# Patient Record
Sex: Male | Born: 1960 | Race: Black or African American | Hispanic: No | Marital: Married | Smoking: Never smoker
Health system: Southern US, Community
[De-identification: ages and names within clinical notes are randomized; demographics above are authoritative.]

## PROBLEM LIST (undated history)

## (undated) DIAGNOSIS — G20 Parkinson's disease: Secondary | ICD-10-CM

## (undated) DIAGNOSIS — R112 Nausea with vomiting, unspecified: Secondary | ICD-10-CM

## (undated) DIAGNOSIS — Z9889 Other specified postprocedural states: Secondary | ICD-10-CM

## (undated) DIAGNOSIS — Z87442 Personal history of urinary calculi: Secondary | ICD-10-CM

## (undated) DIAGNOSIS — F419 Anxiety disorder, unspecified: Secondary | ICD-10-CM

## (undated) HISTORY — PX: COLONOSCOPY: SHX174

## (undated) HISTORY — PX: UPPER GASTROINTESTINAL ENDOSCOPY: SHX188

## (undated) HISTORY — PX: OTHER SURGICAL HISTORY: SHX169

## (undated) HISTORY — DX: Parkinson's disease: G20

---

## 2013-09-10 DIAGNOSIS — G20A1 Parkinson's disease without dyskinesia, without mention of fluctuations: Secondary | ICD-10-CM

## 2013-09-10 DIAGNOSIS — G20 Parkinson's disease: Secondary | ICD-10-CM

## 2013-09-10 HISTORY — DX: Parkinson's disease: G20

## 2013-09-10 HISTORY — DX: Parkinson's disease without dyskinesia, without mention of fluctuations: G20.A1

## 2013-09-11 ENCOUNTER — Ambulatory Visit: Attending: Neurology | Admitting: Physical Therapy

## 2013-09-11 DIAGNOSIS — IMO0001 Reserved for inherently not codable concepts without codable children: Secondary | ICD-10-CM | POA: Insufficient documentation

## 2013-09-11 DIAGNOSIS — R269 Unspecified abnormalities of gait and mobility: Secondary | ICD-10-CM | POA: Insufficient documentation

## 2013-09-16 ENCOUNTER — Ambulatory Visit: Admitting: Physical Therapy

## 2013-09-18 ENCOUNTER — Ambulatory Visit: Attending: Neurology | Admitting: Physical Therapy

## 2013-09-18 DIAGNOSIS — R269 Unspecified abnormalities of gait and mobility: Secondary | ICD-10-CM | POA: Insufficient documentation

## 2013-09-18 DIAGNOSIS — IMO0001 Reserved for inherently not codable concepts without codable children: Secondary | ICD-10-CM | POA: Insufficient documentation

## 2013-09-19 ENCOUNTER — Ambulatory Visit: Admitting: Physical Therapy

## 2013-09-23 ENCOUNTER — Ambulatory Visit: Admitting: Occupational Therapy

## 2013-09-23 ENCOUNTER — Ambulatory Visit: Admitting: Physical Therapy

## 2013-09-24 ENCOUNTER — Ambulatory Visit: Admitting: Physical Therapy

## 2013-09-24 ENCOUNTER — Ambulatory Visit: Admitting: Occupational Therapy

## 2013-09-25 ENCOUNTER — Ambulatory Visit: Admitting: Physical Therapy

## 2013-09-26 ENCOUNTER — Ambulatory Visit: Admitting: Occupational Therapy

## 2013-09-27 ENCOUNTER — Ambulatory Visit: Admitting: Physical Therapy

## 2013-09-30 ENCOUNTER — Ambulatory Visit: Admitting: Physical Therapy

## 2013-10-01 ENCOUNTER — Ambulatory Visit: Admitting: Rehabilitative and Restorative Service Providers"

## 2013-10-02 ENCOUNTER — Ambulatory Visit: Admitting: Physical Therapy

## 2013-10-02 ENCOUNTER — Other Ambulatory Visit: Payer: Self-pay

## 2013-10-02 ENCOUNTER — Ambulatory Visit: Admitting: Occupational Therapy

## 2013-10-03 ENCOUNTER — Ambulatory Visit: Admitting: Physical Therapy

## 2013-10-04 ENCOUNTER — Ambulatory Visit: Admitting: Occupational Therapy

## 2013-10-07 ENCOUNTER — Ambulatory Visit: Admitting: Occupational Therapy

## 2013-10-07 ENCOUNTER — Ambulatory Visit: Admitting: Physical Therapy

## 2013-10-09 ENCOUNTER — Ambulatory Visit: Admitting: Physical Therapy

## 2013-10-09 ENCOUNTER — Ambulatory Visit: Admitting: Occupational Therapy

## 2013-10-10 ENCOUNTER — Ambulatory Visit: Admitting: Physical Therapy

## 2013-10-10 ENCOUNTER — Ambulatory Visit: Admitting: Occupational Therapy

## 2013-10-11 ENCOUNTER — Ambulatory Visit: Admitting: Physical Therapy

## 2013-10-14 ENCOUNTER — Ambulatory Visit: Admitting: Occupational Therapy

## 2013-10-14 ENCOUNTER — Ambulatory Visit: Admitting: Physical Therapy

## 2013-10-16 ENCOUNTER — Ambulatory Visit: Admitting: Occupational Therapy

## 2013-10-16 ENCOUNTER — Ambulatory Visit: Admitting: Physical Therapy

## 2013-10-18 ENCOUNTER — Encounter: Payer: Self-pay | Admitting: Neurology

## 2013-10-18 ENCOUNTER — Ambulatory Visit (INDEPENDENT_AMBULATORY_CARE_PROVIDER_SITE_OTHER): Admitting: Neurology

## 2013-10-18 ENCOUNTER — Ambulatory Visit: Admitting: Physical Therapy

## 2013-10-18 ENCOUNTER — Ambulatory Visit: Admitting: Occupational Therapy

## 2013-10-18 VITALS — BP 122/78 | HR 80 | Temp 98.3°F | Resp 12 | Ht 74.0 in | Wt 174.4 lb

## 2013-10-18 DIAGNOSIS — G20A1 Parkinson's disease without dyskinesia, without mention of fluctuations: Secondary | ICD-10-CM

## 2013-10-18 DIAGNOSIS — G20 Parkinson's disease: Secondary | ICD-10-CM | POA: Insufficient documentation

## 2013-10-18 DIAGNOSIS — R259 Unspecified abnormal involuntary movements: Secondary | ICD-10-CM

## 2013-10-18 DIAGNOSIS — R251 Tremor, unspecified: Secondary | ICD-10-CM

## 2013-10-18 MED ORDER — CARBIDOPA-LEVODOPA 25-100 MG PO TABS: 1.0000 | ORAL_TABLET | Freq: Three times a day (TID) | ORAL | Status: DC

## 2013-10-18 NOTE — Progress Notes (Signed)
Ian [name] was seen today in the movement disorders clinic for neurologic consultation at the request of his PCP, Dr. Drue Second.  He has previously seen  [facility] MD and saw [name] in [place].   This patient is accompanied in the office by his spouse who supplements the history.The consultation is for the evaluation of PD.  I did review Dr. [name] notes.  Dr. Hurley Cisco notes are not available to me.    Hist first sx was tremor was R arm tremor.  It was in 2011 according to the patient today, but Dr. [name] notes indicate that perhaps this was in 2009.  He was dx with enhanced physiologic tremor.  He was started on propranolol with some help, but made him sluggish.  He went to see [name] in 2013 for an opinion in [place].  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  He was placed on baclofen and requip.  He thinks that the requip has helped; he is able to turn over in the bed better and move better.  The pt first saw Dr. [name] on 09/10/13.  Dr. [name] agreed with the Requip and start the patient on [name], but the patient is currently only on its once per day.  Specific Symptoms:  Tremor: yes, R arm x 3 years, L foot x 1 year Voice: hoarse with nervousness Sleep: sleeps well  Vivid Dreams:  yes  Acting out dreams:  no Wet Pillows: no Postural symptoms:  no  Falls?  no Bradykinesia symptoms: shuffling gait and slow movements Loss of smell:  no Loss of taste:  no Urinary Incontinence:  no Difficulty Swallowing:  no Handwriting, micrographia: no Trouble with ADL's:  yes  Trouble buttoning clothing: yes Depression:  yes (some in past, but better now) Memory changes:  no Hallucinations:  no  visual distortions: no N/V:  no Lightheaded:  no  Syncope: no Diplopia:  no Dyskinesia:  no  Neuroimaging has previously been performed.  It is not  available for my review today.  I did ask the patient to get me.  PREVIOUS MEDICATIONS: Requip and neupro (tried few days only); propranolol helped tremor some  ALLERGIES:  No Known Allergies  CURRENT MEDICATIONS:     Medication List       This list is accurate as of: 10/18/13  4:54 PM.  Always use your most recent med list.               baclofen 10 MG tablet  Commonly known as:  LIORESAL  10 mg.     carbidopa-levodopa 25-100 MG per tablet  Commonly known as:  SINEMET  Take 1 tablet by mouth 3 (three) times daily.     Co Q-10 100 MG Caps  100 mg.     D-5000 5000 UNITS Tabs  Generic drug:  Cholecalciferol  1 tablet.     MULTI-VITAMINS Tabs  1 tablet.     rOPINIRole 0.25 MG tablet  Commonly known as:  REQUIP  6 mg daily.     valACYclovir 1000 MG tablet  Commonly known as:  VALTREX  1,000 mg.         PAST MEDICAL HISTORY:   Past Medical History  Diagnosis Date  . PD (Parkinson's disease) 09/10/2013    PAST SURGICAL HISTORY:   Past Surgical History  Procedure Laterality Date  . None      SOCIAL HISTORY:  History   Social History  . Marital Status: Married    Spouse Name: N/A    Number of Children: N/A  . Years of Education: N/A   Occupational History  . Not on file.   Social History Main Topics  . Smoking status: Never Smoker   . Smokeless tobacco: Never Used  . Alcohol Use: Yes     Comment: Occ  . Drug Use: Not on file  . Sexual Activity: Not on file   Other Topics Concern  . Not on file   Social History Narrative  . No narrative on file    FAMILY HISTORY:   Family Status  Relation Status Death Age  . Mother Deceased 53    Breast Cancer  . Father Alive     Diabetes  . Sister Alive     healthy  . Daughter Alive     2, twins  . Son Alive     ROS:  -15-20 lb weight loss over last few years.  A complete 10 system review of systems was obtained and was unremarkable apart from what is mentioned above.  PHYSICAL  EXAMINATION:    VITALS:   Filed Vitals:   10/18/13 1419  BP: 122/78  Pulse: 80  Temp: 98.3 F (36.8 C)  Resp: 12  Height: 6\' 2"  (1.88 m)  Weight: 174 lb 6.4 oz (79.107 kg)    GEN:  The patient appears stated age and is in NAD. HEENT:  Normocephalic, atraumatic.  The mucous membranes are moist. The superficial temporal arteries are without ropiness or tenderness. CV:  RRR Lungs:  CTAB Neck/HEME:  There are no carotid bruits bilaterally.  Neurological examination:  Orientation: The patient is alert and oriented x3. Fund of knowledge is appropriate.  Recent and remote memory are intact.  Attention and concentration are normal.    Able to name objects and repeat phrases. Cranial nerves: There is good facial symmetry.  There is facial hypomimia.  Pupils are equal round and reactive to light bilaterally. Fundoscopic exam reveals clear margins bilaterally. Extraocular muscles are intact. The visual fields are full to confrontational testing. The speech is fluent and clear. Soft palate rises symmetrically and there is no tongue deviation. Hearing is intact to conversational tone. Sensation: Sensation is intact to light and pinprick throughout (facial, trunk, extremities). Vibration is intact at the bilateral big toe. There is no extinction with double simultaneous stimulation. There is no sensory dermatomal level identified. Motor: Strength is 5/5 in the bilateral upper and lower extremities.   Shoulder shrug is equal and symmetric.  There is no pronator drift. Deep tendon reflexes: Deep tendon reflexes are 2-/4 at the bilateral biceps, triceps, brachioradialis, patella and achilles. Plantar responses are downgoing bilaterally.  Movement examination: Tone: There is moderate to severe rigidity in the upper extremities, left greater than right.  Tone was moderately increased in the bilateral lower extremities.  Abnormal movements: There is a near constant resting tremor of the right upper  extremity.  There is a fairly consistent tremor of the left lower extremity an intermittent resting tremor of the left upper extremity. Coordination:  There is  decremation with RAM's, seen primarily in the upper extremities with hand opening and closing and finger taps.  The lower extremities were good. Gait and Station: The patient has minimal difficulty arising out of a deep-seated chair without the use of the hands. The patient's stride length is fairly good (patient is currently in rehabilitation and states that he has worked on  this).  There is decreased arm swing bilaterally.  The patient has a negative pull test.      ASSESSMENT/PLAN:  1.  Parkinsonism.  I suspect that this does represent idiopathic Parkinson's disease.  The patient has tremor, bradykinesia, rigidity and mild postural instability.  -We discussed the diagnosis as well as pathophysiology of the disease.  We discussed treatment options as well as prognostic indicators.  Patient education was provided.  -Greater than 50% of the 80 minute visit was spent in counseling answering questions and talking about what to expect now as well as in the future.  We talked about medication options as well as potential future surgical options.  We talked about safety in the home.  -I discussed with him that I agree with Dr. [name] approach and I think that he needs an increase the dose of levodopa.  I will switch him to the immediate release formulation and have him take it 30 minutes before each meal.  Risks, benefits, side effects and alternative therapies were discussed.  The opportunity to ask questions was given and they were answered to the best of my ability.  The patient expressed understanding and willingness to follow the outlined treatment protocols.  -I did tell the pt that I think that he will likely need a higher levodopa dose that this but it is a good starting dose.  -The patient will remain on [name], 6 mg daily.  -If we're  able to get some of this rigidity under better control then perhaps we can use a pure anticholinergic medication like Artane for tremor.  -I stressed the importance of exercise.  -I. asked the patient to get me a copy of his MRI of the brain from [place].

## 2013-10-21 ENCOUNTER — Ambulatory Visit: Attending: Neurology | Admitting: Physical Therapy

## 2013-10-21 ENCOUNTER — Ambulatory Visit: Admitting: Occupational Therapy

## 2013-10-21 DIAGNOSIS — R269 Unspecified abnormalities of gait and mobility: Secondary | ICD-10-CM | POA: Insufficient documentation

## 2013-10-21 DIAGNOSIS — IMO0001 Reserved for inherently not codable concepts without codable children: Secondary | ICD-10-CM | POA: Insufficient documentation

## 2013-10-23 ENCOUNTER — Ambulatory Visit: Admitting: Physical Therapy

## 2013-10-23 ENCOUNTER — Ambulatory Visit: Admitting: Occupational Therapy

## 2013-10-24 ENCOUNTER — Ambulatory Visit: Admitting: Occupational Therapy

## 2013-10-24 ENCOUNTER — Ambulatory Visit: Admitting: Physical Therapy

## 2013-10-28 ENCOUNTER — Ambulatory Visit: Admitting: Occupational Therapy

## 2013-10-28 ENCOUNTER — Ambulatory Visit: Admitting: Physical Therapy

## 2013-10-30 ENCOUNTER — Ambulatory Visit: Admitting: Physical Therapy

## 2013-10-30 ENCOUNTER — Ambulatory Visit: Admitting: Occupational Therapy

## 2013-10-30 ENCOUNTER — Encounter: Admitting: Occupational Therapy

## 2013-10-31 ENCOUNTER — Ambulatory Visit: Admitting: Physical Therapy

## 2013-10-31 ENCOUNTER — Ambulatory Visit: Admitting: Occupational Therapy

## 2013-11-05 ENCOUNTER — Ambulatory Visit: Admitting: Occupational Therapy

## 2013-11-05 ENCOUNTER — Ambulatory Visit: Admitting: Physical Therapy

## 2013-11-07 ENCOUNTER — Ambulatory Visit: Admitting: Physical Therapy

## 2013-11-11 ENCOUNTER — Ambulatory Visit: Admitting: Physical Therapy

## 2013-11-12 ENCOUNTER — Ambulatory Visit: Admitting: Occupational Therapy

## 2013-11-18 ENCOUNTER — Ambulatory Visit: Attending: Neurology | Admitting: Occupational Therapy

## 2013-11-18 DIAGNOSIS — R269 Unspecified abnormalities of gait and mobility: Secondary | ICD-10-CM | POA: Insufficient documentation

## 2013-11-18 DIAGNOSIS — IMO0001 Reserved for inherently not codable concepts without codable children: Secondary | ICD-10-CM | POA: Insufficient documentation

## 2013-11-21 ENCOUNTER — Ambulatory Visit: Admitting: Occupational Therapy

## 2013-12-04 ENCOUNTER — Ambulatory Visit (INDEPENDENT_AMBULATORY_CARE_PROVIDER_SITE_OTHER): Admitting: Neurology

## 2013-12-04 ENCOUNTER — Encounter: Payer: Self-pay | Admitting: Neurology

## 2013-12-04 VITALS — BP 118/70 | HR 80 | Temp 98.2°F | Resp 16 | Ht 74.0 in | Wt 174.8 lb

## 2013-12-04 DIAGNOSIS — G20 Parkinson's disease: Secondary | ICD-10-CM

## 2013-12-04 MED ORDER — CARBIDOPA-LEVODOPA 25-100 MG PO TABS: 2.0000 | ORAL_TABLET | Freq: Three times a day (TID) | ORAL | Status: DC

## 2013-12-04 NOTE — Progress Notes (Signed)
Ian [name] was seen today in the movement disorders clinic for neurologic consultation at the request of his PCP, Dr. Drue Second.  He has previously seen  [facility], MD and saw [name] in [place].   This patient is accompanied in the office by his spouse who supplements the history.The consultation is for the evaluation of PD.  I did review Dr. [name] notes.  Dr. Hurley Cisco notes are not available to me.    Hist first sx was tremor was R arm tremor.  It was in 2011 according to the patient today, but Dr. [name] notes indicate that perhaps this was in 2009.  He was dx with enhanced physiologic tremor.  He was started on propranolol with some help, but made him sluggish.  He went to see [name] in 2013 for an opinion in [place].  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  He was placed on baclofen and requip.  He thinks that the requip has helped; he is able to turn over in the bed better and move better.  The pt first saw Dr. [name] on 09/10/13.  Dr. [name] agreed with the Requip and start the patient on [name], but the patient is currently only on its once per day.  12/04/13 update:  The pt presents to the clinic today for his PD.  He is exercising faithfully.  He attends the PD exercise class through the rehab center. The pt was changed to the IR formulation of carbidopa/levodopa 25/100 last visit and is on it tid.  He is not sure it helps.  Afternoons are better than mornings.  carbidopa/levodopa 25/100 is taken at 8am/3:30 pm and bedtime. He is also on requip xl 6 mg daily.  Continues to have significant tremor.   Neuroimaging has previously been performed.  It is not available for my review today.  I did ask the patient to get me.  PREVIOUS MEDICATIONS: Requip and neupro (tried few days only); propranolol helped tremor some  ALLERGIES:  No Known  Allergies  CURRENT MEDICATIONS:     Medication List       This list is accurate as of: 12/04/13  4:10 PM.  Always use your most recent med list.               carbidopa-levodopa 25-100 MG per tablet  Commonly known as:  SINEMET  Take 1 tablet by mouth 3 (three) times daily.     CITICOLINE SODIUM PO  Take by mouth.     Co Q-10 100 MG Caps  100 mg.     D-5000 5000 UNITS Tabs  Generic drug:  Cholecalciferol  1 tablet.     MULTI-VITAMINS Tabs  1 tablet.     niacin 500 MG tablet  Take 5,000 mg by mouth at bedtime.     valACYclovir 1000 MG tablet  Commonly known as:  VALTREX  1,000 mg.     vitamin E 400 UNIT capsule  Take 400 Units by mouth 3 (three) times daily.         PAST MEDICAL HISTORY:   Past Medical History  Diagnosis Date  . PD (Parkinson's disease) 09/10/2013    PAST SURGICAL HISTORY:   Past Surgical History  Procedure Laterality Date  . None      SOCIAL HISTORY:   History   Social History  . Marital Status: Married    Spouse Name: N/A  Number of Children: N/A  . Years of Education: N/A   Occupational History  . Not on file.   Social History Main Topics  . Smoking status: Never Smoker   . Smokeless tobacco: Never Used  . Alcohol Use: Yes     Comment: Occ  . Drug Use: Not on file  . Sexual Activity: Not on file   Other Topics Concern  . Not on file   Social History Narrative  . No narrative on file    FAMILY HISTORY:   Family Status  Relation Status Death Age  . Mother Deceased 18    Breast Cancer  . Father Alive     Diabetes  . Sister Alive     healthy  . Daughter Alive     2, twins  . Son Alive     ROS:  -15-20 lb weight loss over last few years. Stable from last visit.  A complete 10 system review of systems was obtained and was unremarkable apart from what is mentioned above.  PHYSICAL EXAMINATION:    VITALS:   Filed Vitals:   12/04/13 1523  BP: 118/70  Pulse: 80  Temp: 98.2 F (36.8 C)  Resp: 16   Height: 6\' 2"  (1.88 m)  Weight: 174 lb 12.8 oz (79.289 kg)   Wt Readings from Last 3 Encounters:  12/04/13 174 lb 12.8 oz (79.289 kg)  10/18/13 174 lb 6.4 oz (79.107 kg)    GEN:  The patient appears stated age and is in NAD. HEENT:  Normocephalic, atraumatic.  The mucous membranes are moist. The superficial temporal arteries are without ropiness or tenderness. CV:  RRR Lungs:  CTAB Neck/HEME:  There are no carotid bruits bilaterally.  Neurological examination:  Orientation: The patient is alert and oriented x3. Fund of knowledge is appropriate.  Recent and remote memory are intact.  Attention and concentration are normal.    Able to name objects and repeat phrases. Cranial nerves: There is good facial symmetry.  There is facial hypomimia.  Pupils are equal round and reactive to light bilaterally. The speech is fluent and clear. Soft palate rises symmetrically and there is no tongue deviation. Hearing is intact to conversational tone. Sensation: Sensation is intact to light touch throughout. Motor: Strength is 5/5 in the bilateral upper and lower extremities.   Shoulder shrug is equal and symmetric.  There is no pronator drift.   Movement examination: Tone: There is moderate to severe rigidity in the upper extremities, left greater than right.  Tone was moderately increased in the bilateral lower extremities.  Abnormal movements: There is an intermittent resting tremor of the right upper extremity.  There is a fairly consistent tremor of the left lower extremity Coordination:  There is  decremation with RAM's, seen primarily with toe taps/heel taps today bilaterally.  The lower extremities were good. Gait and Station: The patient has no difficulty arising out of a deep-seated chair without the use of the hands. The patient's stride length is fairly good (patient is currently in rehabilitation and states that he has worked on this).  There is decreased arm swing bilaterally.  The patient  has a negative pull test.      ASSESSMENT/PLAN:  1. idiopathic Parkinson's disease.  The patient has tremor, bradykinesia, rigidity and mild postural instability.  -We discussed the diagnosis as well as pathophysiology of the disease.  We discussed treatment options as well as prognostic indicators.  Patient education was provided.  -Levodopa will be increased to 2 tablets  tid and dosages will be moved closer together.  I would like to see him next visit not long after a dose is taken.  -The patient will remain on [name], 6 mg daily.  -If we're able to get some of this rigidity under better control then perhaps we can use a pure anticholinergic medication like Artane for tremor.  -I stressed the importance of exercise.   He is doing this faithfully.  -DBS was again discussed.  -Handicap placard was filled out.

## 2013-12-04 NOTE — Patient Instructions (Signed)
1.  Increase your carbidopa/levodopa 25/100 as follows:  2 at 8am, 1 at noon, 1 at 5 pm (prior to meals) for a week, then 2 at 8am, 2 at noon, 1 at 5 pm for a week and then 2 tablets three times per day thereafter

## 2014-01-27 ENCOUNTER — Encounter: Payer: Self-pay | Admitting: Neurology

## 2014-01-27 ENCOUNTER — Ambulatory Visit (INDEPENDENT_AMBULATORY_CARE_PROVIDER_SITE_OTHER): Admitting: Neurology

## 2014-01-27 VITALS — BP 110/78 | HR 72 | Temp 97.9°F | Ht 74.02 in | Wt 173.2 lb

## 2014-01-27 DIAGNOSIS — G4752 REM sleep behavior disorder: Secondary | ICD-10-CM

## 2014-01-27 DIAGNOSIS — G20 Parkinson's disease: Secondary | ICD-10-CM

## 2014-01-27 MED ORDER — ROPINIROLE HCL ER 4 MG PO TB24: 4.0000 mg | ORAL_TABLET | Freq: Two times a day (BID) | ORAL | Status: DC

## 2014-01-27 NOTE — Progress Notes (Signed)
Ian [name] was seen today in the movement disorders clinic for neurologic consultation at the request of his PCP, Dr. Drue Second.  He has previously seen  [facility], MD and saw [name] in [place].   This patient is accompanied in the office by his spouse who supplements the history.The consultation is for the evaluation of PD.  I did review Dr. [name] notes.  Dr. Hurley Cisco notes are not available to me.    Hist first sx was tremor was R arm tremor.  It was in 2011 according to the patient today, but Dr. [name] notes indicate that perhaps this was in 2009.  He was dx with enhanced physiologic tremor.  He was started on propranolol with some help, but made him sluggish.  He went to see [name] in 2013 for an opinion in [place].  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  He was placed on baclofen and requip.  He thinks that the requip has helped; he is able to turn over in the bed better and move better.  The pt first saw Dr. [name] on 09/10/13.  Dr. [name] agreed with the Requip and start the patient on [name], but the patient is currently only on its once per day.  12/04/13 update:  The pt presents to the clinic today for his PD.  He is exercising faithfully.  He attends the PD exercise class through the rehab center. The pt was changed to the IR formulation of carbidopa/levodopa 25/100 last visit and is on it tid.  He is not sure it helps.  Afternoons are better than mornings.  carbidopa/levodopa 25/100 is taken at 8am/3:30 pm and bedtime. He is also on requip xl 6 mg daily.  Continues to have significant tremor.  01/27/14 update:  The patient presents today with his wife, who supplements the history.  The pt has a hx of PD and last visit, we decided to increase his carbidopa/levodopa to 2 po tid.  the patient states that he just felt lightheaded on this medication  dosage and up going back to [name] 3 times per day and then takes [name], half tablet twice per day. He remains on requip xl 6 mg daily.  Overall, he does feel fairly good.  He still has tremor.  He went to see Dr. [name] since our last visit and again talked about DBS therapy.  Dr. [name] mentioned to him that he looks much better and he had previously and had [name] score had declined.  Some yelling out in the sleep but this has not been a huge issue.  No sleepwalking/talking.  No falls.  He is exercising faithfully.   Neuroimaging has previously been performed.  It is not available for my review today.  I did ask the patient to get me.  PREVIOUS MEDICATIONS: Requip and neupro (tried few days only); propranolol helped tremor some  ALLERGIES:  No Known Allergies  CURRENT MEDICATIONS:     Medication List       This list is accurate as of: 01/27/14 12:57 PM.  Always use your most recent med list.               carbidopa-levodopa 25-100 MG per tablet  Commonly known as:  SINEMET  Take 2 tablets by mouth 3 (three) times daily.     CITICOLINE SODIUM PO  Take by mouth.     Co  Q-10 100 MG Caps  100 mg.     D-5000 5000 UNITS Tabs  Generic drug:  Cholecalciferol  1 tablet.     MULTI-VITAMINS Tabs  1 tablet.     niacin 500 MG tablet  Take 5,000 mg by mouth at bedtime.     rOPINIRole 2 MG 24 hr tablet  Commonly known as:  [name]  Take 6 mg by mouth at bedtime.     valACYclovir 1000 MG tablet  Commonly known as:  VALTREX  1,000 mg.     vitamin E 400 UNIT capsule  Take 400 Units by mouth 3 (three) times daily.         PAST MEDICAL HISTORY:   Past Medical History  Diagnosis Date  . PD (Parkinson's disease) 09/10/2013    PAST SURGICAL HISTORY:   Past Surgical History  Procedure Laterality Date  . None      SOCIAL HISTORY:   History   Social History  . Marital Status: Married    Spouse Name: N/A    Number of  Children: N/A  . Years of Education: N/A   Occupational History  . Not on file.   Social History Main Topics  . Smoking status: Never Smoker   . Smokeless tobacco: Never Used  . Alcohol Use: Yes     Comment: Occ  . Drug Use: Not on file  . Sexual Activity: Not on file   Other Topics Concern  . Not on file   Social History Narrative  . No narrative on file    FAMILY HISTORY:   Family Status  Relation Status Death Age  . Mother Deceased 1857    Breast Cancer  . Father Alive     Diabetes  . Sister Alive     healthy  . Daughter Alive     2, twins  . Son Alive     ROS:  -15-20 lb weight loss over last few years. Stable from last visit.  A complete 10 system review of systems was obtained and was unremarkable apart from what is mentioned above.  PHYSICAL EXAMINATION:    VITALS:   There were no vitals filed for this visit. Wt Readings from Last 3 Encounters:  12/04/13 174 lb 12.8 oz (79.289 kg)  10/18/13 174 lb 6.4 oz (79.107 kg)    GEN:  The patient appears stated age and is in NAD. HEENT:  Normocephalic, atraumatic.  The mucous membranes are moist. The superficial temporal arteries are without ropiness or tenderness. CV:  RRR Lungs:  CTAB Neck/HEME:  There are no carotid bruits bilaterally.  Neurological examination:  Orientation: The patient is alert and oriented x3. Fund of knowledge is appropriate.  Recent and remote memory are intact.  Attention and concentration are normal.    Able to name objects and repeat phrases. Cranial nerves: There is good facial symmetry.  There is facial hypomimia.  Pupils are equal round and reactive to light bilaterally. The speech is fluent and clear. Soft palate rises symmetrically and there is no tongue deviation. Hearing is intact to conversational tone. Sensation: Sensation is intact to light touch throughout. Motor: Strength is 5/5 in the bilateral upper and lower extremities.   Shoulder shrug is equal and symmetric.  There is  no pronator drift.   Movement examination: Tone: There is mild to moderate rigidity bilaterally.  This is actually a great improvement.  Tone in the lower extremities was only mildly increased, again an improvement.   Abnormal movements: There is  an intermittent resting tremor bilaterally. Coordination:  There is  decremation with RAM's, seen primarily with toe taps/heel taps today bilaterally.  The lower extremities were good. Gait and Station: The patient has no difficulty arising out of a deep-seated chair without the use of the hands. The patient's stride length is fairly good There is decreased arm swing bilaterally.  The patient has a negative pull test.      ASSESSMENT/PLAN:  1. idiopathic Parkinson's disease.  The patient has tremor, bradykinesia, rigidity and mild postural instability.  -We discussed the diagnosis as well as pathophysiology of the disease.  We discussed treatment options as well as prognostic indicators.  Patient education was provided.  -I am going to increase his [name] from 6 mg daily to 4 mg twice a day.  He feels that it really has worn off and his wife feels that the tremors much of the night.  -He is on strange combination of levodopa, but he to stay on that for now.  He will remain on [name], one tablet 3 times a day and [name], half tablet twice a day.  We discussed [name] and Duopa and the role that they play in PD management.  I think he may be a good candidate for [name] in the near future.  -DBS was again discussed in detai greater than 50% of the 40 minute visit in counseling regarding DBS, risks and benefits and providing patient education.  -Followup in the next few months, sooner should new neurologic issues arise.

## 2014-04-07 ENCOUNTER — Encounter: Payer: Self-pay | Admitting: Neurology

## 2014-04-09 ENCOUNTER — Encounter: Payer: Self-pay | Admitting: Neurology

## 2014-04-09 ENCOUNTER — Telehealth: Payer: Self-pay | Admitting: Neurology

## 2014-04-09 NOTE — Telephone Encounter (Signed)
Pt returning your call please call [phone]

## 2014-04-09 NOTE — Telephone Encounter (Signed)
Called patient about disability forms. Awaiting call back.

## 2014-04-09 NOTE — Telephone Encounter (Signed)
Spoke to patient. Received previous forms and forms completed. Patient aware will pick up completed forms tomorrow. [facility].

## 2014-04-10 DIAGNOSIS — Z0279 Encounter for issue of other medical certificate: Secondary | ICD-10-CM

## 2014-05-27 ENCOUNTER — Ambulatory Visit: Attending: Neurology | Admitting: Physical Therapy

## 2014-05-27 ENCOUNTER — Ambulatory Visit: Admitting: Physical Therapy

## 2014-05-27 ENCOUNTER — Ambulatory Visit: Admitting: Occupational Therapy

## 2014-05-27 DIAGNOSIS — IMO0001 Reserved for inherently not codable concepts without codable children: Secondary | ICD-10-CM | POA: Insufficient documentation

## 2014-05-27 DIAGNOSIS — R269 Unspecified abnormalities of gait and mobility: Secondary | ICD-10-CM | POA: Insufficient documentation

## 2014-05-28 ENCOUNTER — Ambulatory Visit (INDEPENDENT_AMBULATORY_CARE_PROVIDER_SITE_OTHER): Admitting: Neurology

## 2014-05-28 ENCOUNTER — Encounter: Payer: Self-pay | Admitting: Neurology

## 2014-05-28 VITALS — BP 108/60 | HR 72 | Resp 16 | Ht 74.0 in | Wt 166.0 lb

## 2014-05-28 DIAGNOSIS — R259 Unspecified abnormal involuntary movements: Secondary | ICD-10-CM

## 2014-05-28 DIAGNOSIS — R251 Tremor, unspecified: Secondary | ICD-10-CM

## 2014-05-28 DIAGNOSIS — G20 Parkinson's disease: Secondary | ICD-10-CM

## 2014-05-28 DIAGNOSIS — G20A1 Parkinson's disease without dyskinesia, without mention of fluctuations: Secondary | ICD-10-CM

## 2014-05-28 MED ORDER — CARBIDOPA-LEVODOPA ER 50-200 MG PO TBCR: 1.0000 | EXTENDED_RELEASE_TABLET | Freq: Every day | ORAL | Status: DC

## 2014-05-28 NOTE — Patient Instructions (Addendum)
1. Start Carbidopa Levodopa 50/200 at bedtime. This prescription has been sent to your pharmacy.  2. [facility] will call you with an appt for the massage therapy. Nutrition is not covered by your insurance here. They can be reached at [phone]. 3. You have been referred to [facility]. They will call you directly to schedule an appointment.  Please call [phone] if you do not hear from them.  4. We will call you about speaking with a patient DBS representative.  5.Our nutrition department at [facility] will call you with an appointment.

## 2014-05-28 NOTE — Progress Notes (Signed)
Ian [name] was seen today in the movement disorders clinic for neurologic consultation at the request of his PCP, Dr. Drue Second.  He has previously seen  [name], [place] and saw [name] in [place].   This patient is accompanied in the office by his spouse who supplements the history.The consultation is for the evaluation of PD.  I did review Dr. [name] notes.  Dr. Hurley Cisco notes are not available to me.    Hist first sx was tremor was R arm tremor.  It was in 2011 according to the patient today, but Dr. [name] notes indicate that perhaps this was in 2009.  He was dx with enhanced physiologic tremor.  He was started on propranolol with some help, but made him sluggish.  He went to see [name] in 2013 for an opinion in [place].  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  He was placed on baclofen and requip.  He thinks that the requip has helped; he is able to turn over in the bed better and move better.  The pt first saw Dr. [name] on 09/10/13.  Dr. [name] agreed with the Requip and start the patient on [name], but the patient is currently only on its once per day.  12/04/13 update:  The pt presents to the clinic today for his PD.  He is exercising faithfully.  He attends the PD exercise class through the rehab center. The pt was changed to the IR formulation of carbidopa/levodopa 25/100 last visit and is on it tid.  He is not sure it helps.  Afternoons are better than mornings.  carbidopa/levodopa 25/100 is taken at 8am/3:30 pm and bedtime. He is also on requip xl 6 mg daily.  Continues to have significant tremor.  01/27/14 update:  The patient presents today with his wife, who supplements the history.  The pt has a hx of PD and last visit, we decided to increase his carbidopa/levodopa to 2 po tid.  the patient states that he just felt lightheaded on this medication  dosage and up going back to [name] 3 times per day and then takes [name], half tablet twice per day. He remains on requip xl 6 mg daily.  Overall, he does feel fairly good.  He still has tremor.  He went to see Dr. [name] since our last visit and again talked about DBS therapy.  Dr. [name] mentioned to him that he looks much better and he had previously and had [name] score had declined.  Some yelling out in the sleep but this has not been a huge issue.  No sleepwalking/talking.  No falls.  He is exercising faithfully.  05/28/14 update:  The patient is presenting today for followup.  Last visit, I increased his [name] from 6 mg daily to 4 mg twice a day.  He is doing well with this.  Tremor is about the same.  He is on a strange combination of carbidopa levodopa, but when I tried to switch him off the IR version, the patient just felt lightheaded and ended up going back to a combination of IR and CR.  He is currently on 25/100 CR 3 times per day and then takes [name], half tablet twice per day and sometimes three times per day.  He does best in the evening and worst in the AM.   No swallowing troubles.  Has dropped  weight but thinks that it is exercise and was trying to follow gluten free diet. He is walking, doing a spinning class for PD, and has started golfing again, which he hasn't done in years.   Still considering DBS therapy.     Neuroimaging has previously been performed.  It is not available for my review today.  I did ask the patient to get me.  PREVIOUS MEDICATIONS: Requip and neupro (tried few days only); propranolol helped tremor some  ALLERGIES:  No Known Allergies  CURRENT MEDICATIONS:     Medication List       This list is accurate as of: 05/28/14 10:16 AM.  Always use your most recent med list.               Co Q-10 100 MG Caps  Take 100 mg by mouth daily.     D-5000 5000 UNITS Tabs  Generic drug:   Cholecalciferol  Take 1 tablet by mouth daily.     GLUTATHIONE PO  Take by mouth.     MAGNESIUM PO  Take by mouth daily.     MULTI-VITAMINS Tabs  Take 1 tablet by mouth daily.     niacin 500 MG tablet  Take 5,000 mg by mouth at bedtime.     rOPINIRole 4 MG 24 hr tablet  Commonly known as:  [name]  Take 1 tablet (4 mg total) by mouth 2 (two) times daily.     SINEMET CR 25-100 MG tablet controlled release  Generic drug:  Carbidopa-Levodopa ER  Take 1 tablet by mouth 3 (three) times daily.     carbidopa-levodopa 25-100 MG per tablet  Commonly known as:  SINEMET IR  Take 0.5 tablets by mouth daily. At noon     VITAMIN C PO  Take by mouth daily.     vitamin E 400 UNIT capsule  Take 400 Units by mouth 3 (three) times daily.         PAST MEDICAL HISTORY:   Past Medical History  Diagnosis Date  . PD (Parkinson's disease) 09/10/2013    PAST SURGICAL HISTORY:   Past Surgical History  Procedure Laterality Date  . None      SOCIAL HISTORY:   History   Social History  . Marital Status: Married    Spouse Name: N/A    Number of Children: N/A  . Years of Education: N/A   Occupational History  . Not on file.   Social History Main Topics  . Smoking status: Never Smoker   . Smokeless tobacco: Never Used  . Alcohol Use: Yes     Comment: Occ  . Drug Use: Not on file  . Sexual Activity: Not on file   Other Topics Concern  . Not on file   Social History Narrative  . No narrative on file    FAMILY HISTORY:   Family Status  Relation Status Death Age  . Mother Deceased 67    Breast Cancer  . Father Alive     Diabetes  . Sister Alive     healthy  . Daughter Alive     2, twins  . Son Alive     ROS: A complete 10 system review of systems was obtained and was unremarkable apart from what is mentioned above.  PHYSICAL EXAMINATION:    VITALS:   Filed Vitals:   05/28/14 1006  BP: 108/60  Pulse: 72  Resp: 16  Height: 6\' 2"  (1.88 m)  Weight: 166 lb  (75.297  kg)   Wt Readings from Last 3 Encounters:  05/28/14 166 lb (75.297 kg)  01/27/14 173 lb 3 oz (78.557 kg)  12/04/13 174 lb 12.8 oz (79.289 kg)    GEN:  The patient appears stated age and is in NAD. HEENT:  Normocephalic, atraumatic.  The mucous membranes are moist. The superficial temporal arteries are without ropiness or tenderness. CV:  RRR Lungs:  CTAB Neck/HEME:  There are no carotid bruits bilaterally.  Neurological examination:  Orientation: The patient is alert and oriented x3. Fund of knowledge is appropriate.  Recent and remote memory are intact.  Attention and concentration are normal.    Able to name objects and repeat phrases. Cranial nerves: There is good facial symmetry.  There is facial hypomimia.  Pupils are equal round and reactive to light bilaterally. The speech is fluent and clear. Soft palate rises symmetrically and there is no tongue deviation. Hearing is intact to conversational tone. Sensation: Sensation is intact to light touch throughout. Motor: Strength is 5/5 in the bilateral upper and lower extremities.   Shoulder shrug is equal and symmetric.  There is no pronator drift.   Movement examination: Tone: There is moderate rigidity bilaterally.    Tone in the lower extremities was mildly increased, again an improvement.   Abnormal movements: There is an intermittent resting tremor bilaterally in the UE/LE Coordination:  There is  decremation with RAM's, seen primarily with toe taps/heel taps today bilaterally.  The lower extremities were good. Gait and Station: The patient has no difficulty arising out of a deep-seated chair without the use of the hands. The patient's stride length is fairly good There is decreased arm swing bilaterally.  The patient has a negative pull test.      ASSESSMENT/PLAN:  1. idiopathic Parkinson's disease.  The patient has tremor, bradykinesia, rigidity and mild postural instability.  -We discussed the diagnosis as well as  pathophysiology of the disease.  We discussed treatment options as well as prognostic indicators.  Patient education was provided.  -Remain on [name]  4 mg twice a day.    -He is on strange combination of levodopa, but he did not do well when he was on all IR formulation, which is really preferable.  He will remain on [name], one tablet 3 times a day and [name], half tablet twice a day.  We discussed [name] today, but I am not sure that [employer] pays for this and I am not sure it is time to change it yet.  I will check on this.  -I. will add 50/200 carbidopa/levodopa CR at nighttime, in hopes that the morning is better.  -DBS was again discussed in detail. greater than 50% of the 40 minute visit in counseling regarding DBS, risks and benefits and providing patient education.  He would like to talk to the patient who has had this and I will try to set this up.  -He will be referred for PT/OT.  He also asks for a referral to a nutritionist, which we will provide.  -Followup in the next few months, sooner should new neurologic issues arise.

## 2014-06-04 ENCOUNTER — Telehealth: Payer: Self-pay | Admitting: Neurology

## 2014-06-04 NOTE — Telephone Encounter (Signed)
Left message on machine for patient to call back. To make him aware of the contact information for our DBS patient advocate Elizabeth Sauer- Tommie Bowman ph # [phone].

## 2014-06-04 NOTE — Telephone Encounter (Signed)
Patient made aware of the contact information and will let me know if he needs anything.

## 2014-06-20 ENCOUNTER — Encounter: Attending: Neurology | Admitting: Dietician

## 2014-06-20 ENCOUNTER — Encounter: Payer: Self-pay | Admitting: Dietician

## 2014-06-20 VITALS — Ht 74.0 in | Wt 167.7 lb

## 2014-06-20 DIAGNOSIS — G20 Parkinson's disease: Secondary | ICD-10-CM | POA: Diagnosis not present

## 2014-06-20 DIAGNOSIS — R634 Abnormal weight loss: Secondary | ICD-10-CM | POA: Insufficient documentation

## 2014-06-20 DIAGNOSIS — Z713 Dietary counseling and surveillance: Secondary | ICD-10-CM | POA: Diagnosis not present

## 2014-06-20 DIAGNOSIS — G20A1 Parkinson's disease without dyskinesia, without mention of fluctuations: Secondary | ICD-10-CM | POA: Insufficient documentation

## 2014-06-20 NOTE — Progress Notes (Signed)
  Medical Nutrition Therapy:  Appt start time: 0945 end time:  1045.  Assessment:  Primary concerns today: Ian [name] was diagnosed with Parkinson's Disease last fall but suspects that he had it for longer. Lives with your wife and mother-in-law. He has 3 children who are in college and home for the summer. He is on disability.  States that he has lost a lot of weight (25 lbs) since the onset of the disease (3 years) and has trouble gaining weight and timing of medication with meals. Usual body weight was 190 lbs. Trying to avoid gluten, though not 100% of the time.  States he is a lot more active (spin class, swimming, walking, lifting weights) than before. Appetite is "average" and less than it was before. Trying to eat healthy and likely eating less calories.   Was drinking Ensure a few times per day though not recently. Takes medication at 8:30 AM with 20 oz water, tries to wait at least 30 minutes to eat.  Would like to weight 180-185 lbs.   Preferred Learning Style:   No preference indicated   Learning Readiness:   Ready  MEDICATIONS: L-Dopa, Sinemet   DIETARY INTAKE:  Usual eating pattern includes 2 meals and 2-3 snacks per day.  Avoided foods include gluten    24-hr recall:  B ( AM): eggs, turkey/pork sausage, bacon, grits GF toast with 20 oz water Snk ( AM): none L ( PM): crackers with pimento cheese, chicken salad, tuna salad, naked sub or burger Snk ( PM): fruit or peanuts or nuts, Ensure D ( PM): meat (chicken, salmon, or beef), 2 vegetables or a starch Snk ( PM):popcorn or ice cream, milk shake Beverages: water, Gatorade, lemonade, 1 glass per day lactose free 2%   Usual physical activity: 60-90 hour exercise every day  Estimated energy needs: 2800-3000 calories 315-338 g carbohydrates 210-225 g protein 78-83 g fat  Progress Towards Goal(s):  In progress.   Nutritional Diagnosis:  [place]-3.2 Unintentional weight loss As related to Parkinson's Disease.  As  evidenced by 25 lbs weight loss and patient report of inability to gain weight.    Intervention:  Nutrition counseling provided.  Plan to eat 6 meals/snacks per day (450-500 calories per meal). Goal is 2800-3000 calories per day.  7:30 - Take medication 8:00 - Protein shake with whole milk (fruits, veggies, and protein powder) 10:00 -10:30- cereal, oatmeal, grits, toast, eggs, [place] bacon with whole milk and add butter or Ensure (or [employer] Instant Breakfast) or have full fat yogurt Workout 12:30-1:00 - Sandwich on gluten free bread or tortillas add cheese, butter, mayo, eggs 3:00-3:30 - Protein bar, nuts, nut butter, Ensure 6:30-8:00 - 6-8 oz meat, vegetables, starch (add butter, cheese, olive oil) 9:00-10:00 - Milkshake, ice cream  Teaching Method Utilized:  Visual Auditory Hands on  Barriers to learning/adherence to lifestyle change: decrease appetite, higher calorie needs  Demonstrated degree of understanding via:  Teach Back   Monitoring/Evaluation:  Dietary intake, exercise, and body weight in 4 week(s).

## 2014-06-20 NOTE — Patient Instructions (Signed)
Plan to eat 6 meals/snacks per day (450-500 calories per meal). Goal is 2800-3000 calories per day.  7:30 - Take medication 8:00 - Protein shake with whole milk (fruits, veggies, and protein powder) 10:00 -10:30- cereal, oatmeal, grits, toast, eggs, [place] bacon with whole milk and add butter or Ensure (or [employer] Instant Breakfast) or have full fat yogurt Workout 12:30-1:00 - Sandwich on gluten free bread or tortillas add cheese, butter, mayo, eggs 3:00-3:30 - Protein bar, nuts, nut butter, Ensure 6:30-8:00 - 6-8 oz meat, vegetables, starch (add butter, cheese, olive oil) 9:00-10:00 - Milkshake, ice cream

## 2014-07-24 ENCOUNTER — Ambulatory Visit: Admitting: Dietician

## 2014-07-28 ENCOUNTER — Ambulatory Visit: Admitting: Physical Therapy

## 2014-07-28 ENCOUNTER — Other Ambulatory Visit: Payer: Self-pay | Admitting: Neurology

## 2014-07-28 ENCOUNTER — Ambulatory Visit: Admitting: Occupational Therapy

## 2014-07-28 NOTE — Telephone Encounter (Signed)
Requip 4 mg refill requested. Per last office note- patient to remain on medication. Refill approved and sent to patient's pharmacy.

## 2014-08-01 ENCOUNTER — Ambulatory Visit: Attending: Neurology | Admitting: Occupational Therapy

## 2014-08-01 DIAGNOSIS — IMO0001 Reserved for inherently not codable concepts without codable children: Secondary | ICD-10-CM | POA: Diagnosis present

## 2014-08-01 DIAGNOSIS — R269 Unspecified abnormalities of gait and mobility: Secondary | ICD-10-CM | POA: Diagnosis not present

## 2014-08-04 ENCOUNTER — Ambulatory Visit: Admitting: Physical Therapy

## 2014-08-11 ENCOUNTER — Ambulatory Visit: Admitting: Physical Therapy

## 2014-08-11 DIAGNOSIS — IMO0001 Reserved for inherently not codable concepts without codable children: Secondary | ICD-10-CM | POA: Diagnosis not present

## 2014-08-12 ENCOUNTER — Ambulatory Visit: Admitting: Occupational Therapy

## 2014-08-12 ENCOUNTER — Ambulatory Visit: Admitting: Physical Therapy

## 2014-08-12 DIAGNOSIS — IMO0001 Reserved for inherently not codable concepts without codable children: Secondary | ICD-10-CM | POA: Diagnosis not present

## 2014-08-20 ENCOUNTER — Ambulatory Visit: Admitting: Occupational Therapy

## 2014-08-20 ENCOUNTER — Ambulatory Visit: Attending: Neurology | Admitting: Physical Therapy

## 2014-08-20 DIAGNOSIS — R269 Unspecified abnormalities of gait and mobility: Secondary | ICD-10-CM | POA: Diagnosis not present

## 2014-08-20 DIAGNOSIS — IMO0001 Reserved for inherently not codable concepts without codable children: Secondary | ICD-10-CM | POA: Diagnosis present

## 2014-08-22 ENCOUNTER — Ambulatory Visit: Admitting: Physical Therapy

## 2014-08-22 DIAGNOSIS — IMO0001 Reserved for inherently not codable concepts without codable children: Secondary | ICD-10-CM | POA: Diagnosis not present

## 2014-08-26 ENCOUNTER — Ambulatory Visit: Admitting: Physical Therapy

## 2014-08-26 ENCOUNTER — Ambulatory Visit: Admitting: Occupational Therapy

## 2014-08-26 DIAGNOSIS — IMO0001 Reserved for inherently not codable concepts without codable children: Secondary | ICD-10-CM | POA: Diagnosis not present

## 2014-08-27 ENCOUNTER — Ambulatory Visit (INDEPENDENT_AMBULATORY_CARE_PROVIDER_SITE_OTHER): Admitting: Neurology

## 2014-08-27 ENCOUNTER — Encounter: Payer: Self-pay | Admitting: Neurology

## 2014-08-27 VITALS — BP 120/60 | HR 80 | Resp 14 | Ht 74.0 in | Wt 165.0 lb

## 2014-08-27 DIAGNOSIS — G20A1 Parkinson's disease without dyskinesia, without mention of fluctuations: Secondary | ICD-10-CM

## 2014-08-27 DIAGNOSIS — G20 Parkinson's disease: Secondary | ICD-10-CM

## 2014-08-27 DIAGNOSIS — G4752 REM sleep behavior disorder: Secondary | ICD-10-CM | POA: Insufficient documentation

## 2014-08-27 MED ORDER — CARBIDOPA-LEVODOPA ER 36.25-145 MG PO CPCR: 2.0000 | ORAL_CAPSULE | Freq: Three times a day (TID) | ORAL | Status: DC

## 2014-08-27 MED ORDER — CLONAZEPAM 0.5 MG PO TABS: 0.5000 mg | ORAL_TABLET | Freq: Every day | ORAL | Status: DC

## 2014-08-27 MED ORDER — LORAZEPAM 0.5 MG PO TABS: 0.5000 mg | ORAL_TABLET | ORAL | Status: DC | PRN

## 2014-08-27 NOTE — Progress Notes (Signed)
Ian [name] was seen today in the movement disorders clinic for neurologic consultation at the request of his PCP, Dr. Drue Second.  He has previously seen  [facility], [place] and saw [name] in [place].   This patient is accompanied in the office by his spouse who supplements the history.The consultation is for the evaluation of PD.  I did review Dr. [name] notes.  Dr. Hurley Cisco notes are not available to me.    Hist first sx was tremor was R arm tremor.  It was in 2011 according to the patient today, but Dr. [name] notes indicate that perhaps this was in 2009.  He was dx with enhanced physiologic tremor.  He was started on propranolol with some help, but made him sluggish.  He went to see [name] in 2013 for an opinion in [place].  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  He was placed on baclofen and requip.  He thinks that the requip has helped; he is able to turn over in the bed better and move better.  The pt first saw Dr. [name] on 09/10/13.  Dr. [name] agreed with the Requip and start the patient on [name], but the patient is currently only on its once per day.  12/04/13 update:  The pt presents to the clinic today for his PD.  He is exercising faithfully.  He attends the PD exercise class through the rehab center. The pt was changed to the IR formulation of carbidopa/levodopa 25/100 last visit and is on it tid.  He is not sure it helps.  Afternoons are better than mornings.  carbidopa/levodopa 25/100 is taken at 8am/3:30 pm and bedtime. He is also on requip xl 6 mg daily.  Continues to have significant tremor.  01/27/14 update:  The patient presents today with his wife, who supplements the history.  The pt has a hx of PD and last visit, we decided to increase his carbidopa/levodopa to 2 po tid.  the patient states that he just felt lightheaded on this medication  dosage and up going back to [name] 3 times per day and then takes [name], half tablet twice per day. He remains on requip xl 6 mg daily.  Overall, he does feel fairly good.  He still has tremor.  He went to see Dr. [name] since our last visit and again talked about DBS therapy.  Dr. [name] mentioned to him that he looks much better and he had previously and had [name] score had declined.  Some yelling out in the sleep but this has not been a huge issue.  No sleepwalking/talking.  No falls.  He is exercising faithfully.  05/28/14 update:  The patient is presenting today for followup.  Last visit, I increased his [name] from 6 mg daily to 4 mg twice a day.  He is doing well with this.  Tremor is about the same.  He is on a strange combination of carbidopa levodopa, but when I tried to switch him off the IR version, the patient just felt lightheaded and ended up going back to a combination of IR and CR.  He is currently on 25/100 CR 3 times per day and then takes [name], half tablet twice per day and sometimes three times per day.  He does best in the evening and worst in the AM.   No swallowing troubles.  Has dropped  weight but thinks that it is exercise and was trying to follow gluten free diet. He is walking, doing a spinning class for PD, and has started golfing again, which he hasn't done in years.   Still considering DBS therapy.    08/27/14 update:  Patient presents today for followup.  He is currently on [name],  twice a day.  He is on a strange combination of carbidopa levodopa, but hasn't been able to tolerate the IR version well.[name]  He is currently on 25/100 CR 2-3 times per day and then 50/200 at night.  He rarely takes the IR now; only if playing golf or if he needs a boost during the day.  He is finding that he is having more tremor.  He is also having dreams at night that are making his sleep feel unrestful.  He is strongly  considering deep brain stimulation.  He asks me about details regarding this today.  He has asks me about potentially getting a refill on his Ativan.  He apparently got this filled from an outside physician about a year and a half ago and only had 10 pills in total and is just now running out.  He takes one pill every 3 or 4 months if he has to go to a function with his wife.  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  He asks me if he can have a refill today.   Neuroimaging has previously been performed.  It is not available for my review today.  I did ask the patient to get me.  PREVIOUS MEDICATIONS: Requip and neupro (tried few days only); propranolol helped tremor some  ALLERGIES:  No Known Allergies  CURRENT MEDICATIONS:     Medication List       This list is accurate as of: 08/27/14 10:17 AM.  Always use your most recent med list.               Co Q-10 100 MG Caps  Take 100 mg by mouth daily.     D-5000 5000 UNITS Tabs  Generic drug:  Cholecalciferol  Take 1 tablet by mouth daily.     GLUTATHIONE PO  Take by mouth.     LECITHIN PO  Take by mouth.     MAGNESIUM PO  Take by mouth daily.     MSM PO  Take by mouth.     MULTI-VITAMINS Tabs  Take 1 tablet by mouth daily.     niacin 500 MG tablet  Take 5,000 mg by mouth at bedtime.     rOPINIRole 4 MG 24 hr tablet  Commonly known as:  [name]  TAKE ONE TABLET BY MOUTH TWICE DAILY     SINEMET CR 25-100 MG tablet controlled release  Generic drug:  Carbidopa-Levodopa ER  Take 1 tablet by mouth 3 (three) times daily.     carbidopa-levodopa 25-100 MG per tablet  Commonly known as:  SINEMET IR  Take 0.5 tablets by mouth daily. At noon     carbidopa-levodopa 50-200 MG per tablet  Commonly known as:  SINEMET CR  Take 1 tablet by mouth at bedtime.     VITAMIN C PO  Take by mouth daily.     vitamin E 400 UNIT capsule  Take 400 Units by mouth 3 (three) times  daily.         PAST MEDICAL HISTORY:   Past Medical History  Diagnosis Date  .  PD (Parkinson's disease) 09/10/2013    PAST SURGICAL HISTORY:   Past Surgical History  Procedure Laterality Date  . None      SOCIAL HISTORY:   History   Social History  . Marital Status: Married    Spouse Name: N/A    Number of Children: N/A  . Years of Education: N/A   Occupational History  . Not on file.   Social History Main Topics  . Smoking status: Never Smoker   . Smokeless tobacco: Never Used  . Alcohol Use: Yes     Comment: Occ  . Drug Use: Not on file  . Sexual Activity: Not on file   Other Topics Concern  . Not on file   Social History Narrative  . No narrative on file    FAMILY HISTORY:   Family Status  Relation Status Death Age  . Mother Deceased 51    Breast Cancer  . Father Alive     Diabetes  . Sister Alive     healthy  . Daughter Alive     2, twins  . Son Alive     ROS: A complete 10 system review of systems was obtained and was unremarkable apart from what is mentioned above.  PHYSICAL EXAMINATION:    VITALS:   Filed Vitals:   08/27/14 1008  BP: 120/60  Pulse: 80  Resp: 14  Height:  (1.88 m)  Weight: 165 lb (74.844 kg)   Wt Readings from Last 3 Encounters:  08/27/14 165 lb (74.844 kg)  06/20/14 167 lb 11.2 oz (76.068 kg)  05/28/14 166 lb (75.297 kg)    GEN:  The patient appears stated age and is in NAD. HEENT:  Normocephalic, atraumatic.  The mucous membranes are moist. The superficial temporal arteries are without ropiness or tenderness. CV:  RRR Lungs:  CTAB Neck/HEME:  There are no carotid bruits bilaterally.  Neurological examination:  Orientation: The patient is alert and oriented x3. Fund of knowledge is appropriate.  Recent and remote memory are intact.  Attention and concentration are normal.    Able to name objects and repeat phrases. Cranial nerves: There is good facial symmetry.  There is facial hypomimia.  Pupils are  equal round and reactive to light bilaterally. The speech is fluent and clear. Soft palate rises symmetrically and there is no tongue deviation. Hearing is intact to conversational tone. Sensation: Sensation is intact to light touch throughout. Motor: Strength is 5/5 in the bilateral upper and lower extremities.   Shoulder shrug is equal and symmetric.  There is no pronator drift.   Movement examination: Tone: There is moderate rigidity bilaterally.  The right is more severe than the left.   Tone in the lower extremities was mildly increased, again an improvement.   Abnormal movements: There is an intermittent resting tremor bilaterally in the UE/LE Coordination:  There is  decremation with RAM's, seen primarily with toe taps/heel taps today bilaterally.  The lower extremities were good. Gait and Station: The patient has no difficulty arising out of a deep-seated chair without the use of the hands. The patient's stride length is fairly good There is decreased arm swing bilaterally.  The patient has a negative pull test.      ASSESSMENT/PLAN:  1. idiopathic Parkinson's disease.  The patient has tremor, bradykinesia, rigidity and mild postural instability.  -We discussed the diagnosis as well as pathophysiology of the disease.  We discussed treatment options as well as prognostic indicators.  Patient education was provided.  -  Remain on [name]  4 mg twice a day.    -We decided to change him to [name] today.  I gave him samples of [name] 145 mg and he will take 2 tablets 3 times per day.  He is to call me and let me know how he did with this.  -DBS was again discussed in detail. greater than 50% of the 40 minute visit in counseling regarding DBS, risks and benefits and providing patient education.  We discussed various programs in the state and there similarities and differences. He would like to talk to the patient who has had this and I will try to set this up.  He also would like to meet with Dr.  Venetia Maxon.  I will plan to set up the consult for that. 2.  REM behavior disorder.  -We will initiate clonazepam, 0.5 mg, half to one tablet at night. 3.  anxiety, with levodopa resistant tremor.  -As above, he asked me for a prescription refill on his Ativan.  He has only used 10 pills in the last year and a half.  I told him that I really have no objection to this as he rarely uses it, so long as he does not mix it with the clonazepam.  Risks, benefits, side effects and alternative therapies were discussed.  The opportunity to ask questions was given and they were answered to the best of my ability.  The patient expressed understanding and willingness to follow the outlined treatment protocols. 4.  Return in about 3 months (around 11/26/2014).

## 2014-08-27 NOTE — Patient Instructions (Addendum)
1. Start [name] - 2 tablets 3 times daily.  2. Dr Fredrich Birks office will call you with an appt.  3. Clonazepam and Ativan prescriptions sent to your pharmacy.

## 2014-08-28 ENCOUNTER — Ambulatory Visit: Admitting: Occupational Therapy

## 2014-08-28 DIAGNOSIS — IMO0001 Reserved for inherently not codable concepts without codable children: Secondary | ICD-10-CM | POA: Diagnosis not present

## 2014-08-29 ENCOUNTER — Ambulatory Visit: Admitting: Physical Therapy

## 2014-09-01 ENCOUNTER — Ambulatory Visit: Admitting: Neurology

## 2014-09-02 ENCOUNTER — Ambulatory Visit: Admitting: Occupational Therapy

## 2014-09-02 ENCOUNTER — Ambulatory Visit: Admitting: Physical Therapy

## 2014-09-02 DIAGNOSIS — IMO0001 Reserved for inherently not codable concepts without codable children: Secondary | ICD-10-CM | POA: Diagnosis not present

## 2014-09-04 ENCOUNTER — Telehealth: Payer: Self-pay | Admitting: Neurology

## 2014-09-04 ENCOUNTER — Ambulatory Visit: Admitting: Dietician

## 2014-09-04 ENCOUNTER — Encounter: Payer: Self-pay | Admitting: Neurology

## 2014-09-04 MED ORDER — CARBIDOPA-LEVODOPA ER 36.25-145 MG PO CPCR: 2.0000 | ORAL_CAPSULE | Freq: Three times a day (TID) | ORAL | Status: DC

## 2014-09-04 NOTE — Telephone Encounter (Signed)
Left message on machine for patient to call back. To see how he is doing on [name]. Awaiting call back.

## 2014-09-04 NOTE — Telephone Encounter (Signed)
Spoke with Ian [name] and he is doing great on [name]. Prescription sent to pharmacy. I will let him know if prior approval is required. More samples at front desk for Ian [name] pick up in case prior authorization is required. Ian [name] states Dr Fredrich Birks office called him to set up appt to discuss DBS but he has not called back to schedule. He will call with any problems.

## 2014-09-05 ENCOUNTER — Ambulatory Visit: Admitting: Physical Therapy

## 2014-09-05 ENCOUNTER — Ambulatory Visit: Admitting: Occupational Therapy

## 2014-09-05 DIAGNOSIS — IMO0001 Reserved for inherently not codable concepts without codable children: Secondary | ICD-10-CM | POA: Diagnosis not present

## 2014-09-08 ENCOUNTER — Ambulatory Visit

## 2014-09-08 DIAGNOSIS — IMO0001 Reserved for inherently not codable concepts without codable children: Secondary | ICD-10-CM | POA: Diagnosis not present

## 2014-09-09 ENCOUNTER — Telehealth: Payer: Self-pay | Admitting: Neurology

## 2014-09-09 NOTE — Telephone Encounter (Signed)
Does it bother him?  If so, I would want to re-evaluate quickly in office before starting anything else.

## 2014-09-09 NOTE — Telephone Encounter (Signed)
Tried to call to make patient aware to schedule a follow up appt for evaluation. No answer. Will try again later.

## 2014-09-09 NOTE — Telephone Encounter (Signed)
Pt called requesting to speak to a nurse regarding his meds. Please call pt # [phone]

## 2014-09-09 NOTE — Telephone Encounter (Signed)
Spoke with patient. She does state that yesterday and today and he has had some involuntary movements in his stomach and shoulders. He called them like a contracting feeling - didn't know if this is dyskinesia and his dose may be too high? He is taking doses in the morning, around noon and around dinner. He is moving and feeling better otherwise. Please advise.

## 2014-09-10 ENCOUNTER — Encounter: Payer: Self-pay | Admitting: Neurology

## 2014-09-10 ENCOUNTER — Ambulatory Visit (INDEPENDENT_AMBULATORY_CARE_PROVIDER_SITE_OTHER): Admitting: Neurology

## 2014-09-10 VITALS — BP 110/64 | HR 89 | Ht 74.0 in | Wt 167.0 lb

## 2014-09-10 DIAGNOSIS — R279 Unspecified lack of coordination: Secondary | ICD-10-CM

## 2014-09-10 DIAGNOSIS — G20 Parkinson's disease: Secondary | ICD-10-CM

## 2014-09-10 DIAGNOSIS — G20A1 Parkinson's disease without dyskinesia, without mention of fluctuations: Secondary | ICD-10-CM

## 2014-09-10 DIAGNOSIS — G249 Dystonia, unspecified: Secondary | ICD-10-CM

## 2014-09-10 NOTE — Progress Notes (Signed)
Ian [name] was seen today in the movement disorders clinic for neurologic consultation at the request of his PCP, Ian [name].  [facility] has previously seen  [facility] and saw Dr. Edwin [name] in [place].   This patient is accompanied in the office by his spouse who supplements the history.The consultation is for the evaluation of PD.  I did review Dr. [name] notes.  Dr. Hurley Cisco notes are not available to me.    Hist first sx was tremor was R arm tremor.  It was in 2011 according to the patient today, but Dr. Faye [name] notes indicate that perhaps this was in 2009.  He was dx with enhanced physiologic tremor.  He was started on propranolol with some help, but made him sluggish.  He went to see Dr. Edwin [name] in 2013 for an opinion in [place].  Dr. 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  He was placed on baclofen and requip.  He thinks that the requip has helped; he is able to turn over in the bed better and move better.  The pt first saw Dr. Rubin [name] on 09/10/13.  Dr. Rubin [name] agreed with the Requip and start the patient on [name], but the patient is currently only on its once per day.  12/04/13 update:  The pt presents to the clinic today for his PD.  He is exercising faithfully.  He attends the PD exercise class through the rehab center. The pt was changed to the IR formulation of carbidopa/levodopa 25/100 last visit and is on it tid.  He is not sure it helps.  Afternoons are better than mornings.  carbidopa/levodopa 25/100 is taken at 8am/3:30 pm and bedtime. He is also on requip xl 6 mg daily.  Continues to have significant tremor.  01/27/14 update:  The patient presents today with his wife, who supplements the history.  The pt has a hx of PD and last visit, we decided to increase his carbidopa/levodopa to 2 po tid.  the patient states that he just felt lightheaded on this medication  dosage and up going back to [name] 3 times per day and then takes [name], half tablet twice per day. He remains on requip xl 6 mg daily.  Overall, he does feel fairly good.  He still has tremor.  He went to see Dr. Rubin [name] since our last visit and again talked about DBS therapy.  Dr. Rubin [name] mentioned to him that he looks much better and he had previously and had [name] score had declined.  Some yelling out in the sleep but this has not been a huge issue.  No sleepwalking/talking.  No falls.  He is exercising faithfully.  05/28/14 update:  The patient is presenting today for followup.  Last visit, I increased his [name] from 6 mg daily to 4 mg twice a day.  He is doing well with this.  Tremor is about the same.  He is on a strange combination of carbidopa levodopa, but when I tried to switch him off the IR version, the patient just felt lightheaded and ended up going back to a combination of IR and CR.  He is currently on 25/100 CR 3 times per day and then takes [name], half tablet twice per day and sometimes three times per day.  He does best in the evening and worst in the AM.   No swallowing troubles.  Has dropped  weight but thinks that it is exercise and was trying to follow gluten free diet. He is walking, doing a spinning class for PD, and has started golfing again, which he hasn't done in years.   Still considering DBS therapy.    08/27/14 update:  Patient presents today for followup.  He is currently on [name],  twice a day.  He is on a strange combination of carbidopa levodopa, but hasn't been able to tolerate the IR version well.[name]  He is currently on 25/100 CR 2-3 times per day and then 50/200 at night.  He rarely takes the IR now; only if playing golf or if he needs a boost during the day.  He is finding that he is having more tremor.  He is also having dreams at night that are making his sleep feel unrestful.  He is strongly  considering deep brain stimulation.  He asks me about details regarding this today.  He has asks me about potentially getting a refill on his Ativan.  He apparently got this filled from an outside physician about a year and a half ago and only had 10 pills in total and is just now running out.  He takes one pill every 3 or 4 months if he has to go to a function with his wife.  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  He asks me if he can have a refill today.  09/10/14 update:  The patient is seen today unexpectedly.  Last visit, I started [name] 145 mg, 2 tablets 3 times per day.  He is also on [name] 4 mg twice a day.  He called me initially to tell me that the [name] was working great and he felt much better on the medication.  In fact, he states that his PT and OT reported that he looked much better and his eval scores were markedly improved and they asked him what he had done differently.  He states last week was just a great week.  On Saturday, he was just a little lightheaded and his wife checked his orthostatics and he states that they were negative.  This week, however, he began to report some abnormal movements.  The movements were primarily of the left shoulder and in the abdomen. I wanted to see him on this medication before added or changed anything, as I have never seen him with dyskinesia.  Unfortunately, this morning he only took one of his tablets, but he did take 2 tablets at lunch time about 1 hour before coming.  He has not noticed the movements today.  He has noticed much less tremor overall since starting [name].  Neuroimaging has previously been performed.  It is not available for my review today.  I did ask the patient to get me.  PREVIOUS MEDICATIONS: Requip and neupro (tried few days only); propranolol helped tremor some  ALLERGIES:  No Known Allergies  CURRENT MEDICATIONS:     Medication List       This list is  accurate as of: 09/10/14  1:54 PM.  Always use your most recent med list.               Carbidopa-Levodopa ER 36.25-145 MG Cpcr  Commonly known as:  [name]  Take 2 tablets by mouth 3 (three) times daily.     clonazePAM 0.5 MG tablet  Commonly known as:  KLONOPIN  Take 1 tablet (0.5 mg total) by mouth  at bedtime.     Co Q-10 100 MG Caps  Take 100 mg by mouth daily.     D-5000 5000 UNITS Tabs  Generic drug:  Cholecalciferol  Take 1 tablet by mouth daily.     GLUTATHIONE PO  Take by mouth.     LECITHIN PO  Take by mouth.     LORazepam 0.5 MG tablet  Commonly known as:  ATIVAN  Take 1 tablet (0.5 mg total) by mouth as needed for anxiety.     MAGNESIUM PO  Take by mouth daily.     MSM PO  Take by mouth.     MULTI-VITAMINS Tabs  Take 1 tablet by mouth daily.     niacin 500 MG tablet  Take 5,000 mg by mouth at bedtime.     rOPINIRole 4 MG 24 hr tablet  Commonly known as:  [name]  TAKE ONE TABLET BY MOUTH TWICE DAILY     VITAMIN C PO  Take by mouth daily.     vitamin E 400 UNIT capsule  Take 400 Units by mouth 3 (three) times daily.         PAST MEDICAL HISTORY:   Past Medical History  Diagnosis Date  . PD (Parkinson's disease) 09/10/2013    PAST SURGICAL HISTORY:   Past Surgical History  Procedure Laterality Date  . None      SOCIAL HISTORY:   History   Social History  . Marital Status: Married    Spouse Name: N/A    Number of Children: N/A  . Years of Education: N/A   Occupational History  . Not on file.   Social History Main Topics  . Smoking status: Never Smoker   . Smokeless tobacco: Never Used  . Alcohol Use: Yes     Comment: Occ  . Drug Use: Not on file  . Sexual Activity: Not on file   Other Topics Concern  . Not on file   Social History Narrative  . No narrative on file    FAMILY HISTORY:   Family Status  Relation Status Death Age  . Mother Deceased 12    Breast Cancer  . Father Alive     Diabetes  . Sister  Alive     healthy  . Daughter Alive     2, twins  . Son Alive     ROS: A complete 10 system review of systems was obtained and was unremarkable apart from what is mentioned above.  PHYSICAL EXAMINATION:    VITALS:   Filed Vitals:   09/10/14 1306  BP: 110/64  Pulse: 89  Height:  (1.88 m)  Weight: 167 lb (75.751 kg)  SpO2: 98%   Wt Readings from Last 3 Encounters:  09/10/14 167 lb (75.751 kg)  08/27/14 165 lb (74.844 kg)  06/20/14 167 lb 11.2 oz (76.068 kg)    GEN:  The patient appears stated age and is in NAD. HEENT:  Normocephalic, atraumatic.  The mucous membranes are moist. The superficial temporal arteries are without ropiness or tenderness. CV:  RRR Lungs:  CTAB Neck/HEME:  There are no carotid bruits bilaterally.  Neurological examination:  Orientation: The patient is alert and oriented x3. Fund of knowledge is appropriate.  Recent and remote memory are intact.  Attention and concentration are normal.    Able to name objects and repeat phrases. Cranial nerves: There is good facial symmetry.  There is facial hypomimia.  Pupils are equal round and reactive to light bilaterally. The  speech is fluent and clear. Soft palate rises symmetrically and there is no tongue deviation. Hearing is intact to conversational tone. Sensation: Sensation is intact to light touch throughout. Motor: Strength is 5/5 in the bilateral upper and lower extremities.   Shoulder shrug is equal and symmetric.  There is no pronator drift.   Movement examination: Tone: There is moderate to severe rigidity in the right upper extremity, but the left upper extremity only has mild to moderate rigidity, which is much improved. Abnormal movements: There is an intermittent resting tremor bilaterally in the lower extremities, but the upper extremity resting tremor is markedly improved.  I saw no dyskinesia. Coordination:  There is  decremation with RAM's, seen primarily with toe taps/heel taps today  bilaterally.   Gait and Station: The patient has no difficulty arising out of a deep-seated chair without the use of the hands. The patient's stride length is fairly good There is decreased arm swing bilaterally.  The patient has a negative pull test.      ASSESSMENT/PLAN:  1. idiopathic Parkinson's disease.  The patient has tremor, bradykinesia, rigidity and mild postural instability.  -We discussed the diagnosis as well as pathophysiology of the disease.  We discussed treatment options as well as prognostic indicators.  Patient education was provided.  -Remain on [name]  4 mg twice a day.    -The patient looks better on [name] 145 mg, 2 tablets 3 times per day, but I would argue that he is still underdosed.  He still has significant rigidity on the right.  While he may have been experiencing some minor dyskinesia, I certainly did not see any today.  I talked to him about options, including amantadine, but ultimately we decided to wait for a week and see how he does.  -DBS was again discussed in detail. greater than 50% of the 40 minute visit in counseling regarding DBS, risks and benefits and providing patient education.  Dr. Fredrich Birks office did call him for an appointment, and he stated that he needed to call them back.  I encouraged him to do this. 2.  REM behavior disorder.  -He has clonazepam as needed. 3.  follow up at his previously scheduled appointment.

## 2014-09-10 NOTE — Telephone Encounter (Signed)
Appt made with patient for this afternoon for evaluation.

## 2014-09-12 ENCOUNTER — Ambulatory Visit: Admitting: Occupational Therapy

## 2014-09-12 DIAGNOSIS — IMO0001 Reserved for inherently not codable concepts without codable children: Secondary | ICD-10-CM | POA: Diagnosis not present

## 2014-09-15 ENCOUNTER — Ambulatory Visit: Admitting: Occupational Therapy

## 2014-09-15 ENCOUNTER — Ambulatory Visit: Admitting: Physical Therapy

## 2014-09-15 DIAGNOSIS — IMO0001 Reserved for inherently not codable concepts without codable children: Secondary | ICD-10-CM | POA: Diagnosis not present

## 2014-09-19 ENCOUNTER — Ambulatory Visit: Admitting: Physical Therapy

## 2014-09-19 ENCOUNTER — Ambulatory Visit: Attending: Neurology | Admitting: Occupational Therapy

## 2014-09-19 DIAGNOSIS — M629 Disorder of muscle, unspecified: Secondary | ICD-10-CM | POA: Diagnosis not present

## 2014-09-19 DIAGNOSIS — R279 Unspecified lack of coordination: Secondary | ICD-10-CM | POA: Diagnosis not present

## 2014-09-19 DIAGNOSIS — R269 Unspecified abnormalities of gait and mobility: Secondary | ICD-10-CM | POA: Diagnosis not present

## 2014-09-19 DIAGNOSIS — G20 Parkinson's disease: Secondary | ICD-10-CM | POA: Diagnosis present

## 2014-09-22 ENCOUNTER — Ambulatory Visit: Admitting: Occupational Therapy

## 2014-09-22 ENCOUNTER — Ambulatory Visit: Admitting: Physical Therapy

## 2014-09-22 DIAGNOSIS — G20 Parkinson's disease: Secondary | ICD-10-CM | POA: Diagnosis not present

## 2014-09-26 ENCOUNTER — Ambulatory Visit: Admitting: Physical Therapy

## 2014-09-26 ENCOUNTER — Ambulatory Visit: Admitting: Occupational Therapy

## 2014-09-26 DIAGNOSIS — G20 Parkinson's disease: Secondary | ICD-10-CM | POA: Diagnosis not present

## 2014-10-02 ENCOUNTER — Telehealth: Payer: Self-pay | Admitting: Neurology

## 2014-10-02 NOTE — Telephone Encounter (Signed)
Received request for records via mail from [employer]. Request forwarded via interoffice mail to HIM at [facility] for processing / Sherri S.

## 2014-10-02 NOTE — Telephone Encounter (Signed)
Request received for medical records and forwarded to Medical records department.

## 2014-10-17 ENCOUNTER — Telehealth: Payer: Self-pay | Admitting: Neurology

## 2014-10-17 NOTE — Telephone Encounter (Signed)
Received [facility] from [facility]. Given to Sherri to send to Medical Records.

## 2014-10-20 ENCOUNTER — Telehealth: Payer: Self-pay | Admitting: Neurology

## 2014-10-20 NOTE — Telephone Encounter (Signed)
recv'd fax from the [facility] requesting info on this patient. The last page of the faxed request got separated from the first 3 pages. The first pages were sent earlier this AM via courier to HIM [facility]  for processing. The 4th and final page, which is the signed consent is bring sent this afternoon via courier. / Sherri S. -

## 2014-10-24 ENCOUNTER — Telehealth: Payer: Self-pay | Admitting: Neurology

## 2014-10-24 NOTE — Telephone Encounter (Signed)
[phone] pt would liek to know about his disability forms please call him

## 2014-10-24 NOTE — Telephone Encounter (Signed)
Spoke with patient he states that [employer] has sent of two request for Medical records and has not received anything yet. Have you received this request. Have you faxed this yet?

## 2014-10-24 NOTE — Telephone Encounter (Signed)
Unable to reach patient however i did leave a voice message stating someone who return his call on Monday in reference to these forms

## 2014-10-24 NOTE — Telephone Encounter (Signed)
Pt called/returning your call at 1:08PM. C/B [phone]

## 2014-10-27 NOTE — Telephone Encounter (Signed)
Note from 10/30 in [facility] that we received request and this was given to Sherri to forward to medical records.

## 2014-10-28 ENCOUNTER — Telehealth: Payer: Self-pay | Admitting: Neurology

## 2014-10-28 NOTE — Telephone Encounter (Signed)
I would recommend he call our HIM department. They process those records and confirm both the receipt of the request and also provide the date the records were sent back out. HIM can be reached by calling # [phone]. / Roanna RaiderSherri

## 2014-10-28 NOTE — Telephone Encounter (Signed)
Patient made aware and given contact number to reach medical records.

## 2014-10-28 NOTE — Telephone Encounter (Signed)
Pt called regarding the records his disability had requested the records in October and early November. Please call pt # [phone]

## 2014-10-28 NOTE — Telephone Encounter (Signed)
Sherri- received request on 10/02/2014 and forwarded to medical records (note in chart). What is the next step for this patient?

## 2014-11-11 DIAGNOSIS — Z0279 Encounter for issue of other medical certificate: Secondary | ICD-10-CM

## 2014-11-24 ENCOUNTER — Telehealth: Payer: Self-pay | Admitting: Neurology

## 2014-11-24 NOTE — Telephone Encounter (Signed)
Pt resch from 11-26-14 to 11-25-14

## 2014-11-25 ENCOUNTER — Encounter: Payer: Self-pay | Admitting: Neurology

## 2014-11-25 ENCOUNTER — Ambulatory Visit (INDEPENDENT_AMBULATORY_CARE_PROVIDER_SITE_OTHER): Admitting: Neurology

## 2014-11-25 VITALS — BP 130/60 | HR 88 | Ht 74.0 in | Wt 168.0 lb

## 2014-11-25 DIAGNOSIS — G20 Parkinson's disease: Secondary | ICD-10-CM

## 2014-11-25 DIAGNOSIS — M7711 Lateral epicondylitis, right elbow: Secondary | ICD-10-CM

## 2014-11-25 DIAGNOSIS — Z5181 Encounter for therapeutic drug level monitoring: Secondary | ICD-10-CM

## 2014-11-25 LAB — COMPREHENSIVE METABOLIC PANEL
ALBUMIN: 4.1 g/dL (ref 3.5–5.2)
ALK PHOS: 68 U/L (ref 39–117)
ALT: 11 U/L (ref 0–53)
AST: 18 U/L (ref 0–37)
BILIRUBIN TOTAL: 0.4 mg/dL (ref 0.2–1.2)
BUN: 17 mg/dL (ref 6–23)
CO2: 29 mEq/L (ref 19–32)
Calcium: 9.2 mg/dL (ref 8.4–10.5)
Chloride: 104 mEq/L (ref 96–112)
Creat: 0.91 mg/dL (ref 0.50–1.35)
Glucose, Bld: 96 mg/dL (ref 70–99)
Potassium: 4.2 mEq/L (ref 3.5–5.3)
SODIUM: 139 meq/L (ref 135–145)
Total Protein: 6.1 g/dL (ref 6.0–8.3)

## 2014-11-25 LAB — CBC WITH DIFFERENTIAL/PLATELET
BASOS ABS: 0 10*3/uL (ref 0.0–0.1)
BASOS PCT: 0 % (ref 0–1)
Eosinophils Absolute: 0.2 10*3/uL (ref 0.0–0.7)
Eosinophils Relative: 3 % (ref 0–5)
HCT: 43.4 % (ref 39.0–52.0)
Hemoglobin: 14.9 g/dL (ref 13.0–17.0)
Lymphocytes Relative: 31 % (ref 12–46)
Lymphs Abs: 1.6 10*3/uL (ref 0.7–4.0)
MCH: 30.7 pg (ref 26.0–34.0)
MCHC: 34.3 g/dL (ref 30.0–36.0)
MCV: 89.3 fL (ref 78.0–100.0)
MPV: 9.2 fL — AB (ref 9.4–12.4)
Monocytes Absolute: 0.3 10*3/uL (ref 0.1–1.0)
Monocytes Relative: 5 % (ref 3–12)
NEUTROS PCT: 61 % (ref 43–77)
Neutro Abs: 3.2 10*3/uL (ref 1.7–7.7)
PLATELETS: 224 10*3/uL (ref 150–400)
RBC: 4.86 MIL/uL (ref 4.22–5.81)
RDW: 13.5 % (ref 11.5–15.5)
WBC: 5.2 10*3/uL (ref 4.0–10.5)

## 2014-11-25 MED ORDER — CARBIDOPA-LEVODOPA ER 48.75-195 MG PO CPCR: 2.0000 | ORAL_CAPSULE | Freq: Three times a day (TID) | ORAL | Status: DC

## 2014-11-25 NOTE — Progress Notes (Signed)
Ian [name] was seen today in the movement disorders clinic for neurologic consultation at the request of his PCP, Dr. Drue Second.  [facility] and saw Dr. Edwin [name] in [place].   This patient is accompanied in the office by his spouse who supplements the history.The consultation is for the evaluation of PD.  I did review Dr. Carloyn [name] notes.  Dr. Hurley [name] notes are not available to me.    Hist first sx was tremor was R arm tremor.  It was in 2011 according to the patient today, but Dr. [name] notes indicate that perhaps this was in 2009.  He was dx with enhanced physiologic tremor.  He was started on propranolol with some help, but made him sluggish.  He went to see Dr. Edwin [name] in 2013 for an opinion in [place].  Dr. 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  He was placed on baclofen and requip.  He thinks that the requip has helped; he is able to turn over in the bed better and move better.  The pt first saw Dr. Rubin [name] on 09/10/13.  Dr. Rubin [name] agreed with the Requip and start the patient on [name], but the patient is currently only on its once per day.  12/04/13 update:  The pt presents to the clinic today for his PD.  He is exercising faithfully.  He attends the PD exercise class through the rehab center. The pt was changed to the IR formulation of carbidopa/levodopa 25/100 last visit and is on it tid.  He is not sure it helps.  Afternoons are better than mornings.  carbidopa/levodopa 25/100 is taken at 8am/3:30 pm and bedtime. He is also on requip xl 6 mg daily.  Continues to have significant tremor.  01/27/14 update:  The patient presents today with his wife, who supplements the history.  The pt has a hx of PD and last visit, we decided to increase his carbidopa/levodopa to 2 po tid.  the patient states that he just felt lightheaded on this medication  dosage and up going back to [name] 3 times per day and then takes [name], half tablet twice per day. He remains on requip xl 6 mg daily.  Overall, he does feel fairly good.  He still has tremor.  He went to see Dr. Rubin [name] since our last visit and again talked about DBS therapy.  Dr. Rubin [name] mentioned to him that he looks much better and he had previously and had [name] score had declined.  Some yelling out in the sleep but this has not been a huge issue.  No sleepwalking/talking.  No falls.  He is exercising faithfully.  05/28/14 update:  The patient is presenting today for followup.  Last visit, I increased his [name] from 6 mg daily to 4 mg twice a day.  He is doing well with this.  Tremor is about the same.  He is on a strange combination of carbidopa levodopa, but when I tried to switch him off the IR version, the patient just felt lightheaded and ended up going back to a combination of IR and CR.  He is currently on 25/100 CR 3 times per day and then takes [name], half tablet twice per day and sometimes three times per day.  He does best in the evening and worst in the AM.   No swallowing troubles.  Has dropped  weight but thinks that it is exercise and was trying to follow gluten free diet. He is walking, doing a spinning class for PD, and has started golfing again, which he hasn't done in years.   Still considering DBS therapy.    08/27/14 update:  Patient presents today for followup.  He is currently on [name], 4mg  twice a day.  He is on a strange combination of carbidopa levodopa, but hasn't been able to tolerate the IR version well.[name].  He is currently on 25/100 CR 2-3 times per day and then 50/200 at night.  He rarely takes the IR now; only if playing golf or if he needs a boost during the day.  He is finding that he is having more tremor.  He is also having dreams at night that are making his sleep feel unrestful.  He is strongly  considering deep brain stimulation.  He asks me about details regarding this today.  He has asks me about potentially getting a refill on his Ativan.  He apparently got this filled from an outside physician about a year and a half ago and only had 10 pills in total and is just now running out.  He takes one pill every 3 or 4 months if he has to go to a function with his wife.  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  He asks me if he can have a refill today.  09/10/14 update:  The patient is seen today unexpectedly.  Last visit, I started [name] 145 mg, 2 tablets 3 times per day.  He is also on [name] 4 mg twice a day.  He called me initially to tell me that the [name] was working great and he felt much better on the medication.  In fact, he states that his PT and OT reported that he looked much better and his eval scores were markedly improved and they asked him what he had done differently.  He states last week was just a great week.  On Saturday, he was just a little lightheaded and his wife checked his orthostatics and he states that they were negative.  This week, however, he began to report some abnormal movements.  The movements were primarily of the left shoulder and in the abdomen. I wanted to see him on this medication before added or changed anything, as I have never seen him with dyskinesia.  Unfortunately, this morning he only took one of his tablets, but he did take 2 tablets at lunch time about 1 hour before coming.  He has not noticed the movements today.  He has noticed much less tremor overall since starting [name].  11/25/14 update:  The patient returns today for follow-up.  He is on [name] 4 mg twice a day and [name] 145 mg, 2 tablets 3 times per day.  His biggest issue is tremor, but states that its under good control today (but he just exercised).  He has developed tendinitis in the elbow.  He has clonazepam for REM behavior  disorder but admits he doesn't use it.  The patient remains very active in our Parkinson's exercise classes.  No falls.  Some days he does have to "fight" the posture and the "shuffling."  He didn't ever get to see Dr. Venetia MaxonStern as he states that his life got hectic moving his mother in law.    Neuroimaging has previously been performed.  It is not available for my  review today.  I did ask the patient to get me.  PREVIOUS MEDICATIONS: Requip and neupro (tried few days only); propranolol helped tremor some  ALLERGIES:  No Known Allergies  CURRENT MEDICATIONS:     Medication List       This list is accurate as of: 11/25/14  9:36 AM.  Always use your most recent med list.               Carbidopa-Levodopa ER 36.25-145 MG Cpcr  Commonly known as:  [name]  Take 2 tablets by mouth 3 (three) times daily.     clonazePAM 0.5 MG tablet  Commonly known as:  KLONOPIN  Take 1 tablet (0.5 mg total) by mouth at bedtime.     Co Q-10 100 MG Caps  Take 100 mg by mouth daily.     D-5000 5000 UNITS Tabs  Generic drug:  Cholecalciferol  Take 1 tablet by mouth daily.     GLUTATHIONE PO  Take by mouth.     LECITHIN PO  Take by mouth.     LORazepam 0.5 MG tablet  Commonly known as:  ATIVAN  Take 1 tablet (0.5 mg total) by mouth as needed for anxiety.     MAGNESIUM PO  Take by mouth daily.     MSM PO  Take by mouth.     MULTI-VITAMINS Tabs  Take 1 tablet by mouth daily.     niacin 500 MG tablet  Take 5,000 mg by mouth at bedtime.     rOPINIRole 4 MG 24 hr tablet  Commonly known as:  [name]  TAKE ONE TABLET BY MOUTH TWICE DAILY     VITAMIN C PO  Take by mouth daily.     vitamin E 400 UNIT capsule  Take 400 Units by mouth 3 (three) times daily.         PAST MEDICAL HISTORY:   Past Medical History  Diagnosis Date  . PD (Parkinson's disease) 09/10/2013    PAST SURGICAL HISTORY:   Past Surgical History  Procedure Laterality Date  . None      SOCIAL HISTORY:    History   Social History  . Marital Status: Married    Spouse Name: N/A    Number of Children: N/A  . Years of Education: N/A   Occupational History  . Not on file.   Social History Main Topics  . Smoking status: Never Smoker   . Smokeless tobacco: Never Used  . Alcohol Use: Yes     Comment: Occ  . Drug Use: Not on file  . Sexual Activity: Not on file   Other Topics Concern  . Not on file   Social History Narrative    FAMILY HISTORY:   Family Status  Relation Status Death Age  . Mother Deceased 24    Breast Cancer  . Father Alive     Diabetes  . Sister Alive     healthy  . Daughter Alive     2, twins  . Son Alive     ROS: A complete 10 system review of systems was obtained and was unremarkable apart from what is mentioned above.  PHYSICAL EXAMINATION:    VITALS:   Filed Vitals:   11/25/14 0935  BP: 130/60  Pulse: 88  Height: 6\' 2"  (1.88 m)  Weight: 168 lb (76.204 kg)   Wt Readings from Last 3 Encounters:  11/25/14 168 lb (76.204 kg)  09/10/14 167 lb (75.751 kg)  08/27/14 165 lb (74.844  kg)    GEN:  The patient appears stated age and is in NAD. HEENT:  Normocephalic, atraumatic.  The mucous membranes are moist. The superficial temporal arteries are without ropiness or tenderness. CV:  RRR Lungs:  CTAB Neck/HEME:  There are no carotid bruits bilaterally.  Neurological examination:  Orientation: The patient is alert and oriented x3. Fund of knowledge is appropriate.  Recent and remote memory are intact.  Attention and concentration are normal.    Able to name objects and repeat phrases. Cranial nerves: There is good facial symmetry.  There is facial hypomimia.  Pupils are equal round and reactive to light bilaterally. The speech is fluent and clear. Soft palate rises symmetrically and there is no tongue deviation. Hearing is intact to conversational tone. Sensation: Sensation is intact to light touch throughout. Motor: Strength is 5/5 in the  bilateral upper and lower extremities.   Shoulder shrug is equal and symmetric.  There is no pronator drift.   Movement examination: Tone: There is moderate to severe rigidity in the right upper extremity, but the left upper extremity only has mild to moderate rigidity, which is much improved. Abnormal movements: There is an intermittent resting tremor in the RLE.   Coordination:  There is  decremation with RAM's, seen primarily with toe taps/heel taps today bilaterally.   Gait and Station: The patient has no difficulty arising out of a deep-seated chair without the use of the hands. The patient's stride length is fairly good There is decreased arm swing bilaterally.  The patient has a negative pull test.      ASSESSMENT/PLAN:  1. idiopathic Parkinson's disease.  The patient has tremor, bradykinesia, rigidity and mild postural instability.  -We discussed the diagnosis as well as pathophysiology of the disease.  We discussed treatment options as well as prognostic indicators.  Patient education was provided.  -Remain on [name]  4 mg twice a day.    -Increase rytary to 195 mg - 2 tablets tid.  Gave him option of going to the rytary 145 mg - 3 po tid but he would like more gradual increase first.   -DBS was again discussed in detail.   -Invited him to 02/27/15 PD event in [place]  -labs today 2.  REM behavior disorder.  -He has clonazepam as needed. 3.  R tennis elbow  -refer to Dr. Katrinka BlazingSmith 4.  follow up in 3-4 months

## 2014-11-25 NOTE — Patient Instructions (Signed)
1. Your provider has requested that you have labwork completed today. [facility] on the first floor of this building before leaving the office today. 2. Appt with Dr Terrilee FilesZach Smith on 12/01/14 at 1:30 pm-  if this is not a good date/time please call [phone] to reschedule. He is located at [facility] - 1st floor.

## 2014-11-26 ENCOUNTER — Ambulatory Visit: Admitting: Neurology

## 2014-12-01 ENCOUNTER — Ambulatory Visit (INDEPENDENT_AMBULATORY_CARE_PROVIDER_SITE_OTHER): Admitting: Family Medicine

## 2014-12-01 ENCOUNTER — Encounter: Payer: Self-pay | Admitting: Family Medicine

## 2014-12-01 ENCOUNTER — Other Ambulatory Visit (INDEPENDENT_AMBULATORY_CARE_PROVIDER_SITE_OTHER)

## 2014-12-01 VITALS — BP 110/62 | HR 80 | Ht 74.0 in | Wt 169.0 lb

## 2014-12-01 DIAGNOSIS — M7710 Lateral epicondylitis, unspecified elbow: Secondary | ICD-10-CM | POA: Insufficient documentation

## 2014-12-01 DIAGNOSIS — M25521 Pain in right elbow: Secondary | ICD-10-CM

## 2014-12-01 DIAGNOSIS — M7711 Lateral epicondylitis, right elbow: Secondary | ICD-10-CM

## 2014-12-01 NOTE — Patient Instructions (Addendum)
Good to see you.  Happy holidays!  Ice 20 minutes 2 times daily. Usually after activity and before bed.  Exercises 3 times a week.   Wear brace day and night for 1 week then nightly for 2 weeks.  Try topical 2 times daily for next 2 weeks.  Vitamin D 2000 IU daily.  See me again in 3-4 weeks.

## 2014-12-01 NOTE — Assessment & Plan Note (Signed)
Lateral Epicondylitis: Elbow anatomy was reviewed, and tendinopathy was explained.  Pt. given a formal rehab program. Series of concentric and eccentric exercises should be done starting with no weight, work up to 1 lb, hammer, etc.  Use counterforce strap if working or using hands.  Formal PT would be beneficial. Emphasized stretching an cross-friction massage Emphasized proper palms up lifting biomechanics to unload ECRB Patient was given a wrist brace at this time. Patient given handout as discussed above. We discussed icing regimen. Patient and will come back and see me again in 3-4 weeks for further evaluation.

## 2014-12-01 NOTE — Progress Notes (Signed)
  [name] Zenola Dezarn D.O. [facility] [address] [place], [place] [address] Phone: [phone] Subjective:     CC: Right elbow pain.  BJY:NWGNFAOZHYHPI:Subjective Ian PurserKenneth [name] is a 53 y.o. male coming in with complaint of right elbow pain. Patient does have a past medical history for parkinsonism and has been treated by neurology. Patient states that over the course of the last several months he started having elbow pain. Right-sided. Worse with certain movements. Patient does have a tremor at baseline and states that it has been a little bit worse recently. States that usually he sits on his hand to try to prevent this and this causes more pain over the lateral aspect of his elbow. Denies any radiation to his hand or any numbness or tingling. Patient puts the severity of 5 out of 10. Does respond to when he decrease his activity. Patient tries to remain active because it helps his Parkinson's. No nighttime awakenings.     Past medical history, social, surgical and family history all reviewed in electronic medical record.   Review of Systems: No headache, visual changes, nausea, vomiting, diarrhea, constipation, dizziness, abdominal pain, skin rash, fevers, chills, night sweats, weight loss, swollen lymph nodes, body aches, joint swelling, muscle aches, chest pain, shortness of breath, mood changes.   Objective Blood pressure 110/62, pulse 80, height 6\' 2"  (1.88 m), weight 169 lb (76.658 kg), SpO2 98 %.  General: No apparent distress alert and oriented x3 mood and affect normal, dressed appropriately. Patient does have a resting tremor at baseline HEENT: Pupils equal, extraocular movements intact  Respiratory: Patient's speak in full sentences and does not appear short of breath  Cardiovascular: No lower extremity edema, non tender, no erythema  Skin: Warm dry intact with no signs of infection or rash on extremities or on axial skeleton.  Abdomen: Soft nontender  Neuro: Cranial nerves II through  XII are intact, neurovascularly intact in all extremities but patient does have some cogwheeling Lymph: No lymphadenopathy of posterior or anterior cervical chain or axillae bilaterally.  Gait normal with good balance and coordination.  MSK:  Non tender with full range of motion and good stability and symmetric strength and tone of shoulders, , wrist, hip, knee and ankles bilaterally.  Elbow: Right Unremarkable to inspection. Range of motion full pronation, supination, flexion, extension. Strength is full to all of the above directions Stable to varus, valgus stress. Negative moving valgus stress test. Tender to palpation over the lateral epicondylar region. Ulnar nerve does not sublux. Negative cubital tunnel Tinel's.  Musculoskeletal ultrasound was performed and interpreted by Terrilee FilesZach Hommer Cunliffe D.O.   Elbow:  Lateral epicondyle and common extensor tendon origin visualized.  Mild hypoechoic changes noted. No true tear appreciated.  Radial head unremarkable and located in annular ligament Medial epicondyle and common flexor tendon origin visualized.  No edema, effusions, or avulsions seen. Ulnar nerve in cubital tunnel unremarkable. Olecranon and triceps insertion visualized and unremarkable without edema, effusion, or avulsion.  No signs olecranon bursitis. Power doppler signal normal.  IMPRESSION:  Lateral epicondylitis     Impression and Recommendations:     This case required medical decision making of moderate complexity.

## 2014-12-24 ENCOUNTER — Ambulatory Visit: Admitting: Family Medicine

## 2015-01-01 ENCOUNTER — Telehealth: Payer: Self-pay | Admitting: Neurology

## 2015-01-01 NOTE — Telephone Encounter (Signed)
[facility] request for medical records received and forwarded to medical records.

## 2015-01-19 ENCOUNTER — Telehealth: Payer: Self-pay | Admitting: Neurology

## 2015-01-19 NOTE — Telephone Encounter (Signed)
Medical records request from [name] received and forwarded to medical records.

## 2015-01-28 ENCOUNTER — Other Ambulatory Visit: Payer: Self-pay | Admitting: Neurology

## 2015-01-28 NOTE — Telephone Encounter (Signed)
Requip refill requested. Per last office note- patient to remain on medication. Refill approved and sent to patient's pharmacy.   

## 2015-02-06 ENCOUNTER — Telehealth: Payer: Self-pay | Admitting: Neurology

## 2015-02-06 NOTE — Telephone Encounter (Signed)
Form completed and records faxed to [facility] at [phone] with confirmation received.

## 2015-02-20 ENCOUNTER — Ambulatory Visit (INDEPENDENT_AMBULATORY_CARE_PROVIDER_SITE_OTHER): Admitting: Neurology

## 2015-02-20 ENCOUNTER — Encounter: Payer: Self-pay | Admitting: Neurology

## 2015-02-20 VITALS — BP 118/60 | HR 76 | Ht 74.0 in | Wt 169.0 lb

## 2015-02-20 DIAGNOSIS — G20 Parkinson's disease: Secondary | ICD-10-CM

## 2015-02-20 DIAGNOSIS — G4752 REM sleep behavior disorder: Secondary | ICD-10-CM

## 2015-02-20 NOTE — Progress Notes (Signed)
Ian [name] was seen today in the movement disorders clinic for neurologic consultation at the request of his PCP, Dr. Drue [name].  He has previously seen  Ian [name],Ian L, MD and saw Dr. Edwin [name] in [place].   This patient is accompanied in the office by his spouse who supplements the history.The consultation is for the evaluation of PD.  I did review [name]'s notes.  [name]'s notes are not available to me.    Hist first sx was tremor was R arm tremor.  It was in 2011 according to the patient today, but [name]'s notes indicate that perhaps this was in 2009.  He was dx with enhanced physiologic tremor.  He was started on propranolol with some help, but made him sluggish.  He went to see Dr. Edwin [name] in 2013 for an opinion in [name].  Dr. 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  He was placed on baclofen and requip.  He thinks that the requip has helped; he is able to turn over in the bed better and move better.  The pt first saw [name] on 09/10/13.  [name] agreed with the Requip and start the patient on [name], but the patient is currently only on its once per day.  12/04/13 update:  The pt presents to the clinic today for his PD.  He is exercising faithfully.  He attends the PD exercise class through the rehab center. The pt was changed to the IR formulation of carbidopa/levodopa 25/100 last visit and is on it tid.  He is not sure it helps.  Afternoons are better than mornings.  carbidopa/levodopa 25/100 is taken at 8am/3:30 pm and bedtime. He is also on requip xl 6 mg daily.  Continues to have significant tremor.  01/27/14 update:  The patient presents today with his wife, who supplements the history.  The pt has a hx of PD and last visit, we decided to increase his carbidopa/levodopa to 2 po tid.  the patient states that he just felt lightheaded on this medication dosage  and up going back to [name] 3 times per day and then takes [name], half tablet twice per day. He remains on requip xl 6 mg daily.  Overall, he does feel fairly good.  He still has tremor.  He went to see [name] since our last visit and again talked about DBS therapy.  [name] mentioned to him that he looks much better and he had previously and had [name] score had declined.  Some yelling out in the sleep but this has not been a huge issue.  No sleepwalking/talking.  No falls.  He is exercising faithfully.  05/28/14 update:  The patient is presenting today for followup.  Last visit, I increased his [name] from 6 mg daily to 4 mg twice a day.  He is doing well with this.  Tremor is about the same.  He is on a strange combination of carbidopa levodopa, but when I tried to switch him off the IR version, the patient just felt lightheaded and ended up going back to a combination of IR and CR.  He is currently on 25/100 CR 3 times per day and then takes [name], half tablet twice per day and sometimes three times per day.  He does best in the evening and worst in the AM.   No swallowing troubles.  Has  dropped weight but thinks that it is exercise and was trying to follow gluten free diet. He is walking, doing a spinning class for PD, and has started golfing again, which he hasn't done in years.   Still considering DBS therapy.    08/27/14 update:  Patient presents today for followup.  He is currently on [name], 4mg  twice a day.  He is on a strange combination of carbidopa levodopa, but hasn't been able to tolerate the IR version well.[name].  He is currently on 25/100 CR 2-3 times per day and then 50/200 at night.  He rarely takes the IR now; only if playing golf or if he needs a boost during the day.  He is finding that he is having more tremor.  He is also having dreams at night that are making his sleep feel unrestful.  He is strongly  considering deep brain stimulation.  He asks me about details regarding this today.  He has asks me about potentially getting a refill on his Ativan.  He apparently got this filled from an outside physician about a year and a half ago and only had 10 pills in total and is just now running out.  He takes one pill every 3 or 4 months if he has to go to a function with his wife.  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  He asks me if he can have a refill today.  09/10/14 update:  The patient is seen today unexpectedly.  Last visit, I started [name] 145 mg, 2 tablets 3 times per day.  He is also on [name] 4 mg twice a day.  He called me initially to tell me that the [name] was working great and he felt much better on the medication.  In fact, he states that his PT and OT reported that he looked much better and his eval scores were markedly improved and they asked him what he had done differently.  He states last week was just a great week.  On Saturday, he was just a little lightheaded and his wife checked his orthostatics and he states that they were negative.  This week, however, he began to report some abnormal movements.  The movements were primarily of the left shoulder and in the abdomen. I wanted to see him on this medication before added or changed anything, as I have never seen him with dyskinesia.  Unfortunately, this morning he only took one of his tablets, but he did take 2 tablets at lunch time about 1 hour before coming.  He has not noticed the movements today.  He has noticed much less tremor overall since starting [name].  11/25/14 update:  The patient returns today for follow-up.  He is on [name] 4 mg twice a day and [name] 145 mg, 2 tablets 3 times per day.  His biggest issue is tremor, but states that its under good control today (but he just exercised).  He has developed tendinitis in the elbow.  He has clonazepam for REM behavior  disorder but admits he doesn't use it.  The patient remains very active in our Parkinson's exercise classes.  No falls.  Some days he does have to "fight" the posture and the "shuffling."  He didn't ever get to see Dr. Venetia MaxonStern as he states that his life got hectic moving his mother in law.    02/20/15 update:  The patient returns today, accompanied by his wife  who supplements the history.  Last visit, I increased his [name] to 195 mg, and he is taking 2 tablets 3 times a day.  He is also on [name] 4 mg twice a day.  He reports that he has not been doing as well because stress levels have been increased.  His mother-in-law, who is living with them, died unexpectedly.  Because of that, he has had more trouble sleeping as well.  He is still faithfully attending exercise classes and is involved in the Parkinson's community.  He has noticed more rigidity and tremor.  He is taking melatonin, 5 Mill grams at night to help him sleep but does not want to use clonazepam, even though he has at home.  Neuroimaging has previously been performed.  It is not available for my review today.  I did ask the patient to get me.  PREVIOUS MEDICATIONS: Requip and neupro (tried few days only); propranolol helped tremor some  ALLERGIES:  No Known Allergies  CURRENT MEDICATIONS:     Medication List       This list is accurate as of: 02/20/15  3:19 PM.  Always use your most recent med list.               Carbidopa-Levodopa ER 48.75-195 MG Cpcr  Commonly known as:  [name]  Take 2 tablets by mouth 3 (three) times daily.     clonazePAM 0.5 MG tablet  Commonly known as:  KLONOPIN  Take 1 tablet (0.5 mg total) by mouth at bedtime.     Co Q-10 100 MG Caps  Take 100 mg by mouth daily.     D-5000 5000 UNITS Tabs  Generic drug:  Cholecalciferol  Take 1 tablet by mouth daily.     GLUTATHIONE PO  Take by mouth.     LECITHIN PO  Take by mouth.     LORazepam 0.5 MG tablet  Commonly known as:  ATIVAN  Take 1 tablet  (0.5 mg total) by mouth as needed for anxiety.     MAGNESIUM PO  Take by mouth daily.     MULTI-VITAMINS Tabs  Take 1 tablet by mouth daily.     niacin 500 MG tablet  Take 5,000 mg by mouth at bedtime.     rOPINIRole 4 MG 24 hr tablet  Commonly known as:  [name]  TAKE ONE TABLET BY MOUTH TWICE DAILY     VITAMIN C PO  Take by mouth daily.     vitamin E 400 UNIT capsule  Take 400 Units by mouth 3 (three) times daily.         PAST MEDICAL HISTORY:   Past Medical History  Diagnosis Date  . PD (Parkinson's disease) 09/10/2013    PAST SURGICAL HISTORY:   Past Surgical History  Procedure Laterality Date  . None      SOCIAL HISTORY:   History   Social History  . Marital Status: Married    Spouse Name: N/A  . Number of Children: N/A  . Years of Education: N/A   Occupational History  . Not on file.   Social History Main Topics  . Smoking status: Never Smoker   . Smokeless tobacco: Never Used  . Alcohol Use: Yes     Comment: Occ  . Drug Use: Not on file  . Sexual Activity: Not on file   Other Topics Concern  . Not on file   Social History Narrative    FAMILY HISTORY:   Family Status  Relation Status Death Age  . Mother Deceased [id number]    Breast Cancer  . Father Alive     Diabetes  . Sister Alive     healthy  . Daughter Alive     2, twins  . Son Alive     ROS: A complete 10 system review of systems was obtained and was unremarkable apart from what is mentioned above.  PHYSICAL EXAMINATION:    VITALS:   Filed Vitals:   02/20/15 1509  BP: 118/60  Pulse: 76  Height: 6\' 2"  (1.88 m)  Weight: 169 lb (76.658 kg)   Wt Readings from Last 3 Encounters:  02/20/15 169 lb (76.658 kg)  12/01/14 169 lb (76.658 kg)  11/25/14 168 lb (76.204 kg)    GEN:  The patient appears stated age and is in NAD. HEENT:  Normocephalic, atraumatic.  The mucous membranes are moist. The superficial temporal arteries are without ropiness or tenderness. CV:  RRR Lungs:   CTAB Neck/HEME:  There are no carotid bruits bilaterally.  Neurological examination:  Orientation: The patient is alert and oriented x3. Fund of knowledge is appropriate.  Recent and remote memory are intact.  Attention and concentration are normal.    Able to name objects and repeat phrases. Cranial nerves: There is good facial symmetry.  There is facial hypomimia.  Pupils are equal round and reactive to light bilaterally. The speech is fluent and clear. Soft palate rises symmetrically and there is no tongue deviation. Hearing is intact to conversational tone. Sensation: Sensation is intact to light touch throughout. Motor: Strength is 5/5 in the bilateral upper and lower extremities.   Shoulder shrug is equal and symmetric.  There is no pronator drift.   Movement examination: Tone: While there is usually more severe rigidity on the right, today there is more severe rigidity in the left upper extremity and it is moderate to severe.  There is mild to moderate rigidity on the right. Abnormal movements: There is an intermittent resting tremor in the RLE and left lower extremity Coordination:  There is  decremation with RAM's, seen primarily with toe taps/heel taps today bilaterally.   Gait and Station: The patient has no difficulty arising out of a deep-seated chair without the use of the hands. The patient's stride length is fairly good There is decreased arm swing bilaterally.  The patient has a negative pull test.      ASSESSMENT/PLAN:  1. idiopathic Parkinson's disease.  The patient has tremor, bradykinesia, rigidity and mild postural instability.  -We discussed the diagnosis as well as pathophysiology of the disease.  We discussed treatment options as well as prognostic indicators.  Patient education was provided.  -Remain on [name]  4 mg twice a day.    -I think he would benefit from an increase in the [name], but he wants to hold on that and be more aware of his protein intake, as he  is often taking a protein supplement with his [name].  He will let me know if he wants to increase the [name] from 195 mg, 2 tablets 3 times per day.  -DBS was again discussed in detail.   -Discussed the boxing program for Parkinson's patients in [place].  -Completed paperwork for the patient to [facility] regarding his diagnosis. 2.  REM behavior disorder.  -He has clonazepam as needed but he does not wish to use that and wishes to use melatonin, 5 mg alone for sleep/insomnia, although this will not help  REM behavior disorder. 3.  R tennis elbow  -Patient feels that this is somewhat better.  He has seen Dr. Katrinka Blazing in the past. 4.  follow up in 3-4 months

## 2015-02-23 ENCOUNTER — Telehealth: Payer: Self-pay | Admitting: Neurology

## 2015-02-23 NOTE — Telephone Encounter (Signed)
Patient made aware letter written and at the front for pick up.

## 2015-02-24 ENCOUNTER — Ambulatory Visit: Admitting: Neurology

## 2015-02-26 ENCOUNTER — Telehealth: Payer: Self-pay | Admitting: *Deleted

## 2015-02-26 NOTE — Telephone Encounter (Signed)
Patient needed letter to say "to whom it may concern". Letter changed, signed and at the front for patient pick up.

## 2015-02-26 NOTE — Telephone Encounter (Signed)
Patient called in reference to a letter you did for him C/b [phone]

## 2015-04-27 ENCOUNTER — Other Ambulatory Visit: Payer: Self-pay | Admitting: Neurology

## 2015-04-28 NOTE — Telephone Encounter (Signed)
[name] refill requested. Per last office note- patient to remain on medication. Refill approved and sent to patient's pharmacy.   

## 2015-05-25 ENCOUNTER — Encounter: Payer: Self-pay | Admitting: Neurology

## 2015-05-25 ENCOUNTER — Ambulatory Visit (INDEPENDENT_AMBULATORY_CARE_PROVIDER_SITE_OTHER): Admitting: Neurology

## 2015-05-25 VITALS — BP 138/84 | HR 92 | Ht 74.0 in | Wt 165.0 lb

## 2015-05-25 DIAGNOSIS — G20 Parkinson's disease: Secondary | ICD-10-CM

## 2015-05-25 DIAGNOSIS — G4752 REM sleep behavior disorder: Secondary | ICD-10-CM | POA: Diagnosis not present

## 2015-05-25 NOTE — Progress Notes (Signed)
Ian PurserKenneth [name] was seen today in the movement disorders clinic for neurologic consultation at the request of his PCP, Dr. Drue [name].  He has previously seen  Ian [name] and saw Dr. Edwin [name] in [place].   This patient is accompanied in the office by his spouse who supplements the history.The consultation is for the evaluation of PD.  I did review Dr. Carloyn [name]'s notes.  Dr. Hurley [name]'s notes are not available to me.    Hist first sx was tremor was R arm tremor.  It was in 2011 according to the patient today, but Dr. Faye [name]'s notes indicate that perhaps this was in 2009.  He was dx with enhanced physiologic tremor.  He was started on propranolol with some help, but made him sluggish.  He went to see Dr. Edwin [name] in 2013 for an opinion in [name].  Dr. 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  He was placed on baclofen and requip.  He thinks that the requip has helped; he is able to turn over in the bed better and move better.  The pt first saw Dr. Rubin [name] on 09/10/13.  Dr. Rubin [name] agreed with the Requip and start the patient on [name], but the patient is currently only on its once per day.  12/04/13 update:  The pt presents to the clinic today for his PD.  He is exercising faithfully.  He attends the PD exercise class through the rehab center. The pt was changed to the IR formulation of carbidopa/levodopa 25/100 last visit and is on it tid.  He is not sure it helps.  Afternoons are better than mornings.  carbidopa/levodopa 25/100 is taken at 8am/3:30 pm and bedtime. He is also on requip xl 6 mg daily.  Continues to have significant tremor.  01/27/14 update:  The patient presents today with his wife, who supplements the history.  The pt has a hx of PD and last visit, we decided to increase his carbidopa/levodopa to 2 po tid.  the patient states that he just felt lightheaded on this medication dosage  and up going back to [name] 3 times per day and then takes [name], half tablet twice per day. He remains on requip xl 6 mg daily.  Overall, he does feel fairly good.  He still has tremor.  He went to see Dr. Rubin [name] since our last visit and again talked about DBS therapy.  Dr. Rubin [name] mentioned to him that he looks much better and he had previously and had [name] score had declined.  Some yelling out in the sleep but this has not been a huge issue.  No sleepwalking/talking.  No falls.  He is exercising faithfully.  05/28/14 update:  The patient is presenting today for followup.  Last visit, I increased his [name] from 6 mg daily to 4 mg twice a day.  He is doing well with this.  Tremor is about the same.  He is on a strange combination of carbidopa levodopa, but when I tried to switch him off the IR version, the patient just felt lightheaded and ended up going back to a combination of IR and CR.  He is currently on 25/100 CR 3 times per day and then takes [name], half tablet twice per day and sometimes three times per day.  He does best in the evening and worst in the AM.   No swallowing troubles.  Has  dropped weight but thinks that it is exercise and was trying to follow gluten free diet. He is walking, doing a spinning class for PD, and has started golfing again, which he hasn't done in years.   Still considering DBS therapy.    08/27/14 update:  Patient presents today for followup.  He is currently on [name], 4mg  twice a day.  He is on a strange combination of carbidopa levodopa, but hasn't been able to tolerate the IR version well.[name].  He is currently on 25/100 CR 2-3 times per day and then 50/200 at night.  He rarely takes the IR now; only if playing golf or if he needs a boost during the day.  He is finding that he is having more tremor.  He is also having dreams at night that are making his sleep feel unrestful.  He is strongly  considering deep brain stimulation.  He asks me about details regarding this today.  He has asks me about potentially getting a refill on his Ativan.  He apparently got this filled from an outside physician about a year and a half ago and only had 10 pills in total and is just now running out.  He takes one pill every 3 or 4 months if he has to go to a function with his wife.  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  He asks me if he can have a refill today.  09/10/14 update:  The patient is seen today unexpectedly.  Last visit, I started [name] 145 mg, 2 tablets 3 times per day.  He is also on [name] 4 mg twice a day.  He called me initially to tell me that the [name] was working great and he felt much better on the medication.  In fact, he states that his PT and OT reported that he looked much better and his eval scores were markedly improved and they asked him what he had done differently.  He states last week was just a great week.  On Saturday, he was just a little lightheaded and his wife checked his orthostatics and he states that they were negative.  This week, however, he began to report some abnormal movements.  The movements were primarily of the left shoulder and in the abdomen. I wanted to see him on this medication before added or changed anything, as I have never seen him with dyskinesia.  Unfortunately, this morning he only took one of his tablets, but he did take 2 tablets at lunch time about 1 hour before coming.  He has not noticed the movements today.  He has noticed much less tremor overall since starting [name].  11/25/14 update:  The patient returns today for follow-up.  He is on [name] 4 mg twice a day and [name] 145 mg, 2 tablets 3 times per day.  His biggest issue is tremor, but states that its under good control today (but he just exercised).  He has developed tendinitis in the elbow.  He has clonazepam for REM behavior  disorder but admits he doesn't use it.  The patient remains very active in our Parkinson's exercise classes.  No falls.  Some days he does have to "fight" the posture and the "shuffling."  He didn't ever get to see Dr. Venetia MaxonStern as he states that his life got hectic moving his mother in law.    02/20/15 update:  The patient returns today, accompanied by his wife  who supplements the history.  Last visit, I increased his [name] to 195 mg, and he is taking 2 tablets 3 times a day.  He is also on [name] 4 mg twice a day.  He reports that he has not been doing as well because stress levels have been increased.  His mother-in-law, who is living with them, died unexpectedly.  Because of that, he has had more trouble sleeping as well.  He is still faithfully attending exercise classes and is involved in the Parkinson's community.  He has noticed more rigidity and tremor.  He is taking melatonin, 5 Mill grams at night to help him sleep but does not want to use clonazepam, even though he has at home.  05/24/25 update:  The patient returns today for follow-up.  His wife accompanies him and supplements the history.  He is on [name] 4 mg twice a day and [name] 195 mg, 2 tablets 3 times per day.  He has not wanted to increase the dose, although he likely could use more levodopa. States that he is more tremulous right now and attributes that to the fact that he has had a GI illness recently, which made sx's worse.  He has had emesis (one episode).  No diarrhea.  No fevers.    He has not had any falls.  He denies any hallucinations.  No confusion.  No lightheadedness or near syncope.  He continues to exercise faithfully.  Neuroimaging has previously been performed.  It is not available for my review today.  I did ask the patient to get me.  PREVIOUS MEDICATIONS: Requip and neupro (tried few days only); propranolol helped tremor some  ALLERGIES:  No Known Allergies  CURRENT MEDICATIONS:     Medication List       This  list is accurate as of: 05/25/15  9:44 AM.  Always use your most recent med list.               clonazePAM 0.5 MG tablet  Commonly known as:  KLONOPIN  Take 1 tablet (0.5 mg total) by mouth at bedtime.     Co Q-10 100 MG Caps  Take 100 mg by mouth daily.     D-5000 5000 UNITS Tabs  Generic drug:  Cholecalciferol  Take 1 tablet by mouth daily.     GLUTATHIONE PO  Take by mouth.     LECITHIN PO  Take by mouth.     LORazepam 0.5 MG tablet  Commonly known as:  ATIVAN  Take 1 tablet (0.5 mg total) by mouth as needed for anxiety.     MAGNESIUM PO  Take by mouth daily.     MULTI-VITAMINS Tabs  Take 1 tablet by mouth daily.     niacin 500 MG tablet  Take 5,000 mg by mouth at bedtime.     rOPINIRole 4 MG 24 hr tablet  Commonly known as:  [name]  TAKE ONE TABLET BY MOUTH TWICE DAILY     [name] 48.75-195 MG Cpcr  Generic drug:  Carbidopa-Levodopa ER  TAKE TWO CAPSULES BY MOUTH THREE TIMES DAILY     VITAMIN C PO  Take by mouth daily.     vitamin E 400 UNIT capsule  Take 400 Units by mouth 3 (three) times daily.         PAST MEDICAL HISTORY:   Past Medical History  Diagnosis Date  . PD (Parkinson's disease) 09/10/2013    PAST SURGICAL HISTORY:   Past Surgical History  Procedure Laterality Date  . None      SOCIAL HISTORY:   History   Social History  . Marital Status: Married    Spouse Name: N/A  . Number of Children: N/A  . Years of Education: N/A   Occupational History  . Not on file.   Social History Main Topics  . Smoking status: Never Smoker   . Smokeless tobacco: Never Used  . Alcohol Use: Yes     Comment: Occ  . Drug Use: Not on file  . Sexual Activity: Not on file   Other Topics Concern  . Not on file   Social History Narrative    FAMILY HISTORY:   Family Status  Relation Status Death Age  . Mother Deceased 53    Breast Cancer  . Father Alive     Diabetes  . Sister Alive     healthy  . Daughter Alive     2, twins  . Son  Alive     ROS: A complete 10 system review of systems was obtained and was unremarkable apart from what is mentioned above.  PHYSICAL EXAMINATION:    VITALS:   There were no vitals filed for this visit. Wt Readings from Last 3 Encounters:  02/20/15 169 lb (76.658 kg)  12/01/14 169 lb (76.658 kg)  11/25/14 168 lb (76.204 kg)    GEN:  The patient appears stated age and is in NAD. HEENT:  Normocephalic, atraumatic.  The mucous membranes are moist. The superficial temporal arteries are without ropiness or tenderness. CV:  RRR Lungs:  CTAB Neck/HEME:  There are no carotid bruits bilaterally.  Neurological examination:  Orientation: The patient is alert and oriented x3. Fund of knowledge is appropriate.  Recent and remote memory are intact.  Attention and concentration are normal.    Able to name objects and repeat phrases. Cranial nerves: There is good facial symmetry.  There is facial hypomimia.  Pupils are equal round and reactive to light bilaterally. The speech is fluent and clear. Soft palate rises symmetrically and there is no tongue deviation. Hearing is intact to conversational tone. Sensation: Sensation is intact to light touch throughout. Motor: Strength is 5/5 in the bilateral upper and lower extremities.   Shoulder shrug is equal and symmetric.  There is no pronator drift.   Movement examination: Tone: There is mod-severe rigidity in the bilateral upper extremity and mod rigidity in the RLE as well.  Tone was good in the L lower extremity Abnormal movements: There is a near constant resting tremor in the RLE and bilateral lower extremities Coordination:  There is  decremation with RAM's, seen primarily with toe taps/heel taps today bilaterally.   Gait and Station: The patient has no difficulty arising out of a deep-seated chair without the use of the hands. The patient's stride length is fairly good There is decreased arm swing bilaterally.   He is able to run down the hall  well.   The patient has a negative pull test.      ASSESSMENT/PLAN:  1. idiopathic Parkinson's disease.  The patient has tremor, bradykinesia, rigidity and mild postural instability.  -We discussed the diagnosis as well as pathophysiology of the disease.  We discussed treatment options as well as prognostic indicators.  Patient education was provided.  -Remain on [name]  4 mg twice a day.    -I think he would benefit from an increase in the [name], but he thinks that he is just rigid from his gastroenteritis  he currently has.  I told him that I would at least try to take rytary 195 mg 3 po tid (or he has some 145 mg left at home and he can add that tid to the rytary 195 mg - 2 po tid he is taking).  He states he may just do this until he gets over his GI illness and then go back as he felt that he was doing good until then. He will let me know if he needs a new RX for the increase.  -DBS was again discussed in detail.   -Discussed the boxing program for Parkinson's patients in [place] and gave him a handout on that today.  -Much greater than 50% of this visit was spent in counseling with the patient and the family.  Total face to face time:  30 min 2.  REM behavior disorder.  -He has clonazepam as needed but he does not wish to use that and wishes to use melatonin, 5 mg alone for sleep/insomnia, although this will not help REM behavior disorder. 3.  follow up in 3-4 months

## 2015-06-10 ENCOUNTER — Encounter (HOSPITAL_COMMUNITY): Payer: Self-pay | Admitting: Family Medicine

## 2015-06-10 ENCOUNTER — Emergency Department (HOSPITAL_COMMUNITY)
Admission: EM | Admit: 2015-06-10 | Discharge: 2015-06-10 | Disposition: A | Discharge status: Home / Self Care | Attending: Emergency Medicine | Admitting: Emergency Medicine

## 2015-06-10 ENCOUNTER — Telehealth: Payer: Self-pay | Admitting: Neurology

## 2015-06-10 DIAGNOSIS — H0520 Unspecified exophthalmos: Secondary | ICD-10-CM | POA: Diagnosis not present

## 2015-06-10 DIAGNOSIS — R55 Syncope and collapse: Secondary | ICD-10-CM | POA: Insufficient documentation

## 2015-06-10 DIAGNOSIS — G250 Essential tremor: Secondary | ICD-10-CM | POA: Diagnosis not present

## 2015-06-10 DIAGNOSIS — R251 Tremor, unspecified: Secondary | ICD-10-CM | POA: Diagnosis present

## 2015-06-10 DIAGNOSIS — G20 Parkinson's disease: Secondary | ICD-10-CM | POA: Insufficient documentation

## 2015-06-10 DIAGNOSIS — Z79899 Other long term (current) drug therapy: Secondary | ICD-10-CM | POA: Insufficient documentation

## 2015-06-10 LAB — COMPREHENSIVE METABOLIC PANEL
ALBUMIN: 3.9 g/dL (ref 3.5–5.0)
ALT: 40 U/L (ref 17–63)
AST: 27 U/L (ref 15–41)
Alkaline Phosphatase: 69 U/L (ref 38–126)
Anion gap: 7 (ref 5–15)
BUN: 14 mg/dL (ref 6–20)
CALCIUM: 9.1 mg/dL (ref 8.9–10.3)
CO2: 22 mmol/L (ref 22–32)
Chloride: 108 mmol/L (ref 101–111)
Creatinine, Ser: 0.91 mg/dL (ref 0.61–1.24)
GFR calc Af Amer: >60 mL/min (ref >60)
Glucose, Bld: 105 mg/dL — ABNORMAL HIGH (ref 65–99)
Potassium: 4.7 mmol/L (ref 3.5–5.1)
Sodium: 137 mmol/L (ref 135–145)
Total Bilirubin: 1 mg/dL (ref 0.3–1.2)
Total Protein: 6.3 g/dL — ABNORMAL LOW (ref 6.5–8.1)

## 2015-06-10 LAB — URINALYSIS, ROUTINE W REFLEX MICROSCOPIC
BILIRUBIN URINE: NEGATIVE
GLUCOSE, UA: NEGATIVE mg/dL
Hgb urine dipstick: NEGATIVE
Ketones, ur: 15 mg/dL — AB
Leukocytes, UA: NEGATIVE
NITRITE: NEGATIVE
PH: 5 (ref 5.0–8.0)
Protein, ur: NEGATIVE mg/dL
Specific Gravity, Urine: 1.018 (ref 1.005–1.030)
Urobilinogen, UA: 0.2 mg/dL (ref 0.0–1.0)

## 2015-06-10 LAB — CBC WITH DIFFERENTIAL/PLATELET
Basophils Absolute: 0 10*3/uL (ref 0.0–0.1)
Basophils Relative: 0 % (ref 0–1)
EOS PCT: 0 % (ref 0–5)
Eosinophils Absolute: 0 10*3/uL (ref 0.0–0.7)
HCT: 44.4 % (ref 39.0–52.0)
HEMOGLOBIN: 15.3 g/dL (ref 13.0–17.0)
LYMPHS ABS: 1.3 10*3/uL (ref 0.7–4.0)
LYMPHS PCT: 15 % (ref 12–46)
MCH: 30.7 pg (ref 26.0–34.0)
MCHC: 34.5 g/dL (ref 30.0–36.0)
MCV: 89.2 fL (ref 78.0–100.0)
MONO ABS: 0.3 10*3/uL (ref 0.1–1.0)
Monocytes Relative: 4 % (ref 3–12)
NEUTROS PCT: 81 % — AB (ref 43–77)
Neutro Abs: 6.9 10*3/uL (ref 1.7–7.7)
Platelets: 249 10*3/uL (ref 150–400)
RBC: 4.98 MIL/uL (ref 4.22–5.81)
RDW: 14 % (ref 11.5–15.5)
WBC: 8.6 10*3/uL (ref 4.0–10.5)

## 2015-06-10 LAB — I-STAT TROPONIN, ED: Troponin i, poc: 0 ng/mL (ref 0.00–0.08)

## 2015-06-10 MED ORDER — SODIUM CHLORIDE 0.9 % IV BOLUS (SEPSIS)
1000.0000 mL | Freq: Once | INTRAVENOUS | Status: AC
  Administered 2015-06-10: 1000 mL via INTRAVENOUS

## 2015-06-10 NOTE — ED Notes (Signed)
Pt here for increase in tremors, weight loss and he feels like he is going to pass out.

## 2015-06-10 NOTE — Telephone Encounter (Signed)
Pt wife Archie Patten called and wants to speak with you again about dosage [phone]

## 2015-06-10 NOTE — Discharge Instructions (Signed)
Please resume taking your [name] and Requip as previously scheduled and follow up closely with your neurologist and your primary care provider.  Return to ER if your condition worsen or if you have other concerns.   Parkinson Disease Parkinson disease is a disorder of the central nervous system, which includes the brain and spinal cord. A person with this disease slowly loses the ability to completely control body movements. Within the brain, there is a group of nerve cells (basal ganglia) that help control movement. The basal ganglia are damaged and do not work properly in a person with Parkinson disease. In addition, the basal ganglia produce and use a brain chemical called dopamine. The dopamine chemical sends messages to other parts of the body to control and coordinate body movements. Dopamine levels are low in a person with Parkinson disease. If the dopamine levels are low, then the body does not receive the correct messages it needs to move normally.  CAUSES  The exact reason why the basal ganglia get damaged is not known. Some medical researchers have thought that infection, genes, environment, and certain medicines may contribute to the cause.  SYMPTOMS   An early symptom of Parkinson disease is often an uncontrolled shaking (tremor) of the hands. The tremor will often disappear when the affected hand is consciously used.  As the disease progresses, walking, talking, getting out of a chair, and new movements become more difficult.  Muscles get stiff and movements become slower.  Balance and coordination become harder.  Depression, trouble swallowing, urinary problems, constipation, and sleep problems can occur.  Later in the disease, memory and thought processes may deteriorate. DIAGNOSIS  There are no specific tests to diagnose Parkinson disease. You may be referred to a neurologist for evaluation. Your caregiver will ask about your medical history, symptoms, and perform a physical  exam. Blood tests and imaging tests of your brain may be performed to rule out other diseases. The imaging tests may include an MRI or a CT scan. TREATMENT  The goal of treatment is to relieve symptoms. Medicines may be prescribed once the symptoms become troublesome. Medicine will not stop the progression of the disease, but medicine can make movement and balance better and help control tremors. Speech and occupational therapy may also be prescribed. Sometimes, surgical treatment of the brain can be done in young people. HOME CARE INSTRUCTIONS  Get regular exercise and rest periods during the day to help prevent exhaustion and depression.  If getting dressed becomes difficult, replace buttons and zippers with Velcro and elastic on your clothing.  Take all medicine as directed by your caregiver.  Install grab bars or railings in your home to prevent falls.  Go to speech or occupational therapy as directed.  Keep all follow-up visits as directed by your caregiver. SEEK MEDICAL CARE IF:  Your symptoms are not controlled with your medicine.  You fall.  You have trouble swallowing or choke on your food. MAKE SURE YOU:  Understand these instructions.  Will watch your condition.  Will get help right away if you are not doing well or get worse. Document Released: 12/02/2000 Document Revised: 04/01/2013 Document Reviewed: 01/04/2012 [facility] Patient Information 2015 [place], [place]. This information is not intended to replace advice given to you by your health care provider. Make sure you discuss any questions you have with your health care provider.

## 2015-06-10 NOTE — Telephone Encounter (Signed)
Pts wife Dorisann Frames called and wanted to get him in today to see Dr [name] because she said his Tremor wont stop and he is exhausted and wants to be seen/Dawn CB# [phone]

## 2015-06-10 NOTE — ED Provider Notes (Signed)
CSN: [id number]     Arrival date & time 06/10/15  1051 History   First MD Initiated Contact with Patient 06/10/15 1119     Chief Complaint  Patient presents with  . Tremors  . Dizziness     (Consider location/radiation/quality/duration/timing/severity/associated sxs/prior Treatment) HPI   54 year old male with history of Parkinson's disease who presents for evaluation of near syncope. Patient reports he was diagnosed with Parkinson's one and half year ago. He was placed on [name] and Requip which he has been taken but discontinue on June 1 without the knowledge of his doctor because he would like to try an alternative therapy. States that he has been using immunotherapy at the recommendation of another provider for possibly 3 weeks. However within that time, he has an 8 pound weight loss, feeling generalized fatigue, increased tremors, mental fogginess, stiffness, and increase dizziness. He did attempt to follow-up with his doctor today but she was not available and decided to come to the ER for further evaluation. His primary complaint is generalized weakness, with decrease in appetite. He denies having any significant fever, chills, severe headache, chest pain, worsening breathing, abdominal cramping, focal numbness. He is having difficulty performing his daily activity living due to his aggressiveness symptoms.  Past Medical History  Diagnosis Date  . PD (Parkinson's disease) 09/10/2013   Past Surgical History  Procedure Laterality Date  . None     Family History  Problem Relation Age of Onset  . Diabetes Other   . Cancer Other    History  Substance Use Topics  . Smoking status: Never Smoker   . Smokeless tobacco: Never Used  . Alcohol Use: Yes     Comment: Occ    Review of Systems  All other systems reviewed and are negative.     Allergies  Review of patient's allergies indicates no known allergies.  Home Medications   Prior to Admission medications   Medication  Sig Start Date End Date Taking? Authorizing Provider  Ascorbic Acid (VITAMIN C PO) Take by mouth daily.    Historical Provider, MD  Cholecalciferol (D-5000) 5000 UNITS TABS Take 1 tablet by mouth daily.     Historical Provider, MD  Coenzyme Q10 (CO Q-10) 100 MG CAPS Take 100 mg by mouth daily.     Historical Provider, MD  GLUTATHIONE PO Take by mouth.    Historical Provider, MD  LECITHIN PO Take by mouth.    Historical Provider, MD  MAGNESIUM PO Take by mouth daily.    Historical Provider, MD  Multiple Vitamin (MULTI-VITAMINS) TABS Take 1 tablet by mouth daily.     Historical Provider, MD  niacin 500 MG tablet Take 5,000 mg by mouth at bedtime.    Historical Provider, MD  rOPINIRole ([name]) 4 MG 24 hr tablet TAKE ONE TABLET BY MOUTH TWICE DAILY  01/28/15   Ian S [name], DO  [name] 48.75-195 MG CPCR TAKE TWO CAPSULES BY MOUTH THREE TIMES DAILY 04/28/15   Ian Batty [name], DO  vitamin E 400 UNIT capsule Take 400 Units by mouth 3 (three) times daily.    Historical Provider, MD   BP 122/85 mmHg  Pulse 91  Temp(Src) 97.7 F (36.5 C) (Oral)  Resp 18  SpO2 99% Physical Exam  Constitutional: He is oriented to person, place, and time.   Caucasian male, with unintentional tremors, appears fatigued.  HENT:  Head: Normocephalic and atraumatic.  Mild exophthalmos to both eyes.  Eyes: Conjunctivae and EOM are normal. Pupils are equal, round,  and reactive to light.  Neck: Normal range of motion. Neck supple.  Cardiovascular: Normal rate and regular rhythm.   Pulmonary/Chest: Effort normal and breath sounds normal.  Abdominal: Soft. There is no tenderness.  Neurological: He is alert and oriented to person, place, and time. Gait (Shuffling gait) abnormal. GCS eye subscore is 4. GCS verbal subscore is 5. GCS motor subscore is 6.  Essential tremor noted through all 4 extremities. Decreased grip strength to both hands. Normal range of motion throughout all 4 extremities.  Skin: No rash noted.  Nursing  note and vitals reviewed.   ED Course  Procedures (including critical care time)  11:46 AM Patient with history of Parkinson's, not following his recommended regimen due to trying out a alternative treatment here with increased central tremors, weight loss and now near syncope. Workup initiated.  11:49 AM Initial EKG shows ST elevations of the inferior lead with possible inferior infarct. Patient however is without chest pain and has no significant cardiac risk factor except for age. Will repeat EKG.  12:35 PM EKG continues to show inferior ST elevation. Plan to consult cardiology for further recommendation.  12:38 PM Appreciate consultation from Cardiologist Dr. Clifton [name] who have reviewed the ECG and felt this is likely early repolization and doubt MI.  Recommend no additional treatment.    1:46 PM I have consult with neurologist, Dr. Cyril [name] who recommend patient to resume his medication and have some close follow-up with his neurologist. At this time, labs are reassuring, normal electrolytes, no leukocytosis, UA without signs of infection, mild ketones. Troponin is negative.  2:49 PM Prior to discharge, patient complaining of heart palpitation. He has no significant electrolytes abnormalities, and EKG shows sinus rhythm. Patient also request to have further testing to check for B6 and other neurological chemical markers such as dopamine. Although he may benefit from these testing, this can be done by his neurologist as indicated. I explained this to patient and also request Dr. Rhunette [name] to evaluate patient as well.   Labs Review Labs Reviewed  CBC WITH DIFFERENTIAL/PLATELET - Abnormal; Notable for the following:    Neutrophils Relative % 81 (*)    All other components within normal limits  COMPREHENSIVE METABOLIC PANEL - Abnormal; Notable for the following:    Glucose, Bld 105 (*)    Total Protein 6.3 (*)    All other components within normal limits  URINALYSIS, ROUTINE W REFLEX  MICROSCOPIC (NOT AT [facility]) - Abnormal; Notable for the following:    Ketones, ur 15 (*)    All other components within normal limits  I-STAT TROPOININ, ED    Imaging Review No results found.   EKG Interpretation   Date/Time:  Wednesday June 10 2015 11:42:45 EDT Ventricular Rate:  66 PR Interval:    QRS Duration: 63 QT Interval:  366 QTC Calculation: 383 R Axis:   84 Text Interpretation:  Normal sinus rhythm ST elevation, consider  anterolateral injury No reciprocal depression No old tracing to compare  Confirmed by NANAVATI, MD, Janey Genta [phone]) on 06/10/2015 11:48:37 AM      MDM   Final diagnoses:  Near syncope  Essential tremor    BP 105/62 mmHg  Pulse 64  Temp(Src) 97.7 F (36.5 C) (Oral)  Resp 24  SpO2 98%  I have reviewed nursing notes and vital signs. I reviewed available ER/hospitalization records through the EMR     Fayrene Helper, PA-C 06/10/15 1519  Derwood Kaplan, MD 06/11/15 0710

## 2015-06-10 NOTE — ED Notes (Signed)
Pt reports taking parkinson meds at 1345 and c/o now having "fluttering heart and not feeling right" - EDP aware, repeat EKG ordered and completed.

## 2015-06-10 NOTE — Telephone Encounter (Signed)
Spoke with patients wife she states they are in route to the hospital patient just that out of source. She will increase the rytary but just felt he needed to be seen today. She will keep [place] posted and possibly schedule a follow up for next week after they leave the hospital

## 2015-06-10 NOTE — Telephone Encounter (Signed)
Spoke with patient he just wanted to let Dr. Arbutus Leas know he wasn't taking his rytary and that he was doing a natural med. He will be restarting all his RX medications

## 2015-06-10 NOTE — Telephone Encounter (Signed)
Patient wife states that the patient is feeling really off today with server tremors patient has had a recent change in medication (D5 Mucuna) she would like to see if he could be seen today? Please advise

## 2015-06-10 NOTE — Telephone Encounter (Signed)
Tell him I am really full today and am in surgery tomorrow.  What is this recent change of med (I don't understand what D5 mucuna is).   Last time he was here I thought that he needed more levodopa (rytary).  I think that he is currently taking 2 po tid.  Have him try 3 po tid and see how that does.  I can see him next week if that doesn't work

## 2015-06-10 NOTE — ED Notes (Signed)
MD at bedside. 

## 2015-06-12 ENCOUNTER — Encounter: Payer: Self-pay | Admitting: Neurology

## 2015-06-12 ENCOUNTER — Ambulatory Visit (INDEPENDENT_AMBULATORY_CARE_PROVIDER_SITE_OTHER): Admitting: Neurology

## 2015-06-12 VITALS — BP 100/64 | HR 81 | Wt 167.5 lb

## 2015-06-12 DIAGNOSIS — G20 Parkinson's disease: Secondary | ICD-10-CM | POA: Diagnosis not present

## 2015-06-12 DIAGNOSIS — G4752 REM sleep behavior disorder: Secondary | ICD-10-CM

## 2015-06-12 NOTE — Progress Notes (Signed)
Ian PurserKenneth Wilk was seen today in the movement disorders clinic for neurologic consultation at the request of his PCP, [name].  He has previously seen  [name],[name], [name] in [place].   This patient is accompanied in the office by his spouse who supplements the history.The consultation is for the evaluation of PD.  I did review [name]'s notes.  [name]'s notes are not available to me.    Hist first sx was tremor was R arm tremor.  It was in 2011 according to the patient today, but [name]'s notes indicate that perhaps this was in 2009.  He was dx with enhanced physiologic tremor.  He was started on propranolol with some help, but made him sluggish.  He went to see [name] in 2013 for an opinion in [name].  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  He was placed on baclofen and requip.  He thinks that the requip has helped; he is able to turn over in the bed better and move better.  The pt first saw [name] on 09/10/13.  [name] agreed with the Requip and start the patient on [name], but the patient is currently only on its once per day.  12/04/13 update:  The pt presents to the clinic today for his PD.  He is exercising faithfully.  He attends the PD exercise class through the rehab center. The pt was changed to the IR formulation of carbidopa/levodopa 25/100 last visit and is on it tid.  He is not sure it helps.  Afternoons are better than mornings.  carbidopa/levodopa 25/100 is taken at 8am/3:30 pm and bedtime. He is also on requip xl 6 mg daily.  Continues to have significant tremor.  01/27/14 update:  The patient presents today with his wife, who supplements the history.  The pt has a hx of PD and last visit, we decided to increase his carbidopa/levodopa to 2 po tid.  the patient states that he just felt lightheaded on this medication dosage  and up going back to [name] 3 times per day and then takes [name], half tablet twice per day. He remains on requip xl 6 mg daily.  Overall, he does feel fairly good.  He still has tremor.  He went to see [name] since our last visit and again talked about DBS therapy.  [name] mentioned to him that he looks much better and he had previously and had [name] score had declined.  Some yelling out in the sleep but this has not been a huge issue.  No sleepwalking/talking.  No falls.  He is exercising faithfully.  05/28/14 update:  The patient is presenting today for followup.  Last visit, I increased his [name] from 6 mg daily to 4 mg twice a day.  He is doing well with this.  Tremor is about the same.  He is on a strange combination of carbidopa levodopa, but when I tried to switch him off the IR version, the patient just felt lightheaded and ended up going back to a combination of IR and CR.  He is currently on 25/100 CR 3 times per day and then takes [name], half tablet twice per day and sometimes three times per day.  He does best in the evening and worst in the AM.   No swallowing troubles.  Has  dropped weight but thinks that it is exercise and was trying to follow gluten free diet. He is walking, doing a spinning class for PD, and has started golfing again, which he hasn't done in years.   Still considering DBS therapy.    08/27/14 update:  Patient presents today for followup.  He is currently on [name], 4mg  twice a day.  He is on a strange combination of carbidopa levodopa, but hasn't been able to tolerate the IR version well.[name].  He is currently on 25/100 CR 2-3 times per day and then 50/200 at night.  He rarely takes the IR now; only if playing golf or if he needs a boost during the day.  He is finding that he is having more tremor.  He is also having dreams at night that are making his sleep feel unrestful.  He is strongly  considering deep brain stimulation.  He asks me about details regarding this today.  He has asks me about potentially getting a refill on his Ativan.  He apparently got this filled from an outside physician about a year and a half ago and only had 10 pills in total and is just now running out.  He takes one pill every 3 or 4 months if he has to go to a function with his wife.  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  He asks me if he can have a refill today.  09/10/14 update:  The patient is seen today unexpectedly.  Last visit, I started [name] 145 mg, 2 tablets 3 times per day.  He is also on [name] 4 mg twice a day.  He called me initially to tell me that the [name] was working great and he felt much better on the medication.  In fact, he states that his PT and OT reported that he looked much better and his eval scores were markedly improved and they asked him what he had done differently.  He states last week was just a great week.  On Saturday, he was just a little lightheaded and his wife checked his orthostatics and he states that they were negative.  This week, however, he began to report some abnormal movements.  The movements were primarily of the left shoulder and in the abdomen. I wanted to see him on this medication before added or changed anything, as I have never seen him with dyskinesia.  Unfortunately, this morning he only took one of his tablets, but he did take 2 tablets at lunch time about 1 hour before coming.  He has not noticed the movements today.  He has noticed much less tremor overall since starting [name].  11/25/14 update:  The patient returns today for follow-up.  He is on [name] 4 mg twice a day and [name] 145 mg, 2 tablets 3 times per day.  His biggest issue is tremor, but states that its under good control today (but he just exercised).  He has developed tendinitis in the elbow.  He has clonazepam for REM behavior  disorder but admits he doesn't use it.  The patient remains very active in our Parkinson's exercise classes.  No falls.  Some days he does have to "fight" the posture and the "shuffling."  He didn't ever get to see Dr. Venetia MaxonStern as he states that his life got hectic moving his mother in law.    02/20/15 update:  The patient returns today, accompanied by his wife  who supplements the history.  Last visit, I increased his [name] to 195 mg, and he is taking 2 tablets 3 times a day.  He is also on [name] 4 mg twice a day.  He reports that he has not been doing as well because stress levels have been increased.  His mother-in-law, who is living with them, died unexpectedly.  Because of that, he has had more trouble sleeping as well.  He is still faithfully attending exercise classes and is involved in the Parkinson's community.  He has noticed more rigidity and tremor.  He is taking melatonin, 5 Mill grams at night to help him sleep but does not want to use clonazepam, even though he has at home.  05/24/25 update:  The patient returns today for follow-up.  His wife accompanies him and supplements the history.  He is on [name] 4 mg twice a day and [name] 195 mg, 2 tablets 3 times per day.  He has not wanted to increase the dose, although he likely could use more levodopa. States that he is more tremulous right now and attributes that to the fact that he has had a GI illness recently, which made sx's worse.  He has had emesis (one episode).  No diarrhea.  No fevers.    He has not had any falls.  He denies any hallucinations.  No confusion.  No lightheadedness or near syncope.  He continues to exercise faithfully.  06/12/15 update:  The patient is following up today, accompanied by his wife who supplements the history.  When I saw him last on 05/22/15, he was obviously underdosed and I expressed the need for more levodopa but the pt didn't want to increase it as he felt that his viral gastroenteritis was just causing his  worsening sx's.  He didn't tell me then that he had been off of his meds for 3 days and that is the reason he didn't look well.  They called me on 06/10/2015 however to state that he was doing much worse.  They wanted me to work him in that day, but I was not able to.  I didn't realize that he had been off all his PD meds since June 1 and had started receiving care from a natureopath in [place], Dr. Beckie BusingUrban.  He was given information on Dr. Beckie BusingUrban by his dentist Dr. Mellody DanceKeith, who told him he should try amino acid therapy.  He researched and started seeing Dr. Beckie BusingUrban and was told he needed to be off all his PD meds.  He started on D5-mucuna which markets itself as a "safe, natural" levodopa therapy.  He was told that he needed 4-6 months to see a response, but admits that he was told to tell his doctor what he was doing.  He was increasing the dose of this supplement based on urine dopamine levels which they were told were low.  They apparently were also measuring urine serotonin levels.  Not surprisingly, the patient decompensated.  He states that initially he didn't "feel that bad" but the "tremor was awful."  He also states that he had terrible vivid dreams and ultimately became exhausted from not sleeping, shaking and increased rigidity. It got so bad that he ended up in the ER 2 days ago.    He is back on rytary 195, 2 po tid.  He is a little quesy since being back on it.  He has only been on it for 2 days.  He states that he  talked with the natureopath and she told him to just "start back on it."  He is off of the D5-mucuna.  He and his wife bring in several articles for me to read today.    Neuroimaging has previously been performed.  It is not available for my review today.  I did ask the patient to get me.  PREVIOUS MEDICATIONS: Requip and neupro (tried few days only); propranolol helped tremor some  ALLERGIES:  No Known Allergies  CURRENT MEDICATIONS:     Medication List       This list is accurate  as of: 06/12/15  8:09 AM.  Always use your most recent med list.               COQ10 PO  Take 100 mg by mouth daily.     D-5000 5000 UNITS Tabs  Generic drug:  Cholecalciferol  Take 1 tablet by mouth daily.     LECITHIN PO  Take by mouth daily. 3 tblsp     MAGNESIUM PO  Take 800 mg by mouth daily.     MULTI-VITAMINS Tabs  Take 1 tablet by mouth daily.     niacin 1000 MG CR tablet  Commonly known as:  NIASPAN  Take 1,000 mg by mouth daily.     rOPINIRole 4 MG 24 hr tablet  Commonly known as:  [name]  TAKE ONE TABLET BY MOUTH TWICE DAILY     [name] 48.75-195 MG Cpcr  Generic drug:  Carbidopa-Levodopa ER  TAKE TWO CAPSULES BY MOUTH THREE TIMES DAILY     VITAMIN C PO  Take 5,000 mg by mouth daily.     vitamin E 400 UNIT capsule  Take 400 Units by mouth daily.         PAST MEDICAL HISTORY:   Past Medical History  Diagnosis Date  . PD (Parkinson's disease) 09/10/2013    PAST SURGICAL HISTORY:   Past Surgical History  Procedure Laterality Date  . None      SOCIAL HISTORY:   History   Social History  . Marital Status: Married    Spouse Name: N/A  . Number of Children: N/A  . Years of Education: N/A   Occupational History  . Not on file.   Social History Main Topics  . Smoking status: Never Smoker   . Smokeless tobacco: Never Used  . Alcohol Use: Yes     Comment: Occ  . Drug Use: Not on file  . Sexual Activity: Not on file   Other Topics Concern  . Not on file   Social History Narrative    FAMILY HISTORY:   Family Status  Relation Status Death Age  . Mother Deceased 2    Breast Cancer  . Father Alive     Diabetes  . Sister Alive     healthy  . Daughter Alive     2, twins  . Son Alive     ROS: A complete 10 system review of systems was obtained and was unremarkable apart from what is mentioned above.  PHYSICAL EXAMINATION:    VITALS:   Filed Vitals:   06/12/15 0805  BP: 100/64  Pulse: 81  Weight: 167 lb 8 oz (75.978 kg)   SpO2: 97%   Wt Readings from Last 3 Encounters:  06/12/15 167 lb 8 oz (75.978 kg)  05/25/15 165 lb (74.844 kg)  02/20/15 169 lb (76.658 kg)    GEN:  The patient appears stated age and is in NAD. HEENT:  Normocephalic, atraumatic.  The mucous membranes are moist. The superficial temporal arteries are without ropiness or tenderness. CV:  RRR Lungs:  CTAB Neck/HEME:  There are no carotid bruits bilaterally.  Neurological examination:  Orientation: The patient is alert and oriented x3. Fund of knowledge is appropriate.  Recent and remote memory are intact.  Attention and concentration are normal.    Able to name objects and repeat phrases. Cranial nerves: There is good facial symmetry.  There is facial hypomimia.  Pupils are equal round and reactive to light bilaterally. The speech is fluent and clear. Soft palate rises symmetrically and there is no tongue deviation. Hearing is intact to conversational tone. Sensation: Sensation is intact to light touch throughout. Motor: Strength is 5/5 in the bilateral upper and lower extremities.   Shoulder shrug is equal and symmetric.  There is no pronator drift.   Movement examination: Tone: There is mild to moderate rigidity in the bilateral upper and lower extremities Abnormal movements: There is very rare tremor today. Coordination:  There is  decremation with RAM's, seen primarily with toe taps/heel taps today bilaterally.   Gait and Station: The patient has no difficulty arising out of a deep-seated chair without the use of the hands. The patient's stride length is fairly good There is improved arm swing bilaterally.   He is able to run down the hall well.   The patient has a negative pull test.      ASSESSMENT/PLAN:  1. idiopathic Parkinson's disease.  The patient has tremor, bradykinesia, rigidity and mild postural instability.  -We discussed the diagnosis as well as pathophysiology of the disease.  We discussed treatment options as well as  prognostic indicators.  Patient education was provided.  -Long discussion with the patient and his wife regarding his medications.  I understand that they want "natural" therapy, but he also had significant decompensation when he went off of his medication.  I explained to him that with the natureopath and dentist were telling him was incorrect and not based in good scientific evidence.  There is no way to measure dopamine or serotonin in urine.  I explained to him that in no way should Parkinson's therapy ever be based on this type of "measurement."  I read the articles that they provided to me and one was a single case study and the other one was not a scientific article, but rather a lay article about called "what if there was a cure for Parkinson's disease and no one heard about it?"  Talked to them about fact that I am happy to do integrative care, but it must make good scientific sense and it must not be harmful to his overall care.  -Remain on [name]  4 mg twice a day.    -I still think he would benefit from an increase in the [name]. I told him that I would at least try to take rytary 195 mg 3 po tid (or he has some 145 mg left at home and he can add that tid to the rytary 195 mg - 2 po tid he is taking).   He will let me know if he needs a new RX for the increase.  -DBS was discussed.   -Much greater than 50% of this visit was spent in counseling with the patient and the family.  Total face to face time:  45 min 2.  REM behavior disorder.  -He has clonazepam as needed but he does not wish to use that.  Discussed melatonin but states that doing better now that back on rytary 3.  follow up in 3-4 months

## 2015-06-26 ENCOUNTER — Telehealth: Payer: Self-pay | Admitting: Neurology

## 2015-06-26 ENCOUNTER — Encounter: Payer: Self-pay | Admitting: Neurology

## 2015-06-26 NOTE — Telephone Encounter (Signed)
Patient is aware and will check his My Chart mail

## 2015-06-26 NOTE — Telephone Encounter (Signed)
Please call pt and let him know that I had sent him email via mychart the day he left and it came back that he hadn't read them so just wanted to make sure that he knew that I had read and responded to the articles he asked me to read.

## 2015-07-07 ENCOUNTER — Telehealth: Payer: Self-pay | Admitting: Neurology

## 2015-07-07 NOTE — Telephone Encounter (Signed)
Office notes from 12/19/2014-present faxed to [facility] per their request at [phone] with confirmation received.

## 2015-07-21 ENCOUNTER — Other Ambulatory Visit: Payer: Self-pay | Admitting: Neurology

## 2015-07-21 NOTE — Telephone Encounter (Signed)
Requip refill requested. Per last office note- patient to remain on medication. Refill approved and sent to patient's pharmacy.   

## 2015-08-17 ENCOUNTER — Encounter: Payer: Self-pay | Admitting: Neurology

## 2015-09-16 ENCOUNTER — Encounter: Payer: Self-pay | Admitting: Neurology

## 2015-09-24 DIAGNOSIS — Z0279 Encounter for issue of other medical certificate: Secondary | ICD-10-CM

## 2015-09-25 ENCOUNTER — Ambulatory Visit: Admitting: Neurology

## 2015-09-29 ENCOUNTER — Ambulatory Visit (INDEPENDENT_AMBULATORY_CARE_PROVIDER_SITE_OTHER): Admitting: Neurology

## 2015-09-29 ENCOUNTER — Encounter: Payer: Self-pay | Admitting: Neurology

## 2015-09-29 ENCOUNTER — Telehealth: Payer: Self-pay | Admitting: Neurology

## 2015-09-29 VITALS — BP 120/62 | HR 72 | Ht 74.0 in | Wt 170.0 lb

## 2015-09-29 DIAGNOSIS — G20 Parkinson's disease: Secondary | ICD-10-CM | POA: Diagnosis not present

## 2015-09-29 MED ORDER — ROPINIROLE HCL ER 6 MG PO TB24: 1.0000 | ORAL_TABLET | Freq: Two times a day (BID) | ORAL | Status: DC

## 2015-09-29 NOTE — Progress Notes (Signed)
Ian PurserKenneth Wilk was seen today in the movement disorders clinic for neurologic consultation at the request of his PCP, [name].  He has previously seen  [name],[name], [name] in [place].   This patient is accompanied in the office by his spouse who supplements the history.The consultation is for the evaluation of PD.  I did review [name]'s notes.  [name]'s notes are not available to me.    Hist first sx was tremor was R arm tremor.  It was in 2011 according to the patient today, but [name]'s notes indicate that perhaps this was in 2009.  He was dx with enhanced physiologic tremor.  He was started on propranolol with some help, but made him sluggish.  He went to see [name] in 2013 for an opinion in [name].  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  He was placed on baclofen and requip.  He thinks that the requip has helped; he is able to turn over in the bed better and move better.  The pt first saw [name] on 09/10/13.  [name] agreed with the Requip and start the patient on [name], but the patient is currently only on its once per day.  12/04/13 update:  The pt presents to the clinic today for his PD.  He is exercising faithfully.  He attends the PD exercise class through the rehab center. The pt was changed to the IR formulation of carbidopa/levodopa 25/100 last visit and is on it tid.  He is not sure it helps.  Afternoons are better than mornings.  carbidopa/levodopa 25/100 is taken at 8am/3:30 pm and bedtime. He is also on requip xl 6 mg daily.  Continues to have significant tremor.  01/27/14 update:  The patient presents today with his wife, who supplements the history.  The pt has a hx of PD and last visit, we decided to increase his carbidopa/levodopa to 2 po tid.  the patient states that he just felt lightheaded on this medication dosage  and up going back to [name] 3 times per day and then takes [name], half tablet twice per day. He remains on requip xl 6 mg daily.  Overall, he does feel fairly good.  He still has tremor.  He went to see [name] since our last visit and again talked about DBS therapy.  [name] mentioned to him that he looks much better and he had previously and had [name] score had declined.  Some yelling out in the sleep but this has not been a huge issue.  No sleepwalking/talking.  No falls.  He is exercising faithfully.  05/28/14 update:  The patient is presenting today for followup.  Last visit, I increased his [name] from 6 mg daily to 4 mg twice a day.  He is doing well with this.  Tremor is about the same.  He is on a strange combination of carbidopa levodopa, but when I tried to switch him off the IR version, the patient just felt lightheaded and ended up going back to a combination of IR and CR.  He is currently on 25/100 CR 3 times per day and then takes [name], half tablet twice per day and sometimes three times per day.  He does best in the evening and worst in the AM.   No swallowing troubles.  Has  dropped weight but thinks that it is exercise and was trying to follow gluten free diet. He is walking, doing a spinning class for PD, and has started golfing again, which he hasn't done in years.   Still considering DBS therapy.    08/27/14 update:  Patient presents today for followup.  He is currently on [name], 4mg  twice a day.  He is on a strange combination of carbidopa levodopa, but hasn't been able to tolerate the IR version well.[name].  He is currently on 25/100 CR 2-3 times per day and then 50/200 at night.  He rarely takes the IR now; only if playing golf or if he needs a boost during the day.  He is finding that he is having more tremor.  He is also having dreams at night that are making his sleep feel unrestful.  He is strongly  considering deep brain stimulation.  He asks me about details regarding this today.  He has asks me about potentially getting a refill on his Ativan.  He apparently got this filled from an outside physician about a year and a half ago and only had 10 pills in total and is just now running out.  He takes one pill every 3 or 4 months if he has to go to a function with his wife.  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  He asks me if he can have a refill today.  09/10/14 update:  The patient is seen today unexpectedly.  Last visit, I started [name] 145 mg, 2 tablets 3 times per day.  He is also on [name] 4 mg twice a day.  He called me initially to tell me that the [name] was working great and he felt much better on the medication.  In fact, he states that his PT and OT reported that he looked much better and his eval scores were markedly improved and they asked him what he had done differently.  He states last week was just a great week.  On Saturday, he was just a little lightheaded and his wife checked his orthostatics and he states that they were negative.  This week, however, he began to report some abnormal movements.  The movements were primarily of the left shoulder and in the abdomen. I wanted to see him on this medication before added or changed anything, as I have never seen him with dyskinesia.  Unfortunately, this morning he only took one of his tablets, but he did take 2 tablets at lunch time about 1 hour before coming.  He has not noticed the movements today.  He has noticed much less tremor overall since starting [name].  11/25/14 update:  The patient returns today for follow-up.  He is on [name] 4 mg twice a day and [name] 145 mg, 2 tablets 3 times per day.  His biggest issue is tremor, but states that its under good control today (but he just exercised).  He has developed tendinitis in the elbow.  He has clonazepam for REM behavior  disorder but admits he doesn't use it.  The patient remains very active in our Parkinson's exercise classes.  No falls.  Some days he does have to "fight" the posture and the "shuffling."  He didn't ever get to see Dr. Venetia MaxonStern as he states that his life got hectic moving his mother in law.    02/20/15 update:  The patient returns today, accompanied by his wife  who supplements the history.  Last visit, I increased his [name] to 195 mg, and he is taking 2 tablets 3 times a day.  He is also on [name] 4 mg twice a day.  He reports that he has not been doing as well because stress levels have been increased.  His mother-in-law, who is living with them, died unexpectedly.  Because of that, he has had more trouble sleeping as well.  He is still faithfully attending exercise classes and is involved in the Parkinson's community.  He has noticed more rigidity and tremor.  He is taking melatonin, 5 Mill grams at night to help him sleep but does not want to use clonazepam, even though he has at home.  05/24/25 update:  The patient returns today for follow-up.  His wife accompanies him and supplements the history.  He is on [name] 4 mg twice a day and [name] 195 mg, 2 tablets 3 times per day.  He has not wanted to increase the dose, although he likely could use more levodopa. States that he is more tremulous right now and attributes that to the fact that he has had a GI illness recently, which made sx's worse.  He has had emesis (one episode).  No diarrhea.  No fevers.    He has not had any falls.  He denies any hallucinations.  No confusion.  No lightheadedness or near syncope.  He continues to exercise faithfully.  06/12/15 update:  The patient is following up today, accompanied by his wife who supplements the history.  When I saw him last on 05/22/15, he was obviously underdosed and I expressed the need for more levodopa but the pt didn't want to increase it as he felt that his viral gastroenteritis was just causing his  worsening sx's.  He didn't tell me then that he had been off of his meds for 3 days and that is the reason he didn't look well.  They called me on 06/10/2015 however to state that he was doing much worse.  They wanted me to work him in that day, but I was not able to.  I didn't realize that he had been off all his PD meds since June 1 and had started receiving care from a natureopath in [place], Dr. Beckie BusingUrban.  He was given information on Dr. Beckie BusingUrban by his dentist Dr. Mellody DanceKeith, who told him he should try amino acid therapy.  He researched and started seeing Dr. Beckie BusingUrban and was told he needed to be off all his PD meds.  He started on D5-mucuna which markets itself as a "safe, natural" levodopa therapy.  He was told that he needed 4-6 months to see a response, but admits that he was told to tell his doctor what he was doing.  He was increasing the dose of this supplement based on urine dopamine levels which they were told were low.  They apparently were also measuring urine serotonin levels.  Not surprisingly, the patient decompensated.  He states that initially he didn't "feel that bad" but the "tremor was awful."  He also states that he had terrible vivid dreams and ultimately became exhausted from not sleeping, shaking and increased rigidity. It got so bad that he ended up in the ER 2 days ago.    He is back on rytary 195, 2 po tid.  He is a little quesy since being back on it.  He has only been on it for 2 days.  He states that he  talked with the natureopath and she told him to just "start back on it."  He is off of the D5-mucuna.  He and his wife bring in several articles for me to read today.    09/29/15 update:  The patient is following up today, accompanied by his wife who supplements the history.  He is on [name] 4 mg twice a day.  He reports that he is now taking  [name] 195 mg, 2 tablets 3 times a day along with [name] 145 mg, one tablet 3 times a day.  He spreads it out so that he takes it at 8 AM/3 PM/11 PM.   He does not necessarily halfway he describes as "traditional wearing off" but states that at the end of the dose he just does not feel good.  Overall, the patient states that he has been doing okay but he also feels that things are more unpredictable.  He feels more tired and thinks that he has mild dyskinesia.  When he eats, he is biting his lip in the same place.   He denies any lightheadedness or near syncope.  He denies falls.  When asked about depression, he states that he thinks "depression is a strong word" but admits that he gets frustrated and overwhelmed about the disease but uses exercise as a release.  He has sent me paperwork since last visit for his disability which has been completed.  He asks me about writing another letter regarding his VA disability as well.  Neuroimaging has previously been performed.  It is not available for my review today.  I did ask the patient to get me.  PREVIOUS MEDICATIONS: Requip and neupro (tried few days only); propranolol helped tremor some  ALLERGIES:  No Known Allergies  CURRENT MEDICATIONS:     Medication List       This list is accurate as of: 09/29/15  9:53 AM.  Always use your most recent med list.               COQ10 PO  Take 100 mg by mouth daily.     D-5000 5000 UNITS Tabs  Generic drug:  Cholecalciferol  Take 1 tablet by mouth daily.     LECITHIN PO  Take by mouth daily. 3 tblsp     MAGNESIUM PO  Take 800 mg by mouth daily.     MULTI-VITAMINS Tabs  Take 1 tablet by mouth daily.     niacin 1000 MG CR tablet  Commonly known as:  NIASPAN  Take 1,000 mg by mouth daily.     rOPINIRole 4 MG 24 hr tablet  Commonly known as:  [name]  TAKE ONE TABLET BY MOUTH TWICE DAILY     [name] 48.75-195 MG Cpcr  Generic drug:  Carbidopa-Levodopa ER  TAKE TWO CAPSULES BY MOUTH THREE TIMES DAILY     [name] 36.25-145 MG Cpcr  Generic drug:  Carbidopa-Levodopa ER  Take 1 tablet by mouth 3 (three) times daily.     VITAMIN C PO   Take 5,000 mg by mouth daily.     vitamin E 400 UNIT capsule  Take 400 Units by mouth daily.         PAST MEDICAL HISTORY:   Past Medical History  Diagnosis Date  . PD (Parkinson's disease) (HCC) 09/10/2013    PAST SURGICAL HISTORY:   Past Surgical History  Procedure Laterality Date  . None      SOCIAL HISTORY:   Social History   Social  History  . Marital Status: Married    Spouse Name: N/A  . Number of Children: N/A  . Years of Education: N/A   Occupational History  . Not on file.   Social History Main Topics  . Smoking status: Never Smoker   . Smokeless tobacco: Never Used  . Alcohol Use: Yes     Comment: Occ  . Drug Use: Not on file  . Sexual Activity: Not on file   Other Topics Concern  . Not on file   Social History Narrative    FAMILY HISTORY:   Family Status  Relation Status Death Age  . Mother Deceased 97    Breast Cancer  . Father Alive     Diabetes  . Sister Alive     healthy  . Daughter Alive     2, twins  . Son Alive     ROS: A complete 10 system review of systems was obtained and was unremarkable apart from what is mentioned above.  PHYSICAL EXAMINATION:    VITALS:   Filed Vitals:   09/29/15 0949  BP: 120/62  Pulse: 72  Height: 6\' 2"  (1.88 m)  Weight: 170 lb (77.111 kg)   Wt Readings from Last 3 Encounters:  09/29/15 170 lb (77.111 kg)  06/12/15 167 lb 8 oz (75.978 kg)  05/25/15 165 lb (74.844 kg)    GEN:  The patient appears stated age and is in NAD. HEENT:  Normocephalic, atraumatic.  The mucous membranes are moist. The superficial temporal arteries are without ropiness or tenderness. CV:  RRR Lungs:  CTAB Neck/HEME:  There are no carotid bruits bilaterally.  Neurological examination:  Orientation: The patient is alert and oriented x3. Fund of knowledge is appropriate.  Recent and remote memory are intact.  Attention and concentration are normal.    Able to name objects and repeat phrases. Cranial nerves: There  is good facial symmetry.  There is facial hypomimia.  Pupils are equal round and reactive to light bilaterally. The speech is fluent and clear. Soft palate rises symmetrically and there is no tongue deviation. Hearing is intact to conversational tone. Sensation: Sensation is intact to light touch throughout. Motor: Strength is 5/5 in the bilateral upper and lower extremities.   Shoulder shrug is equal and symmetric.  There is no pronator drift.   Movement examination: Tone: There is moderate rigidity in the upper extremities. Abnormal movements: There is no tremor today Coordination:  There is  decremation with RAM's, seen primarily with toe taps/heel taps today bilaterally.   Gait and Station: The patient has no difficulty arising out of a deep-seated chair without the use of the hands. The patient's stride length is fairly good There is slightly decreased arm swing.   He is able to run down the hall well.   The patient has a negative pull test.      ASSESSMENT/PLAN:  1. idiopathic Parkinson's disease.  The patient has tremor, bradykinesia, rigidity and mild postural instability.  -Increase [name], 6 mg twice a day  -Move [name] closer together so that he is taking it at 8 AM/1 PM/6 PM and slightly increase it so that he is taking 195 mg, 3 tablets 3 times per day.  -DBS was discussed again today.  States that he has researched it more and thinks that he wants to pursue that within the next year or so, but he will wait and see.  -We will write another letter to the [employer] regarding his disability. 2.  Possible depression   -I think the patient does have some depression, which is very common with Parkinson's disease.  He and I discussed this today.  I know he does not want to take medication, which expressed today.  We talked about other avenues.  He has already exercising faithfully.  I told him that cognitive behavioral therapy and counseling could be of value as well.  He stated that he would  think about that and thought he would be open to those modalities. 3.  REM behavior disorder.  -He has clonazepam as needed but he does not wish to use that.  Discussed melatonin but states that doing better now that back on rytary 4.  follow up in 3-4 months.  Much greater than 50% of this visit was spent in counseling with the patient and the family.  Total face to face time:  25 min

## 2015-09-29 NOTE — Patient Instructions (Signed)
1. Change Requip to 6 mg XL twice daily. 2. Change [name] to 195 mg 3 tablets three times daily.

## 2015-09-29 NOTE — Telephone Encounter (Signed)
[facility] making patient aware letter written by Dr [name] and at the front for pick up.

## 2015-10-06 ENCOUNTER — Encounter: Payer: Self-pay | Admitting: Neurology

## 2015-10-06 MED ORDER — CARBIDOPA-LEVODOPA ER 48.75-195 MG PO CPCR: 3.0000 | ORAL_CAPSULE | Freq: Three times a day (TID) | ORAL | Status: DC

## 2015-10-19 ENCOUNTER — Telehealth: Payer: Self-pay | Admitting: Neurology

## 2015-10-19 NOTE — Telephone Encounter (Signed)
Notes faxed to [facility] at [phone] as requested with confirmation received.

## 2015-11-24 ENCOUNTER — Encounter: Payer: Self-pay | Admitting: Neurology

## 2015-12-01 ENCOUNTER — Encounter: Payer: Self-pay | Admitting: Neurology

## 2015-12-30 ENCOUNTER — Encounter: Payer: Self-pay | Admitting: Neurology

## 2015-12-30 ENCOUNTER — Ambulatory Visit (INDEPENDENT_AMBULATORY_CARE_PROVIDER_SITE_OTHER): Admitting: Neurology

## 2015-12-30 VITALS — BP 124/60 | HR 97 | Ht 74.0 in | Wt 174.0 lb

## 2015-12-30 DIAGNOSIS — G20 Parkinson's disease: Secondary | ICD-10-CM

## 2015-12-30 DIAGNOSIS — G4752 REM sleep behavior disorder: Secondary | ICD-10-CM

## 2015-12-30 NOTE — Progress Notes (Signed)
Ian [name] was seen today in the movement disorders clinic for neurologic consultation at the request of his PCP, Dr. Drue [name].  He has previously seen  Ian [name],Ian L, MD and saw Dr. Edwin [name] in [place].   This patient is accompanied in the office by his spouse who supplements the history.The consultation is for the evaluation of PD.  I did review Dr. Carloyn [name]'s notes.  Dr. Hurley [name]'s notes are not available to me.    Hist first sx was tremor was R arm tremor.  It was in 2011 according to the patient today, but Dr. Faye [name]'s notes indicate that perhaps this was in 2009.  He was dx with enhanced physiologic tremor.  He was started on propranolol with some help, but made him sluggish.  He went to see Dr. Edwin [name] in 2013 for an opinion in [name].  Dr. 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  He was placed on baclofen and requip.  He thinks that the requip has helped; he is able to turn over in the bed better and move better.  The pt first saw Dr. Rubin [name] on 09/10/13.  Dr. Rubin [name] agreed with the Requip and start the patient on [name], but the patient is currently only on its once per day.  12/04/13 update:  The pt presents to the clinic today for his PD.  He is exercising faithfully.  He attends the PD exercise class through the rehab center. The pt was changed to the IR formulation of carbidopa/levodopa 25/100 last visit and is on it tid.  He is not sure it helps.  Afternoons are better than mornings.  carbidopa/levodopa 25/100 is taken at 8am/3:30 pm and bedtime. He is also on requip xl 6 mg daily.  Continues to have significant tremor.  01/27/14 update:  The patient presents today with his wife, who supplements the history.  The pt has a hx of PD and last visit, we decided to increase his carbidopa/levodopa to 2 po tid.  the patient states that he just felt lightheaded on this medication dosage  and up going back to [name] 3 times per day and then takes [name], half tablet twice per day. He remains on requip xl 6 mg daily.  Overall, he does feel fairly good.  He still has tremor.  He went to see Dr. Rubin [name] since our last visit and again talked about DBS therapy.  Dr. Rubin [name] mentioned to him that he looks much better and he had previously and had [name] score had declined.  Some yelling out in the sleep but this has not been a huge issue.  No sleepwalking/talking.  No falls.  He is exercising faithfully.  05/28/14 update:  The patient is presenting today for followup.  Last visit, I increased his [name] from 6 mg daily to 4 mg twice a day.  He is doing well with this.  Tremor is about the same.  He is on a strange combination of carbidopa levodopa, but when I tried to switch him off the IR version, the patient just felt lightheaded and ended up going back to a combination of IR and CR.  He is currently on 25/100 CR 3 times per day and then takes [name], half tablet twice per day and sometimes three times per day.  He does best in the evening and worst in the AM.   No swallowing troubles.  Has  dropped weight but thinks that it is exercise and was trying to follow gluten free diet. He is walking, doing a spinning class for PD, and has started golfing again, which he hasn't done in years.   Still considering DBS therapy.    08/27/14 update:  Patient presents today for followup.  He is currently on [name], 4mg  twice a day.  He is on a strange combination of carbidopa levodopa, but hasn't been able to tolerate the IR version well.[name].  He is currently on 25/100 CR 2-3 times per day and then 50/200 at night.  He rarely takes the IR now; only if playing golf or if he needs a boost during the day.  He is finding that he is having more tremor.  He is also having dreams at night that are making his sleep feel unrestful.  He is strongly  considering deep brain stimulation.  He asks me about details regarding this today.  He has asks me about potentially getting a refill on his Ativan.  He apparently got this filled from an outside physician about a year and a half ago and only had 10 pills in total and is just now running out.  He takes one pill every 3 or 4 months if he has to go to a function with his wife.  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  He asks me if he can have a refill today.  09/10/14 update:  The patient is seen today unexpectedly.  Last visit, I started [name] 145 mg, 2 tablets 3 times per day.  He is also on [name] 4 mg twice a day.  He called me initially to tell me that the [name] was working great and he felt much better on the medication.  In fact, he states that his PT and OT reported that he looked much better and his eval scores were markedly improved and they asked him what he had done differently.  He states last week was just a great week.  On Saturday, he was just a little lightheaded and his wife checked his orthostatics and he states that they were negative.  This week, however, he began to report some abnormal movements.  The movements were primarily of the left shoulder and in the abdomen. I wanted to see him on this medication before added or changed anything, as I have never seen him with dyskinesia.  Unfortunately, this morning he only took one of his tablets, but he did take 2 tablets at lunch time about 1 hour before coming.  He has not noticed the movements today.  He has noticed much less tremor overall since starting [name].  11/25/14 update:  The patient returns today for follow-up.  He is on [name] 4 mg twice a day and [name] 145 mg, 2 tablets 3 times per day.  His biggest issue is tremor, but states that its under good control today (but he just exercised).  He has developed tendinitis in the elbow.  He has clonazepam for REM behavior  disorder but admits he doesn't use it.  The patient remains very active in our Parkinson's exercise classes.  No falls.  Some days he does have to "fight" the posture and the "shuffling."  He didn't ever get to see Dr. Venetia MaxonStern as he states that his life got hectic moving his mother in law.    02/20/15 update:  The patient returns today, accompanied by his wife  who supplements the history.  Last visit, I increased his [name] to 195 mg, and he is taking 2 tablets 3 times a day.  He is also on [name] 4 mg twice a day.  He reports that he has not been doing as well because stress levels have been increased.  His mother-in-law, who is living with them, died unexpectedly.  Because of that, he has had more trouble sleeping as well.  He is still faithfully attending exercise classes and is involved in the Parkinson's community.  He has noticed more rigidity and tremor.  He is taking melatonin, 5 Mill grams at night to help him sleep but does not want to use clonazepam, even though he has at home.  05/24/25 update:  The patient returns today for follow-up.  His wife accompanies him and supplements the history.  He is on [name] 4 mg twice a day and [name] 195 mg, 2 tablets 3 times per day.  He has not wanted to increase the dose, although he likely could use more levodopa. States that he is more tremulous right now and attributes that to the fact that he has had a GI illness recently, which made sx's worse.  He has had emesis (one episode).  No diarrhea.  No fevers.    He has not had any falls.  He denies any hallucinations.  No confusion.  No lightheadedness or near syncope.  He continues to exercise faithfully.  06/12/15 update:  The patient is following up today, accompanied by his wife who supplements the history.  When I saw him last on 05/22/15, he was obviously underdosed and I expressed the need for more levodopa but the pt didn't want to increase it as he felt that his viral gastroenteritis was just causing his  worsening sx's.  He didn't tell me then that he had been off of his meds for 3 days and that is the reason he didn't look well.  They called me on 06/10/2015 however to state that he was doing much worse.  They wanted me to work him in that day, but I was not able to.  I didn't realize that he had been off all his PD meds since June 1 and had started receiving care from a natureopath in [place], Dr. Beckie BusingUrban.  He was given information on Dr. Beckie BusingUrban by his dentist Dr. Mellody DanceKeith, who told him he should try amino acid therapy.  He researched and started seeing Dr. Beckie BusingUrban and was told he needed to be off all his PD meds.  He started on D5-mucuna which markets itself as a "safe, natural" levodopa therapy.  He was told that he needed 4-6 months to see a response, but admits that he was told to tell his doctor what he was doing.  He was increasing the dose of this supplement based on urine dopamine levels which they were told were low.  They apparently were also measuring urine serotonin levels.  Not surprisingly, the patient decompensated.  He states that initially he didn't "feel that bad" but the "tremor was awful."  He also states that he had terrible vivid dreams and ultimately became exhausted from not sleeping, shaking and increased rigidity. It got so bad that he ended up in the ER 2 days ago.    He is back on rytary 195, 2 po tid.  He is a little quesy since being back on it.  He has only been on it for 2 days.  He states that he  talked with the natureopath and she told him to just "start back on it."  He is off of the D5-mucuna.  He and his wife bring in several articles for me to read today.    09/29/15 update:  The patient is following up today, accompanied by his wife who supplements the history.  He is on [name] 4 mg twice a day.  He reports that he is now taking  [name] 195 mg, 2 tablets 3 times a day along with [name] 145 mg, one tablet 3 times a day.  He spreads it out so that he takes it at 8 AM/3 PM/11 PM.   He does not necessarily halfway he describes as "traditional wearing off" but states that at the end of the dose he just does not feel good.  Overall, the patient states that he has been doing okay but he also feels that things are more unpredictable.  He feels more tired and thinks that he has mild dyskinesia.  When he eats, he is biting his lip in the same place.   He denies any lightheadedness or near syncope.  He denies falls.  When asked about depression, he states that he thinks "depression is a strong word" but admits that he gets frustrated and overwhelmed about the disease but uses exercise as a release.  He has sent me paperwork since last visit for his disability which has been completed.  He asks me about writing another letter regarding his VA disability as well.  12/30/15 update:  The patient is following up today.  Last visit, I increased his [name] to 6 mg twice a day.  His [name] has been slightly increased, so that he is on 195 mg, 3 tablets 3 times per day and he added an additional 1 tablet at night because he was awakening at 4 AM with tremor. He has changed that again, but taking the same overall dose of rytary, but is now taking 195, 3 in the AM, 2 in the afternoon, 3 in the evening and then takes 2 at bedtime.   Overall, he thinks he feels that it is a little inconsistent; sometimes he does well and sometimes he doesn't.  He continues to faithfully exercise.  No falls.  No hallucinations.  No syncope but has had some lightheadedness; he has had a uri and isn't sure if it is related to that/meds for that.  Mood has been good.  Sleep has been better since he has been back on his medications.  Appetite has been good.  No new medical issues.  He is still going to integrative therapies and thinks that it is helpful.  However, he did some therapy called alpha stim and was told that it changed the alpha waves in the brain and he didn't think that it was helpful and thinks that it made him feel  strange.  Feels that he has minor dyskinesia, especially of the arm when he walks or bends over.  Asks about DBS therapy and thinks he is going to consider that more this year.    Neuroimaging has previously been performed.  It is not available for my review today.  I did ask the patient to get me.  PREVIOUS MEDICATIONS: Requip and neupro (tried few days only); propranolol helped tremor some  ALLERGIES:  No Known Allergies  CURRENT MEDICATIONS:     Medication List       This list is accurate as of: 12/30/15 10:30 AM.  Always use your  most recent med list.               Carbidopa-Levodopa ER 48.75-195 MG Cpcr  Commonly known as:  [name]  Take 3 capsules by mouth 3 (three) times daily. With an extra tablet at night     COQ10 PO  Take 100 mg by mouth daily.     D-5000 5000 units Tabs  Generic drug:  Cholecalciferol  Take 1 tablet by mouth daily.     LECITHIN PO  Take by mouth daily. 3 tblsp     MAGNESIUM PO  Take 800 mg by mouth daily.     MULTI-VITAMINS Tabs  Take 1 tablet by mouth daily.     niacin 1000 MG CR tablet  Commonly known as:  NIASPAN  Take 1,000 mg by mouth daily.     Ropinirole HCl 6 MG Tb24  Commonly known as:  [name]  Take 1 tablet (6 mg total) by mouth 2 (two) times daily.     VITAMIN C PO  Take 5,000 mg by mouth daily.     vitamin E 400 UNIT capsule  Take 400 Units by mouth daily.         PAST MEDICAL HISTORY:   Past Medical History  Diagnosis Date  . PD (Parkinson's disease) (HCC) 09/10/2013    PAST SURGICAL HISTORY:   Past Surgical History  Procedure Laterality Date  . None      SOCIAL HISTORY:   Social History   Social History  . Marital Status: Married    Spouse Name: N/A  . Number of Children: N/A  . Years of Education: N/A   Occupational History  . Not on file.   Social History Main Topics  . Smoking status: Never Smoker   . Smokeless tobacco: Never Used  . Alcohol Use: Yes     Comment: Occ  . Drug Use: Not  on file  . Sexual Activity: Not on file   Other Topics Concern  . Not on file   Social History Narrative    FAMILY HISTORY:   Family Status  Relation Status Death Age  . Mother Deceased 10    Breast Cancer  . Father Alive     Diabetes  . Sister Alive     healthy  . Daughter Alive     2, twins  . Son Alive     ROS: A complete 10 system review of systems was obtained and was unremarkable apart from what is mentioned above.  PHYSICAL EXAMINATION:    VITALS:   Filed Vitals:   12/30/15 1025  BP: 124/60  Pulse: 97  Height: 6\' 2"  (1.88 m)  Weight: 174 lb (78.926 kg)   Wt Readings from Last 3 Encounters:  12/30/15 174 lb (78.926 kg)  09/29/15 170 lb (77.111 kg)  06/12/15 167 lb 8 oz (75.978 kg)    GEN:  The patient appears stated age and is in NAD. HEENT:  Normocephalic, atraumatic.  The mucous membranes are moist. The superficial temporal arteries are without ropiness or tenderness. CV:  RRR Lungs:  CTAB Neck/HEME:  There are no carotid bruits bilaterally.  Neurological examination:  Orientation: The patient is alert and oriented x3. Fund of knowledge is appropriate.  Recent and remote memory are intact.  Attention and concentration are normal.    Able to name objects and repeat phrases. Cranial nerves: There is good facial symmetry.  There is facial hypomimia.  Pupils are equal round and reactive to light bilaterally. The speech  is fluent and clear. Soft palate rises symmetrically and there is no tongue deviation. Hearing is intact to conversational tone. Sensation: Sensation is intact to light touch throughout. Motor: Strength is 5/5 in the bilateral upper and lower extremities.   Shoulder shrug is equal and symmetric.  There is no pronator drift.   Movement examination: Tone: There is mild-mod rigidity in the RUE.  Tone on the left was normal today Abnormal movements: There is no tremor today Coordination:  There is decremation with RAM's, seen primarily with toe  taps/heel taps today on the right, but it is better.   Gait and Station: The patient has no difficulty arising out of a deep-seated chair without the use of the hands. The patient's stride length is fairly good There is slightly decreased arm swing.   He is able to run down the hall well.   The patient has a negative pull test.      ASSESSMENT/PLAN:  1. idiopathic Parkinson's disease.  The patient has tremor, bradykinesia, rigidity and mild postural instability.  -continue [name], 6 mg twice a day  -continueRytary so that he is taking 3/2/3/2  -DBS was discussed again today at length.  States that he has researched it more and thinks that he wants to pursue that in late spring.  Wife not yet fully convinced but he thinks that he may be ready.  Will let me know when ready to pursue  -talked about mild lightheadedness may represent early dysautonomia but no need for meds right now.  Talked about staying hydrated.  Not ready for compression stocking/abdominal binder 2.  Possible depression   -Managing with exercise and thinks that he is doing fairly well.  3.  REM behavior disorder.  -He has clonazepam as needed but he does not wish to use that.  Discussed melatonin but states that doing better now that back on rytary 4.  follow up in 3-4 months.  Much greater than 50% of this visit was spent in counseling with the patient.  Total face to face time:  45 min

## 2015-12-30 NOTE — Progress Notes (Signed)
Note routed to Dr Mardelle MatteAndy.

## 2016-01-11 ENCOUNTER — Encounter: Payer: Self-pay | Admitting: Neurology

## 2016-01-11 MED ORDER — CARBIDOPA-LEVODOPA ER 48.75-195 MG PO CPCR: 1.0000 | ORAL_CAPSULE | Freq: Every day | ORAL | Status: DC

## 2016-01-25 ENCOUNTER — Encounter: Payer: Self-pay | Admitting: Family Medicine

## 2016-01-25 ENCOUNTER — Ambulatory Visit (INDEPENDENT_AMBULATORY_CARE_PROVIDER_SITE_OTHER): Admitting: Family Medicine

## 2016-01-25 VITALS — BP 104/62 | HR 72 | Ht 74.0 in | Wt 176.0 lb

## 2016-01-25 DIAGNOSIS — M940 Chondrocostal junction syndrome [Tietze]: Secondary | ICD-10-CM

## 2016-01-25 DIAGNOSIS — M9908 Segmental and somatic dysfunction of rib cage: Secondary | ICD-10-CM

## 2016-01-25 DIAGNOSIS — M9902 Segmental and somatic dysfunction of thoracic region: Secondary | ICD-10-CM | POA: Diagnosis not present

## 2016-01-25 DIAGNOSIS — M9901 Segmental and somatic dysfunction of cervical region: Secondary | ICD-10-CM

## 2016-01-25 DIAGNOSIS — G20 Parkinson's disease: Secondary | ICD-10-CM

## 2016-01-25 DIAGNOSIS — M999 Biomechanical lesion, unspecified: Secondary | ICD-10-CM | POA: Insufficient documentation

## 2016-01-25 NOTE — Progress Notes (Signed)
Pre visit review using our clinic review tool, if applicable. No additional management support is needed unless otherwise documented below in the visit note. 

## 2016-01-25 NOTE — Assessment & Plan Note (Signed)
Decision today to treat with OMT was based on Physical Exam  After verbal consent patient was treated with HVLA, ME, FPR techniques in thoracic, rib in cervical areas  Patient tolerated the procedure well with improvement in symptoms  Patient given exercises, stretches and lifestyle modifications  See medications in patient instructions if given  Patient will follow up in 3 weeks

## 2016-01-25 NOTE — Assessment & Plan Note (Signed)
I believe the patient is having increasing muscle tone in patient is having some fatigue. I think discusses a slipped rib as well. We discussed icing regimen. We discussed which activities to do an which was potentially avoid. Patient work with Event organiser that I think will be very unofficial. Patient and will come back and see me again in 3 weeks. Trial of topical anti-inflammatories and we discussed over-the-counter medications.

## 2016-01-25 NOTE — Patient Instructions (Signed)
Good to  see you  Ice 20 minutes 2 times daily. Usually after activity and before bed. Exercises 3 times a week.  Exercises on wall.  Heel and butt touching.  Raise leg 6 inches and hold 2 seconds.  Down slow for count of 4 seconds.  1 set of 30 reps daily on both sides.  Vitamin D 2000 IU daily pennsaid pinkie amount topically 2 times daily as needed.  See me again in 3 weeks.  With the elbow try brace at night and avoid overhand lifting for until I see you again.

## 2016-01-25 NOTE — Progress Notes (Signed)
[name] [address] [place], [place] [address] Phone: [phone] Subjective:    I'm seeing this patient by the request  of:  [name],[name], MD   CC: Right sided back pain  [id number] Ian [name] is a 55 y.o. male coming in with complaint of right-sided back pain. Patient describes the pain as more of a dull, throbbing aching sensation that can give him some cramping. Past medical history significant for Parkinson's disease. Seems when he does increasing activity he has more difficulty. Patient denies any radiation down the arm. Denies any associated neck pain. States though that he can stop him from activities and he has to shift positions. Sometimes uncomfortable at night laying on the side as well. Denies any trouble with shortness of breath or any association with food. Rates the severity of pain a 5 out of 10. Seems to be concerning a because it seems to be increasing in frequency as well as potentially severity.     Past Medical History  Diagnosis Date  . PD (Parkinson's disease) (HCC) 09/10/2013   Past Surgical History  Procedure Laterality Date  . None     Social History   Social History  . Marital Status: Married    Spouse Name: N/A  . Number of Children: N/A  . Years of Education: N/A   Social History Main Topics  . Smoking status: Never Smoker   . Smokeless tobacco: Never Used  . Alcohol Use: Yes     Comment: Occ  . Drug Use: None  . Sexual Activity: Not Asked   Other Topics Concern  . None   Social History Narrative   No Known Allergies Family History  Problem Relation Age of Onset  . Diabetes Other   . Cancer Other     Past medical history, social, surgical and family history all reviewed in electronic medical record.  No pertanent information unless stated regarding to the chief complaint.   Review of Systems: No headache, visual changes, nausea, vomiting, diarrhea, constipation, dizziness, abdominal pain,  skin rash, fevers, chills, night sweats, weight loss, swollen lymph nodes, body aches, joint swelling, muscle aches, chest pain, shortness of breath, mood changes.   Objective Blood pressure 104/62, pulse 72, height  (1.88 m), weight 176 lb (79.833 kg), SpO2 99 %.  General: No apparent distress alert and oriented x3 mood and affect normal, dressed appropriately. Very mild masked facies HEENT: Pupils equal, extraocular movements intact  Respiratory: Patient's speak in full sentences and does not appear short of breath  Cardiovascular: No lower extremity edema, non tender, no erythema  Skin: Warm dry intact with no signs of infection or rash on extremities or on axial skeleton.  Abdomen: Soft nontender  Neuro: Cranial nerves II through XII are intact, neurovascularly intact in all extremities with 2+ DTRs and 2+ pulses.  Lymph: No lymphadenopathy of posterior or anterior cervical chain or axillae bilaterally.  Gait normal with good balance and coordination.  MSK:  Non tender with full range of motion and good stability and symmetric strength and tone of shoulders, elbows, wrist, hip, knee and ankles bilaterally. Mild rigidity with exercises movement.  Back Exam:  Inspection: Unremarkable  Motion: Flexion 45 deg, Extension 25 deg mild rigidity noted, Side Bending to 45 deg bilaterally,  Rotation to 45 deg bilaterally  SLR laying: Negative  XSLR laying: Negative   Palpable tenderness: Tender to palpation over the seventh rib on the right side. Mild scapular dyskinesia noted. FABER: negative.  Sensory change: Gross sensation intact to all lumbar and sacral dermatomes.  Reflexes: 2+ at both patellar tendons, 2+ at achilles tendons, Babinski's downgoing.  Strength at foot  Plantar-flexion: 5/5 Dorsi-flexion: 5/5 Eversion: 5/5 Inversion: 5/5  Leg strength   Quad: 5/5 Hamstring: 5/5 Hip flexor: 5/5 Hip abductors: 4/5  Gait unremarkable.  Osteopathic findings C6 flexed rotated and side bent  left T7 extended rotated and side bent right with inhaled seventh rib T9 extended rotated and side bent left   procedure note 97110; 15 minutes spent for Therapeutic exercises as stated in above notes.Basic scapular stabilization to include adduction and depression of scapula Scaption, focusing on proper movement and good control Internal and External rotation utilizing a theraband, with elbow tucked at side entire time Rows with theraband  This included exercises focusing on stretching, strengthening, with significant focus on eccentric aspects.   Proper technique shown and discussed handout in great detail with ATC.  All questions were discussed and answered.     Impression and Recommendations:     This case required medical decision making of moderate complexity.      Note: This dictation was prepared with Dragon dictation along with smaller phrase technology. Any transcriptional errors that result from this process are unintentional.

## 2016-02-10 ENCOUNTER — Encounter: Payer: Self-pay | Admitting: Neurology

## 2016-02-10 MED ORDER — ROPINIROLE HCL ER 6 MG PO TB24: 1.0000 | ORAL_TABLET | Freq: Two times a day (BID) | ORAL | Status: DC

## 2016-02-10 NOTE — Telephone Encounter (Signed)
Requip refill requested. Per last office note- patient to remain on medication. Refill approved and sent to patient's pharmacy.   

## 2016-02-15 ENCOUNTER — Ambulatory Visit: Admitting: Family Medicine

## 2016-02-29 ENCOUNTER — Encounter: Payer: Self-pay | Admitting: Family Medicine

## 2016-02-29 ENCOUNTER — Ambulatory Visit (INDEPENDENT_AMBULATORY_CARE_PROVIDER_SITE_OTHER): Admitting: Family Medicine

## 2016-02-29 VITALS — BP 104/62 | HR 72 | Ht 74.0 in | Wt 173.0 lb

## 2016-02-29 DIAGNOSIS — M9908 Segmental and somatic dysfunction of rib cage: Secondary | ICD-10-CM

## 2016-02-29 DIAGNOSIS — M940 Chondrocostal junction syndrome [Tietze]: Secondary | ICD-10-CM

## 2016-02-29 DIAGNOSIS — M9901 Segmental and somatic dysfunction of cervical region: Secondary | ICD-10-CM | POA: Diagnosis not present

## 2016-02-29 DIAGNOSIS — M9902 Segmental and somatic dysfunction of thoracic region: Secondary | ICD-10-CM

## 2016-02-29 DIAGNOSIS — M999 Biomechanical lesion, unspecified: Secondary | ICD-10-CM

## 2016-02-29 NOTE — Progress Notes (Signed)
Pre visit review using our clinic review tool, if applicable. No additional management support is needed unless otherwise documented below in the visit note. 

## 2016-02-29 NOTE — Patient Instructions (Signed)
Great to see you Keep it up you are doing great  Keep hands within peripheral vision  ICe is your friend See me again in 6 weeks  Try a patellar strap (chopat) strap for the knee and see if that helps.

## 2016-02-29 NOTE — Progress Notes (Signed)
[name] [address] [place], [place] [address] Phone: [phone] Subjective:    I'm seeing this patient by the request  of:  [name],[name], MD   CC: Right sided back pain f/u   [id number] Ian [name] is a 55 y.o. male coming in with complaint of right-sided back pain. Patient describes the pain as more of a dull, throbbing aching sensation that can give him some cramping. Past medical history significant for Parkinson's disease. Patient was seen and did respond very well to osteopathic manipulation. Patient has been working on scapular strengthening exercises. An states that he is feeling significantly better. Still some mild discomfort from time to time but nothing that is as debilitating as was previously. Denies any numbness. Denies any new symptoms. Has been doing the exercises fairly regularly. Happy with the results of far.     Past Medical History  Diagnosis Date  . PD (Parkinson's disease) (HCC) 09/10/2013   Past Surgical History  Procedure Laterality Date  . None     Social History   Social History  . Marital Status: Married    Spouse Name: N/A  . Number of Children: N/A  . Years of Education: N/A   Social History Main Topics  . Smoking status: Never Smoker   . Smokeless tobacco: Never Used  . Alcohol Use: Yes     Comment: Occ  . Drug Use: None  . Sexual Activity: Not Asked   Other Topics Concern  . None   Social History Narrative   No Known Allergies Family History  Problem Relation Age of Onset  . Diabetes Other   . Cancer Other     Past medical history, social, surgical and family history all reviewed in electronic medical record.  No pertanent information unless stated regarding to the chief complaint.   Review of Systems: No headache, visual changes, nausea, vomiting, diarrhea, constipation, dizziness, abdominal pain, skin rash, fevers, chills, night sweats, weight loss, swollen lymph nodes, body aches,  joint swelling, muscle aches, chest pain, shortness of breath, mood changes.   Objective Blood pressure 104/62, pulse 72, height  (1.88 m), weight 173 lb (78.472 kg), SpO2 98 %.  General: No apparent distress alert and oriented x3 mood and affect normal, dressed appropriately. Very mild masked facies HEENT: Pupils equal, extraocular movements intact  Respiratory: Patient's speak in full sentences and does not appear short of breath  Cardiovascular: No lower extremity edema, non tender, no erythema  Skin: Warm dry intact with no signs of infection or rash on extremities or on axial skeleton.  Abdomen: Soft nontender  Neuro: Cranial nerves II through XII are intact, neurovascularly intact in all extremities with 2+ DTRs and 2+ pulses.  Lymph: No lymphadenopathy of posterior or anterior cervical chain or axillae bilaterally.  Gait normal with good balance and coordination.  MSK:  Non tender with full range of motion and good stability and symmetric strength and tone of shoulders, elbows, wrist, hip, knee and ankles bilaterally. Mild rigidity with exercises movement.  Back Exam:  Inspection: Unremarkable  Motion: Flexion 40 deg, Extension 25 deg mild rigidity noted, Side Bending to 35 deg bilaterally,  Rotation to 35 deg bilaterally  SLR laying: Negative  XSLR laying: Negative   Palpable tenderness: Still mild tenderness over the seventh rib but improved on the right side. FABER: negative. Sensory change: Gross sensation intact to all lumbar and sacral dermatomes.  Reflexes: 2+ at both patellar tendons, 2+ at achilles tendons, Babinski's  downgoing.  Strength at foot  Plantar-flexion: 5/5 Dorsi-flexion: 5/5 Eversion: 5/5 Inversion: 5/5  Leg strength   Quad: 5/5 Hamstring: 5/5 Hip flexor: 5/5 Hip abductors: 4/5  Gait unremarkable.  Osteopathic findings C2 flexed rotated and side bent right C6 flexed rotated and side bent left T7 extended rotated and side bent right with inhaled  seventh rib T9 extended rotated and side bent left T10 extended rotated and side bent right L2 flexed rotated and side bent right     Impression and Recommendations:     This case required medical decision making of moderate complexity.      Note: This dictation was prepared with Dragon dictation along with smaller phrase technology. Any transcriptional errors that result from this process are unintentional.

## 2016-02-29 NOTE — Assessment & Plan Note (Signed)
Estimated bleeding secondary to patient's hypertonicity secondary to his Parkinson's this can cause patient to have a slipped rib from time to time. Has responded very well to osteopathic manipulation. We discussed icing regimen. Encourage patient to continue to work on posture as well as scapular strengthening. We discussed which activities to avoid. Patient and will be spaced out a 5-6 week intervals.

## 2016-02-29 NOTE — Assessment & Plan Note (Signed)
Decision today to treat with OMT was based on Physical Exam  After verbal consent patient was treated with HVLA, ME, FPR techniques in thoracic, rib in cervical areas  Patient tolerated the procedure well with improvement in symptoms  Patient given exercises, stretches and lifestyle modifications  See medications in patient instructions if given  Patient will follow up in 5-6 weeks

## 2016-03-03 ENCOUNTER — Encounter: Payer: Self-pay | Admitting: Neurology

## 2016-03-03 ENCOUNTER — Ambulatory Visit (INDEPENDENT_AMBULATORY_CARE_PROVIDER_SITE_OTHER): Admitting: Neurology

## 2016-03-03 ENCOUNTER — Telehealth: Payer: Self-pay | Admitting: Neurology

## 2016-03-03 VITALS — BP 146/72 | HR 78 | Ht 74.0 in | Wt 173.0 lb

## 2016-03-03 DIAGNOSIS — G20 Parkinson's disease: Secondary | ICD-10-CM | POA: Diagnosis not present

## 2016-03-03 DIAGNOSIS — G4752 REM sleep behavior disorder: Secondary | ICD-10-CM

## 2016-03-03 MED ORDER — CARBIDOPA-LEVODOPA ER 48.75-195 MG PO CPCR: 1.0000 | ORAL_CAPSULE | Freq: Every day | ORAL | Status: DC

## 2016-03-03 MED ORDER — ROPINIROLE HCL ER 8 MG PO TB24: 8.0000 mg | ORAL_TABLET | Freq: Two times a day (BID) | ORAL | Status: DC

## 2016-03-03 NOTE — Progress Notes (Signed)
Ian PurserKenneth [name] was seen today in the movement disorders clinic for neurologic consultation at the request of his PCP, Dr. Drue [name].  He has previously seen  Ian L, MD and saw Dr. Edwin [name] in [place].   This patient is accompanied in the office by his spouse who supplements the history.The consultation is for the evaluation of PD.  I did review Dr. Carloyn [name]'s notes.  Dr. Hurley [name]'s notes are not available to me.    Hist first sx was tremor was R arm tremor.  It was in 2011 according to the patient today, but Dr. Faye [name]'s notes indicate that perhaps this was in 2009.  He was dx with enhanced physiologic tremor.  He was started on propranolol with some help, but made him sluggish.  He went to see Dr. Edwin [name] in 2013 for an opinion in [name].  Dr. 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  He was placed on baclofen and requip.  He thinks that the requip has helped; he is able to turn over in the bed better and move better.  The pt first saw Dr. Rubin [name] on 09/10/13.  Dr. Rubin [name] agreed with the Requip and start the patient on [name], but the patient is currently only on its once per day.  12/04/13 update:  The pt presents to the clinic today for his PD.  He is exercising faithfully.  He attends the PD exercise class through the rehab center. The pt was changed to the IR formulation of carbidopa/levodopa 25/100 last visit and is on it tid.  He is not sure it helps.  Afternoons are better than mornings.  carbidopa/levodopa 25/100 is taken at 8am/3:30 pm and bedtime. He is also on requip xl 6 mg daily.  Continues to have significant tremor.  01/27/14 update:  The patient presents today with his wife, who supplements the history.  The pt has a hx of PD and last visit, we decided to increase his carbidopa/levodopa to 2 po tid.  the patient states that he just felt lightheaded on this medication dosage  and up going back to [name] 3 times per day and then takes [name], half tablet twice per day. He remains on requip xl 6 mg daily.  Overall, he does feel fairly good.  He still has tremor.  He went to see Dr. Rubin [name] since our last visit and again talked about DBS therapy.  Dr. Rubin [name] mentioned to him that he looks much better and he had previously and had [name] score had declined.  Some yelling out in the sleep but this has not been a huge issue.  No sleepwalking/talking.  No falls.  He is exercising faithfully.  05/28/14 update:  The patient is presenting today for followup.  Last visit, I increased his [name] from 6 mg daily to 4 mg twice a day.  He is doing well with this.  Tremor is about the same.  He is on a strange combination of carbidopa levodopa, but when I tried to switch him off the IR version, the patient just felt lightheaded and ended up going back to a combination of IR and CR.  He is currently on 25/100 CR 3 times per day and then takes [name], half tablet twice per day and sometimes three times per day.  He does best in the evening and worst in the AM.   No swallowing troubles.  Has  dropped weight but thinks that it is exercise and was trying to follow gluten free diet. He is walking, doing a spinning class for PD, and has started golfing again, which he hasn't done in years.   Still considering DBS therapy.    08/27/14 update:  Patient presents today for followup.  He is currently on [name], 4mg  twice a day.  He is on a strange combination of carbidopa levodopa, but hasn't been able to tolerate the IR version well.[name].  He is currently on 25/100 CR 2-3 times per day and then 50/200 at night.  He rarely takes the IR now; only if playing golf or if he needs a boost during the day.  He is finding that he is having more tremor.  He is also having dreams at night that are making his sleep feel unrestful.  He is strongly  considering deep brain stimulation.  He asks me about details regarding this today.  He has asks me about potentially getting a refill on his Ativan.  He apparently got this filled from an outside physician about a year and a half ago and only had 10 pills in total and is just now running out.  He takes one pill every 3 or 4 months if he has to go to a function with his wife.  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  He asks me if he can have a refill today.  09/10/14 update:  The patient is seen today unexpectedly.  Last visit, I started [name] 145 mg, 2 tablets 3 times per day.  He is also on [name] 4 mg twice a day.  He called me initially to tell me that the [name] was working great and he felt much better on the medication.  In fact, he states that his PT and OT reported that he looked much better and his eval scores were markedly improved and they asked him what he had done differently.  He states last week was just a great week.  On Saturday, he was just a little lightheaded and his wife checked his orthostatics and he states that they were negative.  This week, however, he began to report some abnormal movements.  The movements were primarily of the left shoulder and in the abdomen. I wanted to see him on this medication before added or changed anything, as I have never seen him with dyskinesia.  Unfortunately, this morning he only took one of his tablets, but he did take 2 tablets at lunch time about 1 hour before coming.  He has not noticed the movements today.  He has noticed much less tremor overall since starting [name].  11/25/14 update:  The patient returns today for follow-up.  He is on [name] 4 mg twice a day and [name] 145 mg, 2 tablets 3 times per day.  His biggest issue is tremor, but states that its under good control today (but he just exercised).  He has developed tendinitis in the elbow.  He has clonazepam for REM behavior  disorder but admits he doesn't use it.  The patient remains very active in our Parkinson's exercise classes.  No falls.  Some days he does have to "fight" the posture and the "shuffling."  He didn't ever get to see Dr. Venetia MaxonStern as he states that his life got hectic moving his mother in law.    02/20/15 update:  The patient returns today, accompanied by his wife  who supplements the history.  Last visit, I increased his [name] to 195 mg, and he is taking 2 tablets 3 times a day.  He is also on [name] 4 mg twice a day.  He reports that he has not been doing as well because stress levels have been increased.  His mother-in-law, who is living with them, died unexpectedly.  Because of that, he has had more trouble sleeping as well.  He is still faithfully attending exercise classes and is involved in the Parkinson's community.  He has noticed more rigidity and tremor.  He is taking melatonin, 5 Mill grams at night to help him sleep but does not want to use clonazepam, even though he has at home.  05/24/25 update:  The patient returns today for follow-up.  His wife accompanies him and supplements the history.  He is on [name] 4 mg twice a day and [name] 195 mg, 2 tablets 3 times per day.  He has not wanted to increase the dose, although he likely could use more levodopa. States that he is more tremulous right now and attributes that to the fact that he has had a GI illness recently, which made sx's worse.  He has had emesis (one episode).  No diarrhea.  No fevers.    He has not had any falls.  He denies any hallucinations.  No confusion.  No lightheadedness or near syncope.  He continues to exercise faithfully.  06/12/15 update:  The patient is following up today, accompanied by his wife who supplements the history.  When I saw him last on 05/22/15, he was obviously underdosed and I expressed the need for more levodopa but the pt didn't want to increase it as he felt that his viral gastroenteritis was just causing his  worsening sx's.  He didn't tell me then that he had been off of his meds for 3 days and that is the reason he didn't look well.  They called me on 06/10/2015 however to state that he was doing much worse.  They wanted me to work him in that day, but I was not able to.  I didn't realize that he had been off all his PD meds since June 1 and had started receiving care from a natureopath in [place], Dr. Beckie BusingUrban.  He was given information on Dr. Beckie BusingUrban by his dentist Dr. Mellody DanceKeith, who told him he should try amino acid therapy.  He researched and started seeing Dr. Beckie BusingUrban and was told he needed to be off all his PD meds.  He started on D5-mucuna which markets itself as a "safe, natural" levodopa therapy.  He was told that he needed 4-6 months to see a response, but admits that he was told to tell his doctor what he was doing.  He was increasing the dose of this supplement based on urine dopamine levels which they were told were low.  They apparently were also measuring urine serotonin levels.  Not surprisingly, the patient decompensated.  He states that initially he didn't "feel that bad" but the "tremor was awful."  He also states that he had terrible vivid dreams and ultimately became exhausted from not sleeping, shaking and increased rigidity. It got so bad that he ended up in the ER 2 days ago.    He is back on rytary 195, 2 po tid.  He is a little quesy since being back on it.  He has only been on it for 2 days.  He states that he  talked with the natureopath and she told him to just "start back on it."  He is off of the D5-mucuna.  He and his wife bring in several articles for me to read today.    09/29/15 update:  The patient is following up today, accompanied by his wife who supplements the history.  He is on [name] 4 mg twice a day.  He reports that he is now taking  [name] 195 mg, 2 tablets 3 times a day along with [name] 145 mg, one tablet 3 times a day.  He spreads it out so that he takes it at 8 AM/3 PM/11 PM.   He does not necessarily halfway he describes as "traditional wearing off" but states that at the end of the dose he just does not feel good.  Overall, the patient states that he has been doing okay but he also feels that things are more unpredictable.  He feels more tired and thinks that he has mild dyskinesia.  When he eats, he is biting his lip in the same place.   He denies any lightheadedness or near syncope.  He denies falls.  When asked about depression, he states that he thinks "depression is a strong word" but admits that he gets frustrated and overwhelmed about the disease but uses exercise as a release.  He has sent me paperwork since last visit for his disability which has been completed.  He asks me about writing another letter regarding his VA disability as well.  12/30/15 update:  The patient is following up today.  Last visit, I increased his [name] to 6 mg twice a day.  His [name] has been slightly increased, so that he is on 195 mg, 3 tablets 3 times per day and he added an additional 1 tablet at night because he was awakening at 4 AM with tremor. He has changed that again, but taking the same overall dose of rytary, but is now taking 195, 3 in the AM, 2 in the afternoon, 3 in the evening and then takes 2 at bedtime.   Overall, he thinks he feels that it is a little inconsistent; sometimes he does well and sometimes he doesn't.  He continues to faithfully exercise.  No falls.  No hallucinations.  No syncope but has had some lightheadedness; he has had a uri and isn't sure if it is related to that/meds for that.  Mood has been good.  Sleep has been better since he has been back on his medications.  Appetite has been good.  No new medical issues.  He is still going to integrative therapies and thinks that it is helpful.  However, he did some therapy called alpha stim and was told that it changed the alpha waves in the brain and he didn't think that it was helpful and thinks that it made him feel  strange.  Feels that he has minor dyskinesia, especially of the arm when he walks or bends over.  Asks about DBS therapy and thinks he is going to consider that more this year.    03/03/16 update:  The patient is following up today.  He is on [name], 6 mg twice a day and [name] 195 mg on the following schedule: 3/3/3/2.  This is a little higher than last visit (prior 3/2/3/2).  He denies any falls since last visit.  He denies lightheadedness or near syncope.  No hallucinations.  Tremor has been pretty good, but it will fluctuate throughout the day (  states that he did 2 workouts today).  He does occasionally feel like he has some dyskinesia.  He saw Dr. [name] on February 6 and March 13 and I reviewed those records.  OMT was performed.  The patient feels that that was helpful for his musculoskeletal complaints.  He is doing lots of exercises.  Mood is stable.  Asks about meeting with Dr. Venetia Maxon for DBS but wife not ready to pursue.    Neuroimaging has previously been performed.  It is not available for my review today.  I did ask the patient to get me.  PREVIOUS MEDICATIONS: Requip and neupro (tried few days only); propranolol helped tremor some  ALLERGIES:  No Known Allergies  CURRENT MEDICATIONS:     Medication List       This list is accurate as of: 03/03/16  1:34 PM.  Always use your most recent med list.               Carbidopa-Levodopa ER 48.75-195 MG Cpcr  Commonly known as:  [name]  Take 1 tablet by mouth daily. 3 tablets at 8 am, 2 tablet at 1 pm, 3 tablets at 6 pm, 2 tablets at bedtime     COQ10 PO  Take 100 mg by mouth daily.     D-5000 5000 units Tabs  Generic drug:  Cholecalciferol  Take 1 tablet by mouth daily.     LECITHIN PO  Take by mouth daily. 3 tblsp     MAGNESIUM PO  Take 800 mg by mouth daily.     MULTI-VITAMINS Tabs  Take 1 tablet by mouth daily.     niacin 1000 MG CR tablet  Commonly known as:  NIASPAN  Take 1,000 mg by mouth daily.      Ropinirole HCl 6 MG Tb24  Commonly known as:  [name]  Take 1 tablet (6 mg total) by mouth 2 (two) times daily.     VITAMIN C PO  Take 5,000 mg by mouth daily.     vitamin E 400 UNIT capsule  Take 400 Units by mouth daily.         PAST MEDICAL HISTORY:   Past Medical History  Diagnosis Date  . PD (Parkinson's disease) (HCC) 09/10/2013    PAST SURGICAL HISTORY:   Past Surgical History  Procedure Laterality Date  . None      SOCIAL HISTORY:   Social History   Social History  . Marital Status: Married    Spouse Name: N/A  . Number of Children: N/A  . Years of Education: N/A   Occupational History  . Not on file.   Social History Main Topics  . Smoking status: Never Smoker   . Smokeless tobacco: Never Used  . Alcohol Use: Yes     Comment: Occ  . Drug Use: Not on file  . Sexual Activity: Not on file   Other Topics Concern  . Not on file   Social History Narrative    FAMILY HISTORY:   Family Status  Relation Status Death Age  . Mother Deceased 11    Breast Cancer  . Father Alive     Diabetes  . Sister Alive     healthy  . Daughter Alive     2, twins  . Son Alive     ROS: A complete 10 system review of systems was obtained and was unremarkable apart from what is mentioned above.  PHYSICAL EXAMINATION:    VITALS:   There were  no vitals filed for this visit. Wt Readings from Last 3 Encounters:  02/29/16 173 lb (78.472 kg)  01/25/16 176 lb (79.833 kg)  12/30/15 174 lb (78.926 kg)    GEN:  The patient appears stated age and is in NAD. HEENT:  Normocephalic, atraumatic.  The mucous membranes are moist. The superficial temporal arteries are without ropiness or tenderness. CV:  RRR Lungs:  CTAB Neck/HEME:  There are no carotid bruits bilaterally.  Neurological examination:  Orientation: The patient is alert and oriented x3. Fund of knowledge is appropriate.  Recent and remote memory are intact.  Attention and concentration are normal.    Able  to name objects and repeat phrases. Cranial nerves: There is good facial symmetry.  There is facial hypomimia.  Pupils are equal round and reactive to light bilaterally. The speech is fluent and clear. Soft palate rises symmetrically and there is no tongue deviation. Hearing is intact to conversational tone. Sensation: Sensation is intact to light touch throughout. Motor: Strength is 5/5 in the bilateral upper and lower extremities.   Shoulder shrug is equal and symmetric.  There is no pronator drift.   Movement examination: Tone: There is mod rigidity in the RUE (just took med at 1:30 and none since 8am).  Tone on the left was normal today Abnormal movements: There is bilateral LE tremor and occ RUE tremor Coordination:  There is decremation with RAM's, seen primarily with toe taps/heel taps today on the right, but it is better.   Gait and Station: The patient has no difficulty arising out of a deep-seated chair without the use of the hands. The patient's stride length is fairly good There is slightly decreased arm swing.   He is able to run down the hall well.   The patient has a negative pull test.      ASSESSMENT/PLAN:  1. idiopathic Parkinson's disease.  The patient has tremor, bradykinesia, rigidity and mild postural instability.  -Increase requip xl to to 8 mg bid (from 6 mg bid)  -continueRytary so that he is taking 3/3/3/2  -DBS was discussed again today at length.  States that he has researched it more and thinks that he wants to pursue that in late spring.  Wife not yet fully convinced but he thinks that he may be ready.  Will let me know when ready to pursue  -talked about mild lightheadedness may represent early dysautonomia but no need for meds right now.  Talked about staying hydrated.  Not ready for compression stocking/abdominal binder  -wants to talk with Dr. Venetia Maxon about DBS primarily because of spouse discomfort.  I have not yet done on/off or neuropsych testing but pt wants to  meet Dr. Venetia Maxon first and no objection.  He had appt previous and cx. 2.  Possible depression   -Managing with exercise and thinks that he is doing fairly well.  3.  REM behavior disorder.  -He has clonazepam as needed but he does not wish to use that.  Continues to be a problem but doesn't want to be on klonopin 4.  follow up in 3-4 months.  Much greater than 50% of this visit was spent in counseling with the patient.  Total face to face time:  45 min

## 2016-03-03 NOTE — Telephone Encounter (Signed)
Spoke with pharmacist. She states Requip 8 mg is unavailable by the manufacturer. Please advise.

## 2016-03-03 NOTE — Telephone Encounter (Signed)
Is the 16mg  XL still available?  Could try the xl 16 mg qday instead of 8mg  bid

## 2016-03-03 NOTE — Patient Instructions (Addendum)
1.  For 1 week, take [name] 8 mg in the AM and continue 6 mg at night and then then following week take [name] 8 mg twice a day. 2. We are referring you to Dr Venetia MaxonStern at [facility]. They will call you directly to set up an appointment date and time. If you do not hear from them they can be contacted directly at [phone].

## 2016-03-03 NOTE — Telephone Encounter (Signed)
Referral faxed to [facility] at [phone] with confirmation received. They will contact the patient to schedule.  

## 2016-03-03 NOTE — Telephone Encounter (Signed)
[facility] needs to talk to someone about rx please call [phone]

## 2016-03-04 MED ORDER — ROPINIROLE HCL ER 4 MG PO TB24: 8.0000 mg | ORAL_TABLET | Freq: Two times a day (BID) | ORAL | Status: DC

## 2016-03-04 NOTE — Telephone Encounter (Signed)
Ask pt if he minds changing back to regular requip.  He already takes rytary many times a day so dosing frequency shouldn't be an issue.

## 2016-03-04 NOTE — Telephone Encounter (Signed)
No, it is not available. Highest dose is 6 mg.

## 2016-03-04 NOTE — Telephone Encounter (Signed)
Mychart message sent to patient.

## 2016-03-28 ENCOUNTER — Ambulatory Visit (INDEPENDENT_AMBULATORY_CARE_PROVIDER_SITE_OTHER): Admitting: Family Medicine

## 2016-03-28 ENCOUNTER — Encounter: Payer: Self-pay | Admitting: Family Medicine

## 2016-03-28 VITALS — BP 100/70 | HR 82 | Ht 74.0 in | Wt 171.0 lb

## 2016-03-28 DIAGNOSIS — M9901 Segmental and somatic dysfunction of cervical region: Secondary | ICD-10-CM | POA: Diagnosis not present

## 2016-03-28 DIAGNOSIS — M999 Biomechanical lesion, unspecified: Secondary | ICD-10-CM

## 2016-03-28 DIAGNOSIS — M9902 Segmental and somatic dysfunction of thoracic region: Secondary | ICD-10-CM | POA: Diagnosis not present

## 2016-03-28 DIAGNOSIS — M940 Chondrocostal junction syndrome [Tietze]: Secondary | ICD-10-CM | POA: Diagnosis not present

## 2016-03-28 DIAGNOSIS — M9908 Segmental and somatic dysfunction of rib cage: Secondary | ICD-10-CM | POA: Diagnosis not present

## 2016-03-28 DIAGNOSIS — G20 Parkinson's disease: Secondary | ICD-10-CM

## 2016-03-28 NOTE — Assessment & Plan Note (Signed)
Decision today to treat with OMT was based on Physical Exam  After verbal consent patient was treated with HVLA, ME, FPR techniques in thoracic, rib in cervical areas  Patient tolerated the procedure well with improvement in symptoms  Patient given exercises, stretches and lifestyle modifications  See medications in patient instructions if given  Patient will follow up in 5-6 weeks

## 2016-03-28 NOTE — Progress Notes (Signed)
Pre visit review using our clinic review tool, if applicable. No additional management support is needed unless otherwise documented below in the visit note. 

## 2016-03-28 NOTE — Patient Instructions (Signed)
Good to see yo u Ian [name] is your friend if it acts up Keep doing what you are doing I am impressed Have fun on the course See me again in 4-6 weeks

## 2016-03-28 NOTE — Assessment & Plan Note (Signed)
Seems to moderate this time. We will continue to monitor. Does increase the likelihood of slipped rib. Patient though is staying active has helped tremendously.

## 2016-03-28 NOTE — Assessment & Plan Note (Signed)
Extremely impressive patient at this time. I do not want him to make any significant changes. We did discussnow from 4 weeks to 5 week intervals. Patient will continue to stay active and continue to work back strength. Follow-up again in 5 weeks.

## 2016-03-28 NOTE — Progress Notes (Signed)
[name] [address] [place], [place] [address] Phone: [phone] Subjective:    I'm seeing this patient by the request  of:  [name],[name], MD   NF:AOZHYQM rib syndrome follow-up  VHQ:[id number] Ian [name] is a 54 y.o. male coming in with complaint of right-sided back pain. 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 Patient has been increasing his activity and lifting on a more regular basis. Feels like he is making significant progress. States that the pain is significantly less. Not taking any medicine on a regular basis for pain.     Past Medical History  Diagnosis Date  . PD (Parkinson's disease) (HCC) 09/10/2013   Past Surgical History  Procedure Laterality Date  . None     Social History   Social History  . Marital Status: Married    Spouse Name: N/A  . Number of Children: N/A  . Years of Education: N/A   Social History Main Topics  . Smoking status: Never Smoker   . Smokeless tobacco: Never Used  . Alcohol Use: Yes     Comment: Occ  . Drug Use: None  . Sexual Activity: Not Asked   Other Topics Concern  . None   Social History Narrative   No Known Allergies Family History  Problem Relation Age of Onset  . Diabetes Other   . Cancer Other     Past medical history, social, surgical and family history all reviewed in electronic medical record.  No pertanent information unless stated regarding to the chief complaint.   Review of Systems: No headache, visual changes, nausea, vomiting, diarrhea, constipation, dizziness, abdominal pain, skin rash, fevers, chills, night sweats, weight loss, swollen lymph nodes, body aches, joint swelling, muscle aches, chest pain, shortness of breath, mood changes.   Objective Blood pressure 100/70, pulse 82, height  (1.88 m), weight 171 lb (77.565 kg), SpO2 97 %.  General: No apparent distress alert and oriented x3 mood and affect normal, dressed appropriately.  Very mild masked facies HEENT: Pupils equal, extraocular movements intact  Respiratory: Patient's speak in full sentences and does not appear short of breath  Cardiovascular: No lower extremity edema, non tender, no erythema  Skin: Warm dry intact with no signs of infection or rash on extremities or on axial skeleton.  Abdomen: Soft nontender  Neuro: Cranial nerves II through XII are intact, neurovascularly intact in all extremities with 2+ DTRs and 2+ pulses.  Lymph: No lymphadenopathy of posterior or anterior cervical chain or axillae bilaterally.  Gait normal with good balance and coordination.  MSK:  Non tender with full range of motion and good stability and symmetric strength and tone of shoulders, elbows, wrist, hip, knee and ankles bilaterally. Mild rigidity with exercises movement.  Back Exam:  Inspection: Unremarkable  Motion: Flexion 40 deg, Extension 25 deg mild cogwheeling noted but states that the rigidity seems to be a little improvement Side Bending to 35 deg bilaterally,  Rotation to 35 deg bilaterally  SLR laying: Negative  XSLR laying: Negative   Palpable tenderness: continued improvement with less stiffness FABER: negative. Sensory change: Gross sensation intact to all lumbar and sacral dermatomes.  Reflexes: 2+ at both patellar tendons, 2+ at achilles tendons, Babinski's downgoing.  Strength at foot  Plantar-flexion: 5/5 Dorsi-flexion: 5/5 Eversion: 5/5 Inversion: 5/5  Leg strength   Quad: 5/5 Hamstring: 5/5 Hip flexor: 5/5 Hip abductors: 4/5  Gait unremarkable.  Osteopathic findings C2 flexed rotated and side  bent right C4 flexed rotated and side bent left T7 extended rotated and side bent right with inhaled seventh rib T9 extended rotated and side bent left L2 flexed rotated and side bent right Sacrum left on left    Impression and Recommendations:     This case required medical decision making of moderate complexity.      Note: This dictation was  prepared with Dragon dictation along with smaller phrase technology. Any transcriptional errors that result from this process are unintentional.

## 2016-03-30 ENCOUNTER — Ambulatory Visit: Admitting: Neurology

## 2016-04-07 ENCOUNTER — Telehealth: Payer: Self-pay | Admitting: Neurology

## 2016-04-07 NOTE — Telephone Encounter (Signed)
Records from 10/2015 faxed to [facility] per their request at [phone] with confirmation received.

## 2016-04-25 ENCOUNTER — Encounter: Payer: Self-pay | Admitting: Family Medicine

## 2016-04-25 ENCOUNTER — Ambulatory Visit (INDEPENDENT_AMBULATORY_CARE_PROVIDER_SITE_OTHER): Admitting: Family Medicine

## 2016-04-25 VITALS — BP 118/72 | HR 80 | Wt 170.0 lb

## 2016-04-25 DIAGNOSIS — M9902 Segmental and somatic dysfunction of thoracic region: Secondary | ICD-10-CM

## 2016-04-25 DIAGNOSIS — M940 Chondrocostal junction syndrome [Tietze]: Secondary | ICD-10-CM | POA: Diagnosis not present

## 2016-04-25 DIAGNOSIS — M9908 Segmental and somatic dysfunction of rib cage: Secondary | ICD-10-CM | POA: Diagnosis not present

## 2016-04-25 DIAGNOSIS — M9901 Segmental and somatic dysfunction of cervical region: Secondary | ICD-10-CM

## 2016-04-25 DIAGNOSIS — M999 Biomechanical lesion, unspecified: Secondary | ICD-10-CM

## 2016-04-25 NOTE — Progress Notes (Signed)
Pre visit review using our clinic review tool, if applicable. No additional management support is needed unless otherwise documented below in the visit note. 

## 2016-04-25 NOTE — Patient Instructions (Signed)
Good to see you  Ice is your friend I am impressed Keep doing what you are doing  See me again in 4 weeks.

## 2016-04-25 NOTE — Assessment & Plan Note (Signed)
Doing remarkably well at this time. Having less muscle stiffness. Encourage him to continue the vitamin supplementation. We will try to space patient now to 6-8 week intervals. We discussed if worsening symptoms patient will come back sooner. Has been doing very well with 4 week intervals and I would be fine as well.

## 2016-04-25 NOTE — Progress Notes (Signed)
[name] [address] [place], [place] [address] Phone: [phone] Subjective:    I'm seeing this patient by the request  of:  [name],[name], MD   BJ:YNWGNFACC:slipped rib syndrome follow-up  OZH:[id number]:Subjective Ian PurserKenneth [name] is a 55 y.o. male coming in with complaint of right-sided back pain. 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 Not having any significant rigidity at the moment. Continues to increase his activity slowly. Not having as much pain. Happy with the results.     Past Medical History  Diagnosis Date  . PD (Parkinson's disease) (HCC) 09/10/2013   Past Surgical History  Procedure Laterality Date  . None     Social History   Social History  . Marital Status: Married    Spouse Name: N/A  . Number of Children: N/A  . Years of Education: N/A   Social History Main Topics  . Smoking status: Never Smoker   . Smokeless tobacco: Never Used  . Alcohol Use: Yes     Comment: Occ  . Drug Use: None  . Sexual Activity: Not Asked   Other Topics Concern  . None   Social History Narrative   No Known Allergies Family History  Problem Relation Age of Onset  . Diabetes Other   . Cancer Other     Past medical history, social, surgical and family history all reviewed in electronic medical record.  No pertanent information unless stated regarding to the chief complaint.   Review of Systems: No headache, visual changes, nausea, vomiting, diarrhea, constipation, dizziness, abdominal pain, skin rash, fevers, chills, night sweats, weight loss, swollen lymph nodes, body aches, joint swelling, muscle aches, chest pain, shortness of breath, mood changes.   Objective Blood pressure 118/72, pulse 80, weight 170 lb (77.111 kg).  General: No apparent distress alert and oriented x3 mood and affect normal, dressed appropriately. Very mild masked facies HEENT: Pupils equal, extraocular  movements intact  Respiratory: Patient's speak in full sentences and does not appear short of breath  Cardiovascular: No lower extremity edema, non tender, no erythema  Skin: Warm dry intact with no signs of infection or rash on extremities or on axial skeleton.  Abdomen: Soft nontender  Neuro: Cranial nerves II through XII are intact, neurovascularly intact in all extremities with 2+ DTRs and 2+ pulses.  Lymph: No lymphadenopathy of posterior or anterior cervical chain or axillae bilaterally.  Gait normal with good balance and coordination.  MSK:  Non tender with full range of motion and good stability and symmetric strength and tone of shoulders, elbows, wrist, hip, knee and ankles bilaterally. Mild rigidity with exercises movement.  Back Exam:  Inspection: Unremarkable  Motion: Flexion 40 deg, Extension 25 deg  Side Bending to 35 deg bilaterally,  Rotation to 35 deg bilaterally  SLR laying: Negative  XSLR laying: Negative   Palpable tenderness: continued improvement with less stiffness. Continues improvement. FABER: negative. Sensory change: Gross sensation intact to all lumbar and sacral dermatomes.  Reflexes: 2+ at both patellar tendons, 2+ at achilles tendons, Babinski's downgoing.  Strength at foot  Plantar-flexion: 5/5 Dorsi-flexion: 5/5 Eversion: 5/5 Inversion: 5/5  Leg strength   Quad: 5/5 Hamstring: 5/5 Hip flexor: 5/5 Hip abductors: 4/5  Gait unremarkable.  Osteopathic findings C2 flexed rotated and side bent right T7 extended rotated and side bent right with inhaled seventh rib L2 flexed rotated and side bent right Sacrum left on left  Impression and Recommendations:     This case required medical decision making of moderate complexity.      Note: This dictation was prepared with Dragon dictation along with smaller phrase technology. Any transcriptional errors that result from this process are unintentional.

## 2016-04-25 NOTE — Assessment & Plan Note (Signed)
Decision today to treat with OMT was based on Physical Exam  After verbal consent patient was treated with HVLA, ME, FPR techniques in thoracic, rib in cervical areas  Patient tolerated the procedure well with improvement in symptoms  Patient given exercises, stretches and lifestyle modifications  See medications in patient instructions if given  Patient will follow up in 4-8 weeks      

## 2016-04-26 ENCOUNTER — Encounter: Payer: Self-pay | Admitting: Neurology

## 2016-05-09 ENCOUNTER — Telehealth: Payer: Self-pay | Admitting: Neurology

## 2016-05-09 NOTE — Telephone Encounter (Signed)
Mychart message sent to patient.

## 2016-05-09 NOTE — Telephone Encounter (Signed)
Got message from Dr. Venetia MaxonStern that pt ready for DBS.  However, he is not ready from my standpoint and he had told me that he just wanted to meet Dr. Venetia MaxonStern.  I haven't done on/off testing, MRI or neuropsych testing at all.  If he wants to consider DBS, he will need those things done and a much deeper discussion about DBS.  If wants to proceed, will start with on/off testing

## 2016-05-23 ENCOUNTER — Ambulatory Visit (INDEPENDENT_AMBULATORY_CARE_PROVIDER_SITE_OTHER): Admitting: Family Medicine

## 2016-05-23 ENCOUNTER — Encounter: Payer: Self-pay | Admitting: Neurology

## 2016-05-23 ENCOUNTER — Encounter: Payer: Self-pay | Admitting: Family Medicine

## 2016-05-23 VITALS — BP 104/70 | HR 88 | Ht 74.0 in | Wt 169.0 lb

## 2016-05-23 DIAGNOSIS — M9908 Segmental and somatic dysfunction of rib cage: Secondary | ICD-10-CM | POA: Diagnosis not present

## 2016-05-23 DIAGNOSIS — M9902 Segmental and somatic dysfunction of thoracic region: Secondary | ICD-10-CM

## 2016-05-23 DIAGNOSIS — M9901 Segmental and somatic dysfunction of cervical region: Secondary | ICD-10-CM

## 2016-05-23 DIAGNOSIS — M940 Chondrocostal junction syndrome [Tietze]: Secondary | ICD-10-CM | POA: Diagnosis not present

## 2016-05-23 DIAGNOSIS — M999 Biomechanical lesion, unspecified: Secondary | ICD-10-CM

## 2016-05-23 NOTE — Progress Notes (Signed)
Pre visit review using our clinic review tool, if applicable. No additional management support is needed unless otherwise documented below in the visit note. 

## 2016-05-23 NOTE — Patient Instructions (Signed)
Good to see you  Ice is your friend Stay active Don't change up much.  See me again in 4 weeks. Try to get some travel plans!!!

## 2016-05-23 NOTE — Assessment & Plan Note (Signed)
Patient does state that he is been changing medications around somewhat. I do feel that he had some mild increasing tightness recently. I think that this could be secondary to the Parkinson's. Patient will continue to work with his positions. Overall not doing relatively band. Patient will continue to be active. Encourage him to do the exercises regularly. Patient will see me again in 4 weeks. We will continue on same pattern.

## 2016-05-23 NOTE — Assessment & Plan Note (Signed)
Decision today to treat with OMT was based on Physical Exam  After verbal consent patient was treated with HVLA, ME, FPR techniques in thoracic, rib in cervical areas  Patient tolerated the procedure well with improvement in symptoms  Patient given exercises, stretches and lifestyle modifications  See medications in patient instructions if given  Patient will follow up in 4-8 weeks      

## 2016-05-23 NOTE — Progress Notes (Signed)
[name] [address] [place], [place] [address] Phone: [phone] Subjective:    I'm seeing this patient by the request  of:  [name],[name], MD   QI:ONGEXBMCC:slipped rib syndrome follow-up  WUX:[id number]:Subjective Ian PurserKenneth [name] is a 55 y.o. male coming in with complaint of right-sided back pain. 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 Not having any significant rigidity at the moment. Continues to increase his activity slowly. Not having as much pain. Happy with the results.     Past Medical History  Diagnosis Date  . PD (Parkinson's disease) (HCC) 09/10/2013   Past Surgical History  Procedure Laterality Date  . None     Social History   Social History  . Marital Status: Married    Spouse Name: N/A  . Number of Children: N/A  . Years of Education: N/A   Social History Main Topics  . Smoking status: Never Smoker   . Smokeless tobacco: Never Used  . Alcohol Use: Yes     Comment: Occ  . Drug Use: Not on file  . Sexual Activity: Not on file   Other Topics Concern  . Not on file   Social History Narrative   No Known Allergies Family History  Problem Relation Age of Onset  . Diabetes Other   . Cancer Other     Past medical history, social, surgical and family history all reviewed in electronic medical record.  No pertanent information unless stated regarding to the chief complaint.   Review of Systems: No headache, visual changes, nausea, vomiting, diarrhea, constipation, dizziness, abdominal pain, skin rash, fevers, chills, night sweats, weight loss, swollen lymph nodes, body aches, joint swelling, muscle aches, chest pain, shortness of breath, mood changes.   Objective There were no vitals taken for this visit.  General: No apparent distress alert and oriented x3 mood and affect normal, dressed appropriately. Very mild masked facies HEENT: Pupils equal, extraocular  movements intact  Respiratory: Patient's speak in full sentences and does not appear short of breath  Cardiovascular: No lower extremity edema, non tender, no erythema  Skin: Warm dry intact with no signs of infection or rash on extremities or on axial skeleton.  Abdomen: Soft nontender  Neuro: Cranial nerves II through XII are intact, neurovascularly intact in all extremities with 2+ DTRs and 2+ pulses.  Lymph: No lymphadenopathy of posterior or anterior cervical chain or axillae bilaterally.  Gait normal with good balance and coordination.  MSK:  Non tender with full range of motion and good stability and symmetric strength and tone of shoulders, elbows, wrist, hip, knee and ankles bilaterally. Mild rigidity with movement mostly on the right side. Mild resting tremor of the right lower extremity Back Exam:  Inspection: Unremarkable  Motion: Flexion 35 deg, Extension 25 deg  Side Bending to 35 deg bilaterally,  Rotation to 35 deg bilaterally I'll increase in stiffness from previous exam SLR laying: Negative  XSLR laying: Negative   Palpable tenderness: Mild tenderness at the thoracolumbar juncture. Spinal musculature  FABER: negative. Sensory change: Gross sensation intact to all lumbar and sacral dermatomes.  Reflexes: 2+ at both patellar tendons, 2+ at achilles tendons, Babinski's downgoing.  Strength at foot  Plantar-flexion: 5/5 Dorsi-flexion: 5/5 Eversion: 5/5 Inversion: 5/5  Leg strength   Quad: 5/5 Hamstring: 5/5 Hip flexor: 5/5 Hip abductors: 4/5  Gait unremarkable.  Osteopathic findings C2 flexed rotated and side bent right C4 flexed  rotated and side bent left T7 extended rotated and side bent right with inhaled seventh rib L2 flexed rotated and side bent right Sacrum left on left    Impression and Recommendations:     This case required medical decision making of moderate complexity.      Note: This dictation was prepared with Dragon dictation along with smaller  phrase technology. Any transcriptional errors that result from this process are unintentional.

## 2016-05-23 NOTE — Telephone Encounter (Signed)
Clonazepam refill requested. Per last office note- patient to remain on medication. Refill approved and sent to patient's pharmacy.   Dr. Arbutus Leasat - see mychart message.

## 2016-06-22 ENCOUNTER — Encounter: Payer: Self-pay | Admitting: Neurology

## 2016-06-23 ENCOUNTER — Ambulatory Visit (INDEPENDENT_AMBULATORY_CARE_PROVIDER_SITE_OTHER): Admitting: Family Medicine

## 2016-06-23 ENCOUNTER — Encounter: Payer: Self-pay | Admitting: Family Medicine

## 2016-06-23 VITALS — BP 114/68 | HR 92 | Ht 74.0 in | Wt 170.0 lb

## 2016-06-23 DIAGNOSIS — M940 Chondrocostal junction syndrome [Tietze]: Secondary | ICD-10-CM | POA: Diagnosis not present

## 2016-06-23 DIAGNOSIS — M9902 Segmental and somatic dysfunction of thoracic region: Secondary | ICD-10-CM

## 2016-06-23 DIAGNOSIS — M9908 Segmental and somatic dysfunction of rib cage: Secondary | ICD-10-CM | POA: Diagnosis not present

## 2016-06-23 DIAGNOSIS — M9901 Segmental and somatic dysfunction of cervical region: Secondary | ICD-10-CM

## 2016-06-23 DIAGNOSIS — M999 Biomechanical lesion, unspecified: Secondary | ICD-10-CM

## 2016-06-23 NOTE — Progress Notes (Signed)
Pre visit review using our clinic review tool, if applicable. No additional management support is needed unless otherwise documented below in the visit note. 

## 2016-06-23 NOTE — Assessment & Plan Note (Signed)
Mild increasing tightness. No significant rigidity. Patient continues to have some difficulty with range of motion. We discussed icing regimen. We discussed with patient again for continuing more of the stretching regimen as well. Patient will continue to be active. Patient feels in 4 week intervals has significant he helped his range of motion and once to continue.

## 2016-06-23 NOTE — Progress Notes (Signed)
[name] [address] [place], [place] [address] Phone: [phone] Subjective:    I'm seeing this patient by the request  of:  [name],[name], MD   YT:KZSWFUXCC:slipped rib syndrome follow-up  NAT:[id number]:Subjective Ian PurserKenneth [name] is a 55 y.o. male coming in with complaint of right-sided back pain. 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 Continues to be active overall. Has noticed some mild tightness. Especially the right shoulder scapular region. States that it came even uncomfortable at night. Little worse than his previous exam he states. Did respond well to the osteopathic manipulation. Continues with the vitamin supplementations.     Past Medical History  Diagnosis Date  . PD (Parkinson's disease) (HCC) 09/10/2013   Past Surgical History  Procedure Laterality Date  . None     Social History   Social History  . Marital Status: Married    Spouse Name: N/A  . Number of Children: N/A  . Years of Education: N/A   Social History Main Topics  . Smoking status: Never Smoker   . Smokeless tobacco: Never Used  . Alcohol Use: Yes     Comment: Occ  . Drug Use: None  . Sexual Activity: Not Asked   Other Topics Concern  . None   Social History Narrative   No Known Allergies Family History  Problem Relation Age of Onset  . Diabetes Other   . Cancer Other     Past medical history, social, surgical and family history all reviewed in electronic medical record.  No pertanent information unless stated regarding to the chief complaint.   Review of Systems: No headache, visual changes, nausea, vomiting, diarrhea, constipation, dizziness, abdominal pain, skin rash, fevers, chills, night sweats, weight loss, swollen lymph nodes, chest pain, shortness of breath, mood changes.   Objective Blood pressure 114/68, pulse 92, height 6\' 2"  (1.88 m), weight 170 lb (77.111 kg), SpO2 97 %.  General: No  apparent distress alert and oriented x3 mood and affect normal, dressed appropriately. Very mild masked facies HEENT: Pupils equal, extraocular movements intact  Respiratory: Patient's speak in full sentences and does not appear short of breath  Cardiovascular: No lower extremity edema, non tender, no erythema  Skin: Warm dry intact with no signs of infection or rash on extremities or on axial skeleton.  Abdomen: Soft nontender  Neuro: Cranial nerves II through XII are intact, neurovascularly intact in all extremities with 2+ DTRs and 2+ pulses.  Lymph: No lymphadenopathy of posterior or anterior cervical chain or axillae bilaterally.  Gait normal with good balance and coordination.  MSK:  Non tender with full range of motion and good stability and symmetric strength and tone of shoulders, elbows, wrist, hip, knee and ankles bilaterally. Mild rigidity with movement mostly on the right side. Mild resting tremor of the right lower extremity still present with no significant change Back Exam:  Inspection: Unremarkable  Motion: Flexion 35 deg, Extension 20 deg  Side Bending to 35 deg bilaterally,  Rotation to 35 deg bilaterally SLR laying: Negative  XSLR laying: Negative   Palpable tenderness: mild increasing discomfort around the medial aspect of the scapula in the paraspinal musculature FABER: negative. Sensory change: Gross sensation intact to all lumbar and sacral dermatomes.  Reflexes: 2+ at both patellar tendons, 2+ at achilles tendons, Babinski's downgoing.  Strength at foot  Plantar-flexion: 5/5 Dorsi-flexion: 5/5 Eversion: 5/5 Inversion: 5/5  Leg strength   Quad:  5/5 Hamstring: 5/5 Hip flexor: 5/5 Hip abductors: 4/5  Gait unremarkable.  Osteopathic findings C2 flexed rotated and side bent right T7 extended rotated and side bent right with inhaled seventh rib L2 flexed rotated and side bent right Sacrum left on left    Impression and Recommendations:     This case required  medical decision making of moderate complexity.      Note: This dictation was prepared with Dragon dictation along with smaller phrase technology. Any transcriptional errors that result from this process are unintentional.

## 2016-06-23 NOTE — Assessment & Plan Note (Signed)
Decision today to treat with OMT was based on Physical Exam  After verbal consent patient was treated with HVLA, ME, FPR techniques in thoracic, rib in cervical areas  Patient tolerated the procedure well with improvement in symptoms  Patient given exercises, stretches and lifestyle modifications  See medications in patient instructions if given  Patient will follow up in 4-8 weeks      

## 2016-06-23 NOTE — Patient Instructions (Signed)
Keep it up! You are doing great overall.  For the shoulder try icing before bed 10-20 minutes to see if it helps See me again in 4 weeks.

## 2016-06-24 ENCOUNTER — Encounter: Payer: Self-pay | Admitting: Neurology

## 2016-06-24 ENCOUNTER — Ambulatory Visit (INDEPENDENT_AMBULATORY_CARE_PROVIDER_SITE_OTHER): Admitting: Neurology

## 2016-06-24 DIAGNOSIS — G20 Parkinson's disease: Secondary | ICD-10-CM

## 2016-06-24 DIAGNOSIS — G4752 REM sleep behavior disorder: Secondary | ICD-10-CM

## 2016-06-24 DIAGNOSIS — G20B2 Parkinson's disease with dyskinesia, with fluctuations: Secondary | ICD-10-CM | POA: Insufficient documentation

## 2016-06-24 MED ORDER — CARBIDOPA-LEVODOPA ER 61.25-245 MG PO CPCR: 1.0000 | ORAL_CAPSULE | Freq: Once | ORAL | Status: DC

## 2016-06-24 MED ORDER — CARBIDOPA-LEVODOPA 25-100 MG PO TABS
3.0000 | ORAL_TABLET | Freq: Once | ORAL | Status: AC
  Administered 2016-06-24: 3 via ORAL

## 2016-06-24 NOTE — Progress Notes (Signed)
Ian [name] was seen today in the movement disorders clinic for neurologic consultation at the request of his PCP, [name].  He has previously seen  [name],[name], [name] in [place].   This patient is accompanied in the office by his spouse who supplements the history.The consultation is for the evaluation of PD.  I did review [name]'s notes.  [name]'s notes are not available to me.    Hist first sx was tremor was R arm tremor.  It was in 2011 according to the patient today, but [name]'s notes indicate that perhaps this was in 2009.  He was dx with enhanced physiologic tremor.  He was started on propranolol with some help, but made him sluggish.  He went to see [name] in 2013 for an opinion in [name].  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  He was placed on baclofen and requip.  He thinks that the requip has helped; he is able to turn over in the bed better and move better.  The pt first saw [name] on 09/10/13.  [name] agreed with the Requip and start the patient on [name], but the patient is currently only on its once per day.  12/04/13 update:  The pt presents to the clinic today for his PD.  He is exercising faithfully.  He attends the PD exercise class through the rehab center. The pt was changed to the IR formulation of carbidopa/levodopa 25/100 last visit and is on it tid.  He is not sure it helps.  Afternoons are better than mornings.  carbidopa/levodopa 25/100 is taken at 8am/3:30 pm and bedtime. He is also on requip xl 6 mg daily.  Continues to have significant tremor.  01/27/14 update:  The patient presents today with his wife, who supplements the history.  The pt has a hx of PD and last visit, we decided to increase his carbidopa/levodopa to 2 po tid.  the patient states that he just felt lightheaded on this medication dosage  and up going back to [name] 3 times per day and then takes [name], half tablet twice per day. He remains on requip xl 6 mg daily.  Overall, he does feel fairly good.  He still has tremor.  He went to see [name] since our last visit and again talked about DBS therapy.  [name] mentioned to him that he looks much better and he had previously and had [name] score had declined.  Some yelling out in the sleep but this has not been a huge issue.  No sleepwalking/talking.  No falls.  He is exercising faithfully.  05/28/14 update:  The patient is presenting today for followup.  Last visit, I increased his [name] from 6 mg daily to 4 mg twice a day.  He is doing well with this.  Tremor is about the same.  He is on a strange combination of carbidopa levodopa, but when I tried to switch him off the IR version, the patient just felt lightheaded and ended up going back to a combination of IR and CR.  He is currently on 25/100 CR 3 times per day and then takes [name], half tablet twice per day and sometimes three times per day.  He does best in the evening and worst in the AM.   No swallowing troubles.  Has  dropped weight but thinks that it is exercise and was trying to follow gluten free diet. He is walking, doing a spinning class for PD, and has started golfing again, which he hasn't done in years.   Still considering DBS therapy.    08/27/14 update:  Patient presents today for followup.  He is currently on [name], 4mg  twice a day.  He is on a strange combination of carbidopa levodopa, but hasn't been able to tolerate the IR version well.[name].  He is currently on 25/100 CR 2-3 times per day and then 50/200 at night.  He rarely takes the IR now; only if playing golf or if he needs a boost during the day.  He is finding that he is having more tremor.  He is also having dreams at night that are making his sleep feel unrestful.  He is strongly  considering deep brain stimulation.  He asks me about details regarding this today.  He has asks me about potentially getting a refill on his Ativan.  He apparently got this filled from an outside physician about a year and a half ago and only had 10 pills in total and is just now running out.  He takes one pill every 3 or 4 months if he has to go to a function with his wife.  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  He asks me if he can have a refill today.  09/10/14 update:  The patient is seen today unexpectedly.  Last visit, I started [name] 145 mg, 2 tablets 3 times per day.  He is also on [name] 4 mg twice a day.  He called me initially to tell me that the [name] was working great and he felt much better on the medication.  In fact, he states that his PT and OT reported that he looked much better and his eval scores were markedly improved and they asked him what he had done differently.  He states last week was just a great week.  On Saturday, he was just a little lightheaded and his wife checked his orthostatics and he states that they were negative.  This week, however, he began to report some abnormal movements.  The movements were primarily of the left shoulder and in the abdomen. I wanted to see him on this medication before added or changed anything, as I have never seen him with dyskinesia.  Unfortunately, this morning he only took one of his tablets, but he did take 2 tablets at lunch time about 1 hour before coming.  He has not noticed the movements today.  He has noticed much less tremor overall since starting [name].  11/25/14 update:  The patient returns today for follow-up.  He is on [name] 4 mg twice a day and [name] 145 mg, 2 tablets 3 times per day.  His biggest issue is tremor, but states that its under good control today (but he just exercised).  He has developed tendinitis in the elbow.  He has clonazepam for REM behavior  disorder but admits he doesn't use it.  The patient remains very active in our Parkinson's exercise classes.  No falls.  Some days he does have to "fight" the posture and the "shuffling."  He didn't ever get to see Dr. Venetia MaxonStern as he states that his life got hectic moving his mother in law.    02/20/15 update:  The patient returns today, accompanied by his wife  who supplements the history.  Last visit, I increased his [name] to 195 mg, and he is taking 2 tablets 3 times a day.  He is also on [name] 4 mg twice a day.  He reports that he has not been doing as well because stress levels have been increased.  His mother-in-law, who is living with them, died unexpectedly.  Because of that, he has had more trouble sleeping as well.  He is still faithfully attending exercise classes and is involved in the Parkinson's community.  He has noticed more rigidity and tremor.  He is taking melatonin, 5 Mill grams at night to help him sleep but does not want to use clonazepam, even though he has at home.  05/24/25 update:  The patient returns today for follow-up.  His wife accompanies him and supplements the history.  He is on [name] 4 mg twice a day and [name] 195 mg, 2 tablets 3 times per day.  He has not wanted to increase the dose, although he likely could use more levodopa. States that he is more tremulous right now and attributes that to the fact that he has had a GI illness recently, which made sx's worse.  He has had emesis (one episode).  No diarrhea.  No fevers.    He has not had any falls.  He denies any hallucinations.  No confusion.  No lightheadedness or near syncope.  He continues to exercise faithfully.  06/12/15 update:  The patient is following up today, accompanied by his wife who supplements the history.  When I saw him last on 05/22/15, he was obviously underdosed and I expressed the need for more levodopa but the pt didn't want to increase it as he felt that his viral gastroenteritis was just causing his  worsening sx's.  He didn't tell me then that he had been off of his meds for 3 days and that is the reason he didn't look well.  They called me on 06/10/2015 however to state that he was doing much worse.  They wanted me to work him in that day, but I was not able to.  I didn't realize that he had been off all his PD meds since June 1 and had started receiving care from a natureopath in [place], Dr. Beckie Busing.  He was given information on Dr. Beckie Busing by his dentist Dr. Mellody Dance, who told him he should try amino acid therapy.  He researched and started seeing Dr. Beckie Busing and was told he needed to be off all his PD meds.  He started on D5-mucuna which markets itself as a "safe, natural" levodopa therapy.  He was told that he needed 4-6 months to see a response, but admits that he was told to tell his doctor what he was doing.  He was increasing the dose of this supplement based on urine dopamine levels which they were told were low.  They apparently were also measuring urine serotonin levels.  Not surprisingly, the patient decompensated.  He states that initially he didn't "feel that bad" but the "tremor was awful."  He also states that he had terrible vivid dreams and ultimately became exhausted from not sleeping, shaking and increased rigidity. It got so bad that he ended up in the ER 2 days ago.    He is back on rytary 195, 2 po tid.  He is a little quesy since being back on it.  He has only been on it for 2 days.  He states that he  talked with the natureopath and she told him to just "start back on it."  He is off of the D5-mucuna.  He and his wife bring in several articles for me to read today.    09/29/15 update:  The patient is following up today, accompanied by his wife who supplements the history.  He is on [name] 4 mg twice a day.  He reports that he is now taking  [name] 195 mg, 2 tablets 3 times a day along with [name] 145 mg, one tablet 3 times a day.  He spreads it out so that he takes it at 8 AM/3 PM/11 PM.   He does not necessarily halfway he describes as "traditional wearing off" but states that at the end of the dose he just does not feel good.  Overall, the patient states that he has been doing okay but he also feels that things are more unpredictable.  He feels more tired and thinks that he has mild dyskinesia.  When he eats, he is biting his lip in the same place.   He denies any lightheadedness or near syncope.  He denies falls.  When asked about depression, he states that he thinks "depression is a strong word" but admits that he gets frustrated and overwhelmed about the disease but uses exercise as a release.  He has sent me paperwork since last visit for his disability which has been completed.  He asks me about writing another letter regarding his VA disability as well.  12/30/15 update:  The patient is following up today.  Last visit, I increased his [name] to 6 mg twice a day.  His [name] has been slightly increased, so that he is on 195 mg, 3 tablets 3 times per day and he added an additional 1 tablet at night because he was awakening at 4 AM with tremor. He has changed that again, but taking the same overall dose of rytary, but is now taking 195, 3 in the AM, 2 in the afternoon, 3 in the evening and then takes 2 at bedtime.   Overall, he thinks he feels that it is a little inconsistent; sometimes he does well and sometimes he doesn't.  He continues to faithfully exercise.  No falls.  No hallucinations.  No syncope but has had some lightheadedness; he has had a uri and isn't sure if it is related to that/meds for that.  Mood has been good.  Sleep has been better since he has been back on his medications.  Appetite has been good.  No new medical issues.  He is still going to integrative therapies and thinks that it is helpful.  However, he did some therapy called alpha stim and was told that it changed the alpha waves in the brain and he didn't think that it was helpful and thinks that it made him feel  strange.  Feels that he has minor dyskinesia, especially of the arm when he walks or bends over.  Asks about DBS therapy and thinks he is going to consider that more this year.    03/03/16 update:  The patient is following up today.  He is on [name], 6 mg twice a day and [name] 195 mg on the following schedule: 3/3/3/2.  This is a little higher than last visit (prior 3/2/3/2).  He denies any falls since last visit.  He denies lightheadedness or near syncope.  No hallucinations.  Tremor has been pretty good, but it will fluctuate throughout the day (  states that he did 2 workouts today).  He does occasionally feel like he has some dyskinesia.  He saw Dr. [name] on February 6 and March 13 and I reviewed those records.  OMT was performed.  The patient feels that that was helpful for his musculoskeletal complaints.  He is doing lots of exercises.  Mood is stable.  Asks about meeting with Dr. Venetia Maxon for DBS but wife not ready to pursue.    06/24/16 update:  The patient is following up today, accompanied by his wife who supplements the history.    He is here for her levodopa challenge test.  He saw Dr. Venetia Maxon in May and is interested in DBS therapy.  Last visit, I increased his [name] so that he is taking 4 mg, 2 tablets twice a day.  He remains on [name] 195 mg on the following schedule: 3/3/3/2.  He takes clonazepam as needed for REM behavior disorder.  He has not had falls since last visit.  He has seen Dr. [name] several times and I reviewed those records.  He has not had any lightheadedness or near syncope.  He continues to exercise faithfully.  Neuroimaging has previously been performed.  It is not available for my review today.  I did ask the patient to get me.  PREVIOUS MEDICATIONS: Requip and neupro (tried few days only); propranolol helped tremor some  ALLERGIES:  No Known Allergies  CURRENT MEDICATIONS:     Medication List       This list is accurate as of: 06/24/16  3:02 PM.  Always use  your most recent med list.               Carbidopa-Levodopa ER 48.75-195 MG Cpcr  Commonly known as:  [name]  Take 1 tablet by mouth daily. 3 tablets at 8 am, 3 tablet at 1 pm, 3 tablets at 6 pm, 2 tablets at bedtime     COQ10 PO  Take 100 mg by mouth daily.     D-5000 5000 units Tabs  Generic drug:  Cholecalciferol  Take 1 tablet by mouth daily.     LECITHIN PO  Take by mouth daily. 3 tblsp     MAGNESIUM PO  Take 800 mg by mouth daily.     MULTI-VITAMINS Tabs  Take 1 tablet by mouth daily.     niacin 1000 MG CR tablet  Commonly known as:  NIASPAN  Take 1,000 mg by mouth daily.     rOPINIRole 4 MG 24 hr tablet  Commonly known as:  [name]  Take 2 tablets (8 mg total) by mouth 2 (two) times daily.     VITAMIN C PO  Take 5,000 mg by mouth daily.     vitamin E 400 UNIT capsule  Take 400 Units by mouth daily.         PAST MEDICAL HISTORY:   Past Medical History  Diagnosis Date  . PD (Parkinson's disease) (HCC) 09/10/2013    PAST SURGICAL HISTORY:   Past Surgical History  Procedure Laterality Date  . None      SOCIAL HISTORY:   Social History   Social History  . Marital Status: Married    Spouse Name: N/A  . Number of Children: N/A  . Years of Education: N/A   Occupational History  . Not on file.   Social History Main Topics  . Smoking status: Never Smoker   . Smokeless tobacco: Never Used  . Alcohol Use: Yes  Comment: Occ  . Drug Use: Not on file  . Sexual Activity: Not on file   Other Topics Concern  . Not on file   Social History Narrative    FAMILY HISTORY:   Family Status  Relation Status Death Age  . Mother Deceased 29    Breast Cancer  . Father Alive     Diabetes  . Sister Alive     healthy  . Daughter Alive     2, twins  . Son Alive     ROS: A complete 10 system review of systems was obtained and was unremarkable apart from what is mentioned above.  PHYSICAL EXAMINATION:    VITALS:   There were no vitals  filed for this visit. Wt Readings from Last 3 Encounters:  06/23/16 170 lb (77.111 kg)  05/23/16 169 lb (76.658 kg)  04/25/16 170 lb (77.111 kg)    GEN:  The patient appears stated age and is in NAD. HEENT:  Normocephalic, atraumatic.  The mucous membranes are moist. The superficial temporal arteries are without ropiness or tenderness. CV:  RRR Lungs:  CTAB Neck/HEME:  There are no carotid bruits bilaterally.  Neurological examination:  Orientation: The patient is alert and oriented x3. Fund of knowledge is appropriate.  Recent and remote memory are intact.  Attention and concentration are normal.    Able to name objects and repeat phrases. Cranial nerves: There is good facial symmetry.  There is facial hypomimia.  Pupils are equal round and reactive to light bilaterally. The speech is fluent and clear. Soft palate rises symmetrically and there is no tongue deviation. Hearing is intact to conversational tone. Sensation: Sensation is intact to light touch throughout. Motor: Strength is 5/5 in the bilateral upper and lower extremities.   Shoulder shrug is equal and symmetric.  There is no pronator drift.   Movement examination prior to the administration of levodopa, his [name] motor SCORE was 30: Tone: There is severe rigidity in the RUE and right lower extremity.  There is mild to moderate rigidity in the left upper extremity. Abnormal movements: There is bilateral LE tremor and occ RUE tremor Coordination:  There is decremation with RAM's, seen primarily with toe taps/heel taps today on the right, but it is better.   Gait and Station: The patient has no difficulty arising out of a deep-seated chair without the use of the hands.  He turns en bloc.  He has occasional start hesitation.  He has a negative pull test.  The patient was given 300 mg of levodopa dissolved in ginger ale and reexamined and [name] motor off score was 11.  Following the examination his movement examination was as  follows:    Tone: There is still moderate rigidity in the right upper extremity.  Tone on the left was much improved.  Tone in the right lower extremity was also improved. Abnormal movements: There is still near constant left lower extremity tremor. Coordination:  There is near normal rapid alternating movements. Gait and Station: The patient has no difficulty arising out of a deep-seated chair without the use of the hands.  He has no start hesitation and doesn't turn en bloc. He walks very well  He has a negative pull test.  ASSESSMENT/PLAN:  1. idiopathic Parkinson's disease.  The patient has tremor, bradykinesia, rigidity and mild postural instability.  -continue requip xl to 4mg  - 2 po bid  -Change rytary from 195 to the 245mg  so that he is taking 3/2/3/2  -DBS was  discussed again today at length.  We discussed risks, benefits.  We discussed the fact that this would not give him any greater quality of life than levodopa when it is working its best.  We discussed rates of infection, seizure, bleeding, stroke, intracerebral hemorrhage, etc.  Alternative opinions were offered.  We talked about the fact that this was not a cure for Parkinson's disease and memory change, speech, balance will continue to progress.  We talked about the fact that DBS could worsen speech issues if not placed correctly or if not programmed correctly.  We talked in detail about logistics of the surgery.  He and his wife wanted to discuss this further and will let me know if they would like to proceed with neuropsych testing and preop MRI.  His wife was present today and asked many questions and I answered them to the best of my ability. 2.  Possible depression   -Managing with exercise and thinks that he is doing fairly well.  3.  REM behavior disorder.  -He has clonazepam and uses occasionally. 4.  follow up in 3-4 months.  Much greater than 50% of this visit was spent in counseling with the patient.  Total face to face  time:  60 min, which didn't include the 30 min wait time for levodopa to kick in

## 2016-06-24 NOTE — Patient Instructions (Addendum)
1. Change to [name] 245 mg tablets - 3 tablets in the morning, 2 tablets in the afternoon, 3 tablets in the evening, 2 tablets at bedtime

## 2016-06-28 ENCOUNTER — Telehealth: Payer: Self-pay | Admitting: Neurology

## 2016-06-28 MED ORDER — CARBIDOPA-LEVODOPA ER 48.75-195 MG PO CPCR: 1.0000 | ORAL_CAPSULE | Freq: Once | ORAL | Status: DC

## 2016-06-28 NOTE — Telephone Encounter (Signed)
Ian PurserKenneth [name] 10-19-2061. He would like to talk with you about his medication [name]. He is having some problems with it. His number is [phone]. Thank you

## 2016-06-28 NOTE — Telephone Encounter (Signed)
Patient sent mychart message.

## 2016-06-28 NOTE — Telephone Encounter (Signed)
Ian PurserKenneth [name] 07-05-2061. He was calling regarding his medication [name]. He would like to talk to you

## 2016-06-28 NOTE — Telephone Encounter (Signed)
Tried to call patient with no answer and no way to leave message.  

## 2016-07-01 ENCOUNTER — Ambulatory Visit: Admitting: Neurology

## 2016-07-05 ENCOUNTER — Ambulatory Visit: Admitting: Neurology

## 2016-07-11 ENCOUNTER — Encounter: Payer: Self-pay | Admitting: Neurology

## 2016-07-11 MED ORDER — ROPINIROLE HCL ER 4 MG PO TB24: 8.0000 mg | ORAL_TABLET | Freq: Two times a day (BID) | ORAL | 5 refills | Status: DC

## 2016-07-14 ENCOUNTER — Encounter (HOSPITAL_COMMUNITY): Payer: Self-pay | Admitting: Emergency Medicine

## 2016-07-14 ENCOUNTER — Emergency Department (HOSPITAL_COMMUNITY)

## 2016-07-14 ENCOUNTER — Emergency Department (HOSPITAL_COMMUNITY)
Admission: EM | Admit: 2016-07-14 | Discharge: 2016-07-14 | Disposition: A | Discharge status: Home / Self Care | Attending: Physician Assistant | Admitting: Physician Assistant

## 2016-07-14 DIAGNOSIS — G20 Parkinson's disease: Secondary | ICD-10-CM | POA: Diagnosis not present

## 2016-07-14 DIAGNOSIS — Z79899 Other long term (current) drug therapy: Secondary | ICD-10-CM | POA: Insufficient documentation

## 2016-07-14 DIAGNOSIS — R0781 Pleurodynia: Secondary | ICD-10-CM | POA: Insufficient documentation

## 2016-07-14 LAB — CBC
HCT: 47.4 % (ref 39.0–52.0)
HEMOGLOBIN: 16.1 g/dL (ref 13.0–17.0)
MCH: 30.9 pg (ref 26.0–34.0)
MCHC: 34 g/dL (ref 30.0–36.0)
MCV: 91 fL (ref 78.0–100.0)
Platelets: 259 10*3/uL (ref 150–400)
RBC: 5.21 MIL/uL (ref 4.22–5.81)
RDW: 13.9 % (ref 11.5–15.5)
WBC: 7.2 10*3/uL (ref 4.0–10.5)

## 2016-07-14 LAB — BASIC METABOLIC PANEL
Anion gap: 8 (ref 5–15)
BUN: 21 mg/dL — ABNORMAL HIGH (ref 6–20)
CO2: 24 mmol/L (ref 22–32)
Calcium: 9.7 mg/dL (ref 8.9–10.3)
Chloride: 107 mmol/L (ref 101–111)
Creatinine, Ser: 0.99 mg/dL (ref 0.61–1.24)
GFR calc Af Amer: >60 mL/min (ref >60)
GLUCOSE: 75 mg/dL (ref 65–99)
POTASSIUM: 4.5 mmol/L (ref 3.5–5.1)
Sodium: 139 mmol/L (ref 135–145)

## 2016-07-14 LAB — I-STAT TROPONIN, ED: TROPONIN I, POC: 0 ng/mL (ref 0.00–0.08)

## 2016-07-14 MED ORDER — OXYCODONE-ACETAMINOPHEN 5-325 MG PO TABS: 1.0000 | ORAL_TABLET | ORAL | 0 refills | Status: DC | PRN

## 2016-07-14 MED ORDER — OXYCODONE-ACETAMINOPHEN 5-325 MG PO TABS
1.0000 | ORAL_TABLET | Freq: Once | ORAL | Status: AC
  Administered 2016-07-14: 1 via ORAL
  Filled 2016-07-14: qty 1

## 2016-07-14 MED ORDER — IBUPROFEN 400 MG PO TABS: 400.0000 mg | ORAL_TABLET | Freq: Four times a day (QID) | ORAL | 0 refills | Status: DC | PRN

## 2016-07-14 MED ORDER — KETOROLAC TROMETHAMINE 60 MG/2ML IM SOLN
60.0000 mg | Freq: Once | INTRAMUSCULAR | Status: AC
  Administered 2016-07-14: 60 mg via INTRAMUSCULAR
  Filled 2016-07-14: qty 2

## 2016-07-14 NOTE — ED Notes (Signed)
Patient took all belongings with him

## 2016-07-14 NOTE — Discharge Instructions (Signed)
It is possible that you may have fractured or dislocated rib. Try ice packs. Take ibuprofen 400 mg and Tylenol 500 mg every 6 hours. Take Percocet for severe pain only. Use incentive spirometer every few hours. Follow-up with family doctor for recheck. Return if any worsening symptoms.

## 2016-07-14 NOTE — ED Triage Notes (Signed)
Pt reports L anterior lower rib pain that began after shoveling earlier today. Felt at pop. Pain lessened with lying back.

## 2016-07-14 NOTE — ED Provider Notes (Signed)
[facility]-EMERGENCY DEPT Provider Note   CSN: [id number] Arrival date & time: 07/14/16  1648  First Provider Contact:  None       History   Chief Complaint Chief Complaint  Patient presents with  . Chest Pain    HPI Ian [name] is a 55 y.o. male.  Ian [name] is a 55 y.o. Male with history of Parkinson's disease and slipped rib in the past, who presents to emergency department complaining of left rib pain. Patient states he was shoveling dry cement when he felt sharp pain in the left lower rib cage. He states he felt a pop in that area. He states since then he has had sharp pain, worsened with movement and sitting down. He denies any shortness of breath. He denies any abdominal pain. No numbness or weakness in extremities. He took 2 extra strength Tylenol prior to coming in. He states laying down still on his back makes symptoms better. Pain does not radiate. He reports history of dislocated rib, but states it was higher, and states he had to have a sports medicine doctor manipulated back in.       Past Medical History:  Diagnosis Date  . PD (Parkinson's disease) (HCC) 09/10/2013    Patient Active Problem List   Diagnosis Date Noted  . PD (Parkinson's disease) (HCC) 06/24/2016  . 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 Nonallopathic lesion of thoracic region 01/25/2016  . Nonallopathic lesion-rib cage 01/25/2016  . Nonallopathic lesion of cervical region 01/25/2016  . Parkinson's disease (HCC) 01/25/2016  . Lateral epicondylitis 12/01/2014  . REM behavioral disorder 08/27/2014  . Paralysis agitans (HCC) 10/18/2013    Past Surgical History:  Procedure Laterality Date  . none         Home Medications    Prior to Admission medications   Medication Sig Start Date End Date Taking? Authorizing Provider  Ascorbic Acid (VITAMIN C PO) Take 5,000 mg by mouth daily.    Yes Historical Provider, MD  Carbidopa-Levodopa ER ([name]) 48.75-195 MG CPCR 1 tablet by Does not apply  route once. Take 4 tablets in the morning then, 3 tablets TID daily Patient taking differently: Take 3-4 tablets by mouth See admin instructions. Take 4 tablets in the morning then, 3 tablets three times daily 06/28/16  Yes [name] S [name], DO  Cholecalciferol (D-5000) 5000 UNITS TABS Take 1 tablet by mouth daily.    Yes Historical Provider, MD  Coenzyme Q10 (COQ10 PO) Take 100 mg by mouth daily.   Yes Historical Provider, MD  LECITHIN PO Take 1 capsule by mouth daily.    Yes Historical Provider, MD  MAGNESIUM PO Take 800 mg by mouth daily.    Yes Historical Provider, MD  Multiple Vitamin (MULTI-VITAMINS) TABS Take 1 tablet by mouth daily.    Yes Historical Provider, MD  niacin (NIASPAN) 1000 MG CR tablet Take 1,000 mg by mouth daily.   Yes Historical Provider, MD  rOPINIRole ([name]) 4 MG 24 hr tablet Take 2 tablets (8 mg total) by mouth 2 (two) times daily. 07/11/16  Yes [name] S [name], DO  vitamin E 400 UNIT capsule Take 400 Units by mouth daily.    Yes Historical Provider, MD  ibuprofen (ADVIL,MOTRIN) 400 MG tablet Take 1 tablet (400 mg total) by mouth every 6 (six) hours as needed. 07/14/16   Laytoya Ion, PA-C  oxyCODONE-acetaminophen (ROXICET) 5-325 MG tablet Take 1 tablet by mouth every 4 (four) hours as needed for severe pain. 07/14/16   Jaynie Crumble, PA-C  Family History Family History  Problem Relation Age of Onset  . Diabetes Other   . Cancer Other     Social History Social History  Substance Use Topics  . Smoking status: Never Smoker  . Smokeless tobacco: Never Used  . Alcohol use Yes     Comment: Occ     Allergies   Review of patient's allergies indicates no known allergies.   Review of Systems Review of Systems  Constitutional: Negative for chills and fever.  Respiratory: Negative for cough, chest tightness and shortness of breath.   Cardiovascular: Positive for chest pain. Negative for palpitations and leg swelling.  Gastrointestinal: Negative for  abdominal distention, abdominal pain, diarrhea, nausea and vomiting.  Musculoskeletal: Positive for arthralgias. Negative for neck pain and neck stiffness.  Skin: Negative for rash.  Allergic/Immunologic: Negative for immunocompromised state.  Neurological: Negative for dizziness, weakness, light-headedness, numbness and headaches.  All other systems reviewed and are negative.    Physical Exam Updated Vital Signs BP 108/68 (BP Location: Left Arm)   Pulse 63   Temp 97.9 F (36.6 C) (Oral)   Resp 22   SpO2 100%   Physical Exam  Constitutional: He appears well-developed and well-nourished. No distress.  HENT:  Head: Normocephalic.  Neck: Normal range of motion. Neck supple.  Cardiovascular: Normal rate, regular rhythm and normal heart sounds.   Pulmonary/Chest: Effort normal and breath sounds normal. No respiratory distress. He has no wheezes. He has no rales. He exhibits tenderness.  Tenderness to palpation over left lower anterior ribs, specifically over rib 9/10. No deformity or swelling.   Abdominal: Bowel sounds are normal. There is no tenderness.  No tenderness of her abdominal wall  Skin: Skin is warm and dry.  Nursing note and vitals reviewed.    ED Treatments / Results  Labs (all labs ordered are listed, but only abnormal results are displayed) Labs Reviewed  BASIC METABOLIC PANEL - Abnormal; Notable for the following:       Result Value   BUN 21 (*)    All other components within normal limits  CBC  I-STAT TROPOININ, ED    EKG  EKG Interpretation  Date/Time:  Thursday July 14 2016 17:41:35 EDT Ventricular Rate:  68 PR Interval:    QRS Duration: 85 QT Interval:  357 QTC Calculation: 380 R Axis:   80 Text Interpretation:  Sinus rhythm Inferior infarct, acute Anterior infarct, acute (LAD) Lateral leads are also involved No significant change since last tracing Confirmed by Kandis Mannan ([id number]) on 07/14/2016 5:44:44 PM       Radiology Dg Chest 2  View  Result Date: 07/14/2016 CLINICAL DATA:  Left anterior rib pain following shoveling EXAM: CHEST  2 VIEW COMPARISON:  None. FINDINGS: Cardiac shadow is within normal limits. The lungs are well aerated bilaterally. No focal infiltrate or sizable effusion is seen. No acute bony abnormality is noted. IMPRESSION: No active cardiopulmonary disease. Electronically Signed   By: Alcide Clever M.D.   On: 07/14/2016 17:45   Procedures Procedures (including critical care time)  Medications Ordered in ED Medications  oxyCODONE-acetaminophen (PERCOCET/ROXICET) 5-325 MG per tablet 1 tablet (not administered)  ketorolac (TORADOL) injection 60 mg (60 mg Intramuscular Given 07/14/16 2010)     Initial Impression / Assessment and Plan / ED Course  I have reviewed the triage vital signs and the nursing notes.  Pertinent labs & imaging results that were available during my care of the patient were reviewed by me and considered in my medical  decision making (see chart for details).  Clinical Course  Patient to the emergency department complaining of left-sided lower rib pain after shoveling heavy dry cement tonight. He felt a pop in the left lower rib cage. Chest x-ray showed no obvious rib deformity. He has no tenderness over her abdominal wall. He is neurovascularly intact. He has history of rib dislocation, however given location of the pain, doubt the rib is dislocated. Question occult fracture versus muscular injury. Dr. Corlis Leak performed bedside US, but unable to definitely see the fracture. Pt received toradol  IM. He feels better. Able to ambulate with no difficulty. Labs all obtained at triage and unremarkable. Stable to dc home wit incentive spirometer, ibuprofen, tylenol, percocet for severe pain only, follow up with pcp.    Final Clinical Impressions(s) / ED Diagnoses   Final diagnoses:  Rib pain on left side    New Prescriptions New Prescriptions   IBUPROFEN (ADVIL,MOTRIN) 400 MG TABLET     Take 1 tablet (400 mg total) by mouth every 6 (six) hours as needed.   OXYCODONE-ACETAMINOPHEN (ROXICET) 5-325 MG TABLET    Take 1 tablet by mouth every 4 (four) hours as needed for severe pain.     Jaynie Crumble, PA-C 07/14/16 2133    Courteney Randall An, MD 07/14/16 2345

## 2016-07-18 ENCOUNTER — Other Ambulatory Visit: Payer: Self-pay | Admitting: Ophthalmology

## 2016-07-18 NOTE — Progress Notes (Signed)
[name] [address] [place], [place] [address] Phone: [phone] Subjective:    I'm seeing this patient by the request  of:  [name],[name], MD   BJ:YNWGNFA rib syndrome follow-up  OZH:YQMVHQIONG  Ian [name] is a 55 y.o. male coming in with complaint of right-sided back pain. 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 Had an exacerbation recently where he had to go to the emergency department due to the right side pain. He did have a chest x-ray at that time. This is been independently visualized by me with no significant bony abnormality. Patient states continues to have pain that seems to be improving. Patient states that it seems to be fairly localized with some mild radiation around. Denies any bowel or bladder, difficulty. Patient states though that if he takes a deep breath returns relating continues to have pain. More of an aching sensation that sure stabbing pain when it was previously. Wanting to know if a rib is out of place.      Past Medical History:  Diagnosis Date  . PD (Parkinson's disease) (HCC) 09/10/2013   Past Surgical History:  Procedure Laterality Date  . none     Social History   Social History  . Marital status: Married    Spouse name: N/A  . Number of children: N/A  . Years of education: N/A   Social History Main Topics  . Smoking status: Never Smoker  . Smokeless tobacco: Never Used  . Alcohol use Yes     Comment: Occ  . Drug use: Unknown  . Sexual activity: Not on file   Other Topics Concern  . Not on file   Social History Narrative  . No narrative on file   No Known Allergies Family History  Problem Relation Age of Onset  . Diabetes Other   . Cancer Other     Past medical history, social, surgical and family history all reviewed in electronic medical record.  No pertanent information unless stated regarding to the chief complaint.   Review of Systems: No headache, visual  changes, nausea, vomiting, diarrhea, constipation, dizziness, abdominal pain, skin rash, fevers, chills, night sweats, weight loss, swollen lymph nodes, chest pain, shortness of breath, mood changes.   Objective  There were no vitals taken for this visit.  General: No apparent distress alert and oriented x3 mood and affect normal, dressed appropriately. Very mild masked facies HEENT: Pupils equal, extraocular movements intact  Respiratory: Patient's speak in full sentences and does not appear short of breath  Cardiovascular: No lower extremity edema, non tender, no erythema  Skin: Warm dry intact with no signs of infection or rash on extremities or on axial skeleton.  Abdomen: Soft nontender  Neuro: Cranial nerves II through XII are intact, neurovascularly intact in all extremities with 2+ DTRs and 2+ pulses.  Lymph: No lymphadenopathy of posterior or anterior cervical chain or axillae bilaterally.  Gait normal with good balance and coordination.  MSK:  Non tender with full range of motion and good stability and symmetric strength and tone of shoulders, elbows, wrist, hip, knee and ankles bilaterally. Mild rigidity with movement mostly on the right side. Increasing resting tremor of the lower extremity. Back Exam:  Inspection: Unremarkable  Motion: Flexion 35 deg, Extension 20 deg  Side Bending to 35 deg bilaterally,  Rotation to 35 deg bilaterally more cogwheeling with range of motion SLR laying: Negative  XSLR laying: Negative   Palpable tenderness:  tender between the eighth and ninth ribs on the left side anteriorly. No pain over the ribs themselves. Worsening pain with deep inspiration no bruising noted of the overlying skin FABER: negative. Sensory change: Gross sensation intact to all lumbar and sacral dermatomes.  Reflexes: 2+ at both patellar tendons, 2+ at achilles tendons, Babinski's downgoing.  Strength at foot  Plantar-flexion: 5/5 Dorsi-flexion: 5/5 Eversion: 5/5 Inversion: 5/5    Leg strength   Quad: 5/5 Hamstring: 5/5 Hip flexor: 5/5 Hip abductors: 4/5   Osteopathic findings C2 flexed rotated and side bent right t    Impression and Recommendations:     This case required medical decision making of moderate complexity.      Note: This dictation was prepared with Dragon dictation along with smaller phrase technology. Any transcriptional errors that result from this process are unintentional.

## 2016-07-19 ENCOUNTER — Ambulatory Visit (INDEPENDENT_AMBULATORY_CARE_PROVIDER_SITE_OTHER): Admitting: Family Medicine

## 2016-07-19 ENCOUNTER — Encounter: Payer: Self-pay | Admitting: Family Medicine

## 2016-07-19 DIAGNOSIS — M9901 Segmental and somatic dysfunction of cervical region: Secondary | ICD-10-CM

## 2016-07-19 DIAGNOSIS — S29019A Strain of muscle and tendon of unspecified wall of thorax, initial encounter: Secondary | ICD-10-CM | POA: Insufficient documentation

## 2016-07-19 DIAGNOSIS — M999 Biomechanical lesion, unspecified: Secondary | ICD-10-CM

## 2016-07-19 NOTE — Patient Instructions (Addendum)
Good to see you  Ice still can help  Baclofen up to 3 times a day do definitely at night for 2-3 nights pennsaid pinkie amount topically 2 times daily as needed.  Looks to be a muscle tear but should be gone in 2 weeks.  See me again in 2-3 weeks.

## 2016-07-19 NOTE — Assessment & Plan Note (Addendum)
I believe the patient does have more of an intercostal rib injury. No crepitus noted. Patient is tender between the ribs and on top on themselves. Discussed topical anti-inflammatories as well as a muscle relaxer. Patient states that he has some at home. Has pain medications if necessary. Not taking them regularly. We discussed possible bracing. Swelling down on activities. Follow-up again in 2-3 weeks.

## 2016-07-19 NOTE — Assessment & Plan Note (Addendum)
Decision today to treat with OMT was based on Physical Exam  After verbal consent patient was treated with HVLA, ME, FPR techniques in  cervical areas only avoiding due to recent injury.  Patient tolerated the procedure well with improvement in symptoms  Patient given exercises, stretches and lifestyle modifications  See medications in patient instructions if given  Patient will follow up in 4-8 weeks

## 2016-07-21 ENCOUNTER — Ambulatory Visit: Admitting: Family Medicine

## 2016-08-06 NOTE — Progress Notes (Signed)
[name] D[facility] [address] [place], [place] [address] Phone: [phone] Subjective:    I'm seeing this patient by the request  of:  [name],[name], MD   BJ:YNWGNFACC:slipped rib syndrome follow-up  OZH:YQMVHQIONGHPI:Subjective  Ian PurserKenneth Neitzke is a 55 y.o. male coming in with complaint of right-sided back pain. 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 Had injury to intercostal muscle at last exam.  Patient was to decrease activity, patient states Overall he is doing better. As well as some mild discomfort on the sign but overall seems to be doing better. Patient states that the muscle seems to be improving. He then started lifting some lightweights. No new symptoms.     Past Medical History:  Diagnosis Date  . PD (Parkinson's disease) (HCC) 09/10/2013   Past Surgical History:  Procedure Laterality Date  . none     Social History   Social History  . Marital status: Married    Spouse name: N/A  . Number of children: N/A  . Years of education: N/A   Social History Main Topics  . Smoking status: Never Smoker  . Smokeless tobacco: Never Used  . Alcohol use Yes     Comment: Occ  . Drug use: Unknown  . Sexual activity: Not on file   Other Topics Concern  . Not on file   Social History Narrative  . No narrative on file   No Known Allergies Family History  Problem Relation Age of Onset  . Diabetes Other   . Cancer Other     Past medical history, social, surgical and family history all reviewed in electronic medical record.  No pertanent information unless stated regarding to the chief complaint.   Review of Systems: No headache, visual changes, nausea, vomiting, diarrhea, constipation, dizziness, abdominal pain, skin rash, fevers, chills, night sweats, weight loss, swollen lymph nodes, chest pain, shortness of breath, mood changes.   Objective  There were no vitals taken for this visit.  General: No apparent distress alert and oriented x3 mood  and affect normal, dressed appropriately. Very mild masked facies HEENT: Pupils equal, extraocular movements intact  Respiratory: Patient's speak in full sentences  Cardiovascular: No lower extremity edema, non tender, no erythema  Skin: Warm dry intact with no signs of infection or rash on extremities or on axial skeleton.  Abdomen: Soft nontender  Neuro: Cranial nerves II through XII are intact, neurovascularly intact in all extremities with 2+ DTRs and 2+ pulses.  Lymph: No lymphadenopathy of posterior or anterior cervical chain or axillae bilaterally.  Gait normal with good balance and coordination.  MSK:  Non tender with full range of motion and good stability and symmetric strength and tone of shoulders, elbows, wrist, hip, knee and ankles bilaterally. Mild rigidity with movement mostly on the right side. Continue tremor of the right lower extremity Back Exam:  Inspection: Unremarkable  Motion: Flexion 35 deg, Extension 20 deg  Side Bending to 35 deg bilaterally,  Rotation to 35 deg bilaterally more cogwheeling with range of motion SLR laying: Negative  XSLR laying: Negative   Palpable tenderness: Less tenderness to palpation over the intercostal muscles than previous exam. FABER: negative. Sensory change: Gross sensation intact to all lumbar and sacral dermatomes.  Reflexes: 2+ at both patellar tendons, 2+ at achilles tendons, Babinski's downgoing.  Strength at foot  Plantar-flexion: 5/5 Dorsi-flexion: 5/5 Eversion: 5/5 Inversion: 5/5  Leg strength   Quad: 5/5 Hamstring: 5/5 Hip flexor: 5/5 Hip  abductors: 4/5   Osteopathic findings C2 flexed rotated and side bent right T3 extended rotated and side bent right T7 extended rotated and side bent left L2 flexed rotated and side bent right    Impression and Recommendations:     This case required medical decision making of moderate complexity.      Note: This dictation was prepared with Dragon dictation along with smaller  phrase technology. Any transcriptional errors that result from this process are unintentional.

## 2016-08-08 ENCOUNTER — Encounter: Payer: Self-pay | Admitting: Family Medicine

## 2016-08-08 ENCOUNTER — Ambulatory Visit (INDEPENDENT_AMBULATORY_CARE_PROVIDER_SITE_OTHER): Admitting: Family Medicine

## 2016-08-08 VITALS — BP 118/82 | HR 68 | Wt 167.0 lb

## 2016-08-08 DIAGNOSIS — M9902 Segmental and somatic dysfunction of thoracic region: Secondary | ICD-10-CM | POA: Diagnosis not present

## 2016-08-08 DIAGNOSIS — G20 Parkinson's disease: Secondary | ICD-10-CM

## 2016-08-08 DIAGNOSIS — M999 Biomechanical lesion, unspecified: Secondary | ICD-10-CM

## 2016-08-08 DIAGNOSIS — S29019D Strain of muscle and tendon of unspecified wall of thorax, subsequent encounter: Secondary | ICD-10-CM

## 2016-08-08 DIAGNOSIS — M9901 Segmental and somatic dysfunction of cervical region: Secondary | ICD-10-CM

## 2016-08-08 DIAGNOSIS — M9908 Segmental and somatic dysfunction of rib cage: Secondary | ICD-10-CM

## 2016-08-08 NOTE — Assessment & Plan Note (Signed)
Continue to respond well to conservative therapy. Continues is seen neurology on a regular basis. Some of the tightness in the muscle is contribute into the asymmetry that is necessary for osteopathic manipulation but is responding well.

## 2016-08-08 NOTE — Assessment & Plan Note (Signed)
Decision today to treat with OMT was based on Physical Exam  After verbal consent patient was treated with HVLA, ME, FPR techniques in  cervical areas only avoiding due to recent injury.  Patient tolerated the procedure well with improvement in symptoms  Patient given exercises, stretches and lifestyle modifications  See medications in patient instructions if given  Patient will follow up in 3-4 weeks

## 2016-08-08 NOTE — Patient Instructions (Signed)
reat to see you  I am glad you are doing better Careful with rotation  See me again in 3-4 weeks.

## 2016-08-08 NOTE — Assessment & Plan Note (Signed)
Seems to be doing well. Icing, home exercises, core strengthening but still avoid significant twisting. Patient will come back and see me again in 3-4 weeks.

## 2016-08-28 NOTE — Assessment & Plan Note (Signed)
Once again believe the patient's Parkinson's and increased muscle tone is likely contribute into a lot of his discomfort and some of his musculoskeletal pain. Patient wants to avoid taking pain medications on a regular basis. We discussed different treatment options and possibly discussing with neurologist to see if there is any other medications a could be poor beneficial for his Parkinson's. Seems to have had some mild progression of disease recently. We discussed icing regimen, home exercises, we discussed core strengthening exercises and instability. Patient will continue to be active. We'll need to see me every 3-6 weeks for further evaluation and treatment.

## 2016-08-28 NOTE — Assessment & Plan Note (Signed)
Decision today to treat with OMT was based on Physical Exam  After verbal consent patient was treated with HVLA, ME, FPR techniques in  Cervical, thoracic, rib lumbar Patient tolerated the procedure well with improvement in symptoms  Patient given exercises, stretches and lifestyle modifications  See medications in patient instructions if given  Patient will follow up in 3-4 weeks            

## 2016-08-28 NOTE — Progress Notes (Signed)
Ian ScaleZach [name] D.O. [facility] [name] [place], [place] [address] Phone: [phone] Subjective:     ZD:GUYQIHKCC:slipped rib syndrome follow-up  VQQ:VZDGLOVFIEHPI:Subjective  Ian PurserKenneth [name] is a 55 y.o. male coming in with complaint of right-sided back pain. 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 Patient was having worsening symptoms after an injury which show. Patient seems to be doing better though. Patient states Continues to make progress. Was able to play 9 holes of golf last week. Has started work out. States that if he has any pain he backs off but overall seems to be doing very well.    Past Medical History:  Diagnosis Date  . PD (Parkinson's disease) (HCC) 09/10/2013   Past Surgical History:  Procedure Laterality Date  . none     Social History   Social History  . Marital status: Married    Spouse name: N/A  . Number of children: N/A  . Years of education: N/A   Social History Main Topics  . Smoking status: Never Smoker  . Smokeless tobacco: Never Used  . Alcohol use Yes     Comment: Occ  . Drug use: Unknown  . Sexual activity: Not Asked   Other Topics Concern  . None   Social History Narrative  . None   No Known Allergies Family History  Problem Relation Age of Onset  . Diabetes Other   . Cancer Other     Past medical history, social, surgical and family history all reviewed in electronic medical record.  No pertanent information unless stated regarding to the chief complaint.   Review of Systems: No headache, visual changes, nausea, vomiting, diarrhea, constipation, dizziness, abdominal pain, skin rash, fevers, chills, night sweats, weight loss, swollen lymph nodes, chest pain, shortness of breath, mood changes.   Objective  Blood pressure 118/70, pulse 64, weight 169 lb (76.7 kg), SpO2 99 %.  General: No apparent distress alert and oriented x3 mood and affect normal, dressed appropriately. Very mild masked facies HEENT: Pupils  equal, extraocular movements intact  Respiratory: Patient's speak in full sentences  Cardiovascular: No lower extremity edema, non tender, no erythema  Skin: Warm dry intact with no signs of infection or rash on extremities or on axial skeleton.  Abdomen: Soft nontender  Neuro: Cranial nerves II through XII are intact, neurovascularly intact in all extremities with 2+ DTRs and 2+ pulses.  Lymph: No lymphadenopathy of posterior or anterior cervical chain or axillae bilaterally.  Gait normal with good balance and coordination.  MSK:  Non tender with full range of motion and good stability and symmetric strength and tone of shoulders, elbows, wrist, hip, knee and ankles bilaterally. Mild rigidity with movement mostly on the right side. Continue tremor of the right lower extremity the worsening Back Exam:  Inspection: Unremarkable  Motion: Flexion 35 deg, Extension 20 deg  Side Bending to 35 deg bilaterally,  Rotation to 35 deg bilaterally cogwheeling with range of motion SLR laying: Negative  XSLR laying: Negative   Palpable tenderness: Minimal tenderness over the paraspinal musculature on the left sign but otherwise seems to be doing much better FABER: negative. Sensory change: Gross sensation intact to all lumbar and sacral dermatomes.  Reflexes: 2+ at both patellar tendons, 2+ at achilles tendons, Babinski's downgoing.  Strength at foot  Plantar-flexion: 5/5 Dorsi-flexion: 5/5 Eversion: 5/5 Inversion: 5/5  Leg strength   Quad: 5/5 Hamstring: 5/5 Hip flexor: 5/5 Hip abductors: 4+/5   Osteopathic findings C2 flexed  rotated and side bent right T3 extended rotated and side bent right inhaled third rib T11 extended rotated and side bent left L2 flexed rotated and side bent right    Impression and Recommendations:     This case required medical decision making of moderate complexity.      Note: This dictation was prepared with Dragon dictation along with smaller phrase technology.  Any transcriptional errors that result from this process are unintentional.

## 2016-08-29 ENCOUNTER — Encounter: Payer: Self-pay | Admitting: Family Medicine

## 2016-08-29 ENCOUNTER — Encounter: Payer: Self-pay | Admitting: Neurology

## 2016-08-29 ENCOUNTER — Ambulatory Visit (INDEPENDENT_AMBULATORY_CARE_PROVIDER_SITE_OTHER): Admitting: Family Medicine

## 2016-08-29 VITALS — BP 118/70 | HR 64 | Wt 169.0 lb

## 2016-08-29 DIAGNOSIS — M9902 Segmental and somatic dysfunction of thoracic region: Secondary | ICD-10-CM | POA: Diagnosis not present

## 2016-08-29 DIAGNOSIS — M940 Chondrocostal junction syndrome [Tietze]: Secondary | ICD-10-CM | POA: Diagnosis not present

## 2016-08-29 DIAGNOSIS — G20 Parkinson's disease: Secondary | ICD-10-CM | POA: Diagnosis not present

## 2016-08-29 DIAGNOSIS — M9901 Segmental and somatic dysfunction of cervical region: Secondary | ICD-10-CM

## 2016-08-29 DIAGNOSIS — M9908 Segmental and somatic dysfunction of rib cage: Secondary | ICD-10-CM

## 2016-08-29 DIAGNOSIS — M999 Biomechanical lesion, unspecified: Secondary | ICD-10-CM

## 2016-08-29 NOTE — Patient Instructions (Signed)
Good to see you  Ice is your friend  You are making progress.  See me again in 4 weeks.

## 2016-08-30 NOTE — Telephone Encounter (Signed)
Ian [name] or [name]   Can we please call this patient to set up evaluation with Dr. Alinda DoomsBailar? Thanks.   Dr. Arbutus Leasat Lorain Childes- FYI   See message from patient. We are setting up neuropsych testing.

## 2016-09-12 ENCOUNTER — Ambulatory Visit (INDEPENDENT_AMBULATORY_CARE_PROVIDER_SITE_OTHER): Payer: Medicare Other | Admitting: Psychology

## 2016-09-12 DIAGNOSIS — G20 Parkinson's disease: Secondary | ICD-10-CM | POA: Diagnosis not present

## 2016-09-12 DIAGNOSIS — F4323 Adjustment disorder with mixed anxiety and depressed mood: Secondary | ICD-10-CM

## 2016-09-12 NOTE — Progress Notes (Signed)
NEUROPSYCHOLOGICAL INTERVIEW (CPT: [id number])  Name: Ian [name] Date of Birth: 1961/08/29 Date of Interview: 09/12/2016  Reason for Referral:  Ian [name] is a 55 y.o., married male who is referred by Dr. [name] [name] to assess his current level of cognitive functioning and assist in differential diagnosis. This was also part of a pre-surgical evaluation for deep brain stimulation (DBS) to assist in the management of Parkinson's disease.   History of Presenting Problem:  Ian [name] is followed by Dr. Arbutus Leasat for idiopathic Parkinson's disease. He has tremor, bradykinesia, rigidity and mild postural instability, per her notes. The patient has had symptoms for at least six years.   At today's visit, the patient denied having concerns about cognitive functioning.    Upon direct questioning, the patient reported the following:   Forgetting recent conversations/events: No Repeating statements/questions: No Misplacing/losing items: No Forgetting appointments or other obligations: No Forgetting to take medications: Miss a dose occasionally, or forgets to bring meds with him (e.g. Doesn't realize he will be gone that long)  Difficulty concentrating: If PD sx are really bad that day, it can affect his concentration, but nothing significant. Wife says he has always been kind of hyper and all over the place. Patient says he thinks he has "a touch of ADHD". No academic issues as a child though.  Processing information more slowly: No  Word-finding difficulty: Minimal (feels it is within normal limits) Comprehension difficulty: No but they do misunderstand each other more (used to just immediately understand each other, takes more explaining sometimes)  Getting lost when driving: No Making wrong turns when driving: No Uncertain about directions when driving or passenger: No Tries to minimize distractions when he drives and be more cautious   Ian [name] has not worked since January 2014. 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 He and his wife reported that he did not have a proper diagnosis or treatment at that time so he was unable to function very well. He was taking propanolol and it was causing problems. He believes he could have worked if he had been properly treated at that time. His plan was to take a break from working while he did further medical workup, and he actually had a few job offers from [employer] organizations, but then he got the diagnosis of PD.   Ian [name] achieves 6-7 hours of sleep a night, but does have a tendency to wake in the middle of the night and have trouble falling back asleep. Whether he has a good or bad night of sleep greatly affects his functioning the next day. He does have vivid dreams and REM sleep behaviors 1-2 nights a week. He was taking clonazepam but weaned himself off this because he did not want to be "addicted" to it.  His appetite is decreased overall, but it has improved slightly lately.   He has not had any hallucinations.    Current Functioning: Ian [name] lives with his wife in their own home. He manages instrumental ADLs, including driving, medications and appointments. He is very physically active and has found this to help his parkinsonian symptoms. He exercises regularly, stays busy with projects in his home and yard, sings in the male choir in his church, maintains a large aquarium, and is involved in the [employer] program in neuro-rehabilitation.   Ian [name] reported that his mood is "pretty good for the most part, considering." His faith helps a lot. He reported that he tries "not to  go down the path" of depression. He and his wife agree that he is more quick-tempered but he is able to catch himself. He does not feel he is depressed but admits he feels melancholy from time to time. He appears to have good coping tools to improve his mood. He does endorse having anxiety and reluctance to do things he used to do,  because he is worried his symptoms of PD will become apparent to others (e.g., public speaking). He tends to stay at home more than he used to. He has not told many people that he has PD. He does not want people to feel sorry for him. He appears somewhat conflicted about whether or not it would be better to just tell everyone he knows that he has PD. He stated that he puts a lot of energy and work into "trying to be normal".  He denied past or present suicidal ideation or intention. No imminent risk of self-harm was identified.  Psychiatric History: Psychiatric history prior to onset of Parkinson's disease was denied. He did see a psychologist for 6-12 months after he began having symptoms but did not yet have a PD diagnosis, to assist with adjustment to symptoms.   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 3 children (23yo son, twin girls age 54) Alcohol/Tobacco/Substances: Very seldom alcohol, never a smoker, no substance abuse  Medical History: Past Medical History:  Diagnosis Date  . PD (Parkinson's disease) (HCC) 09/10/2013     Current Medications:  Outpatient Encounter Prescriptions as of 09/12/2016  Medication Sig  . Ascorbic Acid (VITAMIN C PO) Take 5,000 mg by mouth daily.   . Carbidopa-Levodopa ER ([name]) 48.75-195 MG CPCR 1 tablet by Does not apply route once. Take 4 tablets in the morning then, 3 tablets TID daily (Patient taking differently: Take 3-4 tablets by mouth See admin instructions. Take 4 tablets in the morning then, 3 tablets three times daily)  . Cholecalciferol (D-5000) 5000 UNITS TABS Take 1 tablet by mouth daily.   . Coenzyme Q10 (COQ10 PO) Take 100 mg by mouth daily.  [name] ibuprofen (ADVIL,MOTRIN) 400 MG tablet Take 1  tablet (400 mg total) by mouth every 6 (six) hours as needed.  [name] LECITHIN PO Take 1 capsule by mouth daily.   [name] MAGNESIUM PO Take 800 mg by mouth daily.   . Multiple Vitamin (MULTI-VITAMINS) TABS Take 1 tablet by mouth daily.   . niacin (NIASPAN) 1000 MG CR tablet Take 1,000 mg by mouth daily.  [name] oxyCODONE-acetaminophen (ROXICET) 5-325 MG tablet Take 1 tablet by mouth every 4 (four) hours as needed for severe pain.  [name] rOPINIRole ([name]) 4 MG 24 hr tablet Take 2 tablets (8 mg total) by mouth 2 (two) times daily.  . vitamin E 400 UNIT capsule Take 400 Units by mouth daily.    No facility-administered encounter medications on file as of 09/12/2016.      Behavioral Observations:   Appearance: Neatly and appropriately dressed, thin appearing. Mild tremor in extremities, not continuous.  Gait: Ambulated independently Speech: Fluent; generally normal rate, rhythm and volume Thought process: Linear, goal directed Affect: Mildly reduced facial expression, generally euthymic, some anxiety Interpersonal: Very pleasant, appropriate   TESTING: There is medical necessity to proceed with neuropsychological assessment as the results will be used to aid  in differential diagnosis and clinical decision-making and to inform specific treatment recommendations. Specifically, there is medical necessity to proceed with pre-surgical neuropsychological evaluation to inform whether the patient might safely proceed with a medical or surgical procedure that may affect brain function (i.e., deep brain stimulation).    PLAN: The patient will return for a full battery of neuropsychological testing with a psychometrician under my supervision. Education regarding testing procedures was provided. Subsequently, the patient will see this provider for a follow-up session at which time his test performances and my impressions and treatment recommendations will be reviewed in detail.   Full neuropsychological evaluation  report to follow.

## 2016-09-13 ENCOUNTER — Encounter: Payer: Self-pay | Admitting: Psychology

## 2016-09-22 ENCOUNTER — Ambulatory Visit (INDEPENDENT_AMBULATORY_CARE_PROVIDER_SITE_OTHER): Admitting: Psychology

## 2016-09-22 DIAGNOSIS — G20 Parkinson's disease: Secondary | ICD-10-CM

## 2016-09-22 DIAGNOSIS — F4323 Adjustment disorder with mixed anxiety and depressed mood: Secondary | ICD-10-CM

## 2016-09-22 NOTE — Progress Notes (Signed)
   Neuropsychology Note  Ian PurserKenneth Malenfant returned today for 3 hours of neuropsychological testing with technician, Wallace Kellerana Chamberlain, BS, under the supervision of Dr. Elvis CoilMaryBeth Bailar. The patient did not appear overtly distressed by the testing session, per behavioral observation or via self-report to the technician. Rest breaks were offered. Ian [name] will return within 2 weeks for a feedback session with Dr. Alinda DoomsBailar at which time his test performances, clinical impressions and treatment recommendations will be reviewed in detail. The patient understands he can contact our office should he require our assistance before this time.  Full report to follow.

## 2016-09-23 NOTE — Progress Notes (Signed)
[name] Smith D.O. [name] [address] [place], [place] [address] Phone: [phone] Subjective:     BM:WUXLKGMCC:slipped rib syndrome follow-up  WNU:[id number]:Subjective  Ian PurserKenneth [name] is a 55 y.o. male coming in with complaint of right-sided back pain. 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 Patient was having worsening symptoms after an injury which show. Patient seems to be doing better though. Patient has been feeling very well. Feels that the increase in frequency of the manipulation has been more helpful. Patient states he was able to do 18 holes of golf the other week and felt fairly good. Denies any numbness. An states that he has had some increasing tightness. No new symptoms just worsening of previous symptoms.     Past Medical History:  Diagnosis Date  . PD (Parkinson's disease) (HCC) 09/10/2013   Past Surgical History:  Procedure Laterality Date  . none     Social History   Social History  . Marital status: Married    Spouse name: N/A  . Number of children: N/A  . Years of education: N/A   Social History Main Topics  . Smoking status: Never Smoker  . Smokeless tobacco: Never Used  . Alcohol use Yes     Comment: Occ  . Drug use: Unknown  . Sexual activity: Not Asked   Other Topics Concern  . None   Social History Narrative  . None   No Known Allergies Family History  Problem Relation Age of Onset  . Diabetes Other   . Cancer Other     Past medical history, social, surgical and family history all reviewed in electronic medical record.  No pertanent information unless stated regarding to the chief complaint.   Review of Systems: No headache, visual changes, nausea, vomiting, diarrhea, constipation, dizziness, abdominal pain, skin rash, fevers, chills, night sweats, weight loss, swollen lymph nodes, chest pain, shortness of breath, mood changes.   Objective  Blood pressure 102/62, pulse 85, weight 166 lb (75.3 kg), SpO2 97 %.    General: No apparent distress alert and oriented x3 mood and affect normal, dressed appropriately. Very mild masked facies HEENT: Pupils equal, extraocular movements intact  Respiratory: Patient's speak in full sentences  Cardiovascular: No lower extremity edema, non tender, no erythema  Skin: Warm dry intact with no signs of infection or rash on extremities or on axial skeleton.  Abdomen: Soft nontender  Neuro: Cranial nerves II through XII are intact, neurovascularly intact in all extremities with 2+ DTRs and 2+ pulses.  Lymph: No lymphadenopathy of posterior or anterior cervical chain or axillae bilaterally.  Gait normal with good balance and coordination.  MSK:  Non tender with full range of motion and good stability and symmetric strength and tone of shoulders, elbows, wrist, hip, knee and ankles bilaterally. Mild rigidity with movement mostly on the right side. Continue tremor of the right lower extremity the worsening Back Exam:  Inspection: Unremarkable  Motion: Flexion 35 deg, Extension 20 deg  Side Bending to 35 deg bilaterally,  Rotation to 35 deg bilaterally cogwheeling with range of motion still present and seems to be stable SLR laying: Negative  XSLR laying: Negative   Palpable tenderness: Minimal tenderness over the paraspinal musculature on the left sign but otherwise seems to be doing much better FABER: negative. Sensory change: Gross sensation intact to all lumbar and sacral dermatomes.  Reflexes: 2+ at both patellar tendons, 2+ at achilles tendons, Babinski's downgoing.  Strength at foot  Plantar-flexion:  5/5 Dorsi-flexion: 5/5 Eversion: 5/5 Inversion: 5/5  Leg strength   Quad: 5/5 Hamstring: 5/5 Hip flexor: 5/5 Hip abductors: 4+/5   Osteopathic findings C2 flexed rotated and side bent right T3 extended rotated and side bent right inhaled third rib T11 extended rotated and side bent left L2 flexed rotated and side bent right    Impression and Recommendations:      This case required medical decision making of moderate complexity.      Note: This dictation was prepared with Dragon dictation along with smaller phrase technology. Any transcriptional errors that result from this process are unintentional.

## 2016-09-26 ENCOUNTER — Encounter: Payer: Self-pay | Admitting: Family Medicine

## 2016-09-26 ENCOUNTER — Ambulatory Visit (INDEPENDENT_AMBULATORY_CARE_PROVIDER_SITE_OTHER): Payer: Medicare Other | Admitting: Family Medicine

## 2016-09-26 VITALS — BP 102/62 | HR 85 | Wt 166.0 lb

## 2016-09-26 DIAGNOSIS — M940 Chondrocostal junction syndrome [Tietze]: Secondary | ICD-10-CM

## 2016-09-26 DIAGNOSIS — G20 Parkinson's disease: Secondary | ICD-10-CM

## 2016-09-26 DIAGNOSIS — M999 Biomechanical lesion, unspecified: Secondary | ICD-10-CM | POA: Diagnosis not present

## 2016-09-26 NOTE — Assessment & Plan Note (Signed)
Continues to be second due to the muscle spasm likely secondary to the Parkinson's disease. Patient is continuing to stay active. We discussed icing regimen and home exercises. Encourage patient to monitor for any type of lifting mechanics that could be conjoining. Follow-up again in 4 weeks

## 2016-09-26 NOTE — Patient Instructions (Signed)
Good to see you  Get back on the greens.  See me again in 4 weeks.

## 2016-09-26 NOTE — Assessment & Plan Note (Signed)
Decision today to treat with OMT was based on Physical Exam  After verbal consent patient was treated with HVLA, ME, FPR techniques in  Cervical, thoracic, rib lumbar Patient tolerated the procedure well with improvement in symptoms  Patient given exercises, stretches and lifestyle modifications  See medications in patient instructions if given  Patient will follow up in 3-4 weeks            

## 2016-09-26 NOTE — Assessment & Plan Note (Signed)
Continues to give him increasing muscle tone in rigidity. Likely contributing to most of the pain. Patient encouraged to continue to stay active. No change in medications.

## 2016-09-27 NOTE — Progress Notes (Signed)
NEUROPSYCHOLOGICAL EVALUATION   Name:    Ian [name]  Date of Birth:   Nov 23, 1961 Date of Interview:  09/12/2016 Date of Testing:  09/22/2016  Date of Feedback:  09/29/2016      Background Information:  Reason for Referral:  Ian [name] is a 55 y.o., married male referred by Dr. Lurena Joiner [name] to assess his current level of cognitive functioning and assist in differential diagnosis. This was also part of a pre-surgical evaluation for deep brain stimulation (DBS) to assist in the management of Parkinson's disease. The current evaluation consisted of a review of available medical records, an interview with the patient and his wife, and the completion of a neuropsychological testing battery. Informed consent was obtained.  History of Presenting Problem:  Ian [name] is followed by Dr. Arbutus Leas for idiopathic Parkinson's disease. He has tremor, bradykinesia, rigidity and mild postural instability, per her notes. The patient has had symptoms for at least six years.   At today's visit, the patient denied having concerns about cognitive functioning.    Upon direct questioning, the patient reported the following:   Forgetting recent conversations/events: No Repeating statements/questions: No Misplacing/losing items: No Forgetting appointments or other obligations: No Forgetting to take medications: Miss a dose occasionally, or forgets to bring meds with him (e.g. Doesn't realize he will be gone that long)  Difficulty concentrating: If PD sx are really bad that day, it can affect his concentration, but nothing significant. Wife says he has always been kind of hyper and all over the place. Patient says he thinks he has "a touch of ADHD". No academic issues as a child though.  Processing information more slowly: No  Word-finding difficulty: Minimal (feels it is within normal limits) Comprehension difficulty: No but they do misunderstand each other more (used to just immediately understand  each other, takes more explaining sometimes)  Getting lost when driving: No Making wrong turns when driving: No Uncertain about directions when driving or passenger: No Tries to minimize distractions when he drives and be more cautious   Mr. Kumpf has not worked since January 2014. 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 He and his wife reported that he did not have a proper diagnosis or treatment at that time so he was unable to function very well. He was taking propanolol and it was causing problems. He believes he could have worked if he had been properly treated at that time. His plan was to take a break from working while he did further medical workup, and he actually had a few job offers from [employer], but then he got the diagnosis of PD.   Ian [name] achieves 6-7 hours of sleep a night, but does have a tendency to wake in the middle of the night and have trouble falling back asleep. Whether he has a good or bad night of sleep greatly affects his functioning the next day. He does have vivid dreams and REM sleep behaviors 1-2 nights a week. He was taking clonazepam but weaned himself off this because he did not want to be "addicted" to it.  His appetite is decreased overall, but it has improved slightly lately.   He has not had any hallucinations.    Current Functioning: Ian [name] lives with his wife in their own home. He manages instrumental ADLs, including driving, medications and appointments. He is very physically active and has found this to help his parkinsonian symptoms. He exercises regularly, stays busy with projects in  his home and yard, sings in the male choir in his church, maintains a large aquarium, and is involved in the [employer] in neuro-rehabilitation.   [name] reported that his mood is "pretty good for the most part, considering." His faith helps a lot. He reported that he tries "not to go down the path" of depression. He  and his wife agree that he is more quick-tempered but he is able to catch himself. He does not feel he is depressed but admits he feels melancholy from time to time. He appears to have good coping tools to improve his mood. He does endorse having anxiety and reluctance to do things he used to do, because he is worried his symptoms of PD will become apparent to others (e.g., public speaking). He tends to stay at home more than he used to. He has not told many people that he has PD. He does not want people to feel sorry for him. He appears somewhat conflicted about whether or not it would be better to just tell everyone he knows that he has PD. He stated that he puts a lot of energy and work into "trying to be normal".  He denied past or present suicidal ideation or intention. No imminent risk of self-harm was identified.  Psychiatric History: Psychiatric history prior to onset of Parkinson's disease was denied. He did see a psychologist for 6-12 months after he began having symptoms but did not yet have a PD diagnosis, to assist with adjustment to symptoms.   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 3 children (23yo son, twin girls age 79) Alcohol/Tobacco/Substances: Very seldom alcohol, never a smoker, no substance abuse   Medical History:  Past Medical History:  Diagnosis Date  . PD (Parkinson's disease) (HCC) 09/10/2013    Current medications:  Outpatient Encounter Prescriptions as of 09/29/2016  Medication Sig  . Ascorbic Acid (VITAMIN C PO) Take 5,000 mg by mouth daily.   . Carbidopa-Levodopa ER ([name]) 48.75-195 MG CPCR 1 tablet by Does not apply route once. Take 4 tablets in the morning then, 3 tablets TID daily (Patient taking  differently: Take 3-4 tablets by mouth See admin instructions. Take 4 tablets in the morning then, 3 tablets three times daily)  . Cholecalciferol (D-5000) 5000 UNITS TABS Take 1 tablet by mouth daily.   . Coenzyme Q10 (COQ10 PO) Take 100 mg by mouth daily.  [name] ibuprofen (ADVIL,MOTRIN) 400 MG tablet Take 1 tablet (400 mg total) by mouth every 6 (six) hours as needed.  [name] LECITHIN PO Take 1 capsule by mouth daily.   [name] MAGNESIUM PO Take 800 mg by mouth daily.   . Multiple Vitamin (MULTI-VITAMINS) TABS Take 1 tablet by mouth daily.   . niacin (NIASPAN) 1000 MG CR tablet Take 1,000 mg by mouth daily.  [name] oxyCODONE-acetaminophen (ROXICET) 5-325 MG tablet Take 1 tablet by mouth every 4 (four) hours as needed for severe pain.  [name] rOPINIRole ([name]) 4 MG 24 hr tablet Take 2 tablets (8 mg total) by mouth 2 (two) times daily.  . vitamin E 400 UNIT capsule Take 400 Units by mouth daily.    No facility-administered encounter medications on file as of 09/29/2016.      Current Examination:  Behavioral Observations:  Appearance: Neatly and appropriately dressed, thin appearing.  Mild resting tremor observed in extremities, not continuous.  Gait: Ambulated independently Speech: Fluent; generally normal rate, rhythm and volume Thought process: Linear, goal directed Affect: Mildly reduced facial expression, generally euthymic, some anxiety Interpersonal: Very pleasant, appropriate Orientation: Oriented to all spheres. Accurately named the current President and his predecessor   Tests Administered: . Test of Premorbid Functioning (TOPF) . Wechsler Adult Intelligence Scale-Fourth Edition (WAIS-IV): Similarities, Block Design, Matrix Reasoning, Arithmetic, Symbol Search, Coding and Digit Span subtests . Wechsler Memory Scale-Fourth Edition (WMS-IV) Adult Version (ages 5316-69): Logical Memory I, II and Recognition subtests  . [place] Verbal Learning Test - 2nd Edition (CVLT-2) Standard Form . [place]  Card Sorting Test (WCST) . Repeatable Battery for the Assessment of Neuropsychological Status (RBANS) Form A:  Figure Copy and Recall subtests . Neuropsychological Assessment Battery (NAB) Language Module, Form 1: Naming Subtest . Controlled Oral Word Association Test (COWAT) . Trail Making Test A and B . Clock drawing test . Generalized Anxiety Disorder - 7 item screener (GAD-7) . Beck Depression Inventory - Second edition (BDI-II) . Parkinson's Disease Questionnaire (PDQ-39)  Test Results: Note: Standardized scores are presented only for use by appropriately trained professionals and to allow for any future test-retest comparison. These scores should not be interpreted without consideration of all the information that is contained in the rest of the report. The most recent standardization samples from the test publisher or other sources were used whenever possible to derive standard scores; scores were corrected for age, gender, ethnicity and education when available.   Test Scores:  Test Name Standardized Score Descriptor  TOPF SS= 115 High average  WAIS-IV Subtests    Similarities ss= 10 Average  Block Design ss= 9 Average  Matrix Reasoning ss= 10 Average  Arithmetic ss= 10 Average  Symbol Search ss= 7 Low average  Coding ss= 8 Average  Digit Span  ss= 10 Average  WMS-IV Subtests    LM I ss= 11 Average  LM II ss= 11 Average  LM II Recognition Cum %: >75 Above average  RBANS Subtests    Figure Copy Z= 1.3 High average  Figure Recall Z= -0.8 Low average  CVLT-II Scores    Trial 1 Z= -1.5 Borderline  Trial 5 Z= 0.5 Average  Trials 1-5 total T= 44 Average  SD Free Recall Z= 0.5 Average  SD Cued Recall Z= 0 Average  LD Free Recall Z= -0.5 Average  LD Cued Recall Z= -1 Low average  Recognition Discriminability (13/16 hits, 8 false positives) Z= -1 Low average  Forced Choice Recognition Raw= 16/16 WNL  WCST    Total Errors T= 39 Low average  Perseverative Responses T= 37 Low  average  Perseverative Errors T= 38 Low average  Conceptual Level Responses T= 39 Low average  Categories Completed 3; >16%ile WNL  Trials to Complete 1st Category 34; 6-10%ile Low average  Failure to Maintain set 0 WNL  NAB Naming T= 52 Average  COWAT-FAS T= 40 Low average  COWAT-Animals T= 41 Low average  Trail Making Test A 0 errors T= 48 Average  Trail Making Test B 0 errors T= 53 Average  Clock Drawing  WNL   GAD-7 3/21 WNL   BDI-II 7/63 WNL  PDQ-39    Mobility 5%   Activities of Daily Living 0   Emotional Well Being 12.5%   Stigma 37.5%   Social Support 0   Cognitive Impairment 6.25%   Communication 8.33%   Bodily Discomfort 25%      Description of  Test Results:  Premorbid verbal intellectual abilities were estimated to have been within the high average range based on a test of word reading. Psychomotor processing speed was low average to average. Auditory attention and working memory were average. Visual-spatial construction was average to high average. Language abilities were within normal limits. Specifically, confrontation naming was average, and semantic verbal fluency was low average. With regard to verbal memory, encoding and acquisition of non-contextual information (i.e., word list) was average across five learning trials. After an interference task, free recall was average. After a delay, free recall was average. He did not benefit from semantic cueing. Performance on a yes/no recognition task was low average overall but notable for an elevated number of false positive responses. On another verbal memory test, encoding and acquisition of contextual auditory information (i.e., short stories) was average. After a delay, free recall was average. Performance on a yes/no recognition task was above average. With regard to non-verbal memory, delayed free recall of visual information was low average. Executive functioning was intact overall. Mental flexibility and set-shifting  were average on Trails B. Verbal fluency with phonemic search restrictions was low average. Verbal abstract reasoning was average. Non-verbal abstract reasoning was average. Deductive reasoning and problem-solving abilities were low average. Performance on a clock drawing task was intact. On self-report questionnaires, the patient's responses were not  indicative of clinically significant depression/anxiety at the present time. On a self-report measure assessing the influence of PD symptoms on functioning and quality of life, the patient reported experiencing stigma related to his PD symptoms. He also reported being bothered by bodily discomfort. He reported strong social support.    Clinical Impressions: Diagnosis deferred (no evidence of cognitive disorder or psychiatric disorder). Results of this evaluation were entirely within normal limits and there were no areas of impairment for his age. However, I do suspect there is mild attenuation of global cognitive functioning relative to his premorbid intellectual abilities which were estimated to be well above average. This is likely due to reduced cognitive efficiency and mild disruption of frontal-subcortical networks as a result of Parkinson's disease. Still, the patient does not meet criteria for a cognitive disorder at this time, based on his test results. He also does not meet criteria for any psychiatric disorder. There is no evidence of significant depression. I do feel he has some anxiety related to his Parkinson's symptoms but this does not appear to be significantly interfering with his functioning.    IMPORTANT CONSIDERATIONS FOR DBS CANDIDACY   1. IS THE PATIENT EXPERIENCING COGNITIVE SYMPTOMS THAT FAR EXCEED WHAT IS EXPECTED FOR PD? No.   2. IS THERE A SEPARATE NEUROLOGICAL PROCESS AT WORK? No.   3. WERE ANY PSYCHOSOCIAL STRESSORS IDENTIFIED WITHIN THE INDIVIDUAL AND/OR FAMILY BEYOND PD THAT MAY IMPACT UPON POST-OPERATIVE ADJUSTMENT? No  significant stressors were identified. The patient has only shared his diagnosis with family and close friends. He reported that having the procedure will mean that he will likely tell more people about his diagnosis, and he feels ready to do this. I agree that it will actually likely help reduce his stress for him to share this with more people in his life.  The patient's wife was unable to attend his follow-up session so I did not have the chance to speak with her more in-depth about her feelings regarding the procedure. However, Mr. Wieber reported that she is most concerned about possible complications. He noted that all of her immediate family members passed away within five years,  and he was also diagnosed with PD in that time frame, so she has had a lot of stress related to others' medical issues. He does not feel this would interfere with her ability to support him, and in fact he reported that she is "the ultimate caregiver".   4. CAN THIS PERSON COPE WITH THE STRESS OF SURGERY AND BE COMPLIANT AS AN AWAKE PARTICIPANT IN SURGERY? It is my impression that he would be able to tolerate all aspects of the DBS procedure.   5. CAN THIS PERSON PARTICIPATE IN THE MULTIPLE POST-OPERATIVE PROGRAMMING SESSIONS AND MEDICATION ADJUSTMENTS?  Mr. Villalon expressed excellent understanding for the post-operative requirements of DBS. He appears to have realistic expectations of the DBS procedure and good motivation for all aspects of participation. He appears to have a strong level of social support from family and friends.    6. FROM A NEUROPSYCHOLOGICAL PERSPECTIVE, DOES THIS PERSON APPEAR TO BE A GOOD CANDIDATE FOR DBS? It is my opinion that he is an excellent candidate from a neuropsychological perspective. He has researched, reflected on and accepted all information pertaining to DBS. He has sought out and developed relationships with others who have been through the procedure, and he understands that no specific  outcome is guaranteed. He understands the importance of adhering to physician recommendations (and he openly admits he learned this specifically through an experience whereby he followed misguided, non-professional advice in the past and ended up much worse off).    I did advise that the patient and his wife should discuss these issues further with Dr. Arbutus Leas at their next appointment, particularly to address any concerns of his wife, since she was not able to attend the current appointment. I explained that since his wife is his primary support, and will continue to be during the DBS process, it is just as important that she feel comfortable with the process and accepting of the procedures, expectations and possible outcomes, and that she have all her questioned answered to her satisfaction.     Feedback to Patient: Rorey Bisson returned for a feedback appointment on 09/29/2016 to review the results of his neuropsychological evaluation with this provider. 30 minutes face-to-face time was spent reviewing his test results and my impressions as detailed above.    Total time spent on this patient's case: [id number] unit for interview with psychologist; [phone] units of testing by psychometrician under psychologist's supervision; [phone] units for medical record review, scoring of neuropsychological tests, interpretation of test results, preparation of this report, and review of results to the patient by psychologist.      Thank you for your referral of Gianni Mihalik. Please feel free to contact me if you have any questions or concerns regarding this report.

## 2016-09-29 ENCOUNTER — Ambulatory Visit: Admitting: Neurology

## 2016-09-29 ENCOUNTER — Ambulatory Visit (INDEPENDENT_AMBULATORY_CARE_PROVIDER_SITE_OTHER): Admitting: Psychology

## 2016-09-29 DIAGNOSIS — F4322 Adjustment disorder with anxiety: Secondary | ICD-10-CM

## 2016-09-29 DIAGNOSIS — G20 Parkinson's disease: Secondary | ICD-10-CM

## 2016-10-15 NOTE — Progress Notes (Signed)
Ian [name] was seen today in the movement disorders clinic for neurologic consultation at the request of his PCP, [name].  He has previously seen  [name],[name], [name] in [place].   This patient is accompanied in the office by his spouse who supplements the history.The consultation is for the evaluation of PD.  I did review [name]'s notes.  [name]'s notes are not available to me.    Hist first sx was tremor was R arm tremor.  It was in 2011 according to the patient today, but [name]'s notes indicate that perhaps this was in 2009.  He was dx with enhanced physiologic tremor.  He was started on propranolol with some help, but made him sluggish.  He went to see [name] in 2013 for an opinion in [name].  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  He was placed on baclofen and requip.  He thinks that the requip has helped; he is able to turn over in the bed better and move better.  The pt first saw [name] on 09/10/13.  [name] agreed with the Requip and start the patient on [name], but the patient is currently only on its once per day.  12/04/13 update:  The pt presents to the clinic today for his PD.  He is exercising faithfully.  He attends the PD exercise class through the rehab center. The pt was changed to the IR formulation of carbidopa/levodopa 25/100 last visit and is on it tid.  He is not sure it helps.  Afternoons are better than mornings.  carbidopa/levodopa 25/100 is taken at 8am/3:30 pm and bedtime. He is also on requip xl 6 mg daily.  Continues to have significant tremor.  01/27/14 update:  The patient presents today with his wife, who supplements the history.  The pt has a hx of PD and last visit, we decided to increase his carbidopa/levodopa to 2 po tid.  the patient states that he just felt lightheaded on this medication dosage  and up going back to [name] 3 times per day and then takes [name], half tablet twice per day. He remains on requip xl 6 mg daily.  Overall, he does feel fairly good.  He still has tremor.  He went to see [name] since our last visit and again talked about DBS therapy.  [name] mentioned to him that he looks much better and he had previously and had [name] score had declined.  Some yelling out in the sleep but this has not been a huge issue.  No sleepwalking/talking.  No falls.  He is exercising faithfully.  05/28/14 update:  The patient is presenting today for followup.  Last visit, I increased his [name] from 6 mg daily to 4 mg twice a day.  He is doing well with this.  Tremor is about the same.  He is on a strange combination of carbidopa levodopa, but when I tried to switch him off the IR version, the patient just felt lightheaded and ended up going back to a combination of IR and CR.  He is currently on 25/100 CR 3 times per day and then takes [name], half tablet twice per day and sometimes three times per day.  He does best in the evening and worst in the AM.   No swallowing troubles.  Has  dropped weight but thinks that it is exercise and was trying to follow gluten free diet. He is walking, doing a spinning class for PD, and has started golfing again, which he hasn't done in years.   Still considering DBS therapy.    08/27/14 update:  Patient presents today for followup.  He is currently on [name], 4mg  twice a day.  He is on a strange combination of carbidopa levodopa, but hasn't been able to tolerate the IR version well.[name].  He is currently on 25/100 CR 2-3 times per day and then 50/200 at night.  He rarely takes the IR now; only if playing golf or if he needs a boost during the day.  He is finding that he is having more tremor.  He is also having dreams at night that are making his sleep feel unrestful.  He is strongly  considering deep brain stimulation.  He asks me about details regarding this today.  He has asks me about potentially getting a refill on his Ativan.  He apparently got this filled from an outside physician about a year and a half ago and only had 10 pills in total and is just now running out.  He takes one pill every 3 or 4 months if he has to go to a function with his wife.  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  He asks me if he can have a refill today.  09/10/14 update:  The patient is seen today unexpectedly.  Last visit, I started [name] 145 mg, 2 tablets 3 times per day.  He is also on [name] 4 mg twice a day.  He called me initially to tell me that the [name] was working great and he felt much better on the medication.  In fact, he states that his PT and OT reported that he looked much better and his eval scores were markedly improved and they asked him what he had done differently.  He states last week was just a great week.  On Saturday, he was just a little lightheaded and his wife checked his orthostatics and he states that they were negative.  This week, however, he began to report some abnormal movements.  The movements were primarily of the left shoulder and in the abdomen. I wanted to see him on this medication before added or changed anything, as I have never seen him with dyskinesia.  Unfortunately, this morning he only took one of his tablets, but he did take 2 tablets at lunch time about 1 hour before coming.  He has not noticed the movements today.  He has noticed much less tremor overall since starting [name].  11/25/14 update:  The patient returns today for follow-up.  He is on [name] 4 mg twice a day and [name] 145 mg, 2 tablets 3 times per day.  His biggest issue is tremor, but states that its under good control today (but he just exercised).  He has developed tendinitis in the elbow.  He has clonazepam for REM behavior  disorder but admits he doesn't use it.  The patient remains very active in our Parkinson's exercise classes.  No falls.  Some days he does have to "fight" the posture and the "shuffling."  He didn't ever get to see Dr. Venetia MaxonStern as he states that his life got hectic moving his mother in law.    02/20/15 update:  The patient returns today, accompanied by his wife  who supplements the history.  Last visit, I increased his [name] to 195 mg, and he is taking 2 tablets 3 times a day.  He is also on [name] 4 mg twice a day.  He reports that he has not been doing as well because stress levels have been increased.  His mother-in-law, who is living with them, died unexpectedly.  Because of that, he has had more trouble sleeping as well.  He is still faithfully attending exercise classes and is involved in the Parkinson's community.  He has noticed more rigidity and tremor.  He is taking melatonin, 5 Mill grams at night to help him sleep but does not want to use clonazepam, even though he has at home.  05/24/25 update:  The patient returns today for follow-up.  His wife accompanies him and supplements the history.  He is on [name] 4 mg twice a day and [name] 195 mg, 2 tablets 3 times per day.  He has not wanted to increase the dose, although he likely could use more levodopa. States that he is more tremulous right now and attributes that to the fact that he has had a GI illness recently, which made sx's worse.  He has had emesis (one episode).  No diarrhea.  No fevers.    He has not had any falls.  He denies any hallucinations.  No confusion.  No lightheadedness or near syncope.  He continues to exercise faithfully.  06/12/15 update:  The patient is following up today, accompanied by his wife who supplements the history.  When I saw him last on 05/22/15, he was obviously underdosed and I expressed the need for more levodopa but the pt didn't want to increase it as he felt that his viral gastroenteritis was just causing his  worsening sx's.  He didn't tell me then that he had been off of his meds for 3 days and that is the reason he didn't look well.  They called me on 06/10/2015 however to state that he was doing much worse.  They wanted me to work him in that day, but I was not able to.  I didn't realize that he had been off all his PD meds since June 1 and had started receiving care from a natureopath in [place], Dr. Beckie BusingUrban.  He was given information on Dr. Beckie BusingUrban by his dentist Dr. Mellody DanceKeith, who told him he should try amino acid therapy.  He researched and started seeing Dr. Beckie BusingUrban and was told he needed to be off all his PD meds.  He started on D5-mucuna which markets itself as a "safe, natural" levodopa therapy.  He was told that he needed 4-6 months to see a response, but admits that he was told to tell his doctor what he was doing.  He was increasing the dose of this supplement based on urine dopamine levels which they were told were low.  They apparently were also measuring urine serotonin levels.  Not surprisingly, the patient decompensated.  He states that initially he didn't "feel that bad" but the "tremor was awful."  He also states that he had terrible vivid dreams and ultimately became exhausted from not sleeping, shaking and increased rigidity. It got so bad that he ended up in the ER 2 days ago.    He is back on rytary 195, 2 po tid.  He is a little quesy since being back on it.  He has only been on it for 2 days.  He states that he  talked with the natureopath and she told him to just "start back on it."  He is off of the D5-mucuna.  He and his wife bring in several articles for me to read today.    09/29/15 update:  The patient is following up today, accompanied by his wife who supplements the history.  He is on [name] 4 mg twice a day.  He reports that he is now taking  [name] 195 mg, 2 tablets 3 times a day along with [name] 145 mg, one tablet 3 times a day.  He spreads it out so that he takes it at 8 AM/3 PM/11 PM.   He does not necessarily halfway he describes as "traditional wearing off" but states that at the end of the dose he just does not feel good.  Overall, the patient states that he has been doing okay but he also feels that things are more unpredictable.  He feels more tired and thinks that he has mild dyskinesia.  When he eats, he is biting his lip in the same place.   He denies any lightheadedness or near syncope.  He denies falls.  When asked about depression, he states that he thinks "depression is a strong word" but admits that he gets frustrated and overwhelmed about the disease but uses exercise as a release.  He has sent me paperwork since last visit for his disability which has been completed.  He asks me about writing another letter regarding his VA disability as well.  12/30/15 update:  The patient is following up today.  Last visit, I increased his [name] to 6 mg twice a day.  His [name] has been slightly increased, so that he is on 195 mg, 3 tablets 3 times per day and he added an additional 1 tablet at night because he was awakening at 4 AM with tremor. He has changed that again, but taking the same overall dose of rytary, but is now taking 195, 3 in the AM, 2 in the afternoon, 3 in the evening and then takes 2 at bedtime.   Overall, he thinks he feels that it is a little inconsistent; sometimes he does well and sometimes he doesn't.  He continues to faithfully exercise.  No falls.  No hallucinations.  No syncope but has had some lightheadedness; he has had a uri and isn't sure if it is related to that/meds for that.  Mood has been good.  Sleep has been better since he has been back on his medications.  Appetite has been good.  No new medical issues.  He is still going to integrative therapies and thinks that it is helpful.  However, he did some therapy called alpha stim and was told that it changed the alpha waves in the brain and he didn't think that it was helpful and thinks that it made him feel  strange.  Feels that he has minor dyskinesia, especially of the arm when he walks or bends over.  Asks about DBS therapy and thinks he is going to consider that more this year.    03/03/16 update:  The patient is following up today.  He is on [name], 6 mg twice a day and [name] 195 mg on the following schedule: 3/3/3/2.  This is a little higher than last visit (prior 3/2/3/2).  He denies any falls since last visit.  He denies lightheadedness or near syncope.  No hallucinations.  Tremor has been pretty good, but it will fluctuate throughout the day (  states that he did 2 workouts today).  He does occasionally feel like he has some dyskinesia.  He saw Dr. Terrilee FilesZach Smith on February 6 and March 13 and I reviewed those records.  OMT was performed.  The patient feels that that was helpful for his musculoskeletal complaints.  He is doing lots of exercises.  Mood is stable.  Asks about meeting with Dr. Venetia MaxonStern for DBS but wife not ready to pursue.    06/24/16 update:  The patient is following up today, accompanied by his wife who supplements the history.    He is here for her levodopa challenge test.  He saw Dr. Venetia MaxonStern in May and is interested in DBS therapy.  Last visit, I increased his [name] so that he is taking 4 mg, 2 tablets twice a day.  He remains on [name] 195 mg on the following schedule: 3/3/3/2.  He takes clonazepam as needed for REM behavior disorder.  He has not had falls since last visit.  He has seen Dr. Terrilee FilesZach Smith several times and I reviewed those records.  He has not had any lightheadedness or near syncope.  He continues to exercise faithfully.  10/18/16 update:  Pt f/u today.  Wife doesn't accompany today.  He saw Dr. Alinda DoomsBailar since our last visit and no evidence of cognitive deficits.  Pt on [name] 4 mg - 2 po bid and rytary 195 mg: and he has changed that from 3/3/3/2 to 4/3/3/3.  He has a little more off time with more tremor and slowness.  In late summer, he was putting a putting green into his  back yard and states that he tore an intercostal muscle so he couldn't exercise for 8-9 weeks because of pain with breathing.  Started back to working out 3-4 weeks ago.     PREVIOUS MEDICATIONS: Requip and neupro (tried few days only); propranolol helped tremor some  ALLERGIES:  No Known Allergies  CURRENT MEDICATIONS:     Medication List       Accurate as of 10/18/16  9:00 AM. Always use your most recent med list.          Carbidopa-Levodopa ER 48.75-195 MG Cpcr Commonly known as:  [name] 1 tablet by Does not apply route once. Take 4 tablets in the morning then, 3 tablets TID daily   COQ10 PO Take 100 mg by mouth daily.   D-5000 5000 units Tabs Generic drug:  Cholecalciferol Take 1 tablet by mouth daily.   ibuprofen 400 MG tablet Commonly known as:  ADVIL,MOTRIN Take 1 tablet (400 mg total) by mouth every 6 (six) hours as needed.   LECITHIN PO Take 1 capsule by mouth daily.   MAGNESIUM PO Take 800 mg by mouth daily.   MULTI-VITAMINS Tabs Take 1 tablet by mouth daily.   niacin 1000 MG CR tablet Commonly known as:  NIASPAN Take 1,000 mg by mouth daily.   oxyCODONE-acetaminophen 5-325 MG tablet Commonly known as:  ROXICET Take 1 tablet by mouth every 4 (four) hours as needed for severe pain.   rOPINIRole 4 MG 24 hr tablet Commonly known as:  [name] Take 2 tablets (8 mg total) by mouth 2 (two) times daily.   VITAMIN C PO Take 5,000 mg by mouth daily.   vitamin E 400 UNIT capsule Take 400 Units by mouth daily.        PAST MEDICAL HISTORY:   Past Medical History:  Diagnosis Date  . PD (Parkinson's disease) (HCC) 09/10/2013  PAST SURGICAL HISTORY:   Past Surgical History:  Procedure Laterality Date  . none      SOCIAL HISTORY:   Social History   Social History  . Marital status: Married    Spouse name: N/A  . Number of children: N/A  . Years of education: N/A   Occupational History  . Not on file.   Social History Main Topics  .  Smoking status: Never Smoker  . Smokeless tobacco: Never Used  . Alcohol use Yes     Comment: Occ  . Drug use: Unknown  . Sexual activity: Not on file   Other Topics Concern  . Not on file   Social History Narrative  . No narrative on file    FAMILY HISTORY:   Family Status  Relation Status  . Mother Deceased at age 55   Breast Cancer  . Father Alive   Diabetes  . Sister Alive   healthy  . Daughter Alive   2, twins  . Son Alive  . Other     ROS: A complete 10 system review of systems was obtained and was unremarkable apart from what is mentioned above.  PHYSICAL EXAMINATION:    VITALS:   Vitals:   10/18/16 0852  BP: 114/70  Pulse: 78  Weight: 170 lb (77.1 kg)  Height: 6\' 2"  (1.88 m)   Wt Readings from Last 3 Encounters:  10/18/16 170 lb (77.1 kg)  09/26/16 166 lb (75.3 kg)  08/29/16 169 lb (76.7 kg)    GEN:  The patient appears stated age and is in NAD. HEENT:  Normocephalic, atraumatic.  The mucous membranes are moist. The superficial temporal arteries are without ropiness or tenderness. CV:  RRR Lungs:  CTAB Neck/HEME:  There are no carotid bruits bilaterally.  Neurological examination:  Orientation: The patient is alert and oriented x3. Fund of knowledge is appropriate.  Recent and remote memory are intact.  Attention and concentration are normal.    Able to name objects and repeat phrases. Cranial nerves: There is good facial symmetry.  There is facial hypomimia.  Pupils are equal round and reactive to light bilaterally. The speech is fluent and clear. Soft palate rises symmetrically and there is no tongue deviation. Hearing is intact to conversational tone. Sensation: Sensation is intact to light touch throughout. Motor: Strength is 5/5 in the bilateral upper and lower extremities.   Shoulder shrug is equal and symmetric.  There is no pronator drift.   Movement examination prior to the administration of levodopa, his [name] motor SCORE was 30: Tone:  There is severe rigidity in the RUE and right lower extremity.  There is mild to moderate rigidity in the left upper extremity. Abnormal movements: There is bilateral LE tremor and occ RUE tremor Coordination:  There is decremation with RAM's, seen primarily with toe taps/heel taps today on the right, but it is better.   Gait and Station: The patient has no difficulty arising out of a deep-seated chair without the use of the hands.  He turns en bloc.  He has occasional start hesitation.  He has a negative pull test.  The patient was given 300 mg of levodopa dissolved in ginger ale and reexamined and [name] motor off score was 11.  Following the examination his movement examination was as follows:    Tone: There is still mild-moderate rigidity in the right upper extremity.  Tone on the left was much improved.  Tone in the right lower extremity was mod increased. Abnormal  movements: There is still near constant left lower extremity tremor. Coordination:  There is near normal rapid alternating movements. Gait and Station: The patient has no difficulty arising out of a deep-seated chair without the use of the hands.  He has no start hesitation and doesn't turn en bloc. He walks very well  He has a negative pull test.  ASSESSMENT/PLAN:  1. idiopathic Parkinson's disease.  The patient has tremor, bradykinesia, rigidity and mild postural instability.  -continue requip xl  4mg  - 2 po bid  -Now on rytary from 195 4/3/3/3.  He wants a med for prn.  Will just add carbidopa/levodopa 25/100 prn but not to take more than 2 extra per day.  Will only do this prior to surgery and don't plan on continuing after surgery.  We discussed risks, benefits.  We discussed the fact that this would not give him any greater quality of life than levodopa when it is working its best.  We discussed rates of infection, seizure, bleeding, stroke, intracerebral hemorrhage, death.  Alternative opinions were offered.  We talked about the  fact that this was not a cure for Parkinson's disease and memory change, speech, balance will continue to progress.  We talked about the fact that DBS could worsen speech issues if not placed correctly or if not programmed correctly.  We talked in detail about logistics of the surgery.  Pt expressed understanding and desire to proceed with bilateral STN DBS.  We will get him an appt with Dr. Venetia Maxon (if needed again) and order pre-op MRI.  Will need pre-op video.   2.  Possible depression   -Managing with exercise and thinks that he is doing fairly well.  3.  REM behavior disorder.  -He has clonazepam and uses occasionally. 4.  Much greater than 50% of this visit was spent in counseling and coordinating care.  Total face to face time:  30 min

## 2016-10-18 ENCOUNTER — Encounter: Payer: Self-pay | Admitting: Neurology

## 2016-10-18 ENCOUNTER — Ambulatory Visit (INDEPENDENT_AMBULATORY_CARE_PROVIDER_SITE_OTHER): Payer: Medicare Other | Admitting: Neurology

## 2016-10-18 VITALS — BP 114/70 | HR 78 | Ht 74.0 in | Wt 170.0 lb

## 2016-10-18 DIAGNOSIS — G4752 REM sleep behavior disorder: Secondary | ICD-10-CM

## 2016-10-18 DIAGNOSIS — G20 Parkinson's disease: Secondary | ICD-10-CM

## 2016-10-18 MED ORDER — CARBIDOPA-LEVODOPA 25-100 MG PO TABS: 1.0000 | ORAL_TABLET | Freq: Two times a day (BID) | ORAL | 1 refills | Status: DC

## 2016-10-18 NOTE — Patient Instructions (Signed)
1.  You can take an extra [name], 1-2 times per day as needed. 2.  I will call Dr. Fredrich BirksStern's office 3.  I will get pre-op MRI scheduled

## 2016-10-18 NOTE — Addendum Note (Signed)
Addended by: Vivien RotaRIVER, Fain Francis H on: 16/10/960410/31/2017 09:38 AM   Modules accepted: Orders

## 2016-10-19 ENCOUNTER — Telehealth: Payer: Self-pay | Admitting: Neurology

## 2016-10-19 DIAGNOSIS — G20 Parkinson's disease: Secondary | ICD-10-CM

## 2016-10-19 DIAGNOSIS — Z01818 Encounter for other preprocedural examination: Secondary | ICD-10-CM

## 2016-10-19 NOTE — Telephone Encounter (Signed)
[facility] see if [facility] needs another appt with Dr. Venetia MaxonStern.   [facility] on 10/25/2016 at 3:00 pm to arrive at 2:30 pm. Tried to call patient to make him aware and no answer. Will try again later.

## 2016-10-19 NOTE — Telephone Encounter (Signed)
-----   Message from Octaviano Battyebecca S [name], DO sent at 10/18/2016  9:25 AM EDT ----- Needs pre-op MRI Need to call Dr. Venetia MaxonStern and find out if needs another appt with schedule DBS surgery

## 2016-10-19 NOTE — Telephone Encounter (Signed)
[facility]terHaley called back and states that patient will need another appt with Dr. Venetia MaxonStern. She will call him to schedule.

## 2016-10-22 NOTE — Progress Notes (Signed)
[name] Dyani [name] D.O. [facility] [address] [place], [place] [address] Phone: [phone] Subjective:     FA:OZHYQMVCC:slipped rib syndrome follow-up  HQI:ONGEXBMWUXHPI:Subjective  Ian PurserKenneth [name] is a 55 y.o. male coming in with complaint of right-sided back pain. 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 Patient is doing very well with conservative therapy as well as responding well to osteopathic manipulation. Patient states Overall doing well. Patient continues to play golf fairly regularly. Recently got new orthotics from an outside facility but states that they are hurting his feet which is somewhat hurting his back. No radiation of pain. Seems to be doing relatively well.     Past Medical History:  Diagnosis Date  . PD (Parkinson's disease) (HCC) 09/10/2013   Past Surgical History:  Procedure Laterality Date  . none     Social History   Social History  . Marital status: Married    Spouse name: N/A  . Number of children: N/A  . Years of education: N/A   Social History Main Topics  . Smoking status: Never Smoker  . Smokeless tobacco: Never Used  . Alcohol use Yes     Comment: Occ  . Drug use: Unknown  . Sexual activity: Not Asked   Other Topics Concern  . None   Social History Narrative  . None   No Known Allergies Family History  Problem Relation Age of Onset  . Diabetes Other   . Cancer Other     Past medical history, social, surgical and family history all reviewed in electronic medical record.  No pertanent information unless stated regarding to the chief complaint.   Review of Systems: No headache, visual changes, nausea, vomiting, diarrhea, constipation, dizziness, abdominal pain, skin rash, fevers, chills, night sweats, weight loss, swollen lymph nodes, chest pain, shortness of breath, mood changes.   Objective  Blood pressure 112/66, pulse 68, height 6\' 2"  (1.88 m), weight 168 lb (76.2 kg), SpO2 96 %.  Systems examined below as of 10/24/16   General: No apparent distress alert and oriented x3 mood and affect normal, dressed appropriately. masked facies  HEENT: Pupils equal, extraocular movements intact  Respiratory: Patient's speak in full sentences  Cardiovascular: No lower extremity edema, non tender, no erythema  Skin: Warm dry intact with no signs of infection or rash on extremities or on axial skeleton.  Abdomen: Soft nontender  Neuro: Cranial nerves II through XII are intact, neurovascularly intact in all extremities with 2+ DTRs and 2+ pulses.  Lymph: No lymphadenopathy of posterior or anterior cervical chain or axillae bilaterally.  Gait mild broad base gait which is new.   MSK:  Continued to being nontender over the musculature and.rigidity with movement mostly on the right side. Continue tremor of the right lower extremity the worsening pill rolling tremor at baseline.  Back Exam:  Inspection: Unremarkable  Motion: Flexion 35 deg, Extension 20 deg  Side Bending to 35 deg bilaterally,  Rotation to 35 deg bilaterally cogwheeling with range of motion still present and seems to be stable SLR laying: Negative  XSLR laying: Negative   Palpable tenderness: continued tightness. Mild change.  FABER: negative. Sensory change: Gross sensation intact to all lumbar and sacral dermatomes.  Reflexes: 2+ at both patellar tendons, 2+ at achilles tendons, Babinski's downgoing.  Strength at foot  Plantar-flexion: 5/5 Dorsi-flexion: 5/5 Eversion: 5/5 Inversion: 5/5  Leg strength   Quad: 5/5 Hamstring: 5/5 Hip flexor: 5/5 Hip abductors: 4+/5   Osteopathic findings C2  flexed rotated and side bent right T3 extended rotated and side bent right inhaled third rib T8 extended rotated and side bent left  L2 flexed rotated and side bent right    Impression and Recommendations:     This case required medical decision making of moderate complexity.      Note: This dictation was prepared with Dragon dictation along with smaller  phrase technology. Any transcriptional errors that result from this process are unintentional.

## 2016-10-24 ENCOUNTER — Encounter: Payer: Self-pay | Admitting: Family Medicine

## 2016-10-24 ENCOUNTER — Ambulatory Visit (INDEPENDENT_AMBULATORY_CARE_PROVIDER_SITE_OTHER): Payer: Medicare Other | Admitting: Family Medicine

## 2016-10-24 VITALS — BP 112/66 | HR 68 | Ht 74.0 in | Wt 168.0 lb

## 2016-10-24 DIAGNOSIS — M999 Biomechanical lesion, unspecified: Secondary | ICD-10-CM

## 2016-10-24 DIAGNOSIS — M940 Chondrocostal junction syndrome [Tietze]: Secondary | ICD-10-CM

## 2016-10-24 DIAGNOSIS — G20 Parkinson's disease: Secondary | ICD-10-CM | POA: Diagnosis not present

## 2016-10-24 NOTE — Assessment & Plan Note (Signed)
Continue to be a problem, Continue the exercises, respond to OMT. Cointoinue to do exercises.  RTC in 4 weeks.

## 2016-10-24 NOTE — Assessment & Plan Note (Signed)
Patient is having some worsening symptoms. Is scheduled for an MRI. Being followed by neurology.

## 2016-10-24 NOTE — Patient Instructions (Signed)
[employer] "total support" online would be great  Keep doing what you are doing.  See me again in 4 weeks.

## 2016-10-24 NOTE — Assessment & Plan Note (Signed)
Decision today to treat with OMT was based on Physical Exam  After verbal consent patient was treated with HVLA, ME, FPR techniques in  Cervical, thoracic, rib lumbar Patient tolerated the procedure well with improvement in symptoms  Patient given exercises, stretches and lifestyle modifications  See medications in patient instructions if given  Patient will follow up in 3-4 weeks

## 2016-10-25 ENCOUNTER — Ambulatory Visit (HOSPITAL_COMMUNITY): Admission: RE | Admit: 2016-10-25 | Payer: TRICARE For Life (TFL) | Source: Ambulatory Visit

## 2016-11-07 ENCOUNTER — Encounter: Payer: Self-pay | Admitting: Neurology

## 2016-11-11 ENCOUNTER — Encounter: Payer: Self-pay | Admitting: Neurology

## 2016-11-14 NOTE — Telephone Encounter (Signed)
Morrie Sheldonshley, make sure that he has a driver.  Call in 5 mg of valium to be taken 45 min prior to the MRI and another 5 mg may be taken 15 min prior if needed.  When is his pre-op MRI scheduled ([facility])

## 2016-11-15 ENCOUNTER — Other Ambulatory Visit: Payer: Self-pay | Admitting: *Deleted

## 2016-11-15 MED ORDER — DIAZEPAM 5 MG PO TABS: 5.0000 mg | ORAL_TABLET | Freq: Once | ORAL | 0 refills | Status: AC

## 2016-11-15 NOTE — Telephone Encounter (Signed)
[facility] [phone] with confirmation received.

## 2016-11-21 ENCOUNTER — Ambulatory Visit (INDEPENDENT_AMBULATORY_CARE_PROVIDER_SITE_OTHER): Payer: Medicare Other | Admitting: Family Medicine

## 2016-11-21 ENCOUNTER — Encounter: Payer: Self-pay | Admitting: Family Medicine

## 2016-11-21 VITALS — BP 114/74 | HR 66 | Ht 74.0 in | Wt 170.0 lb

## 2016-11-21 DIAGNOSIS — G20 Parkinson's disease: Secondary | ICD-10-CM | POA: Diagnosis not present

## 2016-11-21 DIAGNOSIS — M940 Chondrocostal junction syndrome [Tietze]: Secondary | ICD-10-CM

## 2016-11-21 DIAGNOSIS — M999 Biomechanical lesion, unspecified: Secondary | ICD-10-CM

## 2016-11-21 NOTE — Patient Instructions (Addendum)
Great to see you  Hope the home project went well ;) You are doing amazing! No real changes.  See me again in 4-6 weeks Happy holidays!

## 2016-11-21 NOTE — Progress Notes (Signed)
[name] Breeley Bischof D.O. [facility] [address] [place], [place] [address] Phone: [phone] Subjective:     BJ:YNWGNFACC:slipped rib syndrome follow-up  OZH:YQMVHQIONGHPI:Subjective  Ian PurserKenneth Yaworski is a 55 y.o. male coming in with complaint of right-sided back pain. 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 Patient is doing very well with conservative therapy as well as responding well to osteopathic manipulation. Patient is going to have a deep brain stimulator placed next month. This will help him with his Parkinson's. This could also help with some of the muscle imbalances. Patient is happy about this. Overall though seems to be doing relatively well.      Past Medical History:  Diagnosis Date  . PD (Parkinson's disease) (HCC) 09/10/2013   Past Surgical History:  Procedure Laterality Date  . none     Social History   Social History  . Marital status: Married    Spouse name: N/A  . Number of children: N/A  . Years of education: N/A   Social History Main Topics  . Smoking status: Never Smoker  . Smokeless tobacco: Never Used  . Alcohol use Yes     Comment: Occ  . Drug use: Unknown  . Sexual activity: Not Asked   Other Topics Concern  . None   Social History Narrative  . None   No Known Allergies Family History  Problem Relation Age of Onset  . Diabetes Other   . Cancer Other     Past medical history, social, surgical and family history all reviewed in electronic medical record.  No pertanent information unless stated regarding to the chief complaint.   Review of Systems: No headache, visual changes, nausea, vomiting, diarrhea, constipation, dizziness, abdominal pain, skin rash, fevers, chills, night sweats, weight loss, swollen lymph nodes, chest pain, shortness of breath, mood changes.   Objective  Blood pressure 114/74, pulse 66, height 6\' 2"  (1.88 m), weight 170 lb (77.1 kg), SpO2 99 %.  Systems examined below as of 11/21/16   Systems examined below  as of 11/21/16 General: NAD A&O x3 mood, affect normal  HEENT: Pupils equal, extraocular movements intact no nystagmus Respiratory: not short of breath at rest or with speaking Cardiovascular: No lower extremity edema, non tender Skin: Warm dry intact with no signs of infection or rash on extremities or on axial skeleton. Abdomen: Soft nontender, no masses Neuro: Cranial nerves  intact, neurovascularly intact in all extremities with 2+ DTRs and 2+ pulses. Lymph: No lymphadenopathy appreciated today  Gait normal with good balance and coordination. Improvement from previous exam MSK:  Continued to being nontender over the musculature and.rigidity with movement mostly on the right side. Continue mild pill-rolling tremor of the right upper cavity. Back Exam:  Inspection: Unremarkable  Motion: Flexion 35 deg, Extension 20 deg  Side Bending to 35 deg bilaterally,  Rotation to 35 deg bilaterally cogwheeling with range of motion still present and seems to be stable SLR laying: Negative  XSLR laying: Negative   Palpable tenderness: Increased discomfort in the paraspinal musculature of the lumbar spine. FABER: negative. Sensory change: Gross sensation intact to all lumbar and sacral dermatomes.  Reflexes: 2+ at both patellar tendons, 2+ at achilles tendons, Babinski's downgoing.  Strength at foot  Plantar-flexion: 5/5 Dorsi-flexion: 5/5 Eversion: 5/5 Inversion: 5/5  Leg strength   Quad: 5/5 Hamstring: 5/5 Hip flexor: 5/5 Hip abductors: 4+/5   Osteopathic findings C4 flexed rotated and side bent right T3 extended rotated and side bent right  inhaled third rib T7 extended rotated and side bent left  L4 flexed rotated and side bent right    Impression and Recommendations:     This case required medical decision making of moderate complexity.      Note: This dictation was prepared with Dragon dictation along with smaller phrase technology. Any transcriptional errors that result from this  process are unintentional.

## 2016-11-21 NOTE — Assessment & Plan Note (Signed)
Decision today to treat with OMT was based on Physical Exam  After verbal consent patient was treated with HVLA, ME, FPR techniques in  Cervical, thoracic, rib lumbar Patient tolerated the procedure well with improvement in symptoms  Patient given exercises, stretches and lifestyle modifications  See medications in patient instructions if given  Patient will follow up in 4-6 weeks

## 2016-11-21 NOTE — Assessment & Plan Note (Signed)
Continues to do well overall. We discussed icing regimen and home exercise. We discussed which activities to do an which was to avoid. Patient will be having surgery in the next 6-8 weeks. I would like to see him before this for 1 more manipulation beforehand. Patient encouraged to do more posture and ergonomics. Follow-up again in 4-6 weeks.

## 2016-11-21 NOTE — Assessment & Plan Note (Signed)
Stable. Having a deep brain stimulator placed.

## 2016-11-23 ENCOUNTER — Ambulatory Visit (HOSPITAL_COMMUNITY): Admission: RE | Admit: 2016-11-23 | Payer: Medicare Other | Source: Ambulatory Visit

## 2016-11-30 ENCOUNTER — Telehealth: Payer: Self-pay | Admitting: Neurology

## 2016-11-30 ENCOUNTER — Encounter: Payer: Self-pay | Admitting: Neurology

## 2016-11-30 ENCOUNTER — Ambulatory Visit (HOSPITAL_COMMUNITY)
Admission: RE | Admit: 2016-11-30 | Discharge: 2016-11-30 | Disposition: A | Discharge status: Home / Self Care | Payer: Medicare Other | Source: Ambulatory Visit | Attending: Neurology | Admitting: Neurology

## 2016-11-30 DIAGNOSIS — Z01818 Encounter for other preprocedural examination: Secondary | ICD-10-CM | POA: Insufficient documentation

## 2016-11-30 DIAGNOSIS — G20 Parkinson's disease: Secondary | ICD-10-CM | POA: Insufficient documentation

## 2016-11-30 MED ORDER — GADOBENATE DIMEGLUMINE 529 MG/ML IV SOLN
15.0000 mL | Freq: Once | INTRAVENOUS | Status: AC | PRN
  Administered 2016-11-30: 15 mL via INTRAVENOUS

## 2016-11-30 NOTE — Progress Notes (Signed)
Reviewed.  Films adequate.

## 2016-11-30 NOTE — Telephone Encounter (Signed)
-----   Message from Octaviano Battyebecca S [name], DO sent at 11/30/2016 12:02 PM EST ----- Reviewed.  Films adequate.

## 2016-11-30 NOTE — Telephone Encounter (Signed)
Mychart message sent to patient.

## 2016-12-02 ENCOUNTER — Other Ambulatory Visit: Payer: Self-pay | Admitting: Neurology

## 2016-12-02 MED ORDER — CARBIDOPA-LEVODOPA ER 48.75-195 MG PO CPCR: 3.0000 | ORAL_CAPSULE | ORAL | 1 refills | Status: DC

## 2016-12-02 MED ORDER — ROPINIROLE HCL ER 4 MG PO TB24: 8.0000 mg | ORAL_TABLET | Freq: Two times a day (BID) | ORAL | 1 refills | Status: DC

## 2016-12-07 ENCOUNTER — Encounter: Payer: Self-pay | Admitting: Neurology

## 2016-12-08 ENCOUNTER — Telehealth: Payer: Self-pay | Admitting: Neurology

## 2016-12-08 NOTE — Telephone Encounter (Signed)
Long talk with the patient on the phone today.  Talked to him about the benefits of waiting for the [facility] device versus proceeding with the [employer] device.  Told him that the [facility] device just came to the market, so they're unknown's with that device.  However, the battery life is about 15 years.  Talked to him about directionality with the [facility] device, which could offer him benefit in the long run.  Talked to him about the knowns with the [employer]tronic device, which by the fact that it has been on the market for a long time and we know that works.  The patient was very willing to go with either device, stating that he really just wants to get the procedure done and have the best benefit.  Talked to him about the fact that I wanted to wait a few weeks and actually look at the [facility] device and see if that will be on the market and manufactured.  Gave him potential surgery dates.  I have a phone call in to Dr. Venetia MaxonStern about this as well.

## 2016-12-09 ENCOUNTER — Other Ambulatory Visit (HOSPITAL_COMMUNITY): Payer: Self-pay | Admitting: Neurosurgery

## 2016-12-09 ENCOUNTER — Other Ambulatory Visit: Payer: Self-pay | Admitting: Neurosurgery

## 2016-12-09 DIAGNOSIS — G20 Parkinson's disease: Secondary | ICD-10-CM

## 2016-12-14 ENCOUNTER — Telehealth: Payer: Self-pay | Admitting: Neurology

## 2016-12-14 NOTE — Telephone Encounter (Signed)
Mychart message sent to patient.

## 2016-12-14 NOTE — Telephone Encounter (Signed)
Last office note faxed to [facility] per request at [phone] with confirmation received.

## 2016-12-17 ENCOUNTER — Other Ambulatory Visit: Payer: Self-pay | Admitting: Neurology

## 2016-12-17 NOTE — Progress Notes (Deleted)
[name] Legacie Dillingham D.O. [facility] [address] [place], [place] [address] Phone: [phone] Subjective:     BJ:YNWGNFACC:slipped rib syndrome follow-up  OZH:YQMVHQIONGHPI:Subjective  Ian PurserKenneth [name] is a 55 y.o. male coming in with complaint of right-sided back pain. 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 Patient is doing very well with conservative therapy as well as responding well to osteopathic manipulation. Patient is going to have a deep brain stimulator placed next month. This will help him with his Parkinson's. This could also help with some of the muscle imbalances. Patient is happy about this. Overall though seems to be doing relatively well.      Past Medical History:  Diagnosis Date  . PD (Parkinson's disease) (HCC) 09/10/2013   Past Surgical History:  Procedure Laterality Date  . none     Social History   Social History  . Marital status: Married    Spouse name: N/A  . Number of children: N/A  . Years of education: N/A   Social History Main Topics  . Smoking status: Never Smoker  . Smokeless tobacco: Never Used  . Alcohol use Yes     Comment: Occ  . Drug use: Unknown  . Sexual activity: Not on file   Other Topics Concern  . Not on file   Social History Narrative  . No narrative on file   No Known Allergies Family History  Problem Relation Age of Onset  . Diabetes Other   . Cancer Other     Past medical history, social, surgical and family history all reviewed in electronic medical record.  No pertanent information unless stated regarding to the chief complaint.   Review of Systems: No headache, visual changes, nausea, vomiting, diarrhea, constipation, dizziness, abdominal pain, skin rash, fevers, chills, night sweats, weight loss, swollen lymph nodes, chest pain, shortness of breath, mood changes.   Objective  There were no vitals taken for this visit.  Systems examined below as of 12/17/16   Systems examined below as of  12/17/16 General: NAD A&O x3 mood, affect normal  HEENT: Pupils equal, extraocular movements intact no nystagmus Respiratory: not short of breath at rest or with speaking Cardiovascular: No lower extremity edema, non tender Skin: Warm dry intact with no signs of infection or rash on extremities or on axial skeleton. Abdomen: Soft nontender, no masses Neuro: Cranial nerves  intact, neurovascularly intact in all extremities with 2+ DTRs and 2+ pulses. Lymph: No lymphadenopathy appreciated today  Gait normal with good balance and coordination. Improvement from previous exam MSK:  Continued to being nontender over the musculature and.rigidity with movement mostly on the right side. Continue mild pill-rolling tremor of the right upper cavity. Back Exam:  Inspection: Unremarkable  Motion: Flexion 35 deg, Extension 20 deg  Side Bending to 35 deg bilaterally,  Rotation to 35 deg bilaterally cogwheeling with range of motion still present and seems to be stable SLR laying: Negative  XSLR laying: Negative   Palpable tenderness: Increased discomfort in the paraspinal musculature of the lumbar spine. FABER: negative. Sensory change: Gross sensation intact to all lumbar and sacral dermatomes.  Reflexes: 2+ at both patellar tendons, 2+ at achilles tendons, Babinski's downgoing.  Strength at foot  Plantar-flexion: 5/5 Dorsi-flexion: 5/5 Eversion: 5/5 Inversion: 5/5  Leg strength   Quad: 5/5 Hamstring: 5/5 Hip flexor: 5/5 Hip abductors: 4+/5   Osteopathic findings C4 flexed rotated and side bent right T3 extended rotated and side bent right inhaled third rib T7  extended rotated and side bent left  L4 flexed rotated and side bent right    Impression and Recommendations:     This case required medical decision making of moderate complexity.      Note: This dictation was prepared with Dragon dictation along with smaller phrase technology. Any transcriptional errors that result from this process  are unintentional.

## 2016-12-20 ENCOUNTER — Ambulatory Visit: Payer: Medicare Other | Admitting: Family Medicine

## 2016-12-26 ENCOUNTER — Other Ambulatory Visit: Payer: Self-pay | Admitting: Neurology

## 2017-01-02 NOTE — Progress Notes (Signed)
[name] [address] [place], [place] [address] Phone: [phone] Subjective:     BJ:YNWGNFA rib syndrome follow-up  OZH:YQMVHQIONG  Ian [name] is a 56 y.o. male coming in with complaint of right-sided back pain. 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 Patient is doing very well with conservative therapy as well as responding well to osteopathic manipulation. Patient last month was given an deep brain stimulator. Patient has not had this done yet. Patient states doing well overall. Patient is coming off of his medications we can have this procedure. States that this causes some mild increase in muscle tightness recently. Mild increase in the tightness around his right shoulder as well as lower back. No radiation down the leg.      Past Medical History:  Diagnosis Date  . PD (Parkinson's disease) (HCC) 09/10/2013   Past Surgical History:  Procedure Laterality Date  . none     Social History   Social History  . Marital status: Married    Spouse name: N/A  . Number of children: N/A  . Years of education: N/A   Social History Main Topics  . Smoking status: Never Smoker  . Smokeless tobacco: Never Used  . Alcohol use Yes     Comment: Occ  . Drug use: Unknown  . Sexual activity: Not on file   Other Topics Concern  . Not on file   Social History Narrative  . No narrative on file   No Known Allergies Family History  Problem Relation Age of Onset  . Diabetes Other   . Cancer Other     Past medical history, social, surgical and family history all reviewed in electronic medical record.  No pertanent information unless stated regarding to the chief complaint.   Review of Systems: No headache, visual changes, nausea, vomiting, diarrhea, constipation, dizziness, abdominal pain, skin rash, fevers, chills, night sweats, weight loss, swollen lymph nodes, body aches, joint swelling, muscle aches, chest pain, shortness  of breath, mood changes.    Objective  There were no vitals taken for this visit.  Systems examined below as of 01/02/17   Systems examined below as of 01/02/17 General: NAD A&O x3 mood, affect normal  HEENT: Pupils equal, extraocular movements intact no nystagmus masked Noted  Respiratory: not short of breath at rest or with speaking Cardiovascular: No lower extremity edema, non tender Skin: Warm dry intact with no signs of infection or rash on extremities or on axial skeleton. Abdomen: Soft nontender, no masses Neuro: Cranial nerves  intact, neurovascularly intact in all extremities with 2+ DTRs and 2+ pulses. Lymph: No lymphadenopathy appreciated today    Gait mild broad-based gait MSK:  Continued to being nontender over the musculature and.rigidity with movement mostly on the right side. Resting tremor noted worse than previous exam. Back Exam:  Inspection: Unremarkable  Motion: Flexion 30 deg, Extension 10 deg  Side Bending to 25 deg bilaterally,  Rotation to 25 deg bilaterally cogwheeling with range of motion still present and seems to be stable SLR laying: Negative  XSLR laying: Negative   Palpable tenderness: Patient does have increasing tightness of the paraspinal musculature around L3-L4 bilaterally. No spinous process tenderness. FABER: negative. Increasing tightness from previous exam Sensory change: Gross sensation intact to all lumbar and sacral dermatomes.  Reflexes: 2+ at both patellar tendons, 2+ at achilles tendons, Babinski's downgoing.  Strength at foot  Plantar-flexion: 5/5 Dorsi-flexion: 5/5 Eversion: 5/5 Inversion: 5/5  Leg  strength   Quad: 5/5 Hamstring: 5/5 Hip flexor: 5/5 Hip abductors: 4+/5 but symmetric  Osteopathic findings Cervical C2 flexed rotated and side bent right C4 flexed rotated and side bent left C6 flexed rotated and side bent left T3 extended rotated and side bent right inhaled third rib T9 extended rotated and side bent left L2 flexed  rotated and side bent right Sacrum right on right     Impression and Recommendations:     This case required medical decision making of moderate complexity.      Note: This dictation was prepared with Dragon dictation along with smaller phrase technology. Any transcriptional errors that result from this process are unintentional.

## 2017-01-03 ENCOUNTER — Encounter: Payer: Self-pay | Admitting: Family Medicine

## 2017-01-03 ENCOUNTER — Ambulatory Visit (INDEPENDENT_AMBULATORY_CARE_PROVIDER_SITE_OTHER): Payer: Medicare Other | Admitting: Family Medicine

## 2017-01-03 VITALS — BP 126/72 | HR 70 | Ht 74.0 in | Wt 166.0 lb

## 2017-01-03 DIAGNOSIS — G20 Parkinson's disease: Secondary | ICD-10-CM | POA: Diagnosis not present

## 2017-01-03 DIAGNOSIS — M940 Chondrocostal junction syndrome [Tietze]: Secondary | ICD-10-CM | POA: Diagnosis not present

## 2017-01-03 DIAGNOSIS — M999 Biomechanical lesion, unspecified: Secondary | ICD-10-CM | POA: Diagnosis not present

## 2017-01-03 NOTE — Assessment & Plan Note (Signed)
Increasing tenderness noted today. Likely secondary to patient's Parkinson's disease that is contribute in. Patient is change in medications we can have the deep brain stimulator placed on the 13th. We discussed with patient seen him one more time before this. After that patient will need to wait 2 weeks after the last procedure. We discussed icing regimen and home exercises. Patient will come back and see me again in 3 weeks.

## 2017-01-03 NOTE — Assessment & Plan Note (Signed)
Decision today to treat with OMT was based on Physical Exam  After verbal consent patient was treated with HVLA, ME, FPR techniques in  Cervical, thoracic, rib lumbar Patient tolerated the procedure well with improvement in symptoms  Patient given exercises, stretches and lifestyle modifications  See medications in patient instructions if given  Patient will follow up in 3-4 weeks            

## 2017-01-03 NOTE — Patient Instructions (Addendum)
Good to see you You are doing great overall.  Keep it up  Lets see you one more time before the placement of the stimulator.  May need at least 2 weeks after the surgery before we do another one.  Otherwise you know the drill and see you again in 3-4 weeks.

## 2017-01-10 ENCOUNTER — Encounter: Payer: Self-pay | Admitting: Neurology

## 2017-01-10 ENCOUNTER — Other Ambulatory Visit: Payer: Self-pay | Admitting: Neurology

## 2017-01-12 NOTE — Progress Notes (Signed)
Ian [name] was seen today in the movement disorders clinic for neurologic consultation at the request of his PCP, Dr. Drue [name].  He has previously seen  Ian,[name], MD and saw Dr. Edwin [name] in [place].   This patient is accompanied in the office by his spouse who supplements the history.The consultation is for the evaluation of PD.  I did review Dr. Carloyn [name]'s notes.  Dr. Hurley [name]'s notes are not available to me.    Hist first sx was tremor was R arm tremor.  It was in 2011 according to the patient today, but Dr. Faye [name]'s notes indicate that perhaps this was in 2009.  He was dx with enhanced physiologic tremor.  He was started on propranolol with some help, but made him sluggish.  He went to see Dr. Edwin [name] in 2013 for an opinion in Ian [name].  Dr. 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  He was placed on baclofen and requip.  He thinks that the requip has helped; he is able to turn over in the bed better and move better.  The pt first saw Dr. Rubin [name] on 09/10/13.  Dr. Rubin [name] agreed with the Requip and start the patient on [name], but the patient is currently only on its once per day.  12/04/13 update:  The pt presents to the clinic today for his PD.  He is exercising faithfully.  He attends the PD exercise class through the rehab center. The pt was changed to the IR formulation of carbidopa/levodopa 25/100 last visit and is on it tid.  He is not sure it helps.  Afternoons are better than mornings.  carbidopa/levodopa 25/100 is taken at 8am/3:30 pm and bedtime. He is also on requip xl 6 mg daily.  Continues to have significant tremor.  01/27/14 update:  The patient presents today with his wife, who supplements the history.  The pt has a hx of PD and last visit, we decided to increase his carbidopa/levodopa to 2 po tid.  the patient states that he just felt lightheaded on this medication dosage  and up going back to [name] 3 times per day and then takes [name], half tablet twice per day. He remains on requip xl 6 mg daily.  Overall, he does feel fairly good.  He still has tremor.  He went to see Dr. Rubin [name] since our last visit and again talked about DBS therapy.  Dr. Rubin [name] mentioned to him that he looks much better and he had previously and had Ian [name] score had declined.  Some yelling out in the sleep but this has not been a huge issue.  No sleepwalking/talking.  No falls.  He is exercising faithfully.  05/28/14 update:  The patient is presenting today for followup.  Last visit, I increased his [name] from 6 mg daily to 4 mg twice a day.  He is doing well with this.  Tremor is about the same.  He is on a strange combination of carbidopa levodopa, but when I tried to switch him off the IR version, the patient just felt lightheaded and ended up going back to a combination of IR and CR.  He is currently on 25/100 CR 3 times per day and then takes [name], half tablet twice per day and sometimes three times per day.  He does best in the evening and worst in the AM.   No swallowing troubles.  Has  dropped weight but thinks that it is exercise and was trying to follow gluten free diet. He is walking, doing a spinning class for PD, and has started golfing again, which he hasn't done in years.   Still considering DBS therapy.    08/27/14 update:  Patient presents today for followup.  He is currently on [name], 4mg  twice a day.  He is on a strange combination of carbidopa levodopa, but hasn't been able to tolerate the IR version well.[name].  He is currently on 25/100 CR 2-3 times per day and then 50/200 at night.  He rarely takes the IR now; only if playing golf or if he needs a boost during the day.  He is finding that he is having more tremor.  He is also having dreams at night that are making his sleep feel unrestful.  He is strongly  considering deep brain stimulation.  He asks me about details regarding this today.  He has asks me about potentially getting a refill on his Ian [name].  He apparently got this filled from an outside physician about a year and a half ago and only had 10 pills in total and is just now running out.  He takes one pill every 3 or 4 months if he has to go to a function with his wife.  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  He asks me if he can have a refill today.  09/10/14 update:  The patient is seen today unexpectedly.  Last visit, I started [name] 145 mg, 2 tablets 3 times per day.  He is also on [name] 4 mg twice a day.  He called me initially to tell me that the [name] was working great and he felt much better on the medication.  In fact, he states that his PT and OT reported that he looked much better and his eval scores were markedly improved and they asked him what he had done differently.  He states last week was just a great week.  On Saturday, he was just a little lightheaded and his wife checked his orthostatics and he states that they were negative.  This week, however, he began to report some abnormal movements.  The movements were primarily of the left shoulder and in the abdomen. I wanted to see him on this medication before added or changed anything, as I have never seen him with dyskinesia.  Unfortunately, this morning he only took one of his tablets, but he did take 2 tablets at lunch time about 1 hour before coming.  He has not noticed the movements today.  He has noticed much less tremor overall since starting [name].  11/25/14 update:  The patient returns today for follow-up.  He is on [name] 4 mg twice a day and [name] 145 mg, 2 tablets 3 times per day.  His biggest issue is tremor, but states that its under good control today (but he just exercised).  He has developed tendinitis in the elbow.  He has clonazepam for REM behavior  disorder but admits he doesn't use it.  The patient remains very active in our Parkinson's exercise classes.  No falls.  Some days he does have to "fight" the posture and the "shuffling."  He didn't ever get to see Dr. Venetia MaxonStern as he states that his life got hectic moving his mother in law.    02/20/15 update:  The patient returns today, accompanied by his wife  who supplements the history.  Last visit, I increased his [name] to 195 mg, and he is taking 2 tablets 3 times a day.  He is also on [name] 4 mg twice a day.  He reports that he has not been doing as well because stress levels have been increased.  His mother-in-law, who is living with them, died unexpectedly.  Because of that, he has had more trouble sleeping as well.  He is still faithfully attending exercise classes and is involved in the Parkinson's community.  He has noticed more rigidity and tremor.  He is taking melatonin, 5 Mill grams at night to help him sleep but does not want to use clonazepam, even though he has at home.  05/24/25 update:  The patient returns today for follow-up.  His wife accompanies him and supplements the history.  He is on [name] 4 mg twice a day and [name] 195 mg, 2 tablets 3 times per day.  He has not wanted to increase the dose, although he likely could use more levodopa. States that he is more tremulous right now and attributes that to the fact that he has had a GI illness recently, which made sx's worse.  He has had emesis (one episode).  No diarrhea.  No fevers.    He has not had any falls.  He denies any hallucinations.  No confusion.  No lightheadedness or near syncope.  He continues to exercise faithfully.  06/12/15 update:  The patient is following up today, accompanied by his wife who supplements the history.  When I saw him last on 05/22/15, he was obviously underdosed and I expressed the need for more levodopa but the pt didn't want to increase it as he felt that his viral gastroenteritis was just causing his  worsening sx's.  He didn't tell me then that he had been off of his meds for 3 days and that is the reason he didn't look well.  They called me on 06/10/2015 however to state that he was doing much worse.  They wanted me to work him in that day, but I was not able to.  I didn't realize that he had been off all his PD meds since June 1 and had started receiving care from [place] natureopath in [place], Dr. Beckie BusingUrban.  He was given information on Dr. Beckie BusingUrban by his dentist Dr. Mellody DanceKeith, who told him he should try amino acid therapy.  He researched and started seeing Dr. Beckie BusingUrban and was told he needed to be off all his PD meds.  He started on D5-mucuna which markets itself as a "safe, natural" levodopa therapy.  He was told that he needed 4-6 months to see a response, but admits that he was told to tell his doctor what he was doing.  He was increasing the dose of this supplement based on urine dopamine levels which they were told were low.  They apparently were also measuring urine serotonin levels.  Not surprisingly, the patient decompensated.  He states that initially he didn't "feel that bad" but the "tremor was awful."  He also states that he had terrible vivid dreams and ultimately became exhausted from not sleeping, shaking and increased rigidity. It got so bad that he ended up in the ER 2 days ago.    He is back on rytary 195, 2 po tid.  He is a little quesy since being back on it.  He has only been on it for 2 days.  He states that he  talked with the natureopath and she told him to just "start back on it."  He is off of the D5-mucuna.  He and his wife bring in several articles for me to read today.    09/29/15 update:  The patient is following up today, accompanied by his wife who supplements the history.  He is on [name] 4 mg twice a day.  He reports that he is now taking  [name] 195 mg, 2 tablets 3 times a day along with [name] 145 mg, one tablet 3 times a day.  He spreads it out so that he takes it at 8 AM/3 PM/11 PM.   He does not necessarily halfway he describes as "traditional wearing off" but states that at the end of the dose he just does not feel good.  Overall, the patient states that he has been doing okay but he also feels that things are more unpredictable.  He feels more tired and thinks that he has mild dyskinesia.  When he eats, he is biting his lip in the same place.   He denies any lightheadedness or near syncope.  He denies falls.  When asked about depression, he states that he thinks "depression is a strong word" but admits that he gets frustrated and overwhelmed about the disease but uses exercise as a release.  He has sent me paperwork since last visit for his disability which has been completed.  He asks me about writing another letter regarding his VA disability as well.  12/30/15 update:  The patient is following up today.  Last visit, I increased his [name] to 6 mg twice a day.  His [name] has been slightly increased, so that he is on 195 mg, 3 tablets 3 times per day and he added an additional 1 tablet at night because he was awakening at 4 AM with tremor. He has changed that again, but taking the same overall dose of rytary, but is now taking 195, 3 in the AM, 2 in the afternoon, 3 in the evening and then takes 2 at bedtime.   Overall, he thinks he feels that it is a little inconsistent; sometimes he does well and sometimes he doesn't.  He continues to faithfully exercise.  No falls.  No hallucinations.  No syncope but has had some lightheadedness; he has had a uri and isn't sure if it is related to that/meds for that.  Mood has been good.  Sleep has been better since he has been back on his medications.  Appetite has been good.  No new medical issues.  He is still going to integrative therapies and thinks that it is helpful.  However, he did some therapy called alpha stim and was told that it changed the alpha waves in the brain and he didn't think that it was helpful and thinks that it made him feel  strange.  Feels that he has minor dyskinesia, especially of the arm when he walks or bends over.  Asks about DBS therapy and thinks he is going to consider that more this year.    03/03/16 update:  The patient is following up today.  He is on [name], 6 mg twice a day and [name] 195 mg on the following schedule: 3/3/3/2.  This is a little higher than last visit (prior 3/2/3/2).  He denies any falls since last visit.  He denies lightheadedness or near syncope.  No hallucinations.  Tremor has been pretty good, but it will fluctuate throughout the day (  states that he did 2 workouts today).  He does occasionally feel like he has some dyskinesia.  He saw Dr. [name] on February 6 and March 13 and I reviewed those records.  OMT was performed.  The patient feels that that was helpful for his musculoskeletal complaints.  He is doing lots of exercises.  Mood is stable.  Asks about meeting with Dr. Venetia Maxon for DBS but wife not ready to pursue.    06/24/16 update:  The patient is following up today, accompanied by his wife who supplements the history.    He is here for her levodopa challenge test.  He saw Dr. Venetia Maxon in May and is interested in DBS therapy.  Last visit, I increased his [name] so that he is taking 4 mg, 2 tablets twice a day.  He remains on [name] 195 mg on the following schedule: 3/3/3/2.  He takes clonazepam as needed for REM behavior disorder.  He has not had falls since last visit.  He has seen Dr. [name] several times and I reviewed those records.  He has not had any lightheadedness or near syncope.  He continues to exercise faithfully.  10/18/16 update:  Pt f/u today.  Wife doesn't accompany today.  He saw Dr. Alinda Dooms since our last visit and no evidence of cognitive deficits.  Pt on [name] 4 mg - 2 po bid and rytary 195 mg: and he has changed that from 3/3/3/2 to 4/3/3/3.  He has a little more off time with more tremor and slowness.  In late summer, he was putting a putting green into his  back yard and states that he tore an intercostal muscle so he couldn't exercise for 8-9 weeks because of pain with breathing.  Started back to working out 3-4 weeks ago.   01/14/16 update:  Patient follows up today, as he has some questions regarding DBS therapy.  Since our last visit, the [facility] device for deep brain stimulation has been FDA approved.  The patient and I discussed the device over the telephone, and he subsequently read about the device and thought that he would prefer to have this particular device and presents today to talk about it in more detail.  He remains on [name], 4 mg, and takes 2 tablets twice per day.  He also takes [name] 195 mg: 4/3/3/3.  Last visit, I gave him some carbidopa/levodopa 25/100 to use as needed and he states that he tried that but it didn't work well for him and so he isn't using that much.   PREVIOUS MEDICATIONS: Requip and neupro (tried few days only); propranolol helped tremor some  ALLERGIES:  No Known Allergies  CURRENT MEDICATIONS:   Allergies as of 01/13/2017   No Known Allergies     Medication List       Accurate as of 01/13/17  2:03 PM. Always use your most recent med list.          carbidopa-levodopa 25-100 MG tablet Commonly known as:  SINEMET IR take 1 tablet by mouth twice a day   [name] 48.75-195 MG Cpcr Generic drug:  Carbidopa-Levodopa ER take 4 capsules by mouth every morning and 3 capsules three times a day   COQ10 PO Take 100 mg by mouth daily.   D-5000 5000 units Tabs Generic drug:  Cholecalciferol Take 1 tablet by mouth daily.   ibuprofen 400 MG tablet Commonly known as:  ADVIL,MOTRIN Take 1 tablet (400 mg total) by mouth every 6 (six)  hours as needed.   LECITHIN PO Take 1 capsule by mouth daily.   MAGNESIUM PO Take 800 mg by mouth daily.   MULTI-VITAMINS Tabs Take 1 tablet by mouth daily.   niacin 1000 MG CR tablet Commonly known as:  NIASPAN Take 1,000 mg by mouth daily.   rOPINIRole 4  MG 24 hr tablet Commonly known as:  [name] Take 2 tablets (8 mg total) by mouth 2 (two) times daily.   VITAMIN C PO Take 5,000 mg by mouth daily.   vitamin E 400 UNIT capsule Take 400 Units by mouth daily.        PAST MEDICAL HISTORY:   Past Medical History:  Diagnosis Date  . PD (Parkinson's disease) (HCC) 09/10/2013    PAST SURGICAL HISTORY:   Past Surgical History:  Procedure Laterality Date  . none      SOCIAL HISTORY:   Social History   Social History  . Marital status: Married    Spouse name: N/A  . Number of children: N/A  . Years of education: N/A   Occupational History  . Not on file.   Social History Main Topics  . Smoking status: Never Smoker  . Smokeless tobacco: Never Used  . Alcohol use Yes     Comment: Occ  . Drug use: Unknown  . Sexual activity: Not on file   Other Topics Concern  . Not on file   Social History Narrative  . No narrative on file    FAMILY HISTORY:   Family Status  Relation Status  . Mother Deceased at age 65   Breast Cancer  . Father Alive   Diabetes  . Sister Alive   healthy  . Daughter Alive   2, twins  . Son Alive  . Other     ROS: A complete 10 system review of systems was obtained and was unremarkable apart from what is mentioned above.  PHYSICAL EXAMINATION:    VITALS:   Vitals:   01/13/17 1335  BP: 122/64  Pulse: 88  SpO2: 98%  Weight: 172 lb (78 kg)  Height: 6\' 2"  (1.88 m)   Wt Readings from Last 3 Encounters:  01/13/17 172 lb (78 kg)  01/03/17 166 lb (75.3 kg)  11/21/16 170 lb (77.1 kg)    GEN:  The patient appears stated age and is in NAD. HEENT:  Normocephalic, atraumatic.  The mucous membranes are moist. The superficial temporal arteries are without ropiness or tenderness. CV:  RRR Lungs:  CTAB Neck/HEME:  There are no carotid bruits bilaterally.  Neurological examination:  Orientation: The patient is alert and oriented x3. Fund of knowledge is appropriate.  Recent and remote  memory are intact.  Attention and concentration are normal.    Able to name objects and repeat phrases. Cranial nerves: There is good facial symmetry.  There is facial hypomimia.  Pupils are equal round and reactive to light bilaterally. The speech is fluent and clear. Soft palate rises symmetrically and there is no tongue deviation. Hearing is intact to conversational tone. Sensation: Sensation is intact to light touch throughout. Motor: Strength is 5/5 in the bilateral upper and lower extremities.   Shoulder shrug is equal and symmetric.  There is no pronator drift.  Movement Examination: Tone: There is mild-moderate rigidity in the bilateral UE's/LE's Abnormal movements: There is a very mild tremor of the LLE.  Minimal dyskinesia noted in the legs Coordination:  There is near normal rapid alternating movements. Gait and Station: The patient has  no difficulty arising out of a deep-seated chair without the use of the hands.  He has no start hesitation and doesn't turn en bloc. He walks very well  He has a negative pull test.  ASSESSMENT/PLAN:  1. idiopathic Parkinson's disease.  The patient has tremor, bradykinesia, rigidity and mild postural instability.  -continue requip xl  4mg  - 2 po bid  -Now on rytary from 195 4/3/3/3.   -titration schedule for how to wean meds pre-op given to patient  -[employer] rep here for questions to patient.  Explained that at this point this is NOT an MRI compatible device  -I talked to the patient about the logistics associated with DBS therapy.  I talked to the patient about risks/benefits/side effects of DBS therapy.  We talked about risks which included but were not limited to infection, paralysis, intraoperative seizure, death, stroke, bleeding around the electrode.   I talked to patient about fiducial placement 1 week prior to DBS therapy.  I talked to the patient about what to expect in the operating room, including the fact that this is an awake surgery.  We  talked about battery placement as well as which is done under general anesthesia, generally approximately one week following the initial surgery.   The patient and family were given the opportunity to ask questions, which they did, and I answered them to the best of my ability today. 2.  Possible depression   -Managing with exercise and thinks that he is doing fairly well.  3.  REM behavior disorder.  -He has clonazepam but no longer using 4.  Much greater than 50% of this visit was spent in counseling and coordinating care.  Total face to face time:  45 min

## 2017-01-13 ENCOUNTER — Encounter: Payer: Self-pay | Admitting: Neurology

## 2017-01-13 ENCOUNTER — Ambulatory Visit (INDEPENDENT_AMBULATORY_CARE_PROVIDER_SITE_OTHER): Payer: Medicare Other | Admitting: Neurology

## 2017-01-13 VITALS — BP 122/64 | HR 88 | Ht 74.0 in | Wt 172.0 lb

## 2017-01-13 DIAGNOSIS — G20 Parkinson's disease: Secondary | ICD-10-CM

## 2017-01-13 DIAGNOSIS — G4752 REM sleep behavior disorder: Secondary | ICD-10-CM

## 2017-01-13 NOTE — Patient Instructions (Addendum)
On Feb 19, decrease requip to 1 tablet once twice per day On feb 20, decrease requip to 1 tablet once per day On feb 21 stop requip  You can take rytary until the evening of feb 22  I will see you for Feb 13 for pre-op video at noon

## 2017-01-18 ENCOUNTER — Telehealth: Payer: Self-pay | Admitting: Neurology

## 2017-01-18 DIAGNOSIS — Z029 Encounter for administrative examinations, unspecified: Secondary | ICD-10-CM

## 2017-01-18 NOTE — Telephone Encounter (Signed)
Mychart message sent to patient.

## 2017-01-24 ENCOUNTER — Ambulatory Visit (INDEPENDENT_AMBULATORY_CARE_PROVIDER_SITE_OTHER): Payer: Medicare Other | Admitting: Family Medicine

## 2017-01-24 ENCOUNTER — Encounter: Payer: Self-pay | Admitting: Family Medicine

## 2017-01-24 VITALS — BP 100/62 | HR 72 | Ht 74.0 in | Wt 167.0 lb

## 2017-01-24 DIAGNOSIS — R2689 Other abnormalities of gait and mobility: Secondary | ICD-10-CM | POA: Diagnosis not present

## 2017-01-24 DIAGNOSIS — M940 Chondrocostal junction syndrome [Tietze]: Secondary | ICD-10-CM

## 2017-01-24 DIAGNOSIS — M999 Biomechanical lesion, unspecified: Secondary | ICD-10-CM

## 2017-01-24 NOTE — Assessment & Plan Note (Signed)
Decision today to treat with OMT was based on Physical Exam  After verbal consent patient was treated with HVLA, ME, FPR techniques in cervical, thoracic, rib, lumbar and sacral areas  Patient tolerated the procedure well with improvement in symptoms  Patient given exercises, stretches and lifestyle modifications  See medications in patient instructions if given  Patient will follow up in 4-6 weeks 

## 2017-01-24 NOTE — Progress Notes (Signed)
Procedure Note   Patient was fitted for a : standard, cushioned, semi-rigid orthotic. The orthotic was heated and afterward the patient patient seated position and molded The patient was positioned in subtalar neutral position and 10 degrees of ankle dorsiflexion in a weight bearing stance. After completion of molding, patient did have orthotic management The blank was ground to a stable position for weight bearing. Size:11.5 Base: Carbon fiber Additional Posting and Padding:  100/35 posting.  he patient ambulated these, and they were very comfortable.

## 2017-01-24 NOTE — Progress Notes (Signed)
Ian ScaleZach Shereena [name] D.O. [facility] [address] [place], [place] [address] Phone: [phone] Subjective:     BJ:YNWGNFACC:slipped rib syndrome follow-up  OZH:YQMVHQIONGHPI:Subjective  Ian PurserKenneth [name] is a 56 y.o. male coming in with complaint of right-sided back pain. 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 Patient is doing very well with conservative therapy as well as responding well to osteopathic manipulation. Patient is being fitted for a deep brain stimulator. Patient is here for one-month manipulative appointment. States that he is been feeling relatively good overall. Continues to be fairly active. Some mild tightness of the mid back more than anything else today. Mild worsening of previous symptoms but nothing severe.      Past Medical History:  Diagnosis Date  . PD (Parkinson's disease) (HCC) 09/10/2013   Past Surgical History:  Procedure Laterality Date  . none     Social History   Social History  . Marital status: Married    Spouse name: N/A  . Number of children: N/A  . Years of education: N/A   Social History Main Topics  . Smoking status: Never Smoker  . Smokeless tobacco: Never Used  . Alcohol use Yes     Comment: Occ  . Drug use: Unknown  . Sexual activity: Not Asked   Other Topics Concern  . None   Social History Narrative  . None   No Known Allergies Family History  Problem Relation Age of Onset  . Diabetes Other   . Cancer Other     Past medical history, social, surgical and family history all reviewed in electronic medical record.  No pertanent information unless stated regarding to the chief complaint.   Review of Systems: No , visual changes, nausea, vomiting, diarrhea, constipation, dizziness, abdominal pain, skin rash, fevers, chills, night sweats, weight loss, swollen lymph nodes,  joint swelling, chest pain, shortness of breath, mood changes.     Objective  Blood pressure 100/62, pulse 72, height 6\' 2"  (1.88 m), weight 167 lb  (75.8 kg), SpO2 97 %.   Systems examined below as of 01/24/17 General: NAD A&O x3 mood, affect normal Mild masked faces noted HEENT: Pupils equal, extraocular movements intact no nystagmus Respiratory: not short of breath at rest or with speaking Cardiovascular: No lower extremity edema, non tender Skin: Warm dry intact with no signs of infection or rash on extremities or on axial skeleton. Abdomen: Soft nontender, no masses Neuro: Cranial nerves  intact, neurovascularly intact in all extremities with 2+ DTRs and 2+ pulses. Lymph: No lymphadenopathy appreciated today  Gait mild broad-based gait MSK:  Continued to being nontender over the musculature and.rigidity with movement mostly on the right side. Resting tremor noted worse than previous exam. Back Exam:  Inspection: Unremarkable  Motion: Flexion 30 deg, Extension 15 deg  Side Bending to 25 deg bilaterally,  Rotation to 25 deg continues to have cogwheeling of the extremities. SLR laying: Negative  XSLR laying: Negative   Palpable tenderness: More tenderness to palpation in the paraspinal musculature at the thoracolumbar juncture then usual. FABER: negative. Increasing tightness from previous exam Sensory change: Gross sensation intact to all lumbar and sacral dermatomes.  Reflexes: 2+ at both patellar tendons, 2+ at achilles tendons, Babinski's downgoing.  Strength at foot  Symmetric bilaterally. No increasing weakness. Mild increase in rigidity  Osteopathic findings Cervical C2 flexed rotated and side bent right C6 flexed rotated and side bent left T3 extended rotated and side bent right inhaled third rib T7  extended rotated and side bent left L2 flexed rotated and side bent right Sacrum right on right    Impression and Recommendations:     This case required medical decision making of moderate complexity.      Note: This dictation was prepared with Dragon dictation along with smaller phrase technology. Any  transcriptional errors that result from this process are unintentional.

## 2017-01-24 NOTE — Assessment & Plan Note (Signed)
Still believe significant muscle tightness patient continues to have a slipped rib. Still seems to be on the same side. Encourage patient to continue to be active otherwise. Patient does respond well to the manipulation. We will hold on seen patient again until after his surgeries. Patient is in agreement with this plan. Follow-up again in 4-6 weeks.

## 2017-01-24 NOTE — Patient Instructions (Signed)
Good to see you  I will make sure you have a good team for the surgery.  Ice is your friend when needed Will need to wait until at least the 16th for manipulation probably  Call me though if I can help.  I will be thinking of you.

## 2017-01-24 NOTE — Assessment & Plan Note (Signed)
Due to patient's Parkinson's as well as patient having some breakdown of the longitudinal arch. Patient was putting custom orthotics today. Has responded well previously to these. Patient's will start to wear them slowly over the course of next several days. Discussed icing regimen. Discussed proper care. Follow-up again in 4 weeks.

## 2017-01-30 ENCOUNTER — Telehealth: Payer: Self-pay | Admitting: Neurology

## 2017-01-30 NOTE — Telephone Encounter (Signed)
-----   Message from Octaviano Battyebecca S [name], DO sent at 01/30/2017  7:45 AM EST ----- Hold requip tomorrow AM.  Can take 1-2 regular levodopa (not rytary) for the AM to get him through fiducials but then otherwise come here off of med if can, so can do pre-op video and will give him last min instructions on how to taper meds before surgery.  Appt should not too long and he will be able to take his rytary at the end so he can bring it with him

## 2017-01-30 NOTE — Telephone Encounter (Signed)
Mychart message sent to patient.

## 2017-01-30 NOTE — Progress Notes (Signed)
Ian PurserKenneth Wilk was seen today in the movement disorders clinic for neurologic consultation at the request of his PCP, [name].  He has previously seen  [name],[name], [name] in [place].   This patient is accompanied in the office by his spouse who supplements the history.The consultation is for the evaluation of PD.  I did review [name]'s notes.  [name]'s notes are not available to me.    Hist first sx was tremor was R arm tremor.  It was in 2011 according to the patient today, but [name]'s notes indicate that perhaps this was in 2009.  He was dx with enhanced physiologic tremor.  He was started on propranolol with some help, but made him sluggish.  He went to see [name] in 2013 for an opinion in [name].  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  He was placed on baclofen and requip.  He thinks that the requip has helped; he is able to turn over in the bed better and move better.  The pt first saw [name] on 09/10/13.  [name] agreed with the Requip and start the patient on [name], but the patient is currently only on its once per day.  12/04/13 update:  The pt presents to the clinic today for his PD.  He is exercising faithfully.  He attends the PD exercise class through the rehab center. The pt was changed to the IR formulation of carbidopa/levodopa 25/100 last visit and is on it tid.  He is not sure it helps.  Afternoons are better than mornings.  carbidopa/levodopa 25/100 is taken at 8am/3:30 pm and bedtime. He is also on requip xl 6 mg daily.  Continues to have significant tremor.  01/27/14 update:  The patient presents today with his wife, who supplements the history.  The pt has a hx of PD and last visit, we decided to increase his carbidopa/levodopa to 2 po tid.  the patient states that he just felt lightheaded on this medication dosage  and up going back to [name] 3 times per day and then takes [name], half tablet twice per day. He remains on requip xl 6 mg daily.  Overall, he does feel fairly good.  He still has tremor.  He went to see [name] since our last visit and again talked about DBS therapy.  [name] mentioned to him that he looks much better and he had previously and had [name] score had declined.  Some yelling out in the sleep but this has not been a huge issue.  No sleepwalking/talking.  No falls.  He is exercising faithfully.  05/28/14 update:  The patient is presenting today for followup.  Last visit, I increased his [name] from 6 mg daily to 4 mg twice a day.  He is doing well with this.  Tremor is about the same.  He is on a strange combination of carbidopa levodopa, but when I tried to switch him off the IR version, the patient just felt lightheaded and ended up going back to a combination of IR and CR.  He is currently on 25/100 CR 3 times per day and then takes [name], half tablet twice per day and sometimes three times per day.  He does best in the evening and worst in the AM.   No swallowing troubles.  Has  dropped weight but thinks that it is exercise and was trying to follow gluten free diet. He is walking, doing a spinning class for PD, and has started golfing again, which he hasn't done in years.   Still considering DBS therapy.    08/27/14 update:  Patient presents today for followup.  He is currently on [name], 4mg  twice a day.  He is on a strange combination of carbidopa levodopa, but hasn't been able to tolerate the IR version well.[name].  He is currently on 25/100 CR 2-3 times per day and then 50/200 at night.  He rarely takes the IR now; only if playing golf or if he needs a boost during the day.  He is finding that he is having more tremor.  He is also having dreams at night that are making his sleep feel unrestful.  He is strongly  considering deep brain stimulation.  He asks me about details regarding this today.  He has asks me about potentially getting a refill on his Ativan.  He apparently got this filled from an outside physician about a year and a half ago and only had 10 pills in total and is just now running out.  He takes one pill every 3 or 4 months if he has to go to a function with his wife.  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  He asks me if he can have a refill today.  09/10/14 update:  The patient is seen today unexpectedly.  Last visit, I started [name] 145 mg, 2 tablets 3 times per day.  He is also on [name] 4 mg twice a day.  He called me initially to tell me that the [name] was working great and he felt much better on the medication.  In fact, he states that his PT and OT reported that he looked much better and his eval scores were markedly improved and they asked him what he had done differently.  He states last week was just a great week.  On Saturday, he was just a little lightheaded and his wife checked his orthostatics and he states that they were negative.  This week, however, he began to report some abnormal movements.  The movements were primarily of the left shoulder and in the abdomen. I wanted to see him on this medication before added or changed anything, as I have never seen him with dyskinesia.  Unfortunately, this morning he only took one of his tablets, but he did take 2 tablets at lunch time about 1 hour before coming.  He has not noticed the movements today.  He has noticed much less tremor overall since starting [name].  11/25/14 update:  The patient returns today for follow-up.  He is on [name] 4 mg twice a day and [name] 145 mg, 2 tablets 3 times per day.  His biggest issue is tremor, but states that its under good control today (but he just exercised).  He has developed tendinitis in the elbow.  He has clonazepam for REM behavior  disorder but admits he doesn't use it.  The patient remains very active in our Parkinson's exercise classes.  No falls.  Some days he does have to "fight" the posture and the "shuffling."  He didn't ever get to see Dr. Venetia MaxonStern as he states that his life got hectic moving his mother in law.    02/20/15 update:  The patient returns today, accompanied by his wife  who supplements the history.  Last visit, I increased his [name] to 195 mg, and he is taking 2 tablets 3 times a day.  He is also on [name] 4 mg twice a day.  He reports that he has not been doing as well because stress levels have been increased.  His mother-in-law, who is living with them, died unexpectedly.  Because of that, he has had more trouble sleeping as well.  He is still faithfully attending exercise classes and is involved in the Parkinson's community.  He has noticed more rigidity and tremor.  He is taking melatonin, 5 Mill grams at night to help him sleep but does not want to use clonazepam, even though he has at home.  05/24/25 update:  The patient returns today for follow-up.  His wife accompanies him and supplements the history.  He is on [name] 4 mg twice a day and [name] 195 mg, 2 tablets 3 times per day.  He has not wanted to increase the dose, although he likely could use more levodopa. States that he is more tremulous right now and attributes that to the fact that he has had a GI illness recently, which made sx's worse.  He has had emesis (one episode).  No diarrhea.  No fevers.    He has not had any falls.  He denies any hallucinations.  No confusion.  No lightheadedness or near syncope.  He continues to exercise faithfully.  06/12/15 update:  The patient is following up today, accompanied by his wife who supplements the history.  When I saw him last on 05/22/15, he was obviously underdosed and I expressed the need for more levodopa but the pt didn't want to increase it as he felt that his viral gastroenteritis was just causing his  worsening sx's.  He didn't tell me then that he had been off of his meds for 3 days and that is the reason he didn't look well.  They called me on 06/10/2015 however to state that he was doing much worse.  They wanted me to work him in that day, but I was not able to.  I didn't realize that he had been off all his PD meds since June 1 and had started receiving care from a natureopath in [place], Dr. Beckie BusingUrban.  He was given information on Dr. Beckie BusingUrban by his dentist Dr. Mellody DanceKeith, who told him he should try amino acid therapy.  He researched and started seeing Dr. Beckie BusingUrban and was told he needed to be off all his PD meds.  He started on D5-mucuna which markets itself as a "safe, natural" levodopa therapy.  He was told that he needed 4-6 months to see a response, but admits that he was told to tell his doctor what he was doing.  He was increasing the dose of this supplement based on urine dopamine levels which they were told were low.  They apparently were also measuring urine serotonin levels.  Not surprisingly, the patient decompensated.  He states that initially he didn't "feel that bad" but the "tremor was awful."  He also states that he had terrible vivid dreams and ultimately became exhausted from not sleeping, shaking and increased rigidity. It got so bad that he ended up in the ER 2 days ago.    He is back on rytary 195, 2 po tid.  He is a little quesy since being back on it.  He has only been on it for 2 days.  He states that he  talked with the natureopath and she told him to just "start back on it."  He is off of the D5-mucuna.  He and his wife bring in several articles for me to read today.    09/29/15 update:  The patient is following up today, accompanied by his wife who supplements the history.  He is on [name] 4 mg twice a day.  He reports that he is now taking  [name] 195 mg, 2 tablets 3 times a day along with [name] 145 mg, one tablet 3 times a day.  He spreads it out so that he takes it at 8 AM/3 PM/11 PM.   He does not necessarily halfway he describes as "traditional wearing off" but states that at the end of the dose he just does not feel good.  Overall, the patient states that he has been doing okay but he also feels that things are more unpredictable.  He feels more tired and thinks that he has mild dyskinesia.  When he eats, he is biting his lip in the same place.   He denies any lightheadedness or near syncope.  He denies falls.  When asked about depression, he states that he thinks "depression is a strong word" but admits that he gets frustrated and overwhelmed about the disease but uses exercise as a release.  He has sent me paperwork since last visit for his disability which has been completed.  He asks me about writing another letter regarding his VA disability as well.  12/30/15 update:  The patient is following up today.  Last visit, I increased his [name] to 6 mg twice a day.  His [name] has been slightly increased, so that he is on 195 mg, 3 tablets 3 times per day and he added an additional 1 tablet at night because he was awakening at 4 AM with tremor. He has changed that again, but taking the same overall dose of rytary, but is now taking 195, 3 in the AM, 2 in the afternoon, 3 in the evening and then takes 2 at bedtime.   Overall, he thinks he feels that it is a little inconsistent; sometimes he does well and sometimes he doesn't.  He continues to faithfully exercise.  No falls.  No hallucinations.  No syncope but has had some lightheadedness; he has had a uri and isn't sure if it is related to that/meds for that.  Mood has been good.  Sleep has been better since he has been back on his medications.  Appetite has been good.  No new medical issues.  He is still going to integrative therapies and thinks that it is helpful.  However, he did some therapy called alpha stim and was told that it changed the alpha waves in the brain and he didn't think that it was helpful and thinks that it made him feel  strange.  Feels that he has minor dyskinesia, especially of the arm when he walks or bends over.  Asks about DBS therapy and thinks he is going to consider that more this year.    03/03/16 update:  The patient is following up today.  He is on [name], 6 mg twice a day and [name] 195 mg on the following schedule: 3/3/3/2.  This is a little higher than last visit (prior 3/2/3/2).  He denies any falls since last visit.  He denies lightheadedness or near syncope.  No hallucinations.  Tremor has been pretty good, but it will fluctuate throughout the day (  states that he did 2 workouts today).  He does occasionally feel like he has some dyskinesia.  He saw Dr. [name] on February 6 and March 13 and I reviewed those records.  OMT was performed.  The patient feels that that was helpful for his musculoskeletal complaints.  He is doing lots of exercises.  Mood is stable.  Asks about meeting with Dr. Venetia Maxon for DBS but wife not ready to pursue.    06/24/16 update:  The patient is following up today, accompanied by his wife who supplements the history.    He is here for her levodopa challenge test.  He saw Dr. Venetia Maxon in May and is interested in DBS therapy.  Last visit, I increased his [name] so that he is taking 4 mg, 2 tablets twice a day.  He remains on [name] 195 mg on the following schedule: 3/3/3/2.  He takes clonazepam as needed for REM behavior disorder.  He has not had falls since last visit.  He has seen Dr. [name] several times and I reviewed those records.  He has not had any lightheadedness or near syncope.  He continues to exercise faithfully.  10/18/16 update:  Pt f/u today.  Wife doesn't accompany today.  He saw Dr. Alinda Dooms since our last visit and no evidence of cognitive deficits.  Pt on [name] 4 mg - 2 po bid and rytary 195 mg: and he has changed that from 3/3/3/2 to 4/3/3/3.  He has a little more off time with more tremor and slowness.  In late summer, he was putting a putting green into his  back yard and states that he tore an intercostal muscle so he couldn't exercise for 8-9 weeks because of pain with breathing.  Started back to working out 3-4 weeks ago.   01/14/16 update:  Patient follows up today, as he has some questions regarding DBS therapy.  Since our last visit, the [facility] device for deep brain stimulation has been FDA approved.  The patient and I discussed the device over the telephone, and he subsequently read about the device and thought that he would prefer to have this particular device and presents today to talk about it in more detail.  He remains on [name], 4 mg, and takes 2 tablets twice per day.  He also takes [name] 195 mg: 4/3/3/3.  Last visit, I gave him some carbidopa/levodopa 25/100 to use as needed and he states that he tried that but it didn't work well for him and so he isn't using that much.  01/31/17 update:  Patient follows up today for preop DBS recording.  Wife present and supplements history.  He had fiducials placed today.  He was to hold his meds this AM but didn't and feels like he is "on."     PREVIOUS MEDICATIONS: Requip and neupro (tried few days only); propranolol helped tremor some  ALLERGIES:   Allergies  Allergen Reactions  . No Known Allergies     CURRENT MEDICATIONS:   Allergies as of 01/31/2017      Reactions   No Known Allergies       Medication List       Accurate as of 01/31/17  7:52 PM. Always use your most recent med list.          carbidopa-levodopa 25-100 MG tablet Commonly known as:  SINEMET IR take 1 tablet by mouth twice a day   [name] 48.75-195 MG Cpcr Generic drug:  Carbidopa-Levodopa ER take  4 capsules by mouth every morning and 3 capsules three times a day   LECITHIN PO Take 1 capsule by mouth daily.   MULTI-VITAMINS Tabs Take 1 tablet by mouth 2 (two) times daily.   niacin 500 MG tablet Take 500 mg by mouth 3 (three) times daily with meals.   rOPINIRole 4 MG 24 hr tablet Commonly  known as:  [name] Take 2 tablets (8 mg total) by mouth 2 (two) times daily.   vitamin C 1000 MG tablet Take 5,000 mg by mouth daily.   Vitamin D-3 5000 units Tabs Take 5,000 Units by mouth daily.   vitamin E 400 UNIT capsule Take 400 Units by mouth daily.        PAST MEDICAL HISTORY:   Past Medical History:  Diagnosis Date  . PD (Parkinson's disease) (HCC) 09/10/2013    PAST SURGICAL HISTORY:   Past Surgical History:  Procedure Laterality Date  . none      SOCIAL HISTORY:   Social History   Social History  . Marital status: Married    Spouse name: N/A  . Number of children: N/A  . Years of education: N/A   Occupational History  . Not on file.   Social History Main Topics  . Smoking status: Never Smoker  . Smokeless tobacco: Never Used  . Alcohol use Yes     Comment: Occ  . Drug use: Unknown  . Sexual activity: Not on file   Other Topics Concern  . Not on file   Social History Narrative  . No narrative on file    FAMILY HISTORY:   Family Status  Relation Status  . Mother Deceased at age 55   Breast Cancer  . Father Alive   Diabetes  . Sister Alive   healthy  . Daughter Alive   2, twins  . Son Alive  . Other     ROS: A complete 10 system review of systems was obtained and was unremarkable apart from what is mentioned above.  PHYSICAL EXAMINATION:    VITALS:   There were no vitals filed for this visit. Wt Readings from Last 3 Encounters:  01/24/17 167 lb (75.8 kg)  01/13/17 172 lb (78 kg)  01/03/17 166 lb (75.3 kg)    GEN:  The patient appears stated age and is in NAD. HEENT:  Normocephalic.  Fiducials in place.  Bandaids over fiducial areas.  Neurological examination:  Orientation: The patient is alert and oriented x3.  Cranial nerves: There is good facial symmetry. The speech is fluent and clear. Soft palate rises symmetrically and there is no tongue deviation. Hearing is intact to conversational tone. Sensation: Sensation is  intact to light touch throughout.  Movement Examination: Tone: There is good tone Abnormal movements: There is no tremor or dyskinesia today Coordination:  There is near normal rapid alternating movements. Gait and Station: The patient has no difficulty arising out of a deep-seated chair without the use of the hands.  He has no start hesitation and doesn't turn en bloc. He walks very well  He has a negative pull test.  ASSESSMENT/PLAN:  1. idiopathic Parkinson's disease.  The patient has tremor, bradykinesia, rigidity and mild postural instability.  -continue requip xl  4mg  - 2 po bid  -Now on rytary from 195 4/3/3/3.   -titration schedule for how to wean meds pre-op given to patient  -Pre-op video could not be completed as patient was "on" today and didn't hold med.    -remaining  questions answered.  -post op appts made  -I talked to the patient about the logistics associated with DBS therapy.  I talked to the patient about risks/benefits/side effects of DBS therapy.  We talked about risks which included but were not limited to infection, paralysis, intraoperative seizure, death, stroke, bleeding around the electrode.   I talked to patient about fiducial placement 1 week prior to DBS therapy.  I talked to the patient about what to expect in the operating room, including the fact that this is an awake surgery.  We talked about battery placement as well as which is done under general anesthesia, generally approximately one week following the initial surgery.   The patient and family were given the opportunity to ask questions, which they did, and I answered them to the best of my ability today. 2.  Possible depression   -Now resolved and feels really well and ready for DBS 3.  REM behavior disorder.  -He has clonazepam but no longer using 4.  Much greater than 50% of this visit was spent in counseling and coordinating care.  Total face to face time:  20 min

## 2017-01-31 ENCOUNTER — Ambulatory Visit (HOSPITAL_COMMUNITY)
Admission: RE | Admit: 2017-01-31 | Discharge: 2017-01-31 | Disposition: A | Discharge status: Home / Self Care | Payer: Medicare Other | Source: Ambulatory Visit | Attending: Neurosurgery | Admitting: Neurosurgery

## 2017-01-31 ENCOUNTER — Encounter (HOSPITAL_COMMUNITY)
Admission: RE | Disposition: A | Discharge status: Home / Self Care | Payer: Self-pay | Source: Ambulatory Visit | Attending: Neurosurgery

## 2017-01-31 ENCOUNTER — Encounter: Payer: Self-pay | Admitting: Neurology

## 2017-01-31 ENCOUNTER — Ambulatory Visit (INDEPENDENT_AMBULATORY_CARE_PROVIDER_SITE_OTHER): Payer: Medicare Other | Admitting: Neurology

## 2017-01-31 DIAGNOSIS — Z79899 Other long term (current) drug therapy: Secondary | ICD-10-CM | POA: Diagnosis not present

## 2017-01-31 DIAGNOSIS — Z01818 Encounter for other preprocedural examination: Secondary | ICD-10-CM | POA: Insufficient documentation

## 2017-01-31 DIAGNOSIS — G20 Parkinson's disease: Secondary | ICD-10-CM

## 2017-01-31 DIAGNOSIS — G20A1 Parkinson's disease without dyskinesia, without mention of fluctuations: Secondary | ICD-10-CM

## 2017-01-31 HISTORY — PX: MINOR PLACEMENT OF FIDUCIAL: SHX6748

## 2017-01-31 SURGERY — MINOR PLACEMENT OF FIDUCIAL
Anesthesia: Choice

## 2017-01-31 MED ORDER — BACITRACIN ZINC 500 UNIT/GM EX OINT
TOPICAL_OINTMENT | CUTANEOUS | Status: AC
  Filled 2017-01-31: qty 28.35

## 2017-01-31 MED ORDER — SODIUM BICARBONATE 4 % IV SOLN
INTRAVENOUS | Status: AC
  Filled 2017-01-31: qty 5

## 2017-01-31 MED ORDER — LIDOCAINE-EPINEPHRINE (PF) 2 %-1:200000 IJ SOLN
INTRAMUSCULAR | Status: AC
  Filled 2017-01-31: qty 20

## 2017-01-31 SURGICAL SUPPLY — 16 items
BANDAGE ADH SHEER 1  50/CT (GAUZE/BANDAGES/DRESSINGS) ×15 IMPLANT
BLADE CLIPPER SURG (BLADE) ×3 IMPLANT
BLADE SURG 15 STRL LF DISP TIS (BLADE) ×1 IMPLANT
BLADE SURG 15 STRL SS (BLADE) ×2
COVER TABLE BACK 60X90 (DRAPES) ×3 IMPLANT
DRAPE HALF SHEET 40X57 (DRAPES) ×3 IMPLANT
DRAPE SHEET LG 3/4 BI-LAMINATE (DRAPES) ×3 IMPLANT
GLOVE BIO SURGEON STRL SZ8 (GLOVE) ×3 IMPLANT
GLOVE ECLIPSE 8.5 STRL (GLOVE) ×3 IMPLANT
NEEDLE HYPO 18GX1.5 BLUNT FILL (NEEDLE) ×3 IMPLANT
NEEDLE HYPO 25X1 1.5 SAFETY (NEEDLE) ×3 IMPLANT
SOLUTION BETADINE 4OZ (MISCELLANEOUS) ×3 IMPLANT
SPONGE GAUZE 4X4 12PLY STER LF (GAUZE/BANDAGES/DRESSINGS) ×3 IMPLANT
STAPLER SKIN PROX WIDE 3.9 (STAPLE) ×3 IMPLANT
SYR CONTROL 10ML LL (SYRINGE) ×3 IMPLANT
TOWEL OR 17X26 10 PK STRL BLUE (TOWEL DISPOSABLE) ×3 IMPLANT

## 2017-01-31 NOTE — Op Note (Signed)
Indications:  Patient has essential tremor and presents for Star Fix Fiducial Placement for upcoming bilateral DBS STN placement.    Procedure:  Patient was brought to the operating room.  His scalp had been shaved.  Areas of planned fiducial placement were marked, scalp was prepped with Duraprep.  Scalp was infiltrated with lidocaine with epinephrine.  Four fiducials were placed according to standard landmarks through stab incisions.  4-0 Nylon sutures were placed and sterile dressings were applied.  Patient tolerated procedure well.  He was taken to recovery.

## 2017-01-31 NOTE — Progress Notes (Signed)
Patient discharged from short stay to radiology with Georgiann CockerBrian Poteat from neurosurgery

## 2017-01-31 NOTE — H&P (Signed)
Patient ID:   [id number] Patient: Ian [name]  Date of Birth: 01/22/1961 Visit Type: Office Visit   Date: 12/07/2016 04:00 PM Provider: Glinda Natzke D. Ellora Varnum MD   This 56 year old male presents for Parkinson's Disease.  History of Present Illness: 1.  Parkinson's Disease    The patient comes in to discuss bilateral STN DBS implantation for Parkinsons.        MEDICATIONS(added, continued or stopped this visit): Started Medication Directions Instruction Stopped   Requip 4 mg tablet take 2 tablet by oral route  every day     [name] 48.75 mg-195 mg capsule,extended release take 1 capsule by oral route 3 times every day       ALLERGIES: Ingredient Reaction Medication Name Comment  NO KNOWN ALLERGIES     No known allergies. Reviewed, no changes.    Vitals Date Temp F BP Pulse Ht In Wt Lb BMI BSA Pain Score  12/07/2016  128/68 89 74 170 21.83  0/10      IMPRESSION The patient has had a few months to think and he is ready to schedule bilateral STN DBS implantation. I discussed the details of surgery in depth with the patient and his wife today and answered all their questions.   Completed Orders (this encounter) Order Details Reason Side Interpretation Result Initial Treatment Date Region  Hypertension education Continue to monitor blood pressure. If remains elevated, contact primary care physician.         Assessment/Plan # Detail Type Description   1. Assessment Elevated blood-pressure reading, w/o diagnosis of htn (R03.0).         Pain Assessment/Treatment Pain Scale: 0/10. Method: Numeric Pain Intensity Scale. Onset: 04/27/1996.  Schedule bilateral STN DBS. Nurse education given.   Orders: Instruction(s)/Education: Assessment Instruction  R03.0 Hypertension education             Provider:  Kamara Allan D. Evani Shrider MD  12/07/2016 04:34 PM Dictation edited by: Samuel F. McBride    CC Providers: [name] [name] [address] [place], [place]  [address]              Electronically signed by Raysean Graumann D. Katelinn Justice MD on 12/11/2016 01:13 PM  Patient ID:   [id number] Patient: Ian [name]  Date of Birth: 04/23/1961 Visit Type: Office Visit   Date: 04/27/2016 10:30 AM Provider: Maze Corniel D. Gabreil Yonkers MD   This 56 year old male presents for Parkinson's Disease.  History of Present Illness: 1.  Parkinson's Disease    [name], 54-year-old retired male, visits on Dr. [name] [name]'s referral to discuss deep brain stimulation for Parkinson's.  He has noticed improvements with [name] 45/195 qid and Requip 4mg 2bid.  Tremor, freezing, and sleep continue to affect him daily. He notes inconsistent symptoms with his current course of medications. Occasional dyskinesias. He states no cognitive issues.   Parkinson's since 2014. 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        PAST MEDICAL/SURGICAL HISTORY   (Detailed)  Disease/disorder Onset Date Management Date Comments  Parkinson disease         PAST MEDICAL HISTORY, SURGICAL HISTORY, FAMILY HISTORY, SOCIAL HISTORY AND REVIEW OF SYSTEMS  04/27/2016, which I have signed.  Family History  (Detailed) Relationship Family Member Name Deceased Age at Death Condition Onset Age Cause of Death  Daughter  N  Alive and well    Father    Diabetes mellitus  N  Mother    breat  N  Sister  N  Alive and well      Son  N  Alive and well      SOCIAL HISTORY  (Detailed) Tobacco use reviewed. Preferred language is Unknown.   Smoking status: Never smoker.  SMOKING STATUS Use Status Type Smoking Status Usage Per Day Years Used Total Pack Years  no/never  Never smoker       HOME ENVIRONMENT/SAFETY The patient has not fallen in the last year.        MEDICATIONS(added, continued or stopped this visit): Started Medication Directions Instruction Stopped   Requip 4 mg tablet take 2 tablet by oral route  every day     [name] 48.75 mg-195 mg capsule,extended release take 1  capsule by oral route 3 times every day       ALLERGIES: Ingredient Reaction Medication Name Comment  NO KNOWN ALLERGIES     No known allergies.    Vitals Date Temp F BP Pulse Ht In Wt Lb BMI BSA Pain Score  04/27/2016  102/64 69 74 172.6 22.16  3/10     PHYSICAL EXAM General Level of Distress: no acute distress Overall Appearance: normal  Head and Face  Right Left  Fundoscopic Exam:  normal normal    Cardiovascular Cardiac: regular rate and rhythm without murmur  Right Left  Carotid Pulses: normal normal  Respiratory Lungs: clear to auscultation  Neurological Orientation: normal Recent and Remote Memory: normal Attention Span and Concentration:   normal Language: normal Fund of Knowledge: normal  Right Left Sensation: normal normal Upper Extremity Coordination: normal normal  Lower Extremity Coordination: normal normal  Musculoskeletal Gait and Station: normal  Right Left Upper Extremity Muscle Strength: normal normal Lower Extremity Muscle Strength: normal normal Upper Extremity Muscle Tone:  normal normal Lower Extremity Muscle Tone: normal normal  Motor Strength Upper and lower extremity motor strength was tested in the clinically pertinent muscles.     Deep Tendon Reflexes  Right Left Biceps: normal normal Triceps: normal normal Brachiloradialis: normal normal Patellar: normal normal Achilles: normal normal  Sensory Sensation was tested at L1 to S1.   Cranial Nerves II. Optic Nerve/Visual Fields: normal III. Oculomotor: normal IV. Trochlear: normal V. Trigeminal: normal VI. Abducens: normal VII. Facial: normal VIII. Acoustic/Vestibular: normal IX. Glossopharyngeal: normal X. Vagus: normal XI. Spinal Accessory: normal XII. Hypoglossal: normal  Motor and other Tests Lhermittes: negative Rhomberg: negative Pronator  drift: absent     Right Left Spurlings negative negative Hoffman's: normal normal Clonus: normal normal Babinski: normal normal SLR: negative negative Patrick's (Faber): negative negative Toe Walk: normal normal Toe Lift: normal normal Heel Walk: normal normal Tinels Elbow: negative negative Tinels Wrist: negative negative SI Joint: nontender nontender Phalen: negative negative   Additional Findings:  cogwheeling rigidty, rest tremor    IMPRESSION The patient is diagnosed with Parkinson's and is interested in proceeding with DBS. We discussed risk and benefits of bilateral deep brain stimulation and surgical procedures. I showed him associated stimulation hardware. I agree with Dr.[name] and believe the patient would be a good candidate for bilateral STN DBS. We discussed potential mid-June or August surgery, he has a family vacation planned in late June.   Pain Assessment/Treatment Pain Scale: 3/10. Method: Numeric Pain Intensity Scale. Location: back. Onset: 04/27/1996. Duration: varies. Quality: discomforting. Pain Assessment/Treatment follow-up plan of care: Patient is taking over the counter pain relievers for relief..  Fall Risk Plan The patient has not fallen in the last year.  Schedule for bilateral STN DBS             Provider:    Devon Kingdon D. Jermani Eberlein MD  04/27/2016 11:45 AM Dictation edited by: Samuel F. McBride    CC Providers: [name] [name] [address] [place], [place] [address]              Electronically signed by Yesena Reaves D. Leotha Westermeyer MD on 04/27/2016 12:12 PM   

## 2017-01-31 NOTE — Patient Instructions (Signed)
On Feb 19, decrease requip to 1 tablet once twice per day On feb 20, decrease requip to 1 tablet once per day On feb 21 stop requip  You can take rytary until the evening of feb 22

## 2017-02-01 ENCOUNTER — Encounter (HOSPITAL_COMMUNITY): Payer: Self-pay | Admitting: Neurosurgery

## 2017-02-09 ENCOUNTER — Encounter (HOSPITAL_COMMUNITY): Payer: Self-pay | Admitting: *Deleted

## 2017-02-09 NOTE — Progress Notes (Signed)
Mr Ian [name] reports that Dr Venetia MaxonStern instructed to stop all medication at 6 pm today.

## 2017-02-10 ENCOUNTER — Inpatient Hospital Stay (HOSPITAL_COMMUNITY): Payer: Medicare Other

## 2017-02-10 ENCOUNTER — Inpatient Hospital Stay (HOSPITAL_COMMUNITY)
Admission: RE | Admit: 2017-02-10 | Discharge: 2017-02-11 | DRG: 027 | Disposition: A | Discharge status: Home / Self Care | Payer: Medicare Other | Source: Ambulatory Visit | Attending: Neurosurgery | Admitting: Neurosurgery

## 2017-02-10 ENCOUNTER — Inpatient Hospital Stay (HOSPITAL_COMMUNITY): Payer: Medicare Other | Admitting: Certified Registered Nurse Anesthetist

## 2017-02-10 ENCOUNTER — Encounter (HOSPITAL_COMMUNITY)
Admission: RE | Disposition: A | Discharge status: Home / Self Care | Payer: Self-pay | Source: Ambulatory Visit | Attending: Neurosurgery

## 2017-02-10 ENCOUNTER — Encounter (HOSPITAL_COMMUNITY): Payer: Self-pay

## 2017-02-10 ENCOUNTER — Other Ambulatory Visit: Payer: Self-pay | Admitting: Neurology

## 2017-02-10 DIAGNOSIS — M1990 Unspecified osteoarthritis, unspecified site: Secondary | ICD-10-CM | POA: Diagnosis present

## 2017-02-10 DIAGNOSIS — Z9689 Presence of other specified functional implants: Secondary | ICD-10-CM

## 2017-02-10 DIAGNOSIS — G20 Parkinson's disease: Secondary | ICD-10-CM | POA: Diagnosis present

## 2017-02-10 DIAGNOSIS — Z79899 Other long term (current) drug therapy: Secondary | ICD-10-CM | POA: Diagnosis not present

## 2017-02-10 HISTORY — DX: Personal history of urinary calculi: Z87.442

## 2017-02-10 HISTORY — PX: SUBTHALAMIC STIMULATOR INSERTION: SHX5375

## 2017-02-10 LAB — BASIC METABOLIC PANEL
Anion gap: 8 (ref 5–15)
BUN: 19 mg/dL (ref 6–20)
CHLORIDE: 106 mmol/L (ref 101–111)
CO2: 25 mmol/L (ref 22–32)
CREATININE: 0.9 mg/dL (ref 0.61–1.24)
Calcium: 9.3 mg/dL (ref 8.9–10.3)
GFR calc Af Amer: >60 mL/min (ref >60)
GFR calc non Af Amer: >60 mL/min (ref >60)
GLUCOSE: 93 mg/dL (ref 65–99)
POTASSIUM: 3.9 mmol/L (ref 3.5–5.1)
Sodium: 139 mmol/L (ref 135–145)

## 2017-02-10 LAB — CBC
HCT: 45.6 % (ref 39.0–52.0)
HEMOGLOBIN: 15.1 g/dL (ref 13.0–17.0)
MCH: 29.7 pg (ref 26.0–34.0)
MCHC: 33.1 g/dL (ref 30.0–36.0)
MCV: 89.6 fL (ref 78.0–100.0)
Platelets: 248 10*3/uL (ref 150–400)
RBC: 5.09 MIL/uL (ref 4.22–5.81)
RDW: 13.7 % (ref 11.5–15.5)
WBC: 6.8 10*3/uL (ref 4.0–10.5)

## 2017-02-10 SURGERY — SUBTHALAMIC STIMULATOR INSERTION
Anesthesia: Monitor Anesthesia Care | Site: Head | Laterality: Bilateral

## 2017-02-10 MED ORDER — OXYCODONE-ACETAMINOPHEN 5-325 MG PO TABS
1.0000 | ORAL_TABLET | ORAL | Status: DC | PRN
  Administered 2017-02-10 (×2): 2 via ORAL
  Filled 2017-02-10 (×2): qty 2

## 2017-02-10 MED ORDER — PHENOL 1.4 % MT LIQD: 1.0000 | OROMUCOSAL | Status: DC | PRN

## 2017-02-10 MED ORDER — FENTANYL CITRATE (PF) 100 MCG/2ML IJ SOLN
INTRAMUSCULAR | Status: AC
  Filled 2017-02-10: qty 2

## 2017-02-10 MED ORDER — CARBIDOPA-LEVODOPA ER 48.75-195 MG PO CPCR
3.0000 | ORAL_CAPSULE | Freq: Three times a day (TID) | ORAL | Status: DC
  Administered 2017-02-10 (×2): 3 via ORAL
  Filled 2017-02-10 (×2): qty 3

## 2017-02-10 MED ORDER — MORPHINE SULFATE (PF) 4 MG/ML IV SOLN
2.0000 mg | INTRAVENOUS | Status: DC | PRN
  Administered 2017-02-10: 2 mg via INTRAVENOUS
  Filled 2017-02-10: qty 1

## 2017-02-10 MED ORDER — CARBIDOPA-LEVODOPA 25-100 MG PO TABS
1.0000 | ORAL_TABLET | Freq: Two times a day (BID) | ORAL | Status: DC | PRN
  Filled 2017-02-10 (×2): qty 1

## 2017-02-10 MED ORDER — POLYETHYLENE GLYCOL 3350 17 G PO PACK: 17.0000 g | PACK | Freq: Every day | ORAL | Status: DC | PRN

## 2017-02-10 MED ORDER — LECITHIN PO GRAN: 1.0000 | GRANULES | Freq: Every day | ORAL | Status: DC

## 2017-02-10 MED ORDER — ZOLPIDEM TARTRATE 5 MG PO TABS: 5.0000 mg | ORAL_TABLET | Freq: Every evening | ORAL | Status: DC | PRN

## 2017-02-10 MED ORDER — BISACODYL 10 MG RE SUPP: 10.0000 mg | Freq: Every day | RECTAL | Status: DC | PRN

## 2017-02-10 MED ORDER — LIDOCAINE-EPINEPHRINE 2 %-1:100000 IJ SOLN
INTRAMUSCULAR | Status: AC
  Filled 2017-02-10: qty 2

## 2017-02-10 MED ORDER — CHLORHEXIDINE GLUCONATE CLOTH 2 % EX PADS: 6.0000 | MEDICATED_PAD | Freq: Once | CUTANEOUS | Status: DC

## 2017-02-10 MED ORDER — VITAMIN C 500 MG PO TABS
5000.0000 mg | ORAL_TABLET | Freq: Every day | ORAL | Status: DC
  Filled 2017-02-10: qty 10

## 2017-02-10 MED ORDER — PROPOFOL 10 MG/ML IV BOLUS
INTRAVENOUS | Status: DC | PRN
  Administered 2017-02-10: 20 mg via INTRAVENOUS
  Administered 2017-02-10: 10 mg via INTRAVENOUS

## 2017-02-10 MED ORDER — FENTANYL CITRATE (PF) 100 MCG/2ML IJ SOLN: 25.0000 ug | INTRAMUSCULAR | Status: DC | PRN

## 2017-02-10 MED ORDER — CARBIDOPA-LEVODOPA 25-100 MG PO TABS
1.0000 | ORAL_TABLET | Freq: Two times a day (BID) | ORAL | Status: DC
  Filled 2017-02-10: qty 1

## 2017-02-10 MED ORDER — BUPIVACAINE HCL 0.5 % IJ SOLN
INTRAMUSCULAR | Status: DC | PRN
  Administered 2017-02-10: 27 mL

## 2017-02-10 MED ORDER — ROPINIROLE HCL ER 8 MG PO TB24
8.0000 mg | ORAL_TABLET | Freq: Two times a day (BID) | ORAL | Status: DC
  Filled 2017-02-10: qty 1

## 2017-02-10 MED ORDER — ACETAMINOPHEN 650 MG RE SUPP: 650.0000 mg | Freq: Four times a day (QID) | RECTAL | Status: DC | PRN

## 2017-02-10 MED ORDER — DOCUSATE SODIUM 100 MG PO CAPS
100.0000 mg | ORAL_CAPSULE | Freq: Two times a day (BID) | ORAL | Status: DC
  Administered 2017-02-10 (×2): 100 mg via ORAL
  Filled 2017-02-10: qty 1

## 2017-02-10 MED ORDER — KCL IN DEXTROSE-NACL 20-5-0.45 MEQ/L-%-% IV SOLN
INTRAVENOUS | Status: DC
  Administered 2017-02-10: 15:00:00 via INTRAVENOUS

## 2017-02-10 MED ORDER — ROPINIROLE HCL ER 8 MG PO TB24
8.0000 mg | ORAL_TABLET | Freq: Two times a day (BID) | ORAL | Status: DC
  Administered 2017-02-10 (×2): 4 mg via ORAL
  Filled 2017-02-10 (×3): qty 1

## 2017-02-10 MED ORDER — HYDROCODONE-ACETAMINOPHEN 5-325 MG PO TABS
1.0000 | ORAL_TABLET | ORAL | Status: DC | PRN
  Administered 2017-02-11: 2 via ORAL
  Filled 2017-02-10: qty 2

## 2017-02-10 MED ORDER — CEFAZOLIN SODIUM-DEXTROSE 2-4 GM/100ML-% IV SOLN
2.0000 g | Freq: Three times a day (TID) | INTRAVENOUS | Status: AC
  Administered 2017-02-10 (×2): 2 g via INTRAVENOUS
  Filled 2017-02-10 (×2): qty 100

## 2017-02-10 MED ORDER — MENTHOL 3 MG MT LOZG: 1.0000 | LOZENGE | OROMUCOSAL | Status: DC | PRN

## 2017-02-10 MED ORDER — ONDANSETRON HCL 4 MG/2ML IJ SOLN
4.0000 mg | INTRAMUSCULAR | Status: DC | PRN
  Administered 2017-02-10: 4 mg via INTRAVENOUS
  Filled 2017-02-10: qty 2

## 2017-02-10 MED ORDER — VITAMIN D3 25 MCG (1000 UNIT) PO TABS
5000.0000 [IU] | ORAL_TABLET | Freq: Every day | ORAL | Status: DC
  Filled 2017-02-10: qty 5

## 2017-02-10 MED ORDER — HYDROCODONE-ACETAMINOPHEN 5-325 MG PO TABS: 1.0000 | ORAL_TABLET | Freq: Four times a day (QID) | ORAL | Status: DC | PRN

## 2017-02-10 MED ORDER — LACTATED RINGERS IV SOLN
INTRAVENOUS | Status: DC | PRN
  Administered 2017-02-10: 07:00:00 via INTRAVENOUS

## 2017-02-10 MED ORDER — CEFAZOLIN SODIUM-DEXTROSE 2-4 GM/100ML-% IV SOLN
2.0000 g | INTRAVENOUS | Status: AC
  Administered 2017-02-10: 2 g via INTRAVENOUS

## 2017-02-10 MED ORDER — SODIUM BICARBONATE 4 % IV SOLN
INTRAVENOUS | Status: DC | PRN
  Administered 2017-02-10: 6 mL

## 2017-02-10 MED ORDER — SODIUM CHLORIDE 0.9% FLUSH: 3.0000 mL | INTRAVENOUS | Status: DC | PRN

## 2017-02-10 MED ORDER — SODIUM BICARBONATE 4 % IV SOLN
INTRAVENOUS | Status: AC
  Filled 2017-02-10: qty 10

## 2017-02-10 MED ORDER — CARBIDOPA-LEVODOPA ER 48.75-195 MG PO CPCR
4.0000 | ORAL_CAPSULE | Freq: Every day | ORAL | Status: DC
  Administered 2017-02-11: 4 via ORAL
  Filled 2017-02-10: qty 4

## 2017-02-10 MED ORDER — VITAMIN E 180 MG (400 UNIT) PO CAPS
400.0000 [IU] | ORAL_CAPSULE | Freq: Every day | ORAL | Status: DC
  Filled 2017-02-10: qty 1

## 2017-02-10 MED ORDER — PROPOFOL 10 MG/ML IV BOLUS
INTRAVENOUS | Status: AC
  Filled 2017-02-10: qty 20

## 2017-02-10 MED ORDER — BACITRACIN ZINC 500 UNIT/GM EX OINT
TOPICAL_OINTMENT | CUTANEOUS | Status: AC
  Filled 2017-02-10: qty 28.35

## 2017-02-10 MED ORDER — THROMBIN 5000 UNITS EX SOLR
OROMUCOSAL | Status: DC | PRN
  Administered 2017-02-10: 09:00:00 via TOPICAL

## 2017-02-10 MED ORDER — 0.9 % SODIUM CHLORIDE (POUR BTL) OPTIME
TOPICAL | Status: DC | PRN
  Administered 2017-02-10: 1000 mL

## 2017-02-10 MED ORDER — LIDOCAINE-EPINEPHRINE 1 %-1:100000 IJ SOLN
INTRAMUSCULAR | Status: DC | PRN
  Administered 2017-02-10: 27 mL

## 2017-02-10 MED ORDER — ACETAMINOPHEN 325 MG PO TABS: 650.0000 mg | ORAL_TABLET | Freq: Four times a day (QID) | ORAL | Status: DC | PRN

## 2017-02-10 MED ORDER — MIDAZOLAM HCL 2 MG/2ML IJ SOLN
INTRAMUSCULAR | Status: DC | PRN
  Administered 2017-02-10: 2 mg via INTRAVENOUS

## 2017-02-10 MED ORDER — SODIUM BICARBONATE 4 % IV SOLN
INTRAVENOUS | Status: AC
  Filled 2017-02-10: qty 5

## 2017-02-10 MED ORDER — SODIUM CHLORIDE 0.9% FLUSH: 3.0000 mL | Freq: Two times a day (BID) | INTRAVENOUS | Status: DC

## 2017-02-10 MED ORDER — ACETAMINOPHEN 650 MG RE SUPP: 650.0000 mg | RECTAL | Status: DC | PRN

## 2017-02-10 MED ORDER — FENTANYL CITRATE (PF) 100 MCG/2ML IJ SOLN
INTRAMUSCULAR | Status: DC | PRN
  Administered 2017-02-10 (×4): 25 ug via INTRAVENOUS

## 2017-02-10 MED ORDER — BUPIVACAINE HCL (PF) 0.5 % IJ SOLN
INTRAMUSCULAR | Status: AC
Start: 2017-02-10 — End: 2017-02-10
  Filled 2017-02-10: qty 30

## 2017-02-10 MED ORDER — ADULT MULTIVITAMIN W/MINERALS CH
1.0000 | ORAL_TABLET | Freq: Every day | ORAL | Status: DC
  Administered 2017-02-10: 1 via ORAL

## 2017-02-10 MED ORDER — MIDAZOLAM HCL 2 MG/2ML IJ SOLN
INTRAMUSCULAR | Status: AC
  Filled 2017-02-10: qty 2

## 2017-02-10 MED ORDER — CEFAZOLIN SODIUM-DEXTROSE 2-4 GM/100ML-% IV SOLN
2.0000 g | INTRAVENOUS | Status: DC
  Filled 2017-02-10: qty 100

## 2017-02-10 MED ORDER — HEMOSTATIC AGENTS (NO CHARGE) OPTIME
TOPICAL | Status: DC | PRN
Start: 2017-02-10 — End: 2017-02-10
  Administered 2017-02-10 (×2): 1 via TOPICAL

## 2017-02-10 MED ORDER — NIACIN 500 MG PO TABS
500.0000 mg | ORAL_TABLET | Freq: Three times a day (TID) | ORAL | Status: DC
  Administered 2017-02-11: 500 mg via ORAL
  Filled 2017-02-10 (×3): qty 1

## 2017-02-10 MED ORDER — BACITRACIN ZINC 500 UNIT/GM EX OINT
TOPICAL_OINTMENT | CUTANEOUS | Status: DC | PRN
  Administered 2017-02-10: 1 via TOPICAL

## 2017-02-10 MED ORDER — ACETAMINOPHEN 325 MG PO TABS
650.0000 mg | ORAL_TABLET | ORAL | Status: DC | PRN
  Administered 2017-02-11: 650 mg via ORAL
  Filled 2017-02-10: qty 2

## 2017-02-10 MED ORDER — LIDOCAINE-EPINEPHRINE 2 %-1:100000 IJ SOLN
INTRAMUSCULAR | Status: AC
  Filled 2017-02-10: qty 1

## 2017-02-10 MED ORDER — SODIUM CHLORIDE 0.9 % IV SOLN: 250.0000 mL | INTRAVENOUS | Status: DC

## 2017-02-10 MED ORDER — THROMBIN 5000 UNITS EX SOLR
CUTANEOUS | Status: AC
  Filled 2017-02-10: qty 5000

## 2017-02-10 MED ORDER — THROMBIN 5000 UNITS EX SOLR
CUTANEOUS | Status: AC
  Filled 2017-02-10: qty 15000

## 2017-02-10 SURGICAL SUPPLY — 79 items
BANDAGE ADH SHEER 1  50/CT (GAUZE/BANDAGES/DRESSINGS) IMPLANT
BIT DRILL NEURO 2X3.1 SFT TUCH (MISCELLANEOUS) IMPLANT
BLADE CLIPPER SURG (BLADE) ×3 IMPLANT
BLADE SURG 11 STRL SS (BLADE) ×6 IMPLANT
BNDG GAUZE ELAST 4 BULKY (GAUZE/BANDAGES/DRESSINGS) IMPLANT
BOOT SUTURE AID YELLOW STND (SUTURE) ×3 IMPLANT
CABLE EXTENSION OR (MISCELLANEOUS) ×2 IMPLANT
CABLE MER (MISCELLANEOUS) ×3 IMPLANT
CANISTER SUCT 3000ML PPV (MISCELLANEOUS) ×3 IMPLANT
CARTRIDGE OIL MAESTRO DRILL (MISCELLANEOUS) ×1 IMPLANT
CLIP RANEY DISP (INSTRUMENTS) ×9 IMPLANT
DECANTER SPIKE VIAL GLASS SM (MISCELLANEOUS) ×3 IMPLANT
DIFFUSER DRILL AIR PNEUMATIC (MISCELLANEOUS) ×3 IMPLANT
DRAPE POUCH INSTRU U-SHP 10X18 (DRAPES) ×3 IMPLANT
DRAPE STERI IOBAN 125X83 (DRAPES) IMPLANT
DRILL NEURO 2X3.1 SOFT TOUCH (MISCELLANEOUS)
DRSG OPSITE 4X5.5 SM (GAUZE/BANDAGES/DRESSINGS) ×6 IMPLANT
DRSG TELFA 3X8 NADH (GAUZE/BANDAGES/DRESSINGS) ×3 IMPLANT
DURAPREP 26ML APPLICATOR (WOUND CARE) ×3 IMPLANT
DURASEAL APPLICATOR TIP (TIP) ×6 IMPLANT
DURASEAL SPINE SEALANT 3ML (MISCELLANEOUS) ×6 IMPLANT
GAUZE SPONGE 4X4 12PLY STRL (GAUZE/BANDAGES/DRESSINGS) IMPLANT
GAUZE SPONGE 4X4 16PLY XRAY LF (GAUZE/BANDAGES/DRESSINGS) IMPLANT
GLOVE BIO SURGEON STRL SZ8 (GLOVE) ×6 IMPLANT
GLOVE BIOGEL PI IND STRL 7.0 (GLOVE) ×1 IMPLANT
GLOVE BIOGEL PI IND STRL 7.5 (GLOVE) ×2 IMPLANT
GLOVE BIOGEL PI IND STRL 8 (GLOVE) ×1 IMPLANT
GLOVE BIOGEL PI IND STRL 8.5 (GLOVE) ×1 IMPLANT
GLOVE BIOGEL PI INDICATOR 7.0 (GLOVE) ×2
GLOVE BIOGEL PI INDICATOR 7.5 (GLOVE) ×4
GLOVE BIOGEL PI INDICATOR 8 (GLOVE) ×2
GLOVE BIOGEL PI INDICATOR 8.5 (GLOVE) ×2
GLOVE ECLIPSE 8.0 STRL XLNG CF (GLOVE) ×3 IMPLANT
GLOVE EXAM NITRILE LRG STRL (GLOVE) IMPLANT
GLOVE EXAM NITRILE XL STR (GLOVE) IMPLANT
GLOVE EXAM NITRILE XS STR PU (GLOVE) IMPLANT
GLOVE SURG SS PI 7.0 STRL IVOR (GLOVE) ×9 IMPLANT
GOWN STRL REUS W/ TWL LRG LVL3 (GOWN DISPOSABLE) IMPLANT
GOWN STRL REUS W/ TWL XL LVL3 (GOWN DISPOSABLE) ×2 IMPLANT
GOWN STRL REUS W/TWL 2XL LVL3 (GOWN DISPOSABLE) ×3 IMPLANT
GOWN STRL REUS W/TWL LRG LVL3 (GOWN DISPOSABLE)
GOWN STRL REUS W/TWL XL LVL3 (GOWN DISPOSABLE) ×4
HEMOSTAT POWDER KIT SURGIFOAM (HEMOSTASIS) ×3 IMPLANT
KIT BASIN OR (CUSTOM PROCEDURE TRAY) ×3 IMPLANT
KIT COVER BURR HOLE (Miscellaneous) ×4 IMPLANT
KIT HEADREST PASSIVE (Neuro Prosthesis/Implant) ×3 IMPLANT
KIT LEAD 45CM (Miscellaneous) ×4 IMPLANT
KIT NEUROSTIM DBS W/BURR COVER (Miscellaneous) ×2 IMPLANT
KIT PLATFORM UNI 4LEG STARFIX (KITS) IMPLANT
KIT ROOM TURNOVER OR (KITS) ×3 IMPLANT
KIT SURETEK BURR HOLE COVER SP (Miscellaneous) ×2 IMPLANT
MARKER SKIN DUAL TIP RULER LAB (MISCELLANEOUS) ×3 IMPLANT
NEEDLE HYPO 25X1 1.5 SAFETY (NEEDLE) ×3 IMPLANT
NEEDLE SPNL 18GX3.5 QUINCKE PK (NEEDLE) IMPLANT
NEEDLE SPNL 22GX3.5 QUINCKE BK (NEEDLE) IMPLANT
NS IRRIG 1000ML POUR BTL (IV SOLUTION) ×3 IMPLANT
OIL CARTRIDGE MAESTRO DRILL (MISCELLANEOUS) ×3
PACK LAMINECTOMY NEURO (CUSTOM PROCEDURE TRAY) ×3 IMPLANT
PAD ARMBOARD 7.5X6 YLW CONV (MISCELLANEOUS) ×6 IMPLANT
PASSIVE HEADREST KIT ×2 IMPLANT
PERFORATOR LRG  14-11MM (BIT) ×2
PERFORATOR LRG 14-11MM (BIT) ×1 IMPLANT
PLATEFORM ARRAY DZAP LEADPOINT (MISCELLANEOUS) ×2
PLATFORM ARRAY DZAP LEADPOINT (MISCELLANEOUS) ×1 IMPLANT
PLATFORM BILATERAL KIT (MISCELLANEOUS) ×3 IMPLANT
SET SINGLE IT STRL (KITS) ×6 IMPLANT
SPONGE SURGIFOAM ABS GEL SZ50 (HEMOSTASIS) IMPLANT
STAPLER SKIN PROX WIDE 3.9 (STAPLE) ×6 IMPLANT
STAPLER VISISTAT 35W (STAPLE) IMPLANT
SUT ETHILON 3 0 PS 1 (SUTURE) IMPLANT
SUT SILK 2 0 TIES 10X30 (SUTURE) ×3 IMPLANT
SUT VIC AB 2-0 CP2 18 (SUTURE) ×6 IMPLANT
SYR CONTROL 10ML LL (SYRINGE) IMPLANT
TOWEL OR 17X24 6PK STRL BLUE (TOWEL DISPOSABLE) ×3 IMPLANT
TOWEL OR 17X26 10 PK STRL BLUE (TOWEL DISPOSABLE) ×3 IMPLANT
TRAY FOLEY W/METER SILVER 16FR (SET/KITS/TRAYS/PACK) IMPLANT
TUBE CONNECTING 12'X1/4 (SUCTIONS) ×1
TUBE CONNECTING 12X1/4 (SUCTIONS) ×2 IMPLANT
WATER STERILE IRR 1000ML POUR (IV SOLUTION) ×3 IMPLANT

## 2017-02-10 NOTE — Anesthesia Procedure Notes (Signed)
Procedure Name: MAC Date/Time: 02/10/2017 7:35 AM Performed by: Candis Shine Pre-anesthesia Checklist: Patient identified, Emergency Drugs available, Suction available, Patient being monitored and Timeout performed Oxygen Delivery Method: Nasal cannula Dental Injury: Teeth and Oropharynx as per pre-operative assessment

## 2017-02-10 NOTE — Interval H&P Note (Signed)
History and Physical Interval Note:  02/10/2017 7:29 AM  Ian PurserKenneth Jorgenson  has presented today for surgery, with the diagnosis of Parkinson Disease  The various methods of treatment have been discussed with the patient and family. After consideration of risks, benefits and other options for treatment, the patient has consented to  Procedure(s) with comments: Bilateral Deep brain stimulator placement (Bilateral) - Bilateral Deep brain stimulator placement as a surgical intervention .  The patient's history has been reviewed, patient examined, no change in status, stable for surgery.  I have reviewed the patient's chart and labs.  Questions were answered to the patient's satisfaction.     [name] D

## 2017-02-10 NOTE — Brief Op Note (Signed)
02/10/2017  11:52 AM  PATIENT:  Ian [name]  55 y.o. male  PRE-OPERATIVE DIAGNOSIS:  Parkinson Disease  POST-OPERATIVE DIAGNOSIS:  Parkinson Disease  PROCEDURE:  Procedure(s) with comments: Bilateral Deep brain stimulator placement (Bilateral) - Bilateral Deep brain stimulator placement bilateral STN electrodes ([employer])  SURGEON:  Surgeon(s) and Role:    * Lavelle Akel, MD - Primary  PHYSICIAN ASSISTANT:   ASSISTANTS: Poteat, RN   ANESTHESIA:   local and IV sedation  EBL:  Total I/O In: 700 [I.V.:700] Out: 25 [Blood:25]  BLOOD ADMINISTERED:none  DRAINS: none   LOCAL MEDICATIONS USED:  MARCAINE    and LIDOCAINE   SPECIMEN:  No Specimen  DISPOSITION OF SPECIMEN:  N/A  COUNTS:  YES  TOURNIQUET:  * No tourniquets in log *  DICTATION:  Indications: Patient is a 55-year-old man with Parkinson's Disease. It was elected to take him to surgery for bilateral STN  deep brain stimulator electrode placement  Procedure: Preoperative planing was performed with volumetricCT and placement of 4 fiducial markers in the skull followed by CT of the brain also obtained volumetrically. These were then exported to create Starfix head frame with planned targeting of STN nucleus electrodes was performed. The patient was brought to the operating room and placed in a semi-Fowler's position with his neck and stabilized in a neck holder. This was affixed to the [place] adapter. His scalp was then prepped with betadine scrub and DuraPrep and subsequently draped with an Ioban drape.with the fiducials and the skin was infiltrated with local lidocaine.  The areas of planned incision were then infiltrated with local lidocaine with epinephrine. The stepoffs were connected and the Starfix frame was assembled.  The entry points were then marked.  One linear incision was made centered on the bilateral entry points. An elevator was used to clear pericranium from the skull. The  perforator it was then used to produce two 14 mm bur holes. The dura was coagulated with bipolar electrocautery. Initially we operated on the left and subsequently on the right. After opening the dura and a localizing the entry point the stylette and outer cannula were inserted into the brain. Surgifoam and Duraseal was placed to prevent CSF leakage at each entry site. Microelectrode recordings were then performed and  we had very good recordings from the STN. Subsequently the stimulating electrode ([employer]) was placed and the patient had significant improvement in tremor and rigidity on the right side of the body without significant side effects to higher voltages. The electrode was then locked into position with the stimlock cap. The redundant electrode was circularized and tunneled under the scalp. The assembly was reassembled on the right.  Initially, there was not a good microelectrode recording on this sie, so we made a posterior move and at that point, there was good microelectrode recordings for 4.5 mm of STN and after placing the stimulating electrode the patient had excellent control of tremor and rigidityon the right side of his body.  We therefore elected to place this electrode and locked into position and the stereotactic assembly was disassembled. Both electrodes were tunneled to the right and then into the posterior scalp and locked into position. The wounds were then irrigated and closed with 2-0 Vicryl sutures and staples. The fiducials were removed and staples were placed over each of these sites. The head was washed and then sterile occlusive dressings were placed. The patient was taken to recovery having tolerated her procedure without difficulty or untoward effect. Please refer to   detailed microelectrode recordings from Dr. [name] for more specifics of the positioning of the electrodes. These are included in her Epic note from the detailed neural monitoring and physical exam assessment  during the surgery.  PLAN OF CARE: Admit to inpatient   PATIENT DISPOSITION:  PACU - hemodynamically stable.   Delay start of Pharmacological VTE agent (>24hrs) due to surgical blood loss or risk of bleeding: yes  

## 2017-02-10 NOTE — H&P (View-Only) (Signed)
Patient ID:   [id number] Patient: Ian [name]  Date of Birth: 02/21/1961 Visit Type: Office Visit   Date: 12/07/2016 04:00 PM Provider: Nessie Nong D. Kemari Mares MD   This 56 year old male presents for Parkinson's Disease.  History of Present Illness: 1.  Parkinson's Disease    The patient comes in to discuss bilateral STN DBS implantation for Parkinsons.        MEDICATIONS(added, continued or stopped this visit): Started Medication Directions Instruction Stopped   Requip 4 mg tablet take 2 tablet by oral route  every day     [name] 48.75 mg-195 mg capsule,extended release take 1 capsule by oral route 3 times every day       ALLERGIES: Ingredient Reaction Medication Name Comment  NO KNOWN ALLERGIES     No known allergies. Reviewed, no changes.    Vitals Date Temp F BP Pulse Ht In Wt Lb BMI BSA Pain Score  12/07/2016  128/68 89 74 170 21.83  0/10      IMPRESSION The patient has had a few months to think and he is ready to schedule bilateral STN DBS implantation. I discussed the details of surgery in depth with the patient and his wife today and answered all their questions.   Completed Orders (this encounter) Order Details Reason Side Interpretation Result Initial Treatment Date Region  Hypertension education Continue to monitor blood pressure. If remains elevated, contact primary care physician.         Assessment/Plan # Detail Type Description   1. Assessment Elevated blood-pressure reading, w/o diagnosis of htn (R03.0).         Pain Assessment/Treatment Pain Scale: 0/10. Method: Numeric Pain Intensity Scale. Onset: 04/27/1996.  Schedule bilateral STN DBS. Nurse education given.   Orders: Instruction(s)/Education: Assessment Instruction  R03.0 Hypertension education             Provider:  Kahlee Metivier D. Carder Yin MD  12/07/2016 04:34 PM Dictation edited by: Samuel F. McBride    CC Providers: [name] [name] [address] [place],   [address]              Electronically signed by Madox Corkins D. Amen Dargis MD on 12/11/2016 01:13 PM  Patient ID:   [id number] Patient: Ian [name]  Date of Birth: 05/24/1961 Visit Type: Office Visit   Date: 04/27/2016 10:30 AM Provider: Izayiah Tibbitts D. Magdiel Bartles MD   This 56 year old male presents for Parkinson's Disease.  History of Present Illness: 1.  Parkinson's Disease    Telvin Treadway, 56-year-old retired male, visits on Dr. [name] [name]'s referral to discuss deep brain stimulation for Parkinson's.  He has noticed improvements with [name] 45/195 qid and Requip 4mg 2bid.  Tremor, freezing, and sleep continue to affect him daily. He notes inconsistent symptoms with his current course of medications. Occasional dyskinesias. He states no cognitive issues.   Parkinson's since 2014. 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        PAST MEDICAL/SURGICAL HISTORY   (Detailed)  Disease/disorder Onset Date Management Date Comments  Parkinson disease         PAST MEDICAL HISTORY, SURGICAL HISTORY, FAMILY HISTORY, SOCIAL HISTORY AND REVIEW OF SYSTEMS  04/27/2016, which I have signed.  Family History  (Detailed) Relationship Family Member Name Deceased Age at Death Condition Onset Age Cause of Death  Daughter  N  Alive and well    Father    Diabetes mellitus  N  Mother    breat  N  Sister  N  Alive and well      Son  N  Alive and well      SOCIAL HISTORY  (Detailed) Tobacco use reviewed. Preferred language is Unknown.   Smoking status: Never smoker.  SMOKING STATUS Use Status Type Smoking Status Usage Per Day Years Used Total Pack Years  no/never  Never smoker       HOME ENVIRONMENT/SAFETY The patient has not fallen in the last year.        MEDICATIONS(added, continued or stopped this visit): Started Medication Directions Instruction Stopped   Requip 4 mg tablet take 2 tablet by oral route  every day     [name] 48.75 mg-195 mg capsule,extended release take 1  capsule by oral route 3 times every day       ALLERGIES: Ingredient Reaction Medication Name Comment  NO KNOWN ALLERGIES     No known allergies.    Vitals Date Temp F BP Pulse Ht In Wt Lb BMI BSA Pain Score  04/27/2016  102/64 69 74 172.6 22.16  3/10     PHYSICAL EXAM General Level of Distress: no acute distress Overall Appearance: normal  Head and Face  Right Left  Fundoscopic Exam:  normal normal    Cardiovascular Cardiac: regular rate and rhythm without murmur  Right Left  Carotid Pulses: normal normal  Respiratory Lungs: clear to auscultation  Neurological Orientation: normal Recent and Remote Memory: normal Attention Span and Concentration:   normal Language: normal Fund of Knowledge: normal  Right Left Sensation: normal normal Upper Extremity Coordination: normal normal  Lower Extremity Coordination: normal normal  Musculoskeletal Gait and Station: normal  Right Left Upper Extremity Muscle Strength: normal normal Lower Extremity Muscle Strength: normal normal Upper Extremity Muscle Tone:  normal normal Lower Extremity Muscle Tone: normal normal  Motor Strength Upper and lower extremity motor strength was tested in the clinically pertinent muscles.     Deep Tendon Reflexes  Right Left Biceps: normal normal Triceps: normal normal Brachiloradialis: normal normal Patellar: normal normal Achilles: normal normal  Sensory Sensation was tested at L1 to S1.   Cranial Nerves II. Optic Nerve/Visual Fields: normal III. Oculomotor: normal IV. Trochlear: normal V. Trigeminal: normal VI. Abducens: normal VII. Facial: normal VIII. Acoustic/Vestibular: normal IX. Glossopharyngeal: normal X. Vagus: normal XI. Spinal Accessory: normal XII. Hypoglossal: normal  Motor and other Tests Lhermittes: negative Rhomberg: negative Pronator  drift: absent     Right Left Spurlings negative negative Hoffman's: normal normal Clonus: normal normal Babinski: normal normal SLR: negative negative Patrick's (Faber): negative negative Toe Walk: normal normal Toe Lift: normal normal Heel Walk: normal normal Tinels Elbow: negative negative Tinels Wrist: negative negative SI Joint: nontender nontender Phalen: negative negative   Additional Findings:  cogwheeling rigidty, rest tremor    IMPRESSION The patient is diagnosed with Parkinson's and is interested in proceeding with DBS. We discussed risk and benefits of bilateral deep brain stimulation and surgical procedures. I showed him associated stimulation hardware. I agree with Dr.[name] and believe the patient would be a good candidate for bilateral STN DBS. We discussed potential mid-June or August surgery, he has a family vacation planned in late June.   Pain Assessment/Treatment Pain Scale: 3/10. Method: Numeric Pain Intensity Scale. Location: back. Onset: 04/27/1996. Duration: varies. Quality: discomforting. Pain Assessment/Treatment follow-up plan of care: Patient is taking over the counter pain relievers for relief..  Fall Risk Plan The patient has not fallen in the last year.  Schedule for bilateral STN DBS             Provider:    Cassi Jenne D. Alia Parsley MD  04/27/2016 11:45 AM Dictation edited by: Samuel F. McBride    CC Providers: [name] [name] [address] [place], [place] [address]              Electronically signed by Raylyn Carton D. Ednamae Schiano MD on 04/27/2016 12:12 PM   

## 2017-02-10 NOTE — Progress Notes (Signed)
Awake, alert, conversant.  MAEW with good strength.  Minimal headache.

## 2017-02-10 NOTE — Anesthesia Postprocedure Evaluation (Addendum)
Anesthesia Post Note  Patient: Ian [name]  Procedure(s) Performed: Procedure(s) (LRB): Bilateral Deep brain stimulator placement (Bilateral)  Patient location during evaluation: PACU Anesthesia Type: MAC Level of consciousness: awake and alert Pain management: pain level controlled Vital Signs Assessment: post-procedure vital signs reviewed and stable Respiratory status: spontaneous breathing, nonlabored ventilation, respiratory function stable and patient connected to nasal cannula oxygen Cardiovascular status: stable and blood pressure returned to baseline Anesthetic complications: no       Last Vitals:  Vitals:   02/10/17 1230 02/10/17 1300  BP: 120/76 131/75  Pulse: 80 65  Resp: (!) 21 20  Temp:  36.7 C    Last Pain:  Vitals:   02/10/17 1300  TempSrc: Oral  PainSc: 8                  Efrem Pitstick,JAMES TERRILL

## 2017-02-10 NOTE — Anesthesia Preprocedure Evaluation (Addendum)
Anesthesia Evaluation  Patient identified by MRN, date of birth, ID band Patient awake    Reviewed: Allergy & Precautions, NPO status , Patient's Chart, lab work & pertinent test results  Airway Mallampati: I  TM Distance: >3 FB Neck ROM: Full    Dental  (+) Teeth Intact   Pulmonary neg pulmonary ROS,    breath sounds clear to auscultation       Cardiovascular negative cardio ROS   Rhythm:Regular Rate:Normal     Neuro/Psych Parkinsons disease  Neuromuscular disease    GI/Hepatic negative GI ROS, Neg liver ROS,   Endo/Other  negative endocrine ROS  Renal/GU negative Renal ROS     Musculoskeletal  (+) Arthritis ,   Abdominal   Peds  Hematology negative hematology ROS (+)   Anesthesia Other Findings   Reproductive/Obstetrics                             Anesthesia Physical Anesthesia Plan  ASA: II  Anesthesia Plan: MAC   Post-op Pain Management:    Induction: Intravenous  Airway Management Planned: Simple Face Mask and Natural Airway  Additional Equipment:   Intra-op Plan:   Post-operative Plan:   Informed Consent: I have reviewed the patients History and Physical, chart, labs and discussed the procedure including the risks, benefits and alternatives for the proposed anesthesia with the patient or authorized representative who has indicated his/her understanding and acceptance.   Dental advisory given  Plan Discussed with: CRNA  Anesthesia Plan Comments:         Anesthesia Quick Evaluation

## 2017-02-10 NOTE — Op Note (Signed)
02/10/2017  11:52 AM  PATIENT:  Ian [name]  56 y.o. male  PRE-OPERATIVE DIAGNOSIS:  Parkinson Disease  POST-OPERATIVE DIAGNOSIS:  Parkinson Disease  PROCEDURE:  Procedure(s) with comments: Bilateral Deep brain stimulator placement (Bilateral) - Bilateral Deep brain stimulator placement bilateral STN electrodes Conservation officer, historic buildings)  SURGEON:  Surgeon(s) and Role:    * Ian Harman, MD - Primary  PHYSICIAN ASSISTANT:   ASSISTANTS: Poteat, RN   ANESTHESIA:   local and IV sedation  EBL:  Total I/O In: 700 [I.V.:700] Out: 25 [Blood:25]  BLOOD ADMINISTERED:none  DRAINS: none   LOCAL MEDICATIONS USED:  MARCAINE    and LIDOCAINE   SPECIMEN:  No Specimen  DISPOSITION OF SPECIMEN:  N/A  COUNTS:  YES  TOURNIQUET:  * No tourniquets in log *  DICTATION:  Indications: Patient is a 56 year old man with Parkinson's Disease. It was elected to take him to surgery for bilateral STN  deep brain stimulator electrode placement  Procedure: Preoperative planing was performed with volumetricCT and placement of 4 fiducial markers in the skull followed by CT of the brain also obtained volumetrically. These were then exported to create Ian [name] head frame with planned targeting of STN nucleus electrodes was performed. The patient was brought to the operating room and placed in a semi-Fowler's position with his neck and stabilized in a neck holder. This was affixed to the [place] adapter. His scalp was then prepped with betadine scrub and DuraPrep and subsequently draped with an Ioban drape.with the fiducials and the skin was infiltrated with local lidocaine.  The areas of planned incision were then infiltrated with local lidocaine with epinephrine. The stepoffs were connected and the Ian [name] frame was assembled.  The entry points were then marked.  One linear incision was made centered on the bilateral entry points. An elevator was used to clear pericranium from the skull. The  perforator it was then used to produce two 14 mm bur holes. The dura was coagulated with bipolar electrocautery. Initially we operated on the left and subsequently on the right. After opening the dura and a localizing the entry point the stylette and outer cannula were inserted into the brain. Surgifoam and Duraseal was placed to prevent CSF leakage at each entry site. Microelectrode recordings were then performed and  we had very good recordings from the STN. Subsequently the stimulating electrode Ian [name]) was placed and the patient had significant improvement in tremor and rigidity on the right side of the body without significant side effects to higher voltages. The electrode was then locked into position with the stimlock cap. The redundant electrode was circularized and tunneled under the scalp. The assembly was reassembled on the right.  Initially, there was not a good microelectrode recording on this sie, so we made a posterior move and at that point, there was good microelectrode recordings for 4.5 mm of STN and after placing the stimulating electrode the patient had excellent control of tremor and rigidityon the right side of his body.  We therefore elected to place this electrode and locked into position and the stereotactic assembly was disassembled. Both electrodes were tunneled to the right and then into the posterior scalp and locked into position. The wounds were then irrigated and closed with 2-0 Vicryl sutures and staples. The fiducials were removed and staples were placed over each of these sites. The head was washed and then sterile occlusive dressings were placed. The patient was taken to recovery having tolerated her procedure without difficulty or untoward effect. Please refer to  detailed microelectrode recordings from Dr. Arbutus [name] for more specifics of the positioning of the electrodes. These are included in her Epic note from the detailed neural monitoring and physical exam assessment  during the surgery.  PLAN OF CARE: Admit to inpatient   PATIENT DISPOSITION:  PACU - hemodynamically stable.   Delay start of Pharmacological VTE agent (>24hrs) due to surgical blood loss or risk of bleeding: yes

## 2017-02-10 NOTE — Addendum Note (Signed)
Addended bySilvio Pate: Carnisha Feltz L on: 02/10/2017 08:52 AM   Modules accepted: Orders

## 2017-02-10 NOTE — Procedures (Signed)
Preoperative diagnosis:  Parkinson's disease Postoperative diagnosis:  same Surgeon:  [name] Neurologist:  [name] Jesua Tamblyn  CPT codes: [id number] (first hour) [phone] (for each additional hour) Indication for procedure: Determination of optimal electrode position for deep brain stimulation therapy. Complications: None   Description of procedure:  Following the incision and burr hole placement, a recording microelectrode was slowly advanced into the brain via a motorized drive.  Beginning approximately 10 mm above the target, recordings were periodically made, and the resultant brain activity was described on the neurophysiologic recording worksheet.  Relevant samples of the recordings were saved for subsequent off line analysis.  A total of one recording pass was obtained on the L side of the brain.    5.5 mm of STN  were recorded from the L side of the brain. The sensory-motor subregion of the STN was identified. Following the recording, the recording electrode was replaced by a stimulating electrode by Dr. Venetia MaxonStern.  Test stimulations were then obtained.  Active contacts that were used and the response to stimulation, including both adverse effects and therapeutic effects, were recorded on the neurophysiologic stimulation worksheet.  In brief, there was complete resolution of  rigidity and bradykinesia at therapeutic milliamps.     A target of mirror image of the initial side was selected for the contralateral side and 2 recording passes were obtained on the opposite side of the brain.  Initial pass demonstrated quiet recording so electrode/target was moved 2 mm posterior and much better recording was obtained.   4 mm of STN were recorded from the the right side of the brain.  The sensory-motor subregion was identified.  Tremor cells and response to passive stimulation were noted.  The recording electrode was again replaced by a stimulating electrode and test stimulations were obtained.  Again, there  was resolution of Parkinsonian sx's of  rigidity and bradykinesia were noted at therapeutic milliamps. We did have face pull in pseudomonopolar mode at therapeutic levels, but it resolved in bipolar mode.  Final electrode position was determined and the electrode was secured in place by Dr. Venetia MaxonStern on each side.  Kerin Salenebecca Lasaundra Riche, DO [facility] Director of Movement Disorders

## 2017-02-10 NOTE — Transfer of Care (Signed)
Immediate Anesthesia Transfer of Care Note  Patient: Ian [name]  Procedure(s) Performed: Procedure(s) with comments: Bilateral Deep brain stimulator placement (Bilateral) - Bilateral Deep brain stimulator placement  Patient Location: PACU  Anesthesia Type:MAC  Level of Consciousness: awake, alert , oriented, patient cooperative and responds to stimulation  Airway & Oxygen Therapy: Patient Spontanous Breathing and Patient connected to nasal cannula oxygen  Post-op Assessment: Report given to RN and Post -op Vital signs reviewed and stable  Post vital signs: Reviewed and stable  Last Vitals:  Vitals:   02/10/17 0640 02/10/17 1158  BP: 136/72   Pulse: 69   Resp: 18   Temp: 37 C (P) 36.6 C    Last Pain:  Vitals:   02/10/17 0640  TempSrc: Oral         Complications: No apparent anesthesia complications

## 2017-02-11 MED ORDER — ONDANSETRON 4 MG PO TBDP
4.0000 mg | ORAL_TABLET | Freq: Once | ORAL | Status: AC
  Administered 2017-02-11: 4 mg via ORAL
  Filled 2017-02-11: qty 1

## 2017-02-11 MED ORDER — ONDANSETRON HCL 4 MG PO TABS: 4.0000 mg | ORAL_TABLET | Freq: Three times a day (TID) | ORAL | 0 refills | Status: DC | PRN

## 2017-02-11 MED ORDER — HYDROCODONE-ACETAMINOPHEN 5-325 MG PO TABS: 1.0000 | ORAL_TABLET | Freq: Four times a day (QID) | ORAL | 0 refills | Status: DC | PRN

## 2017-02-11 NOTE — Evaluation (Addendum)
Occupational Therapy Evaluation/Discharge Patient Details Name: Ian PurserKenneth Bidwell MRN: [id number] DOB: [birth year] Today's Date: 02/11/2017    History of Present Illness Pt s/p implantation of deep brain stimulator. PMH - Parkinson's   Clinical Impression   PTA, pt was independent with ADL and functional mobility. Pt currently functioning at or near baseline for ADL participation. He required increased time for ADL this session but was able to complete with modified independence in hospital setting. Educated pt on energy conservation methods and safety post-acute D/C. He demonstrates understanding. No further OT needs identified. OT will sign off.    Follow Up Recommendations  No OT follow up;Supervision - Intermittent    Equipment Recommendations  None recommended by OT    Recommendations for Other Services       Precautions / Restrictions Precautions Precautions: None Restrictions Weight Bearing Restrictions: No      Mobility Bed Mobility Overal bed mobility: Independent                Transfers Overall transfer level: Independent Equipment used: None                  Balance Overall balance assessment: Modified Independent                                          ADL Overall ADL's : At baseline;Modified independent                                       General ADL Comments: Pt slightly unsteady on feet at baseline but able to self-corect and compensate by performing tasks carefully. Able to complete all ADL with increased time but no physical assistance.     Vision Baseline Vision/History: No visual deficits Patient Visual Report: No change from baseline Vision Assessment?: No apparent visual deficits Additional Comments: Able to use vision functionally during evaluation.     Perception     Praxis      Pertinent Vitals/Pain Pain Assessment: 0-10 Pain Score: 6  Faces Pain Scale: Hurts little more Pain Location:  head Pain Descriptors / Indicators: Pounding Pain Intervention(s): Monitored during session;Limited activity within patient's tolerance     Hand Dominance Right   Extremity/Trunk Assessment Upper Extremity Assessment Upper Extremity Assessment: Overall WFL for tasks assessed (Coordination and sensation WFL)   Lower Extremity Assessment Lower Extremity Assessment: Overall WFL for tasks assessed   Cervical / Trunk Assessment Cervical / Trunk Assessment: Normal   Communication Communication Communication: No difficulties   Cognition Arousal/Alertness: Awake/alert Behavior During Therapy: WFL for tasks assessed/performed Overall Cognitive Status: Within Functional Limits for tasks assessed                 General Comments: Pt thinking the day is Sunday but otherwise oriented.   General Comments       Exercises       Shoulder Instructions      Home Living Family/patient expects to be discharged to:: Private residence Living Arrangements: Spouse/significant other Available Help at Discharge: Family;Available PRN/intermittently;Available 24 hours/day Type of Home: House Home Access: Stairs to enter Entergy CorporationEntrance Stairs-Number of Steps: 1-2 Entrance Stairs-Rails: Right Home Layout: Two level;Able to live on main level with bedroom/bathroom Alternate Level Stairs-Number of Steps: flight Alternate Level Stairs-Rails: Right Bathroom Shower/Tub: Producer, television/film/videoWalk-in shower   Bathroom Toilet: Standard  Home Equipment: None          Prior Functioning/Environment Level of Independence: Independent        Comments: Exercises regularly.        OT Problem List: Decreased activity tolerance;Pain      OT Treatment/Interventions:      OT Goals(Current goals can be found in the care plan section) Acute Rehab OT Goals Patient Stated Goal: to go home and get some rest OT Goal Formulation: With patient/family Time For Goal Achievement: 02/18/17 Potential to Achieve Goals:  Good  OT Frequency:     Barriers to D/C:            Co-evaluation              End of Session Nurse Communication: Mobility status  Activity Tolerance: Patient tolerated treatment well Patient left: in bed;with call bell/phone within reach;with family/visitor present  OT Visit Diagnosis: Unsteadiness on feet (R26.81)                ADL either performed or assessed with clinical judgement  Time: 1610-9604 OT Time Calculation (min): 10 min Charges:  OT General Charges $OT Visit: 1 Procedure OT Evaluation $OT Eval Low Complexity: 1 Procedure G-Codes:     Doristine Section, MS OTR/L  Pager: [phone]   Mckyle Solanki A Eliana Lueth 02/11/2017, 8:24 AM

## 2017-02-11 NOTE — Discharge Summary (Signed)
Physician Discharge Summary  Patient ID: Ian [name] MRN: [id number] DOB/AGE: [birth year] 56 y.o.  Admit date: 02/10/2017 Discharge date: 02/11/2017  Admission Diagnoses: PD   Discharge Diagnoses: same   Discharged Condition: good  Hospital Course: The patient was admitted on 02/10/2017 and taken to the operating room where the patient underwent DBS. The patient tolerated the procedure well and was taken to the recovery room and then to the floor in stable condition. The hospital course was routine. There were no complications. The wound remained clean dry and intact. Pt had appropriate scalp soreness. No complaints ofnew N/T/W. The patient remained afebrile with stable vital signs, and tolerated a regular diet. The patient continued to increase activities, and pain was well controlled with oral pain medications.   Consults: None  Significant Diagnostic Studies:  Results for orders placed or performed during the hospital encounter of 02/10/17  CBC  Result Value Ref Range   WBC 6.8 4.0 - 10.5 K/uL   RBC 5.09 4.22 - 5.81 MIL/uL   Hemoglobin 15.1 13.0 - 17.0 g/dL   HCT 95.2 84.1 - 32.4 %   MCV 89.6 78.0 - 100.0 fL   MCH 29.7 26.0 - 34.0 pg   MCHC 33.1 30.0 - 36.0 g/dL   RDW 40.1 02.7 - 25.3 %   Platelets 248 150 - 400 K/uL  Basic metabolic panel  Result Value Ref Range   Sodium 139 135 - 145 mmol/L   Potassium 3.9 3.5 - 5.1 mmol/L   Chloride 106 101 - 111 mmol/L   CO2 25 22 - 32 mmol/L   Glucose, Bld 93 65 - 99 mg/dL   BUN 19 6 - 20 mg/dL   Creatinine, Ser 6.64 0.61 - 1.24 mg/dL   Calcium 9.3 8.9 - 40.3 mg/dL   GFR calc non Af Amer >60 >60 mL/min   GFR calc Af Amer >60 >60 mL/min   Anion gap 8 5 - 15    Ct Head Wo Contrast  Result Date: 02/10/2017 CLINICAL DATA:  Parkinson's disease status post deep brain stimulator placement today. EXAM: CT HEAD WITHOUT CONTRAST TECHNIQUE: Contiguous axial images were obtained from the base of the skull through the vertex without  intravenous contrast. COMPARISON:  Head CT 01/31/2017 FINDINGS: Brain: Bilateral deep brain stimulators have been placed and terminate in the subthalamic regions. There is small to moderate volume pneumocephalus overlying both frontal lobes. There is no midline shift or extra-axial fluid collection. No definite acute infarct or intracranial hemorrhage is identified. The ventricles are normal in size. Vascular: No hyperdense vessel or unexpected calcification. Skull: Bilateral frontoparietal burr holes. Sinuses/Orbits: Mild paranasal sinus mucosal thickening. Clear mastoid air cells. Unremarkable orbits. Other: Bilateral scalp skin staples with mild swelling/fluid. IMPRESSION: Expected changes from deep brain stimulator placement as above. Electronically Signed   By: Sebastian Ache M.D.   On: 02/10/2017 12:40   Ct Head Wo Contrast  Result Date: 01/31/2017 CLINICAL DATA:  56 year old male with Parkinson disease undergoing preoperative planning for deep brain stimulator surgery. Initial encounter. EXAM: CT HEAD WITHOUT CONTRAST TECHNIQUE: Contiguous axial images were obtained from the base of the skull through the vertex without intravenous contrast. COMPARISON:  Brain MRI 11/30/2016. FINDINGS: Brain: Cerebral volume is within normal limits. No midline shift, ventriculomegaly, mass effect, evidence of mass lesion, intracranial hemorrhage or evidence of cortically based acute infarction. Gray-white matter differentiation is within normal limits throughout the brain. Vascular: No suspicious intracranial vascular hyperdensity. Skull: A total of 4 anteriorly and posteriorly situated skull fiducials are  in place. No other acute osseous finding. Sinuses/Orbits: Scattered small polypoid areas of mucosal thickening in the ethmoid sinuses and right sphenoid sinus. The nasal cavity appears relatively spared. Mild left greater than right frontoethmoidal recess mucosal thickening. Visible maxillary sinuses, bilateral tympanic  cavities and mastoids are clear. Other: Mild scalp hematomas at the site of the skull fiducials. Negative orbits soft tissues. IMPRESSION: 1. Study for stereotactic preoperative planning. Skull fiducials in place. 2.  Normal non contrast CT appearance of the brain. Electronically Signed   By: Odessa FlemingH  Hall M.D.   On: 01/31/2017 13:26    Antibiotics:  Anti-infectives    Start     Dose/Rate Route Frequency Ordered Stop   02/17/17 0600  ceFAZolin (ANCEF) IVPB 2g/100 mL premix     2 g 200 mL/hr over 30 Minutes Intravenous On call to O.R. 02/10/17 [id number] 02/10/17 0750   02/10/17 1600  ceFAZolin (ANCEF) IVPB 2g/100 mL premix     2 g 200 mL/hr over 30 Minutes Intravenous Every 8 hours 02/10/17 1438 02/10/17 2341   02/10/17 0600  ceFAZolin (ANCEF) IVPB 2g/100 mL premix  Status:  Discontinued     2 g 200 mL/hr over 30 Minutes Intravenous On call to O.R. 02/10/17 0521 02/10/17 1258      Discharge Exam: Blood pressure (!) 108/56, pulse 74, temperature 100.3 F (37.9 C), temperature source Oral, resp. rate 18, SpO2 100 %. Neurologic: Grossly normal Dressings dry  Discharge Medications:   Allergies as of 02/11/2017   No Known Allergies     Medication List    TAKE these medications   carbidopa-levodopa 25-100 MG tablet Commonly known as:  SINEMET IR take 1 tablet by mouth twice a day What changed:  See the new instructions.   [name] 48.75-195 MG Cpcr Generic drug:  Carbidopa-Levodopa ER take 4 capsules by mouth every morning and 3 capsules three times a day What changed:  See the new instructions.   cephALEXin 250 MG capsule Commonly known as:  KEFLEX Take 250 mg by mouth 3 (three) times daily.   HYDROcodone-acetaminophen 5-325 MG tablet Commonly known as:  NORCO/VICODIN Take 1 tablet by mouth every 6 (six) hours as needed. What changed:  reasons to take this   LECITHIN PO Take 1 capsule by mouth daily.   MULTI-VITAMINS Tabs Take 1 tablet by mouth 2 (two) times daily.   niacin 500  MG tablet Take 500 mg by mouth 3 (three) times daily with meals.   rOPINIRole 4 MG 24 hr tablet Commonly known as:  [name] Take 2 tablets (8 mg total) by mouth 2 (two) times daily.   vitamin C 1000 MG tablet Take 5,000 mg by mouth daily.   Vitamin D-3 5000 units Tabs Take 5,000 Units by mouth daily.   vitamin E 400 UNIT capsule Take 400 Units by mouth daily.       Disposition: home   Final Dx: DBS stage 1  Discharge Instructions     Remove dressing in 72 hours    Complete by:  As directed    Call MD for:  difficulty breathing, headache or visual disturbances    Complete by:  As directed    Call MD for:  persistant nausea and vomiting    Complete by:  As directed    Call MD for:  redness, tenderness, or signs of infection (pain, swelling, redness, odor or green/yellow discharge around incision site)    Complete by:  As directed    Call MD for:  severe  uncontrolled pain    Complete by:  As directed    Call MD for:  temperature >100.4    Complete by:  As directed    Diet - low sodium heart healthy    Complete by:  As directed    Increase activity slowly    Complete by:  As directed          Signed: Cystal Shannahan S 02/11/2017, 6:55 AM

## 2017-02-11 NOTE — Progress Notes (Signed)
Pt and wife given D/C instructions with Rx's, verbal understanding was provided. Pt's incision are covered with minimal amount of drainage. Pt's IV was removed prior to D/C. Pt D/C'd home via wheelchair @ 1015 per MD order. Pt is stable @ D/C and has no other needs at this time. Rema FendtAshley Affie Gasner, RN

## 2017-02-11 NOTE — Evaluation (Signed)
Physical Therapy Evaluation Patient Details Name: Ian [name] MRN: [id number] DOB: 04/19/1961 Today's Date: 02/11/2017   History of Present Illness  Pt s/p implantation of deep brain stimulator. PMH - Parkinson's  Clinical Impression  Pt doing well with mobility and no further PT needed.  Ready for dc from PT standpoint.      Follow Up Recommendations No PT follow up    Equipment Recommendations  None recommended by PT    Recommendations for Other Services       Precautions / Restrictions Precautions Precautions: None Restrictions Weight Bearing Restrictions: No      Mobility  Bed Mobility Overal bed mobility: Independent                Transfers Overall transfer level: Independent                  Ambulation/Gait Ambulation/Gait assistance: Modified independent (Device/Increase time) Ambulation Distance (Feet): 400 Feet Assistive device: None Gait Pattern/deviations: Step-through pattern;Drifts right/left Gait velocity: decr Gait velocity interpretation: Below normal speed for age/gender General Gait Details: No loss of balance. Slight drift.  Stairs Stairs: Yes Stairs assistance: Modified independent (Device/Increase time) Stair Management: One rail Right;Alternating pattern;Forwards Number of Stairs: 3    Wheelchair Mobility    Modified Rankin (Stroke Patients Only)       Balance Overall balance assessment: Modified Independent                                           Pertinent Vitals/Pain Pain Assessment: Faces Faces Pain Scale: Hurts little more Pain Location: head Pain Descriptors / Indicators: Sore Pain Intervention(s): Limited activity within patient's tolerance    Home Living Family/patient expects to be discharged to:: Private residence Living Arrangements: Spouse/significant other Available Help at Discharge: Family;Available PRN/intermittently;Available 24 hours/day Type of Home: House Home  Access: Stairs to enter Entrance Stairs-Rails: Right Entrance Stairs-Number of Steps: 1-2 Home Layout: Two level;Able to live on main level with bedroom/bathroom Home Equipment: None      Prior Function Level of Independence: Independent               Hand Dominance        Extremity/Trunk Assessment   Upper Extremity Assessment Upper Extremity Assessment: Defer to OT evaluation    Lower Extremity Assessment Lower Extremity Assessment: Overall WFL for tasks assessed    Cervical / Trunk Assessment Cervical / Trunk Assessment: Normal  Communication   Communication: No difficulties  Cognition Arousal/Alertness: Awake/alert Behavior During Therapy: WFL for tasks assessed/performed Overall Cognitive Status: Within Functional Limits for tasks assessed                      General Comments      Exercises     Assessment/Plan    PT Assessment Patent does not need any further PT services  PT Problem List         PT Treatment Interventions      PT Goals (Current goals can be found in the Care Plan section)  Acute Rehab PT Goals PT Goal Formulation: All assessment and education complete, DC therapy    Frequency     Barriers to discharge        Co-evaluation               End of Session Equipment Utilized During Treatment: Gait belt Activity Tolerance: Patient tolerated  treatment well Patient left: in bed;with call bell/phone within reach;with family/visitor present Nurse Communication: Mobility status PT Visit Diagnosis: Unsteadiness on feet (R26.81)         Time: 0716-0729 PT Time Calculation (min) (ACUTE ONLY): 13 min   Charges:   PT Evaluation $PT Eval Low Complexity: 1 Procedure     PT G CodesAngelina Ok:         Brando Taves [facility] 02/11/2017, 7:36 AM [facility] Center[facility] Christie Copley PT [phone]

## 2017-02-13 ENCOUNTER — Encounter: Payer: Self-pay | Admitting: Neurology

## 2017-02-13 ENCOUNTER — Encounter (HOSPITAL_COMMUNITY): Payer: Self-pay | Admitting: Neurosurgery

## 2017-02-13 ENCOUNTER — Telehealth: Payer: Self-pay | Admitting: Neurology

## 2017-02-16 ENCOUNTER — Encounter (HOSPITAL_COMMUNITY): Payer: Self-pay | Admitting: *Deleted

## 2017-02-16 ENCOUNTER — Other Ambulatory Visit: Payer: Self-pay | Admitting: Neurosurgery

## 2017-02-16 NOTE — Anesthesia Preprocedure Evaluation (Addendum)
Anesthesia Evaluation  Patient identified by MRN, date of birth, ID band Patient awake    Reviewed: Allergy & Precautions, NPO status , Patient's Chart, lab work & pertinent test results  History of Anesthesia Complications (+) PONV and history of anesthetic complications  Airway Mallampati: II  TM Distance: >3 FB Neck ROM: Full    Dental  (+) Teeth Intact, Dental Advisory Given   Pulmonary    breath sounds clear to auscultation       Cardiovascular negative cardio ROS   Rhythm:Regular Rate:Normal     Neuro/Psych  Neuromuscular disease negative psych ROS   GI/Hepatic negative GI ROS, Neg liver ROS,   Endo/Other  negative endocrine ROS  Renal/GU negative Renal ROS  negative genitourinary   Musculoskeletal  (+) Arthritis , Osteoarthritis,    Abdominal   Peds negative pediatric ROS (+)  Hematology negative hematology ROS (+)   Anesthesia Other Findings   Reproductive/Obstetrics negative OB ROS                            Anesthesia Physical Anesthesia Plan  ASA: II  Anesthesia Plan: General   Post-op Pain Management:    Induction: Intravenous  Airway Management Planned: Oral ETT  Additional Equipment:   Intra-op Plan:   Post-operative Plan: Extubation in OR  Informed Consent: I have reviewed the patients History and Physical, chart, labs and discussed the procedure including the risks, benefits and alternatives for the proposed anesthesia with the patient or authorized representative who has indicated his/her understanding and acceptance.   Dental advisory given  Plan Discussed with: CRNA  Anesthesia Plan Comments:        Anesthesia Quick Evaluation

## 2017-02-16 NOTE — Progress Notes (Signed)
Pt was here last week for surgery. He states nothing has changed with his medications, medical and surgical history. Pt will not take any of his medications in the AM because he usually takes them at 8 AM or later.

## 2017-02-17 ENCOUNTER — Inpatient Hospital Stay (HOSPITAL_COMMUNITY)
Admission: RE | Admit: 2017-02-17 | Discharge: 2017-02-17 | DRG: 042 | Disposition: A | Discharge status: Home / Self Care | Payer: Medicare Other | Source: Ambulatory Visit | Attending: Neurosurgery | Admitting: Neurosurgery

## 2017-02-17 ENCOUNTER — Ambulatory Visit (HOSPITAL_COMMUNITY): Payer: Medicare Other | Admitting: Anesthesiology

## 2017-02-17 ENCOUNTER — Encounter: Payer: Self-pay | Admitting: Neurology

## 2017-02-17 ENCOUNTER — Encounter (HOSPITAL_COMMUNITY): Payer: Self-pay | Admitting: *Deleted

## 2017-02-17 ENCOUNTER — Encounter (HOSPITAL_COMMUNITY)
Admission: RE | Disposition: A | Discharge status: Home / Self Care | Payer: Self-pay | Source: Ambulatory Visit | Attending: Neurosurgery

## 2017-02-17 DIAGNOSIS — Z79899 Other long term (current) drug therapy: Secondary | ICD-10-CM

## 2017-02-17 DIAGNOSIS — G20A1 Parkinson's disease without dyskinesia, without mention of fluctuations: Secondary | ICD-10-CM | POA: Diagnosis present

## 2017-02-17 DIAGNOSIS — G20 Parkinson's disease: Secondary | ICD-10-CM | POA: Diagnosis present

## 2017-02-17 DIAGNOSIS — R030 Elevated blood-pressure reading, without diagnosis of hypertension: Secondary | ICD-10-CM | POA: Diagnosis present

## 2017-02-17 DIAGNOSIS — M1990 Unspecified osteoarthritis, unspecified site: Secondary | ICD-10-CM | POA: Diagnosis present

## 2017-02-17 HISTORY — DX: Other specified postprocedural states: Z98.890

## 2017-02-17 HISTORY — PX: PULSE GENERATOR IMPLANT: SHX5370

## 2017-02-17 HISTORY — DX: Other specified postprocedural states: R11.2

## 2017-02-17 SURGERY — BILATERAL PULSE GENERATOR IMPLANT
Anesthesia: General | Site: Chest | Laterality: Right

## 2017-02-17 MED ORDER — CARBIDOPA-LEVODOPA 25-100 MG PO TABS
1.0000 | ORAL_TABLET | Freq: Every day | ORAL | Status: DC | PRN
  Filled 2017-02-17: qty 1

## 2017-02-17 MED ORDER — OXYCODONE HCL 5 MG PO TABS: 5.0000 mg | ORAL_TABLET | ORAL | Status: DC | PRN

## 2017-02-17 MED ORDER — ROPINIROLE HCL ER 8 MG PO TB24
8.0000 mg | ORAL_TABLET | Freq: Two times a day (BID) | ORAL | Status: DC
  Filled 2017-02-17 (×2): qty 1

## 2017-02-17 MED ORDER — HYDROCODONE-ACETAMINOPHEN 5-325 MG PO TABS: 1.0000 | ORAL_TABLET | ORAL | Status: DC | PRN

## 2017-02-17 MED ORDER — LIDOCAINE-EPINEPHRINE 2 %-1:100000 IJ SOLN
INTRAMUSCULAR | Status: DC | PRN
  Administered 2017-02-17: 12 mL via INTRADERMAL

## 2017-02-17 MED ORDER — MIDAZOLAM HCL 2 MG/2ML IJ SOLN
INTRAMUSCULAR | Status: AC
  Filled 2017-02-17: qty 2

## 2017-02-17 MED ORDER — EPHEDRINE 5 MG/ML INJ
INTRAVENOUS | Status: AC
  Filled 2017-02-17: qty 10

## 2017-02-17 MED ORDER — CARBIDOPA-LEVODOPA ER 48.75-195 MG PO CPCR: 3.0000 | ORAL_CAPSULE | Freq: Once | ORAL | Status: DC

## 2017-02-17 MED ORDER — ONDANSETRON HCL 4 MG PO TABS: 4.0000 mg | ORAL_TABLET | Freq: Four times a day (QID) | ORAL | Status: DC | PRN

## 2017-02-17 MED ORDER — POLYETHYLENE GLYCOL 3350 17 G PO PACK: 17.0000 g | PACK | Freq: Every day | ORAL | Status: DC | PRN

## 2017-02-17 MED ORDER — SUCCINYLCHOLINE CHLORIDE 200 MG/10ML IV SOSY
PREFILLED_SYRINGE | INTRAVENOUS | Status: AC
  Filled 2017-02-17: qty 10

## 2017-02-17 MED ORDER — LIDOCAINE 2% (20 MG/ML) 5 ML SYRINGE
INTRAMUSCULAR | Status: AC
  Filled 2017-02-17: qty 5

## 2017-02-17 MED ORDER — ONDANSETRON HCL 4 MG/2ML IJ SOLN: 4.0000 mg | Freq: Four times a day (QID) | INTRAMUSCULAR | Status: DC | PRN

## 2017-02-17 MED ORDER — CEPHALEXIN 250 MG PO CAPS: 250.0000 mg | ORAL_CAPSULE | Freq: Three times a day (TID) | ORAL | Status: DC

## 2017-02-17 MED ORDER — ADULT MULTIVITAMIN W/MINERALS CH: 1.0000 | ORAL_TABLET | Freq: Two times a day (BID) | ORAL | Status: DC

## 2017-02-17 MED ORDER — 0.9 % SODIUM CHLORIDE (POUR BTL) OPTIME
TOPICAL | Status: DC | PRN
  Administered 2017-02-17: 1000 mL

## 2017-02-17 MED ORDER — SODIUM CHLORIDE 0.9 % IV SOLN: 250.0000 mL | INTRAVENOUS | Status: DC

## 2017-02-17 MED ORDER — ONDANSETRON HCL 4 MG/2ML IJ SOLN
INTRAMUSCULAR | Status: AC
  Filled 2017-02-17: qty 2

## 2017-02-17 MED ORDER — ROCURONIUM BROMIDE 100 MG/10ML IV SOLN
INTRAVENOUS | Status: DC | PRN
  Administered 2017-02-17: 50 mg via INTRAVENOUS

## 2017-02-17 MED ORDER — CEFAZOLIN SODIUM-DEXTROSE 2-4 GM/100ML-% IV SOLN
2.0000 g | INTRAVENOUS | Status: AC
  Administered 2017-02-17: 2 g via INTRAVENOUS
  Filled 2017-02-17: qty 100

## 2017-02-17 MED ORDER — LACTATED RINGERS IV SOLN
INTRAVENOUS | Status: DC | PRN
  Administered 2017-02-17 (×2): via INTRAVENOUS

## 2017-02-17 MED ORDER — KCL IN DEXTROSE-NACL 20-5-0.45 MEQ/L-%-% IV SOLN: INTRAVENOUS | Status: DC

## 2017-02-17 MED ORDER — VANCOMYCIN HCL 1000 MG IV SOLR
INTRAVENOUS | Status: AC
  Filled 2017-02-17: qty 1000

## 2017-02-17 MED ORDER — NIACIN 500 MG PO TABS
500.0000 mg | ORAL_TABLET | Freq: Three times a day (TID) | ORAL | Status: DC
  Filled 2017-02-17 (×2): qty 1

## 2017-02-17 MED ORDER — CARBIDOPA-LEVODOPA ER 48.75-195 MG PO CPCR: 48.7500 mg | ORAL_CAPSULE | Freq: Four times a day (QID) | ORAL | Status: DC

## 2017-02-17 MED ORDER — LIDOCAINE HCL (CARDIAC) 20 MG/ML IV SOLN
INTRAVENOUS | Status: DC | PRN
  Administered 2017-02-17: 60 mg via INTRAVENOUS

## 2017-02-17 MED ORDER — CEFAZOLIN SODIUM-DEXTROSE 2-4 GM/100ML-% IV SOLN: 2.0000 g | Freq: Three times a day (TID) | INTRAVENOUS | Status: DC

## 2017-02-17 MED ORDER — ONDANSETRON HCL 4 MG PO TABS: 4.0000 mg | ORAL_TABLET | Freq: Three times a day (TID) | ORAL | Status: DC | PRN

## 2017-02-17 MED ORDER — CHLORHEXIDINE GLUCONATE CLOTH 2 % EX PADS: 6.0000 | MEDICATED_PAD | Freq: Once | CUTANEOUS | Status: DC

## 2017-02-17 MED ORDER — ONDANSETRON HCL 4 MG/2ML IJ SOLN
INTRAMUSCULAR | Status: DC | PRN
  Administered 2017-02-17: 4 mg via INTRAVENOUS

## 2017-02-17 MED ORDER — ARTIFICIAL TEARS OP OINT
TOPICAL_OINTMENT | OPHTHALMIC | Status: AC
  Filled 2017-02-17: qty 3.5

## 2017-02-17 MED ORDER — FLEET ENEMA 7-19 GM/118ML RE ENEM: 1.0000 | ENEMA | Freq: Once | RECTAL | Status: DC | PRN

## 2017-02-17 MED ORDER — PROPOFOL 10 MG/ML IV BOLUS
INTRAVENOUS | Status: AC
  Filled 2017-02-17: qty 20

## 2017-02-17 MED ORDER — PHENYLEPHRINE 40 MCG/ML (10ML) SYRINGE FOR IV PUSH (FOR BLOOD PRESSURE SUPPORT)
PREFILLED_SYRINGE | INTRAVENOUS | Status: AC
  Filled 2017-02-17: qty 10

## 2017-02-17 MED ORDER — HYDROMORPHONE HCL 1 MG/ML IJ SOLN: 0.5000 mg | INTRAMUSCULAR | Status: DC | PRN

## 2017-02-17 MED ORDER — CARBIDOPA-LEVODOPA ER 48.75-195 MG PO CPCR: 3.0000 | ORAL_CAPSULE | Freq: Three times a day (TID) | ORAL | Status: DC

## 2017-02-17 MED ORDER — VITAMIN C 500 MG PO TABS
5000.0000 mg | ORAL_TABLET | Freq: Every day | ORAL | Status: DC
  Filled 2017-02-17: qty 10

## 2017-02-17 MED ORDER — DEXAMETHASONE SODIUM PHOSPHATE 10 MG/ML IJ SOLN
INTRAMUSCULAR | Status: DC | PRN
Start: 2017-02-17 — End: 2017-02-17
  Administered 2017-02-17: 10 mg via INTRAVENOUS

## 2017-02-17 MED ORDER — PHENOL 1.4 % MT LIQD: 1.0000 | OROMUCOSAL | Status: DC | PRN

## 2017-02-17 MED ORDER — LECITHIN PO GRAN: GRANULES | Freq: Every day | ORAL | Status: DC

## 2017-02-17 MED ORDER — PROPOFOL 10 MG/ML IV BOLUS
INTRAVENOUS | Status: DC | PRN
  Administered 2017-02-17: 100 mg via INTRAVENOUS
  Administered 2017-02-17: 150 mg via INTRAVENOUS

## 2017-02-17 MED ORDER — ARTIFICIAL TEARS OP OINT
TOPICAL_OINTMENT | OPHTHALMIC | Status: DC | PRN
  Administered 2017-02-17: 1 via OPHTHALMIC

## 2017-02-17 MED ORDER — MENTHOL 3 MG MT LOZG: 1.0000 | LOZENGE | OROMUCOSAL | Status: DC | PRN

## 2017-02-17 MED ORDER — BISACODYL 10 MG RE SUPP: 10.0000 mg | Freq: Every day | RECTAL | Status: DC | PRN

## 2017-02-17 MED ORDER — VITAMIN E 180 MG (400 UNIT) PO CAPS
400.0000 [IU] | ORAL_CAPSULE | Freq: Every day | ORAL | Status: DC
  Filled 2017-02-17: qty 1

## 2017-02-17 MED ORDER — VITAMIN D3 25 MCG (1000 UNIT) PO TABS
5000.0000 [IU] | ORAL_TABLET | Freq: Every day | ORAL | Status: DC
  Filled 2017-02-17: qty 5

## 2017-02-17 MED ORDER — BUPIVACAINE HCL (PF) 0.5 % IJ SOLN
INTRAMUSCULAR | Status: DC | PRN
  Administered 2017-02-17: 12 mL

## 2017-02-17 MED ORDER — ACETAMINOPHEN 325 MG PO TABS: 650.0000 mg | ORAL_TABLET | ORAL | Status: DC | PRN

## 2017-02-17 MED ORDER — SUGAMMADEX SODIUM 200 MG/2ML IV SOLN
INTRAVENOUS | Status: AC
  Filled 2017-02-17: qty 2

## 2017-02-17 MED ORDER — ACETAMINOPHEN 650 MG RE SUPP: 650.0000 mg | RECTAL | Status: DC | PRN

## 2017-02-17 MED ORDER — LIDOCAINE-EPINEPHRINE 2 %-1:100000 IJ SOLN
INTRAMUSCULAR | Status: AC
  Filled 2017-02-17: qty 1

## 2017-02-17 MED ORDER — MIDAZOLAM HCL 5 MG/5ML IJ SOLN
INTRAMUSCULAR | Status: DC | PRN
  Administered 2017-02-17: 2 mg via INTRAVENOUS

## 2017-02-17 MED ORDER — FENTANYL CITRATE (PF) 100 MCG/2ML IJ SOLN
INTRAMUSCULAR | Status: AC
  Filled 2017-02-17: qty 2

## 2017-02-17 MED ORDER — FENTANYL CITRATE (PF) 100 MCG/2ML IJ SOLN
INTRAMUSCULAR | Status: DC | PRN
  Administered 2017-02-17: 100 ug via INTRAVENOUS

## 2017-02-17 MED ORDER — ROCURONIUM BROMIDE 50 MG/5ML IV SOSY
PREFILLED_SYRINGE | INTRAVENOUS | Status: AC
  Filled 2017-02-17: qty 5

## 2017-02-17 MED ORDER — SUGAMMADEX SODIUM 200 MG/2ML IV SOLN
INTRAVENOUS | Status: DC | PRN
  Administered 2017-02-17: 150 mg via INTRAVENOUS

## 2017-02-17 MED ORDER — CEPHALEXIN 250 MG PO CAPS
250.0000 mg | ORAL_CAPSULE | Freq: Three times a day (TID) | ORAL | Status: DC
  Filled 2017-02-17: qty 1

## 2017-02-17 MED ORDER — BACITRACIN ZINC 500 UNIT/GM EX OINT
TOPICAL_OINTMENT | CUTANEOUS | Status: AC
  Filled 2017-02-17: qty 28.35

## 2017-02-17 MED ORDER — ZOLPIDEM TARTRATE 5 MG PO TABS: 5.0000 mg | ORAL_TABLET | Freq: Every evening | ORAL | Status: DC | PRN

## 2017-02-17 MED ORDER — VANCOMYCIN HCL 1000 MG IV SOLR
INTRAVENOUS | Status: DC | PRN
  Administered 2017-02-17: 1000 mg

## 2017-02-17 MED ORDER — SODIUM CHLORIDE 0.9% FLUSH: 3.0000 mL | INTRAVENOUS | Status: DC | PRN

## 2017-02-17 MED ORDER — ACETAMINOPHEN 500 MG PO TABS: 1000.0000 mg | ORAL_TABLET | Freq: Four times a day (QID) | ORAL | Status: DC

## 2017-02-17 MED ORDER — SODIUM CHLORIDE 0.9% FLUSH: 3.0000 mL | Freq: Two times a day (BID) | INTRAVENOUS | Status: DC

## 2017-02-17 MED ORDER — BACITRACIN ZINC 500 UNIT/GM EX OINT
TOPICAL_OINTMENT | CUTANEOUS | Status: DC | PRN
  Administered 2017-02-17: 1 via TOPICAL

## 2017-02-17 MED ORDER — DOCUSATE SODIUM 100 MG PO CAPS: 100.0000 mg | ORAL_CAPSULE | Freq: Two times a day (BID) | ORAL | Status: DC

## 2017-02-17 MED ORDER — DEXAMETHASONE SODIUM PHOSPHATE 10 MG/ML IJ SOLN
INTRAMUSCULAR | Status: AC
  Filled 2017-02-17: qty 1

## 2017-02-17 SURGICAL SUPPLY — 58 items
CANISTER SUCT 3000ML PPV (MISCELLANEOUS) ×2 IMPLANT
CARTRIDGE OIL MAESTRO DRILL (MISCELLANEOUS) IMPLANT
CHARGING SYSTEM VERCISE (MISCELLANEOUS) ×2
CORDS BIPOLAR (ELECTRODE) ×2 IMPLANT
DECANTER SPIKE VIAL GLASS SM (MISCELLANEOUS) IMPLANT
DIFFUSER DRILL AIR PNEUMATIC (MISCELLANEOUS) IMPLANT
DRAPE CAMERA VIDEO/LASER (DRAPES) IMPLANT
DRAPE INCISE IOBAN 66X45 STRL (DRAPES) ×2 IMPLANT
DRAPE ORTHO SPLIT 77X108 STRL (DRAPES) ×2
DRAPE POUCH INSTRU U-SHP 10X18 (DRAPES) ×2 IMPLANT
DRAPE SURG ORHT 6 SPLT 77X108 (DRAPES) ×2 IMPLANT
DRSG OPSITE 4X5.5 SM (GAUZE/BANDAGES/DRESSINGS) ×2 IMPLANT
DRSG OPSITE POSTOP 3X4 (GAUZE/BANDAGES/DRESSINGS) ×2 IMPLANT
DRSG OPSITE POSTOP 4X6 (GAUZE/BANDAGES/DRESSINGS) ×2 IMPLANT
DRSG TELFA 3X8 NADH (GAUZE/BANDAGES/DRESSINGS) ×2 IMPLANT
DURAPREP 26ML APPLICATOR (WOUND CARE) ×2 IMPLANT
ELECT CAUTERY BLADE 6.4 (BLADE) ×2 IMPLANT
ELECT REM PT RETURN 9FT ADLT (ELECTROSURGICAL) ×2
ELECTRODE REM PT RTRN 9FT ADLT (ELECTROSURGICAL) ×1 IMPLANT
GAUZE SPONGE 4X4 16PLY XRAY LF (GAUZE/BANDAGES/DRESSINGS) ×2 IMPLANT
GLOVE BIO SURGEON STRL SZ8 (GLOVE) ×4 IMPLANT
GLOVE BIOGEL PI IND STRL 8 (GLOVE) ×1 IMPLANT
GLOVE BIOGEL PI IND STRL 8.5 (GLOVE) ×2 IMPLANT
GLOVE BIOGEL PI INDICATOR 8 (GLOVE) ×1
GLOVE BIOGEL PI INDICATOR 8.5 (GLOVE) ×2
GLOVE ECLIPSE 8.0 STRL XLNG CF (GLOVE) ×2 IMPLANT
GLOVE EXAM NITRILE LRG STRL (GLOVE) IMPLANT
GLOVE EXAM NITRILE XL STR (GLOVE) IMPLANT
GLOVE EXAM NITRILE XS STR PU (GLOVE) IMPLANT
GOWN STRL REUS W/ TWL LRG LVL3 (GOWN DISPOSABLE) ×2 IMPLANT
GOWN STRL REUS W/ TWL XL LVL3 (GOWN DISPOSABLE) IMPLANT
GOWN STRL REUS W/TWL 2XL LVL3 (GOWN DISPOSABLE) ×4 IMPLANT
GOWN STRL REUS W/TWL LRG LVL3 (GOWN DISPOSABLE) ×2
GOWN STRL REUS W/TWL XL LVL3 (GOWN DISPOSABLE)
KIT BASIN OR (CUSTOM PROCEDURE TRAY) ×2 IMPLANT
KIT CONTACT EXTENSION 55CMX8 (Miscellaneous) ×4 IMPLANT
KIT IMPLANT PULSE GENERATOR (Generator) ×2 IMPLANT
KIT REMOTE CONTROL VERCISE (MISCELLANEOUS) ×2 IMPLANT
KIT REMOVER STAPLE SKIN (MISCELLANEOUS) ×2 IMPLANT
KIT ROOM TURNOVER OR (KITS) ×2 IMPLANT
NEEDLE HYPO 25X1 1.5 SAFETY (NEEDLE) ×2 IMPLANT
NS IRRIG 1000ML POUR BTL (IV SOLUTION) ×2 IMPLANT
OIL CARTRIDGE MAESTRO DRILL (MISCELLANEOUS)
PACK LAMINECTOMY NEURO (CUSTOM PROCEDURE TRAY) ×2 IMPLANT
PAD ARMBOARD 7.5X6 YLW CONV (MISCELLANEOUS) ×4 IMPLANT
PASSER CATH 36 CODMAN DISP (NEUROSURGERY SUPPLIES) ×2 IMPLANT
PENCIL BUTTON HOLSTER BLD 10FT (ELECTRODE) ×2 IMPLANT
STAPLER SKIN PROX WIDE 3.9 (STAPLE) ×2 IMPLANT
SUT ETHILON 3 0 PS 1 (SUTURE) IMPLANT
SUT SILK 2 0 FS (SUTURE) ×4 IMPLANT
SUT SILK 2 0 TIES 10X30 (SUTURE) ×2 IMPLANT
SUT VIC AB 2-0 CP2 18 (SUTURE) ×4 IMPLANT
SUT VIC AB 3-0 SH 8-18 (SUTURE) ×2 IMPLANT
SYSTEM CHARGING VERCISE (MISCELLANEOUS) ×1 IMPLANT
TOWEL GREEN STERILE (TOWEL DISPOSABLE) ×4 IMPLANT
TOWEL GREEN STERILE FF (TOWEL DISPOSABLE) ×4 IMPLANT
UNDERPAD 30X30 (UNDERPADS AND DIAPERS) ×2 IMPLANT
WATER STERILE IRR 1000ML POUR (IV SOLUTION) ×2 IMPLANT

## 2017-02-17 NOTE — Progress Notes (Signed)
Patient alert and oriented, mae's well, voiding adequate amount of urine, swallowing without difficulty, c/o pain and . Patient discharged home with family. Script and discharged instructions given to patient. Patient and family stated understanding of d/c instructions given and has an appointment with Dr. Venetia MaxonStern

## 2017-02-17 NOTE — H&P (View-Only) (Signed)
Patient ID:   [id number] Patient: Ian [name]  Date of Birth: [birth year] Visit Type: Office Visit   Date: 12/07/2016 04:00 PM Provider: Danae Orleans. Venetia Maxon MD   This 56 year old male presents for Parkinson's Disease.  History of Present Illness: 1.  Parkinson's Disease    The patient comes in to discuss bilateral STN DBS implantation for Parkinsons.        MEDICATIONS(added, continued or stopped this visit): Started Medication Directions Instruction Stopped   Requip 4 mg tablet take 2 tablet by oral route  every day     [name] 48.75 mg-195 mg capsule,extended release take 1 capsule by oral route 3 times every day       ALLERGIES: Ingredient Reaction Medication Name Comment  NO KNOWN ALLERGIES     No known allergies. Reviewed, no changes.    Vitals Date Temp F BP Pulse Ht In Wt Lb BMI BSA Pain Score  12/07/2016  128/68 89 74 170 21.83  0/10      IMPRESSION The patient has had a few months to think and he is ready to schedule bilateral STN DBS implantation. I discussed the details of surgery in depth with the patient and his wife today and answered all their questions.   Completed Orders (this encounter) Order Details Reason Side Interpretation Result Initial Treatment Date Region  Hypertension education Continue to monitor blood pressure. If remains elevated, contact primary care physician.         Assessment/Plan # Detail Type Description   1. Assessment Elevated blood-pressure reading, w/o diagnosis of htn (R03.0).         Pain Assessment/Treatment Pain Scale: 0/10. Method: Numeric Pain Intensity Scale. Onset: 04/27/1996.  Schedule bilateral STN DBS. Nurse education given.   Orders: Instruction(s)/Education: Assessment Instruction  R03.0 Hypertension education             Provider:  Danae Orleans. Venetia Maxon MD  12/07/2016 04:34 PM Dictation edited by: Gabriel Rainwater    CC Providers: Kerin Salen [address] [place], [place]  [address]              Electronically signed by Danae Orleans. Venetia Maxon MD on 12/11/2016 01:13 PM  Patient ID:   [id number] Patient: Ian [name]  Date of Birth: [birth year] Visit Type: Office Visit   Date: 04/27/2016 10:30 AM Provider: Danae Orleans. Venetia Maxon MD   This 56 year old male presents for Parkinson's Disease.  History of Present Illness: 1.  Parkinson's Disease    Ian [name], 56 year old retired male, visits on Dr. Lurena Joiner [name]'s referral to discuss deep brain stimulation for Parkinson's.  He has noticed improvements with [name] 45/195 qid and Requip  2bid.  Tremor, freezing, and sleep continue to affect him daily. He notes inconsistent symptoms with his current course of medications. Occasional dyskinesias. He states no cognitive issues.   Parkinson's since 2014. Retired Runner, broadcasting/film/video.        PAST MEDICAL/SURGICAL HISTORY   (Detailed)  Disease/disorder Onset Date Management Date Comments  Parkinson disease         PAST MEDICAL HISTORY, SURGICAL HISTORY, FAMILY HISTORY, SOCIAL HISTORY AND REVIEW OF SYSTEMS  04/27/2016, which I have signed.  Family History  (Detailed) Relationship Family Member Name Deceased Age at Death Condition Onset Age Cause of Death  Daughter  N  Alive and well    Father    Diabetes mellitus  N  Mother    breat  N  Sister  N  Alive and well  Son  [employer]  Alive and well      SOCIAL HISTORY  (Detailed) Tobacco use reviewed. Preferred language is Unknown.   Smoking status: Never smoker.  SMOKING STATUS Use Status Type Smoking Status Usage Per Day Years Used Total Pack Years  no/never  Never smoker       HOME ENVIRONMENT/SAFETY The patient has not fallen in the last year.        MEDICATIONS(added, continued or stopped this visit): Started Medication Directions Instruction Stopped   Requip 4 mg tablet take 2 tablet by oral route  every day     [name] 48.75 mg-195 mg capsule,extended release take 1  capsule by oral route 3 times every day       ALLERGIES: Ingredient Reaction Medication Name Comment  NO KNOWN ALLERGIES     No known allergies.    Vitals Date Temp F BP Pulse Ht In Wt Lb BMI BSA Pain Score  04/27/2016  102/64 69 74 172.6 22.16  3/10     PHYSICAL EXAM General Level of Distress: no acute distress Overall Appearance: normal  Head and Face  Right Left  Fundoscopic Exam:  normal normal    Cardiovascular Cardiac: regular rate and rhythm without murmur  Right Left  Carotid Pulses: normal normal  Respiratory Lungs: clear to auscultation  Neurological Orientation: normal Recent and Remote Memory: normal Attention Span and Concentration:   normal Language: normal Fund of Knowledge: normal  Right Left Sensation: normal normal Upper Extremity Coordination: normal normal  Lower Extremity Coordination: normal normal  Musculoskeletal Gait and Station: normal  Right Left Upper Extremity Muscle Strength: normal normal Lower Extremity Muscle Strength: normal normal Upper Extremity Muscle Tone:  normal normal Lower Extremity Muscle Tone: normal normal  Motor Strength Upper and lower extremity motor strength was tested in the clinically pertinent muscles.     Deep Tendon Reflexes  Right Left Biceps: normal normal Triceps: normal normal Brachiloradialis: normal normal Patellar: normal normal Achilles: normal normal  Sensory Sensation was tested at L1 to S1.   Cranial Nerves II. Optic Nerve/Visual Fields: normal III. Oculomotor: normal IV. Trochlear: normal V. Trigeminal: normal VI. Abducens: normal VII. Facial: normal VIII. Acoustic/Vestibular: normal IX. Glossopharyngeal: normal X. Vagus: normal XI. Spinal Accessory: normal XII. Hypoglossal: normal  Motor and other Tests Lhermittes: negative Rhomberg: negative Pronator  drift: absent     Right Left Spurlings negative negative Hoffman's: normal normal Clonus: normal normal Babinski: normal normal SLR: negative negative Patrick's Pearlean Brownie(Faber): negative negative Toe Walk: normal normal Toe Lift: normal normal Heel Walk: normal normal Tinels Elbow: negative negative Tinels Wrist: negative negative SI Joint: nontender nontender Phalen: negative negative   Additional Findings:  cogwheeling rigidty, rest tremor    IMPRESSION The patient is diagnosed with Parkinson's and is interested in proceeding with DBS. We discussed risk and benefits of bilateral deep brain stimulation and surgical procedures. I showed him associated stimulation hardware. I agree with Dr.[name] and believe the patient would be a good candidate for bilateral STN DBS. We discussed potential mid-June or August surgery, he has a family vacation planned in late June.   Pain Assessment/Treatment Pain Scale: 3/10. Method: Numeric Pain Intensity Scale. Location: back. Onset: 04/27/1996. Duration: varies. Quality: discomforting. Pain Assessment/Treatment follow-up plan of care: Patient is taking over the counter pain relievers for relief..  Fall Risk Plan The patient has not fallen in the last year.  Schedule for bilateral STN DBS             Provider:  Danae Orleans. Venetia Maxon MD  04/27/2016 11:45 AM Dictation edited by: Gabriel Rainwater    CC Providers: Kerin Salen [address] [place], [place] [address]              Electronically signed by Danae Orleans. Venetia Maxon MD on 04/27/2016 12:12 PM

## 2017-02-17 NOTE — Interval H&P Note (Signed)
History and Physical Interval Note:  02/17/2017 7:58 AM  Ian PurserKenneth [name]  has presented today for surgery, with the diagnosis of Parkinson Disease  The various methods of treatment have been discussed with the patient and family. After consideration of risks, benefits and other options for treatment, the patient has consented to  Procedure(s) with comments: Right implantable pulse generator placement (Right) - Bilateral implantable pulse generator placement as a surgical intervention .  The patient's history has been reviewed, patient examined, no change in status, stable for surgery.  I have reviewed the patient's chart and labs.  Questions were answered to the patient's satisfaction.     Ian [name]

## 2017-02-17 NOTE — Anesthesia Procedure Notes (Signed)
Procedure Name: Intubation Date/Time: 02/17/2017 7:37 AM Performed by: Izora Gala Pre-anesthesia Checklist: Patient identified, Emergency Drugs available, Suction available and Patient being monitored Patient Re-evaluated:Patient Re-evaluated prior to inductionOxygen Delivery Method: Circle system utilized Intubation Type: IV induction Ventilation: Mask ventilation without difficulty Laryngoscope Size: Miller and 3 Grade View: Grade I Tube type: Oral Tube size: 7.5 mm Number of attempts: 1 Airway Equipment and Method: Stylet and LTA kit utilized Placement Confirmation: ETT inserted through vocal cords under direct vision,  positive ETCO2 and breath sounds checked- equal and bilateral Secured at: 23 cm Tube secured with: Tape Dental Injury: Teeth and Oropharynx as per pre-operative assessment

## 2017-02-17 NOTE — Transfer of Care (Signed)
Immediate Anesthesia Transfer of Care Note  Patient: Ian PurserKenneth [name]  Procedure(s) Performed: Procedure(s) with comments: Right implantable pulse generator placement (Right) - Bilateral implantable pulse generator placement  Patient Location: PACU  Anesthesia Type:General  Level of Consciousness: awake, alert , oriented and patient cooperative  Airway & Oxygen Therapy: Patient Spontanous Breathing and Patient connected to nasal cannula oxygen  Post-op Assessment: Report given to RN and Post -op Vital signs reviewed and stable  Post vital signs: Reviewed and stable  Last Vitals:  Vitals:   02/17/17 0559  BP: (!) 134/56  Pulse: 71  Resp: 18  Temp: 36.7 C    Last Pain:  Vitals:   02/17/17 0559  TempSrc: Oral         Complications: No apparent anesthesia complications

## 2017-02-17 NOTE — Brief Op Note (Signed)
02/17/2017  9:04 AM  PATIENT:  Ian [name]  56 y.o. male  PRE-OPERATIVE DIAGNOSIS:  Parkinson Disease  POST-OPERATIVE DIAGNOSIS:  Parkinson Disease  PROCEDURE:  Procedure(s) with comments: Right implantable pulse generator placement (Right) - Bilateral  STN DBS leads  SURGEON:  Surgeon(s) and Role:    * Maeola HarmanJoseph Daquawn Seelman, MD - Primary  PHYSICIAN ASSISTANT:   ASSISTANTS: Poteat, RN   ANESTHESIA:   general  EBL:  Total I/O In: 1000 [I.V.:1000] Out: 5 [Blood:5]  BLOOD ADMINISTERED:none  DRAINS: none   LOCAL MEDICATIONS USED:  MARCAINE    and LIDOCAINE   SPECIMEN:  No Specimen  DISPOSITION OF SPECIMEN:  N/A  COUNTS:  YES  TOURNIQUET:  * No tourniquets in log *  DICTATION: Patient has implanted bilateral subthalamic stimulator electrodes having recently completed DBS Stage I and now presents for placement of lead extensions and IPG implantation with both leads on the right.  PROCEDURE: Patient was brought to the operating room and GETA anesthesia was induced.  Right upper chest, scalp, neck were prepped with betadine scrub and Duraprep.  Area of planned incision was infiltrated with lidocaine.  Scalp incision was made and the lead extensions were exposed. An incision was made in the right upper chest and a pocket was created.  Extensions were tunneled from scalp to pocket.  [employer] rechargeable IPG was placed and attached to lead extensions, which in turn were connected to cranial leads and torqued appropriately.  The IPG  was placed in the pocket and anchored with 2, 2-0 silk sutures.  Wounds were irrigated with vancomycin.  Incisions were closed with 2-0 Vicryl and 3-0 vicryl sutures at the pocket and 2-0 vicryl at the scalp with staples. and dressed with a sterile occlusive dressing.  Impedences were checked prior to closure and all were within normal limits. Counts were correct at the end of the case.   PLAN OF CARE: Admit to inpatient   PATIENT DISPOSITION:  PACU -  hemodynamically stable.   Delay start of Pharmacological VTE agent (>24hrs) due to surgical blood loss or risk of bleeding: yes

## 2017-02-17 NOTE — Op Note (Signed)
02/17/2017  9:04 AM  PATIENT:  Ian [name]  55 y.o. male  PRE-OPERATIVE DIAGNOSIS:  Parkinson Disease  POST-OPERATIVE DIAGNOSIS:  Parkinson Disease  PROCEDURE:  Procedure(s) with comments: Right implantable pulse generator placement (Right) - Bilateral  STN DBS leads  SURGEON:  Surgeon(s) and Role:    * Kaelan Amble, MD - Primary  PHYSICIAN ASSISTANT:   ASSISTANTS: Poteat, RN   ANESTHESIA:   general  EBL:  Total I/O In: 1000 [I.V.:1000] Out: 5 [Blood:5]  BLOOD ADMINISTERED:none  DRAINS: none   LOCAL MEDICATIONS USED:  MARCAINE    and LIDOCAINE   SPECIMEN:  No Specimen  DISPOSITION OF SPECIMEN:  N/A  COUNTS:  YES  TOURNIQUET:  * No tourniquets in log *  DICTATION: Patient has implanted bilateral subthalamic stimulator electrodes having recently completed DBS Stage I and now presents for placement of lead extensions and IPG implantation with both leads on the right.  PROCEDURE: Patient was brought to the operating room and GETA anesthesia was induced.  Right upper chest, scalp, neck were prepped with betadine scrub and Duraprep.  Area of planned incision was infiltrated with lidocaine.  Scalp incision was made and the lead extensions were exposed. An incision was made in the right upper chest and a pocket was created.  Extensions were tunneled from scalp to pocket.  [employer] rechargeable IPG was placed and attached to lead extensions, which in turn were connected to cranial leads and torqued appropriately.  The IPG  was placed in the pocket and anchored with 2, 2-0 silk sutures.  Wounds were irrigated with vancomycin.  Incisions were closed with 2-0 Vicryl and 3-0 vicryl sutures at the pocket and 2-0 vicryl at the scalp with staples. and dressed with a sterile occlusive dressing.  Impedences were checked prior to closure and all were within normal limits. Counts were correct at the end of the case.   PLAN OF CARE: Admit to inpatient   PATIENT DISPOSITION:  PACU -  hemodynamically stable.   Delay start of Pharmacological VTE agent (>24hrs) due to surgical blood loss or risk of bleeding: yes  

## 2017-02-17 NOTE — Anesthesia Postprocedure Evaluation (Addendum)
Anesthesia Post Note  Patient: Ian PurserKenneth [name]  Procedure(s) Performed: Procedure(s) (LRB): Right implantable pulse generator placement (Right)  Patient location during evaluation: PACU Anesthesia Type: General Level of consciousness: awake and alert Pain management: pain level controlled Vital Signs Assessment: post-procedure vital signs reviewed and stable Respiratory status: spontaneous breathing, nonlabored ventilation, respiratory function stable and patient connected to nasal cannula oxygen Cardiovascular status: blood pressure returned to baseline and stable Postop Assessment: no signs of nausea or vomiting Anesthetic complications: no       Last Vitals:  Vitals:   02/17/17 1125 02/17/17 1145  BP: 138/76 130/75  Pulse: 81 100  Resp: 16 16  Temp: 36.6 C 36.9 C    Last Pain:  Vitals:   02/17/17 1145  TempSrc: Oral  PainSc: 1                  Shelton SilvasKevin D Hollis

## 2017-02-17 NOTE — Progress Notes (Signed)
Awake, alert, conversant.  MAEW.  Doing well.  

## 2017-02-17 NOTE — Evaluation (Signed)
Physical Therapy Evaluation and Discharge Patient Details Name: Ian PurserKenneth [name] MRN: [id number] DOB: 1961/07/01 Today's Date: 02/17/2017   History of Present Illness  Pt is a 56 y/o male who presents s/p R implantable pulse generator placement for Parkinson's on 02/17/17.  Clinical Impression  Patient evaluated by Physical Therapy with no further acute PT needs identified. All education has been completed and the patient has no further questions. At the time of PT eval pt was able to perform transfers and ambulation with gross modified independence. Discussed general safety precautions with pt and wife. See below for any follow-up Physical Therapy or equipment needs. PT is signing off. Thank you for this referral.     Follow Up Recommendations No PT follow up    Equipment Recommendations  None recommended by PT    Recommendations for Other Services       Precautions / Restrictions Precautions Precautions: None Restrictions Weight Bearing Restrictions: No      Mobility  Bed Mobility Overal bed mobility: Independent                Transfers Overall transfer level: Modified independent Equipment used: None Transfers: Sit to/from Stand           General transfer comment: Increased time to gain/maintain balance however no assist was required and no gross LOB.   Ambulation/Gait Ambulation/Gait assistance: Modified independent (Device/Increase time) Ambulation Distance (Feet): 400 Feet Assistive device: None Gait Pattern/deviations: Step-through pattern;Drifts right/left Gait velocity: Decreased Gait velocity interpretation: Below normal speed for age/gender General Gait Details: No loss of balance. Slight drift with occasional side step to catch balance. Pt did not require assist but recommended wife be with him for long walks at home.   Stairs Stairs: Yes Stairs assistance: Modified independent (Device/Increase time) Stair Management: One rail Right;Alternating  pattern;Forwards Number of Stairs: 10 General stair comments: Generally steady  Wheelchair Mobility    Modified Rankin (Stroke Patients Only)       Balance Overall balance assessment: Needs assistance Sitting-balance support: Feet supported;No upper extremity supported Sitting balance-Leahy Scale: Good     Standing balance support: During functional activity;No upper extremity supported Standing balance-Leahy Scale: Fair                               Pertinent Vitals/Pain Pain Assessment: Faces Faces Pain Scale: Hurts a little bit Pain Location: head Pain Descriptors / Indicators: Discomfort Pain Intervention(s): Monitored during session;Limited activity within patient's tolerance;Repositioned    Home Living Family/patient expects to be discharged to:: Private residence Living Arrangements: Spouse/significant other Available Help at Discharge: Family;Available PRN/intermittently;Available 24 hours/day Type of Home: House Home Access: Stairs to enter Entrance Stairs-Rails: Right Entrance Stairs-Number of Steps: 1-2 Home Layout: Two level;Able to live on main level with bedroom/bathroom Home Equipment: None      Prior Function Level of Independence: Independent         Comments: Exercises regularly.     Hand Dominance   Dominant Hand: Right    Extremity/Trunk Assessment   Upper Extremity Assessment Upper Extremity Assessment: Defer to OT evaluation    Lower Extremity Assessment Lower Extremity Assessment: Overall WFL for tasks assessed    Cervical / Trunk Assessment Cervical / Trunk Assessment: Normal  Communication   Communication: No difficulties  Cognition Arousal/Alertness: Awake/alert Behavior During Therapy: WFL for tasks assessed/performed Overall Cognitive Status: Within Functional Limits for tasks assessed  General Comments      Exercises     Assessment/Plan    PT Assessment Patent does not  need any further PT services  PT Problem List         PT Treatment Interventions      PT Goals (Current goals can be found in the Care Plan section)  Acute Rehab PT Goals Patient Stated Goal: Home today PT Goal Formulation: All assessment and education complete, DC therapy    Frequency     Barriers to discharge        Co-evaluation               End of Session Equipment Utilized During Treatment: Gait belt Activity Tolerance: Patient tolerated treatment well Patient left: in bed;with call bell/phone within reach;with family/visitor present Nurse Communication: Mobility status PT Visit Diagnosis: Unsteadiness on feet (R26.81)         Time: 1610-9604 PT Time Calculation (min) (ACUTE ONLY): 11 min   Charges:   PT Evaluation $PT Eval Moderate Complexity: 1 Procedure     PT G CodesMarylynn Pearson 02/17/2017, 2:04 PM   Conni Slipper, PT, DPT Acute Rehabilitation Services Pager: [phone]

## 2017-02-20 NOTE — Discharge Summary (Signed)
Physician Discharge Summary  Patient ID: Ian [name] MRN: [id number] DOB/AGE: 07/02/1961 56 y.o.  Admit date: 02/17/2017 Discharge date: 02/20/2017  Admission Diagnoses:Parkinson's Disease  Discharge Diagnoses: Same Active Problems:   Parkinson's disease [facility](HCC)   Discharged Condition: good  Hospital Course: Patient underwent implantation of electrode extensions and implantable pulse generator as planned second stage deep brain stimulation surgery for Parkinson's Disease.  He did well with surgery and was discharged home later that day.  Consults: None  Significant Diagnostic Studies: None  Treatments: surgery: implantation of electrode extensions and implantable pulse generator  Discharge Exam: Blood pressure 130/75, pulse 100, temperature 98.4 F (36.9 C), temperature source Oral, resp. rate 16, height 6\' 2"  (1.88 m), weight 78 kg (172 lb), SpO2 98 %. Neurologic: Alert and oriented X 3, normal strength and tone. Normal symmetric reflexes. Normal coordination and gait Wound:CDI  Disposition: Home   Allergies as of 02/17/2017      Reactions   No Known Allergies       Medication List    ASK your doctor about these medications   carbidopa-levodopa 25-100 MG tablet Commonly known as:  SINEMET IR take 1 tablet by mouth twice a day   [name] 48.75-195 MG Cpcr Generic drug:  Carbidopa-Levodopa ER take 4 capsules by mouth every morning and 3 capsules three times a day   cephALEXin 250 MG capsule Commonly known as:  KEFLEX Take 250 mg by mouth 3 (three) times daily.   HYDROcodone-acetaminophen 5-325 MG tablet Commonly known as:  NORCO/VICODIN Take 1 tablet by mouth every 6 (six) hours as needed.   LECITHIN PO Take 1 capsule by mouth daily.   MULTI-VITAMINS Tabs Take 1 tablet by mouth 2 (two) times daily.   niacin 500 MG tablet Take 500 mg by mouth 3 (three) times daily with meals.   ondansetron 4 MG tablet Commonly known as:  ZOFRAN Take 1 tablet (4 mg total) by  mouth every 8 (eight) hours as needed for nausea or vomiting.   rOPINIRole 4 MG 24 hr tablet Commonly known as:  [name] Take 2 tablets (8 mg total) by mouth 2 (two) times daily.   vitamin C 1000 MG tablet Take 5,000 mg by mouth daily.   Vitamin D-3 5000 units Tabs Take 5,000 Units by mouth daily.   vitamin E 400 UNIT capsule Take 400 Units by mouth daily.        Signed: Dorian HeckleSTERN,Jarren Para D, MD 02/20/2017, 9:04 AM

## 2017-02-22 ENCOUNTER — Encounter (HOSPITAL_COMMUNITY): Payer: Self-pay | Admitting: Neurosurgery

## 2017-03-06 ENCOUNTER — Encounter: Payer: Self-pay | Admitting: Neurology

## 2017-03-10 ENCOUNTER — Encounter: Payer: Self-pay | Admitting: Neurology

## 2017-03-10 NOTE — Progress Notes (Signed)
Ian PurserKenneth Wilk was seen today in the movement disorders clinic for neurologic consultation at the request of his PCP, [name].  He has previously seen  [name],[name], [name] in [place].   This patient is accompanied in the office by his spouse who supplements the history.The consultation is for the evaluation of PD.  I did review [name]'s notes.  [name]'s notes are not available to me.    Hist first sx was tremor was R arm tremor.  It was in 2011 according to the patient today, but [name]'s notes indicate that perhaps this was in 2009.  He was dx with enhanced physiologic tremor.  He was started on propranolol with some help, but made him sluggish.  He went to see [name] in 2013 for an opinion in [name].  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  He was placed on baclofen and requip.  He thinks that the requip has helped; he is able to turn over in the bed better and move better.  The pt first saw [name] on 09/10/13.  [name] agreed with the Requip and start the patient on [name], but the patient is currently only on its once per day.  12/04/13 update:  The pt presents to the clinic today for his PD.  He is exercising faithfully.  He attends the PD exercise class through the rehab center. The pt was changed to the IR formulation of carbidopa/levodopa 25/100 last visit and is on it tid.  He is not sure it helps.  Afternoons are better than mornings.  carbidopa/levodopa 25/100 is taken at 8am/3:30 pm and bedtime. He is also on requip xl 6 mg daily.  Continues to have significant tremor.  01/27/14 update:  The patient presents today with his wife, who supplements the history.  The pt has a hx of PD and last visit, we decided to increase his carbidopa/levodopa to 2 po tid.  the patient states that he just felt lightheaded on this medication dosage  and up going back to [name] 3 times per day and then takes [name], half tablet twice per day. He remains on requip xl 6 mg daily.  Overall, he does feel fairly good.  He still has tremor.  He went to see [name] since our last visit and again talked about DBS therapy.  [name] mentioned to him that he looks much better and he had previously and had [name] score had declined.  Some yelling out in the sleep but this has not been a huge issue.  No sleepwalking/talking.  No falls.  He is exercising faithfully.  05/28/14 update:  The patient is presenting today for followup.  Last visit, I increased his [name] from 6 mg daily to 4 mg twice a day.  He is doing well with this.  Tremor is about the same.  He is on a strange combination of carbidopa levodopa, but when I tried to switch him off the IR version, the patient just felt lightheaded and ended up going back to a combination of IR and CR.  He is currently on 25/100 CR 3 times per day and then takes [name], half tablet twice per day and sometimes three times per day.  He does best in the evening and worst in the AM.   No swallowing troubles.  Has  dropped weight but thinks that it is exercise and was trying to follow gluten free diet. He is walking, doing a spinning class for PD, and has started golfing again, which he hasn't done in years.   Still considering DBS therapy.    08/27/14 update:  Patient presents today for followup.  He is currently on [name], 4mg  twice a day.  He is on a strange combination of carbidopa levodopa, but hasn't been able to tolerate the IR version well.[name].  He is currently on 25/100 CR 2-3 times per day and then 50/200 at night.  He rarely takes the IR now; only if playing golf or if he needs a boost during the day.  He is finding that he is having more tremor.  He is also having dreams at night that are making his sleep feel unrestful.  He is strongly  considering deep brain stimulation.  He asks me about details regarding this today.  He has asks me about potentially getting a refill on his Ativan.  He apparently got this filled from an outside physician about a year and a half ago and only had 10 pills in total and is just now running out.  He takes one pill every 3 or 4 months if he has to go to a function with his wife.  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  He asks me if he can have a refill today.  09/10/14 update:  The patient is seen today unexpectedly.  Last visit, I started [name] 145 mg, 2 tablets 3 times per day.  He is also on [name] 4 mg twice a day.  He called me initially to tell me that the [name] was working great and he felt much better on the medication.  In fact, he states that his PT and OT reported that he looked much better and his eval scores were markedly improved and they asked him what he had done differently.  He states last week was just a great week.  On Saturday, he was just a little lightheaded and his wife checked his orthostatics and he states that they were negative.  This week, however, he began to report some abnormal movements.  The movements were primarily of the left shoulder and in the abdomen. I wanted to see him on this medication before added or changed anything, as I have never seen him with dyskinesia.  Unfortunately, this morning he only took one of his tablets, but he did take 2 tablets at lunch time about 1 hour before coming.  He has not noticed the movements today.  He has noticed much less tremor overall since starting [name].  11/25/14 update:  The patient returns today for follow-up.  He is on [name] 4 mg twice a day and [name] 145 mg, 2 tablets 3 times per day.  His biggest issue is tremor, but states that its under good control today (but he just exercised).  He has developed tendinitis in the elbow.  He has clonazepam for REM behavior  disorder but admits he doesn't use it.  The patient remains very active in our Parkinson's exercise classes.  No falls.  Some days he does have to "fight" the posture and the "shuffling."  He didn't ever get to see Dr. Venetia MaxonStern as he states that his life got hectic moving his mother in law.    02/20/15 update:  The patient returns today, accompanied by his wife  who supplements the history.  Last visit, I increased his [name] to 195 mg, and he is taking 2 tablets 3 times a day.  He is also on [name] 4 mg twice a day.  He reports that he has not been doing as well because stress levels have been increased.  His mother-in-law, who is living with them, died unexpectedly.  Because of that, he has had more trouble sleeping as well.  He is still faithfully attending exercise classes and is involved in the Parkinson's community.  He has noticed more rigidity and tremor.  He is taking melatonin, 5 Mill grams at night to help him sleep but does not want to use clonazepam, even though he has at home.  05/24/25 update:  The patient returns today for follow-up.  His wife accompanies him and supplements the history.  He is on [name] 4 mg twice a day and [name] 195 mg, 2 tablets 3 times per day.  He has not wanted to increase the dose, although he likely could use more levodopa. States that he is more tremulous right now and attributes that to the fact that he has had a GI illness recently, which made sx's worse.  He has had emesis (one episode).  No diarrhea.  No fevers.    He has not had any falls.  He denies any hallucinations.  No confusion.  No lightheadedness or near syncope.  He continues to exercise faithfully.  06/12/15 update:  The patient is following up today, accompanied by his wife who supplements the history.  When I saw him last on 05/22/15, he was obviously underdosed and I expressed the need for more levodopa but the pt didn't want to increase it as he felt that his viral gastroenteritis was just causing his  worsening sx's.  He didn't tell me then that he had been off of his meds for 3 days and that is the reason he didn't look well.  They called me on 06/10/2015 however to state that he was doing much worse.  They wanted me to work him in that day, but I was not able to.  I didn't realize that he had been off all his PD meds since June 1 and had started receiving care from a natureopath in [place], Dr. Beckie BusingUrban.  He was given information on Dr. Beckie BusingUrban by his dentist Dr. Mellody DanceKeith, who told him he should try amino acid therapy.  He researched and started seeing Dr. Beckie BusingUrban and was told he needed to be off all his PD meds.  He started on D5-mucuna which markets itself as a "safe, natural" levodopa therapy.  He was told that he needed 4-6 months to see a response, but admits that he was told to tell his doctor what he was doing.  He was increasing the dose of this supplement based on urine dopamine levels which they were told were low.  They apparently were also measuring urine serotonin levels.  Not surprisingly, the patient decompensated.  He states that initially he didn't "feel that bad" but the "tremor was awful."  He also states that he had terrible vivid dreams and ultimately became exhausted from not sleeping, shaking and increased rigidity. It got so bad that he ended up in the ER 2 days ago.    He is back on rytary 195, 2 po tid.  He is a little quesy since being back on it.  He has only been on it for 2 days.  He states that he  talked with the natureopath and she told him to just "start back on it."  He is off of the D5-mucuna.  He and his wife bring in several articles for me to read today.    09/29/15 update:  The patient is following up today, accompanied by his wife who supplements the history.  He is on [name] 4 mg twice a day.  He reports that he is now taking  [name] 195 mg, 2 tablets 3 times a day along with [name] 145 mg, one tablet 3 times a day.  He spreads it out so that he takes it at 8 AM/3 PM/11 PM.   He does not necessarily halfway he describes as "traditional wearing off" but states that at the end of the dose he just does not feel good.  Overall, the patient states that he has been doing okay but he also feels that things are more unpredictable.  He feels more tired and thinks that he has mild dyskinesia.  When he eats, he is biting his lip in the same place.   He denies any lightheadedness or near syncope.  He denies falls.  When asked about depression, he states that he thinks "depression is a strong word" but admits that he gets frustrated and overwhelmed about the disease but uses exercise as a release.  He has sent me paperwork since last visit for his disability which has been completed.  He asks me about writing another letter regarding his VA disability as well.  12/30/15 update:  The patient is following up today.  Last visit, I increased his [name] to 6 mg twice a day.  His [name] has been slightly increased, so that he is on 195 mg, 3 tablets 3 times per day and he added an additional 1 tablet at night because he was awakening at 4 AM with tremor. He has changed that again, but taking the same overall dose of rytary, but is now taking 195, 3 in the AM, 2 in the afternoon, 3 in the evening and then takes 2 at bedtime.   Overall, he thinks he feels that it is a little inconsistent; sometimes he does well and sometimes he doesn't.  He continues to faithfully exercise.  No falls.  No hallucinations.  No syncope but has had some lightheadedness; he has had a uri and isn't sure if it is related to that/meds for that.  Mood has been good.  Sleep has been better since he has been back on his medications.  Appetite has been good.  No new medical issues.  He is still going to integrative therapies and thinks that it is helpful.  However, he did some therapy called alpha stim and was told that it changed the alpha waves in the brain and he didn't think that it was helpful and thinks that it made him feel  strange.  Feels that he has minor dyskinesia, especially of the arm when he walks or bends over.  Asks about DBS therapy and thinks he is going to consider that more this year.    03/03/16 update:  The patient is following up today.  He is on [name], 6 mg twice a day and [name] 195 mg on the following schedule: 3/3/3/2.  This is a little higher than last visit (prior 3/2/3/2).  He denies any falls since last visit.  He denies lightheadedness or near syncope.  No hallucinations.  Tremor has been pretty good, but it will fluctuate throughout the day (  states that he did 2 workouts today).  He does occasionally feel like he has some dyskinesia.  He saw Dr. Terrilee FilesZach Smith on February 6 and March 13 and I reviewed those records.  OMT was performed.  The patient feels that that was helpful for his musculoskeletal complaints.  He is doing lots of exercises.  Mood is stable.  Asks about meeting with Dr. Venetia MaxonStern for DBS but wife not ready to pursue.    06/24/16 update:  The patient is following up today, accompanied by his wife who supplements the history.    He is here for her levodopa challenge test.  He saw Dr. Venetia MaxonStern in May and is interested in DBS therapy.  Last visit, I increased his [name] so that he is taking 4 mg, 2 tablets twice a day.  He remains on [name] 195 mg on the following schedule: 3/3/3/2.  He takes clonazepam as needed for REM behavior disorder.  He has not had falls since last visit.  He has seen Dr. Terrilee FilesZach Smith several times and I reviewed those records.  He has not had any lightheadedness or near syncope.  He continues to exercise faithfully.  10/18/16 update:  Pt f/u today.  Wife doesn't accompany today.  He saw Dr. Alinda DoomsBailar since our last visit and no evidence of cognitive deficits.  Pt on [name] 4 mg - 2 po bid and rytary 195 mg: and he has changed that from 3/3/3/2 to 4/3/3/3.  He has a little more off time with more tremor and slowness.  In late summer, he was putting a putting green into his  back yard and states that he tore an intercostal muscle so he couldn't exercise for 8-9 weeks because of pain with breathing.  Started back to working out 3-4 weeks ago.   01/14/16 update:  Patient follows up today, as he has some questions regarding DBS therapy.  Since our last visit, the [facility] device for deep brain stimulation has been FDA approved.  The patient and I discussed the device over the telephone, and he subsequently read about the device and thought that he would prefer to have this particular device and presents today to talk about it in more detail.  He remains on [name], 4 mg, and takes 2 tablets twice per day.  He also takes [name] 195 mg: 4/3/3/3.  Last visit, I gave him some carbidopa/levodopa 25/100 to use as needed and he states that he tried that but it didn't work well for him and so he isn't using that much.  01/31/17 update:  Patient follows up today for preop DBS recording.  Wife present and supplements history.  He had fiducials placed today.  He was to hold his meds this AM but didn't and feels like he is "on."    03/13/17 update:  Patient follows up today.  Patient is accompanied by his twin sister and wife who supplements the history.  The patient underwent bilateral STN DBS with [facility] device on 02/10/2017 and had IPG placement on 02/17/2017.  The patient has recovered nicely postoperatively.  He had some increase dyskinesia and emailed me about that.  I did tell him he could slightly decrease his [name] if he would like.  He states that he has been taking [name] 195, 3 po tid and [name], 4 mg, 2 tablets twice per day.  He held his Requip today and last took [name], 3 of them at 8 am.  He has had no  falls.  No lightheadedness or near syncope.   PREVIOUS MEDICATIONS: Requip and neupro (tried few days only); propranolol helped tremor some  ALLERGIES:   Allergies  Allergen Reactions  . No Known Allergies     CURRENT MEDICATIONS:   Allergies as  of 03/13/2017      Reactions   No Known Allergies       Medication List       Accurate as of 03/13/17  5:01 PM. Always use your most recent med list.          LECITHIN PO Take 1 capsule by mouth daily.   MULTI-VITAMINS Tabs Take 1 tablet by mouth 2 (two) times daily.   niacin 500 MG tablet Take 500 mg by mouth 3 (three) times daily with meals.   rOPINIRole 4 MG 24 hr tablet Commonly known as:  [name] Take 2 tablets (8 mg total) by mouth 2 (two) times daily.   [name] 48.75-195 MG Cpcr Generic drug:  Carbidopa-Levodopa ER take 4 capsules by mouth every morning and 3 capsules three times a day   vitamin C 1000 MG tablet Take 5,000 mg by mouth daily.   Vitamin D-3 5000 units Tabs Take 5,000 Units by mouth daily.   vitamin E 400 UNIT capsule Take 400 Units by mouth daily.        PAST MEDICAL HISTORY:   Past Medical History:  Diagnosis Date  . History of kidney stones    passed  . PD (Parkinson's disease) (HCC) 09/10/2013  . PONV (postoperative nausea and vomiting)    nausea     PAST SURGICAL HISTORY:   Past Surgical History:  Procedure Laterality Date  . COLONOSCOPY    . MINOR PLACEMENT OF FIDUCIAL N/A 01/31/2017   Procedure: MINOR PLACEMENT OF FIDUCIAL;  Surgeon: Maeola Harman, MD;  Location[facility] OR;  Service: Neurosurgery;  Laterality: N/A;  Fiducial placement  . none    . PULSE GENERATOR IMPLANT Right 02/17/2017   Procedure: Right implantable pulse generator placement;  Surgeon: Maeola Harman, MD;  Location: [facility] OR;  Service: Neurosurgery;  Laterality: Right;  Bilateral implantable pulse generator placement  . SUBTHALAMIC STIMULATOR INSERTION Bilateral 02/10/2017   Procedure: Bilateral Deep brain stimulator placement;  Surgeon: Maeola Harman, MD;  Location: [facility] OR;  Service: Neurosurgery;  Laterality: Bilateral;  Bilateral Deep brain stimulator placement  . UPPER GASTROINTESTINAL ENDOSCOPY      SOCIAL HISTORY:   Social History   Social History  . Marital  status: Married    Spouse name: N/A  . Number of children: N/A  . Years of education: N/A   Occupational History  . Not on file.   Social History Main Topics  . Smoking status: Never Smoker  . Smokeless tobacco: Never Used  . Alcohol use Yes     Comment: Occ  . Drug use: No  . Sexual activity: Not on file   Other Topics Concern  . Not on file   Social History Narrative  . No narrative on file    FAMILY HISTORY:   Family Status  Relation Status  . Mother Deceased at age 8   Breast Cancer  . Father Alive   Diabetes  . Sister Alive   healthy  . Daughter Alive   2, twins  . Son Alive  . Other     ROS: A complete 10 system review of systems was obtained and was unremarkable apart from what is mentioned above.  PHYSICAL EXAMINATION:    VITALS:  Vitals:   03/13/17 1458  BP: 124/60  Pulse: 78  Weight: 169 lb (76.7 kg)  Height: 6\' 2"  (1.88 m)   Wt Readings from Last 3 Encounters:  03/13/17 169 lb (76.7 kg)  02/17/17 172 lb (78 kg)  01/24/17 167 lb (75.8 kg)    GEN:  The patient appears stated age and is in NAD. HEENT:  Normocephalic.  Fiducials in place.  Bandaids over fiducial areas.  Neurological examination:  Orientation: The patient is alert and oriented x3.  Cranial nerves: There is good facial symmetry. The speech is fluent and clear. Soft palate rises symmetrically and there is no tongue deviation. Hearing is intact to conversational tone. Sensation: Sensation is intact to light touch throughout.  Movement Examination: Tone: Prior to activating DBS, therapy is Moderate rigidity in both arms, right greater than left and in the left leg Abnormal movements: Prior to activating his DBS, there was moderate tremor in both legs. Coordination:  There is near normal rapid alternating movements. Gait and Station: The patient has no difficulty arising out of a deep-seated chair without the use of the hands.  He has no start hesitation and doesn't turn en  bloc. He walks very well  He has a negative pull test.  DBS activation was performed today and is described in more detail in a separate programming procedure note.  In brief, activation of the left STN was very easy and the patient left without rigidity or dyskinesia.  Activation of the right STN generated some dyskinesia of the left shoulder, but the programming was modified and when the patient left, there was minimal dyskinesia of the left arm.  He had no rigidity in the arm or leg when he left.  He did have some mild foot tremor in both feet when he left.  This was intermittent.  ASSESSMENT/PLAN:  1. idiopathic Parkinson's disease.  The patient has tremor, bradykinesia, rigidity and mild postural instability.  -The patient underwent bilateral STN DBS with [facility] device on 02/10/2017 and had IPG placement on 02/17/2017.  -Decrease [name] 4 mg from 2 tablets twice per day to one tablet twice per day.  -Decreased [name] 195 mg from 3 tablets 4 times per day to 2 tablets 2-3 times per day.  He will modify this based on rigidity and dyskinesia.  -Patient shown how to use the patient programmer today. 2.  REM behavior disorder.  -He has clonazepam but no longer using 4.  Much greater than 50% of this visit was spent in counseling and coordinating care.  Total face to face time:  20 min.  this did not include the extensive time spent in programming his DBS which is described in his procedural note.

## 2017-03-13 ENCOUNTER — Encounter: Payer: Self-pay | Admitting: Neurology

## 2017-03-13 ENCOUNTER — Ambulatory Visit (INDEPENDENT_AMBULATORY_CARE_PROVIDER_SITE_OTHER): Payer: Medicare Other | Admitting: Neurology

## 2017-03-13 ENCOUNTER — Ambulatory Visit: Payer: Medicare Other | Admitting: Neurology

## 2017-03-13 VITALS — BP 124/60 | HR 78 | Ht 74.0 in | Wt 169.0 lb

## 2017-03-13 DIAGNOSIS — G4752 REM sleep behavior disorder: Secondary | ICD-10-CM

## 2017-03-13 DIAGNOSIS — G20 Parkinson's disease: Secondary | ICD-10-CM | POA: Diagnosis not present

## 2017-03-13 DIAGNOSIS — Z9689 Presence of other specified functional implants: Secondary | ICD-10-CM

## 2017-03-13 NOTE — Procedures (Signed)
DBS Programming was performed.    Total time spent programming was 120 minutes.  Device was turned on as this was a new activation of a [employer] device.  Soft start was turned on at 10.  Impedences were checked and were within normal limits.  Battery was checked and was determined to be functioning normally and not near the end of life.  Final settings were as follows:  Left brain electrode:     3-C+           ; Amplitude  3.0   mamps   ; Pulse width 60 microseconds;   Frequency   130   Hz.  Right brain electrode:     10-30-09+ (with 10% at 12- and 90% at 11-)   ; Amplitude   0.8  mamps ;  Pulse width 60  microseconds;  Frequency   130    Hz.  (10-C+ with L shoulder dyskinesia at 0.387mAmp) (11-C+ with same at 0.5 mAmp) (11-10+ with same at 0.718mAmp)

## 2017-03-16 NOTE — Progress Notes (Signed)
Ian [name] was seen today in the movement disorders clinic for neurologic consultation at the request of his PCP, Dr. Drue [name].  He has previously seen  Ian L, [place] and saw Dr. Edwin [name] in [name].   This patient is accompanied in the office by his spouse who supplements the history.The consultation is for the evaluation of PD.  I did review Ian [name]'s notes.  Dr. Hurley [name]'s notes are not available to me.    Hist first sx was tremor was R arm tremor.  It was in 2011 according to the patient today, but Dr. Faye [name]'s notes indicate that perhaps this was in 2009.  He was dx with enhanced physiologic tremor.  He was started on propranolol with some help, but made him sluggish.  He went to see Dr. Edwin [name] in 2013 for an opinion in [name].  Dr. 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  He was placed on baclofen and requip.  He thinks that the requip has helped; he is able to turn over in the bed better and move better.  The pt first saw Dr. Rubin [name] on 09/10/13.  Dr. Rubin [name] agreed with the Requip and start the patient on [name] 25/100 CR, but the patient is currently only on its once per day.  12/04/13 update:  The pt presents to the clinic today for his PD.  He is exercising faithfully.  He attends the PD exercise class through the rehab center. The pt was changed to the IR formulation of [name] 25/100 last visit and is on it tid.  He is not sure it helps.  Afternoons are better than mornings.  Ian [name] 25/100 is taken at 8am/3:30 pm and bedtime. He is also on requip xl 6 mg daily.  Continues to have significant tremor.  01/27/14 update:  The patient presents today with his wife, who supplements the history.  The pt has a hx of PD and last visit, we decided to increase his [name] to 2 po tid.  the patient states that he just felt lightheaded on this medication dosage  and up going back to [name] 25/100 CR 3 times per day and then takes [name] 25/100 IR, half tablet twice per day. He remains on requip xl 6 mg daily.  Overall, he does feel fairly good.  He still has tremor.  He went to see Dr. Rubin [name] since our last visit and again talked about DBS therapy.  Dr. Rubin [name] mentioned to him that he looks much better and he had previously and had [name] score had declined.  Some yelling out in the sleep but this has not been a huge issue.  No sleepwalking/talking.  No falls.  He is exercising faithfully.  05/28/14 update:  The patient is presenting today for followup.  Last visit, I increased his [name] from 6 mg daily to 4 mg twice a day.  He is doing well with this.  Tremor is about the same.  He is on a strange combination of carbidopa levodopa, but when I tried to switch him off the IR version, the patient just felt lightheaded and ended up going back to a combination of IR and CR.  He is currently on 25/100 CR 3 times per day and then takes [name] 25/100 IR, half tablet twice per day and sometimes three times per day.  He does best in the evening and worst in the AM.   No swallowing troubles.  Has  dropped weight but thinks that it is exercise and was trying to follow gluten free diet. He is walking, doing a spinning class for PD, and has started golfing again, which he hasn't done in years.   Still considering DBS therapy.    08/27/14 update:  Patient presents today for followup.  He is currently on [name], 4mg  twice a day.  He is on a strange combination of carbidopa levodopa, but hasn't been able to tolerate the IR version well.[name].  He is currently on 25/100 CR 2-3 times per day and then 50/200 at night.  He rarely takes the IR now; only if playing golf or if he needs a boost during the day.  He is finding that he is having more tremor.  He is also having dreams at night that are making his sleep feel unrestful.  He is strongly  considering deep brain stimulation.  He asks me about details regarding this today.  He has asks me about potentially getting a refill on his Ativan.  He apparently got this filled from an outside physician about a year and a half ago and only had 10 pills in total and is just now running out.  He takes one pill every 3 or 4 months if he has to go to a function with his wife.  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  He asks me if he can have a refill today.  09/10/14 update:  The patient is seen today unexpectedly.  Last visit, I started [name] 145 mg, 2 tablets 3 times per day.  He is also on [name] 4 mg twice a day.  He called me initially to tell me that the [name] was working great and he felt much better on the medication.  In fact, he states that his PT and OT reported that he looked much better and his eval scores were markedly improved and they asked him what he had done differently.  He states last week was just a great week.  On Saturday, he was just a little lightheaded and his wife checked his orthostatics and he states that they were negative.  This week, however, he began to report some abnormal movements.  The movements were primarily of the left shoulder and in the abdomen. I wanted to see him on this medication before added or changed anything, as I have never seen him with dyskinesia.  Unfortunately, this morning he only took one of his tablets, but he did take 2 tablets at lunch time about 1 hour before coming.  He has not noticed the movements today.  He has noticed much less tremor overall since starting [name].  11/25/14 update:  The patient returns today for follow-up.  He is on [name] 4 mg twice a day and [name] 145 mg, 2 tablets 3 times per day.  His biggest issue is tremor, but states that its under good control today (but he just exercised).  He has developed tendinitis in the elbow.  He has clonazepam for REM behavior  disorder but admits he doesn't use it.  The patient remains very active in our Parkinson's exercise classes.  No falls.  Some days he does have to "fight" the posture and the "shuffling."  He didn't ever get to see Dr. Venetia MaxonStern as he states that his life got hectic moving his mother in law.    02/20/15 update:  The patient returns today, accompanied by his wife  who supplements the history.  Last visit, I increased his [name] to 195 mg, and he is taking 2 tablets 3 times a day.  He is also on [name] 4 mg twice a day.  He reports that he has not been doing as well because stress levels have been increased.  His mother-in-law, who is living with them, died unexpectedly.  Because of that, he has had more trouble sleeping as well.  He is still faithfully attending exercise classes and is involved in the Parkinson's community.  He has noticed more rigidity and tremor.  He is taking melatonin, 5 Mill grams at night to help him sleep but does not want to use clonazepam, even though he has at home.  05/24/25 update:  The patient returns today for follow-up.  His wife accompanies him and supplements the history.  He is on [name] 4 mg twice a day and [name] 195 mg, 2 tablets 3 times per day.  He has not wanted to increase the dose, although he likely could use more levodopa. States that he is more tremulous right now and attributes that to the fact that he has had a GI illness recently, which made sx's worse.  He has had emesis (one episode).  No diarrhea.  No fevers.    He has not had any falls.  He denies any hallucinations.  No confusion.  No lightheadedness or near syncope.  He continues to exercise faithfully.  06/12/15 update:  The patient is following up today, accompanied by his wife who supplements the history.  When I saw him last on 05/22/15, he was obviously underdosed and I expressed the need for more levodopa but the pt didn't want to increase it as he felt that his viral gastroenteritis was just causing his  worsening sx's.  He didn't tell me then that he had been off of his meds for 3 days and that is the reason he didn't look well.  They called me on 06/10/2015 however to state that he was doing much worse.  They wanted me to work him in that day, but I was not able to.  I didn't realize that he had been off all his PD meds since June 1 and had started receiving care from a natureopath in [place], Dr. Beckie BusingUrban.  He was given information on Dr. Beckie BusingUrban by his dentist Dr. Mellody DanceKeith, who told him he should try amino acid therapy.  He researched and started seeing Dr. Beckie BusingUrban and was told he needed to be off all his PD meds.  He started on D5-mucuna which markets itself as a "safe, natural" levodopa therapy.  He was told that he needed 4-6 months to see a response, but admits that he was told to tell his doctor what he was doing.  He was increasing the dose of this supplement based on urine dopamine levels which they were told were low.  They apparently were also measuring urine serotonin levels.  Not surprisingly, the patient decompensated.  He states that initially he didn't "feel that bad" but the "tremor was awful."  He also states that he had terrible vivid dreams and ultimately became exhausted from not sleeping, shaking and increased rigidity. It got so bad that he ended up in the ER 2 days ago.    He is back on rytary 195, 2 po tid.  He is a little quesy since being back on it.  He has only been on it for 2 days.  He states that he  talked with the natureopath and she told him to just "start back on it."  He is off of the D5-mucuna.  He and his wife bring in several articles for me to read today.    09/29/15 update:  The patient is following up today, accompanied by his wife who supplements the history.  He is on [name] 4 mg twice a day.  He reports that he is now taking  [name] 195 mg, 2 tablets 3 times a day along with [name] 145 mg, one tablet 3 times a day.  He spreads it out so that he takes it at 8 AM/3 PM/11 PM.   He does not necessarily halfway he describes as "traditional wearing off" but states that at the end of the dose he just does not feel good.  Overall, the patient states that he has been doing okay but he also feels that things are more unpredictable.  He feels more tired and thinks that he has mild dyskinesia.  When he eats, he is biting his lip in the same place.   He denies any lightheadedness or near syncope.  He denies falls.  When asked about depression, he states that he thinks "depression is a strong word" but admits that he gets frustrated and overwhelmed about the disease but uses exercise as a release.  He has sent me paperwork since last visit for his disability which has been completed.  He asks me about writing another letter regarding his VA disability as well.  12/30/15 update:  The patient is following up today.  Last visit, I increased his [name] to 6 mg twice a day.  His [name] has been slightly increased, so that he is on 195 mg, 3 tablets 3 times per day and he added an additional 1 tablet at night because he was awakening at 4 AM with tremor. He has changed that again, but taking the same overall dose of rytary, but is now taking 195, 3 in the AM, 2 in the afternoon, 3 in the evening and then takes 2 at bedtime.   Overall, he thinks he feels that it is a little inconsistent; sometimes he does well and sometimes he doesn't.  He continues to faithfully exercise.  No falls.  No hallucinations.  No syncope but has had some lightheadedness; he has had a uri and isn't sure if it is related to that/meds for that.  Mood has been good.  Sleep has been better since he has been back on his medications.  Appetite has been good.  No new medical issues.  He is still going to integrative therapies and thinks that it is helpful.  However, he did some therapy called alpha stim and was told that it changed the alpha waves in the brain and he didn't think that it was helpful and thinks that it made him feel  strange.  Feels that he has minor dyskinesia, especially of the arm when he walks or bends over.  Asks about DBS therapy and thinks he is going to consider that more this year.    03/03/16 update:  The patient is following up today.  He is on [name], 6 mg twice a day and [name] 195 mg on the following schedule: 3/3/3/2.  This is a little higher than last visit (prior 3/2/3/2).  He denies any falls since last visit.  He denies lightheadedness or near syncope.  No hallucinations.  Tremor has been pretty good, but it will fluctuate throughout the day (  states that he did 2 workouts today).  He does occasionally feel like he has some dyskinesia.  He saw Dr. Terrilee FilesZach Smith on February 6 and March 13 and I reviewed those records.  OMT was performed.  The patient feels that that was helpful for his musculoskeletal complaints.  He is doing lots of exercises.  Mood is stable.  Asks about meeting with Dr. Venetia MaxonStern for DBS but wife not ready to pursue.    06/24/16 update:  The patient is following up today, accompanied by his wife who supplements the history.    He is here for her levodopa challenge test.  He saw Dr. Venetia MaxonStern in May and is interested in DBS therapy.  Last visit, I increased his [name] so that he is taking 4 mg, 2 tablets twice a day.  He remains on [name] 195 mg on the following schedule: 3/3/3/2.  He takes clonazepam as needed for REM behavior disorder.  He has not had falls since last visit.  He has seen Dr. Terrilee FilesZach Smith several times and I reviewed those records.  He has not had any lightheadedness or near syncope.  He continues to exercise faithfully.  10/18/16 update:  Pt f/u today.  Wife doesn't accompany today.  He saw Dr. Alinda DoomsBailar since our last visit and no evidence of cognitive deficits.  Pt on [name] 4 mg - 2 po bid and rytary 195 mg: and he has changed that from 3/3/3/2 to 4/3/3/3.  He has a little more off time with more tremor and slowness.  In late summer, he was putting a putting green into his  back yard and states that he tore an intercostal muscle so he couldn't exercise for 8-9 weeks because of pain with breathing.  Started back to working out 3-4 weeks ago.   01/14/16 update:  Patient follows up today, as he has some questions regarding DBS therapy.  Since our last visit, the [facility] [employer] device for deep brain stimulation has been FDA approved.  The patient and I discussed the device over the telephone, and he subsequently read about the device and thought that he would prefer to have this particular device and presents today to talk about it in more detail.  He remains on [name], 4 mg, and takes 2 tablets twice per day.  He also takes [name] 195 mg: 4/3/3/3.  Last visit, I gave him some [name] 25/100 to use as needed and he states that he tried that but it didn't work well for him and so he isn't using that much.  01/31/17 update:  Patient follows up today for preop DBS recording.  Wife present and supplements history.  He had fiducials placed today.  He was to hold his meds this AM but didn't and feels like he is "on."    03/13/17 update:  Patient follows up today.  Patient is accompanied by his twin sister and wife who supplements the history.  The patient underwent bilateral STN DBS with [facility] [employer] device on 02/10/2017 and had IPG placement on 02/17/2017.  The patient has recovered nicely postoperatively.  He had some increase dyskinesia and emailed me about that.  I did tell him he could slightly decrease his [name] if he would like.  He states that he has been taking [name] 195, 3 po tid and [name], 4 mg, 2 tablets twice per day.  He held his Requip today and last took [name], 3 of them at 8 am.  He has had no  falls.  No lightheadedness or near syncope.  03/20/17 update:  Patient is seen today back in follow-up.  He is accompanied by his wife who supplements the history.  His device was activated one week ago, but we did see him today after activation as he was  having dyskinesia on the right side of the body.  The patient relates that the dyskinesia seem to be related to taking [name] 195 mg, 2 tablets and 1 [name] 4 mg.  He ended up not taking the medication and the dyskinesia when away.  However, when he was seen back to adjust the programming, he was more rigid, but was reluctant for me to change anything because he no longer had the dyskinesia.  We went up slightly on the left hemibody (right brain) and gave him some control over the left brain to go up and down.  He is still having some tremor in his feet.  He states that he is only taking med prn.  He took 1 Requip on Friday, none on Saturday, 1 requip and 1 rytary yesterday.  He feels that he did well yesterday with tremor and slight dyskinesia.  Wife states that she notices that patient has been "melancholy."  Part of this is because of the frustration with balance of tremor and dyskinesia and part of it is because he has not been back to exercising lately.   PREVIOUS MEDICATIONS: Requip and neupro (tried few days only); propranolol helped tremor some  ALLERGIES:   Allergies  Allergen Reactions  . No Known Allergies     CURRENT MEDICATIONS:   Allergies as of 03/20/2017      Reactions   No Known Allergies       Medication List       Accurate as of 03/20/17  2:52 PM. Always use your most recent med list.          LECITHIN PO Take 1 capsule by mouth daily.   MULTI-VITAMINS Tabs Take 1 tablet by mouth 2 (two) times daily.   niacin 500 MG tablet Take 500 mg by mouth 3 (three) times daily with meals.   rOPINIRole 4 MG 24 hr tablet Commonly known as:  [name] Take 2 tablets (8 mg total) by mouth 2 (two) times daily.   [name] 48.75-195 MG Cpcr Generic drug:  Carbidopa-Levodopa ER take 4 capsules by mouth every morning and 3 capsules three times a day   vitamin C 1000 MG tablet Take 5,000 mg by mouth daily.   Vitamin D-3 5000 units Tabs Take 5,000 Units by mouth daily.    vitamin E 400 UNIT capsule Take 400 Units by mouth daily.        PAST MEDICAL HISTORY:   Past Medical History:  Diagnosis Date  . History of kidney stones    passed  . PD (Parkinson's disease) (HCC) 09/10/2013  . PONV (postoperative nausea and vomiting)    nausea     PAST SURGICAL HISTORY:   Past Surgical History:  Procedure Laterality Date  . COLONOSCOPY    . MINOR PLACEMENT OF FIDUCIAL N/A 01/31/2017   Procedure: MINOR PLACEMENT OF FIDUCIAL;  Surgeon: Maeola Harman, MD;  Location: [facility] OR;  Service: Neurosurgery;  Laterality: N/A;  Fiducial placement  . none    . PULSE GENERATOR IMPLANT Right 02/17/2017   Procedure: Right implantable pulse generator placement;  Surgeon: Maeola Harman, MD;  Location: [facility] OR;  Service: Neurosurgery;  Laterality: Right;  Bilateral implantable pulse generator placement  . SUBTHALAMIC  STIMULATOR INSERTION Bilateral 02/10/2017   Procedure: Bilateral Deep brain stimulator placement;  Surgeon: Maeola Harman, MD;  Location: [facility] OR;  Service: Neurosurgery;  Laterality: Bilateral;  Bilateral Deep brain stimulator placement  . UPPER GASTROINTESTINAL ENDOSCOPY      SOCIAL HISTORY:   Social History   Social History  . Marital status: Married    Spouse name: N/A  . Number of children: N/A  . Years of education: N/A   Occupational History  . Not on file.   Social History Main Topics  . Smoking status: Never Smoker  . Smokeless tobacco: Never Used  . Alcohol use Yes     Comment: Occ  . Drug use: No  . Sexual activity: Not on file   Other Topics Concern  . Not on file   Social History Narrative  . No narrative on file    FAMILY HISTORY:   Family Status  Relation Status  . Mother Deceased at age 80   Breast Cancer  . Father Alive   Diabetes  . Sister Alive   healthy  . Daughter Alive   2, twins  . Son Alive  . Other     ROS: A complete 10 system review of systems was obtained and was unremarkable apart from what is mentioned  above.  PHYSICAL EXAMINATION:    VITALS:   Vitals:   03/20/17 1429  BP: 124/64  Pulse: 87  SpO2: 97%  Weight: 167 lb (75.8 kg)  Height: 6\' 2"  (1.88 m)   Wt Readings from Last 3 Encounters:  03/20/17 167 lb (75.8 kg)  03/13/17 169 lb (76.7 kg)  02/17/17 172 lb (78 kg)    GEN:  The patient appears stated age and is in NAD. HEENT:  Normocephalic.  Fiducials in place.  Bandaids over fiducial areas.  Neurological examination:  Orientation: The patient is alert and oriented x3.  Cranial nerves: There is good facial symmetry. The speech is fluent and clear. Soft palate rises symmetrically and there is no tongue deviation. Hearing is intact to conversational tone. Sensation: Sensation is intact to light touch throughout.  Movement Examination: Tone: Prior to DBS programming, the patient had moderate to severe rigidity on the left, both upper and lower extremities, but normal tone on the right. Abnormal movements: There is mild to moderate tremor in both lower extremities.  There was no dyskinesia. Coordination:  There is near normal rapid alternating movements. Gait and Station: The patient has no difficulty arising out of a deep-seated chair without the use of the hands.  He has no start hesitation and doesn't turn en bloc. He walks very well but there is decreased arm swing on the left more than right.  He has a negative pull test.  DBS activation was performed today and is described in more detail in a separate programming procedure note.  In brief, there was markedly improved arm swing on the left following programming.  ASSESSMENT/PLAN:  1. idiopathic Parkinson's disease.  The patient has tremor, bradykinesia, rigidity and mild postural instability.  -The patient underwent bilateral STN DBS with [facility] device on 02/10/2017 and had IPG placement on 02/17/2017.  -Change from [name] to [name] 25/100 and it wants him to start taking that as needed.  -Think  that the rapid drop off in the medication rather than tapering may be what is constricting to his mood.  This is particularly true with Requip.  He will hold it for now, but we may start it back in the near  future after we see how he adjusts to the immediate release levodopa.  If so, we will likely change him from extended release to immediate release.  -Encouraged the patient to start exercising again, particularly in the bicycling class. 2.  REM behavior disorder.  -He has clonazepam but no longer using 4.  Much greater than 50% of this visit was spent in counseling and coordinating care.  Total face to face time:  20 min.  this did not include the programming his DBS which is described in his procedural note.

## 2017-03-20 ENCOUNTER — Ambulatory Visit (INDEPENDENT_AMBULATORY_CARE_PROVIDER_SITE_OTHER): Payer: Medicare Other | Admitting: Neurology

## 2017-03-20 ENCOUNTER — Encounter: Payer: Self-pay | Admitting: Neurology

## 2017-03-20 VITALS — BP 124/64 | HR 87 | Ht 74.0 in | Wt 167.0 lb

## 2017-03-20 DIAGNOSIS — Z9689 Presence of other specified functional implants: Secondary | ICD-10-CM

## 2017-03-20 DIAGNOSIS — G4752 REM sleep behavior disorder: Secondary | ICD-10-CM | POA: Diagnosis not present

## 2017-03-20 DIAGNOSIS — G20 Parkinson's disease: Secondary | ICD-10-CM

## 2017-03-20 NOTE — Procedures (Addendum)
DBS Programming was performed.    Total time spent programming was 30 minutes.  Device was confirmed to be on.    Soft start was turned on at 10.  Impedences were checked and were within normal limits.  Battery was checked and was fully recharged.  Final settings were as follows:  Left brain electrode:     3-C+            ; Amplitude  3.0   mamps (can adjust 2.0-3.5)   ; Pulse width 60 microseconds;   Frequency   130   Hz.  Right brain electrode:     10-30-09+ (with 10% at 12- and 90% at 11-)   ; Amplitude   2.3  mamps (can adjust 1.0-2.7);  Pulse width 60  microseconds;  Frequency   130    Hz.  (10-C+ with L shoulder dyskinesia at 0.79mAmp) (11-C+ with same at 0.5 mAmp) (11-10+ with same at 0.50mAmp)

## 2017-03-21 NOTE — Telephone Encounter (Signed)
Error

## 2017-04-05 ENCOUNTER — Encounter: Payer: Self-pay | Admitting: Neurology

## 2017-04-06 MED ORDER — CARBIDOPA-LEVODOPA 25-100 MG PO TABS: 1.0000 | ORAL_TABLET | Freq: Two times a day (BID) | ORAL | 2 refills | Status: DC

## 2017-04-12 ENCOUNTER — Encounter: Payer: Self-pay | Admitting: Neurology

## 2017-04-13 NOTE — Progress Notes (Signed)
Ian PurserKenneth [name] was seen today in the movement disorders clinic for neurologic consultation at the request of his PCP, [name].  He has previously seen  [name],[name], [name] in [place].   This patient is accompanied in the office by his spouse who supplements the history.The consultation is for the evaluation of PD.  I did review [name]'s notes.  [name]'s notes are not available to me.    Hist first sx was tremor was R arm tremor.  It was in 2011 according to the patient today, but [name]'s notes indicate that perhaps this was in 2009.  He was dx with enhanced physiologic tremor.  He was started on propranolol with some help, but made him sluggish.  He went to see [name] in 2013 for an opinion in [name].  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  He was placed on baclofen and requip.  He thinks that the requip has helped; he is able to turn over in the bed better and move better.  The pt first saw [name] on 09/10/13.  [name] agreed with the Requip and start the patient on [name], but the patient is currently only on its once per day.  12/04/13 update:  The pt presents to the clinic today for his PD.  He is exercising faithfully.  He attends the PD exercise class through the rehab center. The pt was changed to the IR formulation of carbidopa/levodopa 25/100 last visit and is on it tid.  He is not sure it helps.  Afternoons are better than mornings.  carbidopa/levodopa 25/100 is taken at 8am/3:30 pm and bedtime. He is also on requip xl 6 mg daily.  Continues to have significant tremor.  01/27/14 update:  The patient presents today with his wife, who supplements the history.  The pt has a hx of PD and last visit, we decided to increase his carbidopa/levodopa to 2 po tid.  the patient states that he just felt lightheaded on this medication dosage  and up going back to [name] 3 times per day and then takes [name], half tablet twice per day. He remains on requip xl 6 mg daily.  Overall, he does feel fairly good.  He still has tremor.  He went to see [name] since our last visit and again talked about DBS therapy.  [name] mentioned to him that he looks much better and he had previously and had [name] score had declined.  Some yelling out in the sleep but this has not been a huge issue.  No sleepwalking/talking.  No falls.  He is exercising faithfully.  05/28/14 update:  The patient is presenting today for followup.  Last visit, I increased his [name] from 6 mg daily to 4 mg twice a day.  He is doing well with this.  Tremor is about the same.  He is on a strange combination of carbidopa levodopa, but when I tried to switch him off the IR version, the patient just felt lightheaded and ended up going back to a combination of IR and CR.  He is currently on 25/100 CR 3 times per day and then takes [name], half tablet twice per day and sometimes three times per day.  He does best in the evening and worst in the AM.   No swallowing troubles.  Has  dropped weight but thinks that it is exercise and was trying to follow gluten free diet. He is walking, doing a spinning class for PD, and has started golfing again, which he hasn't done in years.   Still considering DBS therapy.    08/27/14 update:  Patient presents today for followup.  He is currently on [name], 4mg  twice a day.  He is on a strange combination of carbidopa levodopa, but hasn't been able to tolerate the IR version well.[name].  He is currently on 25/100 CR 2-3 times per day and then 50/200 at night.  He rarely takes the IR now; only if playing golf or if he needs a boost during the day.  He is finding that he is having more tremor.  He is also having dreams at night that are making his sleep feel unrestful.  He is strongly  considering deep brain stimulation.  He asks me about details regarding this today.  He has asks me about potentially getting a refill on his Ativan.  He apparently got this filled from an outside physician about a year and a half ago and only had 10 pills in total and is just now running out.  He takes one pill every 3 or 4 months if he has to go to a function with his wife.  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  He asks me if he can have a refill today.  09/10/14 update:  The patient is seen today unexpectedly.  Last visit, I started [name] 145 mg, 2 tablets 3 times per day.  He is also on [name] 4 mg twice a day.  He called me initially to tell me that the [name] was working great and he felt much better on the medication.  In fact, he states that his PT and OT reported that he looked much better and his eval scores were markedly improved and they asked him what he had done differently.  He states last week was just a great week.  On Saturday, he was just a little lightheaded and his wife checked his orthostatics and he states that they were negative.  This week, however, he began to report some abnormal movements.  The movements were primarily of the left shoulder and in the abdomen. I wanted to see him on this medication before added or changed anything, as I have never seen him with dyskinesia.  Unfortunately, this morning he only took one of his tablets, but he did take 2 tablets at lunch time about 1 hour before coming.  He has not noticed the movements today.  He has noticed much less tremor overall since starting [name].  11/25/14 update:  The patient returns today for follow-up.  He is on [name] 4 mg twice a day and [name] 145 mg, 2 tablets 3 times per day.  His biggest issue is tremor, but states that its under good control today (but he just exercised).  He has developed tendinitis in the elbow.  He has clonazepam for REM behavior  disorder but admits he doesn't use it.  The patient remains very active in our Parkinson's exercise classes.  No falls.  Some days he does have to "fight" the posture and the "shuffling."  He didn't ever get to see Dr. Venetia MaxonStern as he states that his life got hectic moving his mother in law.    02/20/15 update:  The patient returns today, accompanied by his wife  who supplements the history.  Last visit, I increased his [name] to 195 mg, and he is taking 2 tablets 3 times a day.  He is also on [name] 4 mg twice a day.  He reports that he has not been doing as well because stress levels have been increased.  His mother-in-law, who is living with them, died unexpectedly.  Because of that, he has had more trouble sleeping as well.  He is still faithfully attending exercise classes and is involved in the Parkinson's community.  He has noticed more rigidity and tremor.  He is taking melatonin, 5 Mill grams at night to help him sleep but does not want to use clonazepam, even though he has at home.  05/24/25 update:  The patient returns today for follow-up.  His wife accompanies him and supplements the history.  He is on [name] 4 mg twice a day and [name] 195 mg, 2 tablets 3 times per day.  He has not wanted to increase the dose, although he likely could use more levodopa. States that he is more tremulous right now and attributes that to the fact that he has had a GI illness recently, which made sx's worse.  He has had emesis (one episode).  No diarrhea.  No fevers.    He has not had any falls.  He denies any hallucinations.  No confusion.  No lightheadedness or near syncope.  He continues to exercise faithfully.  06/12/15 update:  The patient is following up today, accompanied by his wife who supplements the history.  When I saw him last on 05/22/15, he was obviously underdosed and I expressed the need for more levodopa but the pt didn't want to increase it as he felt that his viral gastroenteritis was just causing his  worsening sx's.  He didn't tell me then that he had been off of his meds for 3 days and that is the reason he didn't look well.  They called me on 06/10/2015 however to state that he was doing much worse.  They wanted me to work him in that day, but I was not able to.  I didn't realize that he had been off all his PD meds since June 1 and had started receiving care from a natureopath in [place], Dr. Beckie Busing.  He was given information on Dr. Beckie Busing by his dentist Dr. Mellody Dance, who told him he should try amino acid therapy.  He researched and started seeing Dr. Beckie Busing and was told he needed to be off all his PD meds.  He started on D5-mucuna which markets itself as a "safe, natural" levodopa therapy.  He was told that he needed 4-6 months to see a response, but admits that he was told to tell his doctor what he was doing.  He was increasing the dose of this supplement based on urine dopamine levels which they were told were low.  They apparently were also measuring urine serotonin levels.  Not surprisingly, the patient decompensated.  He states that initially he didn't "feel that bad" but the "tremor was awful."  He also states that he had terrible vivid dreams and ultimately became exhausted from not sleeping, shaking and increased rigidity. It got so bad that he ended up in the ER 2 days ago.    He is back on rytary 195, 2 po tid.  He is a little quesy since being back on it.  He has only been on it for 2 days.  He states that he  talked with the natureopath and she told him to just "start back on it."  He is off of the D5-mucuna.  He and his wife bring in several articles for me to read today.    09/29/15 update:  The patient is following up today, accompanied by his wife who supplements the history.  He is on [name] 4 mg twice a day.  He reports that he is now taking  [name] 195 mg, 2 tablets 3 times a day along with [name] 145 mg, one tablet 3 times a day.  He spreads it out so that he takes it at 8 AM/3 PM/11 PM.   He does not necessarily halfway he describes as "traditional wearing off" but states that at the end of the dose he just does not feel good.  Overall, the patient states that he has been doing okay but he also feels that things are more unpredictable.  He feels more tired and thinks that he has mild dyskinesia.  When he eats, he is biting his lip in the same place.   He denies any lightheadedness or near syncope.  He denies falls.  When asked about depression, he states that he thinks "depression is a strong word" but admits that he gets frustrated and overwhelmed about the disease but uses exercise as a release.  He has sent me paperwork since last visit for his disability which has been completed.  He asks me about writing another letter regarding his VA disability as well.  12/30/15 update:  The patient is following up today.  Last visit, I increased his [name] to 6 mg twice a day.  His [name] has been slightly increased, so that he is on 195 mg, 3 tablets 3 times per day and he added an additional 1 tablet at night because he was awakening at 4 AM with tremor. He has changed that again, but taking the same overall dose of rytary, but is now taking 195, 3 in the AM, 2 in the afternoon, 3 in the evening and then takes 2 at bedtime.   Overall, he thinks he feels that it is a little inconsistent; sometimes he does well and sometimes he doesn't.  He continues to faithfully exercise.  No falls.  No hallucinations.  No syncope but has had some lightheadedness; he has had a uri and isn't sure if it is related to that/meds for that.  Mood has been good.  Sleep has been better since he has been back on his medications.  Appetite has been good.  No new medical issues.  He is still going to integrative therapies and thinks that it is helpful.  However, he did some therapy called alpha stim and was told that it changed the alpha waves in the brain and he didn't think that it was helpful and thinks that it made him feel  strange.  Feels that he has minor dyskinesia, especially of the arm when he walks or bends over.  Asks about DBS therapy and thinks he is going to consider that more this year.    03/03/16 update:  The patient is following up today.  He is on [name], 6 mg twice a day and [name] 195 mg on the following schedule: 3/3/3/2.  This is a little higher than last visit (prior 3/2/3/2).  He denies any falls since last visit.  He denies lightheadedness or near syncope.  No hallucinations.  Tremor has been pretty good, but it will fluctuate throughout the day (  states that he did 2 workouts today).  He does occasionally feel like he has some dyskinesia.  He saw Dr. Terrilee FilesZach Smith on February 6 and March 13 and I reviewed those records.  OMT was performed.  The patient feels that that was helpful for his musculoskeletal complaints.  He is doing lots of exercises.  Mood is stable.  Asks about meeting with Dr. Venetia MaxonStern for DBS but wife not ready to pursue.    06/24/16 update:  The patient is following up today, accompanied by his wife who supplements the history.    He is here for her levodopa challenge test.  He saw Dr. Venetia MaxonStern in May and is interested in DBS therapy.  Last visit, I increased his [name] so that he is taking 4 mg, 2 tablets twice a day.  He remains on [name] 195 mg on the following schedule: 3/3/3/2.  He takes clonazepam as needed for REM behavior disorder.  He has not had falls since last visit.  He has seen Dr. Terrilee FilesZach Smith several times and I reviewed those records.  He has not had any lightheadedness or near syncope.  He continues to exercise faithfully.  10/18/16 update:  Pt f/u today.  Wife doesn't accompany today.  He saw Dr. Alinda DoomsBailar since our last visit and no evidence of cognitive deficits.  Pt on [name] 4 mg - 2 po bid and rytary 195 mg: and he has changed that from 3/3/3/2 to 4/3/3/3.  He has a little more off time with more tremor and slowness.  In late summer, he was putting a putting green into his  back yard and states that he tore an intercostal muscle so he couldn't exercise for 8-9 weeks because of pain with breathing.  Started back to working out 3-4 weeks ago.   01/14/16 update:  Patient follows up today, as he has some questions regarding DBS therapy.  Since our last visit, the [facility] device for deep brain stimulation has been FDA approved.  The patient and I discussed the device over the telephone, and he subsequently read about the device and thought that he would prefer to have this particular device and presents today to talk about it in more detail.  He remains on [name], 4 mg, and takes 2 tablets twice per day.  He also takes [name] 195 mg: 4/3/3/3.  Last visit, I gave him some carbidopa/levodopa 25/100 to use as needed and he states that he tried that but it didn't work well for him and so he isn't using that much.  01/31/17 update:  Patient follows up today for preop DBS recording.  Wife present and supplements history.  He had fiducials placed today.  He was to hold his meds this AM but didn't and feels like he is "on."    03/13/17 update:  Patient follows up today.  Patient is accompanied by his twin sister and wife who supplements the history.  The patient underwent bilateral STN DBS with [facility] device on 02/10/2017 and had IPG placement on 02/17/2017.  The patient has recovered nicely postoperatively.  He had some increase dyskinesia and emailed me about that.  I did tell him he could slightly decrease his [name] if he would like.  He states that he has been taking [name] 195, 3 po tid and [name], 4 mg, 2 tablets twice per day.  He held his Requip today and last took [name], 3 of them at 8 am.  He has had no  falls.  No lightheadedness or near syncope.  03/20/17 update:  Patient is seen today back in follow-up.  He is accompanied by his wife who supplements the history.  His device was activated one week ago, but we did see him today after activation as he was  having dyskinesia on the right side of the body.  The patient relates that the dyskinesia seem to be related to taking [name] 195 mg, 2 tablets and 1 [name] 4 mg.  He ended up not taking the medication and the dyskinesia when away.  However, when he was seen back to adjust the programming, he was more rigid, but was reluctant for me to change anything because he no longer had the dyskinesia.  We went up slightly on the left hemibody (right brain) and gave him some control over the left brain to go up and down.  He is still having some tremor in his feet.  He states that he is only taking med prn.  He took 1 Requip on Friday, none on Saturday, 1 requip and 1 rytary yesterday.  He feels that he did well yesterday with tremor and slight dyskinesia.  Wife states that she notices that patient has been "melancholy."  Part of this is because of the frustration with balance of tremor and dyskinesia and part of it is because he has not been back to exercising lately.  04/14/17 update:  Patient is seen back today in follow-up.  He has adjusted his DBS some on his own.  He states that he has had some difficulty in balancing symptoms between the left and right side of the body.  He has also been having some challenges with mood.  He was taking carbidopa/levodopa 25/100, 2-3 times per day, but this past week he ended up switching to [name] 195 mg and has been taking that 3 times per day.  He does think that that has helped mood somewhat.  He is back to exercising.  He has not fallen.   PREVIOUS MEDICATIONS: Requip and neupro (tried few days only); propranolol helped tremor some  ALLERGIES:   Allergies  Allergen Reactions  . No Known Allergies     CURRENT MEDICATIONS:   Allergies as of 04/14/2017      Reactions   No Known Allergies       Medication List       Accurate as of 04/14/17  2:07 PM. Always use your most recent med list.          LECITHIN PO Take 1 capsule by mouth daily.   MULTI-VITAMINS  Tabs Take 1 tablet by mouth 2 (two) times daily.   niacin 500 MG tablet Take 500 mg by mouth 3 (three) times daily with meals.   [name] 48.75-195 MG Cpcr Generic drug:  Carbidopa-Levodopa ER take 4 capsules by mouth every morning and 3 capsules three times a day   vitamin C 1000 MG tablet Take 5,000 mg by mouth daily.   Vitamin D-3 5000 units Tabs Take 5,000 Units by mouth daily.   vitamin E 400 UNIT capsule Take 400 Units by mouth daily.        PAST MEDICAL HISTORY:   Past Medical History:  Diagnosis Date  . History of kidney stones    passed  . PD (Parkinson's disease) (HCC) 09/10/2013  . PONV (postoperative nausea and vomiting)    nausea     PAST SURGICAL HISTORY:   Past Surgical History:  Procedure Laterality Date  . COLONOSCOPY    .  MINOR PLACEMENT OF FIDUCIAL N/A 01/31/2017   Procedure: MINOR PLACEMENT OF FIDUCIAL;  Surgeon: Maeola Harman, MD;  Location[facility];  Service: Neurosurgery;  Laterality: N/A;  Fiducial placement  . none    . PULSE GENERATOR IMPLANT Right 02/17/2017   Procedure: Right implantable pulse generator placement;  Surgeon: Maeola Harman, MD;  Location: [facility] OR;  Service: Neurosurgery;  Laterality: Right;  Bilateral implantable pulse generator placement  . SUBTHALAMIC STIMULATOR INSERTION Bilateral 02/10/2017   Procedure: Bilateral Deep brain stimulator placement;  Surgeon: Maeola Harman, MD;  Location: [facility] OR;  Service: Neurosurgery;  Laterality: Bilateral;  Bilateral Deep brain stimulator placement  . UPPER GASTROINTESTINAL ENDOSCOPY      SOCIAL HISTORY:   Social History   Social History  . Marital status: Married    Spouse name: N/A  . Number of children: N/A  . Years of education: N/A   Occupational History  . Not on file.   Social History Main Topics  . Smoking status: Never Smoker  . Smokeless tobacco: Never Used  . Alcohol use Yes     Comment: Occ  . Drug use: No  . Sexual activity: Not on file   Other Topics Concern  . Not on  file   Social History Narrative  . No narrative on file    FAMILY HISTORY:   Family Status  Relation Status  . Mother Deceased at age 22   Breast Cancer  . Father Alive   Diabetes  . Sister Alive   healthy  . Daughter Alive   2, twins  . Son Alive  . Other     ROS: A complete 10 system review of systems was obtained and was unremarkable apart from what is mentioned above.  PHYSICAL EXAMINATION:    VITALS:   Vitals:   04/14/17 1334  BP: 130/60  Pulse: 78  SpO2: 96%  Weight: 168 lb (76.2 kg)  Height:  (1.88 m)   Wt Readings from Last 3 Encounters:  04/14/17 168 lb (76.2 kg)  03/20/17 167 lb (75.8 kg)  03/13/17 169 lb (76.7 kg)    GEN:  The patient appears stated age and is in NAD. HEENT:  Normocephalic.  Fiducials in place.  Bandaids over fiducial areas.  Neurological examination:  Orientation: The patient is alert and oriented x3.  Cranial nerves: There is good facial symmetry. The speech is fluent and clear. Soft palate rises symmetrically and there is no tongue deviation. Hearing is intact to conversational tone. Sensation: Sensation is intact to light touch throughout.  Movement Examination: Tone: Prior to DBS programming, the patient had mild rigidity in the UE's bilaterally Abnormal movements: There is mild to moderate tremor in both lower extremities.  There was no dyskinesia. Coordination:  There is near normal rapid alternating movements. Gait and Station: The patient has no difficulty arising out of a deep-seated chair without the use of the hands.  He has no start hesitation and doesn't turn en bloc. He walks very well but there is decreased arm swing on the left more than right (improved post DBS)  He has a negative pull test.  DBS activation was performed today and is described in more detail in a separate programming procedure note.  In brief, there was improved arm swing and improvement in rigidity.  ASSESSMENT/PLAN:  1. idiopathic  Parkinson's disease.  The patient has tremor, bradykinesia, rigidity and mild postural instability.  -The patient underwent bilateral STN DBS with [facility] device on 02/10/2017 and had IPG  placement on 02/17/2017.  -Continue rytary 195 mg tid.  Pt had not taken that today so given the ability to turn down the DBS if needed.  -Mood markedly better now that back on medication and back to exercise. 2.  REM behavior disorder.  -He has clonazepam but no longer using 3.  Much greater than 50% of this visit was spent in counseling and coordinating care.  Total face to face time:  20 min.  this did not include the programming his DBS which is described in his procedural note.

## 2017-04-14 ENCOUNTER — Encounter: Payer: Self-pay | Admitting: Neurology

## 2017-04-14 ENCOUNTER — Ambulatory Visit (INDEPENDENT_AMBULATORY_CARE_PROVIDER_SITE_OTHER): Payer: Medicare Other | Admitting: Neurology

## 2017-04-14 VITALS — BP 130/60 | HR 78 | Ht 74.0 in | Wt 168.0 lb

## 2017-04-14 DIAGNOSIS — G20 Parkinson's disease: Secondary | ICD-10-CM | POA: Diagnosis not present

## 2017-04-14 NOTE — Procedures (Signed)
DBS Programming was performed.    Total time spent programming was 30 minutes.  Device was confirmed to be on.    Soft start was turned on at 10.  Impedences were checked and were within normal limits.  Battery was checked and was fully recharged.  Final settings were as follows:  Left brain electrode:     3-C+            ; Amplitude  3.2   mamps (can adjust 2.0-3.7)   ; Pulse width 60 microseconds;   Frequency   130   Hz.  Right brain electrode:     10-30-09+ (with 10% at 12- and 90% at 11-)   ; Amplitude   3.1  mamps (can adjust 1.0-3.7);  Pulse width 60  microseconds;  Frequency   130    Hz.  (10-C+ with L shoulder dyskinesia at 0.51mAmp) (11-C+ with same at 0.5 mAmp) (11-10+ with same at 0.43mAmp)

## 2017-04-20 ENCOUNTER — Encounter: Payer: Self-pay | Admitting: Neurology

## 2017-04-21 MED ORDER — ROPINIROLE HCL ER 2 MG PO TB24: 2.0000 mg | ORAL_TABLET | Freq: Every day | ORAL | 1 refills | Status: DC

## 2017-04-21 MED ORDER — CARBIDOPA-LEVODOPA ER 36.25-145 MG PO CPCR: 1.0000 | ORAL_CAPSULE | Freq: Three times a day (TID) | ORAL | 1 refills | Status: DC

## 2017-05-04 ENCOUNTER — Encounter: Payer: Self-pay | Admitting: Neurology

## 2017-05-05 MED ORDER — ROPINIROLE HCL ER 2 MG PO TB24
2.0000 mg | ORAL_TABLET | Freq: Every day | ORAL | 1 refills | Status: DC
Start: 2017-05-05 — End: 2017-05-11

## 2017-05-05 MED ORDER — CARBIDOPA-LEVODOPA ER 36.25-145 MG PO CPCR: 1.0000 | ORAL_CAPSULE | Freq: Every day | ORAL | 1 refills | Status: DC

## 2017-05-05 NOTE — Telephone Encounter (Signed)
Prescriptions sent

## 2017-05-11 ENCOUNTER — Other Ambulatory Visit: Payer: Self-pay | Admitting: Neurology

## 2017-05-11 MED ORDER — ROPINIROLE HCL ER 2 MG PO TB24
2.0000 mg | ORAL_TABLET | Freq: Every day | ORAL | 1 refills | Status: DC
Start: 2017-05-11 — End: 2017-07-06

## 2017-05-11 MED ORDER — CARBIDOPA-LEVODOPA ER 36.25-145 MG PO CPCR: 1.0000 | ORAL_CAPSULE | ORAL | 1 refills | Status: DC

## 2017-05-19 NOTE — Addendum Note (Signed)
Addendum  created 05/19/17 1056 by Tyliah Schlereth D, MD   Sign clinical note    

## 2017-05-19 NOTE — Addendum Note (Signed)
Addendum  created 05/19/17 1200 by Lior Hoen, MD   Sign clinical note    

## 2017-06-14 ENCOUNTER — Encounter: Payer: Self-pay | Admitting: Neurology

## 2017-06-23 ENCOUNTER — Encounter: Payer: Self-pay | Admitting: Neurology

## 2017-06-23 ENCOUNTER — Ambulatory Visit (INDEPENDENT_AMBULATORY_CARE_PROVIDER_SITE_OTHER): Payer: Medicare Other | Admitting: Neurology

## 2017-06-23 VITALS — BP 124/60 | HR 78 | Ht 74.0 in | Wt 168.0 lb

## 2017-06-23 DIAGNOSIS — Z9689 Presence of other specified functional implants: Secondary | ICD-10-CM | POA: Diagnosis not present

## 2017-06-23 DIAGNOSIS — G20 Parkinson's disease: Secondary | ICD-10-CM | POA: Diagnosis not present

## 2017-06-23 DIAGNOSIS — G20A1 Parkinson's disease without dyskinesia, without mention of fluctuations: Secondary | ICD-10-CM

## 2017-06-23 MED ORDER — CARBIDOPA-LEVODOPA ER 36.25-145 MG PO CPCR: 1.0000 | ORAL_CAPSULE | ORAL | 5 refills | Status: DC

## 2017-06-23 NOTE — Procedures (Signed)
DBS Programming was performed.    Total time spent programming was 25 minutes.  Device was confirmed to be on.    Soft start was turned on at 10.  Impedences were checked and were within normal limits.  Battery was checked and was fully recharged.  Final settings were as follows:  Left brain electrode:     3-C+            ; Amplitude  3.5   mamps (can adjust 2.0-3.7)   ; Pulse width 60 microseconds;   Frequency   130   Hz.  Right brain electrode:     10-30-09+ (with 10% at 12- and 90% at 11-)   ; Amplitude   3.3  mamps (can adjust 1.0-3.7);  Pulse width 60  microseconds;  Frequency   130    Hz.  (10-C+ with L shoulder dyskinesia at 0.17mAmp) (11-C+ with same at 0.5 mAmp) (11-10+ with same at 0.138mAmp)

## 2017-06-23 NOTE — Progress Notes (Signed)
Ian PurserKenneth [name] was seen today in the movement disorders clinic for neurologic consultation at the request of his PCP, Dr. Drue [name].  He has previously seen  Ian OraAndy, Ian L, MD and saw Dr. Edwin [name] in [place].   This patient is accompanied in the office by his spouse who supplements the history.The consultation is for the evaluation of PD.  I did review Dr. Carloyn [name]'s notes.  Dr. Hurley [name]'s notes are not available to me.    Hist first sx was tremor was R arm tremor.  It was in 2011 according to the patient today, but Dr. Faye [name]'s notes indicate that perhaps this was in 2009.  He was dx with enhanced physiologic tremor.  He was started on propranolol with some help, but made him sluggish.  He went to see Dr. Edwin [name] in 2013 for an opinion in [place].  Dr. 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  He was placed on baclofen and requip.  He thinks that the requip has helped; he is able to turn over in the bed better and move better.  The pt first saw Dr. Rubin [name] on 09/10/13.  Dr. Rubin [name] agreed with the Requip and start the patient on [name], but the patient is currently only on its once per day.  12/04/13 update:  The pt presents to the clinic today for his PD.  He is exercising faithfully.  He attends the PD exercise class through the rehab center. The pt was changed to the IR formulation of carbidopa/levodopa 25/100 last visit and is on it tid.  He is not sure it helps.  Afternoons are better than mornings.  carbidopa/levodopa 25/100 is taken at 8am/3:30 pm and bedtime. He is also on requip xl 6 mg daily.  Continues to have significant tremor.  01/27/14 update:  The patient presents today with his wife, who supplements the history.  The pt has a hx of PD and last visit, we decided to increase his carbidopa/levodopa to 2 po tid.  the patient states that he just felt lightheaded on this medication  dosage and up going back to [name] 3 times per day and then takes [name], half tablet twice per day. He remains on requip xl 6 mg daily.  Overall, he does feel fairly good.  He still has tremor.  He went to see Dr. Rubin [name] since our last visit and again talked about DBS therapy.  Dr. Rubin [name] mentioned to him that he looks much better and he had previously and had [name] score had declined.  Some yelling out in the sleep but this has not been a huge issue.  No sleepwalking/talking.  No falls.  He is exercising faithfully.  05/28/14 update:  The patient is presenting today for followup.  Last visit, I increased his [name] from 6 mg daily to 4 mg twice a day.  He is doing well with this.  Tremor is about the same.  He is on a strange combination of carbidopa levodopa, but when I tried to switch him off the IR version, the patient just felt lightheaded and ended up going back to a combination of IR and CR.  He is currently on 25/100 CR 3 times per day and then takes [name], half tablet twice per day and sometimes three times per day.  He does best in the evening and worst in the AM.   No swallowing troubles.  Has dropped weight but thinks that it is exercise and was trying to follow gluten free diet. He is walking, doing a spinning class for PD, and has started golfing again, which he hasn't done in years.   Still considering DBS therapy.    08/27/14 update:  Patient presents today for followup.  He is currently on [name], 4mg  twice a day.  He is on a strange combination of carbidopa levodopa, but hasn't been able to tolerate the IR version well.[name]  He is currently on 25/100 CR 2-3 times per day and then 50/200 at night.  He rarely takes the IR now; only if playing golf or if he needs a boost during the day.  He is finding that he is having more tremor.  He is also having dreams at night that are making his sleep feel unrestful.  He is strongly  considering deep brain stimulation.  He asks me about details regarding this today.  He has asks me about potentially getting a refill on his Ativan.  He apparently got this filled from an outside physician about a year and a half ago and only had 10 pills in total and is just now running out.  He takes one pill every 3 or 4 months if he has to go to a function with his wife.  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  He asks me if he can have a refill today.  09/10/14 update:  The patient is seen today unexpectedly.  Last visit, I started [name] 145 mg, 2 tablets 3 times per day.  He is also on [name] 4 mg twice a day.  He called me initially to tell me that the [name] was working great and he felt much better on the medication.  In fact, he states that his PT and OT reported that he looked much better and his eval scores were markedly improved and they asked him what he had done differently.  He states last week was just a great week.  On Saturday, he was just a little lightheaded and his wife checked his orthostatics and he states that they were negative.  This week, however, he began to report some abnormal movements.  The movements were primarily of the left shoulder and in the abdomen. I wanted to see him on this medication before added or changed anything, as I have never seen him with dyskinesia.  Unfortunately, this morning he only took one of his tablets, but he did take 2 tablets at lunch time about 1 hour before coming.  He has not noticed the movements today.  He has noticed much less tremor overall since starting [name].  11/25/14 update:  The patient returns today for follow-up.  He is on [name] 4 mg twice a day and [name] 145 mg, 2 tablets 3 times per day.  His biggest issue is tremor, but states that its under good control today (but he just exercised).  He has developed tendinitis in the elbow.  He has clonazepam for REM behavior  disorder but admits he doesn't use it.  The patient remains very active in our Parkinson's exercise classes.  No falls.  Some days he does have to "fight" the posture and the "shuffling."  He didn't ever get to see Dr. Venetia Maxon as he states that his life got hectic moving his mother in law.    02/20/15 update:  The patient returns today, accompanied by his  wife who supplements the history.  Last visit, I increased his [name] to 195 mg, and he is taking 2 tablets 3 times a day.  He is also on [name] 4 mg twice a day.  He reports that he has not been doing as well because stress levels have been increased.  His mother-in-law, who is living with them, died unexpectedly.  Because of that, he has had more trouble sleeping as well.  He is still faithfully attending exercise classes and is involved in the Parkinson's community.  He has noticed more rigidity and tremor.  He is taking melatonin, 5 Mill grams at night to help him sleep but does not want to use clonazepam, even though he has at home.  05/24/25 update:  The patient returns today for follow-up.  His wife accompanies him and supplements the history.  He is on [name] 4 mg twice a day and [name] 195 mg, 2 tablets 3 times per day.  He has not wanted to increase the dose, although he likely could use more levodopa. States that he is more tremulous right now and attributes that to the fact that he has had a GI illness recently, which made sx's worse.  He has had emesis (one episode).  No diarrhea.  No fevers.    He has not had any falls.  He denies any hallucinations.  No confusion.  No lightheadedness or near syncope.  He continues to exercise faithfully.  06/12/15 update:  The patient is following up today, accompanied by his wife who supplements the history.  When I saw him last on 05/22/15, he was obviously underdosed and I expressed the need for more levodopa but the pt didn't want to increase it as he felt that his viral gastroenteritis was just causing his  worsening sx's.  He didn't tell me then that he had been off of his meds for 3 days and that is the reason he didn't look well.  They called me on 06/10/2015 however to state that he was doing much worse.  They wanted me to work him in that day, but I was not able to.  I didn't realize that he had been off all his PD meds since June 1 and had started receiving care from a natureopath in [place], Dr. Beckie BusingUrban.  He was given information on Dr. Beckie BusingUrban by his dentist Dr. Mellody DanceKeith, who told him he should try amino acid therapy.  He researched and started seeing Dr. Beckie BusingUrban and was told he needed to be off all his PD meds.  He started on D5-mucuna which markets itself as a "safe, natural" levodopa therapy.  He was told that he needed 4-6 months to see a response, but admits that he was told to tell his doctor what he was doing.  He was increasing the dose of this supplement based on urine dopamine levels which they were told were low.  They apparently were also measuring urine serotonin levels.  Not surprisingly, the patient decompensated.  He states that initially he didn't "feel that bad" but the "tremor was awful."  He also states that he had terrible vivid dreams and ultimately became exhausted from not sleeping, shaking and increased rigidity. It got so bad that he ended up in the ER 2 days ago.    He is back on rytary 195, 2 po tid.  He is a little quesy since being back on it.  He has only been on it for 2 days.  He states that  he talked with the natureopath and she told him to just "start back on it."  He is off of the D5-mucuna.  He and his wife bring in several articles for me to read today.    09/29/15 update:  The patient is following up today, accompanied by his wife who supplements the history.  He is on [name] 4 mg twice a day.  He reports that he is now taking  [name] 195 mg, 2 tablets 3 times a day along with [name] 145 mg, one tablet 3 times a day.  He spreads it out so that he takes it at 8 AM/3 PM/11 PM.   He does not necessarily halfway he describes as "traditional wearing off" but states that at the end of the dose he just does not feel good.  Overall, the patient states that he has been doing okay but he also feels that things are more unpredictable.  He feels more tired and thinks that he has mild dyskinesia.  When he eats, he is biting his lip in the same place.   He denies any lightheadedness or near syncope.  He denies falls.  When asked about depression, he states that he thinks "depression is a strong word" but admits that he gets frustrated and overwhelmed about the disease but uses exercise as a release.  He has sent me paperwork since last visit for his disability which has been completed.  He asks me about writing another letter regarding his VA disability as well.  12/30/15 update:  The patient is following up today.  Last visit, I increased his [name] to 6 mg twice a day.  His [name] has been slightly increased, so that he is on 195 mg, 3 tablets 3 times per day and he added an additional 1 tablet at night because he was awakening at 4 AM with tremor. He has changed that again, but taking the same overall dose of rytary, but is now taking 195, 3 in the AM, 2 in the afternoon, 3 in the evening and then takes 2 at bedtime.   Overall, he thinks he feels that it is a little inconsistent; sometimes he does well and sometimes he doesn't.  He continues to faithfully exercise.  No falls.  No hallucinations.  No syncope but has had some lightheadedness; he has had a uri and isn't sure if it is related to that/meds for that.  Mood has been good.  Sleep has been better since he has been back on his medications.  Appetite has been good.  No new medical issues.  He is still going to integrative therapies and thinks that it is helpful.  However, he did some therapy called alpha stim and was told that it changed the alpha waves in the brain and he didn't think that it was helpful and thinks that it made him feel  strange.  Feels that he has minor dyskinesia, especially of the arm when he walks or bends over.  Asks about DBS therapy and thinks he is going to consider that more this year.    03/03/16 update:  The patient is following up today.  He is on [name], 6 mg twice a day and [name] 195 mg on the following schedule: 3/3/3/2.  This is a little higher than last visit (prior 3/2/3/2).  He denies any falls since last visit.  He denies lightheadedness or near syncope.  No hallucinations.  Tremor has been pretty good, but it will fluctuate throughout the  day (states that he did 2 workouts today).  He does occasionally feel like he has some dyskinesia.  He saw Dr. [name] on February 6 and March 13 and I reviewed those records.  OMT was performed.  The patient feels that that was helpful for his musculoskeletal complaints.  He is doing lots of exercises.  Mood is stable.  Asks about meeting with Dr. Venetia Maxon for DBS but wife not ready to pursue.    06/24/16 update:  The patient is following up today, accompanied by his wife who supplements the history.    He is here for her levodopa challenge test.  He saw Dr. Venetia Maxon in May and is interested in DBS therapy.  Last visit, I increased his [name] so that he is taking 4 mg, 2 tablets twice a day.  He remains on [name] 195 mg on the following schedule: 3/3/3/2.  He takes clonazepam as needed for REM behavior disorder.  He has not had falls since last visit.  He has seen Dr. [name] several times and I reviewed those records.  He has not had any lightheadedness or near syncope.  He continues to exercise faithfully.  10/18/16 update:  Pt f/u today.  Wife doesn't accompany today.  He saw Dr. Alinda Dooms since our last visit and no evidence of cognitive deficits.  Pt on [name] 4 mg - 2 po bid and rytary 195 mg: and he has changed that from 3/3/3/2 to 4/3/3/3.  He has a little more off time with more tremor and slowness.  In late summer, he was putting a putting green into his  back yard and states that he tore an intercostal muscle so he couldn't exercise for 8-9 weeks because of pain with breathing.  Started back to working out 3-4 weeks ago.   01/14/16 update:  Patient follows up today, as he has some questions regarding DBS therapy.  Since our last visit, the [facility] device for deep brain stimulation has been FDA approved.  The patient and I discussed the device over the telephone, and he subsequently read about the device and thought that he would prefer to have this particular device and presents today to talk about it in more detail.  He remains on [name], 4 mg, and takes 2 tablets twice per day.  He also takes [name] 195 mg: 4/3/3/3.  Last visit, I gave him some carbidopa/levodopa 25/100 to use as needed and he states that he tried that but it didn't work well for him and so he isn't using that much.  01/31/17 update:  Patient follows up today for preop DBS recording.  Wife present and supplements history.  He had fiducials placed today.  He was to hold his meds this AM but didn't and feels like he is "on."    03/13/17 update:  Patient follows up today.  Patient is accompanied by his twin sister and wife who supplements the history.  The patient underwent bilateral STN DBS with [facility] [employer] device on 02/10/2017 and had IPG placement on 02/17/2017.  The patient has recovered nicely postoperatively.  He had some increase dyskinesia and emailed me about that.  I did tell him he could slightly decrease his [name] if he would like.  He states that he has been taking [name] 195, 3 po tid and [name], 4 mg, 2 tablets twice per day.  He held his Requip today and last took [name], 3 of them at 8 am.  He has had  no falls.  No lightheadedness or near syncope.  03/20/17 update:  Patient is seen today back in follow-up.  He is accompanied by his wife who supplements the history.  His device was activated one week ago, but we did see him today after activation as he was  having dyskinesia on the right side of the body.  The patient relates that the dyskinesia seem to be related to taking [name] 195 mg, 2 tablets and 1 [name] 4 mg.  He ended up not taking the medication and the dyskinesia when away.  However, when he was seen back to adjust the programming, he was more rigid, but was reluctant for me to change anything because he no longer had the dyskinesia.  We went up slightly on the left hemibody (right brain) and gave him some control over the left brain to go up and down.  He is still having some tremor in his feet.  He states that he is only taking med prn.  He took 1 Requip on Friday, none on Saturday, 1 requip and 1 rytary yesterday.  He feels that he did well yesterday with tremor and slight dyskinesia.  Wife states that she notices that patient has been "melancholy."  Part of this is because of the frustration with balance of tremor and dyskinesia and part of it is because he has not been back to exercising lately.  04/14/17 update:  Patient is seen back today in follow-up.  He has adjusted his DBS some on his own.  He states that he has had some difficulty in balancing symptoms between the left and right side of the body.  He has also been having some challenges with mood.  He was taking carbidopa/levodopa 25/100, 2-3 times per day, but this past week he ended up switching to [name] 195 mg and has been taking that 3 times per day.  He does think that that has helped mood somewhat.  He is back to exercising.  He has not fallen.  06/23/17 update:  Patient is seen today in follow-up.  He is currently on [name], 145 mg and takes 2 to 3 [name] at 8:00am, 2 [name] at 1:00pm, 2 [name] At 6:00pm and 1 [name] at bedtime.  He has been off and on the Requip.  He initially thought it made him fatigued, but went back to taking the 2 mg tablets and is on the XL.  Thinks that the XL gives him some dyskinesia.   Nonmotor symptoms such as fatigue and apathy have been the biggest  issues.  He really has not wanted to take any antidepressants.  States that he is not depressed and taking supplement of amino acid triptophan and he feels apathy is better.  He has struggled with voice strength.  He has not had falls.  He is back to exercise.  For the first time in a long time, he was able to walk the entire golf course and play a round of golf without a feeling of wearing off of meds.    PREVIOUS MEDICATIONS: Requip and neupro (tried few days only); propranolol helped tremor some  ALLERGIES:   Allergies  Allergen Reactions  . No Known Allergies     CURRENT MEDICATIONS:   Allergies as of 06/23/2017      Reactions   No Known Allergies       Medication List       Accurate as of 06/23/17  8:53 AM. Always use your most recent med list.  Carbidopa-Levodopa ER 36.25-145 MG Cpcr Commonly known as:  [name] Take 1 tablet by mouth See admin instructions. Patient taking 2 tablet at 8 am, 1at 1 pm, 1 at 6 pm, 1 at 11 pm   LECITHIN PO Take 1 capsule by mouth daily.   MULTI-VITAMINS Tabs Take 1 tablet by mouth 2 (two) times daily.   niacin 500 MG tablet Take 500 mg by mouth 3 (three) times daily with meals.   rOPINIRole 2 MG 24 hr tablet Commonly known as:  [name] Take 1 tablet (2 mg total) by mouth daily.   vitamin C 1000 MG tablet Take 5,000 mg by mouth daily.   Vitamin D-3 5000 units Tabs Take 5,000 Units by mouth daily.   vitamin E 400 UNIT capsule Take 400 Units by mouth daily.        PAST MEDICAL HISTORY:   Past Medical History:  Diagnosis Date  . History of kidney stones    passed  . PD (Parkinson's disease) (HCC) 09/10/2013  . PONV (postoperative nausea and vomiting)    nausea     PAST SURGICAL HISTORY:   Past Surgical History:  Procedure Laterality Date  . COLONOSCOPY    . MINOR PLACEMENT OF FIDUCIAL N/A 01/31/2017   Procedure: MINOR PLACEMENT OF FIDUCIAL;  Surgeon: Maeola Harman, MD;  Location: [facility] OR;  Service: Neurosurgery;   Laterality: N/A;  Fiducial placement  . none    . PULSE GENERATOR IMPLANT Right 02/17/2017   Procedure: Right implantable pulse generator placement;  Surgeon: Maeola Harman, MD;  Location: [facility] OR;  Service: Neurosurgery;  Laterality: Right;  Bilateral implantable pulse generator placement  . SUBTHALAMIC STIMULATOR INSERTION Bilateral 02/10/2017   Procedure: Bilateral Deep brain stimulator placement;  Surgeon: Maeola Harman, MD;  Location: [facility] OR;  Service: Neurosurgery;  Laterality: Bilateral;  Bilateral Deep brain stimulator placement  . UPPER GASTROINTESTINAL ENDOSCOPY      SOCIAL HISTORY:   Social History   Social History  . Marital status: Married    Spouse name: N/A  . Number of children: N/A  . Years of education: N/A   Occupational History  . Not on file.   Social History Main Topics  . Smoking status: Never Smoker  . Smokeless tobacco: Never Used  . Alcohol use Yes     Comment: Occ  . Drug use: No  . Sexual activity: Not on file   Other Topics Concern  . Not on file   Social History Narrative  . No narrative on file    FAMILY HISTORY:   Family Status  Relation Status  . Mother Deceased at age 41       Breast Cancer  . Father Alive       Diabetes  . Sister Alive       healthy  . Daughter Alive       2, twins  . Son Alive  . Other (Not Specified)    ROS: A complete 10 system review of systems was obtained and was unremarkable apart from what is mentioned above.  PHYSICAL EXAMINATION:    VITALS:   Vitals:   06/23/17 0816  BP: 124/60  Pulse: 78  SpO2: 98%  Weight: 168 lb (76.2 kg)  Height: 6\' 2"  (1.88 m)   Wt Readings from Last 3 Encounters:  06/23/17 168 lb (76.2 kg)  04/14/17 168 lb (76.2 kg)  03/20/17 167 lb (75.8 kg)    GEN:  The patient appears stated age and is in NAD. HEENT:  Normocephalic.  Fiducials in place.  Bandaids over fiducial areas.  Neurological examination:  Orientation: The patient is alert and oriented x3.  Cranial nerves:  There is good facial symmetry. The speech is fluent and clear. Soft palate rises symmetrically and there is no tongue deviation. Hearing is intact to conversational tone. Sensation: Sensation is intact to light touch throughout.  Movement Examination: Tone: normal in the LUE and mild rigidity in the LUE Abnormal movements: There is very rare tremor in the [name] foot and mild tremor in the R hand and foot Coordination:  There is near normal rapid alternating movements. Gait and Station: The patient has no difficulty arising out of a deep-seated chair without the use of the hands.  He has no start hesitation and doesn't turn en bloc. He walks very well but there is decreased arm swing on the left more than right (improved post DBS)  He has a negative pull test.  DBS activation was performed today and is described in more detail in a separate programming procedure note.   ASSESSMENT/PLAN:  1. idiopathic Parkinson's disease.  The patient has tremor, bradykinesia, rigidity and mild postural instability.  -The patient underwent bilateral STN DBS with [facility] device on 02/10/2017 and had IPG placement on 02/17/2017.  -Continue [name], 145 mg and takes 2 to 3 [name] at 8:00am, 2 [name] at 1:00pm, 2 [name] At 6:00pm and 1 [name] at bedtime  -continue [name] 2 mg daily.  He questioned whether or not to discontinue this but I think that this medication helps his mood.   2.  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    -no SI/HI  -doesn't want antidepressant.  Discussed remeron and zoloft if needed in future. 3.  REM behavior disorder.  -He has clonazepam but no longer using 4.  Much greater than 50% of this visit was spent in counseling and coordinating care.  Total face to face time:  20 min.  this did not include the programming his DBS which is described in his procedural note.

## 2017-07-06 ENCOUNTER — Encounter: Payer: Self-pay | Admitting: Neurology

## 2017-07-06 MED ORDER — ROPINIROLE HCL ER 2 MG PO TB24: 2.0000 mg | ORAL_TABLET | Freq: Every day | ORAL | 1 refills | Status: DC

## 2017-07-14 NOTE — Progress Notes (Signed)
Physical Therapy Evaluation Addendum for G-Codes    02/17/17 1351  PT G-Codes **NOT FOR INPATIENT CLASS**  Functional Assessment Tool Used AM-PAC 6 Clicks Basic Mobility  Functional Limitation Mobility: Walking and moving around  Mobility: Walking and Moving Around Current Status (W2956(G8978) CJ  Mobility: Walking and Moving Around Goal Status (O1308(G8979) CI   Conni SlipperLaura Ame Heagle, PT, DPT Acute Rehabilitation Services Pager: [phone]

## 2017-07-18 ENCOUNTER — Encounter: Payer: Self-pay | Admitting: Neurology

## 2017-07-24 DIAGNOSIS — Z029 Encounter for administrative examinations, unspecified: Secondary | ICD-10-CM

## 2017-08-10 NOTE — Progress Notes (Signed)
[name] [address] [place], [place] [address] Phone: [phone] Subjective:    I'm seeing this patient by the request  of:    CC: Back pain and back pain  [id number]  Ian [name] is a 56 y.o. male coming in with complaint of low back pain in the back pain. Patient does have a history of Parkinson's disease and has not been seen in quite some time secondary to having a brain stimulator probe placed. Patient is finally starting to increase activity. Had some difficulty with his medications and is now seems to be helped. Patient states Overall he is doing relatively well. Starting to increase his activity recently. Notices some soreness and stiffness but nothing severe. Patient states that he is fully released now from his neurosurgeon as well as neurologist to advance activity as tolerated. Patient did have good results with osteopathic manipulation previously and wants to continue on a regular basis.      Past Medical History:  Diagnosis Date  . History of kidney stones    passed  . PD (Parkinson's disease) (HCC) 09/10/2013  . PONV (postoperative nausea and vomiting)    nausea    Past Surgical History:  Procedure Laterality Date  . COLONOSCOPY    . MINOR PLACEMENT OF FIDUCIAL N/A 01/31/2017   Procedure: MINOR PLACEMENT OF FIDUCIAL;  Surgeon: Maeola Harman, MD;  Location: [facility] OR;  Service: Neurosurgery;  Laterality: N/A;  Fiducial placement  . none    . PULSE GENERATOR IMPLANT Right 02/17/2017   Procedure: Right implantable pulse generator placement;  Surgeon: Maeola Harman, MD;  Location: [facility] OR;  Service: Neurosurgery;  Laterality: Right;  Bilateral implantable pulse generator placement  . SUBTHALAMIC STIMULATOR INSERTION Bilateral 02/10/2017   Procedure: Bilateral Deep brain stimulator placement;  Surgeon: Maeola Harman, MD;  Location: [facility] OR;  Service: Neurosurgery;  Laterality: Bilateral;  Bilateral Deep brain stimulator placement  . UPPER  GASTROINTESTINAL ENDOSCOPY     Social History   Social History  . Marital status: Married    Spouse name: N/A  . Number of children: N/A  . Years of education: N/A   Social History Main Topics  . Smoking status: Never Smoker  . Smokeless tobacco: Never Used  . Alcohol use Yes     Comment: Occ  . Drug use: No  . Sexual activity: Not Asked   Other Topics Concern  . None   Social History Narrative  . None   Allergies  Allergen Reactions  . No Known Allergies    Family History  Problem Relation Age of Onset  . Diabetes Other   . Cancer Other      Past medical history, social, surgical and family history all reviewed in electronic medical record.  No pertanent information unless stated regarding to the chief complaint.   Review of Systems:Review of systems updated and as accurate as of 08/11/17  No headache, visual changes, nausea, vomiting, diarrhea, constipation, dizziness, abdominal pain, skin rash, fevers, chills, night sweats, weight loss, swollen lymph nodes, body aches, joint swelling, muscle aches, chest pain, shortness of breath, mood changes.   Objective  Blood pressure 122/72, pulse 60, height 6\' 4"  (1.93 m), SpO2 98 %. Systems examined below as of 08/11/17   General: No apparent distress alert and oriented x3 mood and affect normal, dressed appropriately. Very mild masked facies.  HEENT: Pupils equal, extraocular movements intact  Respiratory: Patient's speak in full sentences and does not appear short of breath  Cardiovascular:  No lower extremity edema, non tender, no erythema  Skin: Warm dry intact with no signs of infection or rash on extremities or on axial skeleton.  Abdomen: Soft nontender  Neuro: Cranial nerves II through XII are intact, neurovascularly intact in all extremities with 2+ DTRs and 2+ pulses.  Lymph: No lymphadenopathy of posterior or anterior cervical chain or axillae bilaterally.  Gait normal with good balance and coordination.  MSK:   Non tender with full range of motion and good stability and symmetric strength and tone of shoulders, elbows, wrist, hip, knee and ankles bilaterally.  Neck: Inspection unremarkable. No palpable stepoffs. Negative Spurling's maneuver. Lacks last 5 of side bending bilaterally Grip strength and sensation normal in bilateral hands Strength good C4 to T1 distribution No sensory change to C4 to T1 Negative Hoffman sign bilaterally Reflexes normal  Back Exam:  Inspection: Mild loss of lordosis Motion: Flexion 45 deg, Extension 20 deg, Side Bending to 35 deg bilaterally,  Rotation to 30 deg bilaterally  SLR laying: Negative  XSLR laying: Negative  Palpable tenderness: Tender to palpation and appears palmar musculature lumbar spine right greater than left. FABER: Mild tightness right. Sensory change: Gross sensation intact to all lumbar and sacral dermatomes.  Reflexes: 2+ at both patellar tendons, 2+ at achilles tendons, Babinski's downgoing.  Strength at foot  Plantar-flexion: 5/5 Dorsi-flexion: 5/5 Eversion: 5/5 Inversion: 5/5  Leg strength  Quad: 5/5 Hamstring: 5/5 Hip flexor: 5/5 Hip abductors: 5/5  Gait unremarkable.  Osteopathic findings C2 flexed rotated and side bent right C7 flexed rotated and side bent left T3 extended rotated and side bent right inhaled third rib T11 extended rotated and side bent right  L2 flexed rotated and side bent left  Sacrum right on right    Impression and Recommendations:     This case required medical decision making of moderate complexity.      Note: This dictation was prepared with Dragon dictation along with smaller phrase technology. Any transcriptional errors that result from this process are unintentional.

## 2017-08-11 ENCOUNTER — Ambulatory Visit (INDEPENDENT_AMBULATORY_CARE_PROVIDER_SITE_OTHER): Payer: Medicare Other | Admitting: Family Medicine

## 2017-08-11 ENCOUNTER — Encounter: Payer: Self-pay | Admitting: Family Medicine

## 2017-08-11 VITALS — BP 122/72 | HR 60 | Ht 76.0 in

## 2017-08-11 DIAGNOSIS — M999 Biomechanical lesion, unspecified: Secondary | ICD-10-CM

## 2017-08-11 DIAGNOSIS — M940 Chondrocostal junction syndrome [Tietze]: Secondary | ICD-10-CM | POA: Diagnosis not present

## 2017-08-11 NOTE — Patient Instructions (Signed)
Great to see you  See me again in 4-6 weeks.  [phone] Love to meet your son

## 2017-08-11 NOTE — Assessment & Plan Note (Signed)
Patient seems to be doing relatively well. Did have a slipped rib and again though noted today. We discussed icing regimen, home exercises, we discussed the possibility of the vitamin D the patient was to continue with the daily prescription. Patient given an exercise prescription that I think will be beneficial. Patient follow-up again in 4-6 weeks for further evaluation and treatment.

## 2017-08-11 NOTE — Assessment & Plan Note (Signed)
Decision today to treat with OMT was based on Physical Exam  After verbal consent patient was treated with HVLA, ME, FPR techniques in cervical, thoracic, rib lumbar and sacral areas  Patient tolerated the procedure well with improvement in symptoms  Patient given exercises, stretches and lifestyle modifications  See medications in patient instructions if given  Patient will follow up in 4-6 weeks 

## 2017-08-15 ENCOUNTER — Encounter: Payer: Self-pay | Admitting: Neurology

## 2017-08-16 ENCOUNTER — Telehealth: Payer: Self-pay | Admitting: Neurology

## 2017-08-16 NOTE — Telephone Encounter (Signed)
Referral faxed to [facility] at [phone].

## 2017-08-17 ENCOUNTER — Other Ambulatory Visit: Payer: Self-pay | Admitting: *Deleted

## 2017-08-17 DIAGNOSIS — R5382 Chronic fatigue, unspecified: Secondary | ICD-10-CM

## 2017-08-18 ENCOUNTER — Other Ambulatory Visit: Payer: Medicare Other

## 2017-08-18 DIAGNOSIS — R5382 Chronic fatigue, unspecified: Secondary | ICD-10-CM

## 2017-08-22 LAB — EPSTEIN-BARR VIRUS VCA ANTIBODY PANEL
EBV NA IgG: 207 U/mL — ABNORMAL HIGH
EBV VCA IgG: 207 U/mL — ABNORMAL HIGH
EBV VCA IgM: <36 U/mL

## 2017-08-22 LAB — CYTOMEGALOVIRUS ANTIBODY, IGG

## 2017-08-22 NOTE — Progress Notes (Unsigned)
Y 

## 2017-09-13 ENCOUNTER — Encounter: Payer: Self-pay | Admitting: Family Medicine

## 2017-09-13 ENCOUNTER — Ambulatory Visit (INDEPENDENT_AMBULATORY_CARE_PROVIDER_SITE_OTHER): Payer: Medicare Other | Admitting: Family Medicine

## 2017-09-13 VITALS — BP 110/72 | HR 68

## 2017-09-13 DIAGNOSIS — M940 Chondrocostal junction syndrome [Tietze]: Secondary | ICD-10-CM

## 2017-09-13 DIAGNOSIS — G20 Parkinson's disease: Secondary | ICD-10-CM

## 2017-09-13 DIAGNOSIS — M999 Biomechanical lesion, unspecified: Secondary | ICD-10-CM | POA: Diagnosis not present

## 2017-09-13 NOTE — Assessment & Plan Note (Signed)
Secondary to patient's parkinsonism. Patient continues to be active. Discussed with patient at great length about icing regimen, home exercises, which activities doing which ones to avoid. Patient will continue stay active. No significant changes in management today. Follow-up again in 4-6 weeks

## 2017-09-13 NOTE — Assessment & Plan Note (Signed)
Decision today to treat with OMT was based on Physical Exam  After verbal consent patient was treated with HVLA, ME, FPR techniques in cervical, thoracic, rib lumbar and sacral areas  Patient tolerated the procedure well with improvement in symptoms  Patient given exercises, stretches and lifestyle modifications  See medications in patient instructions if given  Patient will follow up in 4-6 weeks 

## 2017-09-13 NOTE — Progress Notes (Signed)
[name] [address] [place], [place] [address] Phone: [phone] Subjective:    I'm seeing this patient by the request  of:    CC: Back pain  [id number]  Ian [name] is a 56 y.o. male coming in with complaint of back pain. Patient is a pain is more of a dull, throbbing aching pain. Patient states that overall is doing relatively better. Patient did have the stimulator place. Patient is strengthening progress overall. Patient is doing the home exercises fairly regularly. Patient is doing relatively well. Continues to go to the gym on a regular basis. Patient is still having significant fatigue but is trying to make progress. Seen and integrated medicine physician to see if can help this.    Past Medical History:  Diagnosis Date  . History of kidney stones    passed  . PD (Parkinson's disease) (HCC) 09/10/2013  . PONV (postoperative nausea and vomiting)    nausea    Past Surgical History:  Procedure Laterality Date  . COLONOSCOPY    . MINOR PLACEMENT OF FIDUCIAL N/A 01/31/2017   Procedure: MINOR PLACEMENT OF FIDUCIAL;  Surgeon: Maeola Harman, MD;  Location: [facility] OR;  Service: Neurosurgery;  Laterality: N/A;  Fiducial placement  . none    . PULSE GENERATOR IMPLANT Right 02/17/2017   Procedure: Right implantable pulse generator placement;  Surgeon: Maeola Harman, MD;  Location: [facility] OR;  Service: Neurosurgery;  Laterality: Right;  Bilateral implantable pulse generator placement  . SUBTHALAMIC STIMULATOR INSERTION Bilateral 02/10/2017   Procedure: Bilateral Deep brain stimulator placement;  Surgeon: Maeola Harman, MD;  Location: [facility] OR;  Service: Neurosurgery;  Laterality: Bilateral;  Bilateral Deep brain stimulator placement  . UPPER GASTROINTESTINAL ENDOSCOPY     Social History   Social History  . Marital status: Married    Spouse name: N/A  . Number of children: N/A  . Years of education: N/A   Social History Main Topics  . Smoking status: Never  Smoker  . Smokeless tobacco: Never Used  . Alcohol use Yes     Comment: Occ  . Drug use: No  . Sexual activity: Not Asked   Other Topics Concern  . None   Social History Narrative  . None   Allergies  Allergen Reactions  . No Known Allergies    Family History  Problem Relation Age of Onset  . Diabetes Other   . Cancer Other      Past medical history, social, surgical and family history all reviewed in electronic medical record.  No pertanent information unless stated regarding to the chief complaint.   Review of Systems:Review of systems updated and as accurate as of 09/13/17  No headache, visual changes, nausea, vomiting, diarrhea, constipation, dizziness, abdominal pain, skin rash, fevers, chills, night sweats, weight loss, swollen lymph nodes, body aches, joint swelling, muscle aches, chest pain, shortness of breath, mood changes.   Objective  Blood pressure 110/72, pulse 68, SpO2 97 %. Systems examined below as of 09/13/17   General: No apparent distress alert and oriented x3 mood and affect normal, dressed appropriately. Very mild masked feces HEENT: Pupils equal, extraocular movements intact  Respiratory: Patient's speak in full sentences and does not appear short of breath  Cardiovascular: No lower extremity edema, non tender, no erythema  Skin: Warm dry intact with no signs of infection or rash on extremities or on axial skeleton.  Abdomen: Soft nontender  Neuro: Cranial nerves II through XII are intact, neurovascularly intact in all  extremities with 2+ DTRs and 2+ pulses.  Lymph: No lymphadenopathy of posterior or anterior cervical chain or axillae bilaterally.  Gait normal with good balance and coordination.  MSK:  Non tender with full range of motion and good stability and symmetric strength and tone of shoulders, elbows, wrist, hip, knee and ankles bilaterally. Patient does have a benign tremor of the left lower extremity.  Osteopathic findings C6 flexed  rotated and side bent left T3 extended rotated and side bent right inhaled third rib T9 extended rotated and side bent left L2 flexed rotated and side bent right L4 flexed rotated and side bent right Sacrum right on right     Impression and Recommendations:     This case required medical decision making of moderate complexity.      Note: This dictation was prepared with Dragon dictation along with smaller phrase technology. Any transcriptional errors that result from this process are unintentional.

## 2017-09-13 NOTE — Patient Instructions (Signed)
[employer] or even target Goal is 75-100grams of protein a day  See me again in 4-6 weeks.

## 2017-09-18 ENCOUNTER — Telehealth: Payer: Self-pay | Admitting: Neurology

## 2017-09-18 DIAGNOSIS — G20 Parkinson's disease: Secondary | ICD-10-CM

## 2017-09-18 NOTE — Telephone Encounter (Signed)
Mychart message sent to patient.

## 2017-09-18 NOTE — Telephone Encounter (Signed)
-----   Message from Octaviano Batty [name], DO sent at 09/18/2017 10:57 AM EDT ----- Please apologize to patient but Caryn Bee cannot come this day.  Work with Caryn Bee to find a time they can both come.  A slot opened tomorrow afternoon but don't think that kevin can do it.  You can use NP slot for it

## 2017-09-19 NOTE — Addendum Note (Signed)
Addended bySilvio Pate on: 09/19/2017 10:24 AM   Modules accepted: Orders

## 2017-09-20 ENCOUNTER — Ambulatory Visit: Payer: Medicare Other | Admitting: Neurology

## 2017-09-25 ENCOUNTER — Ambulatory Visit: Payer: Medicare Other | Admitting: Neurology

## 2017-09-27 ENCOUNTER — Ambulatory Visit: Payer: Medicare Other | Attending: Neurology

## 2017-09-27 DIAGNOSIS — R471 Dysarthria and anarthria: Secondary | ICD-10-CM | POA: Diagnosis present

## 2017-09-27 NOTE — Patient Instructions (Signed)
READ OUT LOUD for 1 minute - 10 times. Think about what strategy works best for you.  Strategies: Over - pronounce/open mouth/overarticulate Putting pause between words Just a general thought of "slow down my talking"

## 2017-09-27 NOTE — Therapy (Signed)
[facility] [address] [place], [place], [address] Phone: [phone]   Fax:  [phone]  Speech Language Pathology Evaluation  Patient Details  Name: Ian [name] MRN: [id number] Date of Birth: 06-10-1961 Referring Provider: Kerin Salen, DO  Encounter Date: 09/27/2017      End of Session - 09/27/17 1552    Visit Number 1   Number of Visits 17   Date for SLP Re-Evaluation 12/08/17   SLP Start Time 0850   SLP Stop Time  0931   SLP Time Calculation (min) 41 min   Activity Tolerance Patient tolerated treatment well      Past Medical History:  Diagnosis Date  . History of kidney stones    passed  . PD (Parkinson's disease) (HCC) 09/10/2013  . PONV (postoperative nausea and vomiting)    nausea     Past Surgical History:  Procedure Laterality Date  . COLONOSCOPY    . MINOR PLACEMENT OF FIDUCIAL N/A 01/31/2017   Procedure: MINOR PLACEMENT OF FIDUCIAL;  Surgeon: Maeola Harman, MD;  Location: [facility] OR;  Service: Neurosurgery;  Laterality: N/A;  Fiducial placement  . none    . PULSE GENERATOR IMPLANT Right 02/17/2017   Procedure: Right implantable pulse generator placement;  Surgeon: Maeola Harman, MD;  Location: [name] OR;  Service: Neurosurgery;  Laterality: Right;  Bilateral implantable pulse generator placement  . SUBTHALAMIC STIMULATOR INSERTION Bilateral 02/10/2017   Procedure: Bilateral Deep brain stimulator placement;  Surgeon: Maeola Harman, MD;  Location: [facility] OR;  Service: Neurosurgery;  Laterality: Bilateral;  Bilateral Deep brain stimulator placement  . UPPER GASTROINTESTINAL ENDOSCOPY      There were no vitals filed for this visit.      Subjective Assessment - 09/27/17 0858    Subjective "It's not volume - it's the fast talking."   Currently in Pain? Yes   Pain Score 4    Pain Location Shoulder   Pain Orientation Right   Pain Descriptors / Indicators Sore   Pain Type Acute pain   Pain Onset 1 to 4 weeks  ago   Pain Frequency Occasional   Aggravating Factors  working out sometimes   Pain Relieving Factors rest, ice, heat            SLP Evaluation [facility] - 09/27/17 0840      SLP Visit Information   SLP Received On 09/27/17   Referring Provider [name], Lurena Joiner, DO   Onset Date 2011   Medical Diagnosis Parkinson's Disease     General Information   HPI Pt with PD since 2011 reported. DBS surgery in March 2018. PT reports he is asked a few times a day at least to repeat due to imprecise speech.     Prior Functional Status   Cognitive/Linguistic Baseline Within functional limits   Type of Home House    Lives With Spouse   Vocation Retired     IT consultant   Overall Cognitive Status Within Functional Limits for tasks assessed     Auditory Comprehension   Overall Auditory Comprehension Appears within functional limits for tasks assessed     Verbal Expression   Overall Verbal Expression Appears within functional limits for tasks assessed     Oral Motor/Sensory Function   Overall Oral Motor/Sensory Function Impaired   Labial ROM Reduced right;Reduced left   Labial Symmetry Within Functional Limits   Labial Strength Within Functional Limits   Labial Coordination Reduced   Lingual Symmetry Within Functional Limits   Lingual Strength Reduced Right  slight   Lingual Coordination Reduced  at phrase/sentence level   Velum Within Functional Limits     Motor Speech   Overall Motor Speech Impaired   Articulation Impaired   Level of Impairment Sentence   Intelligibility Intelligibility reduced   Sentence 75-100% accurate  95-100% overall   Conversation 75-100% accurate  95% overall; worse when nervous (reported)   Motor Speech Errors Aware;Inconsistent   Interfering Components --  bil DBS placement   Effective Techniques Slow rate     AFter discussing pt's deficits/challenges with his speech intelligibility, and SLP educating pt about compensations for reducing speech rate, pt  notably slowed his rate, which markedly improved his intelligibility to nearly 100% in 3 minutes simple to mod complex conversation.                    SLP Education - 09/27/17 1551    Education provided Yes   Education Details compensations for fast rate (overarticulation, pausing between words, general feeling to "slow down")   Person(s) Educated Patient   Methods Explanation;Demonstration;Verbal cues   Comprehension Verbalized understanding;Returned demonstration;Need further instruction          SLP Short Term Goals - 09/27/17 1558      SLP SHORT TERM GOAL #1   Title pt will produce 19/20 sentences with reduced speech rate over 3 sessions   Time 4   Period Weeks   Status New     SLP SHORT TERM GOAL #2   Title pt will read functionally for one minute using slowed speech rate   Time 4   Period Weeks   Status New     SLP SHORT TERM GOAL #3   Title pt will demo slower speech producing >95% intelligible speech in 7 minutes simple conversation outside ST room   Time 4   Period Weeks   Status New          SLP Long Term Goals - 09/27/17 1613      SLP LONG TERM GOAL #1   Title pt will demo slower speech producing >95% intelligible speech in 10 minutes simple to mod complex conversation outside ST room   Time 8   Period Weeks   Status New     SLP LONG TERM GOAL #2   Title pt will report average no more than 2 requests asking him to repeat daily   Time 8   Period Weeks   Status New          Plan - 09/27/17 1553    Clinical Impression Statement Pt presents with conversational speech with rushes of speech occurring occasionally, negatively affecting pt's intelligibility. Pt's speech loudness is WNL. Pt endorses people, even in his family, ask him to repeat average x5/day. Pt shares he feels reduced QOL because of this as well as incr'd anxiety when in larger groups and these challenges with verbal expression have made him decr his willingness to verbally  participate at home and in the community, especially at church. 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 He would benefit from skilled ST focusing on habitualization of slower speech rate in order to improve pt's confidence with speaking.    Speech Therapy Frequency 2x / week   Duration --  8 weeks or 17 visits   Treatment/Interventions Compensatory techniques;Internal/external aids;SLP instruction and feedback;Patient/family education;Functional tasks;Cueing hierarchy;Environmental controls   Potential to Achieve Goals Good  Patient will benefit from skilled therapeutic intervention in order to improve the following deficits and impairments:   Dysarthria and anarthria      G-Codes - 10-25-2017 1615    Functional Assessment Tool Used noms   Functional Limitations Motor speech   Motor Speech Current Status [phone]) At least 20 percent but less than 40 percent impaired, limited or restricted   Motor Speech Goal Status (U0454) At least 1 percent but less than 20 percent impaired, limited or restricted      Problem List Patient Active Problem List   Diagnosis Date Noted  . Antalgic gait 01/24/2017  . Intercostal muscle tear 07/19/2016  . PD (Parkinson's disease) (HCC) 06/24/2016  . 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 Nonallopathic lesion of thoracic region 01/25/2016  . Nonallopathic lesion-rib cage 01/25/2016  . Nonallopathic lesion of cervical region 01/25/2016  . Parkinson's disease (HCC) 01/25/2016  . Lateral epicondylitis 12/01/2014  . REM behavioral disorder 08/27/2014  . Paralysis agitans (HCC) 10/18/2013    Ninfa Giannelli ,MS, CCC-SLP  October 25, 2017, 4:16 PM  [facility] [address] [place], [place], [address] Phone: [phone]   Fax:  [phone]  Name: Ian [name] MRN: [id number] Date of Birth: 04-14-1961

## 2017-10-03 ENCOUNTER — Ambulatory Visit: Payer: Medicare Other | Admitting: Speech Pathology

## 2017-10-03 DIAGNOSIS — R471 Dysarthria and anarthria: Secondary | ICD-10-CM

## 2017-10-03 NOTE — Therapy (Signed)
[facility] [address] [place], [place], [address] Phone: [phone]   Fax:  [phone]  Speech Language Pathology Treatment  Patient Details  Name: Ian [name] MRN: [id number] Date of Birth: April 15, 1961 Referring Provider: Kerin Salen, DO  Encounter Date: 10/03/2017      End of Session - 10/03/17 1449    Visit Number 2   Number of Visits 17   Date for SLP Re-Evaluation 12/08/17   SLP Start Time 1402   SLP Stop Time  1444   SLP Time Calculation (min) 42 min   Activity Tolerance Patient tolerated treatment well      Past Medical History:  Diagnosis Date  . History of kidney stones    passed  . PD (Parkinson's disease) (HCC) 09/10/2013  . PONV (postoperative nausea and vomiting)    nausea     Past Surgical History:  Procedure Laterality Date  . COLONOSCOPY    . MINOR PLACEMENT OF FIDUCIAL N/A 01/31/2017   Procedure: MINOR PLACEMENT OF FIDUCIAL;  Surgeon: Maeola Harman, MD;  Location: [facility] OR;  Service: Neurosurgery;  Laterality: N/A;  Fiducial placement  . none    . PULSE GENERATOR IMPLANT Right 02/17/2017   Procedure: Right implantable pulse generator placement;  Surgeon: Maeola Harman, MD;  Location: [facility] OR;  Service: Neurosurgery;  Laterality: Right;  Bilateral implantable pulse generator placement  . SUBTHALAMIC STIMULATOR INSERTION Bilateral 02/10/2017   Procedure: Bilateral Deep brain stimulator placement;  Surgeon: Maeola Harman, MD;  Location: [facility] OR;  Service: Neurosurgery;  Laterality: Bilateral;  Bilateral Deep brain stimulator placement  . UPPER GASTROINTESTINAL ENDOSCOPY      There were no vitals filed for this visit.      Subjective Assessment - 10/03/17 1410    Subjective "I've been reading for a minute to practice going"   Currently in Pain? No/denies               ADULT SLP TREATMENT - 10/03/17 1420      General Information   Behavior/Cognition Alert;Cooperative;Pleasant mood     Treatment Provided   Treatment provided Cognitive-Linquistic     Cognitive-Linquistic Treatment   Treatment focused on Dysarthria   Skilled Treatment Facilitated reduced rate with reading aloud paragraph level with rare min A. Structurd speech tasks similarities and differences with rare min A for reduced rate and over articulation. Simple conversation re: family trip with occasional min visual and verbal cues for slow rate. Pt reported he is making a large effort to be slow in this session.      Assessment / Recommendations / Plan   Plan Continue with current plan of care     Progression Toward Goals   Progression toward goals Progressing toward goals          SLP Education - 10/03/17 1445    Education provided Yes   Education Details slow rate overarticulation will feel un-natural initially - it is important to continue to practice this out side of therapy   Person(s) Educated Patient   Methods Explanation;Demonstration;Verbal cues   Comprehension Verbalized understanding;Returned demonstration;Need further instruction          SLP Short Term Goals - 10/03/17 1449      SLP SHORT TERM GOAL #1   Title pt will produce 19/20 sentences with reduced speech rate over 3 sessions   Time 4   Period Weeks   Status On-going     SLP SHORT TERM GOAL #2   Title pt will read functionally  for one minute using slowed speech rate   Time 4   Period Weeks   Status On-going     SLP SHORT TERM GOAL #3   Title pt will demo slower speech producing >95% intelligible speech in 7 minutes simple conversation outside ST room   Time 4   Period Weeks   Status On-going          SLP Long Term Goals - 10/03/17 1449      SLP LONG TERM GOAL #1   Title pt will demo slower speech producing >95% intelligible speech in 10 minutes simple to mod complex conversation outside ST room   Time 8   Period Weeks   Status On-going     SLP LONG TERM GOAL #2   Title pt will report average no more than 2  requests asking him to repeat daily   Time 8   Period Weeks   Status On-going          Plan - 10/03/17 1446    Clinical Impression Statement Pt utilized slow rate, pause and overarticulation on structured speech tasks with occasional min A, following repeating multisyllabic words slow and big. He utilized these compensations in simple conversation with occasional min A. Intermitten short rushes of speech occured with cues for awareness of these rushes. Continue skilled ST to maximize intellgibility.   Speech Therapy Frequency 2x / week   Treatment/Interventions Compensatory techniques;Internal/external aids;SLP instruction and feedback;Patient/family education;Functional tasks;Cueing hierarchy;Environmental controls   Potential to Achieve Goals Good      Patient will benefit from skilled therapeutic intervention in order to improve the following deficits and impairments:   Dysarthria and anarthria    Problem List Patient Active Problem List   Diagnosis Date Noted  . Antalgic gait 01/24/2017  . Intercostal muscle tear 07/19/2016  . PD (Parkinson's disease) (HCC) 06/24/2016  . 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 Nonallopathic lesion of thoracic region 01/25/2016  . Nonallopathic lesion-rib cage 01/25/2016  . Nonallopathic lesion of cervical region 01/25/2016  . Parkinson's disease (HCC) 01/25/2016  . Lateral epicondylitis 12/01/2014  . REM behavioral disorder 08/27/2014  . Paralysis agitans (HCC) 10/18/2013    Lovvorn, Radene Journey MS, CCC-SLP 10/03/2017, 2:50 PM  [facility] [address] [place], [place], [address] Phone: [phone]   Fax:  [phone]   Name: Ian [name] MRN: [id number] Date of Birth: 1961/02/20

## 2017-10-05 ENCOUNTER — Ambulatory Visit: Payer: Medicare Other | Admitting: Speech Pathology

## 2017-10-05 DIAGNOSIS — R471 Dysarthria and anarthria: Secondary | ICD-10-CM | POA: Diagnosis not present

## 2017-10-05 NOTE — Patient Instructions (Signed)
  It's OK to feel like you are talking too slow or feel un-natural  When you practice reading at home - go extra slow and take pauses while you read aloud  When you practice with your wife of family, go extra slow  You may have to let your friends and family know that you are working on reducing your rate and they are not to interrupt you when you take pauses  Try to find a signal that you and your wife can use to help to remember to slow your rate   Continue to practice "chunking" - saying 2-4 words at a time, then pause   You can always practice describing items in a topic (animals, Christmas, plants etc) if you are needing some formal structured practice time.

## 2017-10-05 NOTE — Therapy (Signed)
[facility] [address] [place], [place], [address] Phone: [phone]   Fax:  [phone]  Speech Language Pathology Treatment  Patient Details  Name: Ian PurserKenneth [name] MRN: [id number] Date of Birth: July 17, 1961 Referring Provider: [name], [name], DO  Encounter Date: 10/05/2017      End of Session - 10/05/17 1224    Visit Number 3   Number of Visits 17   Date for SLP Re-Evaluation 12/08/17   SLP Start Time 0932   SLP Stop Time  1013   SLP Time Calculation (min) 41 min      Past Medical History:  Diagnosis Date  . History of kidney stones    passed  . PD (Parkinson's disease) (HCC) 09/10/2013  . PONV (postoperative nausea and vomiting)    nausea     Past Surgical History:  Procedure Laterality Date  . COLONOSCOPY    . MINOR PLACEMENT OF FIDUCIAL N/A 01/31/2017   Procedure: MINOR PLACEMENT OF FIDUCIAL;  Surgeon: [name], MD;  [facility];  Service: Neurosurgery;  Laterality: N/A;  Fiducial placement  . none    . PULSE GENERATOR IMPLANT Right 02/17/2017   Procedure: Right implantable pulse generator placement;  Surgeon: [name], MD;  [facility];  Service: Neurosurgery;  Laterality: Right;  Bilateral implantable pulse generator placement  . SUBTHALAMIC STIMULATOR INSERTION Bilateral 02/10/2017   Procedure: Bilateral Deep brain stimulator placement;  Surgeon: [name], MD;  [facility];  Service: Neurosurgery;  Laterality: Bilateral;  Bilateral Deep brain stimulator placement  . UPPER GASTROINTESTINAL ENDOSCOPY      There were no vitals filed for this visit.      Subjective Assessment - 10/05/17 0936    Subjective "I had to tell my wife  that she wasn't supposed to guess too quickly  when I was pracitcing describing slowly"   Currently in Pain? No/denies               ADULT SLP TREATMENT - 10/05/17 0938      General Information   Behavior/Cognition Alert;Cooperative;Pleasant  mood     Treatment Provided   Treatment provided Cognitive-Linquistic     Cognitive-Linquistic Treatment   Treatment focused on Dysarthria   Skilled Treatment Pt required occasional min A for reduced rate reading 10+ word sentences. Structured speech tasks with occasional min visual and verbal cues to maintain appropriate rate of speech. Simple conversation also with occasional min A to keep WNL rate of speech. Pt reports his wife has been reminding him to reduce his rate at home. I suggested they come up with a discreet signal so that she can remind him when they are out with friends.     Assessment / Recommendations / Plan   Plan Continue with current plan of care     Progression Toward Goals   Progression toward goals Progressing toward goals          SLP Education - 10/05/17 1220    Education provided Yes   Education Details educate family/spouse on your compensations for dysarthria,    Person(s) Educated Patient   Methods Explanation;Demonstration;Handout   Comprehension Verbalized understanding          SLP Short Term Goals - 10/05/17 1223      SLP SHORT TERM GOAL #1   Title pt will produce 19/20 sentences with reduced speech rate over 3 sessions   Time 4   Period Weeks   Status On-going     SLP SHORT TERM GOAL #  2   Title pt will read functionally for one minute using slowed speech rate   Time 4   Period Weeks   Status On-going     SLP SHORT TERM GOAL #3   Title pt will demo slower speech producing >95% intelligible speech in 7 minutes simple conversation outside ST room   Time 4   Period Weeks   Status On-going          SLP Long Term Goals - 10/05/17 1224      SLP LONG TERM GOAL #1   Title pt will demo slower speech producing >95% intelligible speech in 10 minutes simple to mod complex conversation outside ST room   Time 8   Period Weeks   Status On-going     SLP LONG TERM GOAL #2   Title pt will report average no more than 2 requests asking him  to repeat daily   Time 8   Period Weeks   Status On-going          Plan - 10/05/17 1221    Clinical Impression Statement Pt required occasional min A today in structured speech tasks and simple conversation to chunk words, and use slow rate for improved inellgibility. He continues to report difficulty communicaitng with his children and friends, continue skilled ST to  maximize intellgibility.   Speech Therapy Frequency 2x / week   Treatment/Interventions Compensatory techniques;Internal/external aids;SLP instruction and feedback;Patient/family education;Functional tasks;Cueing hierarchy;Environmental controls   Potential to Achieve Goals Good      Patient will benefit from skilled therapeutic intervention in order to improve the following deficits and impairments:   Dysarthria and anarthria    Problem List Patient Active Problem List   Diagnosis Date Noted  . Antalgic gait 01/24/2017  . Intercostal muscle tear 07/19/2016  . PD (Parkinson's disease) (HCC) 06/24/2016  . 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 Nonallopathic lesion of thoracic region 01/25/2016  . Nonallopathic lesion-rib cage 01/25/2016  . Nonallopathic lesion of cervical region 01/25/2016  . Parkinson's disease (HCC) 01/25/2016  . Lateral epicondylitis 12/01/2014  . REM behavioral disorder 08/27/2014  . Paralysis agitans (HCC) 10/18/2013    Lovvorn, Radene Journey MS, CCC-SLP 10/05/2017, 12:24 PM  [facility] [address] [place], [place], [address] Phone: [phone]   Fax:  [phone]   Name: Ian [name] MRN: [id number] Date of Birth: September 15, 1961

## 2017-10-16 ENCOUNTER — Ambulatory Visit: Payer: Medicare Other

## 2017-10-16 DIAGNOSIS — R471 Dysarthria and anarthria: Secondary | ICD-10-CM | POA: Diagnosis not present

## 2017-10-16 NOTE — Patient Instructions (Signed)
  [email or web address]    TED talk by Jonnie Kindim Hague  Do a you tube search for Omnicare[employer] Hague!

## 2017-10-16 NOTE — Therapy (Signed)
[facility] [address] [place], [place], [address] Phone: [phone]   Fax:  [phone]  Speech Language Pathology Treatment  Patient Details  Name: Ian [name] MRN: [id number] Date of Birth: Feb 06, 1961 Referring Provider: Kerin Salen, DO  Encounter Date: 10/16/2017      End of Session - 10/16/17 1627    Visit Number 4   Number of Visits 17   Date for SLP Re-Evaluation 12/08/17   SLP Start Time 1533   SLP Stop Time  1620   SLP Time Calculation (min) 47 min   Activity Tolerance Patient tolerated treatment well      Past Medical History:  Diagnosis Date  . History of kidney stones    passed  . PD (Parkinson's disease) (HCC) 09/10/2013  . PONV (postoperative nausea and vomiting)    nausea     Past Surgical History:  Procedure Laterality Date  . COLONOSCOPY    . MINOR PLACEMENT OF FIDUCIAL N/A 01/31/2017   Procedure: MINOR PLACEMENT OF FIDUCIAL;  Surgeon: Maeola Harman, MD;  [facility] OR;  Service: Neurosurgery;  Laterality: N/A;  Fiducial placement  . none    . PULSE GENERATOR IMPLANT Right 02/17/2017   Procedure: Right implantable pulse generator placement;  Surgeon: Maeola Harman, MD;  Location: [facility] OR;  Service: Neurosurgery;  Laterality: Right;  Bilateral implantable pulse generator placement  . SUBTHALAMIC STIMULATOR INSERTION Bilateral 02/10/2017   Procedure: Bilateral Deep brain stimulator placement;  Surgeon: Maeola Harman, MD;  Location: [facility] OR;  Service: Neurosurgery;  Laterality: Bilateral;  Bilateral Deep brain stimulator placement  . UPPER GASTROINTESTINAL ENDOSCOPY      There were no vitals filed for this visit.      Subjective Assessment - 10/16/17 1539    Subjective "We went to [facility] to have a little anniversary weekend."   Currently in Pain? No/denies               ADULT SLP TREATMENT - 10/16/17 1540      General Information   Behavior/Cognition  Alert;Cooperative;Pleasant mood     Treatment Provided   Treatment provided Cognitive-Linquistic     Cognitive-Linquistic Treatment   Treatment focused on Dysarthria   Skilled Treatment SLP used 2-sentence reading to encourage focus on reduced speech rate with less linguistic demand, to begin session. Pt with 90% accuracy/success. SLP incr'd linguistic demand and had pt read 6-8 sentence paragraphs with 85% success. SLP incr'd linguistic demand furhter and had pt generate descriptions from food pictures. Pt maintained reduced rate approx 90% of the time. SLP spoke with pt re: Jonnie Kind, individual with Parkinson's disease who is a speaker over [place] and provided him links for [employer], in order to reduce pt's anxiety about public speaking and about speaking to individuals in general, as pt shared with SLP that his High School reunion was full of feelings of anxiety re: revealing symptoms of his Parkinson's including his speech.      Assessment / Recommendations / Plan   Plan Continue with current plan of care     Progression Toward Goals   Progression toward goals Progressing toward goals            SLP Short Term Goals - 10/16/17 1629      SLP SHORT TERM GOAL #1   Title pt will produce 19/20 sentences with reduced speech rate over 3 sessions   Time 3   Period Weeks   Status On-going     SLP SHORT  TERM GOAL #2   Title pt will read functionally for one minute using slowed speech rate   Time 3   Period Weeks   Status On-going     SLP SHORT TERM GOAL #3   Title pt will demo slower speech producing >95% intelligible speech in 7 minutes simple conversation outside ST room   Time 3   Period Weeks   Status On-going          SLP Long Term Goals - 10/16/17 1629      SLP LONG TERM GOAL #1   Title pt will demo slower speech producing >95% intelligible speech in 10 minutes simple to mod complex conversation outside ST room   Time 7   Period Weeks   Status  On-going     SLP LONG TERM GOAL #2   Title pt will report average no more than 2 requests asking him to repeat daily   Time 7   Period Weeks   Status On-going          Plan - 10/16/17 1627    Clinical Impression Statement Pt required rare min A today in structured speech tasks of 6-8 sentences, and wsa independent with picture description. He continues to report difficulty communicaitng with his family and friends, continue skilled ST to  maximize intellgibility.   Speech Therapy Frequency 2x / week   Duration --  8 weeks or 17 visits   Treatment/Interventions Compensatory techniques;Internal/external aids;SLP instruction and feedback;Patient/family education;Functional tasks;Cueing hierarchy;Environmental controls   Potential to Achieve Goals Good      Patient will benefit from skilled therapeutic intervention in order to improve the following deficits and impairments:   Dysarthria and anarthria    Problem List Patient Active Problem List   Diagnosis Date Noted  . Antalgic gait 01/24/2017  . Intercostal muscle tear 07/19/2016  . PD (Parkinson's disease) (HCC) 06/24/2016  . 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 Nonallopathic lesion of thoracic region 01/25/2016  . Nonallopathic lesion-rib cage 01/25/2016  . Nonallopathic lesion of cervical region 01/25/2016  . Parkinson's disease (HCC) 01/25/2016  . Lateral epicondylitis 12/01/2014  . REM behavioral disorder 08/27/2014  . Paralysis agitans (HCC) 10/18/2013    Dhruvi Crenshaw ,MS, CCC-SLP  10/16/2017, 4:29 PM  [facility] [address] [place], [place], [address] Phone: [phone]   Fax:  [phone]   Name: Ian PurserKenneth [name] MRN: [id number] Date of Birth: September 15, 1961

## 2017-10-16 NOTE — Progress Notes (Signed)
Ian PurserKenneth [name] was seen today in the movement disorders clinic for neurologic consultation at the request of his PCP, Dr. Drue [name].  He has previously seen  Ian OraAndy, Ian L, MD and saw Dr. Edwin [name] in [place].   This patient is accompanied in the office by his spouse who supplements the history.The consultation is for the evaluation of PD.  I did review [name]'s notes.  [name]'s notes are not available to me.    Hist first sx was tremor was R arm tremor.  It was in 2011 according to the patient today, but [name]'s notes indicate that perhaps this was in 2009.  He was dx with enhanced physiologic tremor.  He was started on propranolol with some help, but made him sluggish.  He went to see Dr. Edwin [name] in 2013 for an opinion in Ian [name].  Dr. 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  He was placed on baclofen and Ian [name].  He thinks that the Ian [name] has helped; he is able to turn over in the bed better and move better.  The pt first saw [name] on 09/10/13.  [name] agreed with the Ian [name] and start the patient on Ian [name] 25/100 CR, but the patient is currently only on its once per day.  12/04/13 update:  The pt presents to the clinic today for his PD.  He is exercising faithfully.  He attends the PD exercise class through the rehab center. The pt was changed to the IR formulation of Ian [name] 25/100 last visit and is on it tid.  He is not sure it helps.  Afternoons are better than mornings.  Ian [name] 25/100 is taken at 8am/3:30 pm and bedtime. He is also on Ian [name] xl 6 mg daily.  Continues to have significant tremor.  01/27/14 update:  The patient presents today with his wife, who supplements the history.  The pt has a hx of PD and last visit, we decided to increase his Ian [name] to 2 po tid.  the patient states that he just felt lightheaded on this medication  dosage and up going back to Ian [name] 25/100 CR 3 times per day and then takes Ian [name] 25/100 IR, half tablet twice per day. He remains on Ian [name] xl 6 mg daily.  Overall, he does feel fairly good.  He still has tremor.  He went to see [name] since our last visit and again talked about Ian [name] therapy.  [name] mentioned to him that he looks much better and he had previously and had [name] score had declined.  Some yelling out in the sleep but this has not been a huge issue.  No sleepwalking/talking.  No falls.  He is exercising faithfully.  05/28/14 update:  The patient is presenting today for followup.  Last visit, I increased his Ian [name] XL from 6 mg daily to 4 mg twice a day.  He is doing well with this.  Tremor is about the same.  He is on a strange combination of carbidopa levodopa, but when I tried to switch him off the IR version, the patient just felt lightheaded and ended up going back to a combination of IR and CR.  He is currently on 25/100 CR 3 times per day and then takes Ian [name] 25/100 IR, half tablet twice per day and sometimes three times per day.  He does best in the evening and worst in the AM.   No swallowing troubles.  Has dropped weight but thinks that it is exercise and was trying to follow gluten free diet. He is walking, doing a spinning class for PD, and has started golfing again, which he hasn't done in years.   Still considering Ian [name] therapy.    08/27/14 update:  Patient presents today for followup.  He is currently on Ian [name] XL, 4mg  twice a day.  He is on a strange combination of carbidopa levodopa, but hasn't been able to tolerate the IR version well.[name]  He is currently on 25/100 CR 2-3 times per day and then 50/200 at night.  He rarely takes the IR now; only if playing golf or if he needs a boost during the day.  He is finding that he is having more tremor.  He is also having dreams at night that are making his sleep feel unrestful.  He is strongly  considering deep brain stimulation.  He asks me about details regarding this today.  He has asks me about potentially getting a refill on his Ativan.  He apparently got this filled from an outside physician about a year and a half ago and only had 10 pills in total and is just now running out.  He takes one pill every 3 or 4 months if he has to go to a function with his wife.  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  He asks me if he can have a refill today.  09/10/14 update:  The patient is seen today unexpectedly.  Last visit, I started [name] 145 mg, 2 tablets 3 times per day.  He is also on Ian [name] XL 4 mg twice a day.  He called me initially to tell me that the [name] was working great and he felt much better on the medication.  In fact, he states that his PT and OT reported that he looked much better and his eval scores were markedly improved and they asked him what he had done differently.  He states last week was just a great week.  On Saturday, he was just a little lightheaded and his wife checked his orthostatics and he states that they were negative.  This week, however, he began to report some abnormal movements.  The movements were primarily of the left shoulder and in the abdomen. I wanted to see him on this medication before added or changed anything, as I have never seen him with dyskinesia.  Unfortunately, this morning he only took one of his tablets, but he did take 2 tablets at lunch time about 1 hour before coming.  He has not noticed the movements today.  He has noticed much less tremor overall since starting [name].  11/25/14 update:  The patient returns today for follow-up.  He is on Ian [name] XL 4 mg twice a day and [name] 145 mg, 2 tablets 3 times per day.  His biggest issue is tremor, but states that its under good control today (but he just exercised).  He has developed tendinitis in the elbow.  He has clonazepam for REM behavior  disorder but admits he doesn't use it.  The patient remains very active in our Parkinson's exercise classes.  No falls.  Some days he does have to "fight" the posture and the "shuffling."  He didn't ever get to see Dr. Venetia Maxon as he states that his life got hectic moving his mother in law.    02/20/15 update:  The patient returns today, accompanied by his  wife who supplements the history.  Last visit, I increased his [name] to 195 mg, and he is taking 2 tablets 3 times a day.  He is also on Ian [name] XL 4 mg twice a day.  He reports that he has not been doing as well because stress levels have been increased.  His mother-in-law, who is living with them, died unexpectedly.  Because of that, he has had more trouble sleeping as well.  He is still faithfully attending exercise classes and is involved in the Parkinson's community.  He has noticed more rigidity and tremor.  He is taking melatonin, 5 Mill grams at night to help him sleep but does not want to use clonazepam, even though he has at home.  05/24/25 update:  The patient returns today for follow-up.  His wife accompanies him and supplements the history.  He is on Ian [name] XL 4 mg twice a day and [name] 195 mg, 2 tablets 3 times per day.  He has not wanted to increase the dose, although he likely could use more levodopa. States that he is more tremulous right now and attributes that to the fact that he has had a GI illness recently, which made sx's worse.  He has had emesis (one episode).  No diarrhea.  No fevers.    He has not had any falls.  He denies any hallucinations.  No confusion.  No lightheadedness or near syncope.  He continues to exercise faithfully.  06/12/15 update:  The patient is following up today, accompanied by his wife who supplements the history.  When I saw him last on 05/22/15, he was obviously underdosed and I expressed the need for more levodopa but the pt didn't want to increase it as he felt that his viral gastroenteritis was just causing his  worsening sx's.  He didn't tell me then that he had been off of his meds for 3 days and that is the reason he didn't look well.  They called me on 06/10/2015 however to state that he was doing much worse.  They wanted me to work him in that day, but I was not able to.  I didn't realize that he had been off all his PD meds since June 1 and had started receiving care from [place] natureopath in [place], Dr. Beckie BusingUrban.  He was given information on Dr. Beckie BusingUrban by his dentist Dr. Mellody DanceKeith, who told him he should try amino acid therapy.  He researched and started seeing Dr. Beckie BusingUrban and was told he needed to be off all his PD meds.  He started on D5-mucuna which markets itself as a "safe, natural" levodopa therapy.  He was told that he needed 4-6 months to see a response, but admits that he was told to tell his doctor what he was doing.  He was increasing the dose of this supplement based on urine dopamine levels which they were told were low.  They apparently were also measuring urine serotonin levels.  Not surprisingly, the patient decompensated.  He states that initially he didn't "feel that bad" but the "tremor was awful."  He also states that he had terrible vivid dreams and ultimately became exhausted from not sleeping, shaking and increased rigidity. It got so bad that he ended up in the ER 2 days ago.    He is back on rytary 195, 2 po tid.  He is a little quesy since being back on it.  He has only been on it for 2 days.  He states that  he talked with the natureopath and she told him to just "start back on it."  He is off of the D5-mucuna.  He and his wife bring in several articles for me to read today.    09/29/15 update:  The patient is following up today, accompanied by his wife who supplements the history.  He is on Ian [name] XL 4 mg twice a day.  He reports that he is now taking  [name] 195 mg, 2 tablets 3 times a day along with [name] 145 mg, one tablet 3 times a day.  He spreads it out so that he takes it at 8 AM/3 PM/11 PM.   He does not necessarily halfway he describes as "traditional wearing off" but states that at the end of the dose he just does not feel good.  Overall, the patient states that he has been doing okay but he also feels that things are more unpredictable.  He feels more tired and thinks that he has mild dyskinesia.  When he eats, he is biting his lip in the same place.   He denies any lightheadedness or near syncope.  He denies falls.  When asked about depression, he states that he thinks "depression is a strong word" but admits that he gets frustrated and overwhelmed about the disease but uses exercise as a release.  He has sent me paperwork since last visit for his disability which has been completed.  He asks me about writing another letter regarding his VA disability as well.  12/30/15 update:  The patient is following up today.  Last visit, I increased his Ian [name] XL to 6 mg twice a day.  His [name] has been slightly increased, so that he is on 195 mg, 3 tablets 3 times per day and he added an additional 1 tablet at night because he was awakening at 4 AM with tremor. He has changed that again, but taking the same overall dose of rytary, but is now taking 195, 3 in the AM, 2 in the afternoon, 3 in the evening and then takes 2 at bedtime.   Overall, he thinks he feels that it is a little inconsistent; sometimes he does well and sometimes he doesn't.  He continues to faithfully exercise.  No falls.  No hallucinations.  No syncope but has had some lightheadedness; he has had a uri and isn't sure if it is related to that/meds for that.  Mood has been good.  Sleep has been better since he has been back on his medications.  Appetite has been good.  No new medical issues.  He is still going to integrative therapies and thinks that it is helpful.  However, he did some therapy called alpha stim and was told that it changed the alpha waves in the brain and he didn't think that it was helpful and thinks that it made him feel  strange.  Feels that he has minor dyskinesia, especially of the arm when he walks or bends over.  Asks about Ian [name] therapy and thinks he is going to consider that more this year.    03/03/16 update:  The patient is following up today.  He is on Ian [name] XL, 6 mg twice a day and [name] 195 mg on the following schedule: 3/3/3/2.  This is a little higher than last visit (prior 3/2/3/2).  He denies any falls since last visit.  He denies lightheadedness or near syncope.  No hallucinations.  Tremor has been pretty good, but it will fluctuate throughout the  day (states that he did 2 workouts today).  He does occasionally feel like he has some dyskinesia.  He saw Dr. [name] on February 6 and March 13 and I reviewed those records.  OMT was performed.  The patient feels that that was helpful for his musculoskeletal complaints.  He is doing lots of exercises.  Mood is stable.  Asks about meeting with Dr. Venetia Maxon for Ian [name] but wife not ready to pursue.    06/24/16 update:  The patient is following up today, accompanied by his wife who supplements the history.    He is here for her levodopa challenge test.  He saw Dr. Venetia Maxon in May and is interested in Ian [name] therapy.  Last visit, I increased his Ian [name] XL so that he is taking 4 mg, 2 tablets twice a day.  He remains on [name] 195 mg on the following schedule: 3/3/3/2.  He takes clonazepam as needed for REM behavior disorder.  He has not had falls since last visit.  He has seen Dr. [name] several times and I reviewed those records.  He has not had any lightheadedness or near syncope.  He continues to exercise faithfully.  10/18/16 update:  Pt f/u today.  Wife doesn't accompany today.  He saw Dr. Alinda Dooms since our last visit and no evidence of cognitive deficits.  Pt on Ian [name] XL 4 mg - 2 po bid and rytary 195 mg: and he has changed that from 3/3/3/2 to 4/3/3/3.  He has a little more off time with more tremor and slowness.  In late summer, he was putting a putting green into his  back yard and states that he tore an intercostal muscle so he couldn't exercise for 8-9 weeks because of pain with breathing.  Started back to working out 3-4 weeks ago.   01/14/16 update:  Patient follows up today, as he has some questions regarding Ian [name] therapy.  Since our last visit, the [facility] device for deep brain stimulation has been FDA approved.  The patient and I discussed the device over the telephone, and he subsequently read about the device and thought that he would prefer to have this particular device and presents today to talk about it in more detail.  He remains on Ian [name] XL, 4 mg, and takes 2 tablets twice per day.  He also takes [name] 195 mg: 4/3/3/3.  Last visit, I gave him some Ian [name] 25/100 to use as needed and he states that he tried that but it didn't work well for him and so he isn't using that much.  01/31/17 update:  Patient follows up today for preop Ian [name] recording.  Wife present and supplements history.  He had fiducials placed today.  He was to hold his meds this AM but didn't and feels like he is "on."    03/13/17 update:  Patient follows up today.  Patient is accompanied by his twin sister and wife who supplements the history.  The patient underwent bilateral STN Ian [name] with [facility] [employer] device on 02/10/2017 and had IPG placement on 02/17/2017.  The patient has recovered nicely postoperatively.  He had some increase dyskinesia and emailed me about that.  I did tell him he could slightly decrease his [name] if he would like.  He states that he has been taking [name] 195, 3 po tid and Ian [name] XL, 4 mg, 2 tablets twice per day.  He held his Ian [name] today and last took [name], 3 of them at 8 am.  He has had  no falls.  No lightheadedness or near syncope.  03/20/17 update:  Patient is seen today back in follow-up.  He is accompanied by his wife who supplements the history.  His device was activated one week ago, but we did see him today after activation as he was  having dyskinesia on the right side of the body.  The patient relates that the dyskinesia seem to be related to taking [name] 195 mg, 2 tablets and 1 Ian [name] XL 4 mg.  He ended up not taking the medication and the dyskinesia when away.  However, when he was seen back to adjust the programming, he was more rigid, but was reluctant for me to change anything because he no longer had the dyskinesia.  We went up slightly on the left hemibody (right brain) and gave him some control over the left brain to go up and down.  He is still having some tremor in his feet.  He states that he is only taking med prn.  He took 1 Ian [name] on Friday, none on Saturday, 1 Ian [name] and 1 rytary yesterday.  He feels that he did well yesterday with tremor and slight dyskinesia.  Wife states that she notices that patient has been "melancholy."  Part of this is because of the frustration with balance of tremor and dyskinesia and part of it is because he has not been back to exercising lately.  04/14/17 update:  Patient is seen back today in follow-up.  He has adjusted his Ian [name] some on his own.  He states that he has had some difficulty in balancing symptoms between the left and right side of the body.  He has also been having some challenges with mood.  He was taking Ian [name] 25/100, 2-3 times per day, but this past week he ended up switching to [name] 195 mg and has been taking that 3 times per day.  He does think that that has helped mood somewhat.  He is back to exercising.  He has not fallen.  06/23/17 update:  Patient is seen today in follow-up.  He is currently on [name], 145 mg and takes 2 to 3 [name] at 8:00am, 2 [name] at 1:00pm, 2 [name] At 6:00pm and 1 [name] at bedtime.  He has been off and on the Ian [name].  He initially thought it made him fatigued, but went back to taking the 2 mg tablets and is on the XL.  Thinks that the XL gives him some dyskinesia.   Nonmotor symptoms such as fatigue and apathy have been the biggest  issues.  He really has not wanted to take any antidepressants.  States that he is not depressed and taking supplement of amino acid triptophan and he feels apathy is better.  He has struggled with voice strength.  He has not had falls.  He is back to exercise.  For the first time in a long time, he was able to walk the entire golf course and play a round of golf without a feeling of wearing off of meds.  10/17/17 update: Patient seen today in follow-up for Parkinson's disease, status post Ian [name] surgery.  He is on rytary, 145 mg and takes 2-3 [name] at 8 AM, 2 [name] at 1 PM, 2 [name] at 6 PM and 1 [name] at bedtime.  He is on ropinirole XL, 2 mg daily.  No falls.  No lightheadedness or near syncope.  No hallucinations.  No compulsive behaviors.  No sleep attacks.  He is doing spin class 2 days  per week, lifting weights 4 days per week, [employer] classs.  He is busy with church.  Not much dyskinesia.  Not much tremor.  He turned [name] side down a little.  He is going to [place] with [employer] to tell his story.  He is fatigued but doesn't want medication for that  PREVIOUS MEDICATIONS: Ian [name] and neupro (tried few days only); propranolol helped tremor some  ALLERGIES:   Allergies  Allergen Reactions  . No Known Allergies     CURRENT MEDICATIONS:   Allergies as of 10/17/2017      Reactions   No Known Allergies       Medication List       Accurate as of 10/17/17  4:42 PM. Always use your most recent med list.          Carbidopa-Levodopa ER 36.25-145 MG Cpcr Commonly known as:  [name] Take 1 tablet by mouth See admin instructions. 3 in the morning, 2 in the afternoon, 2 in the evening, 1 at night   LECITHIN PO Take 1 capsule by mouth daily.   MULTI-VITAMINS Tabs Take 1 tablet by mouth 2 (two) times daily.   niacin 500 MG tablet Take 500 mg by mouth 3 (three) times daily with meals.   rOPINIRole 2 MG 24 hr tablet Commonly known as:  Ian [name] XL Take 1 tablet (2 mg total) by mouth daily.    vitamin C 1000 MG tablet Take 5,000 mg by mouth daily.   Vitamin D-3 5000 units Tabs Take 5,000 Units by mouth daily.   vitamin E 400 UNIT capsule Take 400 Units by mouth daily.        PAST MEDICAL HISTORY:   Past Medical History:  Diagnosis Date  . History of kidney stones    passed  . PD (Parkinson's disease) (HCC) 09/10/2013  . PONV (postoperative nausea and vomiting)    nausea     PAST SURGICAL HISTORY:   Past Surgical History:  Procedure Laterality Date  . COLONOSCOPY    . MINOR PLACEMENT OF FIDUCIAL N/A 01/31/2017   Procedure: MINOR PLACEMENT OF FIDUCIAL;  Surgeon: Maeola Harman, MD;  Location: [facility] OR;  Service: Neurosurgery;  Laterality: N/A;  Fiducial placement  . none    . PULSE GENERATOR IMPLANT Right 02/17/2017   Procedure: Right implantable pulse generator placement;  Surgeon: Maeola Harman, MD;  Location: [facility] OR;  Service: Neurosurgery;  Laterality: Right;  Bilateral implantable pulse generator placement  . SUBTHALAMIC STIMULATOR INSERTION Bilateral 02/10/2017   Procedure: Bilateral Deep brain stimulator placement;  Surgeon: Maeola Harman, MD;  Location: [facility] OR;  Service: Neurosurgery;  Laterality: Bilateral;  Bilateral Deep brain stimulator placement  . UPPER GASTROINTESTINAL ENDOSCOPY      SOCIAL HISTORY:   Social History   Social History  . Marital status: Married    Spouse name: N/A  . Number of children: N/A  . Years of education: N/A   Occupational History  . Not on file.   Social History Main Topics  . Smoking status: Never Smoker  . Smokeless tobacco: Never Used  . Alcohol use Yes     Comment: Occ  . Drug use: No  . Sexual activity: Not on file   Other Topics Concern  . Not on file   Social History Narrative  . No narrative on file    FAMILY HISTORY:   Family Status  Relation Status  . Mother Deceased at age 76       Breast Cancer  . Father  Alive       Diabetes  . Sister Alive       healthy  . Daughter Alive       2, twins  . Son  Alive  . Other (Not Specified)    ROS: A complete 10 system review of systems was obtained and was unremarkable apart from what is mentioned above.  PHYSICAL EXAMINATION:    VITALS:   Vitals:   10/17/17 1327  BP: 118/62  Pulse: 86  SpO2: 97%  Weight: 170 lb (77.1 kg)  Height: 6\' 2"  (1.88 m)   Wt Readings from Last 3 Encounters:  10/17/17 170 lb (77.1 kg)  10/17/17 169 lb (76.7 kg)  06/23/17 168 lb (76.2 kg)    GEN:  The patient appears stated age and is in NAD. HEENT:  Normocephalic.  Fiducials in place.  Bandaids over fiducial areas.  Neurological examination:  Orientation: The patient is alert and oriented x3.  Cranial nerves: There is good facial symmetry. The speech is fluent and clear. Soft palate rises symmetrically and there is no tongue deviation. Hearing is intact to conversational tone. Sensation: Sensation is intact to light touch throughout.  Movement Examination: Tone: normal in the LUE and mild rigidity in the LUE Abnormal movements: There is very rare tremor in the [name] foot  Coordination:  There is normal rapid alternating movements. Gait and Station: The patient has no difficulty arising out of a deep-seated chair without the use of the hands.  He has no start hesitation and doesn't turn en bloc. He walks very well but there is decreased arm swing on the left more than right (improved post Ian [name])  He has a negative pull test.  Ian [name] activation was performed today and is described in more detail in a separate programming procedure note.   ASSESSMENT/PLAN:  1. idiopathic Parkinson's disease.  The patient has tremor, bradykinesia, rigidity and mild postural instability.  -The patient underwent bilateral STN Ian [name] with [facility] [employer] device on 02/10/2017 and had IPG placement on 02/17/2017.  -Continue [name], 145 mg and takes 2  [name] at 8:00am, 2 [name] at 1:00pm, 2 [name] At 6:00pm and 1 [name] at bedtime  -continue Ian [name] XL 2 mg daily.   2.  Fatigue  -could  try provigil but he wants to hold 3.  REM behavior disorder.  -He has clonazepam but no longer using 4.  Much greater than 50% of this visit was spent in counseling and coordinating care.  Total face to face time:  20 min.  this did not include the analyzing his Ian [name] which is described in his procedural note.

## 2017-10-17 ENCOUNTER — Encounter: Payer: Self-pay | Admitting: Family Medicine

## 2017-10-17 ENCOUNTER — Ambulatory Visit (INDEPENDENT_AMBULATORY_CARE_PROVIDER_SITE_OTHER): Payer: Medicare Other | Admitting: Neurology

## 2017-10-17 ENCOUNTER — Encounter: Payer: Self-pay | Admitting: Neurology

## 2017-10-17 ENCOUNTER — Ambulatory Visit (INDEPENDENT_AMBULATORY_CARE_PROVIDER_SITE_OTHER): Payer: Medicare Other | Admitting: Family Medicine

## 2017-10-17 VITALS — BP 118/62 | HR 86 | Ht 74.0 in | Wt 170.0 lb

## 2017-10-17 VITALS — BP 115/60 | HR 67 | Ht 74.0 in | Wt 169.0 lb

## 2017-10-17 DIAGNOSIS — M940 Chondrocostal junction syndrome [Tietze]: Secondary | ICD-10-CM

## 2017-10-17 DIAGNOSIS — R5383 Other fatigue: Secondary | ICD-10-CM

## 2017-10-17 DIAGNOSIS — Z9689 Presence of other specified functional implants: Secondary | ICD-10-CM | POA: Diagnosis not present

## 2017-10-17 DIAGNOSIS — M999 Biomechanical lesion, unspecified: Secondary | ICD-10-CM | POA: Diagnosis not present

## 2017-10-17 DIAGNOSIS — G20 Parkinson's disease: Secondary | ICD-10-CM

## 2017-10-17 MED ORDER — CARBIDOPA-LEVODOPA ER 36.25-145 MG PO CPCR: 1.0000 | ORAL_CAPSULE | ORAL | 5 refills | Status: DC

## 2017-10-17 MED ORDER — ROPINIROLE HCL ER 2 MG PO TB24: 2.0000 mg | ORAL_TABLET | Freq: Every day | ORAL | 1 refills | Status: DC

## 2017-10-17 NOTE — Patient Instructions (Signed)
Good to see you  Ice is your friend Consider massage  See me again in 5-6 weeks

## 2017-10-17 NOTE — Assessment & Plan Note (Addendum)
Decision today to treat with OMT was based on Physical Exam  After verbal consent patient was treated with HVLA, ME, FPR techniques in cervical, thoracic, rib, lumbar and sacral areas  Patient tolerated the procedure well with improvement in symptoms  Patient given exercises, stretches and lifestyle modifications  See medications in patient instructions if given  Patient will follow up in 4 weeks 

## 2017-10-17 NOTE — Procedures (Signed)
DBS Programming was performed.    Total time spent programming was 10 minutes.  Device was confirmed to be on.    Soft start was turned on at 10.  Impedences were checked and were within normal limits.  Battery was checked and was fully recharged.  Final settings were as follows:  Left brain electrode:     3-C+            ; Amplitude  3.4   mamps (can adjust 2.0-3.7)   ; Pulse width 60 microseconds;   Frequency   130   Hz.  Right brain electrode:     10-30-09+ (with 10% at 12- and 90% at 11-)   ; Amplitude   3.3  mamps (can adjust 1.0-3.7);  Pulse width 60  microseconds;  Frequency   130    Hz.  (10-C+ with L shoulder dyskinesia at 0.527mAmp) (11-C+ with same at 0.5 mAmp) (11-10+ with same at 0.268mAmp)

## 2017-10-17 NOTE — Progress Notes (Signed)
[name] [address] [place], [place] [address] Phone: [phone] Subjective:    I'm seeing this patient by the request  of:    CC: Neck and low back pain  [id number]  Ian [name] is a 56 y.o. male coming in with complaint of neck and low back pain. Past mental history significant for Parkinson's status post deep brain stability. Has been doing relatively well. Some increasing tightness recently. Denies any numbness. Rates the severity of pain is 4 out of 10. Feels more stiff than he has been recently. No changes in medications recently.      Past Medical History:  Diagnosis Date  . History of kidney stones    passed  . PD (Parkinson's disease) (HCC) 09/10/2013  . PONV (postoperative nausea and vomiting)    nausea    Past Surgical History:  Procedure Laterality Date  . COLONOSCOPY    . MINOR PLACEMENT OF FIDUCIAL N/A 01/31/2017   Procedure: MINOR PLACEMENT OF FIDUCIAL;  Surgeon: Maeola Harman, MD;  Location: [facility] OR;  Service: Neurosurgery;  Laterality: N/A;  Fiducial placement  . none    . PULSE GENERATOR IMPLANT Right 02/17/2017   Procedure: Right implantable pulse generator placement;  Surgeon: Maeola Harman, MD;  Location: [facility] OR;  Service: Neurosurgery;  Laterality: Right;  Bilateral implantable pulse generator placement  . SUBTHALAMIC STIMULATOR INSERTION Bilateral 02/10/2017   Procedure: Bilateral Deep brain stimulator placement;  Surgeon: Maeola Harman, MD;  Location: [facility] OR;  Service: Neurosurgery;  Laterality: Bilateral;  Bilateral Deep brain stimulator placement  . UPPER GASTROINTESTINAL ENDOSCOPY     Social History   Social History  . Marital status: Married    Spouse name: N/A  . Number of children: N/A  . Years of education: N/A   Social History Main Topics  . Smoking status: Never Smoker  . Smokeless tobacco: Never Used  . Alcohol use Yes     Comment: Occ  . Drug use: No  . Sexual activity: Not Asked   Other Topics  Concern  . None   Social History Narrative  . None   Allergies  Allergen Reactions  . No Known Allergies    Family History  Problem Relation Age of Onset  . Diabetes Other   . Cancer Other      Past medical history, social, surgical and family history all reviewed in electronic medical record.  No pertanent information unless stated regarding to the chief complaint.   Review of Systems:Review of systems updated and as accurate as of 10/17/17  No headache, visual changes, nausea, vomiting, diarrhea, constipation, dizziness, abdominal pain, skin rash, fevers, chills, night sweats, weight loss, swollen lymph nodes, body aches, joint swelling, muscle aches, chest pain, shortness of breath, mood changes.   Objective  Blood pressure 115/60, pulse 67, height 6\' 2"  (1.88 m), weight 169 lb (76.7 kg), SpO2 98 %. Systems examined below as of 10/17/17   General: No apparent distress alert and oriented x3 mood and affect normal, dressed appropriately.  HEENT: Pupils equal, extraocular movements intact  Respiratory: Patient's speak in full sentences and does not appear short of breath  Cardiovascular: No lower extremity edema, non tender, no erythema  Skin: Warm dry intact with no signs of infection or rash on extremities or on axial skeleton.  Abdomen: Soft nontender  Neuro: Cranial nerves II through XII are intact, neurovascularly intact in all extremities with 2+ DTRs and 2+ pulses.  Lymph: No lymphadenopathy of posterior or anterior cervical  chain or axillae bilaterally.  Gait normal with good balance and coordination.  MSK:  Non tender with full range of motion and good stability and symmetric strength and mild increase tone of shoulders, elbows, wrist, hip, knee and ankles bilaterally.  Osteopathic findings Cervical C2 flexed rotated and side bent right C4 flexed rotated and side bent left C6 flexed rotated and side bent left T3 extended rotated and side bent right inhaled third  rib T9 extended rotated and side bent left L2 flexed rotated and side bent right Sacrum right on right       Impression and Recommendations:     This case required medical decision making of moderate complexity.      Note: This dictation was prepared with Dragon dictation along with smaller phrase technology. Any transcriptional errors that result from this process are unintentional.

## 2017-10-17 NOTE — Assessment & Plan Note (Signed)
Stable  Discussed HEP Discussed posture RTC in 4 weeks.

## 2017-10-19 ENCOUNTER — Ambulatory Visit: Payer: Medicare Other | Attending: Neurology | Admitting: Speech Pathology

## 2017-10-19 DIAGNOSIS — R471 Dysarthria and anarthria: Secondary | ICD-10-CM | POA: Diagnosis present

## 2017-10-19 NOTE — Therapy (Signed)
[facility] [address] [place], [place], [address] Phone: [phone]   Fax:  [phone]  Speech Language Pathology Treatment  Patient Details  Name: Ian PurserKenneth Goshorn MRN: [id number] Date of Birth: Apr 14, 1961 Referring Provider: [name], [name], DO  Encounter Date: 10/19/2017      End of Session - 10/19/17 0925    Visit Number 5   Number of Visits 17   Date for SLP Re-Evaluation 12/08/17   SLP Start Time 0845   SLP Stop Time  0925   SLP Time Calculation (min) 40 min   Activity Tolerance Patient tolerated treatment well      Past Medical History:  Diagnosis Date  . History of kidney stones    passed  . PD (Parkinson's disease) (HCC) 09/10/2013  . PONV (postoperative nausea and vomiting)    nausea     Past Surgical History:  Procedure Laterality Date  . COLONOSCOPY    . MINOR PLACEMENT OF FIDUCIAL N/A 01/31/2017   Procedure: MINOR PLACEMENT OF FIDUCIAL;  Surgeon: [name], MD;  [facility];  Service: Neurosurgery;  Laterality: N/A;  Fiducial placement  . none    . PULSE GENERATOR IMPLANT Right 02/17/2017   Procedure: Right implantable pulse generator placement;  Surgeon: [name], MD;  [facility];  Service: Neurosurgery;  Laterality: Right;  Bilateral implantable pulse generator placement  . SUBTHALAMIC STIMULATOR INSERTION Bilateral 02/10/2017   Procedure: Bilateral Deep brain stimulator placement;  Surgeon: [name], MD;  [facility];  Service: Neurosurgery;  Laterality: Bilateral;  Bilateral Deep brain stimulator placement  . UPPER GASTROINTESTINAL ENDOSCOPY      There were no vitals filed for this visit.      Subjective Assessment - 10/19/17 0850    Subjective "I told Baldo AshCarl, I went to my class reunion and it was an interesting experience trying to go slow talking"   Currently in Pain? No/denies               ADULT SLP TREATMENT - 10/19/17 0855      General Information   Behavior/Cognition Alert;Cooperative;Pleasant mood     Treatment Provided   Treatment provided Cognitive-Linquistic     Cognitive-Linquistic Treatment   Treatment focused on Dysarthria   Skilled Treatment Utilized paragraph reading to reduce rate with 90% accuracy. Pt maintained appropriate rate with rare min visual cues over 15 minutes of conversation.. Discussed with pt to use external reminders for slow rate when he feels "off" or is going to be in a more distracted or anxious situation.     Assessment / Recommendations / Plan   Plan Continue with current plan of care     Progression Toward Goals   Progression toward goals Progressing toward goals          SLP Education - 10/19/17 [phone]    Education provided Yes   Education Details continue slow rate practice with spouse, consider external reminders for rate of speech    Person(s) Educated Patient   Methods Explanation;Demonstration;Handout   Comprehension Verbalized understanding          SLP Short Term Goals - 10/19/17 0925      SLP SHORT TERM GOAL #1   Title pt will produce 19/20 sentences with reduced speech rate over 3 sessions   Baseline 10/19/17;   Time 3   Period Weeks   Status On-going     SLP SHORT TERM GOAL #2   Title pt will read functionally for one minute using  slowed speech rate   Time 3   Period Weeks   Status On-going     SLP SHORT TERM GOAL #3   Title pt will demo slower speech producing >95% intelligible speech in 7 minutes simple conversation outside ST room   Time 3   Period Weeks   Status On-going          SLP Long Term Goals - 10/19/17 1610      SLP LONG TERM GOAL #1   Title pt will demo slower speech producing >95% intelligible speech in 10 minutes simple to mod complex conversation outside ST room   Time 7   Period Weeks   Status On-going     SLP LONG TERM GOAL #2   Title pt will report average no more than 2 requests asking him to repeat daily   Time 7   Period Weeks    Status On-going          Plan - 10/19/17 0924    Clinical Impression Statement Pt required rare min A today in structured speech tasks of 6-8 sentences, and wsa independent with picture description. He continues to report difficulty communicaitng with his family and friends, continue skilled ST to  maximize intellgibility.   Speech Therapy Frequency 2x / week   Treatment/Interventions Compensatory techniques;Internal/external aids;SLP instruction and feedback;Patient/family education;Functional tasks;Cueing hierarchy;Environmental controls   Potential to Achieve Goals Good      Patient will benefit from skilled therapeutic intervention in order to improve the following deficits and impairments:   Dysarthria and anarthria    Problem List Patient Active Problem List   Diagnosis Date Noted  . Antalgic gait 01/24/2017  . Intercostal muscle tear 07/19/2016  . PD (Parkinson's disease) (HCC) 06/24/2016  . 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 Nonallopathic lesion of thoracic region 01/25/2016  . Nonallopathic lesion-rib cage 01/25/2016  . Nonallopathic lesion of cervical region 01/25/2016  . Parkinson's disease (HCC) 01/25/2016  . Lateral epicondylitis 12/01/2014  . REM behavioral disorder 08/27/2014  . Paralysis agitans (HCC) 10/18/2013    Varnell Donate, Radene Journey MS, CCC-SLP 10/19/2017, 9:26 AM  [facility] [address] [place], [place], [address] Phone: [phone]   Fax:  [phone]   Name: Bartholomew Ramesh MRN: [id number] Date of Birth: 11-Mar-1961

## 2017-10-19 NOTE — Patient Instructions (Signed)
  When you are feeling "off" consider tapping foot or hand slowly in a rhythm to help you reduce your rate of speech   You can use a visual reminder, such as a loose rubber band or switching your watch, to help your awareness of rate of speech  If you speak on the phone in a particular area, you can put a visual reminder up on a wall, computer screen (sticker, post-it note)  Practice reading extra slow at home to recalibrate your rate of speech

## 2017-10-23 ENCOUNTER — Ambulatory Visit: Payer: Medicare Other | Admitting: Speech Pathology

## 2017-10-27 ENCOUNTER — Ambulatory Visit: Payer: Medicare Other

## 2017-10-27 DIAGNOSIS — R471 Dysarthria and anarthria: Secondary | ICD-10-CM | POA: Diagnosis not present

## 2017-10-27 NOTE — Therapy (Signed)
[facility] [address] [place], [place], [address] Phone: [phone]   Fax:  [phone]  Speech Language Pathology Treatment  Patient Details  Name: Ian PurserKenneth [name] MRN: [id number] Date of Birth: 04-14-1961 Referring Provider: [name], [name], DO   Encounter Date: 10/27/2017  End of Session - 10/27/17 1730    Visit Number  6    Number of Visits  17    Date for SLP Re-Evaluation  12/08/17    SLP Start Time  1103    SLP Stop Time   1145    SLP Time Calculation (min)  42 min    Activity Tolerance  Patient tolerated treatment well       Past Medical History:  Diagnosis Date  . History of kidney stones    passed  . PD (Parkinson's disease) (HCC) 09/10/2013  . PONV (postoperative nausea and vomiting)    nausea     Past Surgical History:  Procedure Laterality Date  . COLONOSCOPY    . none    . UPPER GASTROINTESTINAL ENDOSCOPY      There were no vitals filed for this visit.  Subjective Assessment - 10/27/17 1110    Subjective  Pt arrives speaking quickly today.    Currently in Pain?  No/denies            ADULT SLP TREATMENT - 10/27/17 1112      General Information   Behavior/Cognition  Alert;Cooperative;Pleasant mood      Treatment Provided   Treatment provided  Cognitive-Linquistic      Cognitive-Linquistic Treatment   Treatment focused on  Dysarthria    Skilled Treatment  Pt told SLP that reading out loud is a good task to complete for him to transfer to conversation. Pt stated he wondered how speech would become habitually slow - SLP asked pt what he thought, practicing over and over would make it a habit. SLP endorsed that was the case. SLP refocused pt's long term goal of having pt not ask him at all to repeat or slow down - SLP suggested a short term goal of x2/day, then work to x1-2 times per week. SLP reminded pt that he needed to practice slower speech for homework for it to become habit. Pt  read 2-5 sentence paragraphs x5 with rare min A for slowed rate, fading to independent. In sentence responses, pt maintained slowed rate with rare min A for reduced rate. SLP encouraged pt to practice at home for habituating slower rate.      Assessment / Recommendations / Plan   Plan  Continue with current plan of care      Progression Toward Goals   Progression toward goals  Progressing toward goals         SLP Short Term Goals - 10/27/17 1731      SLP SHORT TERM GOAL #1   Title  pt will produce 19/20 sentences with reduced speech rate over 3 sessions    Baseline  10/19/17;10-27-17    Time  2    Period  Weeks    Status  On-going      SLP SHORT TERM GOAL #2   Title  pt will read functionally for one minute using slowed speech rate    Time  2    Period  Weeks    Status  On-going      SLP SHORT TERM GOAL #3   Title  pt will demo slower speech producing >95% intelligible speech in 7 minutes simple  conversation outside ST room    Time  2    Period  Weeks    Status  On-going       SLP Long Term Goals - 10/27/17 1732      SLP LONG TERM GOAL #1   Title  pt will demo slower speech producing >95% intelligible speech in 10 minutes simple to mod complex conversation outside ST room    Time  6    Period  Weeks    Status  On-going      SLP LONG TERM GOAL #2   Title  pt will report average no more than 2 requests asking him to repeat daily    Time  6    Period  Weeks    Status  On-going       Plan - 10/27/17 1731    Clinical Impression Statement  Pt required rare SLP A today in structured speech tasks, but is improving his ability for slower speech rate in conversation. He continues to report difficulty communicaitng with his family and friends, continue skilled ST to  maximize intellgibility.    Speech Therapy Frequency  2x / week    Treatment/Interventions  Compensatory techniques;Internal/external aids;SLP instruction and feedback;Patient/family education;Functional  tasks;Cueing hierarchy;Environmental controls    Potential to Achieve Goals  Good       Patient will benefit from skilled therapeutic intervention in order to improve the following deficits and impairments:   Dysarthria and anarthria    Problem List Patient Active Problem List   Diagnosis Date Noted  . Antalgic gait 01/24/2017  . Intercostal muscle tear 07/19/2016  . PD (Parkinson's disease) (HCC) 06/24/2016  . 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 Nonallopathic lesion of thoracic region 01/25/2016  . Nonallopathic lesion-rib cage 01/25/2016  . Nonallopathic lesion of cervical region 01/25/2016  . Parkinson's disease (HCC) 01/25/2016  . Lateral epicondylitis 12/01/2014  . REM behavioral disorder 08/27/2014  . Paralysis agitans (HCC) 10/18/2013    Satoria Dunlop ,MS, CCC-SLP  10/27/2017, 5:32 PM  [facility] [address] [place], [place], [address] Phone: [phone]   Fax:  [phone]   Name: Ian PurserKenneth [name] MRN: [id number] Date of Birth: 14-Nov-1961

## 2017-10-27 NOTE — Patient Instructions (Addendum)
    Spend minimum 2-3 minutes reading out loud - split it up somehow: 4 30-second passages, etc, etc  THEN Spend at least 20-30 minutes per day with structured speech practice (handouts).

## 2017-10-30 ENCOUNTER — Ambulatory Visit: Payer: Medicare Other

## 2017-11-01 ENCOUNTER — Ambulatory Visit: Payer: Medicare Other

## 2017-11-01 ENCOUNTER — Other Ambulatory Visit: Payer: Self-pay

## 2017-11-01 DIAGNOSIS — R471 Dysarthria and anarthria: Secondary | ICD-10-CM

## 2017-11-01 NOTE — Patient Instructions (Addendum)
  Please complete the assigned speech therapy homework prior to your next session and return it to the speech therapist at your next visit.  

## 2017-11-01 NOTE — Therapy (Signed)
[facility] [address] [place], [place], [address] Phone: [phone]   Fax:  [phone]  Speech Language Pathology Treatment  Patient Details  Name: Ian [name] MRN: [id number] Date of Birth: 06-Feb-1961 Referring Provider: Alonza Bogus, DO   Encounter Date: 11/01/2017  End of Session - 11/01/17 1648    Visit Number  7    Number of Visits  17    Date for SLP Re-Evaluation  12/08/17    SLP Start Time  1537 pt 5 minutes late    SLP Stop Time   49    SLP Time Calculation (min)  38 min    Activity Tolerance  Patient tolerated treatment well       Past Medical History:  Diagnosis Date  . History of kidney stones    passed  . PD (Parkinson's disease) ([place]) 09/10/2013  . PONV (postoperative nausea and vomiting)    nausea     Past Surgical History:  Procedure Laterality Date  . COLONOSCOPY    . none    . UPPER GASTROINTESTINAL ENDOSCOPY      There were no vitals filed for this visit.  Subjective Assessment - 11/01/17 1540    Subjective  Pt arrives speaking quickly today.    Currently in Pain?  No/denies            ADULT SLP TREATMENT - 11/01/17 1542      General Information   Behavior/Cognition  Alert;Cooperative;Pleasant mood      Treatment Provided   Treatment provided  Cognitive-Linquistic      Cognitive-Linquistic Treatment   Treatment focused on  Dysarthria    Skilled Treatment  "This is the hard part of the day for me Ian [name]." Pt is tired and time between PD dose is over half gone. SLP used digital recorder to have pt self assess rate of speech. Pt/SLP agreed 90% on sections that oculd have been clearer.  SLP then had pt focus on overarticulation and pt also used external cue of soft foot tapping for reduction of rate. Pt was at least 90% successful in conversation  (simple) wiht this strategy. When pt did not tap foot, approx 75-80% slower rate. Pt to record conversations with him and his  wife to listen to conversation again to self-assess success.      Assessment / Recommendations / Plan   Plan  Continue with current plan of care      Progression Toward Goals   Progression toward goals  Progressing toward goals         SLP Short Term Goals - 11/01/17 1649      SLP SHORT TERM GOAL #1   Title  pt will produce 19/20 sentences with reduced speech rate over 3 sessions    Status  Achieved      SLP SHORT TERM GOAL #2   Title  pt will read functionally for one minute using slowed speech rate    Status  Achieved      SLP SHORT TERM GOAL #3   Title  pt will demo slower speech producing >95% intelligible speech in 7 minutes simple conversation outside [place] room    Status  Partially Met       SLP Long Term Goals - 11/01/17 1650      SLP LONG TERM GOAL #1   Title  pt will demo slower speech producing >95% intelligible speech in 10 minutes simple to mod complex conversation outside [place] room    Time  5    Period  Weeks    Status  On-going      SLP LONG TERM GOAL #2   Title  pt will report average no more than 2 requests asking him to repeat daily    Time  5    Period  Weeks    Status  On-going       Plan - 11/01/17 1648    Clinical Impression Statement  Pt achieving slower speech rate in simple conversation using external cues. He continues to practice with reading and conversations at home. Continued skilled ST to  maximize intellgibility is warranted.    Speech Therapy Frequency  2x / week    Treatment/Interventions  Compensatory techniques;Internal/external aids;SLP instruction and feedback;Patient/family education;Functional tasks;Cueing hierarchy;Environmental controls    Potential to Achieve Goals  Good       Patient will benefit from skilled therapeutic intervention in order to improve the following deficits and impairments:   Dysarthria and anarthria    Problem List Patient Active Problem List   Diagnosis Date Noted  . Antalgic gait 01/24/2017  .  Intercostal muscle tear 07/19/2016  . PD (Parkinson's disease) ([place]) 06/24/2016  . 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 Nonallopathic lesion of thoracic region 01/25/2016  . Nonallopathic lesion-rib cage 01/25/2016  . Nonallopathic lesion of cervical region 01/25/2016  . Parkinson's disease ([place]) 01/25/2016  . Lateral epicondylitis 12/01/2014  . REM behavioral disorder 08/27/2014  . Paralysis agitans ([place]) 10/18/2013    [place] ,[place], [place]  11/01/2017, 4:50 PM  [facility] [address] [place], [place], [address] Phone: [phone]   Fax:  [phone]   Name: Ian [name] MRN: [id number] Date of Birth: June 10, 1961

## 2017-11-07 ENCOUNTER — Other Ambulatory Visit: Payer: Self-pay

## 2017-11-07 ENCOUNTER — Encounter: Payer: Self-pay | Admitting: Speech Pathology

## 2017-11-07 ENCOUNTER — Ambulatory Visit: Payer: Medicare Other | Admitting: Speech Pathology

## 2017-11-07 DIAGNOSIS — R471 Dysarthria and anarthria: Secondary | ICD-10-CM | POA: Diagnosis not present

## 2017-11-07 NOTE — Therapy (Signed)
[facility] [address] [place], [place], [address] Phone: [phone]   Fax:  [phone]  Speech Language Pathology Treatment  Patient Details  Name: Ian [name] MRN: [id number] Date of Birth: May 10, 1961 Referring Provider: Alonza Bogus, DO   Encounter Date: 11/07/2017  End of Session - 11/07/17 1403    Visit Number  8    Number of Visits  17    Date for SLP Re-Evaluation  12/08/17    SLP Start Time  1320    SLP Stop Time   1400    SLP Time Calculation (min)  40 min    Activity Tolerance  Patient tolerated treatment well       Past Medical History:  Diagnosis Date  . History of kidney stones    passed  . PD (Parkinson's disease) ([facility]) 09/10/2013  . PONV (postoperative nausea and vomiting)    nausea     Past Surgical History:  Procedure Laterality Date  . COLONOSCOPY    . MINOR PLACEMENT OF FIDUCIAL N/A 01/31/2017   Procedure: MINOR PLACEMENT OF FIDUCIAL;  Surgeon: Erline Levine, MD;  Location: [facility];  Service: Neurosurgery;  Laterality: N/A;  Fiducial placement  . none    . PULSE GENERATOR IMPLANT Right 02/17/2017   Procedure: Right implantable pulse generator placement;  Surgeon: Erline Levine, MD;  Location: [facility];  Service: Neurosurgery;  Laterality: Right;  Bilateral implantable pulse generator placement  . SUBTHALAMIC STIMULATOR INSERTION Bilateral 02/10/2017   Procedure: Bilateral Deep brain stimulator placement;  Surgeon: Erline Levine, MD;  Location: [facility];  Service: Neurosurgery;  Laterality: Bilateral;  Bilateral Deep brain stimulator placement  . UPPER GASTROINTESTINAL ENDOSCOPY      There were no vitals filed for this visit.  Subjective Assessment - 11/07/17 1323    Subjective  Pt arrived 5 min late for appointment - "I didn't do much homework to be honest with you"    Currently in Pain?  Yes    Pain Score  5     Pain Location  Shoulder    Pain Orientation  Right    Pain Descriptors /  Indicators  Aching    Pain Type  Chronic pain    Pain Onset  More than a month ago    Pain Frequency  Occasional            ADULT SLP TREATMENT - 11/07/17 1327      General Information   Behavior/Cognition  Alert;Cooperative;Pleasant mood      Treatment Provided   Treatment provided  Cognitive-Linquistic      Cognitive-Linquistic Treatment   Treatment focused on  Dysarthria    Skilled Treatment  Pt did not do recordings with spouse at home. Pt reports some difficulty reducing rate when he had some fatigue after golfing. Oral recording of simple conversation Recorded pt in simple oral reading and conversation. Pt reports feeling like he is talking too slow, however when we played his recording, he stated his rate sounded normal. Pt verbalized awareness of how is perception does not match his actual performance. Also noted with pt that as his rate becomes to fast, he begins to stammer or stumble over his words. This is a cue that he needs to slow his rate. Pt utilized slow rate with min A in conversation today.       Assessment / Recommendations / Plan   Plan  Continue with current plan of care       SLP Education - 11/07/17  1400    Education provided  Yes    Education Details  continue to record slow rate practice and compare the rate you hear vs your perception of your rate as you speak    Person(s) Educated  Patient    Methods  Explanation;Demonstration;Handout;Verbal cues    Comprehension  Verbalized understanding;Returned demonstration       SLP Short Term Goals - 11/07/17 1402      SLP SHORT TERM GOAL #1   Title  pt will produce 19/20 sentences with reduced speech rate over 3 sessions    Status  Achieved      SLP SHORT TERM GOAL #2   Title  pt will read functionally for one minute using slowed speech rate    Status  Achieved      SLP SHORT TERM GOAL #3   Title  pt will demo slower speech producing >95% intelligible speech in 7 minutes simple conversation outside  [place] room    Status  Partially Met       SLP Long Term Goals - 11/07/17 [place] #1   Title  pt will demo slower speech producing >95% intelligible speech in 10 minutes simple to mod complex conversation outside [place] room    Time  4    Period  Weeks    Status  On-going      SLP LONG TERM GOAL #2   Title  pt will report average no more than 2 requests asking him to repeat daily    Time  4    Period  Weeks    Status  On-going       Plan - 11/07/17 1401    Clinical Impression Statement  Pt achieving slower speech rate in simple conversation using external cues. He continues to practice with reading and conversations at home. Recording conversation assisted pt in realizing though he feels he is talking too slow, his perception changes as he listens to his recording.  Continued skilled ST to  maximize intellgibility is warranted.    Speech Therapy Frequency  2x / week    Treatment/Interventions  Compensatory techniques;Internal/external aids;SLP instruction and feedback;Patient/family education;Functional tasks;Cueing hierarchy;Environmental controls    Potential to Achieve Goals  Good       Patient will benefit from skilled therapeutic intervention in order to improve the following deficits and impairments:   Dysarthria and anarthria    Problem List Patient Active Problem List   Diagnosis Date Noted  . Antalgic gait 01/24/2017  . Intercostal muscle tear 07/19/2016  . PD (Parkinson's disease) ([facility]) 06/24/2016  . 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 Nonallopathic lesion of thoracic region 01/25/2016  . Nonallopathic lesion-rib cage 01/25/2016  . Nonallopathic lesion of cervical region 01/25/2016  . Parkinson's disease ([facility]) 01/25/2016  . Lateral epicondylitis 12/01/2014  . REM behavioral disorder 08/27/2014  . Paralysis agitans ([facility]) 10/18/2013    Lovvorn, Annye Rusk MS, CCC-SLP 11/07/2017, 2:03 PM  [facility] [address] [place], [place], [address] Phone: [phone]   Fax:  [phone]   Name: Graciano Batson MRN: [id number] Date of Birth: 1961/12/13

## 2017-11-07 NOTE — Patient Instructions (Signed)
  You are perceiving your speech to be very slow when you talk, however, when you hear the recording, it sounds more like a normal rate of speech  When you are making an effort to be slow, you may feel some of your personality is not coming through and it feels un-natural - that is OK  Be aware that you look good, others may not realize when you are tired or fatigued  Record your conversations with your wife - listen to when you feel you are talking too slow - see if it does sound normal on the recording.  Sometimes it helps to have your wife slow her rate also.   You are never going to talk too slow  When you hear yourself stammer or stumble over your words, this is a clue that you are talking too fast   Continue to practice foot tapping when you are feeling too fast

## 2017-11-13 ENCOUNTER — Encounter: Payer: Medicare Other | Admitting: Speech Pathology

## 2017-11-15 ENCOUNTER — Ambulatory Visit: Payer: Medicare Other

## 2017-11-15 ENCOUNTER — Other Ambulatory Visit: Payer: Self-pay

## 2017-11-15 DIAGNOSIS — R471 Dysarthria and anarthria: Secondary | ICD-10-CM | POA: Diagnosis not present

## 2017-11-15 NOTE — Therapy (Signed)
[facility] [address] [place], [place], [address] Phone: [phone]   Fax:  [phone]  Speech Language Pathology Treatment  Patient Details  Name: Ian [name] MRN: [id number] Date of Birth: 1961-04-09 Referring Provider: Alonza Bogus, DO   Encounter Date: 11/15/2017  End of Session - 11/15/17 1422    Visit Number  9    Number of Visits  17    Date for SLP Re-Evaluation  12/08/17    SLP Start Time  1320    SLP Stop Time   1400    SLP Time Calculation (min)  40 min    Activity Tolerance  Patient tolerated treatment well       Past Medical History:  Diagnosis Date  . History of kidney stones    passed  . PD (Parkinson's disease) ([facility]) 09/10/2013  . PONV (postoperative nausea and vomiting)    nausea     Past Surgical History:  Procedure Laterality Date  . COLONOSCOPY    . MINOR PLACEMENT OF FIDUCIAL N/A 01/31/2017   Procedure: MINOR PLACEMENT OF FIDUCIAL;  Surgeon: Erline Levine, MD;  Location: [facility];  Service: Neurosurgery;  Laterality: N/A;  Fiducial placement  . none    . PULSE GENERATOR IMPLANT Right 02/17/2017   Procedure: Right implantable pulse generator placement;  Surgeon: Erline Levine, MD;  Location: [facility];  Service: Neurosurgery;  Laterality: Right;  Bilateral implantable pulse generator placement  . SUBTHALAMIC STIMULATOR INSERTION Bilateral 02/10/2017   Procedure: Bilateral Deep brain stimulator placement;  Surgeon: Erline Levine, MD;  Location: [facility];  Service: Neurosurgery;  Laterality: Bilateral;  Bilateral Deep brain stimulator placement  . UPPER GASTROINTESTINAL ENDOSCOPY      There were no vitals filed for this visit.  Subjective Assessment - 11/15/17 1322    Subjective  "My dad fell on Monday, so I've been busy with that."    Currently in Pain?  Yes    Pain Score  4     Pain Location  Shoulder    Pain Orientation  Right    Pain Descriptors / Indicators  Aching    Pain Onset  More  than a month ago    Pain Frequency  Occasional    Aggravating Factors   using it strenuously    Pain Relieving Factors  ice, heat, resting it            ADULT SLP TREATMENT - 11/15/17 1334      General Information   Behavior/Cognition  Alert;Cooperative;Pleasant mood      Treatment Provided   Treatment provided  Cognitive-Linquistic      Cognitive-Linquistic Treatment   Treatment focused on  Dysarthria    Skilled Treatment  Pt with little work on slower speech since last session, however reports requests for repeat are 1-2 per day at most. Pt's rate with SLP today is generally slower than this SLPs previous session with pt. SLP targeted speech rate in 90 second-2 minute conversations re: myriad of topics and had pt self-rate ability to reduce rate. He and SLP agreed with ratings 80% of the time. Pt and SLP believe pt could reduce frequency to once/week at this time due to progress.      Assessment / Recommendations / Plan   Plan  -- reduce to once/week due to progress      Progression Toward Goals   Progression toward goals  Progressing toward goals         SLP Short Term Goals - 11/15/17  [place] #1   Title  pt will produce 19/20 sentences with reduced speech rate over 3 sessions    Status  Achieved      SLP SHORT TERM GOAL #2   Title  pt will read functionally for one minute using slowed speech rate    Status  Achieved      SLP SHORT TERM GOAL #3   Title  pt will demo slower speech producing >95% intelligible speech in 7 minutes simple conversation outside ST room    Status  Partially Met       SLP Long Term Goals - 11/15/17 [place] #1   Title  pt will demo slower speech producing >95% intelligible speech in 10 minutes simple to mod complex conversation outside [place] room    Time  3    Period  Weeks    Status  On-going      SLP LONG TERM GOAL #2   Title  pt will report average no more than 2 requests asking him to  repeat daily    Status  Achieved       Plan - 11/15/17 1422    Clinical Impression Statement  Pt achieving slower speech rate in simple conversation of 90 seconds to 2 minutes 60-80% of the time. He continues to practice with reading and conversations at home. Continued skilled ST to  maximize intellgibility is warranted.    Speech Therapy Frequency  2x / week    Treatment/Interventions  Compensatory techniques;Internal/external aids;SLP instruction and feedback;Patient/family education;Functional tasks;Cueing hierarchy;Environmental controls    Potential to Achieve Goals  Good       Patient will benefit from skilled therapeutic intervention in order to improve the following deficits and impairments:   Dysarthria and anarthria    Problem List Patient Active Problem List   Diagnosis Date Noted  . Antalgic gait 01/24/2017  . Intercostal muscle tear 07/19/2016  . PD (Parkinson's disease) ([facility]) 06/24/2016  . 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 Nonallopathic lesion of thoracic region 01/25/2016  . Nonallopathic lesion-rib cage 01/25/2016  . Nonallopathic lesion of cervical region 01/25/2016  . Parkinson's disease ([facility]) 01/25/2016  . Lateral epicondylitis 12/01/2014  . REM behavioral disorder 08/27/2014  . Paralysis agitans ([facility]) 10/18/2013    [place] ,[place], [place]  11/15/2017, 2:25 PM  [facility] [address] [place], [place], [address] Phone: [phone]   Fax:  [phone]   Name: Ian [name] MRN: [id number] Date of Birth: 08/16/1961

## 2017-11-15 NOTE — Patient Instructions (Addendum)
Practice with your wife - 3-4 short conversations per day as we did today - record - listen back.

## 2017-11-21 ENCOUNTER — Ambulatory Visit: Payer: Medicare Other | Admitting: Family Medicine

## 2017-11-27 ENCOUNTER — Ambulatory Visit: Payer: Medicare Other

## 2017-11-30 ENCOUNTER — Ambulatory Visit: Payer: Medicare Other | Admitting: Family Medicine

## 2017-12-05 ENCOUNTER — Other Ambulatory Visit: Payer: Self-pay

## 2017-12-05 ENCOUNTER — Ambulatory Visit: Payer: Medicare Other | Attending: Neurology

## 2017-12-05 DIAGNOSIS — R471 Dysarthria and anarthria: Secondary | ICD-10-CM

## 2017-12-05 NOTE — Progress Notes (Signed)
Ian ScaleZach [name] D.O. [facility] [address] [place], [place] [address] Phone: [phone] Subjective:    I'm seeing this patient by the request  of:    CC: Right shoulder pain and back pain follow-up  BJY:[id number]:Subjective  Ian PurserKenneth [name] is a 56 y.o. male coming in for right shoulder pain. He has been having pain for approximately 1 month ago. He notes pain on the superior aspect of his shoulder. Denies any clicking or popping with movement. Denies any radiating symptoms.   Patient back pain seems to be a little bit tighter as well.  Patient has been traveling.  Been under some stress with the holidays as well as his twin daughters graduating.     Past Medical History:  Diagnosis Date  . History of kidney stones    passed  . PD (Parkinson's disease) ([facility]) 09/10/2013  . PONV (postoperative nausea and vomiting)    nausea    Past Surgical History:  Procedure Laterality Date  . COLONOSCOPY    . MINOR PLACEMENT OF FIDUCIAL N/A 01/31/2017   Procedure: MINOR PLACEMENT OF FIDUCIAL;  Surgeon: [name], MD;  [facility];  Service: Neurosurgery;  Laterality: N/A;  Fiducial placement  . none    . PULSE GENERATOR IMPLANT Right 02/17/2017   Procedure: Right implantable pulse generator placement;  Surgeon: [name], MD;  [facility];  Service: Neurosurgery;  Laterality: Right;  Bilateral implantable pulse generator placement  . SUBTHALAMIC STIMULATOR INSERTION Bilateral 02/10/2017   Procedure: Bilateral Deep brain stimulator placement;  Surgeon: [name], MD;  [facility];  Service: Neurosurgery;  Laterality: Bilateral;  Bilateral Deep brain stimulator placement  . UPPER GASTROINTESTINAL ENDOSCOPY     Social History   Socioeconomic History  . Marital status: Married    Spouse name: None  . Number of children: None  . Years of education: None  . Highest education level: None  Social Needs  . Financial resource strain: None  . Food insecurity - worry: None  .  Food insecurity - inability: None  . Transportation needs - medical: None  . Transportation needs - non-medical: None  Occupational History  . None  Tobacco Use  . Smoking status: Never Smoker  . Smokeless tobacco: Never Used  Substance and Sexual Activity  . Alcohol use: Yes    Comment: Occ  . Drug use: No  . Sexual activity: None  Other Topics Concern  . None  Social History Narrative  . None   Allergies  Allergen Reactions  . No Known Allergies    Family History  Problem Relation Age of Onset  . Diabetes Other   . Cancer Other      Past medical history, social, surgical and family history all reviewed in electronic medical record.  No pertanent information unless stated regarding to the chief complaint.   Review of Systems:Review of systems updated and as accurate as of 12/06/17  No headache, visual changes, nausea, vomiting, diarrhea, constipation, dizziness, abdominal pain, skin rash, fevers, chills, night sweats, weight loss, swollen lymph nodes, body aches, joint swelling, chest pain, shortness of breath, mood changes.  Positive muscle aches  Objective  Blood pressure 122/78, pulse 84, weight 171 lb (77.6 kg), SpO2 98 %. Systems examined below as of 12/06/17   General: No apparent distress alert and oriented x3 mood and affect normal, dressed appropriately.  Masked feces noted HEENT: Pupils equal, extraocular movements intact  Respiratory: Patient's speak in full sentences and does not appear short of  breath  Cardiovascular: No lower extremity edema, non tender, no erythema  Skin: Warm dry intact with no signs of infection or rash on extremities or on axial skeleton.  Abdomen: Soft nontender  Neuro: Cranial nerves II through XII are intact, neurovascularly intact in all extremities with 2+ DTRs and 2+ pulses.  Lymph: No lymphadenopathy of posterior or anterior cervical chain or axillae bilaterally.  Gait normal with good balance and coordination.  MSK:  Non  tender with full range of motion and good stability and symmetric strength and tone of , elbows, wrist, hip, knee and ankles bilaterally.  Mild increase in left lower extremity tremor Shoulder: Right Inspection reveals no abnormalities, atrophy or asymmetry. Palpation is normal with no tenderness over AC joint or bicipital groove. ROM is full in all planes passively. Rotator cuff strength normal throughout. signs of impingement with positive Neer and Hawkin's tests, but negative empty can sign. Speeds and Yergason's tests normal. No labral pathology noted with negative Obrien's, negative clunk and good stability. Normal scapular function observed. No painful arc and no drop arm sign. No apprehension sign  MSK US performed of: Right This study was ordered, performed, and interpreted by Terrilee FilesZach [name] D.O.  Shoulder:   Supraspinatus:  Appears normal on long and transverse views, Bursal bulge seen with shoulder abduction on impingement view. Infraspinatus:  Appears normal on long and transverse views. Significant increase in Doppler flow Subscapularis:  Appears normal on long and transverse views. Positive bursa Teres Minor:  Appears normal on long and transverse views. AC joint:  Capsule undistended, no geyser sign. Glenohumeral Joint:  Appears normal without effusion. Glenoid Labrum:  Intact without visualized tears. Biceps Tendon:  Appears normal on long and transverse views, no fraying of tendon, tendon located in intertubercular groove, no subluxation with shoulder internal or external rotation.  Impression: Subacromial bursitis  Procedure: Real-time Ultrasound Guided Injection of right glenohumeral joint Device: GE Logiq E  Ultrasound guided injection is preferred based studies that show increased duration, increased effect, greater accuracy, decreased procedural pain, increased response rate with ultrasound guided versus blind injection.  Verbal informed consent obtained.  Time-out  conducted.  Noted no overlying erythema, induration, or other signs of local infection.  Skin prepped in a sterile fashion.  Local anesthesia: Topical Ethyl chloride.  With sterile technique and under real time ultrasound guidance:  Joint visualized.  23g 1  inch needle inserted posterior approach. Pictures taken for needle placement. Patient did have injection of 2 cc of 1% lidocaine, 2 cc of 0.5% Marcaine, and 1.0 cc of Kenalog 40 mg/dL. Completed without difficulty  Pain immediately resolved suggesting accurate placement of the medication.  Advised to call if fevers/chills, erythema, induration, drainage, or persistent bleeding.  Images permanently stored and available for review in the ultrasound unit.  Impression: Technically successful ultrasound guided injection.  Osteopathic findings C2 flexed rotated and side bent right C4 flexed rotated and side bent left C7 flexed rotated and side bent left T3 extended rotated and side bent right inhaled third rib T6 extended rotated and side bent left L2 flexed rotated and side bent right Sacrum right on right    Impression and Recommendations:     This case required medical decision making of moderate complexity.      Note: This dictation was prepared with Dragon dictation along with smaller phrase technology. Any transcriptional errors that result from this process are unintentional.

## 2017-12-06 ENCOUNTER — Encounter: Payer: Self-pay | Admitting: Family Medicine

## 2017-12-06 ENCOUNTER — Ambulatory Visit: Payer: Self-pay

## 2017-12-06 ENCOUNTER — Ambulatory Visit (INDEPENDENT_AMBULATORY_CARE_PROVIDER_SITE_OTHER): Payer: Medicare Other | Admitting: Family Medicine

## 2017-12-06 VITALS — BP 122/78 | HR 84 | Wt 171.0 lb

## 2017-12-06 DIAGNOSIS — M7551 Bursitis of right shoulder: Secondary | ICD-10-CM

## 2017-12-06 DIAGNOSIS — M999 Biomechanical lesion, unspecified: Secondary | ICD-10-CM

## 2017-12-06 DIAGNOSIS — M9982 Other biomechanical lesions of thoracic region: Secondary | ICD-10-CM | POA: Diagnosis not present

## 2017-12-06 DIAGNOSIS — M25511 Pain in right shoulder: Secondary | ICD-10-CM

## 2017-12-06 DIAGNOSIS — M940 Chondrocostal junction syndrome [Tietze]: Secondary | ICD-10-CM | POA: Diagnosis not present

## 2017-12-06 NOTE — Assessment & Plan Note (Signed)
Patient given injection and tolerated the procedure well.  More of a bursitis.  Topical anti-inflammatories given.  Discussed proper lifting mechanics.  Following up again in 4 weeks to make sure patient is improving.

## 2017-12-06 NOTE — Patient Instructions (Signed)
Good to see you  We will watch the shoulder Exercises 3 times a week.   Keep hands within peripheral vision pennsaid pinkie amount topically 2 times daily as needed.   See me again in 3-4 weeks  Happy New Year!

## 2017-12-06 NOTE — Assessment & Plan Note (Signed)
Increasing stiffness noted today.  Patient also had an increase in the resting tremor.  Patient is being seen by neurology in the near future for potential changing of the frequency of the brain stimulator.  We discussed icing regimen and home exercises. Follow-up again 4 weeks

## 2017-12-06 NOTE — Therapy (Signed)
[facility] [address] [place], [place], [address] Phone: [phone]   Fax:  [phone]  Speech Language Pathology Treatment  Patient Details  Name: Ian [name] MRN: [id number] Date of Birth: 1961-10-27 Referring Provider: Alonza Bogus, DO   Encounter Date: 12/05/2017  End of Session - 12/06/17 1716    Visit Number  10    Number of Visits  18    Date for SLP Re-Evaluation  01/18/18    SLP Start Time  1410 pt late    SLP Stop Time   1446    SLP Time Calculation (min)  36 min    Activity Tolerance  Patient tolerated treatment well       Past Medical History:  Diagnosis Date  . History of kidney stones    passed  . PD (Parkinson's disease) ([facility]) 09/10/2013  . PONV (postoperative nausea and vomiting)    nausea     Past Surgical History:  Procedure Laterality Date  . COLONOSCOPY    . MINOR PLACEMENT OF FIDUCIAL N/A 01/31/2017   Procedure: MINOR PLACEMENT OF FIDUCIAL;  Surgeon: Erline Levine, MD;  Location: [facility];  Service: Neurosurgery;  Laterality: N/A;  Fiducial placement  . none    . PULSE GENERATOR IMPLANT Right 02/17/2017   Procedure: Right implantable pulse generator placement;  Surgeon: Erline Levine, MD;  Location: [facility];  Service: Neurosurgery;  Laterality: Right;  Bilateral implantable pulse generator placement  . SUBTHALAMIC STIMULATOR INSERTION Bilateral 02/10/2017   Procedure: Bilateral Deep brain stimulator placement;  Surgeon: Erline Levine, MD;  Location: [facility];  Service: Neurosurgery;  Laterality: Bilateral;  Bilateral Deep brain stimulator placement  . UPPER GASTROINTESTINAL ENDOSCOPY      There were no vitals filed for this visit.  Subjective Assessment - 12/05/17 1415    Subjective  "I cut out a whole dose (of medication)." SLP encouraged pt contact Dr. Carles Collet to inform/confirm this is ok.    Currently in Pain?  Yes    Pain Score  2     Pain Location  Shoulder    Pain Orientation  Right     Pain Descriptors / Indicators  Aching    Pain Type  Chronic pain    Pain Onset  More than a month ago    Pain Frequency  Occasional    Aggravating Factors   strenuous use    Pain Relieving Factors  ice, heat, resting it            ADULT SLP TREATMENT - 12/06/17 0001      General Information   Behavior/Cognition  Alert;Cooperative;Pleasant mood      Treatment Provided   Treatment provided  Cognitive-Linquistic      Cognitive-Linquistic Treatment   Treatment focused on  Dysarthria    Skilled Treatment  "It's been a whirlwind - the last two weeks." Pt entered room with intermittent fast rate; he admits to not doing homework and he thinks he has gotten out of the habit of WNL speech rate.   SLP faciliatated pt's slower speech rate by having pt read 5-8 sentence paragraphs. Pt req'd rare min-mod A for slowed rate. In conversation (simple) pt req'd mod A occasionally for reducing rate of speech. SLP told pt to practice with reading aloud and talking with his wife with replay for pt review of success.       Assessment / Recommendations / Plan   Plan  Continue with current plan of care  Progression Toward Goals   Progression toward goals  Not progressing toward goals (comment) pt worse today than previous session         SLP Short Term Goals - 11/15/17 [place] #1   Title  pt will produce 19/20 sentences with reduced speech rate over 3 sessions    Status  Achieved      SLP SHORT TERM GOAL #2   Title  pt will read functionally for one minute using slowed speech rate    Status  Achieved      SLP SHORT TERM GOAL #3   Title  pt will demo slower speech producing >95% intelligible speech in 7 minutes simple conversation outside ST room    Status  Partially Met       SLP Long Term Goals - December 17, 2017 1719      SLP LONG TERM GOAL #1   Title  pt will demo slower speech producing >95% intelligible speech in 10 minutes simple to mod complex conversation  outside [place] room x3 sessions    Time  4  LTG renewed 12-05-17    Period  Weeks    Status  Revised and ongoing      SLP LONG TERM GOAL #2   Title  pt will report average no more than 2 requests asking him to repeat daily    Status  Achieved       Plan - 2017-12-17 1717    Clinical Impression Statement  Pt required SLP A to reduce speech rate today in structured speech tasks, and appears to have reduced ability to slow speech rate in conversation. He endorses not practicing slower rate for approx one week and did not complete homework since his last visit. Pt has only been seen for 10 visits in 8 weeks, and cont'd skilled ST necessary to maximize intellgibility.    Speech Therapy Frequency  2x / week    Duration  4 weeks 8 more visits (18 total visits)    Treatment/Interventions  Compensatory techniques;Internal/external aids;SLP instruction and feedback;Patient/family education;Functional tasks;Cueing hierarchy;Environmental controls    Potential to Achieve Goals  Good       Patient will benefit from skilled therapeutic intervention in order to improve the following deficits and impairments:   Dysarthria and anarthria  G-Codes - 12/17/2017 1721    Functional Assessment Tool Used  noms    Functional Limitations  Motor speech    Motor Speech Current Status [phone])  At least 20 percent but less than 40 percent impaired, limited or restricted    Motor Speech Goal Status (O8325)  At least 1 percent but less than 20 percent impaired, limited or restricted      Speech Therapy Progress Note  Dates of Reporting Period: 09/18/17 to 12-05-17   Subjective Statement: Pt seen for 10 ST visits targeting improved speech intelligibilty.  Objective Measurements: See above.  Goal Update: See LTGs above. Pt was on track to meet LTGs in approx 2-4 more visits however was unavailable for therapy for approx 3 weeks and has reverted to using faster rate of speech requiring SLP cues in structured tasks  again.  Plan: Cont to see pt for 8 more sessions  Reason Skilled Services are Required: Pt requires cont'd SLP cues for improving intelligibility in conversation.   Problem List Patient Active Problem List   Diagnosis Date Noted  . Acute bursitis of right shoulder 17-Dec-2017  . Antalgic gait 01/24/2017  . Intercostal muscle  tear 07/19/2016  . PD (Parkinson's disease) ([facility]) 06/24/2016  . 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 Nonallopathic lesion of thoracic region 01/25/2016  . Nonallopathic lesion-rib cage 01/25/2016  . Nonallopathic lesion of cervical region 01/25/2016  . Parkinson's disease ([facility]) 01/25/2016  . Lateral epicondylitis 12/01/2014  . REM behavioral disorder 08/27/2014  . Paralysis agitans ([facility]) 10/18/2013    [place] ,[place], [place]  12/06/2017, 5:21 PM  [facility] [address] [place], [place], [address] Phone: [phone]   Fax:  [phone]   Name: Ian [name] MRN: [id number] Date of Birth: 03/02/1961

## 2017-12-06 NOTE — Assessment & Plan Note (Signed)
Decision today to treat with OMT was based on Physical Exam  After verbal consent patient was treated with HVLA, ME, FPR techniques in cervical, thoracic, rib, lumbar and sacral areas  Patient tolerated the procedure well with improvement in symptoms  Patient given exercises, stretches and lifestyle modifications  See medications in patient instructions if given  Patient will follow up in 3-4 weeks 

## 2017-12-21 ENCOUNTER — Other Ambulatory Visit: Payer: Self-pay

## 2017-12-21 ENCOUNTER — Ambulatory Visit: Payer: Medicare Other | Attending: Neurology

## 2017-12-21 DIAGNOSIS — R471 Dysarthria and anarthria: Secondary | ICD-10-CM | POA: Insufficient documentation

## 2017-12-21 NOTE — Patient Instructions (Signed)
REad for approximately 2-3 minutes, then practice the slower speech rate with the worksheets for the time choices we discussed today.

## 2017-12-21 NOTE — Therapy (Signed)
[facility] [address] [place], [place], [address] Phone: [phone]   Fax:  [phone]  Speech Language Pathology Treatment  Patient Details  Name: Saxon Barich MRN: [id number] Date of Birth: September 03, 1961 Referring Provider: Alonza Bogus, DO   Encounter Date: 12/21/2017  End of Session - 12/21/17 1713    Visit Number  11    Number of Visits  18    Date for SLP Re-Evaluation  01/18/18    SLP Start Time  1535 4 minutes late    SLP Stop Time   1615    SLP Time Calculation (min)  40 min       Past Medical History:  Diagnosis Date  . History of kidney stones    passed  . PD (Parkinson's disease) ([facility]) 09/10/2013  . PONV (postoperative nausea and vomiting)    nausea     Past Surgical History:  Procedure Laterality Date  . COLONOSCOPY    . MINOR PLACEMENT OF FIDUCIAL N/A 01/31/2017   Procedure: MINOR PLACEMENT OF FIDUCIAL;  Surgeon: Erline Levine, MD;  Location: [facility];  Service: Neurosurgery;  Laterality: N/A;  Fiducial placement  . none    . PULSE GENERATOR IMPLANT Right 02/17/2017   Procedure: Right implantable pulse generator placement;  Surgeon: Erline Levine, MD;  Location: [facility];  Service: Neurosurgery;  Laterality: Right;  Bilateral implantable pulse generator placement  . SUBTHALAMIC STIMULATOR INSERTION Bilateral 02/10/2017   Procedure: Bilateral Deep brain stimulator placement;  Surgeon: Erline Levine, MD;  Location: [facility];  Service: Neurosurgery;  Laterality: Bilateral;  Bilateral Deep brain stimulator placement  . UPPER GASTROINTESTINAL ENDOSCOPY      There were no vitals filed for this visit.  Subjective Assessment - 12/21/17 1543    Subjective  "I took my (PD) medication about two minutes ago." Pt has not informed Dr. Carles Collet of eliminating one dosage of PD meds.    Currently in Pain?  No/denies            ADULT SLP TREATMENT - 12/21/17 1544      General Information   Behavior/Cognition   Alert;Cooperative;Pleasant mood      Treatment Provided   Treatment provided  Cognitive-Linquistic      Cognitive-Linquistic Treatment   Treatment focused on  Dysarthria    Skilled Treatment  "I'm making a little bit of progress (with habitualizing slower speech) day by day." Pt has not done homework in last 2 weeks. SLP told pt he must complete homework for 25 minutes x5/week or 2 15- minute periods, 5 times a week. Pt explained to SLP that he was practicing more "in a natural way" (in conversation) than with structured tasks it will be a longer process for habituation of slower rate of speech. In 2-3 sentence responses pt was approx 80% successful with slowed speech rate and was approx 95% intelligible. SLP stressed pt must practice using structured tasks in order to have speech rate reduction carry over to conversational speech.      Assessment / Recommendations / Plan   Plan  Continue with current plan of care      Progression Toward Goals   Progression toward goals  Progressing toward goals       SLP Education - 12/21/17 1713    Education provided  Yes    Education Details  need to practice structured tasks for carryover    Person(s) Educated  Patient    Methods  Explanation    Comprehension  Verbalized understanding  SLP Short Term Goals - 11/15/17 1423      SLP SHORT TERM GOAL #1   Title  pt will produce 19/20 sentences with reduced speech rate over 3 sessions    Status  Achieved      SLP SHORT TERM GOAL #2   Title  pt will read functionally for one minute using slowed speech rate    Status  Achieved      SLP SHORT TERM GOAL #3   Title  pt will demo slower speech producing >95% intelligible speech in 7 minutes simple conversation outside [place] room    Status  Partially Met       SLP Long Term Goals - 12/21/17 1715      SLP LONG TERM GOAL #1   Title  pt will demo slower speech producing >95% intelligible speech in 10 minutes simple to mod complex conversation  outside [place] room x3 sessions    Time  4  LTG renewed 12-05-17    Period  Weeks    Status  On-going and ongoing      SLP LONG TERM GOAL #2   Title  pt will report average no more than 2 requests asking him to repeat daily    Status  Achieved       Plan - 12/21/17 1714    Clinical Impression Statement  Pt achieving approx slower speech rate in simple conversation approx 80% of the time. He continues to practice with reading and conversations at home. SLP stressed need for home practice with structured speech tasks. Continued skilled ST to  maximize intellgibility is warranted.    Speech Therapy Frequency  2x / week    Treatment/Interventions  Compensatory techniques;Internal/external aids;SLP instruction and feedback;Patient/family education;Functional tasks;Cueing hierarchy;Environmental controls    Potential to Achieve Goals  Good       Patient will benefit from skilled therapeutic intervention in order to improve the following deficits and impairments:   Dysarthria and anarthria    Problem List Patient Active Problem List   Diagnosis Date Noted  . Acute bursitis of right shoulder 12/06/2017  . Antalgic gait 01/24/2017  . Intercostal muscle tear 07/19/2016  . PD (Parkinson's disease) ([facility]) 06/24/2016  . 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 Nonallopathic lesion of thoracic region 01/25/2016  . Nonallopathic lesion-rib cage 01/25/2016  . Nonallopathic lesion of cervical region 01/25/2016  . Parkinson's disease ([facility]) 01/25/2016  . Lateral epicondylitis 12/01/2014  . REM behavioral disorder 08/27/2014  . Paralysis agitans ([facility]) 10/18/2013    SCHIN[place],CARL ,[place], [place]  12/21/2017, 5:16 PM  [facility] [address] [place], [place], [address] Phone: [phone]   Fax:  [phone]   Name: Ervey Fallin MRN: [id number] Date of Birth: 02-11-1961

## 2017-12-27 ENCOUNTER — Ambulatory Visit: Payer: Medicare Other

## 2017-12-27 ENCOUNTER — Other Ambulatory Visit: Payer: Self-pay

## 2017-12-27 DIAGNOSIS — R471 Dysarthria and anarthria: Secondary | ICD-10-CM

## 2017-12-27 NOTE — Patient Instructions (Signed)
You must practice slowing your rate of speech outside the ST room, routinely.

## 2017-12-27 NOTE — Therapy (Signed)
[facility] [address] [place], [place], [address] Phone: [phone]   Fax:  [phone]  Speech Language Pathology Treatment  Patient Details  Name: Ian [name] MRN: [id number] Date of Birth: [birth year] Referring Provider: Alonza Bogus, DO   Encounter Date: 12/27/2017  End of Session - 12/27/17 1659    Visit Number  12    Number of Visits  18    Date for SLP Re-Evaluation  01/18/18    SLP Start Time  6629    SLP Stop Time   1616    SLP Time Calculation (min)  41 min    Activity Tolerance  Patient tolerated treatment well       Past Medical History:  Diagnosis Date  . History of kidney stones    passed  . PD (Parkinson's disease) ([facility]) 09/10/2013  . PONV (postoperative nausea and vomiting)    nausea     Past Surgical History:  Procedure Laterality Date  . COLONOSCOPY    . MINOR PLACEMENT OF FIDUCIAL N/A 01/31/2017   Procedure: MINOR PLACEMENT OF FIDUCIAL;  Surgeon: Erline Levine, MD;  Location: [facility];  Service: Neurosurgery;  Laterality: N/A;  Fiducial placement  . none    . PULSE GENERATOR IMPLANT Right 02/17/2017   Procedure: Right implantable pulse generator placement;  Surgeon: Erline Levine, MD;  Location: [facility];  Service: Neurosurgery;  Laterality: Right;  Bilateral implantable pulse generator placement  . SUBTHALAMIC STIMULATOR INSERTION Bilateral 02/10/2017   Procedure: Bilateral Deep brain stimulator placement;  Surgeon: Erline Levine, MD;  Location: [facility];  Service: Neurosurgery;  Laterality: Bilateral;  Bilateral Deep brain stimulator placement  . UPPER GASTROINTESTINAL ENDOSCOPY      There were no vitals filed for this visit.  Subjective Assessment - 12/27/17 1540    Subjective  "I've had a breakthrough the last week, but my wife was sick so I was busy taking care of her."    Currently in Pain?  No/denies            ADULT SLP TREATMENT - 12/27/17 1541      General Information   Behavior/Cognition  Alert;Cooperative;Pleasant mood      Treatment Provided   Treatment provided  Cognitive-Linquistic      Cognitive-Linquistic Treatment   Treatment focused on  Dysarthria    Skilled Treatment  Pt reports wife cont to tell him to reduce rate of speech. Pt brought his 4pm med and feels like he is going "off" at start time of ST. Took med at 1550. Pt read 2-3 paragraph selsections and maintained slower rate 80% of the time, wihtout notable rushes of speech. In 12 minutes conversation split up into 4 pt-rated sections outside ST room, pt maintained slower rate 65% of the time.       Assessment / Recommendations / Plan   Plan  Continue with current plan of care      Progression Toward Goals   Progression toward goals  Progressing toward goals         SLP Short Term Goals - 11/15/17 1423      SLP SHORT TERM GOAL #1   Title  pt will produce 19/20 sentences with reduced speech rate over 3 sessions    Status  Achieved      SLP SHORT TERM GOAL #2   Title  pt will read functionally for one minute using slowed speech rate    Status  Achieved      SLP SHORT TERM GOAL #  3   Title  pt will demo slower speech producing >95% intelligible speech in 7 minutes simple conversation outside ST room    Status  Partially Met       SLP Long Term Goals - 12/27/17 [place] #1   Title  pt will demo slower speech producing >95% intelligible speech in 10 minutes simple to mod complex conversation outside [place] room x3 sessions    Baseline  12-27-17    Time  2  LTG renewed 12-05-17    Period  Weeks    Status  On-going and ongoing      SLP LONG TERM GOAL #2   Title  pt will report average no more than 2 requests asking him to repeat daily    Status  Achieved       Plan - 12/27/17 1659    Clinical Impression Statement  Pt achieving approx slower speech rate in simple conversation approx 70% of the time today, [place] room. He continues to practice with reading and  conversations at home. SLP stressed need for home practice with structured speech tasks as well as in conversation. Continued skilled ST to  maximize intellgibility is warranted.    Speech Therapy Frequency  2x / week    Duration  4 weeks    Treatment/Interventions  Compensatory techniques;Internal/external aids;SLP instruction and feedback;Patient/family education;Functional tasks;Cueing hierarchy;Environmental controls    Potential to Achieve Goals  Good       Patient will benefit from skilled therapeutic intervention in order to improve the following deficits and impairments:   Dysarthria and anarthria    Problem List Patient Active Problem List   Diagnosis Date Noted  . Acute bursitis of right shoulder 12/06/2017  . Antalgic gait 01/24/2017  . Intercostal muscle tear 07/19/2016  . PD (Parkinson's disease) ([facility]) 06/24/2016  . 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 Nonallopathic lesion of thoracic region 01/25/2016  . Nonallopathic lesion-rib cage 01/25/2016  . Nonallopathic lesion of cervical region 01/25/2016  . Parkinson's disease ([facility]) 01/25/2016  . Lateral epicondylitis 12/01/2014  . REM behavioral disorder 08/27/2014  . Paralysis agitans ([facility]) 10/18/2013    [place] ,[place], [place]  12/27/2017, 5:01 PM  [facility] [address] [place], [place], [address] Phone: [phone]   Fax:  [phone]   Name: Donovyn Guidice MRN: [id number] Date of Birth: Nov 18, 1961

## 2018-01-03 ENCOUNTER — Ambulatory Visit
Admission: RE | Admit: 2018-01-03 | Discharge: 2018-01-03 | Disposition: A | Discharge status: Home / Self Care | Payer: Medicare Other | Source: Ambulatory Visit | Attending: Family Medicine | Admitting: Family Medicine

## 2018-01-03 ENCOUNTER — Encounter: Payer: Self-pay | Admitting: Family Medicine

## 2018-01-03 ENCOUNTER — Ambulatory Visit (INDEPENDENT_AMBULATORY_CARE_PROVIDER_SITE_OTHER): Payer: Medicare Other | Admitting: Family Medicine

## 2018-01-03 VITALS — BP 100/64 | HR 64 | Ht 74.0 in | Wt 170.0 lb

## 2018-01-03 DIAGNOSIS — G8929 Other chronic pain: Secondary | ICD-10-CM

## 2018-01-03 DIAGNOSIS — M940 Chondrocostal junction syndrome [Tietze]: Secondary | ICD-10-CM

## 2018-01-03 DIAGNOSIS — M999 Biomechanical lesion, unspecified: Secondary | ICD-10-CM

## 2018-01-03 DIAGNOSIS — M19011 Primary osteoarthritis, right shoulder: Secondary | ICD-10-CM | POA: Diagnosis not present

## 2018-01-03 DIAGNOSIS — M9982 Other biomechanical lesions of thoracic region: Secondary | ICD-10-CM | POA: Diagnosis not present

## 2018-01-03 DIAGNOSIS — M25511 Pain in right shoulder: Secondary | ICD-10-CM

## 2018-01-03 DIAGNOSIS — M19019 Primary osteoarthritis, unspecified shoulder: Secondary | ICD-10-CM | POA: Insufficient documentation

## 2018-01-03 NOTE — Assessment & Plan Note (Signed)
Decision today to treat with OMT was based on Physical Exam  After verbal consent patient was treated with HVLA, ME, FPR techniques in cervical, thoracic, rib, lumbar and sacral areas  Patient tolerated the procedure well with improvement in symptoms  Patient given exercises, stretches and lifestyle modifications  See medications in patient instructions if given  Patient will follow up in 4 weeks 

## 2018-01-03 NOTE — Progress Notes (Signed)
[name] [address] [place], [place] [address] Phone: [phone] Subjective:     CC: Back and neck pain follow-up  BJY:[id number]  Ian [name] is a 57 y.o. male coming in with complaint of back pain.  Patient does have Parkinson's disease but now does have a stimulator placed.  Had been doing better.  Seem to be increasing activity again.  Does respond well to osteopathic manipulation.  Patient states still having some tightness but nothing severe.  Continues to workout on a daily basis.  Feels like this is the most beneficial thing he can do.  Not taking any medications for pain on a regular basis  Patient was also having right shoulder pain.  Seem to be more of a subacromial bursitis.  Given injection and was to do home exercises.  Patient states most of the pain seems to be better.  Seems to have more pain now localized on the top of the shoulder.  Worse with reaching across his body or laying on that side at night.  Sometimes can actually push on the top of the shoulder and cause the pain.       Past Medical History:  Diagnosis Date  . History of kidney stones    passed  . PD (Parkinson's disease) (HCC) 09/10/2013  . PONV (postoperative nausea and vomiting)    nausea    Past Surgical History:  Procedure Laterality Date  . COLONOSCOPY    . MINOR PLACEMENT OF FIDUCIAL N/A 01/31/2017   Procedure: MINOR PLACEMENT OF FIDUCIAL;  Surgeon: Maeola Harman, MD;  Location: [facility] OR;  Service: Neurosurgery;  Laterality: N/A;  Fiducial placement  . none    . PULSE GENERATOR IMPLANT Right 02/17/2017   Procedure: Right implantable pulse generator placement;  Surgeon: Maeola Harman, MD;  Location: [facility] OR;  Service: Neurosurgery;  Laterality: Right;  Bilateral implantable pulse generator placement  . SUBTHALAMIC STIMULATOR INSERTION Bilateral 02/10/2017   Procedure: Bilateral Deep brain stimulator placement;  Surgeon: Maeola Harman, MD;  Location: [facility] OR;  Service:  Neurosurgery;  Laterality: Bilateral;  Bilateral Deep brain stimulator placement  . UPPER GASTROINTESTINAL ENDOSCOPY     Social History   Socioeconomic History  . Marital status: Married    Spouse name: None  . Number of children: None  . Years of education: None  . Highest education level: None  Social Needs  . Financial resource strain: None  . Food insecurity - worry: None  . Food insecurity - inability: None  . Transportation needs - medical: None  . Transportation needs - non-medical: None  Occupational History  . None  Tobacco Use  . Smoking status: Never Smoker  . Smokeless tobacco: Never Used  Substance and Sexual Activity  . Alcohol use: Yes    Comment: Occ  . Drug use: No  . Sexual activity: None  Other Topics Concern  . None  Social History Narrative  . None   Allergies  Allergen Reactions  . No Known Allergies    Family History  Problem Relation Age of Onset  . Diabetes Other   . Cancer Other      Past medical history, social, surgical and family history all reviewed in electronic medical record.  No pertanent information unless stated regarding to the chief complaint.   Review of Systems:Review of systems updated and as accurate as of 01/03/18  No headache, visual changes, nausea, vomiting, diarrhea, constipation, dizziness, abdominal pain, skin rash, fevers, chills, night sweats, weight loss, swollen  lymph nodes, body aches, joint swelling, chest pain, shortness of breath, mood changes.  Positive muscle aches  Objective  Blood pressure 100/64, pulse 64, height 6\' 2"  (1.88 m), weight 170 lb (77.1 kg), SpO2 95 %. Systems examined below as of 01/03/18   General: No apparent distress alert and oriented x3 mood and affect normal, dressed appropriately. Mild masked facies, HEENT: Pupils equal, extraocular movements intact  Respiratory: Patient's speak in full sentences and does not appear short of breath  Cardiovascular: No lower extremity edema, non  tender, no erythema  Skin: Warm dry intact with no signs of infection or rash on extremities or on axial skeleton.  Abdomen: Soft nontender  Neuro: Cranial nerves II through XII are intact, neurovascularly intact in all extremities with 2+ DTRs and 2+ pulses.  Lymph: No lymphadenopathy of posterior or anterior cervical chain or axillae bilaterally.  Gait normal with good balance and coordination.  MSK:  Non tender with full range of motion and good stability and symmetric strength and tone of  elbows, wrist, hip, knee and ankles bilaterally. Tremor improved today  Mild cogwheeling of UE  Shoulder: Right Inspection reveals no abnormalities, atrophy or asymmetry. Pain over the acromioclavicular joint ROM is full in all planes. Rotator cuff strength normal throughout. Mild impingement still noted Speeds and Yergason's tests normal. No labral pathology noted with negative Obrien's, negative clunk and good stability. Normal scapular function observed. No painful arc and no drop arm sign. No apprehension sign Remarkable.  Mild cogwheeling  Back Exam:  Inspection: Loss of lordosis Motion: Flexion 40 deg, Extension 25 deg, Side Bending to 35 deg bilaterally,  Rotation to 35 deg bilaterally  SLR laying: Negative  XSLR laying: Negative  Palpable tenderness: Tender to palpation the paraspinal musculature lumbar spine more in the thoracolumbar juncture and more over the right sacroiliac joint. FABER: Positive right. Sensory change: Gross sensation intact to all lumbar and sacral dermatomes.  Reflexes: 2+ at both patellar tendons, 2+ at achilles tendons, Babinski's downgoing.  Strength at foot  Plantar-flexion: 5/5 Dorsi-flexion: 5/5 Eversion: 5/5 Inversion: 5/5  Leg strength  Quad: 5/5 Hamstring: 5/5 Hip flexor: 5/5 Hip abductors: 5/5  Gait unremarkable.   Osteopathic findings  C2 flexed rotated and side bent right T3 extended rotated and side bent right inhaled third rib T7 extended  rotated and side bent left L1 flexed rotated and side bent right Sacrum right on right  Procedure After verbal consent patient was prepped with alcohol swabs and with a 25-gauge half inch needle was injected with 0.5 cc of 0.5% Marcaine and 0.5 cc of Kenalog 40 mg/dL into the joint.  This was done from a superior approach.  No blood loss.  Postinjection instructions given.    Impression and Recommendations:     This case required medical decision making of moderate complexity.      Note: This dictation was prepared with Dragon dictation along with smaller phrase technology. Any transcriptional errors that result from this process are unintentional.

## 2018-01-03 NOTE — Assessment & Plan Note (Signed)
Continues to have muscle imbalance.  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  Patient is doing relatively well overall.  We discussed icing regimen, home exercises, which activities doing which wants to avoid.  Patient will continue with conservative therapy.  See me again in 1-2 months.

## 2018-01-03 NOTE — Patient Instructions (Addendum)
Good to see you  Ice is your friend [facility] today and will see how you do  Back is doing great  Xray downstairs  See me again in 4-8 weeks!

## 2018-01-03 NOTE — Assessment & Plan Note (Signed)
Given injection today.  Tolerated procedure well.  Patient's imaging could be warranted.  We discussed icing regimen, home exercises, which activities to do which wants to avoid.  Follow-up with me again in 4-8 weeks

## 2018-01-04 ENCOUNTER — Ambulatory Visit: Payer: Medicare Other

## 2018-01-04 DIAGNOSIS — R471 Dysarthria and anarthria: Secondary | ICD-10-CM

## 2018-01-04 NOTE — Therapy (Signed)
[facility] [address] [place], [place], [address] Phone: [phone]   Fax:  [phone]  Speech Language Pathology Treatment  Patient Details  Name: Ian [name] MRN: [id number] Date of Birth: [birth year] Referring Provider: Alonza Bogus, DO   Encounter Date: 01/04/2018  End of Session - 01/04/18 1531    Visit Number  13    Number of Visits  18    Date for SLP Re-Evaluation  01/18/18    SLP Start Time  3354    SLP Stop Time   1452    SLP Time Calculation (min)  44 min       Past Medical History:  Diagnosis Date  . History of kidney stones    passed  . PD (Parkinson's disease) ([facility]) 09/10/2013  . PONV (postoperative nausea and vomiting)    nausea     Past Surgical History:  Procedure Laterality Date  . COLONOSCOPY    . MINOR PLACEMENT OF FIDUCIAL N/A 01/31/2017   Procedure: MINOR PLACEMENT OF FIDUCIAL;  Surgeon: Erline Levine, MD;  Location: [facility];  Service: Neurosurgery;  Laterality: N/A;  Fiducial placement  . none    . PULSE GENERATOR IMPLANT Right 02/17/2017   Procedure: Right implantable pulse generator placement;  Surgeon: Erline Levine, MD;  Location: [facility];  Service: Neurosurgery;  Laterality: Right;  Bilateral implantable pulse generator placement  . SUBTHALAMIC STIMULATOR INSERTION Bilateral 02/10/2017   Procedure: Bilateral Deep brain stimulator placement;  Surgeon: Erline Levine, MD;  Location: [facility];  Service: Neurosurgery;  Laterality: Bilateral;  Bilateral Deep brain stimulator placement  . UPPER GASTROINTESTINAL ENDOSCOPY      There were no vitals filed for this visit.  Subjective Assessment - 01/04/18 1418    Subjective  "I can definitely say I'm thinking about it most of the time. I'd say I'm slower at least 50% of the time." Pt reports people are not asking him to repeat as often.    Currently in Pain?  No/denies            ADULT SLP TREATMENT - 01/04/18 1420      General  Information   Behavior/Cognition  Alert;Cooperative;Pleasant mood      Treatment Provided   Treatment provided  Cognitive-Linquistic      Cognitive-Linquistic Treatment   Treatment focused on  Dysarthria    Skilled Treatment  Pt reports people are asking him to repeat less this past week than previously. He read 2-3 paragraph selsections andSLP assessed that maintained slower rate approx 90% of the time, wihtout notable rushes of speech. In 18 minutes conversation split up into 3 minute intervals outside ST room, pt maintained slower rate approx 70% of the time, with >95% intelligibility.       Assessment / Recommendations / Plan   Plan  Continue with current plan of care likely one-two more sessions      Progression Toward Goals   Progression toward goals  Progressing toward goals         SLP Short Term Goals - 11/15/17 1423      SLP SHORT TERM GOAL #1   Title  pt will produce 19/20 sentences with reduced speech rate over 3 sessions    Status  Achieved      SLP SHORT TERM GOAL #2   Title  pt will read functionally for one minute using slowed speech rate    Status  Achieved      SLP SHORT TERM GOAL #3   Title  pt will demo slower speech producing >95% intelligible speech in 7 minutes simple conversation outside ST room    Status  Partially Met       SLP Long Term Goals - 01/04/18 [place] #1   Title  pt will demo slower speech producing >95% intelligible speech in 10 minutes simple to mod complex conversation outside [place] room x3 sessions    Baseline  12-27-17, 01-04-18    Time  1  LTG renewed 12-05-17    Period  Weeks    Status  On-going and ongoing      SLP LONG TERM GOAL #2   Title  pt will report average no more than 2 requests asking him to repeat daily    Status  Achieved       Plan - 01/04/18 1531    Clinical Impression Statement  Pt achieving slower speech rate in simple-mod complex conversation at greater percentages than previous session.  Reports that wife is not asking him to repeat as much as between previous sessions. Pt will likely be d/c due to meeting all goals in next 1-2 sessions however continued skilled ST at once/week to ensure consistency for intellgibility is warranted.    Speech Therapy Frequency  1x /week    Duration  4 weeks    Treatment/Interventions  Compensatory techniques;Internal/external aids;SLP instruction and feedback;Patient/family education;Functional tasks;Cueing hierarchy;Environmental controls    Potential to Achieve Goals  Good       Patient will benefit from skilled therapeutic intervention in order to improve the following deficits and impairments:   Dysarthria and anarthria    Problem List Patient Active Problem List   Diagnosis Date Noted  . 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 Acute bursitis of right shoulder 12/06/2017  . Antalgic gait 01/24/2017  . Intercostal muscle tear 07/19/2016  . PD (Parkinson's disease) ([facility]) 06/24/2016  . 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 Nonallopathic lesion of thoracic region 01/25/2016  . Nonallopathic lesion-rib cage 01/25/2016  . Nonallopathic lesion of cervical region 01/25/2016  . Parkinson's disease ([facility]) 01/25/2016  . Lateral epicondylitis 12/01/2014  . REM behavioral disorder 08/27/2014  . Paralysis agitans ([place]) 10/18/2013    [place] ,[place], [place]  01/04/2018, 3:34 PM  [facility] [address] [place], [place], [address] Phone: [phone]   Fax:  [phone]   Name: Ian [name] MRN: [id number] Date of Birth: Aug 05, 1961

## 2018-01-12 ENCOUNTER — Ambulatory Visit: Payer: Medicare Other

## 2018-01-12 DIAGNOSIS — R471 Dysarthria and anarthria: Secondary | ICD-10-CM | POA: Diagnosis not present

## 2018-01-13 NOTE — Therapy (Signed)
[facility] [address] [place], [place], [address] Phone: [phone]   Fax:  [phone]  Speech Language Pathology Treatment  Patient Details  Name: Ian [name] MRN: [id number] Date of Birth: 04/11/1961 Referring Provider: Alonza Bogus, DO   Encounter Date: 01/12/2018  End of Session - 01/13/18 1226    Visit Number  14    Number of Visits  18    Date for SLP Re-Evaluation  01/18/18    SLP Start Time  1103    SLP Stop Time   1145    SLP Time Calculation (min)  42 min    Activity Tolerance  Patient tolerated treatment well       Past Medical History:  Diagnosis Date  . History of kidney stones    passed  . PD (Parkinson's disease) ([facility]) 09/10/2013  . PONV (postoperative nausea and vomiting)    nausea     Past Surgical History:  Procedure Laterality Date  . COLONOSCOPY    . MINOR PLACEMENT OF FIDUCIAL N/A 01/31/2017   Procedure: MINOR PLACEMENT OF FIDUCIAL;  Surgeon: Erline Levine, MD;  Location: [facility];  Service: Neurosurgery;  Laterality: N/A;  Fiducial placement  . none    . PULSE GENERATOR IMPLANT Right 02/17/2017   Procedure: Right implantable pulse generator placement;  Surgeon: Erline Levine, MD;  Location: [facility];  Service: Neurosurgery;  Laterality: Right;  Bilateral implantable pulse generator placement  . SUBTHALAMIC STIMULATOR INSERTION Bilateral 02/10/2017   Procedure: Bilateral Deep brain stimulator placement;  Surgeon: Erline Levine, MD;  Location: [facility];  Service: Neurosurgery;  Laterality: Bilateral;  Bilateral Deep brain stimulator placement  . UPPER GASTROINTESTINAL ENDOSCOPY      There were no vitals filed for this visit.  Subjective Assessment - 01/12/18 1110    Subjective  Pt's wife attends therapy with pt today.    Patient is accompained by:  Family member Vivien Rota, wife    Currently in Pain?  No/denies            ADULT SLP TREATMENT - 01/13/18 0001      General Information   Behavior/Cognition  Alert;Cooperative;Pleasant mood      Treatment Provided   Treatment provided  Cognitive-Linquistic      Cognitive-Linquistic Treatment   Treatment focused on  Dysarthria    Skilled Treatment  Pt wife asking questions about home tasks, SLP encouraged practicing at least once per day with wife, as well as at least 3 times a week with other individuals. During session today he used slower rate of speech 75% of the time, mod complex conversation of 25-30 minutes. Pt encouraged NOt to read as primary means of practice over time, SLP explained to wife why tis recommendation.      Assessment / Recommendations / Plan   Plan  -- one more session, then d/c      Progression Toward Goals   Progression toward goals  Progressing toward goals         SLP Short Term Goals - 11/15/17 1423      SLP SHORT TERM GOAL #1   Title  pt will produce 19/20 sentences with reduced speech rate over 3 sessions    Status  Achieved      SLP SHORT TERM GOAL #2   Title  pt will read functionally for one minute using slowed speech rate    Status  Achieved      SLP SHORT TERM GOAL #3   Title  pt will  demo slower speech producing >95% intelligible speech in 7 minutes simple conversation outside [place] room    Status  Partially [place] - 01/13/18 [place] #1   Title  pt will demo slower speech producing >95% intelligible speech in 10 minutes simple to mod complex conversation outside [place] room x3 sessions    Baseline  12-27-17, 01-04-18    Time  1  LTG renewed 12-05-17    Status  On-going and ongoing      SLP LONG TERM GOAL #2   Title  pt will report average no more than 2 requests asking him to repeat daily    Status  Achieved       Plan - 01/13/18 1227    Clinical Impression Statement  Pt achieving slower speech rate in approx 30 minutes mod complex conversation today. Pt will likely be d/c next session however continued skilled ST at once/week to  ensure consistency for intellgibility is warranted.    Speech Therapy Frequency  1x /week    Duration  4 weeks    Treatment/Interventions  Compensatory techniques;Internal/external aids;SLP instruction and feedback;Patient/family education;Functional tasks;Cueing hierarchy;Environmental controls    Potential to Achieve Goals  Good       Patient will benefit from skilled therapeutic intervention in order to improve the following deficits and impairments:   Dysarthria and anarthria    Problem List Patient Active Problem List   Diagnosis Date Noted  . 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 Acute bursitis of right shoulder 12/06/2017  . Antalgic gait 01/24/2017  . Intercostal muscle tear 07/19/2016  . PD (Parkinson's disease) ([facility]) 06/24/2016  . 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 Nonallopathic lesion of thoracic region 01/25/2016  . Nonallopathic lesion-rib cage 01/25/2016  . Nonallopathic lesion of cervical region 01/25/2016  . Parkinson's disease ([facility]) 01/25/2016  . Lateral epicondylitis 12/01/2014  . REM behavioral disorder 08/27/2014  . Paralysis agitans ([facility]) 10/18/2013    [place] ,[place], [place]  01/13/2018, 12:30 PM  [facility] [address] [place], [place], [address] Phone: [phone]   Fax:  [phone]   Name: Jonan Seufert MRN: [id number] Date of Birth: 1961-07-04

## 2018-01-30 ENCOUNTER — Encounter: Payer: Self-pay | Admitting: Neurology

## 2018-02-01 ENCOUNTER — Encounter: Payer: Self-pay | Admitting: Neurology

## 2018-02-05 ENCOUNTER — Encounter: Payer: Self-pay | Admitting: Neurology

## 2018-02-05 ENCOUNTER — Ambulatory Visit (INDEPENDENT_AMBULATORY_CARE_PROVIDER_SITE_OTHER): Payer: Medicare Other | Admitting: Neurology

## 2018-02-05 VITALS — BP 110/64 | HR 100 | Ht 74.0 in | Wt 174.0 lb

## 2018-02-05 DIAGNOSIS — G4710 Hypersomnia, unspecified: Secondary | ICD-10-CM

## 2018-02-05 DIAGNOSIS — G20 Parkinson's disease: Secondary | ICD-10-CM

## 2018-02-05 MED ORDER — MODAFINIL 200 MG PO TABS: 200.0000 mg | ORAL_TABLET | Freq: Every day | ORAL | 1 refills | Status: DC

## 2018-02-05 NOTE — Progress Notes (Signed)
Ian [name] was seen today in the movement disorders clinic for neurologic consultation at the request of his PCP, Dr. Drue Second.  He has previously seen  Patient, [name] and saw [name] in [place].   This patient is accompanied in the office by his spouse who supplements the history.The consultation is for the evaluation of PD.  I did review Dr. [name] notes.  Dr. Hurley Cisco notes are not available to me.    Hist first sx was tremor was R arm tremor.  It was in 2011 according to the patient today, but Dr. [name] notes indicate that perhaps this was in 2009.  He was dx with enhanced physiologic tremor.  He was started on propranolol with some help, but made him sluggish.  He went to see [name] in 2013 for an opinion in [place].  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  He was placed on baclofen and requip.  He thinks that the requip has helped; he is able to turn over in the bed better and move better.  The pt first saw Dr. [name] on 09/10/13.  Dr. [name] agreed with the Requip and start the patient on [name], but the patient is currently only on its once per day.  12/04/13 update:  The pt presents to the clinic today for his PD.  He is exercising faithfully.  He attends the PD exercise class through the rehab center. The pt was changed to the IR formulation of carbidopa/levodopa 25/100 last visit and is on it tid.  He is not sure it helps.  Afternoons are better than mornings.  carbidopa/levodopa 25/100 is taken at 8am/3:30 pm and bedtime. He is also on requip xl 6 mg daily.  Continues to have significant tremor.  01/27/14 update:  The patient presents today with his wife, who supplements the history.  The pt has a hx of PD and last visit, we decided to increase his carbidopa/levodopa to 2 po tid.  the patient states that he just felt lightheaded on this medication  dosage and up going back to [name] 3 times per day and then takes [name], half tablet twice per day. He remains on requip xl 6 mg daily.  Overall, he does feel fairly good.  He still has tremor.  He went to see Dr. [name] since our last visit and again talked about DBS therapy.  Dr. [name] mentioned to him that he looks much better and he had previously and had [name] score had declined.  Some yelling out in the sleep but this has not been a huge issue.  No sleepwalking/talking.  No falls.  He is exercising faithfully.  05/28/14 update:  The patient is presenting today for followup.  Last visit, I increased his [name] from 6 mg daily to 4 mg twice a day.  He is doing well with this.  Tremor is about the same.  He is on a strange combination of carbidopa levodopa, but when I tried to switch him off the IR version, the patient just felt lightheaded and ended up going back to a combination of IR and CR.  He is currently on 25/100 CR 3 times per day and then takes [name], half tablet twice per day and sometimes three times per day.  He does best in the evening and worst in the AM.   No swallowing troubles.  Has dropped weight but thinks that it is exercise and was trying to follow gluten free diet. He is walking, doing a spinning class for PD, and has started golfing again, which he hasn't done in years.   Still considering DBS therapy.    08/27/14 update:  Patient presents today for followup.  He is currently on [name], 4mg  twice a day.  He is on a strange combination of carbidopa levodopa, but hasn't been able to tolerate the IR version well.[name]  He is currently on 25/100 CR 2-3 times per day and then 50/200 at night.  He rarely takes the IR now; only if playing golf or if he needs a boost during the day.  He is finding that he is having more tremor.  He is also having dreams at night that are making his sleep feel unrestful.  He is strongly  considering deep brain stimulation.  He asks me about details regarding this today.  He has asks me about potentially getting a refill on his Ativan.  He apparently got this filled from an outside physician about a year and a half ago and only had 10 pills in total and is just now running out.  He takes one pill every 3 or 4 months if he has to go to a function with his wife.  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  He asks me if he can have a refill today.  09/10/14 update:  The patient is seen today unexpectedly.  Last visit, I started [name] 145 mg, 2 tablets 3 times per day.  He is also on [name] 4 mg twice a day.  He called me initially to tell me that the [name] was working great and he felt much better on the medication.  In fact, he states that his PT and OT reported that he looked much better and his eval scores were markedly improved and they asked him what he had done differently.  He states last week was just a great week.  On Saturday, he was just a little lightheaded and his wife checked his orthostatics and he states that they were negative.  This week, however, he began to report some abnormal movements.  The movements were primarily of the left shoulder and in the abdomen. I wanted to see him on this medication before added or changed anything, as I have never seen him with dyskinesia.  Unfortunately, this morning he only took one of his tablets, but he did take 2 tablets at lunch time about 1 hour before coming.  He has not noticed the movements today.  He has noticed much less tremor overall since starting [name].  11/25/14 update:  The patient returns today for follow-up.  He is on [name] 4 mg twice a day and [name] 145 mg, 2 tablets 3 times per day.  His biggest issue is tremor, but states that its under good control today (but he just exercised).  He has developed tendinitis in the elbow.  He has clonazepam for REM behavior  disorder but admits he doesn't use it.  The patient remains very active in our Parkinson's exercise classes.  No falls.  Some days he does have to "fight" the posture and the "shuffling."  He didn't ever get to see Dr. Venetia Maxon as he states that his life got hectic moving his mother in law.    02/20/15 update:  The patient returns today, accompanied by his  wife who supplements the history.  Last visit, I increased his [name] to 195 mg, and he is taking 2 tablets 3 times a day.  He is also on [name] 4 mg twice a day.  He reports that he has not been doing as well because stress levels have been increased.  His mother-in-law, who is living with them, died unexpectedly.  Because of that, he has had more trouble sleeping as well.  He is still faithfully attending exercise classes and is involved in the Parkinson's community.  He has noticed more rigidity and tremor.  He is taking melatonin, 5 Mill grams at night to help him sleep but does not want to use clonazepam, even though he has at home.  05/24/25 update:  The patient returns today for follow-up.  His wife accompanies him and supplements the history.  He is on [name] 4 mg twice a day and [name] 195 mg, 2 tablets 3 times per day.  He has not wanted to increase the dose, although he likely could use more levodopa. States that he is more tremulous right now and attributes that to the fact that he has had a GI illness recently, which made sx's worse.  He has had emesis (one episode).  No diarrhea.  No fevers.    He has not had any falls.  He denies any hallucinations.  No confusion.  No lightheadedness or near syncope.  He continues to exercise faithfully.  06/12/15 update:  The patient is following up today, accompanied by his wife who supplements the history.  When I saw him last on 05/22/15, he was obviously underdosed and I expressed the need for more levodopa but the pt didn't want to increase it as he felt that his viral gastroenteritis was just causing his  worsening sx's.  He didn't tell me then that he had been off of his meds for 3 days and that is the reason he didn't look well.  They called me on 06/10/2015 however to state that he was doing much worse.  They wanted me to work him in that day, but I was not able to.  I didn't realize that he had been off all his PD meds since June 1 and had started receiving care from [place] natureopath in [place], Dr. Beckie BusingUrban.  He was given information on Dr. Beckie BusingUrban by his dentist Dr. Mellody DanceKeith, who told him he should try amino acid therapy.  He researched and started seeing Dr. Beckie BusingUrban and was told he needed to be off all his PD meds.  He started on D5-mucuna which markets itself as a "safe, natural" levodopa therapy.  He was told that he needed 4-6 months to see a response, but admits that he was told to tell his doctor what he was doing.  He was increasing the dose of this supplement based on urine dopamine levels which they were told were low.  They apparently were also measuring urine serotonin levels.  Not surprisingly, the patient decompensated.  He states that initially he didn't "feel that bad" but the "tremor was awful."  He also states that he had terrible vivid dreams and ultimately became exhausted from not sleeping, shaking and increased rigidity. It got so bad that he ended up in the ER 2 days ago.    He is back on rytary 195, 2 po tid.  He is a little quesy since being back on it.  He has only been on it for 2 days.  He states that  he talked with the natureopath and she told him to just "start back on it."  He is off of the D5-mucuna.  He and his wife bring in several articles for me to read today.    09/29/15 update:  The patient is following up today, accompanied by his wife who supplements the history.  He is on [name] 4 mg twice a day.  He reports that he is now taking  [name] 195 mg, 2 tablets 3 times a day along with [name] 145 mg, one tablet 3 times a day.  He spreads it out so that he takes it at 8 AM/3 PM/11 PM.   He does not necessarily halfway he describes as "traditional wearing off" but states that at the end of the dose he just does not feel good.  Overall, the patient states that he has been doing okay but he also feels that things are more unpredictable.  He feels more tired and thinks that he has mild dyskinesia.  When he eats, he is biting his lip in the same place.   He denies any lightheadedness or near syncope.  He denies falls.  When asked about depression, he states that he thinks "depression is a strong word" but admits that he gets frustrated and overwhelmed about the disease but uses exercise as a release.  He has sent me paperwork since last visit for his disability which has been completed.  He asks me about writing another letter regarding his VA disability as well.  12/30/15 update:  The patient is following up today.  Last visit, I increased his [name] to 6 mg twice a day.  His [name] has been slightly increased, so that he is on 195 mg, 3 tablets 3 times per day and he added an additional 1 tablet at night because he was awakening at 4 AM with tremor. He has changed that again, but taking the same overall dose of rytary, but is now taking 195, 3 in the AM, 2 in the afternoon, 3 in the evening and then takes 2 at bedtime.   Overall, he thinks he feels that it is a little inconsistent; sometimes he does well and sometimes he doesn't.  He continues to faithfully exercise.  No falls.  No hallucinations.  No syncope but has had some lightheadedness; he has had a uri and isn't sure if it is related to that/meds for that.  Mood has been good.  Sleep has been better since he has been back on his medications.  Appetite has been good.  No new medical issues.  He is still going to integrative therapies and thinks that it is helpful.  However, he did some therapy called alpha stim and was told that it changed the alpha waves in the brain and he didn't think that it was helpful and thinks that it made him feel  strange.  Feels that he has minor dyskinesia, especially of the arm when he walks or bends over.  Asks about DBS therapy and thinks he is going to consider that more this year.    03/03/16 update:  The patient is following up today.  He is on [name], 6 mg twice a day and [name] 195 mg on the following schedule: 3/3/3/2.  This is a little higher than last visit (prior 3/2/3/2).  He denies any falls since last visit.  He denies lightheadedness or near syncope.  No hallucinations.  Tremor has been pretty good, but it will fluctuate throughout the  day (states that he did 2 workouts today).  He does occasionally feel like he has some dyskinesia.  He saw Dr. [name] on February 6 and March 13 and I reviewed those records.  OMT was performed.  The patient feels that that was helpful for his musculoskeletal complaints.  He is doing lots of exercises.  Mood is stable.  Asks about meeting with Dr. Venetia Maxon for DBS but wife not ready to pursue.    06/24/16 update:  The patient is following up today, accompanied by his wife who supplements the history.    He is here for her levodopa challenge test.  He saw Dr. Venetia Maxon in May and is interested in DBS therapy.  Last visit, I increased his [name] so that he is taking 4 mg, 2 tablets twice a day.  He remains on [name] 195 mg on the following schedule: 3/3/3/2.  He takes clonazepam as needed for REM behavior disorder.  He has not had falls since last visit.  He has seen Dr. [name] several times and I reviewed those records.  He has not had any lightheadedness or near syncope.  He continues to exercise faithfully.  10/18/16 update:  Pt f/u today.  Wife doesn't accompany today.  He saw Dr. Alinda Dooms since our last visit and no evidence of cognitive deficits.  Pt on [name] 4 mg - 2 po bid and rytary 195 mg: and he has changed that from 3/3/3/2 to 4/3/3/3.  He has a little more off time with more tremor and slowness.  In late summer, he was putting a putting green into his  back yard and states that he tore an intercostal muscle so he couldn't exercise for 8-9 weeks because of pain with breathing.  Started back to working out 3-4 weeks ago.   01/14/16 update:  Patient follows up today, as he has some questions regarding DBS therapy.  Since our last visit, the [facility] device for deep brain stimulation has been FDA approved.  The patient and I discussed the device over the telephone, and he subsequently read about the device and thought that he would prefer to have this particular device and presents today to talk about it in more detail.  He remains on [name], 4 mg, and takes 2 tablets twice per day.  He also takes [name] 195 mg: 4/3/3/3.  Last visit, I gave him some carbidopa/levodopa 25/100 to use as needed and he states that he tried that but it didn't work well for him and so he isn't using that much.  01/31/17 update:  Patient follows up today for preop DBS recording.  Wife present and supplements history.  He had fiducials placed today.  He was to hold his meds this AM but didn't and feels like he is "on."    03/13/17 update:  Patient follows up today.  Patient is accompanied by his twin sister and wife who supplements the history.  The patient underwent bilateral STN DBS with [facility] [employer] device on 02/10/2017 and had IPG placement on 02/17/2017.  The patient has recovered nicely postoperatively.  He had some increase dyskinesia and emailed me about that.  I did tell him he could slightly decrease his [name] if he would like.  He states that he has been taking [name] 195, 3 po tid and [name], 4 mg, 2 tablets twice per day.  He held his Requip today and last took [name], 3 of them at 8 am.  He has had  no falls.  No lightheadedness or near syncope.  03/20/17 update:  Patient is seen today back in follow-up.  He is accompanied by his wife who supplements the history.  His device was activated one week ago, but we did see him today after activation as he was  having dyskinesia on the right side of the body.  The patient relates that the dyskinesia seem to be related to taking [name] 195 mg, 2 tablets and 1 [name] 4 mg.  He ended up not taking the medication and the dyskinesia when away.  However, when he was seen back to adjust the programming, he was more rigid, but was reluctant for me to change anything because he no longer had the dyskinesia.  We went up slightly on the left hemibody (right brain) and gave him some control over the left brain to go up and down.  He is still having some tremor in his feet.  He states that he is only taking med prn.  He took 1 Requip on Friday, none on Saturday, 1 requip and 1 rytary yesterday.  He feels that he did well yesterday with tremor and slight dyskinesia.  Wife states that she notices that patient has been "melancholy."  Part of this is because of the frustration with balance of tremor and dyskinesia and part of it is because he has not been back to exercising lately.  04/14/17 update:  Patient is seen back today in follow-up.  He has adjusted his DBS some on his own.  He states that he has had some difficulty in balancing symptoms between the left and right side of the body.  He has also been having some challenges with mood.  He was taking carbidopa/levodopa 25/100, 2-3 times per day, but this past week he ended up switching to [name] 195 mg and has been taking that 3 times per day.  He does think that that has helped mood somewhat.  He is back to exercising.  He has not fallen.  06/23/17 update:  Patient is seen today in follow-up.  He is currently on [name], 145 mg and takes 2 to 3 [name] at 8:00am, 2 [name] at 1:00pm, 2 [name] At 6:00pm and 1 [name] at bedtime.  He has been off and on the Requip.  He initially thought it made him fatigued, but went back to taking the 2 mg tablets and is on the XL.  Thinks that the XL gives him some dyskinesia.   Nonmotor symptoms such as fatigue and apathy have been the biggest  issues.  He really has not wanted to take any antidepressants.  States that he is not depressed and taking supplement of amino acid triptophan and he feels apathy is better.  He has struggled with voice strength.  He has not had falls.  He is back to exercise.  For the first time in a long time, he was able to walk the entire golf course and play a round of golf without a feeling of wearing off of meds.  10/17/17 update: Patient seen today in follow-up for Parkinson's disease, status post DBS surgery.  He is on rytary, 145 mg and takes 2-3 [name] at 8 AM, 2 [name] at 1 PM, 2 [name] at 6 PM and 1 [name] at bedtime.  He is on ropinirole XL, 2 mg daily.  No falls.  No lightheadedness or near syncope.  No hallucinations.  No compulsive behaviors.  No sleep attacks.  He is doing spin class 2 days  per week, lifting weights 4 days per week, [employer] classs.  He is busy with church.  Not much dyskinesia.  Not much tremor.  He turned L side down a little.  He is going to [place] with [employer] to tell his story.  He is fatigued but doesn't want medication for that  02/05/18 update: Patient is seen today in follow-up for Parkinson's disease.  Patient emailed me this past Friday to let me know that he has been "experimenting" with how he takes his medication, mixing both [name] with immediate release levodopa.  Today, he tells me that he is currently taking rytary, 145 mg, 2-3 tablets 3 times per day and [name] prn (generally one time per day).  He reports that the tremor in the left foot is fairly unpredictable and when bad, he uses it as a sign that it is time for medication.  In his email, he also felt that the Requip was not helping, so I told him to go ahead and discontinue that over the weekend and we would address it today.   He ended up stopping it last night as he had stress over the week. He has attended speech therapy.  He also attended the new thrive and connect group for active patients with  Parkinson's disease.  In fact, he will be leading this group.  PREVIOUS MEDICATIONS: Requip and neupro (tried few days only); propranolol helped tremor some  ALLERGIES:   Allergies  Allergen Reactions  . No Known Allergies     CURRENT MEDICATIONS:   Allergies as of 02/05/2018      Reactions   No Known Allergies       Medication List        Accurate as of 02/05/18  1:58 PM. Always use your most recent med list.          carbidopa-levodopa 25-100 MG tablet Commonly known as:  SINEMET IR Take 1 tablet by mouth as needed.   Carbidopa-Levodopa ER 36.25-145 MG Cpcr Commonly known as:  [name] Take 1 tablet by mouth See admin instructions. 3 in the morning, 2 in the afternoon, 2 in the evening, 1 at night   LECITHIN PO Take 1 capsule by mouth daily.   modafinil 200 MG tablet Commonly known as:  PROVIGIL Take 1 tablet (200 mg total) by mouth daily.   MULTI-VITAMINS Tabs Take 1 tablet by mouth 2 (two) times daily.   niacin 500 MG tablet Take 500 mg by mouth 3 (three) times daily with meals.   rOPINIRole 2 MG 24 hr tablet Commonly known as:  [name] Take 1 tablet (2 mg total) by mouth daily.   vitamin C 1000 MG tablet Take 5,000 mg by mouth daily.   Vitamin D-3 5000 units Tabs Take 5,000 Units by mouth daily.   vitamin E 400 UNIT capsule Take 400 Units by mouth daily.        PAST MEDICAL HISTORY:   Past Medical History:  Diagnosis Date  . History of kidney stones    passed  . PD (Parkinson's disease) (HCC) 09/10/2013  . PONV (postoperative nausea and vomiting)    nausea     PAST SURGICAL HISTORY:   Past Surgical History:  Procedure Laterality Date  . COLONOSCOPY    . MINOR PLACEMENT OF FIDUCIAL N/A 01/31/2017   Procedure: MINOR PLACEMENT OF FIDUCIAL;  Surgeon: Maeola Harman, MD;  Location: [facility] OR;  Service: Neurosurgery;  Laterality: N/A;  Fiducial placement  . none    .  PULSE GENERATOR IMPLANT Right 02/17/2017   Procedure: Right implantable pulse  generator placement;  Surgeon: Maeola Harman, MD;  Location: [facility] OR;  Service: Neurosurgery;  Laterality: Right;  Bilateral implantable pulse generator placement  . SUBTHALAMIC STIMULATOR INSERTION Bilateral 02/10/2017   Procedure: Bilateral Deep brain stimulator placement;  Surgeon: Maeola Harman, MD;  Location: [facility] OR;  Service: Neurosurgery;  Laterality: Bilateral;  Bilateral Deep brain stimulator placement  . UPPER GASTROINTESTINAL ENDOSCOPY      SOCIAL HISTORY:   Social History   Socioeconomic History  . Marital status: Married    Spouse name: Not on file  . Number of children: Not on file  . Years of education: Not on file  . Highest education level: Not on file  Social Needs  . Financial resource strain: Not on file  . Food insecurity - worry: Not on file  . Food insecurity - inability: Not on file  . Transportation needs - medical: Not on file  . Transportation needs - non-medical: Not on file  Occupational History  . Not on file  Tobacco Use  . Smoking status: Never Smoker  . Smokeless tobacco: Never Used  Substance and Sexual Activity  . Alcohol use: Yes    Comment: Occ  . Drug use: No  . Sexual activity: Not on file  Other Topics Concern  . Not on file  Social History Narrative  . Not on file    FAMILY HISTORY:   Family Status  Relation Name Status  . Mother  Deceased at age 10       Breast Cancer  . Father  Alive       Diabetes  . Sister  Alive       healthy  . Daughter  Alive       2, twins  . Son  Alive  . Other  (Not Specified)    ROS: A complete 10 system review of systems was obtained and was unremarkable apart from what is mentioned above.  PHYSICAL EXAMINATION:    VITALS:   Vitals:   02/05/18 1303  BP: 110/64  Pulse: 100  SpO2: 97%  Weight: 174 lb (78.9 kg)  Height: 6\' 2"  (1.88 m)   Wt Readings from Last 3 Encounters:  02/05/18 174 lb (78.9 kg)  01/03/18 170 lb (77.1 kg)  12/06/17 171 lb (77.6 kg)    GEN:  The patient appears stated  age and is in NAD. HEENT:  Normocephalic.  Fiducials in place.  Bandaids over fiducial areas.  Neurological examination:  Orientation: The patient is alert and oriented x3.  Cranial nerves: There is good facial symmetry. The speech is fluent and clear. Soft palate rises symmetrically and there is no tongue deviation. Hearing is intact to conversational tone. Sensation: Sensation is intact to light touch throughout.  Movement Examination: Tone: normal in the UE/LE  Abnormal movements: There is tremor in the bilateral lower extremity, L more than right Coordination:  There is normal rapid alternating movements. Gait and Station: The patient has no difficulty arising out of a deep-seated chair without the use of the hands.  He walks well down the hall with only slightly decreased arm swing on the right.  DBS activation was performed today and is described in more detail in a separate programming procedure note.  In brief, I tried to change contacts to 10- (50%) 11- (50%), but patient felt that he had speech changes and a strange sensation in his left face.  Decreased the percentage on 10-  and he felt this way all the way down to 5%.  Patient reprogrammed and he felt much better and tremor was improved.  ASSESSMENT/PLAN:  1. idiopathic Parkinson's disease.  The patient has tremor, bradykinesia, rigidity and mild postural instability.  -The patient underwent bilateral STN DBS with [facility] device on 02/10/2017 and had IPG placement on 02/17/2017.  -Continue [name], 145 mg and takes 2  [name] at 8:00am, 2 [name] at 1:00pm, 2 [name] At 6:00pm and 1 [name] at bedtime  -Discontinue [name], 2 mg daily.  Told him to watch mood. 2.  Fatigue and daytime hypersomnolence  -Start Provigil, 200 mg daily.  Risks, benefits, side effects and alternative therapies were discussed.  The opportunity to ask questions was given and they were answered to the best of my ability.  The patient expressed  understanding and willingness to follow the outlined treatment protocols. 3.  REM behavior disorder.  -He has clonazepam but no longer using 4.  Much greater than 50% of this visit was spent in counseling and coordinating care.  Total face to face time:  25 min, not including DBS time

## 2018-02-05 NOTE — Procedures (Signed)
DBS Programming was performed.    Total time spent programming was 25 minutes.  Device was confirmed to be on.    Soft start was turned on at 10.  Impedences were checked and were within normal limits.  Battery was checked and was fully recharged.  Final settings were as follows:  Left brain electrode:     3-C+            ; Amplitude  3.4   mamps (can adjust 2.0-3.7)   ; Pulse width 60 microseconds;   Frequency   130   Hz.  Right brain electrode:     10-30-09+ (with 20% at 12- and 80% at 11-)   ; Amplitude   3.3  mamps (can adjust 1.0-3.7);  Pulse width 60  microseconds;  Frequency   130    Hz.  (10-C+ with L shoulder dyskinesia at 0.217mAmp) (11-C+ with same at 0.5 mAmp) (11-10+ with same at 0.288mAmp) (09-29-11+ with speech change and a strange sensation in the L face at 2.4 no matter if 10-was at 5% or 60%, although stronger at 60%)

## 2018-02-05 NOTE — Patient Instructions (Signed)
1.  Start provigil 200 mg in the AM

## 2018-02-07 ENCOUNTER — Encounter: Payer: Medicare Other | Admitting: Neurology

## 2018-02-13 ENCOUNTER — Ambulatory Visit (INDEPENDENT_AMBULATORY_CARE_PROVIDER_SITE_OTHER)
Admission: RE | Admit: 2018-02-13 | Discharge: 2018-02-13 | Disposition: A | Discharge status: Home / Self Care | Payer: Medicare Other | Source: Ambulatory Visit | Attending: Family Medicine | Admitting: Family Medicine

## 2018-02-13 DIAGNOSIS — M25511 Pain in right shoulder: Secondary | ICD-10-CM | POA: Diagnosis not present

## 2018-02-13 DIAGNOSIS — G8929 Other chronic pain: Secondary | ICD-10-CM

## 2018-02-13 NOTE — Progress Notes (Signed)
[name], [place] [address] Phone: [phone] Subjective:      CC: Back pain follow-up  [id number]  Ian [name] is a 57 y.o. male coming in with complaint of back pain follow-up.  Patient has been found to be doing relatively well.  Does have a history of Parkinson's and now a deep brain stimulator.  Patient has continued to be active.  Patient states doing well overall.  He does have some discomfort from time to time.      Past Medical History:  Diagnosis Date  . History of kidney stones    passed  . PD (Parkinson's disease) ([facility]) 09/10/2013  . PONV (postoperative nausea and vomiting)    nausea    Past Surgical History:  Procedure Laterality Date  . COLONOSCOPY    . MINOR PLACEMENT OF FIDUCIAL N/A 01/31/2017   Procedure: MINOR PLACEMENT OF FIDUCIAL;  Surgeon: Maeola Harman, MD;  Location: [facility] OR;  Service: Neurosurgery;  Laterality: N/A;  Fiducial placement  . none    . PULSE GENERATOR IMPLANT Right 02/17/2017   Procedure: Right implantable pulse generator placement;  Surgeon: Maeola Harman, MD;  Location: [facility] OR;  Service: Neurosurgery;  Laterality: Right;  Bilateral implantable pulse generator placement  . SUBTHALAMIC STIMULATOR INSERTION Bilateral 02/10/2017   Procedure: Bilateral Deep brain stimulator placement;  Surgeon: Maeola Harman, MD;  Location: [facility] OR;  Service: Neurosurgery;  Laterality: Bilateral;  Bilateral Deep brain stimulator placement  . UPPER GASTROINTESTINAL ENDOSCOPY     Social History   Socioeconomic History  . Marital status: Married    Spouse name: None  . Number of children: None  . Years of education: None  . Highest education level: None  Social Needs  . Financial resource strain: None  . Food insecurity - worry: None  . Food insecurity - inability: None  . Transportation needs - medical: None  . Transportation needs - non-medical: None  Occupational History  . None  Tobacco Use  .  Smoking status: Never Smoker  . Smokeless tobacco: Never Used  Substance and Sexual Activity  . Alcohol use: Yes    Comment: Occ  . Drug use: No  . Sexual activity: None  Other Topics Concern  . None  Social History Narrative  . None   Allergies  Allergen Reactions  . No Known Allergies    Family History  Problem Relation Age of Onset  . Diabetes Other   . Cancer Other      Past medical history, social, surgical and family history all reviewed in electronic medical record.  No pertanent information unless stated regarding to the chief complaint.   Review of Systems:Review of systems updated and as accurate as of 02/14/18  No headache, visual changes, nausea, vomiting, diarrhea, constipation, dizziness, abdominal pain, skin rash, fevers, chills, night sweats, weight loss, swollen lymph nodes, body aches, joint swelling, chest pain, shortness of breath, mood changes.  Mild positive muscle aches  Objective  Blood pressure 118/80, pulse 80, height 6\' 2"  (1.88 m), weight 170 lb (77.1 kg), SpO2 97 %. Systems examined below as of 02/14/18   General: No apparent distress alert and oriented x3 mood and affect normal, dressed appropriately.  HEENT: Pupils equal, extraocular movements intact  Respiratory: Patient's speak in full sentences and does not appear short of breath  Cardiovascular: No lower extremity edema, non tender, no erythema  Skin: Warm dry intact with no signs of infection or rash on extremities or on  axial skeleton.  Abdomen: Soft nontender  Neuro: Cranial nerves II through XII are intact, neurovascularly intact in all extremities with 2+ DTRs and 2+ pulses.  Lymph: No lymphadenopathy of posterior or anterior cervical chain or axillae bilaterally.  Gait normal with good balance and coordination.  MSK:  Non tender with full range of motion and good stability and symmetric strength and tone of shoulders, elbows, wrist, hip, knee and ankles bilaterally.  Very minimal  cogwheeling noted.  Significant improvement from previous exam Back Exam:  Inspection: Mild loss of lordosis Motion: Flexion 45 deg, Extension 25 deg, Side Bending to 35 deg bilaterally,  Rotation to 35 deg bilaterally  SLR laying: Negative  XSLR laying: Negative  Palpable tenderness: More tenderness in the thoracolumbar juncture on the right side. FABER: Mild tightness bilateral. Sensory change: Gross sensation intact to all lumbar and sacral dermatomes.  Reflexes: 2+ at both patellar tendons, 2+ at achilles tendons, Babinski's downgoing.  Strength at foot  Plantar-flexion: 5/5 Dorsi-flexion: 5/5 Eversion: 5/5 Inversion: 5/5  Leg strength  Quad: 5/5 Hamstring: 5/5 Hip flexor: 5/5 Hip abductors: 5/5  Gait unremarkable.   Osteopathic findings C6 flexed rotated and side bent left T3 extended rotated and side bent right inhaled third rib T10 extended rotated and side bent left L2 flexed rotated and side bent right Sacrum right on right    Impression and Recommendations:     This case required medical decision making of moderate complexity.      Note: This dictation was prepared with Dragon dictation along with smaller phrase technology. Any transcriptional errors that result from this process are unintentional.

## 2018-02-14 ENCOUNTER — Ambulatory Visit (INDEPENDENT_AMBULATORY_CARE_PROVIDER_SITE_OTHER): Payer: Medicare Other | Admitting: Family Medicine

## 2018-02-14 ENCOUNTER — Encounter: Payer: Self-pay | Admitting: Family Medicine

## 2018-02-14 VITALS — BP 118/80 | HR 80 | Ht 74.0 in | Wt 170.0 lb

## 2018-02-14 DIAGNOSIS — M999 Biomechanical lesion, unspecified: Secondary | ICD-10-CM | POA: Diagnosis not present

## 2018-02-14 DIAGNOSIS — M940 Chondrocostal junction syndrome [Tietze]: Secondary | ICD-10-CM

## 2018-02-14 NOTE — Assessment & Plan Note (Signed)
Patient continues to have some difficulty with the rib.  We discussed with patient to continue home exercises and icing regimen.  Still follows up growing regularly at 6-week intervals and does respond well to manipulation.  Follow-up again 6 weeks

## 2018-02-14 NOTE — Patient Instructions (Signed)
Good to see you  See me again in 6 weeks  Watch the diet and see if that causes anything

## 2018-02-14 NOTE — Assessment & Plan Note (Signed)
Decision today to treat with OMT was based on Physical Exam  After verbal consent patient was treated with HVLA, ME, FPR techniques in cervical, thoracic, rib lumbar and sacral areas  Patient tolerated the procedure well with improvement in symptoms  Patient given exercises, stretches and lifestyle modifications  See medications in patient instructions if given  Patient will follow up in 6 weeks 

## 2018-02-26 ENCOUNTER — Ambulatory Visit: Payer: Medicare Other | Attending: Neurology

## 2018-02-26 DIAGNOSIS — R471 Dysarthria and anarthria: Secondary | ICD-10-CM

## 2018-02-26 NOTE — Therapy (Signed)
[facility] [address] [place], [place], [address] Phone: [phone]   Fax:  [phone]  Speech Language Pathology Treatment  Patient Details  Name: Ian [name] MRN: [id number] Date of Birth: [birth year] Referring Provider: Alonza Bogus, DO   Encounter Date: 02/26/2018  End of Session - 02/26/18 1041    Visit Number  15    Number of Visits  18    Date for SLP Re-Evaluation  02/26/18    SLP Start Time  0847    SLP Stop Time   0924    SLP Time Calculation (min)  37 min       Past Medical History:  Diagnosis Date  . History of kidney stones    passed  . PD (Parkinson's disease) ([place]) 09/10/2013  . PONV (postoperative nausea and vomiting)    nausea     Past Surgical History:  Procedure Laterality Date  . COLONOSCOPY    . MINOR PLACEMENT OF FIDUCIAL N/A 01/31/2017   Procedure: MINOR PLACEMENT OF FIDUCIAL;  Surgeon: Erline Levine, MD;  Location: [facility];  Service: Neurosurgery;  Laterality: N/A;  Fiducial placement  . none    . PULSE GENERATOR IMPLANT Right 02/17/2017   Procedure: Right implantable pulse generator placement;  Surgeon: Erline Levine, MD;  Location: [facility];  Service: Neurosurgery;  Laterality: Right;  Bilateral implantable pulse generator placement  . SUBTHALAMIC STIMULATOR INSERTION Bilateral 02/10/2017   Procedure: Bilateral Deep brain stimulator placement;  Surgeon: Erline Levine, MD;  Location: [facility];  Service: Neurosurgery;  Laterality: Bilateral;  Bilateral Deep brain stimulator placement  . UPPER GASTROINTESTINAL ENDOSCOPY      There were no vitals filed for this visit.  Subjective Assessment - 02/26/18 1035    Subjective  Pt changed his follow up appointment to 2-3 weeks later due to needing to cancel his original follow-up appoinment.    Currently in Pain?  No/denies            ADULT SLP TREATMENT - 02/26/18 0855      General Information   Behavior/Cognition   Alert;Cooperative;Pleasant mood      Treatment Provided   Treatment provided  Cognitive-Linquistic      Cognitive-Linquistic Treatment   Treatment focused on  Dysarthria    Skilled Treatment  Pt without his morning parkinson's meds today. Pt reports that his stress level has been higher in the last few weeks and therefore his speech is affected more. Pt reports he has not done his daily routine with slow reading and practicing his slow rate. SLP strongly encouraged him to engage in de-stressing activities, prayer (if desired), meditation, exercise, talking with a friend or Product manager. SLP had pt read paragraphs with slowed rate and pt did so with excellent success. SLP again reiterated pt return to practicing his slowed rate reading daily. In approx 10 minutes mod compelx conversation pt achieved slowed rate 75% of the time to improve intelligibility to 95-100%. Pt is appropriate for d/c at this time, and will request screenings for at least OT and ST in the next 3-6 months.       Assessment / Recommendations / Plan   Plan  Other (Comment) d/c with rescreen      Progression Toward Goals   Progression toward goals  Goals met, education completed, patient discharged from [facility] - 11/15/17 1423      SLP [place] #1   Title  pt  will produce 19/20 sentences with reduced speech rate over 3 sessions    Status  Achieved      SLP SHORT TERM GOAL #2   Title  pt will read functionally for one minute using slowed speech rate    Status  Achieved      SLP SHORT TERM GOAL #3   Title  pt will demo slower speech producing >95% intelligible speech in 7 minutes simple conversation outside ST room    Status  Partially Met       SLP Long Term Goals - 02/26/18 [place] #1   Title  pt will demo slower speech producing >95% intelligible speech in 10 minutes simple to mod complex conversation outside [place] room x3 sessions    Time  --  LTG renewed 12-05-17     Status  Achieved and ongoing      SLP LONG TERM GOAL #2   Title  pt will report average no more than 2 requests asking him to repeat daily    Status  Achieved       Plan - 02/26/18 1042    Clinical Impression Statement  Pt reports having more difficulty in maintaining slower speech over the last 3-4 weeks, due to "lots of stuff hitting me all at once." Pt told SLP he has been noncompliant with daily readings at slower speech rate since last appointment with ST. SLP strongly urged pt to return to these and he will very likely see carryover into his daily speech. Pt will be d/c'd at this time and re-screen suggested in 3-6 months.     Treatment/Interventions  Compensatory techniques;Internal/external aids;SLP instruction and feedback;Patient/family education;Functional tasks;Cueing hierarchy;Environmental controls    Potential to Achieve Goals  Good       Patient will benefit from skilled therapeutic intervention in order to improve the following deficits and impairments:   Dysarthria and anarthria   SPEECH THERAPY RENEWAL/DISCHARGE SUMMARY  Visits from Start of Care: 15  Current functional level related to goals / functional outcomes: Since patient's last visit with SLP he has been noncompliant with daily reading practice to slow rate of speech. As such, he is not having as much success with slowed speech rate as he was when he was practicing every day. SLP strongly urged pt to begin his daily practice again, consistently. See "Skilled intervention" above for more details on today's session. Pt met LTG #1 today, meeting all LTGs for ST. He will be discharged today and agrees with this. A screening in 3-6 months is recommended.   Remaining deficits: Mild intermittent rushes of speech not affecting pt's intelligibility.   Education / Equipment: Compensations for fast rate of speech  Plan: Patient agrees to discharge.  Patient goals were met. Patient is being discharged due to meeting  the stated rehab goals.  ?????Screening suggested in 3-6 months.       Problem List Patient Active Problem List   Diagnosis Date Noted  . 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 Acute bursitis of right shoulder 12/06/2017  . Antalgic gait 01/24/2017  . Intercostal muscle tear 07/19/2016  . PD (Parkinson's disease) ([place]) 06/24/2016  . 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 Nonallopathic lesion of thoracic region 01/25/2016  . Nonallopathic lesion-rib cage 01/25/2016  . Nonallopathic lesion of cervical region 01/25/2016  . Parkinson's disease ([place]) 01/25/2016  . Lateral epicondylitis 12/01/2014  . REM behavioral disorder 08/27/2014  . Paralysis agitans ([place]) 10/18/2013    SCHINKE,CARL ,  MS, CCC-SLP  02/26/2018, 10:47 AM  [facility] [address] [place], [place], [address] Phone: [phone]   Fax:  [phone]   Name: Ian [name] MRN: [id number] Date of Birth: [birth year]

## 2018-02-26 NOTE — Patient Instructions (Signed)
  Get back into the routine of doing your daily readings at the slower pace.  Schedule a screen for 05-15-18, at least for OT/ST.

## 2018-03-06 ENCOUNTER — Telehealth: Payer: Self-pay | Admitting: Neurology

## 2018-03-06 NOTE — Telephone Encounter (Signed)
[facility] and faxed last office note as requested to [phone] with confirmation received.

## 2018-03-28 ENCOUNTER — Ambulatory Visit: Payer: Medicare Other | Admitting: Family Medicine

## 2018-03-28 DIAGNOSIS — Z0279 Encounter for issue of other medical certificate: Secondary | ICD-10-CM

## 2018-03-28 NOTE — Progress Notes (Deleted)
[name] [address] [place], [place] [address] Phone: [phone] Subjective:    I'm seeing this patient by the request  of:    CC:   [id number]  Ian [name] is a 57 y.o. male coming in with complaint of ***  Onset-  Location Duration-  Character- Aggravating factors- Reliving factors-  Therapies tried-  Severity-     Past Medical History:  Diagnosis Date  . History of kidney stones    passed  . PD (Parkinson's disease) ([facility]) 09/10/2013  . PONV (postoperative nausea and vomiting)    nausea    Past Surgical History:  Procedure Laterality Date  . COLONOSCOPY    . MINOR PLACEMENT OF FIDUCIAL N/A 01/31/2017   Procedure: MINOR PLACEMENT OF FIDUCIAL;  Surgeon: Maeola Harman, MD;  Location: [facility] OR;  Service: Neurosurgery;  Laterality: N/A;  Fiducial placement  . none    . PULSE GENERATOR IMPLANT Right 02/17/2017   Procedure: Right implantable pulse generator placement;  Surgeon: Maeola Harman, MD;  Location: [facility] OR;  Service: Neurosurgery;  Laterality: Right;  Bilateral implantable pulse generator placement  . SUBTHALAMIC STIMULATOR INSERTION Bilateral 02/10/2017   Procedure: Bilateral Deep brain stimulator placement;  Surgeon: Maeola Harman, MD;  Location[facility] OR;  Service: Neurosurgery;  Laterality: Bilateral;  Bilateral Deep brain stimulator placement  . UPPER GASTROINTESTINAL ENDOSCOPY     Social History   Socioeconomic History  . Marital status: Married    Spouse name: Not on file  . Number of children: Not on file  . Years of education: Not on file  . Highest education level: Not on file  Occupational History  . Not on file  Social Needs  . Financial resource strain: Not on file  . Food insecurity:    Worry: Not on file    Inability: Not on file  . Transportation needs:    Medical: Not on file    Non-medical: Not on file  Tobacco Use  . Smoking status: Never Smoker  . Smokeless tobacco: Never Used  Substance and Sexual  Activity  . Alcohol use: Yes    Comment: Occ  . Drug use: No  . Sexual activity: Not on file  Lifestyle  . Physical activity:    Days per week: Not on file    Minutes per session: Not on file  . Stress: Not on file  Relationships  . Social connections:    Talks on phone: Not on file    Gets together: Not on file    Attends religious service: Not on file    Active member of club or organization: Not on file    Attends meetings of clubs or organizations: Not on file    Relationship status: Not on file  Other Topics Concern  . Not on file  Social History Narrative  . Not on file   Allergies  Allergen Reactions  . No Known Allergies    Family History  Problem Relation Age of Onset  . Diabetes Other   . Cancer Other      Past medical history, social, surgical and family history all reviewed in electronic medical record.  No pertanent information unless stated regarding to the chief complaint.   Review of Systems:Review of systems updated and as accurate as of 03/28/18  No headache, visual changes, nausea, vomiting, diarrhea, constipation, dizziness, abdominal pain, skin rash, fevers, chills, night sweats, weight loss, swollen lymph nodes, body aches, joint swelling, muscle aches, chest pain, shortness of breath,  mood changes.   Objective  There were no vitals taken for this visit. Systems examined below as of 03/28/18   General: No apparent distress alert and oriented x3 mood and affect normal, dressed appropriately.  HEENT: Pupils equal, extraocular movements intact  Respiratory: Patient's speak in full sentences and does not appear short of breath  Cardiovascular: No lower extremity edema, non tender, no erythema  Skin: Warm dry intact with no signs of infection or rash on extremities or on axial skeleton.  Abdomen: Soft nontender  Neuro: Cranial nerves II through XII are intact, neurovascularly intact in all extremities with 2+ DTRs and 2+ pulses.  Lymph: No  lymphadenopathy of posterior or anterior cervical chain or axillae bilaterally.  Gait normal with good balance and coordination.  MSK:  Non tender with full range of motion and good stability and symmetric strength and tone of shoulders, elbows, wrist, hip, knee and ankles bilaterally.     Impression and Recommendations:     This case required medical decision making of moderate complexity.      Note: This dictation was prepared with Dragon dictation along with smaller phrase technology. Any transcriptional errors that result from this process are unintentional.

## 2018-04-10 NOTE — Progress Notes (Signed)
[name] Deniyah Dillavou D.O. [facility] [address] [place], [place] [address] Phone: [phone] Subjective:     CC: Back pain follow-up  ZHY:[id number]:Subjective  Ian PurserKenneth [name] is a 57 y.o. male coming in with complaint of back pain.  Patient was seen previously.  He does have some symptoms of changes but does have muscle tightness more secondary to the Parkinson's.  Has been doing relatively well.  Patient will recently did have a stomach virus and since then has not been working out as much.  Increasing discomfort and tightness of the back.  No radicular symptoms.     Past Medical History:  Diagnosis Date  . History of kidney stones    passed  . PD (Parkinson's disease) (HCC) 09/10/2013  . PONV (postoperative nausea and vomiting)    nausea    Past Surgical History:  Procedure Laterality Date  . COLONOSCOPY    . MINOR PLACEMENT OF FIDUCIAL N/A 01/31/2017   Procedure: MINOR PLACEMENT OF FIDUCIAL;  Surgeon: [name], MD;  [facility];  Service: Neurosurgery;  Laterality: N/A;  Fiducial placement  . none    . PULSE GENERATOR IMPLANT Right 02/17/2017   Procedure: Right implantable pulse generator placement;  Surgeon: [name], MD;  [facility];  Service: Neurosurgery;  Laterality: Right;  Bilateral implantable pulse generator placement  . SUBTHALAMIC STIMULATOR INSERTION Bilateral 02/10/2017   Procedure: Bilateral Deep brain stimulator placement;  Surgeon: [name], MD;  [facility];  Service: Neurosurgery;  Laterality: Bilateral;  Bilateral Deep brain stimulator placement  . UPPER GASTROINTESTINAL ENDOSCOPY     Social History   Socioeconomic History  . Marital status: Married    Spouse name: Not on file  . Number of children: Not on file  . Years of education: Not on file  . Highest education level: Not on file  Occupational History  . Not on file  Social Needs  . Financial resource strain: Not on file  . Food insecurity:    Worry: Not on file   Inability: Not on file  . Transportation needs:    Medical: Not on file    Non-medical: Not on file  Tobacco Use  . Smoking status: Never Smoker  . Smokeless tobacco: Never Used  Substance and Sexual Activity  . Alcohol use: Yes    Comment: Occ  . Drug use: No  . Sexual activity: Not on file  Lifestyle  . Physical activity:    Days per week: Not on file    Minutes per session: Not on file  . Stress: Not on file  Relationships  . Social connections:    Talks on phone: Not on file    Gets together: Not on file    Attends religious service: Not on file    Active member of club or organization: Not on file    Attends meetings of clubs or organizations: Not on file    Relationship status: Not on file  Other Topics Concern  . Not on file  Social History Narrative  . Not on file   Allergies  Allergen Reactions  . No Known Allergies    Family History  Problem Relation Age of Onset  . Diabetes Other   . Cancer Other      Past medical history, social, surgical and family history all reviewed in electronic medical record.  No pertanent information unless stated regarding to the chief complaint.   Review of Systems:Review of systems updated and as accurate as of 04/11/18  No headache, visual changes, nausea, vomiting, diarrhea, constipation, dizziness, abdominal pain, skin rash, fevers, chills, night sweats, weight loss, swollen lymph nodes, body aches, joint swelling, muscle aches, chest pain, shortness of breath, mood changes.   Objective  Blood pressure 104/72, pulse 86, height 6\' 2"  (1.88 m), weight 163 lb (73.9 kg), SpO2 97 %. Systems examined below as of 04/11/18   General: No apparent distress alert and oriented x3 mood and affect normal, dressed appropriately.  Mild masked facies HEENT: Pupils equal, extraocular movements intact  Respiratory: Patient's speak in full sentences and does not appear short of breath  Cardiovascular: No lower extremity edema, non tender, no  erythema  Skin: Warm dry intact with no signs of infection or rash on extremities or on axial skeleton.  Abdomen: Soft nontender  Neuro: Cranial nerves II through XII are intact, neurovascularly intact in all extremities with 2+ DTRs and 2+ pulses.  Lymph: No lymphadenopathy of posterior or anterior cervical chain or axillae bilaterally.  Gait normal with good balance and coordination.  MSK:  Non tender with full range of motion and good stability and symmetric strength and tone of shoulders, elbows, wrist, hip, knee and ankles bilaterally.  Mild left lower extremities sting tremor and mild cogwheeling notedExam:  Inspection: Loss of lordosis Motion: Flexion 40deg, Extension 25 deg, Side Bending to 35 deg bilaterally,  Rotation to 35 deg bilaterally  SLR laying: Negative  XSLR laying: Negative  Palpable tenderness: Tender to palpation in the paraspinal stricture. FABER: Positive Faber bilaterally. Sensory change: Gross sensation intact to all lumbar and sacral dermatomes.  Reflexes: 2+ at both patellar tendons, 2+ at achilles tendons, Babinski's downgoing.  Strength at foot  Plantar-flexion: 5/5 Dorsi-flexion: 5/5 Eversion: 5/5 Inversion: 5/5  Leg strength  Quad: 5/5 Hamstring: 5/5 Hip flexor: 5/5 Hip abductors: 4/5 but symmetric Gait unremarkable.     Osteopathic findings C6 flexed rotated and side bent left T3 extended rotated and side bent right inhaled third rib T6 extended rotated and side bent left L3 flexed rotated and side bent right Sacrum right on right     Impression and Recommendations:     This case required medical decision making of moderate complexity.      Note: This dictation was prepared with Dragon dictation along with smaller phrase technology. Any transcriptional errors that result from this process are unintentional.

## 2018-04-11 ENCOUNTER — Ambulatory Visit (INDEPENDENT_AMBULATORY_CARE_PROVIDER_SITE_OTHER): Payer: Medicare Other | Admitting: Family Medicine

## 2018-04-11 ENCOUNTER — Encounter: Payer: Self-pay | Admitting: Family Medicine

## 2018-04-11 VITALS — BP 104/72 | HR 86 | Ht 74.0 in | Wt 163.0 lb

## 2018-04-11 DIAGNOSIS — M999 Biomechanical lesion, unspecified: Secondary | ICD-10-CM | POA: Diagnosis not present

## 2018-04-11 DIAGNOSIS — M940 Chondrocostal junction syndrome [Tietze]: Secondary | ICD-10-CM | POA: Diagnosis not present

## 2018-04-11 NOTE — Assessment & Plan Note (Signed)
Stable.  Discussed HEP  Discussed which activities to do  RTC in 4-8 weeks

## 2018-04-11 NOTE — Patient Instructions (Signed)
Good to see you  Ian [name] is your friend.  Stay active See me again in 1-2 months

## 2018-04-11 NOTE — Assessment & Plan Note (Signed)
Decision today to treat with OMT was based on Physical Exam  After verbal consent patient was treated with HVLA, ME, FPR techniques in cervical, thoracic, rib,  lumbar and sacral areas  Patient tolerated the procedure well with improvement in symptoms  Patient given exercises, stretches and lifestyle modifications  See medications in patient instructions if given  Patient will follow up in 4-8 weeks 

## 2018-04-13 ENCOUNTER — Other Ambulatory Visit: Payer: Self-pay | Admitting: Neurology

## 2018-04-13 MED ORDER — ROPINIROLE HCL ER 2 MG PO TB24: 2.0000 mg | ORAL_TABLET | Freq: Every day | ORAL | 1 refills | Status: DC

## 2018-05-07 ENCOUNTER — Encounter: Payer: Self-pay | Admitting: Neurology

## 2018-05-08 MED ORDER — CARBIDOPA-LEVODOPA ER 36.25-145 MG PO CPCR: 1.0000 | ORAL_CAPSULE | ORAL | 0 refills | Status: DC

## 2018-05-10 ENCOUNTER — Encounter: Payer: Self-pay | Admitting: Neurology

## 2018-05-10 MED ORDER — CARBIDOPA-LEVODOPA ER 36.25-145 MG PO CPCR: 1.0000 | ORAL_CAPSULE | ORAL | 0 refills | Status: DC

## 2018-05-15 ENCOUNTER — Ambulatory Visit: Payer: Medicare Other | Attending: Neurology

## 2018-05-15 DIAGNOSIS — R471 Dysarthria and anarthria: Secondary | ICD-10-CM | POA: Insufficient documentation

## 2018-05-15 NOTE — Therapy (Signed)
[facility] [address] [place], [place], [address] Phone: [phone]   Fax:  [phone]  Patient Details  Name: Ian [name] MRN: [id number] Date of Birth: 12-09-1961 Referring Provider:  Vladimir Faster, DO  Encounter Date: 05/15/2018  Speech Therapy Parkinson's Disease Screen   Pt's speech rate appears intermittently fast in 10 minutes of conversation, however pt states, "I'm still very cognizant of my speech. I can tell when I'm speeding up and adjust accordingly." Decibel Level today: low 70s dB  (WNL=70-72 dB) with sound level meter 30cm away from pt's mouth. Pt does not think an evaluation for speech is necessary at this time and would like to proceed with screens scheduled in November 2019. SLP agrees.  Pt has not experienced difficulty in swallowing warranting further evaluation.  Pt does does not require speech therapy services at this time. Recommend ST screen in another 6 months. Pt has screens scheduled for November and follow up will be during that time, sooner if the need exists and a prescription is recieved.    [facility] ,MS, CCC-SLP  05/15/2018, 2:10 PM  [facility] [address] [place], [place], [address] Phone: [phone]   Fax:  [phone]

## 2018-05-16 IMAGING — CT CT HEAD W/O CM
1 series · 15 of 30 positions shown, 19 images · non-contrast
Comparison: Brain MRI 11/30/2016.

CLINICAL DATA: 55-year-old male with Parkinson disease undergoing
preoperative planning for deep brain stimulator surgery. Initial
encounter.

EXAM:
CT HEAD WITHOUT CONTRAST
TECHNIQUE: Contiguous axial images were obtained from the base of the skull
through the vertex without intravenous contrast.

[Series 2: dbs pre op · axial · non-contrast · 0.49mm/px · z∈[-586,-430]mm · 15 of 167 slices shown, 19 images]
[im 6/167  brain]
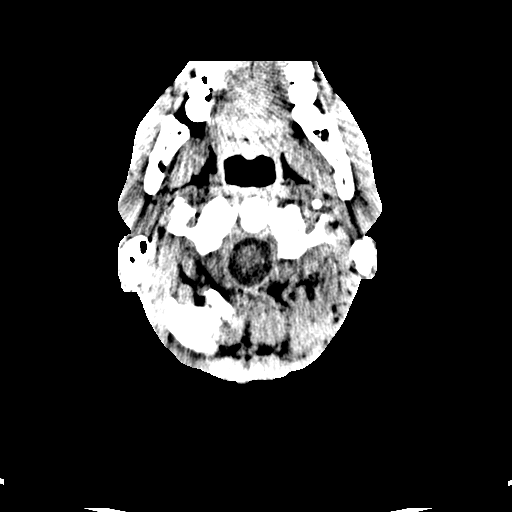
[im 6/167  bone]
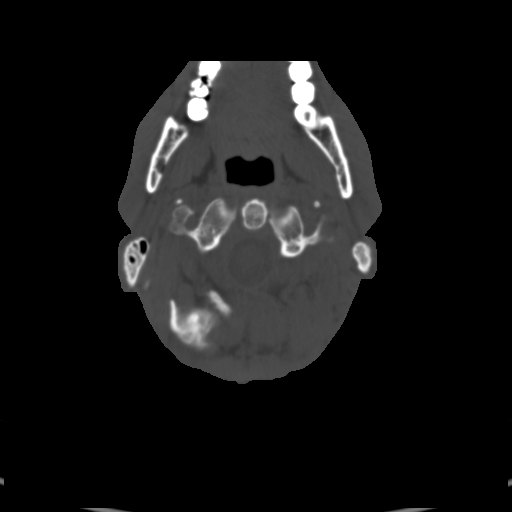
[im 18/167  brain]
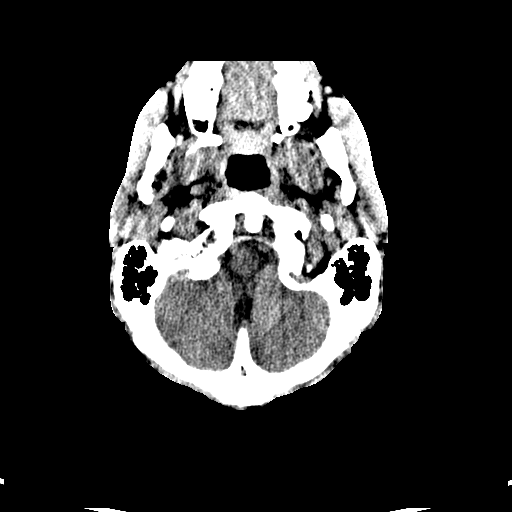
[im 29/167  brain]
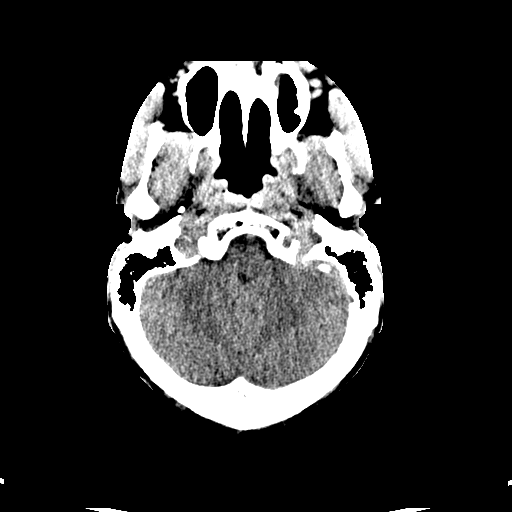
[im 41/167  brain]
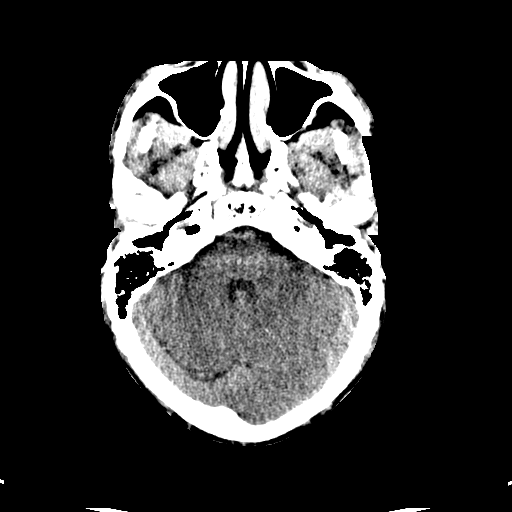
[im 52/167  brain]
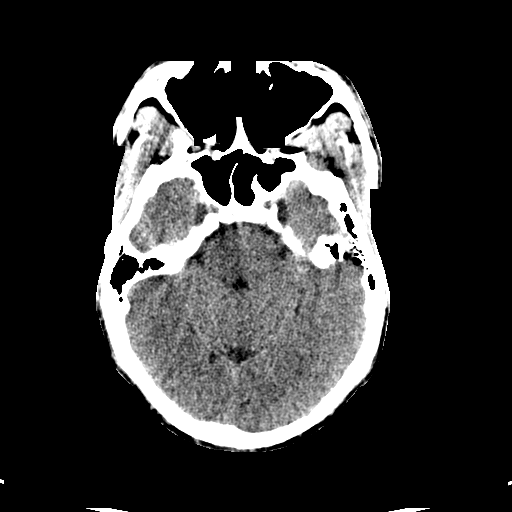
[im 52/167  bone]
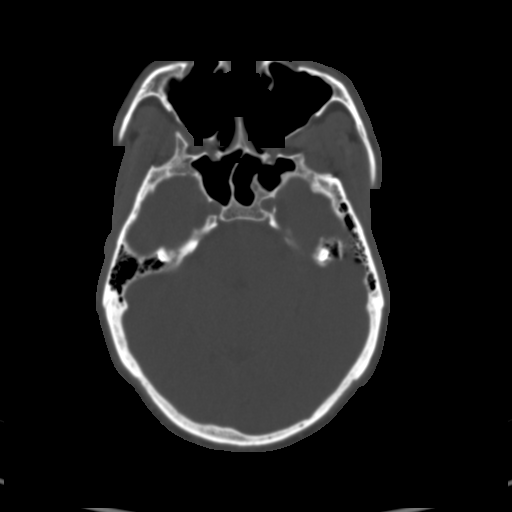
[im 63/167  brain]
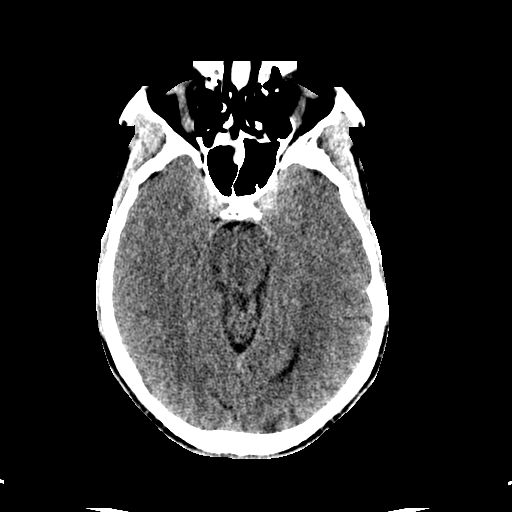
[im 75/167  brain]
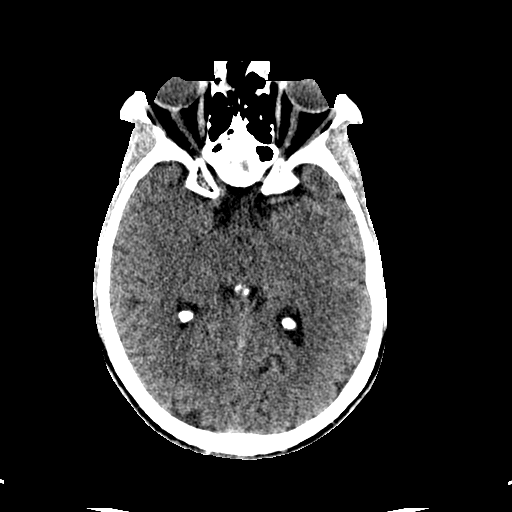
[im 86/167  brain]
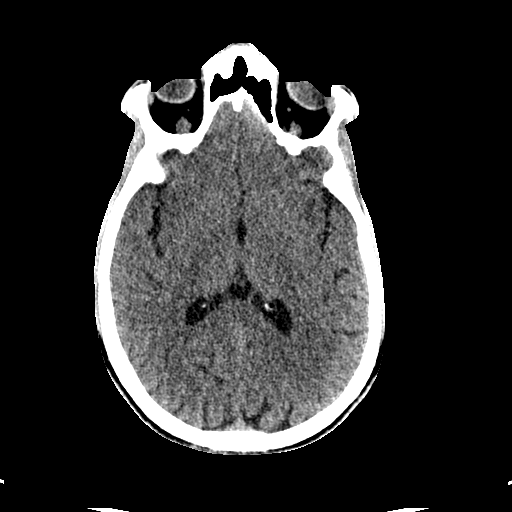
[im 92/167  brain]
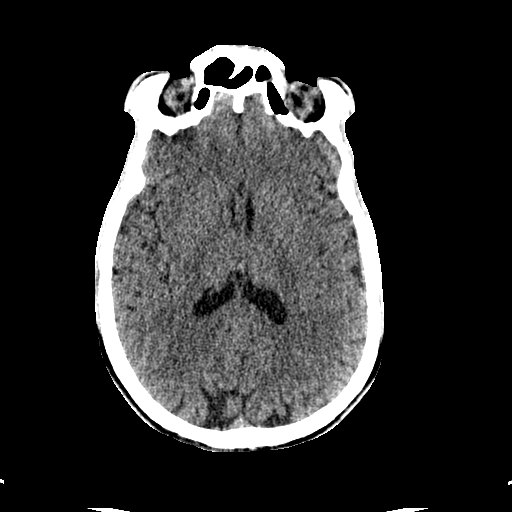
[im 92/167  bone]
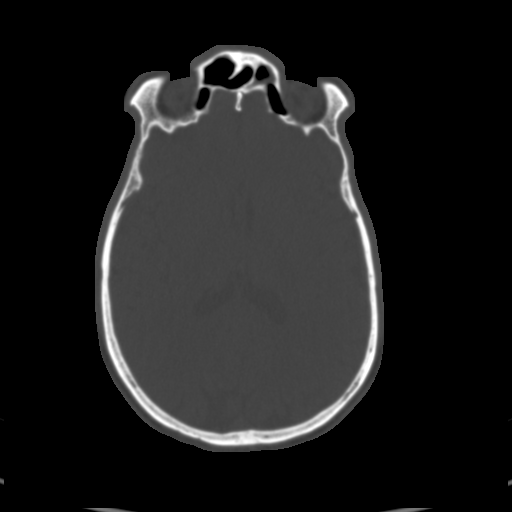
[im 104/167  brain]
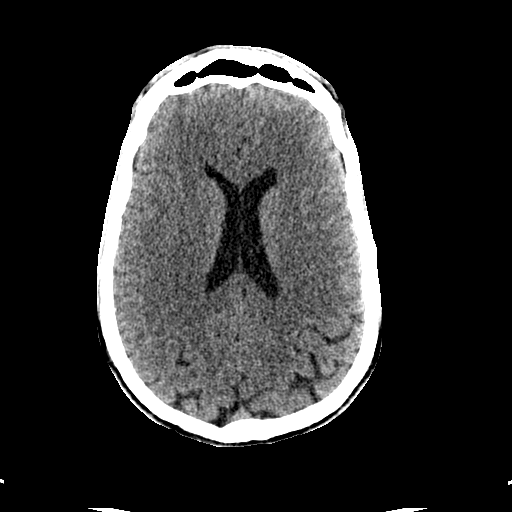
[im 115/167  brain]
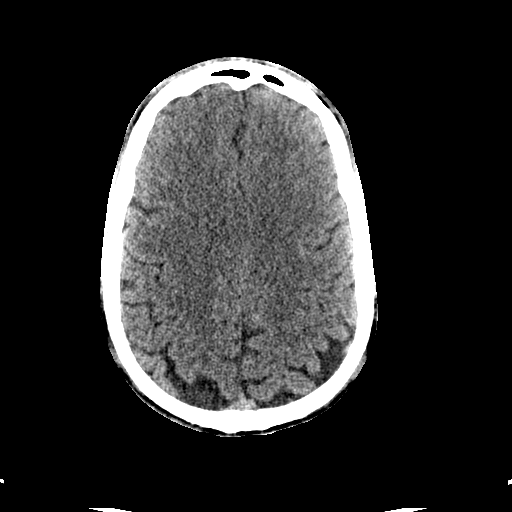
[im 126/167  brain]
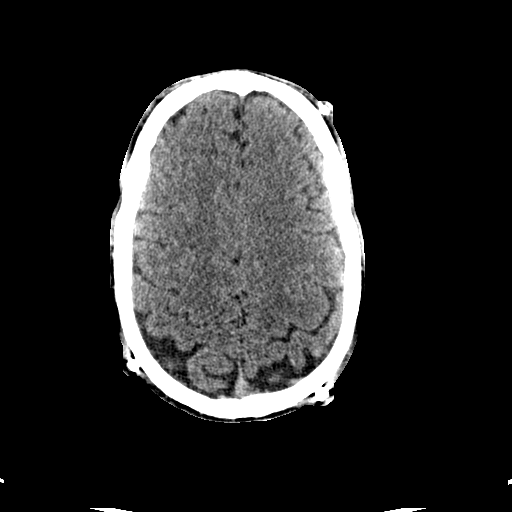
[im 138/167  brain]
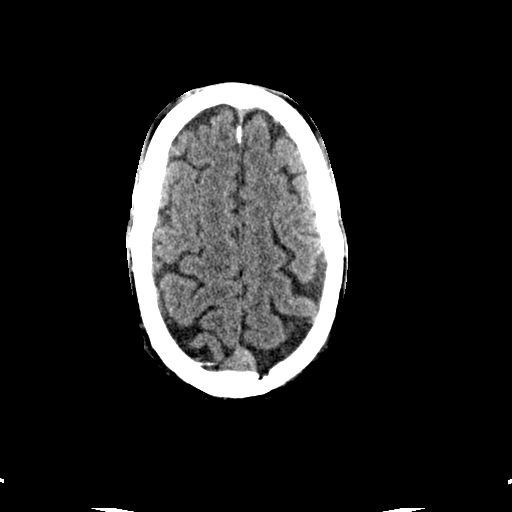
[im 138/167  bone]
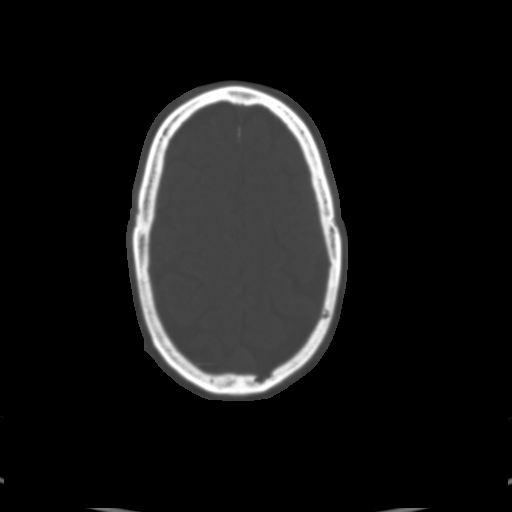
[im 149/167  brain]
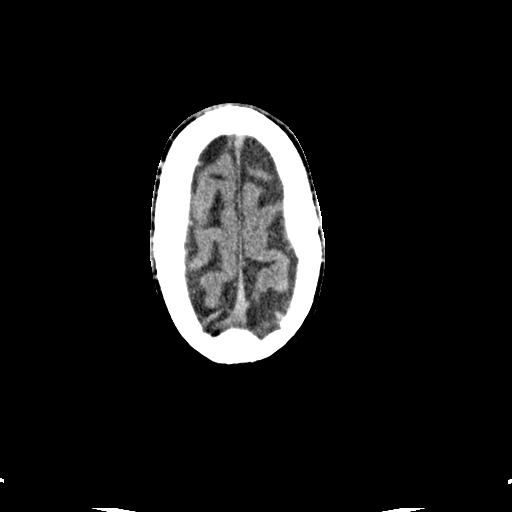
[im 161/167  brain]
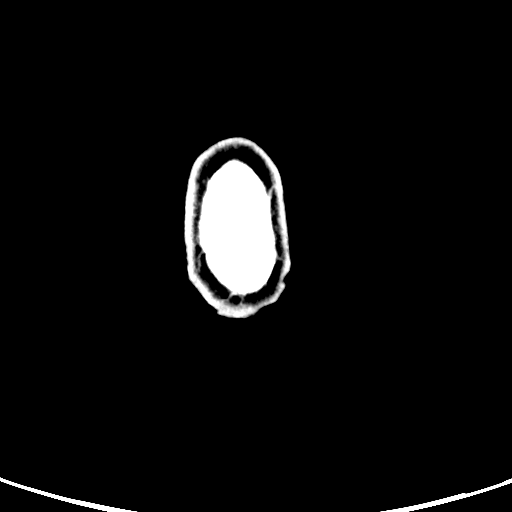

[15 of 30 positions shown; findings below may reference images not displayed]

FINDINGS: Brain: Cerebral volume is within normal limits. No midline shift,
ventriculomegaly, mass effect, evidence of mass lesion, intracranial
hemorrhage or evidence of cortically based acute infarction.
Gray-white matter differentiation is within normal limits throughout
the brain.

Vascular: No suspicious intracranial vascular hyperdensity.

Skull: A total of 4 anteriorly and posteriorly situated skull
fiducials are in place. No other acute osseous finding.

Sinuses/Orbits: Scattered small polypoid areas of mucosal thickening
in the ethmoid sinuses and right sphenoid sinus. The nasal cavity
appears relatively spared. Mild left greater than right
frontoethmoidal recess mucosal thickening. Visible maxillary
sinuses, bilateral tympanic cavities and mastoids are clear.

Other: Mild scalp hematomas at the site of the skull fiducials.
Negative orbits soft tissues.
IMPRESSION: 1. Study for stereotactic preoperative planning. Skull fiducials in
place.
2.  Normal non contrast CT appearance of the brain.

## 2018-05-23 NOTE — Progress Notes (Signed)
[name] Kohan Azizi D.O. [facility] [address] [place], [place] [address] Phone: [phone] Subjective:     CC: Back and neck pain follow-up  VHQ:[id number]:Subjective  Laymond PurserKenneth Intrieri is a 57 y.o. male coming in with complaint of neck and neck pain follow-up.  Patient does have a history of Parkinson's.  Has noticed some increasing discomfort and pain recently.  Patient has had some increasing stress recently as well.  Thinks this is contributing.  Been working out a lot more and has lost some weight.     Past Medical History:  Diagnosis Date  . History of kidney stones    passed  . PD (Parkinson's disease) (HCC) 09/10/2013  . PONV (postoperative nausea and vomiting)    nausea    Past Surgical History:  Procedure Laterality Date  . COLONOSCOPY    . MINOR PLACEMENT OF FIDUCIAL N/A 01/31/2017   Procedure: MINOR PLACEMENT OF FIDUCIAL;  Surgeon: [name], MD;  [facility];  Service: Neurosurgery;  Laterality: N/A;  Fiducial placement  . none    . PULSE GENERATOR IMPLANT Right 02/17/2017   Procedure: Right implantable pulse generator placement;  Surgeon: [name], MD;  [facility];  Service: Neurosurgery;  Laterality: Right;  Bilateral implantable pulse generator placement  . SUBTHALAMIC STIMULATOR INSERTION Bilateral 02/10/2017   Procedure: Bilateral Deep brain stimulator placement;  Surgeon: [name], MD;  [facility];  Service: Neurosurgery;  Laterality: Bilateral;  Bilateral Deep brain stimulator placement  . UPPER GASTROINTESTINAL ENDOSCOPY     Social History   Socioeconomic History  . Marital status: Married    Spouse name: Not on file  . Number of children: Not on file  . Years of education: Not on file  . Highest education level: Not on file  Occupational History  . Not on file  Social Needs  . Financial resource strain: Not on file  . Food insecurity:    Worry: Not on file    Inability: Not on file  . Transportation needs:    Medical: Not on file     Non-medical: Not on file  Tobacco Use  . Smoking status: Never Smoker  . Smokeless tobacco: Never Used  Substance and Sexual Activity  . Alcohol use: Yes    Comment: Occ  . Drug use: No  . Sexual activity: Not on file  Lifestyle  . Physical activity:    Days per week: Not on file    Minutes per session: Not on file  . Stress: Not on file  Relationships  . Social connections:    Talks on phone: Not on file    Gets together: Not on file    Attends religious service: Not on file    Active member of club or organization: Not on file    Attends meetings of clubs or organizations: Not on file    Relationship status: Not on file  Other Topics Concern  . Not on file  Social History Narrative  . Not on file   Allergies  Allergen Reactions  . No Known Allergies    Family History  Problem Relation Age of Onset  . Diabetes Other   . Cancer Other      Past medical history, social, surgical and family history all reviewed in electronic medical record.  No pertanent information unless stated regarding to the chief complaint.   Review of Systems:Review of systems updated and as accurate as of 05/25/18  No headache, visual changes, nausea, vomiting, diarrhea, constipation,  dizziness, abdominal pain, skin rash, fevers, chills, night sweats, weight loss, swollen lymph nodes, body aches, joint swelling, chest pain, shortness of breath, mood changes.  Mild positive muscle aches  Objective  Blood pressure 110/64, pulse 68, height 6\' 2"  (1.88 m), weight 163 lb (73.9 kg), SpO2 97 %. Systems examined below as of 05/25/18   General: No apparent distress alert and oriented x3 mood and affect normal, dressed appropriately.  HEENT: Pupils equal, extraocular movements intact  Respiratory: Patient's speak in full sentences and does not appear short of breath  Cardiovascular: No lower extremity edema, non tender, no erythema  Skin: Warm dry intact with no signs of infection or rash on  extremities or on axial skeleton.  Abdomen: Soft nontender  Neuro: Cranial nerves II through XII are intact, neurovascularly intact in all extremities with 2+ DTRs and 2+ pulses.  Lymph: No lymphadenopathy of posterior or anterior cervical chain or axillae bilaterally.  Gait normal with good balance and coordination.  MSK:  Non tender with full range of motion and good stability and symmetric strength and tone of shoulders, elbows, wrist, hip, knee and ankles bilaterally.  Very mild resting tremor noted in the right lower extremity Neck exam shows some mild increase in stiffness.  Lacks last 5 degrees of extension.  Tenderness to palpation diffusely. Back exam also shows increasing diagnosis with loss of lordosis.  Mild loss of extension of the back of 5 degrees.  Otherwise full range of motion.  Tenderness in the lumbosacral area bilaterally.  Osteopathic findings  C6 flexed rotated and side bent left T3 extended rotated and side bent right inhaled third rib T5 extended rotated and side bent left L3 flexed rotated and side bent right Sacrum right on right      Impression and Recommendations:     This case required medical decision making of moderate complexity.      Note: This dictation was prepared with Dragon dictation along with smaller phrase technology. Any transcriptional errors that result from this process are unintentional.

## 2018-05-25 ENCOUNTER — Ambulatory Visit (INDEPENDENT_AMBULATORY_CARE_PROVIDER_SITE_OTHER): Payer: Medicare Other | Admitting: Family Medicine

## 2018-05-25 ENCOUNTER — Encounter: Payer: Self-pay | Admitting: Family Medicine

## 2018-05-25 VITALS — BP 110/64 | HR 68 | Ht 74.0 in | Wt 163.0 lb

## 2018-05-25 DIAGNOSIS — M999 Biomechanical lesion, unspecified: Secondary | ICD-10-CM | POA: Diagnosis not present

## 2018-05-25 DIAGNOSIS — M940 Chondrocostal junction syndrome [Tietze]: Secondary | ICD-10-CM

## 2018-05-25 NOTE — Assessment & Plan Note (Signed)
Continues to have some muscle tightness.  Do feel that underlying stress is contributing.  Patient does not want any medications at this time.  We discussed regimen, proper lifting mechanics, meditation and other conservative therapies.  Follow-up with me again in 3 to 4 weeks

## 2018-05-25 NOTE — Patient Instructions (Signed)
Good to see you  Ian [name] is your friend.  Stay active Watch the weight  See me again in 3 weeks

## 2018-05-25 NOTE — Assessment & Plan Note (Signed)
Decision today to treat with OMT was based on Physical Exam  After verbal consent patient was treated with HVLA, ME, FPR techniques in cervical, thoracic, rib, lumbar and sacral areas  Patient tolerated the procedure well with improvement in symptoms  Patient given exercises, stretches and lifestyle modifications  See medications in patient instructions if given  Patient will follow up in 3-4 weeks 

## 2018-05-31 ENCOUNTER — Encounter: Payer: Self-pay | Admitting: Neurology

## 2018-06-01 NOTE — Progress Notes (Signed)
Ian [name] was seen today in the movement disorders clinic for neurologic consultation at the request of his PCP, Dr. Drue [name].  He has previously seen  Patient, Ian [name] and saw Dr. Edwin [name] in [place].   This patient is accompanied in the office by his spouse who supplements the history.The consultation is for the evaluation of PD.  I did review Dr. Carloyn [name] notes.  Dr. Hurley [name] notes are not available to me.    Hist first sx was tremor was R arm tremor.  It was in 2011 according to the patient today, but Dr. Faye [name] notes indicate that perhaps this was in 2009.  He was dx with enhanced physiologic tremor.  He was started on propranolol with some help, but made him sluggish.  He went to see Dr. Edwin [name] in 2013 for an opinion in [place].  Dr. 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  He was placed on baclofen and requip.  He thinks that the requip has helped; he is able to turn over in the bed better and move better.  The pt first saw Dr. Rubin [name] on 09/10/13.  Dr. Rubin [name] agreed with the Requip and start the patient on [name], but the patient is currently only on its once [name] day.  12/04/13 update:  The pt presents to the clinic today for his PD.  He is exercising faithfully.  He attends the PD exercise class through the rehab center. The pt was changed to the IR formulation of carbidopa/levodopa 25/100 last visit and is on it tid.  He is not sure it helps.  Afternoons are better than mornings.  carbidopa/levodopa 25/100 is taken at 8am/3:30 pm and bedtime. He is also on requip xl 6 mg daily.  Continues to have significant tremor.  01/27/14 update:  The patient presents today with his wife, who supplements the history.  The pt has a hx of PD and last visit, we decided to increase his carbidopa/levodopa to 2 po tid.  the patient states that he just felt lightheaded on this medication  dosage and up going back to [name] 3 times [name] day and then takes [name], half tablet twice [name] day. He remains on requip xl 6 mg daily.  Overall, he does feel fairly good.  He still has tremor.  He went to see Dr. Rubin [name] since our last visit and again talked about DBS therapy.  Dr. Rubin [name] mentioned to him that he looks much better and he had previously and had [name] score had declined.  Some yelling out in the sleep but this has not been a huge issue.  Ian sleepwalking/talking.  Ian falls.  He is exercising faithfully.  05/28/14 update:  The patient is presenting today for followup.  Last visit, I increased his [name] from 6 mg daily to 4 mg twice a day.  He is doing well with this.  Tremor is about the same.  He is on a strange combination of carbidopa levodopa, but when I tried to switch him off the IR version, the patient just felt lightheaded and ended up going back to a combination of IR and CR.  He is currently on 25/100 CR 3 times [name] day and then takes [name], half tablet twice [name] day and sometimes three times [name] day.  He does best in the evening and worst in the AM.   Ian swallowing troubles.  Has dropped weight but thinks that it is exercise and was trying to follow gluten free diet. He is walking, doing a spinning class for PD, and has started golfing again, which he hasn't done in years.   Still considering DBS therapy.    08/27/14 update:  Patient presents today for followup.  He is currently on [name], 4mg  twice a day.  He is on a strange combination of carbidopa levodopa, but hasn't been able to tolerate the IR version well.[name]  He is currently on 25/100 CR 2-3 times [name] day and then 50/200 at night.  He rarely takes the IR now; only if playing golf or if he needs a boost during the day.  He is finding that he is having more tremor.  He is also having dreams at night that are making his sleep feel unrestful.  He is strongly  considering deep brain stimulation.  He asks me about details regarding this today.  He has asks me about potentially getting a refill on his Ativan.  He apparently got this filled from an outside physician about a year and a half ago and only had 10 pills in total and is just now running out.  He takes one pill every 3 or 4 months if he has to go to a function with his wife.  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  He asks me if he can have a refill today.  09/10/14 update:  The patient is seen today unexpectedly.  Last visit, I started [name] 145 mg, 2 tablets 3 times [name] day.  He is also on [name] 4 mg twice a day.  He called me initially to tell me that the [name] was working great and he felt much better on the medication.  In fact, he states that his PT and OT reported that he looked much better and his eval scores were markedly improved and they asked him what he had done differently.  He states last week was just a great week.  On Saturday, he was just a little lightheaded and his wife checked his orthostatics and he states that they were negative.  This week, however, he began to report some abnormal movements.  The movements were primarily of the left shoulder and in the abdomen. I wanted to see him on this medication before added or changed anything, as I have never seen him with dyskinesia.  Unfortunately, this morning he only took one of his tablets, but he did take 2 tablets at lunch time about 1 hour before coming.  He has not noticed the movements today.  He has noticed much less tremor overall since starting [name].  11/25/14 update:  The patient returns today for follow-up.  He is on [name] 4 mg twice a day and [name] 145 mg, 2 tablets 3 times [name] day.  His biggest issue is tremor, but states that its under good control today (but he just exercised).  He has developed tendinitis in the elbow.  He has clonazepam for REM behavior  disorder but admits he doesn't use it.  The patient remains very active in our Parkinson's exercise classes.  Ian falls.  Some days he does have to "fight" the posture and the "shuffling."  He didn't ever get to see Dr. Venetia Maxon as he states that his life got hectic moving his mother in law.    02/20/15 update:  The patient returns today, accompanied by his  wife who supplements the history.  Last visit, I increased his [name] to 195 mg, and he is taking 2 tablets 3 times a day.  He is also on [name] 4 mg twice a day.  He reports that he has not been doing as well because stress levels have been increased.  His mother-in-law, who is living with them, died unexpectedly.  Because of that, he has had more trouble sleeping as well.  He is still faithfully attending exercise classes and is involved in the Parkinson's community.  He has noticed more rigidity and tremor.  He is taking melatonin, 5 Mill grams at night to help him sleep but does not want to use clonazepam, even though he has at home.  05/24/25 update:  The patient returns today for follow-up.  His wife accompanies him and supplements the history.  He is on [name] 4 mg twice a day and [name] 195 mg, 2 tablets 3 times [name] day.  He has not wanted to increase the dose, although he likely could use more levodopa. States that he is more tremulous right now and attributes that to the fact that he has had a GI illness recently, which made sx's worse.  He has had emesis (one episode).  Ian diarrhea.  Ian fevers.    He has not had any falls.  He denies any hallucinations.  Ian confusion.  Ian lightheadedness or near syncope.  He continues to exercise faithfully.  06/12/15 update:  The patient is following up today, accompanied by his wife who supplements the history.  When I saw him last on 05/22/15, he was obviously underdosed and I expressed the need for more levodopa but the pt didn't want to increase it as he felt that his viral gastroenteritis was just causing his  worsening sx's.  He didn't tell me then that he had been off of his meds for 3 days and that is the reason he didn't look well.  They called me on 06/10/2015 however to state that he was doing much worse.  They wanted me to work him in that day, but I was not able to.  I didn't realize that he had been off all his PD meds since June 1 and had started receiving care from [place] natureopath in [place], Dr. Beckie BusingUrban.  He was given information on Dr. Beckie BusingUrban by his dentist Dr. Mellody DanceKeith, who told him he should try amino acid therapy.  He researched and started seeing Dr. Beckie BusingUrban and was told he needed to be off all his PD meds.  He started on D5-mucuna which markets itself as a "safe, natural" levodopa therapy.  He was told that he needed 4-6 months to see a response, but admits that he was told to tell his doctor what he was doing.  He was increasing the dose of this supplement based on urine dopamine levels which they were told were low.  They apparently were also measuring urine serotonin levels.  Not surprisingly, the patient decompensated.  He states that initially he didn't "feel that bad" but the "tremor was awful."  He also states that he had terrible vivid dreams and ultimately became exhausted from not sleeping, shaking and increased rigidity. It got so bad that he ended up in the ER 2 days ago.    He is back on rytary 195, 2 po tid.  He is a little quesy since being back on it.  He has only been on it for 2 days.  He states that  he talked with the natureopath and she told him to just "start back on it."  He is off of the D5-mucuna.  He and his wife bring in several articles for me to read today.    09/29/15 update:  The patient is following up today, accompanied by his wife who supplements the history.  He is on [name] 4 mg twice a day.  He reports that he is now taking  [name] 195 mg, 2 tablets 3 times a day along with [name] 145 mg, one tablet 3 times a day.  He spreads it out so that he takes it at 8 AM/3 PM/11 PM.   He does not necessarily halfway he describes as "traditional wearing off" but states that at the end of the dose he just does not feel good.  Overall, the patient states that he has been doing okay but he also feels that things are more unpredictable.  He feels more tired and thinks that he has mild dyskinesia.  When he eats, he is biting his lip in the same place.   He denies any lightheadedness or near syncope.  He denies falls.  When asked about depression, he states that he thinks "depression is a strong word" but admits that he gets frustrated and overwhelmed about the disease but uses exercise as a release.  He has sent me paperwork since last visit for his disability which has been completed.  He asks me about writing another letter regarding his VA disability as well.  12/30/15 update:  The patient is following up today.  Last visit, I increased his [name] to 6 mg twice a day.  His [name] has been slightly increased, so that he is on 195 mg, 3 tablets 3 times [name] day and he added an additional 1 tablet at night because he was awakening at 4 AM with tremor. He has changed that again, but taking the same overall dose of rytary, but is now taking 195, 3 in the AM, 2 in the afternoon, 3 in the evening and then takes 2 at bedtime.   Overall, he thinks he feels that it is a little inconsistent; sometimes he does well and sometimes he doesn't.  He continues to faithfully exercise.  Ian falls.  Ian hallucinations.  Ian syncope but has had some lightheadedness; he has had a uri and isn't sure if it is related to that/meds for that.  Mood has been good.  Sleep has been better since he has been back on his medications.  Appetite has been good.  Ian new medical issues.  He is still going to integrative therapies and thinks that it is helpful.  However, he did some therapy called alpha stim and was told that it changed the alpha waves in the brain and he didn't think that it was helpful and thinks that it made him feel  strange.  Feels that he has minor dyskinesia, especially of the arm when he walks or bends over.  Asks about DBS therapy and thinks he is going to consider that more this year.    03/03/16 update:  The patient is following up today.  He is on [name], 6 mg twice a day and [name] 195 mg on the following schedule: 3/3/3/2.  This is a little higher than last visit (prior 3/2/3/2).  He denies any falls since last visit.  He denies lightheadedness or near syncope.  Ian hallucinations.  Tremor has been pretty good, but it will fluctuate throughout the  day (states that he did 2 workouts today).  He does occasionally feel like he has some dyskinesia.  He saw Dr. [name] on February 6 and March 13 and I reviewed those records.  OMT was performed.  The patient feels that that was helpful for his musculoskeletal complaints.  He is doing lots of exercises.  Mood is stable.  Asks about meeting with Dr. Venetia Maxon for DBS but wife not ready to pursue.    06/24/16 update:  The patient is following up today, accompanied by his wife who supplements the history.    He is here for her levodopa challenge test.  He saw Dr. Venetia Maxon in May and is interested in DBS therapy.  Last visit, I increased his [name] so that he is taking 4 mg, 2 tablets twice a day.  He remains on [name] 195 mg on the following schedule: 3/3/3/2.  He takes clonazepam as needed for REM behavior disorder.  He has not had falls since last visit.  He has seen Dr. [name] several times and I reviewed those records.  He has not had any lightheadedness or near syncope.  He continues to exercise faithfully.  10/18/16 update:  Pt f/u today.  Wife doesn't accompany today.  He saw Dr. Alinda Dooms since our last visit and Ian evidence of cognitive deficits.  Pt on [name] 4 mg - 2 po bid and rytary 195 mg: and he has changed that from 3/3/3/2 to 4/3/3/3.  He has a little more off time with more tremor and slowness.  In late summer, he was putting a putting green into his  back yard and states that he tore an intercostal muscle so he couldn't exercise for 8-9 weeks because of pain with breathing.  Started back to working out 3-4 weeks ago.   01/14/16 update:  Patient follows up today, as he has some questions regarding DBS therapy.  Since our last visit, the [facility] device for deep brain stimulation has been FDA approved.  The patient and I discussed the device over the telephone, and he subsequently read about the device and thought that he would prefer to have this particular device and presents today to talk about it in more detail.  He remains on [name], 4 mg, and takes 2 tablets twice [name] day.  He also takes [name] 195 mg: 4/3/3/3.  Last visit, I gave him some carbidopa/levodopa 25/100 to use as needed and he states that he tried that but it didn't work well for him and so he isn't using that much.  01/31/17 update:  Patient follows up today for preop DBS recording.  Wife present and supplements history.  He had fiducials placed today.  He was to hold his meds this AM but didn't and feels like he is "on."    03/13/17 update:  Patient follows up today.  Patient is accompanied by his twin sister and wife who supplements the history.  The patient underwent bilateral STN DBS with [facility] [employer] device on 02/10/2017 and had IPG placement on 02/17/2017.  The patient has recovered nicely postoperatively.  He had some increase dyskinesia and emailed me about that.  I did tell him he could slightly decrease his [name] if he would like.  He states that he has been taking [name] 195, 3 po tid and [name], 4 mg, 2 tablets twice [name] day.  He held his Requip today and last took [name], 3 of them at 8 am.  He has had  Ian falls.  Ian lightheadedness or near syncope.  03/20/17 update:  Patient is seen today back in follow-up.  He is accompanied by his wife who supplements the history.  His device was activated one week ago, but we did see him today after activation as he was  having dyskinesia on the right side of the body.  The patient relates that the dyskinesia seem to be related to taking [name] 195 mg, 2 tablets and 1 [name] 4 mg.  He ended up not taking the medication and the dyskinesia when away.  However, when he was seen back to adjust the programming, he was more rigid, but was reluctant for me to change anything because he Ian longer had the dyskinesia.  We went up slightly on the left hemibody (right brain) and gave him some control over the left brain to go up and down.  He is still having some tremor in his feet.  He states that he is only taking med prn.  He took 1 Requip on Friday, none on Saturday, 1 requip and 1 rytary yesterday.  He feels that he did well yesterday with tremor and slight dyskinesia.  Wife states that she notices that patient has been "melancholy."  Part of this is because of the frustration with balance of tremor and dyskinesia and part of it is because he has not been back to exercising lately.  04/14/17 update:  Patient is seen back today in follow-up.  He has adjusted his DBS some on his own.  He states that he has had some difficulty in balancing symptoms between the left and right side of the body.  He has also been having some challenges with mood.  He was taking carbidopa/levodopa 25/100, 2-3 times [name] day, but this past week he ended up switching to [name] 195 mg and has been taking that 3 times [name] day.  He does think that that has helped mood somewhat.  He is back to exercising.  He has not fallen.  06/23/17 update:  Patient is seen today in follow-up.  He is currently on [name], 145 mg and takes 2 to 3 [name] at 8:00am, 2 [name] at 1:00pm, 2 [name] At 6:00pm and 1 [name] at bedtime.  He has been off and on the Requip.  He initially thought it made him fatigued, but went back to taking the 2 mg tablets and is on the XL.  Thinks that the XL gives him some dyskinesia.   Nonmotor symptoms such as fatigue and apathy have been the biggest  issues.  He really has not wanted to take any antidepressants.  States that he is not depressed and taking supplement of amino acid triptophan and he feels apathy is better.  He has struggled with voice strength.  He has not had falls.  He is back to exercise.  For the first time in a long time, he was able to walk the entire golf course and play a round of golf without a feeling of wearing off of meds.  10/17/17 update: Patient seen today in follow-up for Parkinson's disease, status post DBS surgery.  He is on rytary, 145 mg and takes 2-3 [name] at 8 AM, 2 [name] at 1 PM, 2 [name] at 6 PM and 1 [name] at bedtime.  He is on ropinirole XL, 2 mg daily.  Ian falls.  Ian lightheadedness or near syncope.  Ian hallucinations.  Ian compulsive behaviors.  Ian sleep attacks.  He is doing spin class 2 days  [name] week, lifting weights 4 days [name] week, [employer] classs.  He is busy with church.  Not much dyskinesia.  Not much tremor.  He turned L side down a little.  He is going to [place] with [employer] to tell his story.  He is fatigued but doesn't want medication for that  02/05/18 update: Patient is seen today in follow-up for Parkinson's disease.  Patient emailed me this past Friday to let me know that he has been "experimenting" with how he takes his medication, mixing both [name] with immediate release levodopa.  Today, he tells me that he is currently taking rytary, 145 mg, 2-3 tablets 3 times [name] day and [name] prn (generally one time [name] day).  He reports that the tremor in the left foot is fairly unpredictable and when bad, he uses it as a sign that it is time for medication.  In his email, he also felt that the Requip was not helping, so I told him to go ahead and discontinue that over the weekend and we would address it today.   He ended up stopping it last night as he had stress over the week. He has attended speech therapy.  He also attended the new thrive and connect group for active patients with  Parkinson's disease.  In fact, he will be leading this group.  06/04/18 update: Patient is seen today in follow-up for Parkinson's disease.  He emailed me Friday to give me an update.  Last visit, I told him to go ahead and discontinue the Requip.  He ended up doing that about a week ago.  He has been taking 3 [name], 145 mg, at 8:30 AM, 2 [name] at 1:30 PM.  He takes another 2 [name] at bedtime.  He gets quite fatigued in the mid afternoon, despite starting Provigil in the morning. He states that he actually stopped it because he wasn't sure it was that helpful.     He is noting tremor of the left greater than right foot.  He feels that he is always "behind" with medication but if he takes more than what he is currently, he will have some dyskinesia.  PREVIOUS MEDICATIONS: Requip and neupro (tried few days only); propranolol helped tremor some  ALLERGIES:   Allergies  Allergen Reactions  . Ian Known Allergies     CURRENT MEDICATIONS:   Allergies as of 06/04/2018      Reactions   Ian Known Allergies       Medication List        Accurate as of 06/04/18  3:35 PM. Always use your most recent med list.          carbidopa-levodopa 25-100 MG tablet Commonly known as:  SINEMET IR Take 1 tablet by mouth as needed.   Carbidopa-Levodopa ER 36.25-145 MG Cpcr Commonly known as:  [name] Take 1 tablet by mouth See admin instructions. 3 in the morning, 2 in the afternoon, 2 in the evening, 1 at night   LECITHIN PO Take 1 capsule by mouth daily.   Levodopa 42 MG Caps Commonly known as:  INBRIJA Place 2 capsules into inhaler and inhale 5 (five) times daily as needed.   modafinil 200 MG tablet Commonly known as:  PROVIGIL Take 1 tablet (200 mg total) by mouth 2 (two) times daily. 1 in AM and 1 at noon   MULTI-VITAMINS Tabs Take 1 tablet by mouth 2 (two) times daily.   niacin 500 MG tablet Take 500 mg by  mouth 3 (three) times daily with meals.   vitamin C 1000 MG tablet Take 5,000 mg by  mouth daily.   Vitamin D-3 5000 units Tabs Take 5,000 Units by mouth daily.   vitamin E 400 UNIT capsule Take 400 Units by mouth daily.        PAST MEDICAL HISTORY:   Past Medical History:  Diagnosis Date  . History of kidney stones    passed  . PD (Parkinson's disease) (HCC) 09/10/2013  . PONV (postoperative nausea and vomiting)    nausea     PAST SURGICAL HISTORY:   Past Surgical History:  Procedure Laterality Date  . COLONOSCOPY    . MINOR PLACEMENT OF FIDUCIAL N/A 01/31/2017   Procedure: MINOR PLACEMENT OF FIDUCIAL;  Surgeon: [name], MD;  [facility];  Service: Neurosurgery;  Laterality: N/A;  Fiducial placement  . none    . PULSE GENERATOR IMPLANT Right 02/17/2017   Procedure: Right implantable pulse generator placement;  Surgeon: [name], MD;  [facility];  Service: Neurosurgery;  Laterality: Right;  Bilateral implantable pulse generator placement  . SUBTHALAMIC STIMULATOR INSERTION Bilateral 02/10/2017   Procedure: Bilateral Deep brain stimulator placement;  Surgeon: [name], MD;  [facility];  Service: Neurosurgery;  Laterality: Bilateral;  Bilateral Deep brain stimulator placement  . UPPER GASTROINTESTINAL ENDOSCOPY      SOCIAL HISTORY:   Social History   Socioeconomic History  . Marital status: Married    Spouse name: Not on file  . Number of children: Not on file  . Years of education: Not on file  . Highest education level: Not on file  Occupational History  . Not on file  Social Needs  . Financial resource strain: Not on file  . Food insecurity:    Worry: Not on file    Inability: Not on file  . Transportation needs:    Medical: Not on file    Non-medical: Not on file  Tobacco Use  . Smoking status: Never Smoker  . Smokeless tobacco: Never Used  Substance and Sexual Activity  . Alcohol use: Yes    Comment: Occ  . Drug use: Ian  . Sexual activity: Not on file  Lifestyle  . Physical activity:    Days [name] week: Not on  file    Minutes [name] session: Not on file  . Stress: Not on file  Relationships  . Social connections:    Talks on phone: Not on file    Gets together: Not on file    Attends religious service: Not on file    Active member of club or organization: Not on file    Attends meetings of clubs or organizations: Not on file    Relationship status: Not on file  . Intimate partner violence:    Fear of current or ex partner: Not on file    Emotionally abused: Not on file    Physically abused: Not on file    Forced sexual activity: Not on file  Other Topics Concern  . Not on file  Social History Narrative  . Not on file    FAMILY HISTORY:   Family Status  Relation Name Status  . Mother  Deceased at age 56       Breast Cancer  . Father  Alive       Diabetes  . Sister  Alive       healthy  . Daughter  Alive       2, twins  .  Son  Alive  . Other  (Not Specified)    ROS: Review of Systems  Constitutional: Positive for malaise/fatigue.  HENT: Negative.   Eyes: Negative.   Respiratory: Negative.   Cardiovascular: Negative.   Gastrointestinal: Negative.   Genitourinary: Negative.   Skin: Negative.   Neurological: Positive for tremors.  Psychiatric/Behavioral:       Increased home stress - daughter called off wedding and has been at home lately.  Will be moving her to [facility] next week      PHYSICAL EXAMINATION:    VITALS:   Vitals:   06/04/18 1433  BP: 138/84  Pulse: 92  SpO2: 96%  Weight: 166 lb (75.3 kg)  Height: 6\' 2"  (1.88 m)   Wt Readings from Last 3 Encounters:  06/04/18 166 lb (75.3 kg)  05/25/18 163 lb (73.9 kg)  04/11/18 163 lb (73.9 kg)    GEN:  The patient appears stated age and is in NAD. HEENT:  Normocephalic, AT. MMM  Neurological examination:  Orientation: The patient is alert and oriented x3.  Cranial nerves: There is good facial symmetry. The speech is fluent and clear. Soft palate rises symmetrically and there is Ian tongue deviation.  Hearing is intact to conversational tone. Sensation: Sensation is intact to light touch throughout.  Movement Examination: Tone: normal in the UE/LE  Abnormal movements: Resting tremor is noted in the bilateral lower extremities prior to programming today. Coordination:  There is normal rapid alternating movements. Gait and Station: The patient has Ian difficulty arising out of a deep-seated chair without the use of the hands.  He walks well down the hall with only slightly decreased arm swing on the right.  DBS programming was performed and is described in more detail on a separate programming procedure on note.  In brief, there was reduced tremor after programming.  ASSESSMENT/PLAN:  1. idiopathic Parkinson's disease.  The patient has tremor, bradykinesia, rigidity and mild postural instability.  -The patient underwent bilateral STN DBS with [employer] device on 02/10/2017 and had IPG placement on 02/17/2017.  -He will continue 3 [name], 145 mg, at 8:30 AM, 2 [name] at 1:30 PM.  He takes another 2 [name] at bedtime.  -Gave him samples of Inbrija and shown how to use this today.  He will use this as needed, up to 5 times [name] day.  He will let me know how he does with this. 2.  Fatigue and daytime hypersomnolence  -Restart Provigil, 200 mg and take 1 tablet at 8 AM and 1 tablet at noon.  Risks, benefits, side effects and alternative therapies were discussed.  The opportunity to ask questions was given and they were answered to the best of my ability.  The patient expressed understanding and willingness to follow the outlined treatment protocols. 3.  REM behavior disorder.  -He has clonazepam but Ian longer using 4.  F/u in 3-4 months.  Much greater than 50% of this visit was spent in counseling and coordinating care.  Total face to face time:  25 min not including DBS time

## 2018-06-04 ENCOUNTER — Encounter: Payer: Self-pay | Admitting: Neurology

## 2018-06-04 ENCOUNTER — Ambulatory Visit (INDEPENDENT_AMBULATORY_CARE_PROVIDER_SITE_OTHER): Payer: Medicare Other | Admitting: Neurology

## 2018-06-04 VITALS — BP 138/84 | HR 92 | Ht 74.0 in | Wt 166.0 lb

## 2018-06-04 DIAGNOSIS — R5383 Other fatigue: Secondary | ICD-10-CM

## 2018-06-04 DIAGNOSIS — G4710 Hypersomnia, unspecified: Secondary | ICD-10-CM | POA: Diagnosis not present

## 2018-06-04 DIAGNOSIS — Z9689 Presence of other specified functional implants: Secondary | ICD-10-CM

## 2018-06-04 DIAGNOSIS — G20 Parkinson's disease: Secondary | ICD-10-CM

## 2018-06-04 MED ORDER — MODAFINIL 200 MG PO TABS: 200.0000 mg | ORAL_TABLET | Freq: Two times a day (BID) | ORAL | 1 refills | Status: DC

## 2018-06-04 MED ORDER — LEVODOPA 42 MG IN CAPS: 2.0000 | ORAL_CAPSULE | Freq: Every day | RESPIRATORY_TRACT | 0 refills | Status: DC | PRN

## 2018-06-04 NOTE — Procedures (Signed)
DBS Programming was performed.    Total time spent programming was 30 minutes.  Device was confirmed to be on.    Soft start was on.  Impedences were checked and were within normal limits.  Battery was checked and was fully recharged.  Final settings were as follows:  Program 1: Left brain electrode:     3-C+            ; Amplitude  3.4   mamps (can adjust 2.0-3.7)   ; Pulse width 60 microseconds;   Frequency   130   Hz.  Right brain electrode:     10-30-09+ (with 20% at 12- and 80% at 11-)   ; Amplitude   3.3  mamps (can adjust 1.0-3.7);  Pulse width 60  microseconds;  Frequency   130    Hz.  Program 2 (Active): Left brain electrode:     3-C+            ; Amplitude  3.6   mamps (can adjust 2.0-3.7)   ; Pulse width 60 microseconds;   Frequency   130   Hz.  Right brain electrode:     10- (70%)11-(30%) 12+   ; Amplitude   2.4  mamps ;  Pulse width 60  microseconds;  Frequency   130    Hz. (improved tremor but had face pull above 2.4.  Tried this previously in office and had speech change but not noted today.  Wanted patient to sit in office for 15 min following programming but he had to leave so gave him ability to go back to prior setting).    Prior trials: (10-C+ with L shoulder dyskinesia at 0.267mAmp) (11-C+ with same at 0.5 mAmp) (11-10+ with same at 0.448mAmp)   Today trials: (11- (25%)12- (75%) 3.9 60/130 and still had significant leg tremor) (10-(10%) 11-(90%): face pull at 2.7)

## 2018-06-11 ENCOUNTER — Ambulatory Visit: Payer: Medicare Other | Admitting: Neurology

## 2018-06-18 ENCOUNTER — Ambulatory Visit: Payer: Medicare Other | Admitting: Family Medicine

## 2018-07-20 ENCOUNTER — Encounter: Payer: Self-pay | Admitting: Neurology

## 2018-07-20 ENCOUNTER — Encounter: Payer: Self-pay | Admitting: Family Medicine

## 2018-07-20 MED ORDER — CARBIDOPA-LEVODOPA ER 36.25-145 MG PO CPCR: 1.0000 | ORAL_CAPSULE | ORAL | 1 refills | Status: DC

## 2018-07-23 NOTE — Progress Notes (Signed)
Ian ScaleZach Esperanza [name] D.O. [facility] [address] [place], [place] [address] Phone: [phone] Subjective:     CC: Back pain  [id number]:Subjective  Ian PurserKenneth [name] is a 57 y.o. male coming in with complaint of back pain. No change in pain since last visit. Has had improvements in pain with OMT.  Patient does have the history of Parkinson's but doing well since stimulator has been placed.     Past Medical History:  Diagnosis Date  . History of kidney stones    passed  . PD (Parkinson's disease) (HCC) 09/10/2013  . PONV (postoperative nausea and vomiting)    nausea    Past Surgical History:  Procedure Laterality Date  . COLONOSCOPY    . MINOR PLACEMENT OF FIDUCIAL N/A 01/31/2017   Procedure: MINOR PLACEMENT OF FIDUCIAL;  Surgeon: [name], MD;  [facility];  Service: Neurosurgery;  Laterality: N/A;  Fiducial placement  . none    . PULSE GENERATOR IMPLANT Right 02/17/2017   Procedure: Right implantable pulse generator placement;  Surgeon: [name], MD;  [facility];  Service: Neurosurgery;  Laterality: Right;  Bilateral implantable pulse generator placement  . SUBTHALAMIC STIMULATOR INSERTION Bilateral 02/10/2017   Procedure: Bilateral Deep brain stimulator placement;  Surgeon: [name], MD;  [facility];  Service: Neurosurgery;  Laterality: Bilateral;  Bilateral Deep brain stimulator placement  . UPPER GASTROINTESTINAL ENDOSCOPY     Social History   Socioeconomic History  . Marital status: Married    Spouse name: Not on file  . Number of children: Not on file  . Years of education: Not on file  . Highest education level: Not on file  Occupational History  . Not on file  Social Needs  . Financial resource strain: Not on file  . Food insecurity:    Worry: Not on file    Inability: Not on file  . Transportation needs:    Medical: Not on file    Non-medical: Not on file  Tobacco Use  . Smoking status: Never Smoker  . Smokeless tobacco: Never Used    Substance and Sexual Activity  . Alcohol use: Yes    Comment: Occ  . Drug use: No  . Sexual activity: Not on file  Lifestyle  . Physical activity:    Days per week: Not on file    Minutes per session: Not on file  . Stress: Not on file  Relationships  . Social connections:    Talks on phone: Not on file    Gets together: Not on file    Attends religious service: Not on file    Active member of club or organization: Not on file    Attends meetings of clubs or organizations: Not on file    Relationship status: Not on file  Other Topics Concern  . Not on file  Social History Narrative  . Not on file   Allergies  Allergen Reactions  . No Known Allergies    Family History  Problem Relation Age of Onset  . Diabetes Other   . Cancer Other      Past medical history, social, surgical and family history all reviewed in electronic medical record.  No pertanent information unless stated regarding to the chief complaint.   Review of Systems:Review of systems updated and as accurate as of 07/25/18  No headache, visual changes, nausea, vomiting, diarrhea, constipation, dizziness, abdominal pain, skin rash, fevers, chills, night sweats, weight loss, swollen lymph nodes, body aches, joint swelling, muscle  aches, chest pain, shortness of breath, mood changes.   Objective  Blood pressure 110/72, pulse 75, height 6\' 2"  (1.88 m), weight 166 lb (75.3 kg), SpO2 98 %. Systems examined below as of 07/25/18   General: No apparent distress alert and oriented x3 mood and affect normal, dressed appropriately.  Mild masked feces HEENT: Pupils equal, extraocular movements intact  Respiratory: Patient's speak in full sentences and does not appear short of breath  Cardiovascular: No lower extremity edema, non tender, no erythema  Skin: Warm dry intact with no signs of infection or rash on extremities or on axial skeleton.  Abdomen: Soft nontender  Neuro: Cranial nerves II through XII are intact,  neurovascularly intact in all extremities with 2+ DTRs and 2+ pulses.  Lymph: No lymphadenopathy of posterior or anterior cervical chain or axillae bilaterally.  Gait normal with good balance and coordination.  MSK:  Non tender with full range of motion and good stability and symmetric strength and tone of shoulders, elbows, wrist, hip, knee and ankles bilaterally.  Mild hypertonicity still noted no tremor noted  Back exam shows some mild loss of lordosis.  No significant cogwheeling noted.  Does have some limited range of motion of 5 degrees in all planes.  Mild positive Pearlean Brownie on the right side.  Negative straight leg test.  Neurovascular intact distally.  Osteopathic findings C3 flexed rotated and side bent right T3 extended rotated and side bent right inhaled third rib T7 extended rotated and side bent left L4 flexed rotated and side bent left Sacrum right on right    Impression and Recommendations:     This case required medical decision making of moderate complexity.      Note: This dictation was prepared with Dragon dictation along with smaller phrase technology. Any transcriptional errors that result from this process are unintentional.

## 2018-07-25 ENCOUNTER — Encounter: Payer: Self-pay | Admitting: Family Medicine

## 2018-07-25 ENCOUNTER — Ambulatory Visit (INDEPENDENT_AMBULATORY_CARE_PROVIDER_SITE_OTHER): Payer: Medicare Other | Admitting: Family Medicine

## 2018-07-25 VITALS — BP 110/72 | HR 75 | Ht 74.0 in | Wt 166.0 lb

## 2018-07-25 DIAGNOSIS — M940 Chondrocostal junction syndrome [Tietze]: Secondary | ICD-10-CM

## 2018-07-25 DIAGNOSIS — M999 Biomechanical lesion, unspecified: Secondary | ICD-10-CM

## 2018-07-25 NOTE — Assessment & Plan Note (Signed)
Decision today to treat with OMT was based on Physical Exam  After verbal consent patient was treated with HVLA, ME, FPR techniques in cervical, thoracic, rib,  lumbar and sacral areas  Patient tolerated the procedure well with improvement in symptoms  Patient given exercises, stretches and lifestyle modifications  See medications in patient instructions if given  Patient will follow up in 4-8 weeks 

## 2018-07-25 NOTE — Patient Instructions (Signed)
Good to see you  Enjoy the beach  Stay active as always See me again in 6-8 weeks

## 2018-07-25 NOTE — Assessment & Plan Note (Signed)
Still secondary to tightness.  Has had some low back pain.  Has responded well known to my osteopathic manipulation overall.  Patient is not having as much trouble with his muscle tightness and soreness as well.  Patient will follow-up with me again 4 to 8 weeks

## 2018-08-23 NOTE — Progress Notes (Signed)
Ian [name] was seen today in the movement disorders clinic for neurologic consultation at the request of his PCP, Dr. Drue [name].  He has previously seen  Patient, Ian [name] and saw Dr. Edwin [name] in [place].   This patient is accompanied in the office by his spouse who supplements the history.The consultation is for the evaluation of PD.  I did review Dr. Carloyn [name] notes.  Dr. Hurley [name] notes are not available to me.    Hist first sx was tremor was R arm tremor.  It was in 2011 according to the patient today, but Dr. Faye [name] notes indicate that perhaps this was in 2009.  He was dx with enhanced physiologic tremor.  He was started on propranolol with some help, but made him sluggish.  He went to see Dr. Edwin [name] in 2013 for an opinion in [place].  Dr. 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  He was placed on baclofen and requip.  He thinks that the requip has helped; he is able to turn over in the bed better and move better.  The pt first saw Dr. Rubin [name] on 09/10/13.  Dr. Rubin [name] agreed with the Requip and start the patient on [name], but the patient is currently only on its once [name] day.  12/04/13 update:  The pt presents to the clinic today for his PD.  He is exercising faithfully.  He attends the PD exercise class through the rehab center. The pt was changed to the IR formulation of carbidopa/levodopa 25/100 last visit and is on it tid.  He is not sure it helps.  Afternoons are better than mornings.  carbidopa/levodopa 25/100 is taken at 8am/3:30 pm and bedtime. He is also on requip xl 6 mg daily.  Continues to have significant tremor.  01/27/14 update:  The patient presents today with his wife, who supplements the history.  The pt has a hx of PD and last visit, we decided to increase his carbidopa/levodopa to 2 po tid.  the patient states that he just felt lightheaded on this medication  dosage and up going back to [name] 3 times [name] day and then takes [name], half tablet twice [name] day. He remains on requip xl 6 mg daily.  Overall, he does feel fairly good.  He still has tremor.  He went to see Dr. Rubin [name] since our last visit and again talked about DBS therapy.  Dr. Rubin [name] mentioned to him that he looks much better and he had previously and had [name] score had declined.  Some yelling out in the sleep but this has not been a huge issue.  Ian sleepwalking/talking.  Ian falls.  He is exercising faithfully.  05/28/14 update:  The patient is presenting today for followup.  Last visit, I increased his [name] from 6 mg daily to 4 mg twice a day.  He is doing well with this.  Tremor is about the same.  He is on a strange combination of carbidopa levodopa, but when I tried to switch him off the IR version, the patient just felt lightheaded and ended up going back to a combination of IR and CR.  He is currently on 25/100 CR 3 times [name] day and then takes [name], half tablet twice [name] day and sometimes three times [name] day.  He does best in the evening and worst in the AM.   Ian swallowing troubles.  Has dropped weight but thinks that it is exercise and was trying to follow gluten free diet. He is walking, doing a spinning class for PD, and has started golfing again, which he hasn't done in years.   Still considering DBS therapy.    08/27/14 update:  Patient presents today for followup.  He is currently on [name], 4mg  twice a day.  He is on a strange combination of carbidopa levodopa, but hasn't been able to tolerate the IR version well.[name]  He is currently on 25/100 CR 2-3 times [name] day and then 50/200 at night.  He rarely takes the IR now; only if playing golf or if he needs a boost during the day.  He is finding that he is having more tremor.  He is also having dreams at night that are making his sleep feel unrestful.  He is strongly  considering deep brain stimulation.  He asks me about details regarding this today.  He has asks me about potentially getting a refill on his Ativan.  He apparently got this filled from an outside physician about a year and a half ago and only had 10 pills in total and is just now running out.  He takes one pill every 3 or 4 months if he has to go to a function with his wife.  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  He asks me if he can have a refill today.  09/10/14 update:  The patient is seen today unexpectedly.  Last visit, I started [name] 145 mg, 2 tablets 3 times [name] day.  He is also on [name] 4 mg twice a day.  He called me initially to tell me that the [name] was working great and he felt much better on the medication.  In fact, he states that his PT and OT reported that he looked much better and his eval scores were markedly improved and they asked him what he had done differently.  He states last week was just a great week.  On Saturday, he was just a little lightheaded and his wife checked his orthostatics and he states that they were negative.  This week, however, he began to report some abnormal movements.  The movements were primarily of the left shoulder and in the abdomen. I wanted to see him on this medication before added or changed anything, as I have never seen him with dyskinesia.  Unfortunately, this morning he only took one of his tablets, but he did take 2 tablets at lunch time about 1 hour before coming.  He has not noticed the movements today.  He has noticed much less tremor overall since starting [name].  11/25/14 update:  The patient returns today for follow-up.  He is on [name] 4 mg twice a day and [name] 145 mg, 2 tablets 3 times [name] day.  His biggest issue is tremor, but states that its under good control today (but he just exercised).  He has developed tendinitis in the elbow.  He has clonazepam for REM behavior  disorder but admits he doesn't use it.  The patient remains very active in our Parkinson's exercise classes.  Ian falls.  Some days he does have to "fight" the posture and the "shuffling."  He didn't ever get to see Dr. Venetia Maxon as he states that his life got hectic moving his mother in law.    02/20/15 update:  The patient returns today, accompanied by his  wife who supplements the history.  Last visit, I increased his [name] to 195 mg, and he is taking 2 tablets 3 times a day.  He is also on [name] 4 mg twice a day.  He reports that he has not been doing as well because stress levels have been increased.  His mother-in-law, who is living with them, died unexpectedly.  Because of that, he has had more trouble sleeping as well.  He is still faithfully attending exercise classes and is involved in the Parkinson's community.  He has noticed more rigidity and tremor.  He is taking melatonin, 5 Mill grams at night to help him sleep but does not want to use clonazepam, even though he has at home.  05/24/25 update:  The patient returns today for follow-up.  His wife accompanies him and supplements the history.  He is on [name] 4 mg twice a day and [name] 195 mg, 2 tablets 3 times [name] day.  He has not wanted to increase the dose, although he likely could use more levodopa. States that he is more tremulous right now and attributes that to the fact that he has had a GI illness recently, which made sx's worse.  He has had emesis (one episode).  Ian diarrhea.  Ian fevers.    He has not had any falls.  He denies any hallucinations.  Ian confusion.  Ian lightheadedness or near syncope.  He continues to exercise faithfully.  06/12/15 update:  The patient is following up today, accompanied by his wife who supplements the history.  When I saw him last on 05/22/15, he was obviously underdosed and I expressed the need for more levodopa but the pt didn't want to increase it as he felt that his viral gastroenteritis was just causing his  worsening sx's.  He didn't tell me then that he had been off of his meds for 3 days and that is the reason he didn't look well.  They called me on 06/10/2015 however to state that he was doing much worse.  They wanted me to work him in that day, but I was not able to.  I didn't realize that he had been off all his PD meds since June 1 and had started receiving care from [place] natureopath in [place], Dr. Beckie BusingUrban.  He was given information on Dr. Beckie BusingUrban by his dentist Dr. Mellody DanceKeith, who told him he should try amino acid therapy.  He researched and started seeing Dr. Beckie BusingUrban and was told he needed to be off all his PD meds.  He started on D5-mucuna which markets itself as a "safe, natural" levodopa therapy.  He was told that he needed 4-6 months to see a response, but admits that he was told to tell his doctor what he was doing.  He was increasing the dose of this supplement based on urine dopamine levels which they were told were low.  They apparently were also measuring urine serotonin levels.  Not surprisingly, the patient decompensated.  He states that initially he didn't "feel that bad" but the "tremor was awful."  He also states that he had terrible vivid dreams and ultimately became exhausted from not sleeping, shaking and increased rigidity. It got so bad that he ended up in the ER 2 days ago.    He is back on rytary 195, 2 po tid.  He is a little quesy since being back on it.  He has only been on it for 2 days.  He states that  he talked with the natureopath and she told him to just "start back on it."  He is off of the D5-mucuna.  He and his wife bring in several articles for me to read today.    09/29/15 update:  The patient is following up today, accompanied by his wife who supplements the history.  He is on [name] 4 mg twice a day.  He reports that he is now taking  [name] 195 mg, 2 tablets 3 times a day along with [name] 145 mg, one tablet 3 times a day.  He spreads it out so that he takes it at 8 AM/3 PM/11 PM.   He does not necessarily halfway he describes as "traditional wearing off" but states that at the end of the dose he just does not feel good.  Overall, the patient states that he has been doing okay but he also feels that things are more unpredictable.  He feels more tired and thinks that he has mild dyskinesia.  When he eats, he is biting his lip in the same place.   He denies any lightheadedness or near syncope.  He denies falls.  When asked about depression, he states that he thinks "depression is a strong word" but admits that he gets frustrated and overwhelmed about the disease but uses exercise as a release.  He has sent me paperwork since last visit for his disability which has been completed.  He asks me about writing another letter regarding his VA disability as well.  12/30/15 update:  The patient is following up today.  Last visit, I increased his [name] to 6 mg twice a day.  His [name] has been slightly increased, so that he is on 195 mg, 3 tablets 3 times [name] day and he added an additional 1 tablet at night because he was awakening at 4 AM with tremor. He has changed that again, but taking the same overall dose of rytary, but is now taking 195, 3 in the AM, 2 in the afternoon, 3 in the evening and then takes 2 at bedtime.   Overall, he thinks he feels that it is a little inconsistent; sometimes he does well and sometimes he doesn't.  He continues to faithfully exercise.  Ian falls.  Ian hallucinations.  Ian syncope but has had some lightheadedness; he has had a uri and isn't sure if it is related to that/meds for that.  Mood has been good.  Sleep has been better since he has been back on his medications.  Appetite has been good.  Ian new medical issues.  He is still going to integrative therapies and thinks that it is helpful.  However, he did some therapy called alpha stim and was told that it changed the alpha waves in the brain and he didn't think that it was helpful and thinks that it made him feel  strange.  Feels that he has minor dyskinesia, especially of the arm when he walks or bends over.  Asks about DBS therapy and thinks he is going to consider that more this year.    03/03/16 update:  The patient is following up today.  He is on [name], 6 mg twice a day and [name] 195 mg on the following schedule: 3/3/3/2.  This is a little higher than last visit (prior 3/2/3/2).  He denies any falls since last visit.  He denies lightheadedness or near syncope.  Ian hallucinations.  Tremor has been pretty good, but it will fluctuate throughout the  day (states that he did 2 workouts today).  He does occasionally feel like he has some dyskinesia.  He saw Dr. [name] on February 6 and March 13 and I reviewed those records.  OMT was performed.  The patient feels that that was helpful for his musculoskeletal complaints.  He is doing lots of exercises.  Mood is stable.  Asks about meeting with Dr. Venetia Maxon for DBS but wife not ready to pursue.    06/24/16 update:  The patient is following up today, accompanied by his wife who supplements the history.    He is here for her levodopa challenge test.  He saw Dr. Venetia Maxon in May and is interested in DBS therapy.  Last visit, I increased his [name] so that he is taking 4 mg, 2 tablets twice a day.  He remains on [name] 195 mg on the following schedule: 3/3/3/2.  He takes clonazepam as needed for REM behavior disorder.  He has not had falls since last visit.  He has seen Dr. [name] several times and I reviewed those records.  He has not had any lightheadedness or near syncope.  He continues to exercise faithfully.  10/18/16 update:  Pt f/u today.  Wife doesn't accompany today.  He saw Dr. Alinda Dooms since our last visit and Ian evidence of cognitive deficits.  Pt on [name] 4 mg - 2 po bid and rytary 195 mg: and he has changed that from 3/3/3/2 to 4/3/3/3.  He has a little more off time with more tremor and slowness.  In late summer, he was putting a putting green into his  back yard and states that he tore an intercostal muscle so he couldn't exercise for 8-9 weeks because of pain with breathing.  Started back to working out 3-4 weeks ago.   01/14/16 update:  Patient follows up today, as he has some questions regarding DBS therapy.  Since our last visit, the [facility] device for deep brain stimulation has been FDA approved.  The patient and I discussed the device over the telephone, and he subsequently read about the device and thought that he would prefer to have this particular device and presents today to talk about it in more detail.  He remains on [name], 4 mg, and takes 2 tablets twice [name] day.  He also takes [name] 195 mg: 4/3/3/3.  Last visit, I gave him some carbidopa/levodopa 25/100 to use as needed and he states that he tried that but it didn't work well for him and so he isn't using that much.  01/31/17 update:  Patient follows up today for preop DBS recording.  Wife present and supplements history.  He had fiducials placed today.  He was to hold his meds this AM but didn't and feels like he is "on."    03/13/17 update:  Patient follows up today.  Patient is accompanied by his twin sister and wife who supplements the history.  The patient underwent bilateral STN DBS with [facility] [employer] device on 02/10/2017 and had IPG placement on 02/17/2017.  The patient has recovered nicely postoperatively.  He had some increase dyskinesia and emailed me about that.  I did tell him he could slightly decrease his [name] if he would like.  He states that he has been taking [name] 195, 3 po tid and [name], 4 mg, 2 tablets twice [name] day.  He held his Requip today and last took [name], 3 of them at 8 am.  He has had  Ian falls.  Ian lightheadedness or near syncope.  03/20/17 update:  Patient is seen today back in follow-up.  He is accompanied by his wife who supplements the history.  His device was activated one week ago, but we did see him today after activation as he was  having dyskinesia on the right side of the body.  The patient relates that the dyskinesia seem to be related to taking [name] 195 mg, 2 tablets and 1 [name] 4 mg.  He ended up not taking the medication and the dyskinesia when away.  However, when he was seen back to adjust the programming, he was more rigid, but was reluctant for me to change anything because he Ian longer had the dyskinesia.  We went up slightly on the left hemibody (right brain) and gave him some control over the left brain to go up and down.  He is still having some tremor in his feet.  He states that he is only taking med prn.  He took 1 Requip on Friday, none on Saturday, 1 requip and 1 rytary yesterday.  He feels that he did well yesterday with tremor and slight dyskinesia.  Wife states that she notices that patient has been "melancholy."  Part of this is because of the frustration with balance of tremor and dyskinesia and part of it is because he has not been back to exercising lately.  04/14/17 update:  Patient is seen back today in follow-up.  He has adjusted his DBS some on his own.  He states that he has had some difficulty in balancing symptoms between the left and right side of the body.  He has also been having some challenges with mood.  He was taking carbidopa/levodopa 25/100, 2-3 times [name] day, but this past week he ended up switching to [name] 195 mg and has been taking that 3 times [name] day.  He does think that that has helped mood somewhat.  He is back to exercising.  He has not fallen.  06/23/17 update:  Patient is seen today in follow-up.  He is currently on [name], 145 mg and takes 2 to 3 [name] at 8:00am, 2 [name] at 1:00pm, 2 [name] At 6:00pm and 1 [name] at bedtime.  He has been off and on the Requip.  He initially thought it made him fatigued, but went back to taking the 2 mg tablets and is on the XL.  Thinks that the XL gives him some dyskinesia.   Nonmotor symptoms such as fatigue and apathy have been the biggest  issues.  He really has not wanted to take any antidepressants.  States that he is not depressed and taking supplement of amino acid triptophan and he feels apathy is better.  He has struggled with voice strength.  He has not had falls.  He is back to exercise.  For the first time in a long time, he was able to walk the entire golf course and play a round of golf without a feeling of wearing off of meds.  10/17/17 update: Patient seen today in follow-up for Parkinson's disease, status post DBS surgery.  He is on rytary, 145 mg and takes 2-3 [name] at 8 AM, 2 [name] at 1 PM, 2 [name] at 6 PM and 1 [name] at bedtime.  He is on ropinirole XL, 2 mg daily.  Ian falls.  Ian lightheadedness or near syncope.  Ian hallucinations.  Ian compulsive behaviors.  Ian sleep attacks.  He is doing spin class 2 days  [name] week, lifting weights 4 days [name] week, [employer] classs.  He is busy with church.  Not much dyskinesia.  Not much tremor.  He turned L side down a little.  He is going to [place] with [employer] to tell his story.  He is fatigued but doesn't want medication for that  02/05/18 update: Patient is seen today in follow-up for Parkinson's disease.  Patient emailed me this past Friday to let me know that he has been "experimenting" with how he takes his medication, mixing both [name] with immediate release levodopa.  Today, he tells me that he is currently taking rytary, 145 mg, 2-3 tablets 3 times [name] day and [name] prn (generally one time [name] day).  He reports that the tremor in the left foot is fairly unpredictable and when bad, he uses it as a sign that it is time for medication.  In his email, he also felt that the Requip was not helping, so I told him to go ahead and discontinue that over the weekend and we would address it today.   He ended up stopping it last night as he had stress over the week. He has attended speech therapy.  He also attended the new thrive and connect group for active patients with  Parkinson's disease.  In fact, he will be leading this group.  06/04/18 update: Patient is seen today in follow-up for Parkinson's disease.  He emailed me Friday to give me an update.  Last visit, I told him to go ahead and discontinue the Requip.  He ended up doing that about a week ago.  He has been taking 3 [name], 145 mg, at 8:30 AM, 2 [name] at 1:30 PM.  He takes another 2 [name] at bedtime.  He gets quite fatigued in the mid afternoon, despite starting Provigil in the morning. He states that he actually stopped it because he wasn't sure it was that helpful.     He is noting tremor of the left greater than right foot.  He feels that he is always "behind" with medication but if he takes more than what he is currently, he will have some dyskinesia.  08/27/18 update: Patient seen today in follow-up for Parkinson's disease.  Patient is on [name] 145 mg, and is now usually taking 2 po qid.  Does give him dyskinesia. Samples given last visit of Inbrija and the patient states that he hasn't tried it yet. Provigil was restarted last visit because of daytime hypersomnolence.  He was taking twice a day and thought that it made him feel weird, as if it wears off and he drops off at end of day.  Wife thinks that bigger issue is sleep issues at night and being tired because of that.  Wife notes nightmares.  She notes that 3-4 nights [name] week and has to awaken him b/c of nightmare.   Records have been reviewed since our last visit.  He saw Dr. Katrinka Blazing on August 7 for back pain and he did osteopathic manipulative therapy.  PREVIOUS MEDICATIONS: Requip and neupro (tried few days only); propranolol helped tremor some; inbrija - samples given but not taken  ALLERGIES:   Allergies  Allergen Reactions  . Ian Known Allergies     CURRENT MEDICATIONS:   Allergies as of 08/27/2018      Reactions   Ian Known Allergies       Medication List        Accurate as of 08/27/18  3:40 PM.  Always use your most recent med list.           carbidopa-levodopa 25-100 MG tablet Commonly known as:  SINEMET IR Take 1 tablet by mouth as needed.   Carbidopa-Levodopa ER 36.25-145 MG Cpcr Take 1 tablet by mouth See admin instructions. 3 in the morning, 2 in the afternoon, 2 in the evening   clonazePAM 0.5 MG tablet Commonly known as:  KLONOPIN Take 0.5 tablets (0.25 mg total) by mouth at bedtime.   LECITHIN PO Take 1 capsule by mouth daily.   Levodopa 42 MG Caps Place 2 capsules into inhaler and inhale 5 (five) times daily as needed.   modafinil 200 MG tablet Commonly known as:  PROVIGIL Take 1 tablet (200 mg total) by mouth 2 (two) times daily. 1 in AM and 1 at noon   MULTI-VITAMINS Tabs Take 1 tablet by mouth 2 (two) times daily.   niacin 500 MG tablet Take 500 mg by mouth 3 (three) times daily with meals.   vitamin C 1000 MG tablet Take 5,000 mg by mouth daily.   Vitamin D-3 5000 units Tabs Take 5,000 Units by mouth daily.   vitamin E 400 UNIT capsule Take 400 Units by mouth daily.        PAST MEDICAL HISTORY:   Past Medical History:  Diagnosis Date  . History of kidney stones    passed  . PD (Parkinson's disease) (HCC) 09/10/2013  . PONV (postoperative nausea and vomiting)    nausea     PAST SURGICAL HISTORY:   Past Surgical History:  Procedure Laterality Date  . COLONOSCOPY    . MINOR PLACEMENT OF FIDUCIAL N/A 01/31/2017   Procedure: MINOR PLACEMENT OF FIDUCIAL;  Surgeon: Maeola Harman, MD;  Location[facility] OR;  Service: Neurosurgery;  Laterality: N/A;  Fiducial placement  . none    . PULSE GENERATOR IMPLANT Right 02/17/2017   Procedure: Right implantable pulse generator placement;  Surgeon: Maeola Harman, MD;  Location: [facility] OR;  Service: Neurosurgery;  Laterality: Right;  Bilateral implantable pulse generator placement  . SUBTHALAMIC STIMULATOR INSERTION Bilateral 02/10/2017   Procedure: Bilateral Deep brain stimulator placement;  Surgeon: Maeola Harman, MD;  Location: [facility] OR;  Service:  Neurosurgery;  Laterality: Bilateral;  Bilateral Deep brain stimulator placement  . UPPER GASTROINTESTINAL ENDOSCOPY      SOCIAL HISTORY:   Social History   Socioeconomic History  . Marital status: Married    Spouse name: Not on file  . Number of children: Not on file  . Years of education: Not on file  . Highest education level: Not on file  Occupational History  . Not on file  Social Needs  . Financial resource strain: Not on file  . Food insecurity:    Worry: Not on file    Inability: Not on file  . Transportation needs:    Medical: Not on file    Non-medical: Not on file  Tobacco Use  . Smoking status: Never Smoker  . Smokeless tobacco: Never Used  Substance and Sexual Activity  . Alcohol use: Yes    Comment: Occ  . Drug use: Ian  . Sexual activity: Not on file  Lifestyle  . Physical activity:    Days [name] week: Not on file    Minutes [name] session: Not on file  . Stress: Not on file  Relationships  . Social connections:    Talks on phone: Not on file    Gets together: Not on file    Attends religious  service: Not on file    Active member of club or organization: Not on file    Attends meetings of clubs or organizations: Not on file    Relationship status: Not on file  . Intimate partner violence:    Fear of current or ex partner: Not on file    Emotionally abused: Not on file    Physically abused: Not on file    Forced sexual activity: Not on file  Other Topics Concern  . Not on file  Social History Narrative  . Not on file    FAMILY HISTORY:   Family Status  Relation Name Status  . Mother  Deceased at age 3       Breast Cancer  . Father  Alive       Diabetes  . Sister  Alive       healthy  . Daughter  Alive       2, twins  . Son  Alive  . Other  (Not Specified)    ROS: Review of Systems  Constitutional: Positive for malaise/fatigue.  Eyes: Negative.   Respiratory: Negative.   Cardiovascular: Negative.   Gastrointestinal: Negative.     Musculoskeletal: Negative.   Skin: Negative.   Neurological: Positive for tremors.  Endo/Heme/Allergies: Negative.      PHYSICAL EXAMINATION:    VITALS:   Vitals:   08/27/18 1428  BP: 128/72  Pulse: 78  SpO2: 96%  Weight: 172 lb (78 kg)  Height: 6\' 2"  (1.88 m)   Wt Readings from Last 3 Encounters:  08/27/18 172 lb (78 kg)  07/25/18 166 lb (75.3 kg)  06/04/18 166 lb (75.3 kg)    GEN:  The patient appears stated age and is in NAD. HEENT:  Normocephalic, atraumatic.  The mucous membranes are moist. The superficial temporal arteries are without ropiness or tenderness. CV:  RRR Lungs:  CTAB Neck/HEME:  There are Ian carotid bruits bilaterally.  Neurological examination:  Orientation: The patient is alert and oriented x3. Cranial nerves: There is good facial symmetry. The speech is fluent and clear. Soft palate rises symmetrically and there is Ian tongue deviation. Hearing is intact to conversational tone. Sensation: Sensation is intact to light touch throughout Motor: Strength is 5/5 in the bilateral upper and lower extremities.   Shoulder shrug is equal and symmetric.  There is Ian pronator drift.  Movement examination: Tone: There is normal tone in the upper and lower extremities. Abnormal movements: Initially none, but about halfway through the visit he began to developed dyskinesia in the left shoulder as he had just taken his [name] Coordination:  There is Ian decremation with RAM's, with any form of RAMS, including alternating supination and pronation of the forearm, hand opening and closing, finger taps, heel taps and toe taps. Gait and Station: The patient has Ian difficulty arising out of a deep-seated chair without the use of the hands. The patient's stride length is good with good armswing.     ASSESSMENT/PLAN:  1. idiopathic Parkinson's disease.  The patient has tremor, bradykinesia, rigidity and mild postural instability.  -The patient underwent bilateral STN DBS  with [facility] device on 02/10/2017 and had IPG placement on 02/17/2017.  [employer] device has gotten FDA approval for MRI compatibility but his battery would need to be changed out.  He would like to proceed with that.  Will send referral to Dr. Venetia Maxon  -He will continue rytary 145 mg, 2 po qid.  Told him that if having a lot  of dyskinesia to hold med 2.  Fatigue and daytime hypersomnolence  -didn't think that provigil help and had SE  -was going to start nuvigil but wife brought up thinks that sleep d/o is causing EDS so will address that first. 3.  REM behavior disorder.  -He had clonazepam but not using.  He doesn't think that he ever tried it.  try klonopin - 0.5 mg - 1/2 po q hs.  Risks, benefits, side effects and alternative therapies were discussed.  The opportunity to ask questions was given and they were answered to the best of my ability.  The patient expressed understanding and willingness to follow the outlined treatment protocols. 4. Follow up is anticipated in the next few months, sooner should new neurologic issues arise.  Much greater than 50% of this visit was spent in counseling and coordinating care.  Total face to face time:  30 min not including DBS time

## 2018-08-27 ENCOUNTER — Encounter: Payer: Self-pay | Admitting: Neurology

## 2018-08-27 ENCOUNTER — Ambulatory Visit (INDEPENDENT_AMBULATORY_CARE_PROVIDER_SITE_OTHER): Payer: Medicare Other | Admitting: Neurology

## 2018-08-27 VITALS — BP 128/72 | HR 78 | Ht 74.0 in | Wt 172.0 lb

## 2018-08-27 DIAGNOSIS — R5383 Other fatigue: Secondary | ICD-10-CM | POA: Diagnosis not present

## 2018-08-27 DIAGNOSIS — G4752 REM sleep behavior disorder: Secondary | ICD-10-CM | POA: Diagnosis not present

## 2018-08-27 DIAGNOSIS — G20 Parkinson's disease: Secondary | ICD-10-CM | POA: Diagnosis not present

## 2018-08-27 DIAGNOSIS — Z9689 Presence of other specified functional implants: Secondary | ICD-10-CM

## 2018-08-27 MED ORDER — CLONAZEPAM 0.5 MG PO TABS: 0.2500 mg | ORAL_TABLET | Freq: Every day | ORAL | 0 refills | Status: DC

## 2018-08-27 NOTE — Procedures (Signed)
DBS Programming was performed.    Total time spent programming was 10 minutes.  Device was confirmed to be on.    Soft start was on.  Impedences were checked and were within normal limits.  Battery was checked and was fully recharged.  Final settings were as follows:  Program 1: Left brain electrode:     3-C+            ; Amplitude  3.4   mamps (can adjust 2.0-3.7)   ; Pulse width 60 microseconds;   Frequency   130   Hz.  Right brain electrode:     10-30-09+ (with 20% at 12- and 80% at 11-)   ; Amplitude   3.3  mamps (can adjust 1.0-3.7);  Pulse width 60  microseconds;  Frequency   130    Hz.  Program 2 (Active): Left brain electrode:     3-C+            ; Amplitude  3.4   mamps (can adjust 2.0-3.7)   ; Pulse width 60 microseconds;   Frequency   130   Hz.  Right brain electrode:     10- (70%)11-(30%) 12+   ; Amplitude   2.4  mamps ;  Pulse width 60  microseconds;  Frequency   130    Hz.   Prior trials: (10-C+ with L shoulder dyskinesia at 0.46mAmp) (11-C+ with same at 0.5 mAmp) (11-10+ with same at 0.34mAmp)   Today trials: (11- (25%)12- (75%) 3.9 60/130 and still had significant leg tremor) (10-(10%) 11-(90%): face pull at 2.7)

## 2018-08-27 NOTE — Patient Instructions (Signed)
1. We are referring you to Dr Venetia Maxon at [facility] to evaluate for an MRI compatible battery replacement. They will call you directly to set up an appointment date and time. If you do not hear from them they can be contacted directly at [phone].

## 2018-08-28 ENCOUNTER — Telehealth: Payer: Self-pay | Admitting: Neurology

## 2018-08-28 NOTE — Telephone Encounter (Signed)
Referral faxed to  Neurosurgery at [phone] with confirmation received. They will contact the patient to schedule.  

## 2018-08-28 NOTE — Telephone Encounter (Signed)
Received note that patient scheduled to see Dr. Venetia Maxon on 09/07/18 at 3:45 pm.

## 2018-08-30 ENCOUNTER — Ambulatory Visit (INDEPENDENT_AMBULATORY_CARE_PROVIDER_SITE_OTHER): Payer: Medicare Other | Admitting: Family Medicine

## 2018-08-30 ENCOUNTER — Ambulatory Visit: Payer: Self-pay

## 2018-08-30 ENCOUNTER — Encounter: Payer: Self-pay | Admitting: Family Medicine

## 2018-08-30 VITALS — BP 108/68 | HR 62 | Ht 74.0 in | Wt 168.0 lb

## 2018-08-30 DIAGNOSIS — M25511 Pain in right shoulder: Secondary | ICD-10-CM

## 2018-08-30 DIAGNOSIS — M999 Biomechanical lesion, unspecified: Secondary | ICD-10-CM

## 2018-08-30 DIAGNOSIS — M7551 Bursitis of right shoulder: Secondary | ICD-10-CM | POA: Diagnosis not present

## 2018-08-30 DIAGNOSIS — M940 Chondrocostal junction syndrome [Tietze]: Secondary | ICD-10-CM

## 2018-08-30 NOTE — Assessment & Plan Note (Signed)
Injection given today.  Tolerated the procedure well.  Hopefully patient will have improvement.  We did have to doing a chromic clavicular injection as well but we decided to hold on that today.  Follow-up in 4 weeks

## 2018-08-30 NOTE — Assessment & Plan Note (Signed)
Decision today to treat with OMT was based on Physical Exam  After verbal consent patient was treated with HVLA, ME, FPR techniques in cervical, thoracic, rib,  lumbar and sacral areas  Patient tolerated the procedure well with improvement in symptoms  Patient given exercises, stretches and lifestyle modifications  See medications in patient instructions if given  Patient will follow up in 4-8 weeks 

## 2018-08-30 NOTE — Assessment & Plan Note (Signed)
Still muscle tightness.  Does respond well to manipulation.  Continue to work on posture and ergonomics and scapular exercises.  Follow-up again in 1 to 3 months

## 2018-08-30 NOTE — Progress Notes (Signed)
[name] [address] [place], [place] [address] Phone: [phone] Subjective:   [name], am serving as a scribe for Dr. Antoine Primas.   CC: Right shoulder pain, home exercises have not been helping.  BJY:[id number]  Ian [name] is a 57 y.o. male coming in with complaint of right shoulder pain. Pain for past 2 weeks. Pain over superior shoulder. Flexion, overhead and bench press bother him. Has had injection in shoulder 6 months ago he states.   His back has been doing good. Is here for OMT to help manage his pain.  Patient has been very active otherwise.  Continues to workout.  Some mild stiffness but nothing more than his usual.    Past Medical History:  Diagnosis Date  . History of kidney stones    passed  . PD (Parkinson's disease) (HCC) 09/10/2013  . PONV (postoperative nausea and vomiting)    nausea    Past Surgical History:  Procedure Laterality Date  . COLONOSCOPY    . MINOR PLACEMENT OF FIDUCIAL N/A 01/31/2017   Procedure: MINOR PLACEMENT OF FIDUCIAL;  Surgeon: Maeola Harman, MD;  Location: [facility] OR;  Service: Neurosurgery;  Laterality: N/A;  Fiducial placement  . none    . PULSE GENERATOR IMPLANT Right 02/17/2017   Procedure: Right implantable pulse generator placement;  Surgeon: Maeola Harman, MD;  Location: [facility] OR;  Service: Neurosurgery;  Laterality: Right;  Bilateral implantable pulse generator placement  . SUBTHALAMIC STIMULATOR INSERTION Bilateral 02/10/2017   Procedure: Bilateral Deep brain stimulator placement;  Surgeon: Maeola Harman, MD;  Location: [facility] OR;  Service: Neurosurgery;  Laterality: Bilateral;  Bilateral Deep brain stimulator placement  . UPPER GASTROINTESTINAL ENDOSCOPY     Social History   Socioeconomic History  . Marital status: Married    Spouse name: Not on file  . Number of children: Not on file  . Years of education: Not on file  . Highest education level: Not on file  Occupational History  . Not on  file  Social Needs  . Financial resource strain: Not on file  . Food insecurity:    Worry: Not on file    Inability: Not on file  . Transportation needs:    Medical: Not on file    Non-medical: Not on file  Tobacco Use  . Smoking status: Never Smoker  . Smokeless tobacco: Never Used  Substance and Sexual Activity  . Alcohol use: Yes    Comment: Occ  . Drug use: No  . Sexual activity: Not on file  Lifestyle  . Physical activity:    Days per week: Not on file    Minutes per session: Not on file  . Stress: Not on file  Relationships  . Social connections:    Talks on phone: Not on file    Gets together: Not on file    Attends religious service: Not on file    Active member of club or organization: Not on file    Attends meetings of clubs or organizations: Not on file    Relationship status: Not on file  Other Topics Concern  . Not on file  Social History Narrative  . Not on file   Allergies  Allergen Reactions  . No Known Allergies    Family History  Problem Relation Age of Onset  . Diabetes Other   . Cancer Other          Current Outpatient Medications (Other):  [name]  Ascorbic Acid (VITAMIN C)  1000 MG tablet, Take 5,000 mg by mouth daily. .  carbidopa-levodopa (SINEMET IR) 25-100 MG tablet, Take 1 tablet by mouth as needed. .  Carbidopa-Levodopa ER ([name]) 36.25-145 MG CPCR, Take 1 tablet by mouth See admin instructions. 3 in the morning, 2 in the afternoon, 2 in the evening .  Cholecalciferol (VITAMIN D-3) 5000 units TABS, Take 5,000 Units by mouth daily. .  clonazePAM (KLONOPIN) 0.5 MG tablet, Take 0.5 tablets (0.25 mg total) by mouth at bedtime. [name].  LECITHIN PO, Take 1 capsule by mouth daily.  .  Levodopa (INBRIJA) 42 MG CAPS, Place 2 capsules into inhaler and inhale 5 (five) times daily as needed. .  modafinil (PROVIGIL) 200 MG tablet, Take 1 tablet (200 mg total) by mouth 2 (two) times daily. 1 in AM and 1 at noon .  Multiple Vitamin (MULTI-VITAMINS) TABS,  Take 1 tablet by mouth 2 (two) times daily.  .  niacin 500 MG tablet, Take 500 mg by mouth 3 (three) times daily with meals. .  vitamin E 400 UNIT capsule, Take 400 Units by mouth daily.     Past medical history, social, surgical and family history all reviewed in electronic medical record.  No pertanent information unless stated regarding to the chief complaint.   Review of Systems:  No headache, visual changes, nausea, vomiting, diarrhea, constipation, dizziness, abdominal pain, skin rash, fevers, chills, night sweats, weight loss, swollen lymph nodes, body aches, joint swelling, chest pain, shortness of breath, mood changes.  Positive muscle aches  Objective  Blood pressure 108/68, pulse 62, height 6\' 2"  (1.88 m), weight 168 lb (76.2 kg), SpO2 95 %.    General: No apparent distress alert and oriented x3 mood and affect normal, dressed appropriately.  HEENT: Pupils equal, extraocular movements intact  Respiratory: Patient's speak in full sentences and does not appear short of breath  Cardiovascular: No lower extremity edema, non tender, no erythema  Skin: Warm dry intact with no signs of infection or rash on extremities or on axial skeleton.  Abdomen: Soft nontender  Neuro: Cranial nerves II through XII are intact, neurovascularly intact in all extremities with 2+ DTRs and 2+ pulses.  Lymph: No lymphadenopathy of posterior or anterior cervical chain or axillae bilaterally.  Gait normal with good balance and coordination.  MSK:  Non tender with full range of motion and good stability and symmetric strength and tone of  elbows, wrist, hip, knee and ankles bilaterally.  Mild hypertonicity noted Shoulder: Right Inspection reveals no abnormalities, atrophy or asymmetry. Palpation is normal with no tenderness over AC joint or bicipital groove. ROM is full in all planes passively. Rotator cuff strength normal throughout. signs of impingement with positive Neer and Hawkin's tests, but  negative empty can sign. Speeds and Yergason's tests normal. No labral pathology noted with negative Obrien's, negative clunk and good stability. Normal scapular function observed. No painful arc and no drop arm sign. No apprehension sign  MSK US performed of: Right This study was ordered, performed, and interpreted by Terrilee FilesZach Smith D.O.  Shoulder:   Supraspinatus:  Appears normal on long and transverse views, Bursal bulge seen with shoulder abduction on impingement view. Infraspinatus:  Appears normal on long and transverse views. Significant increase in Doppler flow Subscapularis:  Appears normal on long and transverse views. Positive bursa Teres Minor:  Appears normal on long and transverse views. AC joint: Mild arthritis Glenohumeral Joint:  Appears normal without effusion. Glenoid Labrum:  Intact without visualized tears. Biceps Tendon:  Appears normal on  long and transverse views, no fraying of tendon, tendon located in intertubercular groove, no subluxation with shoulder internal or external rotation.  Impression: Subacromial bursitis  Procedure: Real-time Ultrasound Guided Injection of right glenohumeral joint Device: GE Logiq E  Ultrasound guided injection is preferred based studies that show increased duration, increased effect, greater accuracy, decreased procedural pain, increased response rate with ultrasound guided versus blind injection.  Verbal informed consent obtained.  Time-out conducted.  Noted no overlying erythema, induration, or other signs of local infection.  Skin prepped in a sterile fashion.  Local anesthesia: Topical Ethyl chloride.  With sterile technique and under real time ultrasound guidance:  Joint visualized.  23g 1  inch needle inserted posterior approach. Pictures taken for needle placement. Patient did have injection of 2 cc of 1% lidocaine, 2 cc of 0.5% Marcaine, and 1.0 cc of Kenalog 40 mg/dL. Completed without difficulty  Pain immediately resolved  suggesting accurate placement of the medication.  Advised to call if fevers/chills, erythema, induration, drainage, or persistent bleeding.  Images permanently stored and available for review in the ultrasound unit.  Impression: Technically successful ultrasound guided injection.   Osteopathic findings C4 flexed rotated and side bent left C6 flexed rotated and side bent left T3 extended rotated and side bent right inhaled third rib T6 extended rotated and side bent left L2 flexed rotated and side bent right Sacrum right on right     Impression and Recommendations:     This case required medical decision making of moderate complexity. The above documentation has been reviewed and is accurate and complete Judi Saa, DO       Note: This dictation was prepared with Dragon dictation along with smaller phrase technology. Any transcriptional errors that result from this process are unintentional.

## 2018-09-05 ENCOUNTER — Ambulatory Visit: Payer: Medicare Other | Admitting: Family Medicine

## 2018-09-05 ENCOUNTER — Other Ambulatory Visit: Payer: Self-pay | Admitting: Neurosurgery

## 2018-09-07 ENCOUNTER — Encounter: Payer: Self-pay | Admitting: Family Medicine

## 2018-09-18 ENCOUNTER — Other Ambulatory Visit: Payer: Self-pay | Admitting: Neurology

## 2018-09-18 ENCOUNTER — Encounter: Payer: Self-pay | Admitting: Family Medicine

## 2018-09-18 MED ORDER — ARMODAFINIL 150 MG PO TABS: 150.0000 mg | ORAL_TABLET | ORAL | 1 refills | Status: DC

## 2018-09-20 ENCOUNTER — Ambulatory Visit: Payer: Self-pay

## 2018-09-20 ENCOUNTER — Ambulatory Visit (INDEPENDENT_AMBULATORY_CARE_PROVIDER_SITE_OTHER): Payer: Medicare Other | Admitting: Family Medicine

## 2018-09-20 ENCOUNTER — Encounter: Payer: Self-pay | Admitting: Family Medicine

## 2018-09-20 VITALS — BP 100/78 | HR 66 | Ht 74.0 in | Wt 168.0 lb

## 2018-09-20 DIAGNOSIS — G8929 Other chronic pain: Secondary | ICD-10-CM | POA: Diagnosis not present

## 2018-09-20 DIAGNOSIS — M19011 Primary osteoarthritis, right shoulder: Secondary | ICD-10-CM | POA: Diagnosis not present

## 2018-09-20 DIAGNOSIS — M25511 Pain in right shoulder: Secondary | ICD-10-CM | POA: Diagnosis not present

## 2018-09-20 NOTE — Assessment & Plan Note (Signed)
Repeat injection given today.  Discussed icing regimen and home exercise.  Discussed which activities doing which wants to avoid.  Increase activity as tolerated.  Follow-up in 4 to 8 weeks

## 2018-09-20 NOTE — Telephone Encounter (Signed)
There is no directions on Nuvigil. Dosage? How many times a day? Please advise.

## 2018-09-20 NOTE — Patient Instructions (Signed)
Biagio Borg are awesome See me again in 6 weeks

## 2018-09-20 NOTE — Progress Notes (Signed)
[name] [address] [place], [place] [address] Phone: [phone] Subjective:   Ian [name], am serving as a scribe for Dr. Antoine Primas. 26 I'm   CC: Right shoulder pain worsening  BJY:NWGNFAOZHY  Ian [name] is a 57 y.o. male coming in with complaint of right shoulder pain. Continues to pain with overhead movements. Does have pain over AC joint. Shoulder capsule injection from last visit has provided relief.  States that he is feeling 85% better from the maintenance dose but unfortunately still having pain on the top of the shoulder.       Past Medical History:  Diagnosis Date  . History of kidney stones    passed  . PD (Parkinson's disease) (HCC) 09/10/2013  . PONV (postoperative nausea and vomiting)    nausea    Past Surgical History:  Procedure Laterality Date  . COLONOSCOPY    . MINOR PLACEMENT OF FIDUCIAL N/A 01/31/2017   Procedure: MINOR PLACEMENT OF FIDUCIAL;  Surgeon: Maeola Harman, MD;  Location: [facility] OR;  Service: Neurosurgery;  Laterality: N/A;  Fiducial placement  . none    . PULSE GENERATOR IMPLANT Right 02/17/2017   Procedure: Right implantable pulse generator placement;  Surgeon: Maeola Harman, MD;  Location: [facility] OR;  Service: Neurosurgery;  Laterality: Right;  Bilateral implantable pulse generator placement  . SUBTHALAMIC STIMULATOR INSERTION Bilateral 02/10/2017   Procedure: Bilateral Deep brain stimulator placement;  Surgeon: Maeola Harman, MD;  Location: [facility] OR;  Service: Neurosurgery;  Laterality: Bilateral;  Bilateral Deep brain stimulator placement  . UPPER GASTROINTESTINAL ENDOSCOPY     Social History   Socioeconomic History  . Marital status: Married    Spouse name: Not on file  . Number of children: Not on file  . Years of education: Not on file  . Highest education level: Not on file  Occupational History  . Not on file  Social Needs  . Financial resource strain: Not on file  . Food insecurity:    Worry: Not  on file    Inability: Not on file  . Transportation needs:    Medical: Not on file    Non-medical: Not on file  Tobacco Use  . Smoking status: Never Smoker  . Smokeless tobacco: Never Used  Substance and Sexual Activity  . Alcohol use: Yes    Comment: Occ  . Drug use: No  . Sexual activity: Not on file  Lifestyle  . Physical activity:    Days per week: Not on file    Minutes per session: Not on file  . Stress: Not on file  Relationships  . Social connections:    Talks on phone: Not on file    Gets together: Not on file    Attends religious service: Not on file    Active member of club or organization: Not on file    Attends meetings of clubs or organizations: Not on file    Relationship status: Not on file  Other Topics Concern  . Not on file  Social History Narrative  . Not on file   Allergies  Allergen Reactions  . No Known Allergies    Family History  Problem Relation Age of Onset  . Diabetes Other   . Cancer Other          Current Outpatient Medications (Other):  [name]  Armodafinil (NUVIGIL) 150 MG tablet, Take 1 tablet (150 mg total) by mouth every morning. .  Ascorbic Acid (VITAMIN C) 1000 MG tablet,  Take 5,000 mg by mouth daily. .  carbidopa-levodopa (SINEMET IR) 25-100 MG tablet, Take 1 tablet by mouth as needed. .  Carbidopa-Levodopa ER ([name]) 36.25-145 MG CPCR, Take 1 tablet by mouth See admin instructions. 3 in the morning, 2 in the afternoon, 2 in the evening .  Cholecalciferol (VITAMIN D-3) 5000 units TABS, Take 5,000 Units by mouth daily. .  clonazePAM (KLONOPIN) 0.5 MG tablet, Take 0.5 tablets (0.25 mg total) by mouth at bedtime. [name]  LECITHIN PO, Take 1 capsule by mouth daily.  .  Levodopa (INBRIJA) 42 MG CAPS, Place 2 capsules into inhaler and inhale 5 (five) times daily as needed. .  modafinil (PROVIGIL) 200 MG tablet, Take 1 tablet (200 mg total) by mouth 2 (two) times daily. 1 in AM and 1 at noon .  Multiple Vitamin (MULTI-VITAMINS) TABS, Take 1  tablet by mouth 2 (two) times daily.  .  niacin 500 MG tablet, Take 500 mg by mouth 3 (three) times daily with meals. .  vitamin E 400 UNIT capsule, Take 400 Units by mouth daily.     Past medical history, social, surgical and family history all reviewed in electronic medical record.  No pertanent information unless stated regarding to the chief complaint.   Review of Systems:  No headache, visual changes, nausea, vomiting, diarrhea, constipation, dizziness, abdominal pain, skin rash, fevers, chills, night sweats, weight loss, swollen lymph nodes, body aches, joint swelling,chest pain, shortness of breath, mood changes.  Positive muscle aches  Objective  Blood pressure 100/78, pulse 66, height 6\' 2"  (1.88 m), weight 168 lb (76.2 kg), SpO2 96 %.     General: No apparent distress alert and oriented x3 mood and affect normal, dressed appropriately.  HEENT: Pupils equal, extraocular movements intact  Respiratory: Patient's speak in full sentences and does not appear short of breath  Cardiovascular: No lower extremity edema, non tender, no erythema  Skin: Warm dry intact with no signs of infection or rash on extremities or on axial skeleton.  Abdomen: Soft nontender  Neuro: Cranial nerves II through XII are intact, neurovascularly intact in all extremities with 2+ DTRs and 2+ pulses.  Lymph: No lymphadenopathy of posterior or anterior cervical chain or axillae bilaterally.  Gait normal with good balance and coordination.  MSK:  Non tender with full range of motion and good stability and symmetric strength and tone of , elbows, wrist, hip, knee and ankles bilaterally.  Mild increase in tonicity also has a very mild resting tremor on the right upper extremity Right shoulder exam shows a positive crossover as well as a positive pain over the acromial clavicular joint.  Minimal impingement otherwise.  Rotator cuff strength 5 out of 5.  Procedure: Real-time Ultrasound Guided Injection of the right  acromioclavicular joint Device: GE Logiq Q7 Ultrasound guided injection is preferred based studies that show increased duration, increased effect, greater accuracy, decreased procedural pain, increased response rate, and decreased cost with ultrasound guided versus blind injection.  Verbal informed consent obtained.  Time-out conducted.  Noted no overlying erythema, induration, or other signs of local infection.  Skin prepped in a sterile fashion.  Local anesthesia: Topical Ethyl chloride.  With sterile technique and under real time ultrasound guidance: With a 25-gauge 1 inch needle patient was injected with 0.5 cc of 0.5% Marcaine and 0.5 cc of Kenalog 40 mg/mL into the right acromioclavicular joint Completed without difficulty  Pain immediately resolved suggesting accurate placement of the medication.  Advised to call if fevers/chills, erythema, induration, drainage,  or persistent bleeding.  Images permanently stored and available for review in the ultrasound unit.  Impression: Technically successful ultrasound guided injection.   Impression and Recommendations:     This case required medical decision making of moderate complexity. The above documentation has been reviewed and is accurate and complete Lyndal Pulley, DO       Note: This dictation was prepared with Dragon dictation along with smaller phrase technology. Any transcriptional errors that result from this process are unintentional.

## 2018-09-21 MED ORDER — ARMODAFINIL 150 MG PO TABS: 150.0000 mg | ORAL_TABLET | ORAL | 1 refills | Status: DC

## 2018-10-04 ENCOUNTER — Encounter: Payer: Self-pay | Admitting: Neurology

## 2018-10-12 NOTE — Pre-Procedure Instructions (Signed)
Ian [name]  10/12/2018      [id number] - [place], [place] - [address] [place] [address] Phone: [phone] Fax: [phone]  [facility] - [place], [place] - [address] [place] [address] Phone: [phone] Fax: [phone]  [facility] [address] [place], [place] - [address] [place] [address] Phone: [phone] Fax: [phone]  [id number] [place], [place] - [address] AT [facility] [address] [facility] [address] & [facility] [address] [place] [address] Phone: [phone] Fax: [phone]    Your procedure is scheduled on 10/19/18.  Report to [facility] Admitting at 530 A.M.  Call this number if you have problems the morning of surgery:  [phone]   Remember:  Do not eat or drink after midnight.  Y    Take these medicines the morning of surgery with A SIP OF WATER ---sinemet,,levodopa    Do not wear jewelry, make-up or nail polish.  Do not wear lotions, powders, or perfumes, or deodorant.  Do not shave 48 hours prior to surgery.  Men may shave face and neck.  Do not bring valuables to the hospital.  [facility] is not responsible for any belongings or valuables.  Contacts, dentures or bridgework may not be worn into surgery.  Leave your suitcase in the car.  After surgery it may be brought to your room.  For patients admitted to the hospital, discharge time will be determined by your treatment team.  Patients discharged the day of surgery will not be allowed to drive home.   Name and phone number of your driver:    Special instructions:  Do not take any aspirin,anti-inflammatories,vitamins,or herbal supplements 5-7 days prior to surgery. [facility] - Preparing for Surgery  Before surgery, you can play an important role.  Because skin is not sterile, your skin  needs to be as free of germs as possible.  You can reduce the number of germs on you skin by washing with CHG (chlorahexidine gluconate) soap before surgery.  CHG is an antiseptic cleaner which kills germs and bonds with the skin to continue killing germs even after washing.  Oral Hygiene is also important in reducing the risk of infection.  Remember to brush your teeth with your regular toothpaste the morning of surgery.  Please DO NOT use if you have an allergy to CHG or antibacterial soaps.  If your skin becomes reddened/irritated stop using the CHG and inform your nurse when you arrive at Short Stay.  Do not shave (including legs and underarms) for at least 48 hours prior to the first CHG shower.  You may shave your face.  Please follow these instructions carefully:   1.  Shower with CHG Soap the night before surgery and the morning of Surgery.  2.  If you choose to wash your hair, wash your hair first as usual with your normal shampoo.  3.  After you shampoo, rinse your hair and body thoroughly to remove the shampoo. 4.  Use CHG as you would any other liquid soap.  You can apply chg directly to the skin and wash gently with a      scrungie or washcloth.           5.  Apply the CHG Soap to your body ONLY FROM THE NECK DOWN.   Do not use on open wounds or open  sores. Avoid contact with your eyes, ears, mouth and genitals (private parts).  Wash genitals (private parts) with your normal soap.  6.  Wash thoroughly, paying special attention to the area where your surgery will be performed.  7.  Thoroughly rinse your body with warm water from the neck down.  8.  DO NOT shower/wash with your normal soap after using and rinsing off the CHG Soap.  9.  Pat yourself dry with a clean towel.            10.  Wear clean pajamas.            11.  Place clean sheets on your bed the night of your first shower and do not sleep with pets.  Day of Surgery  Do not apply any lotions/deoderants the morning of  surgery.   Please wear clean clothes to the hospital/surgery center. Remember to brush your teeth with toothpaste.   Please read over the following fact sheets that you were given.

## 2018-10-15 ENCOUNTER — Encounter (HOSPITAL_COMMUNITY)
Admission: RE | Admit: 2018-10-15 | Discharge: 2018-10-15 | Disposition: A | Discharge status: Home / Self Care | Payer: Medicare Other | Source: Ambulatory Visit | Attending: Neurosurgery | Admitting: Neurosurgery

## 2018-10-15 ENCOUNTER — Encounter (HOSPITAL_COMMUNITY): Payer: Self-pay

## 2018-10-15 DIAGNOSIS — Z01812 Encounter for preprocedural laboratory examination: Secondary | ICD-10-CM | POA: Insufficient documentation

## 2018-10-15 HISTORY — DX: Anxiety disorder, unspecified: F41.9

## 2018-10-15 LAB — BASIC METABOLIC PANEL
ANION GAP: 8 (ref 5–15)
BUN: 16 mg/dL (ref 6–20)
CHLORIDE: 105 mmol/L (ref 98–111)
CO2: 28 mmol/L (ref 22–32)
Calcium: 9.7 mg/dL (ref 8.9–10.3)
Creatinine, Ser: 1.05 mg/dL (ref 0.61–1.24)
GFR calc Af Amer: >60 mL/min (ref >60)
GLUCOSE: 79 mg/dL (ref 70–99)
POTASSIUM: 4.4 mmol/L (ref 3.5–5.1)
SODIUM: 141 mmol/L (ref 135–145)

## 2018-10-15 LAB — CBC
HEMATOCRIT: 48.2 % (ref 39.0–52.0)
Hemoglobin: 15.4 g/dL (ref 13.0–17.0)
MCH: 29.3 pg (ref 26.0–34.0)
MCHC: 32 g/dL (ref 30.0–36.0)
MCV: 91.6 fL (ref 80.0–100.0)
PLATELETS: 302 10*3/uL (ref 150–400)
RBC: 5.26 MIL/uL (ref 4.22–5.81)
RDW: 13.7 % (ref 11.5–15.5)
WBC: 6.5 10*3/uL (ref 4.0–10.5)
nRBC: 0 % (ref 0.0–0.2)

## 2018-10-15 MED ORDER — CHLORHEXIDINE GLUCONATE CLOTH 2 % EX PADS: 6.0000 | MEDICATED_PAD | Freq: Once | CUTANEOUS | Status: DC

## 2018-10-19 ENCOUNTER — Encounter (HOSPITAL_COMMUNITY)
Admission: RE | Disposition: A | Discharge status: Home / Self Care | Payer: Self-pay | Source: Ambulatory Visit | Attending: Neurosurgery

## 2018-10-19 ENCOUNTER — Encounter (HOSPITAL_COMMUNITY): Payer: Self-pay | Admitting: *Deleted

## 2018-10-19 ENCOUNTER — Ambulatory Visit (HOSPITAL_COMMUNITY)
Admission: RE | Admit: 2018-10-19 | Discharge: 2018-10-19 | Disposition: A | Discharge status: Home / Self Care | Payer: Medicare Other | Source: Ambulatory Visit | Attending: Neurosurgery | Admitting: Neurosurgery

## 2018-10-19 ENCOUNTER — Other Ambulatory Visit: Payer: Self-pay

## 2018-10-19 ENCOUNTER — Ambulatory Visit (HOSPITAL_COMMUNITY): Payer: Medicare Other | Admitting: Anesthesiology

## 2018-10-19 DIAGNOSIS — Z4542 Encounter for adjustment and management of neuropacemaker (brain) (peripheral nerve) (spinal cord): Secondary | ICD-10-CM | POA: Diagnosis not present

## 2018-10-19 DIAGNOSIS — G20 Parkinson's disease: Secondary | ICD-10-CM | POA: Insufficient documentation

## 2018-10-19 DIAGNOSIS — Z79899 Other long term (current) drug therapy: Secondary | ICD-10-CM | POA: Insufficient documentation

## 2018-10-19 HISTORY — PX: SUBTHALAMIC STIMULATOR BATTERY REPLACEMENT: SHX5405

## 2018-10-19 SURGERY — SUBTHALAMIC STIMULATOR BATTERY REPLACEMENT
Anesthesia: Monitor Anesthesia Care

## 2018-10-19 MED ORDER — HYDROMORPHONE HCL 1 MG/ML IJ SOLN: 0.2500 mg | INTRAMUSCULAR | Status: DC | PRN

## 2018-10-19 MED ORDER — BUPIVACAINE HCL (PF) 0.5 % IJ SOLN
INTRAMUSCULAR | Status: AC
  Filled 2018-10-19: qty 30

## 2018-10-19 MED ORDER — BUPIVACAINE HCL (PF) 0.5 % IJ SOLN
INTRAMUSCULAR | Status: DC | PRN
  Administered 2018-10-19: 8 mL

## 2018-10-19 MED ORDER — LIDOCAINE 2% (20 MG/ML) 5 ML SYRINGE
INTRAMUSCULAR | Status: AC
  Filled 2018-10-19: qty 5

## 2018-10-19 MED ORDER — DEXAMETHASONE SODIUM PHOSPHATE 10 MG/ML IJ SOLN
INTRAMUSCULAR | Status: DC | PRN
  Administered 2018-10-19: 10 mg via INTRAVENOUS

## 2018-10-19 MED ORDER — ONDANSETRON HCL 4 MG/2ML IJ SOLN
INTRAMUSCULAR | Status: DC | PRN
  Administered 2018-10-19: 4 mg via INTRAVENOUS

## 2018-10-19 MED ORDER — PROPOFOL 10 MG/ML IV BOLUS
INTRAVENOUS | Status: AC
  Filled 2018-10-19: qty 20

## 2018-10-19 MED ORDER — PROPOFOL 500 MG/50ML IV EMUL
INTRAVENOUS | Status: DC | PRN
  Administered 2018-10-19: 100 ug/kg/min via INTRAVENOUS

## 2018-10-19 MED ORDER — MIDAZOLAM HCL 2 MG/2ML IJ SOLN
INTRAMUSCULAR | Status: DC | PRN
  Administered 2018-10-19: 2 mg via INTRAVENOUS

## 2018-10-19 MED ORDER — DEXAMETHASONE SODIUM PHOSPHATE 10 MG/ML IJ SOLN
INTRAMUSCULAR | Status: AC
  Filled 2018-10-19: qty 1

## 2018-10-19 MED ORDER — LIDOCAINE-EPINEPHRINE 1 %-1:100000 IJ SOLN
INTRAMUSCULAR | Status: DC | PRN
  Administered 2018-10-19: 8 mL

## 2018-10-19 MED ORDER — PROMETHAZINE HCL 25 MG/ML IJ SOLN: 6.2500 mg | INTRAMUSCULAR | Status: DC | PRN

## 2018-10-19 MED ORDER — VANCOMYCIN HCL 1000 MG IV SOLR
INTRAVENOUS | Status: DC | PRN
  Administered 2018-10-19: 1000 mg

## 2018-10-19 MED ORDER — 0.9 % SODIUM CHLORIDE (POUR BTL) OPTIME
TOPICAL | Status: DC | PRN
  Administered 2018-10-19: 1000 mL

## 2018-10-19 MED ORDER — LIDOCAINE-EPINEPHRINE 1 %-1:100000 IJ SOLN
INTRAMUSCULAR | Status: AC
  Filled 2018-10-19: qty 1

## 2018-10-19 MED ORDER — MIDAZOLAM HCL 2 MG/2ML IJ SOLN
INTRAMUSCULAR | Status: AC
  Filled 2018-10-19: qty 2

## 2018-10-19 MED ORDER — BACITRACIN ZINC 500 UNIT/GM EX OINT
TOPICAL_OINTMENT | CUTANEOUS | Status: DC | PRN
  Administered 2018-10-19: 1 via TOPICAL

## 2018-10-19 MED ORDER — BACITRACIN ZINC 500 UNIT/GM EX OINT
TOPICAL_OINTMENT | CUTANEOUS | Status: AC
  Filled 2018-10-19: qty 28.35

## 2018-10-19 MED ORDER — CEFAZOLIN SODIUM-DEXTROSE 2-4 GM/100ML-% IV SOLN
2.0000 g | INTRAVENOUS | Status: AC
  Administered 2018-10-19: 2 g via INTRAVENOUS
  Filled 2018-10-19: qty 100

## 2018-10-19 MED ORDER — VANCOMYCIN HCL 1000 MG IV SOLR
INTRAVENOUS | Status: AC
  Filled 2018-10-19: qty 1000

## 2018-10-19 MED ORDER — LIDOCAINE 2% (20 MG/ML) 5 ML SYRINGE
INTRAMUSCULAR | Status: DC | PRN
  Administered 2018-10-19: 40 mg via INTRAVENOUS

## 2018-10-19 MED ORDER — ONDANSETRON HCL 4 MG/2ML IJ SOLN
INTRAMUSCULAR | Status: AC
  Filled 2018-10-19: qty 2

## 2018-10-19 MED ORDER — ROCURONIUM BROMIDE 50 MG/5ML IV SOSY
PREFILLED_SYRINGE | INTRAVENOUS | Status: AC
  Filled 2018-10-19: qty 5

## 2018-10-19 MED ORDER — FENTANYL CITRATE (PF) 250 MCG/5ML IJ SOLN
INTRAMUSCULAR | Status: AC
  Filled 2018-10-19: qty 5

## 2018-10-19 MED ORDER — LACTATED RINGERS IV SOLN
INTRAVENOUS | Status: DC | PRN
  Administered 2018-10-19: 07:00:00 via INTRAVENOUS

## 2018-10-19 SURGICAL SUPPLY — 42 items
CANISTER SUCT 3000ML PPV (MISCELLANEOUS) ×3 IMPLANT
CARTRIDGE OIL MAESTRO DRILL (MISCELLANEOUS) ×1 IMPLANT
COVER WAND RF STERILE (DRAPES) ×3 IMPLANT
DECANTER SPIKE VIAL GLASS SM (MISCELLANEOUS) ×3 IMPLANT
DERMABOND ADVANCED (GAUZE/BANDAGES/DRESSINGS) ×2
DERMABOND ADVANCED .7 DNX12 (GAUZE/BANDAGES/DRESSINGS) ×1 IMPLANT
DIFFUSER DRILL AIR PNEUMATIC (MISCELLANEOUS) ×3 IMPLANT
DRAPE LAPAROTOMY 100X72 PEDS (DRAPES) ×3 IMPLANT
DRAPE POUCH INSTRU U-SHP 10X18 (DRAPES) ×3 IMPLANT
DRSG OPSITE POSTOP 4X6 (GAUZE/BANDAGES/DRESSINGS) ×3 IMPLANT
DURAPREP 26ML APPLICATOR (WOUND CARE) ×3 IMPLANT
GAUZE 4X4 16PLY RFD (DISPOSABLE) IMPLANT
GLOVE BIO SURGEON STRL SZ7 (GLOVE) ×6 IMPLANT
GLOVE BIO SURGEON STRL SZ8 (GLOVE) ×3 IMPLANT
GLOVE BIOGEL PI IND STRL 8 (GLOVE) ×1 IMPLANT
GLOVE BIOGEL PI IND STRL 8.5 (GLOVE) ×1 IMPLANT
GLOVE BIOGEL PI INDICATOR 8 (GLOVE) ×2
GLOVE BIOGEL PI INDICATOR 8.5 (GLOVE) ×2
GLOVE ECLIPSE 8.0 STRL XLNG CF (GLOVE) ×3 IMPLANT
GLOVE SS N UNI LF 6.5 STRL (GLOVE) ×9 IMPLANT
GLOVE SURG SS PI 8.0 STRL IVOR (GLOVE) ×3 IMPLANT
GOWN STRL REUS W/ TWL LRG LVL3 (GOWN DISPOSABLE) ×2 IMPLANT
GOWN STRL REUS W/ TWL XL LVL3 (GOWN DISPOSABLE) ×2 IMPLANT
GOWN STRL REUS W/TWL 2XL LVL3 (GOWN DISPOSABLE) IMPLANT
GOWN STRL REUS W/TWL LRG LVL3 (GOWN DISPOSABLE) ×4
GOWN STRL REUS W/TWL XL LVL3 (GOWN DISPOSABLE) ×4
KIT BASIN OR (CUSTOM PROCEDURE TRAY) ×3 IMPLANT
KIT IMPLANT PULSE GENERATOR (Generator) ×3 IMPLANT
KIT REMOTE CONTROL 3 VERCISE (KITS) ×3 IMPLANT
KIT TURNOVER KIT B (KITS) ×3 IMPLANT
NEEDLE HYPO 25X1 1.5 SAFETY (NEEDLE) ×3 IMPLANT
NS IRRIG 1000ML POUR BTL (IV SOLUTION) ×3 IMPLANT
OIL CARTRIDGE MAESTRO DRILL (MISCELLANEOUS) ×3
PACK LAMINECTOMY NEURO (CUSTOM PROCEDURE TRAY) ×3 IMPLANT
PAD ARMBOARD 7.5X6 YLW CONV (MISCELLANEOUS) ×9 IMPLANT
SUT SILK 0 (SUTURE) ×2
SUT SILK 0 MO-6 18XCR BRD 8 (SUTURE) ×1 IMPLANT
SUT VIC AB 2-0 CP2 18 (SUTURE) ×3 IMPLANT
SUT VIC AB 3-0 SH 8-18 (SUTURE) ×3 IMPLANT
TOWEL GREEN STERILE (TOWEL DISPOSABLE) ×3 IMPLANT
TOWEL GREEN STERILE FF (TOWEL DISPOSABLE) ×3 IMPLANT
WATER STERILE IRR 1000ML POUR (IV SOLUTION) ×3 IMPLANT

## 2018-10-19 NOTE — Anesthesia Preprocedure Evaluation (Addendum)
Anesthesia Evaluation  Patient identified by MRN, date of birth, ID band Patient awake    Reviewed: Allergy & Precautions, NPO status , Patient's Chart, lab work & pertinent test results  History of Anesthesia Complications (+) PONV  Airway Mallampati: II  TM Distance: >3 FB Neck ROM: Full    Dental no notable dental hx.    Pulmonary neg pulmonary ROS,    Pulmonary exam normal breath sounds clear to auscultation       Cardiovascular negative cardio ROS Normal cardiovascular exam Rhythm:Regular Rate:Normal     Neuro/Psych Parkinsons dz  Neuromuscular disease    GI/Hepatic negative GI ROS, Neg liver ROS,   Endo/Other  negative endocrine ROS  Renal/GU negative Renal ROS  negative genitourinary   Musculoskeletal negative musculoskeletal ROS (+)   Abdominal   Peds negative pediatric ROS (+)  Hematology negative hematology ROS (+)   Anesthesia Other Findings   Reproductive/Obstetrics negative OB ROS                             Anesthesia Physical Anesthesia Plan  ASA: III  Anesthesia Plan: MAC   Post-op Pain Management:    Induction: Intravenous  PONV Risk Score and Plan: 2 and Ondansetron, Dexamethasone and Treatment may vary due to age or medical condition  Airway Management Planned: Nasal Cannula  Additional Equipment:   Intra-op Plan:   Post-operative Plan:   Informed Consent: I have reviewed the patients History and Physical, chart, labs and discussed the procedure including the risks, benefits and alternatives for the proposed anesthesia with the patient or authorized representative who has indicated his/her understanding and acceptance.   Dental advisory given  Plan Discussed with: CRNA and Surgeon  Anesthesia Plan Comments:        Anesthesia Quick Evaluation

## 2018-10-19 NOTE — Anesthesia Postprocedure Evaluation (Signed)
Anesthesia Post Note  Patient: Ian [name]  Procedure([name]) Performed: Deep Brain stimulator implantable pulse generator revision (N/A )     Patient location during evaluation: PACU Anesthesia Type: MAC Level of consciousness: awake and alert Pain management: pain level controlled Vital Signs Assessment: post-procedure vital signs reviewed and stable Respiratory status: spontaneous breathing, nonlabored ventilation, respiratory function stable and patient connected to nasal cannula oxygen Cardiovascular status: stable and blood pressure returned to baseline Postop Assessment: no apparent nausea or vomiting Anesthetic complications: no    Last Vitals:  Vitals:   10/19/18 0823 10/19/18 0837  BP: (!) 99/46 97/61  Pulse: 69 60  Resp: 16 13  Temp: (!) 36.4 C   SpO2: 100% 100%    Last Pain:  Vitals:   10/19/18 0823  TempSrc:   PainSc: Asleep                 Ian [name]

## 2018-10-19 NOTE — Op Note (Signed)
10/19/2018  8:14 AM  PATIENT:  Ian [name]  57 y.o. male  PRE-OPERATIVE DIAGNOSIS:  Parkinson Disease  POST-OPERATIVE DIAGNOSIS:  Parkinson Disease  PROCEDURE:  Procedure(s) with comments: Deep Brain stimulator implantable pulse generator revision (N/A) - Deep Brain stimulator implantable pulse generator revision  SURGEON:  Surgeon(s) and Role:    * Devra Stare, MD - Primary  PHYSICIAN ASSISTANT:   ASSISTANTS: none   ANESTHESIA:   local and IV sedation  EBL: Minimal  BLOOD ADMINISTERED:none  DRAINS: none   LOCAL MEDICATIONS USED:  MARCAINE    and LIDOCAINE   SPECIMEN:  No Specimen  DISPOSITION OF SPECIMEN:  N/A  COUNTS:  YES  TOURNIQUET:  * No tourniquets in log *  DICTATION: DICTATION: Patient has implanted subthalamic stimulator electrodes and [employer] non-MRI compatible IPG, which is now depleted.  It was elected for patient to undergo IPG revision.  PROCEDURE: Patient was brought to the operating room and given intravenous sedation.  Right upper chest was prepped with betadine scrub and paint.  Area of planned incision was infiltrated with lidocaine.  Prior incision was reopened and the old IPG was externalized. New IPG  was connected and placed in the pocket.  IPG was anchored with a single silk stitch.  Wound was irrigated with vancomycin. Then irrigated once more.  Incision was closed with 2-0 Vicryl and 3-0 vicryl sutures and dressed with a sterile occlusive dressing.  Counts were correct at the end of the case.  PLAN OF CARE: Discharge to home after PACU  PATIENT DISPOSITION:  PACU - hemodynamically stable.   Delay start of Pharmacological VTE agent (>24hrs) due to surgical blood loss or risk of bleeding: yes  

## 2018-10-19 NOTE — Transfer of Care (Signed)
Immediate Anesthesia Transfer of Care Note  Patient: Ian [name]  Procedure(s) Performed: Deep Brain stimulator implantable pulse generator revision (N/A )  Patient Location: PACU  Anesthesia Type:MAC  Level of Consciousness: drowsy  Airway & Oxygen Therapy: Patient Spontanous Breathing  Post-op Assessment: Report given to RN and Post -op Vital signs reviewed and stable  Post vital signs: Reviewed and stable  Last Vitals:  Vitals Value Taken Time  BP 99/46 10/19/2018  8:23 AM  Temp 36.4 C 10/19/2018  8:23 AM  Pulse 64 10/19/2018  8:25 AM  Resp 18 10/19/2018  8:25 AM  SpO2 100 % 10/19/2018  8:25 AM  Vitals shown include unvalidated device data.  Last Pain:  Vitals:   10/19/18 0558  TempSrc:   PainSc: 0-No pain      Patients Stated Pain Goal: 0 (56/38/93 7342)  Complications: No apparent anesthesia complications

## 2018-10-19 NOTE — Interval H&P Note (Signed)
History and Physical Interval Note:  10/19/2018 7:33 AM  Ian [name]  has presented today for surgery, with the diagnosis of Parkinson Disease  The various methods of treatment have been discussed with the patient and family. After consideration of risks, benefits and other options for treatment, the patient has consented to  Procedure(s) with comments: Deep Brain stimulator implantable pulse generator revision (N/A) - Deep Brain stimulator implantable pulse generator revision as a surgical intervention .  The patient's history has been reviewed, patient examined, no change in status, stable for surgery.  I have reviewed the patient's chart and labs.  Questions were answered to the patient's satisfaction.     Dorian Heckle

## 2018-10-19 NOTE — Brief Op Note (Signed)
10/19/2018  8:14 AM  PATIENT:  Ian [name]  57 y.o. male  PRE-OPERATIVE DIAGNOSIS:  Parkinson Disease  POST-OPERATIVE DIAGNOSIS:  Parkinson Disease  PROCEDURE:  Procedure(s) with comments: Deep Brain stimulator implantable pulse generator revision (N/A) - Deep Brain stimulator implantable pulse generator revision  SURGEON:  Surgeon(s) and Role:    Maeola Harman, MD - Primary  PHYSICIAN ASSISTANT:   ASSISTANTS: none   ANESTHESIA:   local and IV sedation  EBL: Minimal  BLOOD ADMINISTERED:none  DRAINS: none   LOCAL MEDICATIONS USED:  MARCAINE    and LIDOCAINE   SPECIMEN:  No Specimen  DISPOSITION OF SPECIMEN:  N/A  COUNTS:  YES  TOURNIQUET:  * No tourniquets in log *  DICTATION: DICTATION: Patient has implanted subthalamic stimulator electrodes and [employer] non-MRI compatible IPG, which is now depleted.  It was elected for patient to undergo IPG revision.  PROCEDURE: Patient was brought to the operating room and given intravenous sedation.  Right upper chest was prepped with betadine scrub and paint.  Area of planned incision was infiltrated with lidocaine.  Prior incision was reopened and the old IPG was externalized. New IPG  was connected and placed in the pocket.  IPG was anchored with a single silk stitch.  Wound was irrigated with vancomycin. Then irrigated once more.  Incision was closed with 2-0 Vicryl and 3-0 vicryl sutures and dressed with a sterile occlusive dressing.  Counts were correct at the end of the case.  PLAN OF CARE: Discharge to home after PACU  PATIENT DISPOSITION:  PACU - hemodynamically stable.   Delay start of Pharmacological VTE agent (>24hrs) due to surgical blood loss or risk of bleeding: yes

## 2018-10-19 NOTE — H&P (Signed)
Patient ID:   [id number] Patient: Ian [name]  Date of Birth: [birth year] Visit Type: Office Visit   Date: 08/31/2018 02:30 PM Provider: Danae Orleans. Venetia Maxon MD   This 57 year old male presents for DBS consult.  HISTORY OF PRESENT ILLNESS:  1.  DBS consult  Patient returns hoping to replace his current DBS pulse generator with an MRI compatible [employer].  He has been doing quite well with management of his Parkinson's disease.  He needs to have MRIs done and wants to go ahead with a rechargeable battery that is MRI compatible.  He wishes to set this in November.         Medical/Surgical/Interim History Reviewed, no change.  Last detailed document date:04/27/2016.     PAST MEDICAL HISTORY, SURGICAL HISTORY, FAMILY HISTORY, SOCIAL HISTORY AND REVIEW OF SYSTEMS I have reviewed the patient's past medical, surgical, family and social history as well as the comprehensive review of systems as included on the [facility] history form dated 04/27/2016, which I have signed.  Family History:  Reviewed, no changes.  Last detailed document date:04/27/2016.   Social History: Reviewed, no changes. Last detailed document date: 04/27/2016.    MEDICATIONS: (added, continued or stopped this visit) Started Medication Directions Instruction Stopped   Requip 4 mg tablet take 2 tablet by oral route  every day     [name] 48.75 mg-195 mg capsule,extended release take 1 capsule by oral route 3 times every day       ALLERGIES: Ingredient Reaction Medication Name Comment  NO KNOWN ALLERGIES     No known allergies.    PHYSICAL EXAM:   Vitals Date Temp F BP Pulse Ht In Wt Lb BMI BSA Pain Score  08/31/2018  128/73 85 74 168 21.57  0/10      IMPRESSION:   Proceed with rechargeable battery change for MRI compatible device  PLAN:  Schedule surgery in November per patient request.   Assessment/Plan   # Detail Type Description   1. Assessment  Parkinson disease (G20).                     Provider:  Danae Orleans. Venetia Maxon MD  09/01/2018 04:04 PM Dictation edited by: Danae Orleans. Venetia Maxon    CC Providers: Asencion Partridge [address] [place],  [place]  [address]   Lurena Joiner [name]  [address] [place], [place] [address]               Electronically signed by Danae Orleans Venetia Maxon MD on 09/01/2018 04:16 PM

## 2018-10-19 NOTE — Discharge Instructions (Signed)
Tylenol or ibuprofen for pain if needed. Keep dressing on for 5 days and then may remove. May shower during that time, but do not soak the dressing. Skin glue and sutures underneath dressing, leave these. Skin glue should come off by itself in a few weeks, sutures are dissolvable. Follow up with Dr. Venetia Maxon in 2-3 weeks.

## 2018-10-19 NOTE — Anesthesia Procedure Notes (Signed)
Procedure Name: MAC Date/Time: 10/19/2018 7:35 AM Performed by: Barrington Ellison, CRNA Pre-anesthesia Checklist: Patient identified, Emergency Drugs available, Suction available, Patient being monitored and Timeout performed Patient Re-evaluated:Patient Re-evaluated prior to induction Oxygen Delivery Method: Simple face mask

## 2018-10-26 ENCOUNTER — Encounter (HOSPITAL_COMMUNITY): Payer: Self-pay | Admitting: Neurosurgery

## 2018-11-01 ENCOUNTER — Ambulatory Visit: Payer: Medicare Other | Admitting: Physical Therapy

## 2018-11-01 ENCOUNTER — Ambulatory Visit: Payer: Medicare Other | Admitting: Occupational Therapy

## 2018-11-01 ENCOUNTER — Ambulatory Visit: Payer: Medicare Other | Attending: Neurology

## 2018-11-01 DIAGNOSIS — R471 Dysarthria and anarthria: Secondary | ICD-10-CM | POA: Insufficient documentation

## 2018-11-01 DIAGNOSIS — R278 Other lack of coordination: Secondary | ICD-10-CM | POA: Insufficient documentation

## 2018-11-01 DIAGNOSIS — R2689 Other abnormalities of gait and mobility: Secondary | ICD-10-CM

## 2018-11-01 NOTE — Therapy (Signed)
[facility] [address] [place], [place], [address] Phone: [phone]   Fax:  [phone]  Patient Details  Name: Ian [name] MRN: [id number] Date of Birth: 13-Oct-1961 Referring Provider:  Vladimir Fasterat, [name] S, DO  Encounter Date: 11/01/2018  Physical Therapy Parkinson's Disease Screen   Timed Up and Go test: 10.03 sec  10 meter walk test: 8.16 sec (4.02 ft/sec)  5 time sit to stand test: 11.75 sec   Patient does not require Physical Therapy services at this time.  Recommend Physical Therapy screen in 6-9 months.    MARRIOTT,AMY W. 11/01/2018, 8:30 AM  Gean MaidensMARRIOTT,AMY W., PT   [facility] [address] [place], [place], [address] Phone: [phone]   Fax:  [phone]

## 2018-11-01 NOTE — Therapy (Signed)
[facility] [address] [place], [place], [address] Phone: [phone]   Fax:  [phone]  Patient Details  Name: Ian PurserKenneth [name] MRN: [id number] Date of Birth: 10-02-1961 Referring Provider:  Vladimir Fasterat, [name] S, DO  Encounter Date: 11/01/2018 Occupational Therapy Parkinson's Disease Screen  Hand dominance:  RUE  Physical Performance Test item #2 (simulated eating):  12.75 sec  Physical Performance Test item #4 (donning/doffing jacket):  9.38 sec  Fastening/unfastening 3 buttons in:  22.13sec  9-hole peg test:    RUE  27.25  sec        LUE  36.69 sec    Change in ability to perform ADLs/IADLs:  No, better since DBS  Other Comments:  Hand writing legible and good size.  Pt does not require occupational therapy services at this time.  Recommended occupational therapy screen in   6mons.  Timika Muench 11/01/2018, 8:19 AM  [facility] [address] [place], [place], [address] Phone: [phone]   Fax:  [phone]

## 2018-11-01 NOTE — Therapy (Signed)
[facility] [address] [place], [place], [address] Phone: [phone]   Fax:  [phone]  Patient Details  Name: Ian [name] MRN: [id number] Date of Birth: January 03, 1961 Referring Provider:  Vladimir Fasterat, [name] S, DO  Encounter Date: 11/01/2018   Speech Therapy Parkinson's Disease Screen   Decibel Level today: low 70sdB  (WNL=70-72 dB) with sound level meter 30cm away from pt's mouth. Pt's conversational volume has remained unchanged since last treatment course, however pt reports he has experienced a return of his rushes of speech. Today in screen pt demonstrated rushes of speech x4 which minimally detracted his speech intelligibility.    Pt has not experienced difficulty in swallowing warranting further evaluation.  Pt would benefit from speech-language eval for dysarthria - please order via EPIC or call [phone] to schedule    [facility], ,MS, CCC-SLP  11/01/2018, 1:10 PM  [facility] [address] [place], [place], [address] Phone: [phone]   Fax:  [phone]

## 2018-11-19 ENCOUNTER — Other Ambulatory Visit: Payer: Self-pay

## 2018-11-19 MED ORDER — CLONAZEPAM 0.5 MG PO TABS: 0.2500 mg | ORAL_TABLET | Freq: Every day | ORAL | 1 refills | Status: DC

## 2018-11-19 NOTE — Telephone Encounter (Signed)
Please fill

## 2018-11-28 ENCOUNTER — Telehealth: Payer: Self-pay | Admitting: Neurology

## 2018-11-28 DIAGNOSIS — R471 Dysarthria and anarthria: Secondary | ICD-10-CM

## 2018-11-28 DIAGNOSIS — G20 Parkinson's disease: Secondary | ICD-10-CM

## 2018-11-28 NOTE — Telephone Encounter (Signed)
Order entered via Epic.

## 2018-11-28 NOTE — Telephone Encounter (Signed)
-----   Message from Barron Alvinearl B Schinke, CCC-SLP sent at 11/28/2018  8:14 AM EST ----- Recommending ST eval for dysarthria. If agreed, please order via Epic. Thanks!!

## 2019-01-21 ENCOUNTER — Other Ambulatory Visit: Payer: Self-pay | Admitting: *Deleted

## 2019-01-21 MED ORDER — CARBIDOPA-LEVODOPA ER 36.25-145 MG PO CPCR: 2.0000 | ORAL_CAPSULE | Freq: Three times a day (TID) | ORAL | 5 refills | Status: DC

## 2019-02-11 NOTE — Progress Notes (Signed)
Ian [name] was seen today in the movement disorders clinic for neurologic consultation at the request of his PCP, Dr. Drue Second.  He has previously seen  Patient, [name] and saw [name] in [place].   This patient is accompanied in the office by his spouse who supplements the history.The consultation is for the evaluation of PD.  I did review Dr. [name] notes.  Dr. Hurley Cisco notes are not available to me.    Hist first sx was tremor was R arm tremor.  It was in 2011 according to the patient today, but Dr. [name] notes indicate that perhaps this was in 2009.  He was dx with enhanced physiologic tremor.  He was started on propranolol with some help, but made him sluggish.  He went to see [name] in 2013 for an opinion in [place].  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  He was placed on baclofen and requip.  He thinks that the requip has helped; he is able to turn over in the bed better and move better.  The pt first saw Dr. [name] on 09/10/13.  Dr. [name] agreed with the Requip and start the patient on [name], but the patient is currently only on its once per day.  12/04/13 update:  The pt presents to the clinic today for his PD.  He is exercising faithfully.  He attends the PD exercise class through the rehab center. The pt was changed to the IR formulation of carbidopa/levodopa 25/100 last visit and is on it tid.  He is not sure it helps.  Afternoons are better than mornings.  carbidopa/levodopa 25/100 is taken at 8am/3:30 pm and bedtime. He is also on requip xl 6 mg daily.  Continues to have significant tremor.  01/27/14 update:  The patient presents today with his wife, who supplements the history.  The pt has a hx of PD and last visit, we decided to increase his carbidopa/levodopa to 2 po tid.  the patient states that he just felt lightheaded on this medication  dosage and up going back to [name] 3 times per day and then takes [name], half tablet twice per day. He remains on requip xl 6 mg daily.  Overall, he does feel fairly good.  He still has tremor.  He went to see Dr. [name] since our last visit and again talked about DBS therapy.  Dr. [name] mentioned to him that he looks much better and he had previously and had [name] score had declined.  Some yelling out in the sleep but this has not been a huge issue.  No sleepwalking/talking.  No falls.  He is exercising faithfully.  05/28/14 update:  The patient is presenting today for followup.  Last visit, I increased his [name] from 6 mg daily to 4 mg twice a day.  He is doing well with this.  Tremor is about the same.  He is on a strange combination of carbidopa levodopa, but when I tried to switch him off the IR version, the patient just felt lightheaded and ended up going back to a combination of IR and CR.  He is currently on 25/100 CR 3 times per day and then takes [name], half tablet twice per day and sometimes three times per day.  He does best in the evening and worst in the AM.   No swallowing troubles.  Has dropped weight but thinks that it is exercise and was trying to follow gluten free diet. He is walking, doing a spinning class for PD, and has started golfing again, which he hasn't done in years.   Still considering DBS therapy.    08/27/14 update:  Patient presents today for followup.  He is currently on [name], 4mg  twice a day.  He is on a strange combination of carbidopa levodopa, but hasn't been able to tolerate the IR version well.[name]  He is currently on 25/100 CR 2-3 times per day and then 50/200 at night.  He rarely takes the IR now; only if playing golf or if he needs a boost during the day.  He is finding that he is having more tremor.  He is also having dreams at night that are making his sleep feel unrestful.  He is strongly  considering deep brain stimulation.  He asks me about details regarding this today.  He has asks me about potentially getting a refill on his Ativan.  He apparently got this filled from an outside physician about a year and a half ago and only had 10 pills in total and is just now running out.  He takes one pill every 3 or 4 months if he has to go to a function with his wife.  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  He asks me if he can have a refill today.  09/10/14 update:  The patient is seen today unexpectedly.  Last visit, I started [name] 145 mg, 2 tablets 3 times per day.  He is also on [name] 4 mg twice a day.  He called me initially to tell me that the [name] was working great and he felt much better on the medication.  In fact, he states that his PT and OT reported that he looked much better and his eval scores were markedly improved and they asked him what he had done differently.  He states last week was just a great week.  On Saturday, he was just a little lightheaded and his wife checked his orthostatics and he states that they were negative.  This week, however, he began to report some abnormal movements.  The movements were primarily of the left shoulder and in the abdomen. I wanted to see him on this medication before added or changed anything, as I have never seen him with dyskinesia.  Unfortunately, this morning he only took one of his tablets, but he did take 2 tablets at lunch time about 1 hour before coming.  He has not noticed the movements today.  He has noticed much less tremor overall since starting [name].  11/25/14 update:  The patient returns today for follow-up.  He is on [name] 4 mg twice a day and [name] 145 mg, 2 tablets 3 times per day.  His biggest issue is tremor, but states that its under good control today (but he just exercised).  He has developed tendinitis in the elbow.  He has clonazepam for REM behavior  disorder but admits he doesn't use it.  The patient remains very active in our Parkinson's exercise classes.  No falls.  Some days he does have to "fight" the posture and the "shuffling."  He didn't ever get to see Dr. Venetia Maxon as he states that his life got hectic moving his mother in law.    02/20/15 update:  The patient returns today, accompanied by his  wife who supplements the history.  Last visit, I increased his [name] to 195 mg, and he is taking 2 tablets 3 times a day.  He is also on [name] 4 mg twice a day.  He reports that he has not been doing as well because stress levels have been increased.  His mother-in-law, who is living with them, died unexpectedly.  Because of that, he has had more trouble sleeping as well.  He is still faithfully attending exercise classes and is involved in the Parkinson's community.  He has noticed more rigidity and tremor.  He is taking melatonin, 5 Mill grams at night to help him sleep but does not want to use clonazepam, even though he has at home.  05/24/25 update:  The patient returns today for follow-up.  His wife accompanies him and supplements the history.  He is on [name] 4 mg twice a day and [name] 195 mg, 2 tablets 3 times per day.  He has not wanted to increase the dose, although he likely could use more levodopa. States that he is more tremulous right now and attributes that to the fact that he has had a GI illness recently, which made sx's worse.  He has had emesis (one episode).  No diarrhea.  No fevers.    He has not had any falls.  He denies any hallucinations.  No confusion.  No lightheadedness or near syncope.  He continues to exercise faithfully.  06/12/15 update:  The patient is following up today, accompanied by his wife who supplements the history.  When I saw him last on 05/22/15, he was obviously underdosed and I expressed the need for more levodopa but the pt didn't want to increase it as he felt that his viral gastroenteritis was just causing his  worsening sx's.  He didn't tell me then that he had been off of his meds for 3 days and that is the reason he didn't look well.  They called me on 06/10/2015 however to state that he was doing much worse.  They wanted me to work him in that day, but I was not able to.  I didn't realize that he had been off all his PD meds since June 1 and had started receiving care from [place] natureopath in [place], Dr. Beckie BusingUrban.  He was given information on Dr. Beckie BusingUrban by his dentist Dr. Mellody DanceKeith, who told him he should try amino acid therapy.  He researched and started seeing Dr. Beckie BusingUrban and was told he needed to be off all his PD meds.  He started on D5-mucuna which markets itself as a "safe, natural" levodopa therapy.  He was told that he needed 4-6 months to see a response, but admits that he was told to tell his doctor what he was doing.  He was increasing the dose of this supplement based on urine dopamine levels which they were told were low.  They apparently were also measuring urine serotonin levels.  Not surprisingly, the patient decompensated.  He states that initially he didn't "feel that bad" but the "tremor was awful."  He also states that he had terrible vivid dreams and ultimately became exhausted from not sleeping, shaking and increased rigidity. It got so bad that he ended up in the ER 2 days ago.    He is back on rytary 195, 2 po tid.  He is a little quesy since being back on it.  He has only been on it for 2 days.  He states that  he talked with the natureopath and she told him to just "start back on it."  He is off of the D5-mucuna.  He and his wife bring in several articles for me to read today.    09/29/15 update:  The patient is following up today, accompanied by his wife who supplements the history.  He is on [name] 4 mg twice a day.  He reports that he is now taking  [name] 195 mg, 2 tablets 3 times a day along with [name] 145 mg, one tablet 3 times a day.  He spreads it out so that he takes it at 8 AM/3 PM/11 PM.   He does not necessarily halfway he describes as "traditional wearing off" but states that at the end of the dose he just does not feel good.  Overall, the patient states that he has been doing okay but he also feels that things are more unpredictable.  He feels more tired and thinks that he has mild dyskinesia.  When he eats, he is biting his lip in the same place.   He denies any lightheadedness or near syncope.  He denies falls.  When asked about depression, he states that he thinks "depression is a strong word" but admits that he gets frustrated and overwhelmed about the disease but uses exercise as a release.  He has sent me paperwork since last visit for his disability which has been completed.  He asks me about writing another letter regarding his VA disability as well.  12/30/15 update:  The patient is following up today.  Last visit, I increased his [name] to 6 mg twice a day.  His [name] has been slightly increased, so that he is on 195 mg, 3 tablets 3 times per day and he added an additional 1 tablet at night because he was awakening at 4 AM with tremor. He has changed that again, but taking the same overall dose of rytary, but is now taking 195, 3 in the AM, 2 in the afternoon, 3 in the evening and then takes 2 at bedtime.   Overall, he thinks he feels that it is a little inconsistent; sometimes he does well and sometimes he doesn't.  He continues to faithfully exercise.  No falls.  No hallucinations.  No syncope but has had some lightheadedness; he has had a uri and isn't sure if it is related to that/meds for that.  Mood has been good.  Sleep has been better since he has been back on his medications.  Appetite has been good.  No new medical issues.  He is still going to integrative therapies and thinks that it is helpful.  However, he did some therapy called alpha stim and was told that it changed the alpha waves in the brain and he didn't think that it was helpful and thinks that it made him feel  strange.  Feels that he has minor dyskinesia, especially of the arm when he walks or bends over.  Asks about DBS therapy and thinks he is going to consider that more this year.    03/03/16 update:  The patient is following up today.  He is on [name], 6 mg twice a day and [name] 195 mg on the following schedule: 3/3/3/2.  This is a little higher than last visit (prior 3/2/3/2).  He denies any falls since last visit.  He denies lightheadedness or near syncope.  No hallucinations.  Tremor has been pretty good, but it will fluctuate throughout the  day (states that he did 2 workouts today).  He does occasionally feel like he has some dyskinesia.  He saw Dr. [name] on February 6 and March 13 and I reviewed those records.  OMT was performed.  The patient feels that that was helpful for his musculoskeletal complaints.  He is doing lots of exercises.  Mood is stable.  Asks about meeting with Dr. Venetia Maxon for DBS but wife not ready to pursue.    06/24/16 update:  The patient is following up today, accompanied by his wife who supplements the history.    He is here for her levodopa challenge test.  He saw Dr. Venetia Maxon in May and is interested in DBS therapy.  Last visit, I increased his [name] so that he is taking 4 mg, 2 tablets twice a day.  He remains on [name] 195 mg on the following schedule: 3/3/3/2.  He takes clonazepam as needed for REM behavior disorder.  He has not had falls since last visit.  He has seen Dr. [name] several times and I reviewed those records.  He has not had any lightheadedness or near syncope.  He continues to exercise faithfully.  10/18/16 update:  Pt f/u today.  Wife doesn't accompany today.  He saw Dr. Alinda Dooms since our last visit and no evidence of cognitive deficits.  Pt on [name] 4 mg - 2 po bid and rytary 195 mg: and he has changed that from 3/3/3/2 to 4/3/3/3.  He has a little more off time with more tremor and slowness.  In late summer, he was putting a putting green into his  back yard and states that he tore an intercostal muscle so he couldn't exercise for 8-9 weeks because of pain with breathing.  Started back to working out 3-4 weeks ago.   01/14/16 update:  Patient follows up today, as he has some questions regarding DBS therapy.  Since our last visit, the [facility] device for deep brain stimulation has been FDA approved.  The patient and I discussed the device over the telephone, and he subsequently read about the device and thought that he would prefer to have this particular device and presents today to talk about it in more detail.  He remains on [name], 4 mg, and takes 2 tablets twice per day.  He also takes [name] 195 mg: 4/3/3/3.  Last visit, I gave him some carbidopa/levodopa 25/100 to use as needed and he states that he tried that but it didn't work well for him and so he isn't using that much.  01/31/17 update:  Patient follows up today for preop DBS recording.  Wife present and supplements history.  He had fiducials placed today.  He was to hold his meds this AM but didn't and feels like he is "on."    03/13/17 update:  Patient follows up today.  Patient is accompanied by his twin sister and wife who supplements the history.  The patient underwent bilateral STN DBS with [facility] [employer] device on 02/10/2017 and had IPG placement on 02/17/2017.  The patient has recovered nicely postoperatively.  He had some increase dyskinesia and emailed me about that.  I did tell him he could slightly decrease his [name] if he would like.  He states that he has been taking [name] 195, 3 po tid and [name], 4 mg, 2 tablets twice per day.  He held his Requip today and last took [name], 3 of them at 8 am.  He has had  no falls.  No lightheadedness or near syncope.  03/20/17 update:  Patient is seen today back in follow-up.  He is accompanied by his wife who supplements the history.  His device was activated one week ago, but we did see him today after activation as he was  having dyskinesia on the right side of the body.  The patient relates that the dyskinesia seem to be related to taking [name] 195 mg, 2 tablets and 1 [name] 4 mg.  He ended up not taking the medication and the dyskinesia when away.  However, when he was seen back to adjust the programming, he was more rigid, but was reluctant for me to change anything because he no longer had the dyskinesia.  We went up slightly on the left hemibody (right brain) and gave him some control over the left brain to go up and down.  He is still having some tremor in his feet.  He states that he is only taking med prn.  He took 1 Requip on Friday, none on Saturday, 1 requip and 1 rytary yesterday.  He feels that he did well yesterday with tremor and slight dyskinesia.  Wife states that she notices that patient has been "melancholy."  Part of this is because of the frustration with balance of tremor and dyskinesia and part of it is because he has not been back to exercising lately.  04/14/17 update:  Patient is seen back today in follow-up.  He has adjusted his DBS some on his own.  He states that he has had some difficulty in balancing symptoms between the left and right side of the body.  He has also been having some challenges with mood.  He was taking carbidopa/levodopa 25/100, 2-3 times per day, but this past week he ended up switching to [name] 195 mg and has been taking that 3 times per day.  He does think that that has helped mood somewhat.  He is back to exercising.  He has not fallen.  06/23/17 update:  Patient is seen today in follow-up.  He is currently on [name], 145 mg and takes 2 to 3 [name] at 8:00am, 2 [name] at 1:00pm, 2 [name] At 6:00pm and 1 [name] at bedtime.  He has been off and on the Requip.  He initially thought it made him fatigued, but went back to taking the 2 mg tablets and is on the XL.  Thinks that the XL gives him some dyskinesia.   Nonmotor symptoms such as fatigue and apathy have been the biggest  issues.  He really has not wanted to take any antidepressants.  States that he is not depressed and taking supplement of amino acid triptophan and he feels apathy is better.  He has struggled with voice strength.  He has not had falls.  He is back to exercise.  For the first time in a long time, he was able to walk the entire golf course and play a round of golf without a feeling of wearing off of meds.  10/17/17 update: Patient seen today in follow-up for Parkinson's disease, status post DBS surgery.  He is on rytary, 145 mg and takes 2-3 [name] at 8 AM, 2 [name] at 1 PM, 2 [name] at 6 PM and 1 [name] at bedtime.  He is on ropinirole XL, 2 mg daily.  No falls.  No lightheadedness or near syncope.  No hallucinations.  No compulsive behaviors.  No sleep attacks.  He is doing spin class 2 days  per week, lifting weights 4 days per week, [employer] classs.  He is busy with church.  Not much dyskinesia.  Not much tremor.  He turned L side down a little.  He is going to [place] with [employer] to tell his story.  He is fatigued but doesn't want medication for that  02/05/18 update: Patient is seen today in follow-up for Parkinson's disease.  Patient emailed me this past Friday to let me know that he has been "experimenting" with how he takes his medication, mixing both [name] with immediate release levodopa.  Today, he tells me that he is currently taking rytary, 145 mg, 2-3 tablets 3 times per day and [name] prn (generally one time per day).  He reports that the tremor in the left foot is fairly unpredictable and when bad, he uses it as a sign that it is time for medication.  In his email, he also felt that the Requip was not helping, so I told him to go ahead and discontinue that over the weekend and we would address it today.   He ended up stopping it last night as he had stress over the week. He has attended speech therapy.  He also attended the new thrive and connect group for active patients with  Parkinson's disease.  In fact, he will be leading this group.  06/04/18 update: Patient is seen today in follow-up for Parkinson's disease.  He emailed me Friday to give me an update.  Last visit, I told him to go ahead and discontinue the Requip.  He ended up doing that about a week ago.  He has been taking 3 [name], 145 mg, at 8:30 AM, 2 [name] at 1:30 PM.  He takes another 2 [name] at bedtime.  He gets quite fatigued in the mid afternoon, despite starting Provigil in the morning. He states that he actually stopped it because he wasn't sure it was that helpful.     He is noting tremor of the left greater than right foot.  He feels that he is always "behind" with medication but if he takes more than what he is currently, he will have some dyskinesia.  08/27/18 update: Patient seen today in follow-up for Parkinson's disease.  Patient is on [name] 145 mg, and is now usually taking 2 po qid.  Does give him dyskinesia. Samples given last visit of Inbrija and the patient states that he hasn't tried it yet. Provigil was restarted last visit because of daytime hypersomnolence.  He was taking twice a day and thought that it made him feel weird, as if it wears off and he drops off at end of day.  Wife thinks that bigger issue is sleep issues at night and being tired because of that.  Wife notes nightmares.  She notes that 3-4 nights per week and has to awaken him b/c of nightmare.   Records have been reviewed since our last visit.  He saw Dr. Katrinka Blazing on August 7 for back pain and he did osteopathic manipulative therapy.  02/12/19 update: Patient seen today in follow-up for Parkinson's disease.  Last visit, when I saw him he was taking [name] 145 mg, 2 tablets 4 times per day.  He tells me that he has decreased to about 5 total [name] per day.  He did take an extra tablet before he came today and is noting some dyskinesia.  If he has dyskinesia it is generally the right foot and the left hand.  He also  notes that his speech  can be affected intermittently.  His biggest complaint continues to be fatigue.  He has tried Provigil and Nuvigil without success.  He does think his speech is getting softer.  He has an appointment with speech therapy upcoming.  I did give him clonazepam last visit for REM behavior disorder.  Reports that this is definitely helped and requests a refill.  PREVIOUS MEDICATIONS: Requip and neupro (tried few days only); propranolol helped tremor some; inbrija - samples given but not taken  ALLERGIES:   No Known Allergies  CURRENT MEDICATIONS:   Allergies as of 02/12/2019   No Known Allergies     Medication List       Accurate as of February 12, 2019  3:58 PM. Always use your most recent med list.        b complex vitamins tablet Take 1 tablet by mouth 2 (two) times daily.   Carbidopa-Levodopa ER 36.25-145 MG Cpcr Commonly known as:  [name] Take 2 capsules by mouth 3 (three) times daily.   clonazePAM 0.5 MG tablet Commonly known as:  KLONOPIN Take 0.5 tablets (0.25 mg total) by mouth at bedtime.   ibuprofen 200 MG tablet Commonly known as:  ADVIL,MOTRIN Take 400 mg by mouth every 6 (six) hours as needed for headache or moderate pain.   LECITHIN PO Take 1 capsule by mouth 2 (two) times daily.   Levodopa 42 MG Caps Commonly known as:  INBRIJA Place 2 capsules into inhaler and inhale 5 (five) times daily as needed.   Melatonin 3 MG Caps Take 3 mg by mouth at bedtime.   selegiline 5 MG tablet Commonly known as:  ELDEPRYL Take 1 tablet (5 mg total) by mouth 2 (two) times daily with a meal.   VITAMIN C PO Take 3 tablets by mouth 3 (three) times daily.        PAST MEDICAL HISTORY:   Past Medical History:  Diagnosis Date  . Anxiety   . History of kidney stones    passed  . PD (Parkinson's disease) (HCC) 09/10/2013  . PONV (postoperative nausea and vomiting)    nausea     PAST SURGICAL HISTORY:   Past Surgical History:  Procedure Laterality Date  . COLONOSCOPY     . MINOR PLACEMENT OF FIDUCIAL N/A 01/31/2017   Procedure: MINOR PLACEMENT OF FIDUCIAL;  Surgeon: [name], MD;  [facility];  Service: Neurosurgery;  Laterality: N/A;  Fiducial placement  . none    . PULSE GENERATOR IMPLANT Right 02/17/2017   Procedure: Right implantable pulse generator placement;  Surgeon: [name], MD;  Location: [place] [facility];  Service: Neurosurgery;  Laterality: Right;  Bilateral implantable pulse generator placement  . SUBTHALAMIC STIMULATOR BATTERY REPLACEMENT N/A 10/19/2018   Procedure: Deep Brain stimulator implantable pulse generator revision;  Surgeon: [name], [name], MD;  [facility];  Service: Neurosurgery;  Laterality: N/A;  Deep Brain stimulator implantable pulse generator revision  . SUBTHALAMIC STIMULATOR INSERTION Bilateral 02/10/2017   Procedure: Bilateral Deep brain stimulator placement;  Surgeon: [name], MD;  [facility];  Service: Neurosurgery;  Laterality: Bilateral;  Bilateral Deep brain stimulator placement  . UPPER GASTROINTESTINAL ENDOSCOPY      SOCIAL HISTORY:   Social History   Socioeconomic History  . Marital status: Married    Spouse name: Not on file  . Number of children: Not on file  . Years of education: Not on file  . Highest education level: Not on file  Occupational History  .  Not on file  Social Needs  . Financial resource strain: Not on file  . Food insecurity:    Worry: Not on file    Inability: Not on file  . Transportation needs:    Medical: Not on file    Non-medical: Not on file  Tobacco Use  . Smoking status: Never Smoker  . Smokeless tobacco: Never Used  Substance and Sexual Activity  . Alcohol use: Yes    Comment: Occ  . Drug use: No  . Sexual activity: Not on file  Lifestyle  . Physical activity:    Days per week: Not on file    Minutes per session: Not on file  . Stress: Not on file  Relationships  . Social connections:    Talks on phone: Not on file    Gets together: Not on file     Attends religious service: Not on file    Active member of club or organization: Not on file    Attends meetings of clubs or organizations: Not on file    Relationship status: Not on file  . Intimate partner violence:    Fear of current or ex partner: Not on file    Emotionally abused: Not on file    Physically abused: Not on file    Forced sexual activity: Not on file  Other Topics Concern  . Not on file  Social History Narrative  . Not on file    FAMILY HISTORY:   Family Status  Relation Name Status  . Mother  Deceased at age 14       Breast Cancer  . Father  Alive       Diabetes  . Sister  Alive       healthy  . Daughter  Alive       2, twins  . Son  Alive  . Other  (Not Specified)    ROS: Review of Systems  Constitutional: Positive for malaise/fatigue.  HENT: Negative.   Eyes: Negative.   Respiratory: Negative.   Cardiovascular: Negative.   Gastrointestinal: Negative.   Skin: Negative.   Psychiatric/Behavioral: Negative.      PHYSICAL EXAMINATION:    VITALS:   Vitals:   02/12/19 1459  BP: 112/60  Pulse: 90  SpO2: 96%  Weight: 170 lb (77.1 kg)  Height: 6\' 2"  (1.88 m)   Wt Readings from Last 3 Encounters:  02/12/19 170 lb (77.1 kg)  10/19/18 170 lb (77.1 kg)  10/15/18 168 lb 3.2 oz (76.3 kg)    GEN:  The patient appears stated age and is in NAD. HEENT:  Normocephalic, atraumatic.  The mucous membranes are moist. The superficial temporal arteries are without ropiness or tenderness. CV:  RRR Lungs:  CTAB Neck/HEME:  There are no carotid bruits bilaterally.  Neurological examination:  Orientation: The patient is alert and oriented x3. Cranial nerves: There is good facial symmetry. The speech is fluent and clear. Soft palate rises symmetrically and there is no tongue deviation. Hearing is intact to conversational tone. Sensation: Sensation is intact to light touch throughout Motor: Strength is 5/5 in the bilateral upper and lower extremities.    Shoulder shrug is equal and symmetric.  There is no pronator drift.  Movement examination: Tone: There is normal tone in the upper and lower extremities. Abnormal movements: Initially none, but about halfway through the visit he began to developed dyskinesia in the left shoulder as he had just taken his [name] Coordination:  There is no decremation with RAM's,  with any form of RAMS, including alternating supination and pronation of the forearm, hand opening and closing, finger taps, heel taps and toe taps. Gait and Station: The patient has no difficulty arising out of a deep-seated chair without the use of the hands. The patient's stride length is good with good armswing.     ASSESSMENT/PLAN:  1. idiopathic Parkinson's disease.  The patient has tremor, bradykinesia, rigidity and mild postural instability.  -The patient underwent bilateral STN DBS with [facility] [employer] device on 02/10/2017 and had IPG placement on 02/17/2017.  [employer] device has gotten FDA approval for MRI compatibility but his battery would need to be changed out.  He would like to proceed with that.  Will send referral to Dr. Venetia Maxon  -He will continue rytary 145 mg, 1 tablet 5 times per day.  I do think that his intermittent speech issues are related to extra [name] as opposed to stimulation, given that speech issues are intermittent.  -Patient has occasional dyskinesia, likely related to medication.  He asked about turning down stimulation on his own.  I told him I probably would not recommend that, but would rather recommend him altering [name] dose if he has too much dyskinesia.  I did adjust his device somewhat today which will hopefully help with the dyskinesia. 2.  Fatigue   -didn't think that provigil help and had SE.  nuvigil likewise did not seem to be that helpful.  We did decide to add selegiline, 5 mg twice per day and see if that would help.  I am not sure it well, but it is certainly worth a try. 3.  REM behavior  disorder.  -Better with clonazepam 0.5 mg, half a tablet at night.  I refilled that today. 4.  I will see the patient back in follow-up in the next 6 months, sooner should new neurologic issues arise.  Total visit time was 25 minutes, not including DBS time.

## 2019-02-12 ENCOUNTER — Encounter: Payer: Self-pay | Admitting: Neurology

## 2019-02-12 ENCOUNTER — Ambulatory Visit (INDEPENDENT_AMBULATORY_CARE_PROVIDER_SITE_OTHER): Payer: Medicare Other | Admitting: Neurology

## 2019-02-12 VITALS — BP 112/60 | HR 90 | Ht 74.0 in | Wt 170.0 lb

## 2019-02-12 DIAGNOSIS — G4752 REM sleep behavior disorder: Secondary | ICD-10-CM | POA: Diagnosis not present

## 2019-02-12 DIAGNOSIS — G20 Parkinson's disease: Secondary | ICD-10-CM | POA: Diagnosis not present

## 2019-02-12 DIAGNOSIS — G249 Dystonia, unspecified: Secondary | ICD-10-CM | POA: Diagnosis not present

## 2019-02-12 DIAGNOSIS — R5383 Other fatigue: Secondary | ICD-10-CM | POA: Diagnosis not present

## 2019-02-12 MED ORDER — CLONAZEPAM 0.5 MG PO TABS: 0.2500 mg | ORAL_TABLET | Freq: Every day | ORAL | 1 refills | Status: DC

## 2019-02-12 MED ORDER — SELEGILINE HCL 5 MG PO TABS: 5.0000 mg | ORAL_TABLET | Freq: Two times a day (BID) | ORAL | 3 refills | Status: DC

## 2019-02-12 NOTE — Patient Instructions (Signed)
Start selegeline - 5 mg - 1 tablet in the AM and 1 at lunch.

## 2019-02-12 NOTE — Procedures (Signed)
DBS Programming was performed.    Manufacturer of DBS device: [employer]  Total time spent programming was 25 minutes.  Device was confirmed to be on.  Soft start was confirmed to be on.  Impedences were checked and were within normal limits.  Battery was checked and was determined to be functioning normally and not near the end of life.  Final settings were as follows:  Left brain electrode:     3-(90%)4-(10%)C+           ; Amplitude  3.4   mA   ; Pulse width 60 microseconds;   Frequency   130   Hz.  Right brain electrode:     10-(50%)11-(50%)C+          ; Amplitude   2.4  mA ;  Pulse width 60  microseconds;  Frequency   130    Hz.

## 2019-06-28 NOTE — Progress Notes (Signed)
Ian [name] was seen today in the movement disorders clinic for neurologic consultation at the request of his PCP, Dr. Drue Second.  He has previously seen  Patient, [name] and saw [name] in [place].   This patient is accompanied in the office by his spouse who supplements the history.The consultation is for the evaluation of PD.  I did review Dr. [name] notes.  Dr. Hurley Cisco notes are not available to me.    Hist first sx was tremor was R arm tremor.  It was in 2011 according to the patient today, but Dr. [name] notes indicate that perhaps this was in 2009.  He was dx with enhanced physiologic tremor.  He was started on propranolol with some help, but made him sluggish.  He went to see [name] in 2013 for an opinion in [place].  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  He was placed on baclofen and requip.  He thinks that the requip has helped; he is able to turn over in the bed better and move better.  The pt first saw Dr. [name] on 09/10/13.  Dr. [name] agreed with the Requip and start the patient on [name], but the patient is currently only on its once per day.  12/04/13 update:  The pt presents to the clinic today for his PD.  He is exercising faithfully.  He attends the PD exercise class through the rehab center. The pt was changed to the IR formulation of carbidopa/levodopa 25/100 last visit and is on it tid.  He is not sure it helps.  Afternoons are better than mornings.  carbidopa/levodopa 25/100 is taken at 8am/3:30 pm and bedtime. He is also on requip xl 6 mg daily.  Continues to have significant tremor.  01/27/14 update:  The patient presents today with his wife, who supplements the history.  The pt has a hx of PD and last visit, we decided to increase his carbidopa/levodopa to 2 po tid.  the patient states that he just felt lightheaded on this medication  dosage and up going back to [name] 3 times per day and then takes [name], half tablet twice per day. He remains on requip xl 6 mg daily.  Overall, he does feel fairly good.  He still has tremor.  He went to see Dr. [name] since our last visit and again talked about DBS therapy.  Dr. [name] mentioned to him that he looks much better and he had previously and had [name] score had declined.  Some yelling out in the sleep but this has not been a huge issue.  No sleepwalking/talking.  No falls.  He is exercising faithfully.  05/28/14 update:  The patient is presenting today for followup.  Last visit, I increased his [name] from 6 mg daily to 4 mg twice a day.  He is doing well with this.  Tremor is about the same.  He is on a strange combination of carbidopa levodopa, but when I tried to switch him off the IR version, the patient just felt lightheaded and ended up going back to a combination of IR and CR.  He is currently on 25/100 CR 3 times per day and then takes [name], half tablet twice per day and sometimes three times per day.  He does best in the evening and worst in the AM.   No swallowing troubles.  Has dropped weight but thinks that it is exercise and was trying to follow gluten free diet. He is walking, doing a spinning class for PD, and has started golfing again, which he hasn't done in years.   Still considering DBS therapy.    08/27/14 update:  Patient presents today for followup.  He is currently on [name], 4mg  twice a day.  He is on a strange combination of carbidopa levodopa, but hasn't been able to tolerate the IR version well.[name]  He is currently on 25/100 CR 2-3 times per day and then 50/200 at night.  He rarely takes the IR now; only if playing golf or if he needs a boost during the day.  He is finding that he is having more tremor.  He is also having dreams at night that are making his sleep feel unrestful.  He is strongly  considering deep brain stimulation.  He asks me about details regarding this today.  He has asks me about potentially getting a refill on his Ativan.  He apparently got this filled from an outside physician about a year and a half ago and only had 10 pills in total and is just now running out.  He takes one pill every 3 or 4 months if he has to go to a function with his wife.  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  He asks me if he can have a refill today.  09/10/14 update:  The patient is seen today unexpectedly.  Last visit, I started [name] 145 mg, 2 tablets 3 times per day.  He is also on [name] 4 mg twice a day.  He called me initially to tell me that the [name] was working great and he felt much better on the medication.  In fact, he states that his PT and OT reported that he looked much better and his eval scores were markedly improved and they asked him what he had done differently.  He states last week was just a great week.  On Saturday, he was just a little lightheaded and his wife checked his orthostatics and he states that they were negative.  This week, however, he began to report some abnormal movements.  The movements were primarily of the left shoulder and in the abdomen. I wanted to see him on this medication before added or changed anything, as I have never seen him with dyskinesia.  Unfortunately, this morning he only took one of his tablets, but he did take 2 tablets at lunch time about 1 hour before coming.  He has not noticed the movements today.  He has noticed much less tremor overall since starting [name].  11/25/14 update:  The patient returns today for follow-up.  He is on [name] 4 mg twice a day and [name] 145 mg, 2 tablets 3 times per day.  His biggest issue is tremor, but states that its under good control today (but he just exercised).  He has developed tendinitis in the elbow.  He has clonazepam for REM behavior  disorder but admits he doesn't use it.  The patient remains very active in our Parkinson's exercise classes.  No falls.  Some days he does have to "fight" the posture and the "shuffling."  He didn't ever get to see Dr. Venetia Maxon as he states that his life got hectic moving his mother in law.    02/20/15 update:  The patient returns today, accompanied by his  wife who supplements the history.  Last visit, I increased his [name] to 195 mg, and he is taking 2 tablets 3 times a day.  He is also on [name] 4 mg twice a day.  He reports that he has not been doing as well because stress levels have been increased.  His mother-in-law, who is living with them, died unexpectedly.  Because of that, he has had more trouble sleeping as well.  He is still faithfully attending exercise classes and is involved in the Parkinson's community.  He has noticed more rigidity and tremor.  He is taking melatonin, 5 Mill grams at night to help him sleep but does not want to use clonazepam, even though he has at home.  05/24/25 update:  The patient returns today for follow-up.  His wife accompanies him and supplements the history.  He is on [name] 4 mg twice a day and [name] 195 mg, 2 tablets 3 times per day.  He has not wanted to increase the dose, although he likely could use more levodopa. States that he is more tremulous right now and attributes that to the fact that he has had a GI illness recently, which made sx's worse.  He has had emesis (one episode).  No diarrhea.  No fevers.    He has not had any falls.  He denies any hallucinations.  No confusion.  No lightheadedness or near syncope.  He continues to exercise faithfully.  06/12/15 update:  The patient is following up today, accompanied by his wife who supplements the history.  When I saw him last on 05/22/15, he was obviously underdosed and I expressed the need for more levodopa but the pt didnt want to increase it as he felt that his viral gastroenteritis was just causing his  worsening sxs.  He didn't tell me then that he had been off of his meds for 3 days and that is the reason he didn't look well.  They called me on 06/10/2015 however to state that he was doing much worse.  They wanted me to work him in that day, but I was not able to.  I didn't realize that he had been off all his PD meds since June 1 and had started receiving care from a natureopath in [place], Dr. Beckie BusingUrban.  He was given information on Dr. Beckie BusingUrban by his dentist Dr. Mellody DanceKeith, who told him he should try amino acid therapy.  He researched and started seeing Dr. Beckie BusingUrban and was told he needed to be off all his PD meds.  He started on D5-mucuna which markets itself as a safe, natural levodopa therapy.  He was told that he needed 4-6 months to see a response, but admits that he was told to tell his doctor what he was doing.  He was increasing the dose of this supplement based on urine dopamine levels which they were told were low.  They apparently were also measuring urine serotonin levels.  Not surprisingly, the patient decompensated.  He states that initially he didn't "feel that bad" but the "tremor was awful."  He also states that he had terrible vivid dreams and ultimately became exhausted from not sleeping, shaking and increased rigidity. It got so bad that he ended up in the ER 2 days ago.    He is back on rytary 195, 2 po tid.  He is a little quesy since being back on it.  He has only been on it for 2 days.  He states that  he talked with the natureopath and she told him to just "start back on it."  He is off of the D5-mucuna.  He and his wife bring in several articles for me to read today.    09/29/15 update:  The patient is following up today, accompanied by his wife who supplements the history.  He is on [name] 4 mg twice a day.  He reports that he is now taking  [name] 195 mg, 2 tablets 3 times a day along with [name] 145 mg, one tablet 3 times a day.  He spreads it out so that he takes it at 8 AM/3 PM/11 PM.   He does not necessarily halfway he describes as "traditional wearing off" but states that at the end of the dose he just does not feel good.  Overall, the patient states that he has been doing okay but he also feels that things are more unpredictable.  He feels more tired and thinks that he has mild dyskinesia.  When he eats, he is biting his lip in the same place.   He denies any lightheadedness or near syncope.  He denies falls.  When asked about depression, he states that he thinks "depression is a strong word" but admits that he gets frustrated and overwhelmed about the disease but uses exercise as a release.  He has sent me paperwork since last visit for his disability which has been completed.  He asks me about writing another letter regarding his VA disability as well.  12/30/15 update:  The patient is following up today.  Last visit, I increased his [name] to 6 mg twice a day.  His [name] has been slightly increased, so that he is on 195 mg, 3 tablets 3 times per day and he added an additional 1 tablet at night because he was awakening at 4 AM with tremor. He has changed that again, but taking the same overall dose of rytary, but is now taking 195, 3 in the AM, 2 in the afternoon, 3 in the evening and then takes 2 at bedtime.   Overall, he thinks he feels that it is a little inconsistent; sometimes he does well and sometimes he doesn't.  He continues to faithfully exercise.  No falls.  No hallucinations.  No syncope but has had some lightheadedness; he has had a uri and isn't sure if it is related to that/meds for that.  Mood has been good.  Sleep has been better since he has been back on his medications.  Appetite has been good.  No new medical issues.  He is still going to integrative therapies and thinks that it is helpful.  However, he did some therapy called alpha stim and was told that it changed the alpha waves in the brain and he didn't think that it was helpful and thinks that it made him feel  strange.  Feels that he has minor dyskinesia, especially of the arm when he walks or bends over.  Asks about DBS therapy and thinks he is going to consider that more this year.    03/03/16 update:  The patient is following up today.  He is on [name], 6 mg twice a day and [name] 195 mg on the following schedule: 3/3/3/2.  This is a little higher than last visit (prior 3/2/3/2).  He denies any falls since last visit.  He denies lightheadedness or near syncope.  No hallucinations.  Tremor has been pretty good, but it will fluctuate throughout the  day (states that he did 2 workouts today).  He does occasionally feel like he has some dyskinesia.  He saw Dr. Terrilee FilesZach Smith on February 6 and March 13 and I reviewed those records.  OMT was performed.  The patient feels that that was helpful for his musculoskeletal complaints.  He is doing lots of exercises.  Mood is stable.  Asks about meeting with Dr. Venetia MaxonStern for DBS but wife not ready to pursue.    06/24/16 update:  The patient is following up today, accompanied by his wife who supplements the history.    He is here for her levodopa challenge test.  He saw Dr. Venetia MaxonStern in May and is interested in DBS therapy.  Last visit, I increased his [name] so that he is taking 4 mg, 2 tablets twice a day.  He remains on [name] 195 mg on the following schedule: 3/3/3/2.  He takes clonazepam as needed for REM behavior disorder.  He has not had falls since last visit.  He has seen Dr. Terrilee FilesZach Smith several times and I reviewed those records.  He has not had any lightheadedness or near syncope.  He continues to exercise faithfully.  10/18/16 update:  Pt f/u today.  Wife doesn't accompany today.  He saw Dr. Alinda DoomsBailar since our last visit and no evidence of cognitive deficits.  Pt on [name] 4 mg - 2 po bid and rytary 195 mg: and he has changed that from 3/3/3/2 to 4/3/3/3.  He has a little more off time with more tremor and slowness.  In late summer, he was putting a putting green into his  back yard and states that he tore an intercostal muscle so he couldn't exercise for 8-9 weeks because of pain with breathing.  Started back to working out 3-4 weeks ago.   01/14/16 update:  Patient follows up today, as he has some questions regarding DBS therapy.  Since our last visit, the [facility] device for deep brain stimulation has been FDA approved.  The patient and I discussed the device over the telephone, and he subsequently read about the device and thought that he would prefer to have this particular device and presents today to talk about it in more detail.  He remains on [name], 4 mg, and takes 2 tablets twice per day.  He also takes [name] 195 mg: 4/3/3/3.  Last visit, I gave him some carbidopa/levodopa 25/100 to use as needed and he states that he tried that but it didn't work well for him and so he isn't using that much.  01/31/17 update:  Patient follows up today for preop DBS recording.  Wife present and supplements history.  He had fiducials placed today.  He was to hold his meds this AM but didn't and feels like he is "on."    03/13/17 update:  Patient follows up today.  Patient is accompanied by his twin sister and wife who supplements the history.  The patient underwent bilateral STN DBS with [facility] device on 02/10/2017 and had IPG placement on 02/17/2017.  The patient has recovered nicely postoperatively.  He had some increase dyskinesia and emailed me about that.  I did tell him he could slightly decrease his [name] if he would like.  He states that he has been taking [name] 195, 3 po tid and [name], 4 mg, 2 tablets twice per day.  He held his Requip today and last took [name], 3 of them at 8 am.  He has had  no falls.  No lightheadedness or near syncope.  03/20/17 update:  Patient is seen today back in follow-up.  He is accompanied by his wife who supplements the history.  His device was activated one week ago, but we did see him today after activation as he was  having dyskinesia on the right side of the body.  The patient relates that the dyskinesia seem to be related to taking [name] 195 mg, 2 tablets and 1 [name] 4 mg.  He ended up not taking the medication and the dyskinesia when away.  However, when he was seen back to adjust the programming, he was more rigid, but was reluctant for me to change anything because he no longer had the dyskinesia.  We went up slightly on the left hemibody (right brain) and gave him some control over the left brain to go up and down.  He is still having some tremor in his feet.  He states that he is only taking med prn.  He took 1 Requip on Friday, none on Saturday, 1 requip and 1 rytary yesterday.  He feels that he did well yesterday with tremor and slight dyskinesia.  Wife states that she notices that patient has been "melancholy."  Part of this is because of the frustration with balance of tremor and dyskinesia and part of it is because he has not been back to exercising lately.  04/14/17 update:  Patient is seen back today in follow-up.  He has adjusted his DBS some on his own.  He states that he has had some difficulty in balancing symptoms between the left and right side of the body.  He has also been having some challenges with mood.  He was taking carbidopa/levodopa 25/100, 2-3 times per day, but this past week he ended up switching to [name] 195 mg and has been taking that 3 times per day.  He does think that that has helped mood somewhat.  He is back to exercising.  He has not fallen.  06/23/17 update:  Patient is seen today in follow-up.  He is currently on [name], 145 mg and takes 2 to 3 [name] at 8:00am, 2 [name] at 1:00pm, 2 [name] At 6:00pm and 1 [name] at bedtime.  He has been off and on the Requip.  He initially thought it made him fatigued, but went back to taking the 2 mg tablets and is on the XL.  Thinks that the XL gives him some dyskinesia.   Nonmotor symptoms such as fatigue and apathy have been the biggest  issues.  He really has not wanted to take any antidepressants.  States that he is not depressed and taking supplement of amino acid triptophan and he feels apathy is better.  He has struggled with voice strength.  He has not had falls.  He is back to exercise.  For the first time in a long time, he was able to walk the entire golf course and play a round of golf without a feeling of wearing off of meds.  10/17/17 update: Patient seen today in follow-up for Parkinson's disease, status post DBS surgery.  He is on rytary, 145 mg and takes 2-3 [name] at 8 AM, 2 [name] at 1 PM, 2 [name] at 6 PM and 1 [name] at bedtime.  He is on ropinirole XL, 2 mg daily.  No falls.  No lightheadedness or near syncope.  No hallucinations.  No compulsive behaviors.  No sleep attacks.  He is doing spin class 2 days  per week, lifting weights 4 days per week, [employer] classs.  He is busy with church.  Not much dyskinesia.  Not much tremor.  He turned L side down a little.  He is going to [place] with [employer] to tell his story.  He is fatigued but doesn't want medication for that  02/05/18 update: Patient is seen today in follow-up for Parkinson's disease.  Patient emailed me this past Friday to let me know that he has been "experimenting" with how he takes his medication, mixing both [name] with immediate release levodopa.  Today, he tells me that he is currently taking rytary, 145 mg, 2-3 tablets 3 times per day and [name] prn (generally one time per day).  He reports that the tremor in the left foot is fairly unpredictable and when bad, he uses it as a sign that it is time for medication.  In his email, he also felt that the Requip was not helping, so I told him to go ahead and discontinue that over the weekend and we would address it today.   He ended up stopping it last night as he had stress over the week. He has attended speech therapy.  He also attended the new thrive and connect group for active patients with  Parkinson's disease.  In fact, he will be leading this group.  06/04/18 update: Patient is seen today in follow-up for Parkinson's disease.  He emailed me Friday to give me an update.  Last visit, I told him to go ahead and discontinue the Requip.  He ended up doing that about a week ago.  He has been taking 3 [name], 145 mg, at 8:30 AM, 2 [name] at 1:30 PM.  He takes another 2 [name] at bedtime.  He gets quite fatigued in the mid afternoon, despite starting Provigil in the morning. He states that he actually stopped it because he wasn't sure it was that helpful.     He is noting tremor of the left greater than right foot.  He feels that he is always "behind" with medication but if he takes more than what he is currently, he will have some dyskinesia.  08/27/18 update: Patient seen today in follow-up for Parkinson's disease.  Patient is on [name] 145 mg, and is now usually taking 2 po qid.  Does give him dyskinesia. Samples given last visit of Inbrija and the patient states that he hasn't tried it yet. Provigil was restarted last visit because of daytime hypersomnolence.  He was taking twice a day and thought that it made him feel weird, as if it wears off and he drops off at end of day.  Wife thinks that bigger issue is sleep issues at night and being tired because of that.  Wife notes nightmares.  She notes that 3-4 nights per week and has to awaken him b/c of nightmare.   Records have been reviewed since our last visit.  He saw Dr. Katrinka Blazing on August 7 for back pain and he did osteopathic manipulative therapy.  02/12/19 update: Patient seen today in follow-up for Parkinson's disease.  Last visit, when I saw him he was taking [name] 145 mg, 2 tablets 4 times per day.  He tells me that he has decreased to about 5 total [name] per day.  He did take an extra tablet before he came today and is noting some dyskinesia.  If he has dyskinesia it is generally the right foot and the left hand.  He also  notes that his speech  can be affected intermittently.  His biggest complaint continues to be fatigue.  He has tried Provigil and Nuvigil without success.  He does think his speech is getting softer.  He has an appointment with speech therapy upcoming.  I did give him clonazepam last visit for REM behavior disorder.  Reports that this is definitely helped and requests a refill.  07/01/19 update: Patient is seen today in follow-up for his Parkinson's disease, status post DBS surgery.  Last visit, we changed his setting on his DBS somewhat because of dyskinesia and told him that, if he would like, he can reduce his number of [name] as well.  He reports that he is still taking [name] 145 mg, 5 tablets per day (2/1/1/2).  We did add selegiline last visit to see if that would help with his fatigue and he states that it didn't really help.  He does remain on clonazepam for REM behavior disorder, half a tablet at night and that is working well.  He has had no falls since last visit.  No lightheadedness or near syncope.  He just started going back to [employer] this week.  PREVIOUS MEDICATIONS: Requip and neupro (tried few days only); propranolol helped tremor some; inbrija - samples given but not taken; provigil; nuvigil; selegeline (no help for fatigue)  ALLERGIES:   No Known Allergies  CURRENT MEDICATIONS:   Allergies as of 07/01/2019   No Known Allergies     Medication List       Accurate as of July 01, 2019  3:31 PM. If you have any questions, ask your nurse or doctor.        STOP taking these medications   selegiline 5 MG tablet Commonly known as: ELDEPRYL Stopped by: Lurena Joiner Ebony Yorio, DO     TAKE these medications   Armodafinil 150 MG tablet Take by mouth.   b complex vitamins tablet Take 1 tablet by mouth 2 (two) times daily.   Carbidopa-Levodopa ER 36.25-145 MG Cpcr Commonly known as: [name] Take 2 capsules by mouth 3 (three) times daily. What changed:   how much to take  how to take this  when to  take this  additional instructions   clonazePAM 0.5 MG tablet Commonly known as: KlonoPIN Take 0.5 tablets (0.25 mg total) by mouth at bedtime.   ibuprofen 200 MG tablet Commonly known as: ADVIL Take 400 mg by mouth every 6 (six) hours as needed for headache or moderate pain.   LECITHIN PO Take 1 capsule by mouth 2 (two) times daily.   Levodopa 42 MG Caps Commonly known as: [place] 2 capsules into inhaler and inhale 5 (five) times daily as needed.   Melatonin 3 MG Caps Take 3 mg by mouth at bedtime.   VITAMIN C PO Take 3 tablets by mouth 3 (three) times daily.        PAST MEDICAL HISTORY:   Past Medical History:  Diagnosis Date   Anxiety    History of kidney stones    passed   PD (Parkinson's disease) (HCC) 09/10/2013   PONV (postoperative nausea and vomiting)    nausea     PAST SURGICAL HISTORY:   Past Surgical History:  Procedure Laterality Date   COLONOSCOPY     MINOR PLACEMENT OF FIDUCIAL N/A 01/31/2017   Procedure: MINOR PLACEMENT OF FIDUCIAL;  Surgeon: Maeola Harman, MD;  Location: [facility] OR;  Service: Neurosurgery;  Laterality: N/A;  Fiducial placement   none  PULSE GENERATOR IMPLANT Right 02/17/2017   Procedure: Right implantable pulse generator placement;  Surgeon: Erline Levine, MD;  Location: [place];  Service: Neurosurgery;  Laterality: Right;  Bilateral implantable pulse generator placement   SUBTHALAMIC STIMULATOR BATTERY REPLACEMENT N/A 10/19/2018   Procedure: Deep Brain stimulator implantable pulse generator revision;  Surgeon: Erline Levine, MD;  Location: [place];  Service: Neurosurgery;  Laterality: N/A;  Deep Brain stimulator implantable pulse generator revision   SUBTHALAMIC STIMULATOR INSERTION Bilateral 02/10/2017   Procedure: Bilateral Deep brain stimulator placement;  Surgeon: Erline Levine, MD;  Location: [place];  Service: Neurosurgery;  Laterality: Bilateral;  Bilateral Deep brain stimulator placement   UPPER GASTROINTESTINAL  ENDOSCOPY      SOCIAL HISTORY:   Social History   Socioeconomic History   Marital status: Married    Spouse name: Not on file   Number of children: 3   Years of education: Not on file   Highest education level: Master's degree (e.g., MA, MS, MEng, MEd, MSW, MBA)  Occupational History   Not on file  Social Needs   Financial resource strain: Not on file   Food insecurity    Worry: Not on file    Inability: Not on file   Transportation needs    Medical: Not on file    Non-medical: Not on file  Tobacco Use   Smoking status: Never Smoker   Smokeless tobacco: Never Used  Substance and Sexual Activity   Alcohol use: Yes    Alcohol/week: 1.0 standard drinks    Types: 1 Cans of beer per week    Comment: Occ   Drug use: No   Sexual activity: Not on file  Lifestyle   Physical activity    Days per week: Not on file    Minutes per session: Not on file   Stress: Not on file  Relationships   Social connections    Talks on phone: Not on file    Gets together: Not on file    Attends religious service: Not on file    Active member of club or organization: Not on file    Attends meetings of clubs or organizations: Not on file    Relationship status: Not on file   Intimate partner violence    Fear of current or ex partner: Not on file    Emotionally abused: Not on file    Physically abused: Not on file    Forced sexual activity: Not on file  Other Topics Concern   Not on file  Social History Narrative   Not on file    FAMILY HISTORY:   Family Status  Relation Name Status   Mother  Deceased at age 75       Breast Cancer   Father  Alive       Diabetes   Sister  Alive       healthy   Daughter X2 TWINS Alive       2, twins   Son  Comptroller   Other  (Not Specified)    ROS: Review of Systems  Constitutional: Positive for malaise/fatigue.  HENT: Negative.   Eyes: Negative.   Respiratory: Negative.   Cardiovascular: Negative.   Gastrointestinal:  Negative.   Genitourinary: Negative.   Musculoskeletal: Negative.   Skin: Negative.      PHYSICAL EXAMINATION:    VITALS:   Vitals:   07/01/19 1443  BP: 120/72  Pulse: 86  Temp: 98.9 F (37.2 C)  SpO2: 98%  Weight: 169 lb  9.6 oz (76.9 kg)  Height: 6\' 2"  (1.88 m)   Wt Readings from Last 3 Encounters:  07/01/19 169 lb 9.6 oz (76.9 kg)  02/12/19 170 lb (77.1 kg)  10/19/18 170 lb (77.1 kg)    GEN:  The patient appears stated age and is in NAD. HEENT:  Normocephalic, atraumatic.  The mucous membranes are moist. The superficial temporal arteries are without ropiness or tenderness. CV:  RRR Lungs:  CTAB Neck/HEME:  There are no carotid bruits bilaterally.  Neurological examination:  Orientation: The patient is alert and oriented x3. Cranial nerves: There is good facial symmetry. The speech is fluent and clear. Soft palate rises symmetrically and there is no tongue deviation. Hearing is intact to conversational tone. Sensation: Sensation is intact to light touch throughout Motor: Strength is 5/5 in the bilateral upper and lower extremities.   Shoulder shrug is equal and symmetric.  There is no pronator drift.  Movement examination: Tone: There is normal tone in the upper extremities.  There is just mild increased tone in the left lower extremity.  There is normal tone in the right lower extremity. Abnormal movements: mild L shoulder dyskinesia  Coordination:  There is no decremation with RAM's, with any form of RAMS, including alternating supination and pronation of the forearm, hand opening and closing, finger taps, heel taps and toe taps. Gait and Station: The patient has no difficulty arising out of a deep-seated chair without the use of the hands. The patient's stride length is good with good armswing.     ASSESSMENT/PLAN:  1. idiopathic Parkinson's disease.  The patient has tremor, bradykinesia, rigidity and mild postural instability.  -The patient underwent bilateral  STN DBS with [facility] [employer] device on 02/10/2017 and had IPG placement on 02/17/2017.  [employer] device has gotten FDA approval for MRI compatibility but his battery would need to be changed out.  He would like to proceed with that.  Will send referral to Dr. Venetia Maxon  -He will continue rytary 145 mg, 1 tablet 5 times per day.  I do think that his intermittent speech issues are related to extra [name] as opposed to stimulation, given that speech issues are intermittent.  He does ask for referral to speech therapy and I did send that referral today.  -Patient has occasional dyskinesia, likely related to medication.  I adjusted the device somewhat today, but also think that the dyskinesia I saw today was the fact that he took an extra [name]. 2.  Fatigue   -Ultimately, this is likely related to the disease, but I do not think that there are any medications that are going to help.  We have tried Provigil, Nuvigil and selegiline, all without relief.  He is exercising. 3.  REM behavior disorder.  -Better with clonazepam 0.5 mg, half a tablet at night.  He will let me know when he needs a refill. 4.  Follow-up in the next 4 months, sooner should new neurologic issues arise.

## 2019-07-01 ENCOUNTER — Other Ambulatory Visit: Payer: Self-pay

## 2019-07-01 ENCOUNTER — Encounter: Payer: Self-pay | Admitting: Neurology

## 2019-07-01 ENCOUNTER — Ambulatory Visit (INDEPENDENT_AMBULATORY_CARE_PROVIDER_SITE_OTHER): Payer: Medicare Other | Admitting: Neurology

## 2019-07-01 VITALS — BP 120/72 | HR 86 | Temp 98.9°F | Ht 74.0 in | Wt 169.6 lb

## 2019-07-01 DIAGNOSIS — R5383 Other fatigue: Secondary | ICD-10-CM | POA: Diagnosis not present

## 2019-07-01 DIAGNOSIS — G20 Parkinson's disease: Secondary | ICD-10-CM | POA: Diagnosis not present

## 2019-07-01 NOTE — Procedures (Signed)
DBS Programming was performed.    Manufacturer of DBS device: [employer]  Total time spent programming was 24 minutes.  Device was confirmed to be on.  Soft start was confirmed to be on.  Impedences were checked and were within normal limits.  Battery was checked and was determined to be functioning normally and not near the end of life.  Final settings were as follows:  Left brain electrode:     3-(90%)4-(10%)C+           ; Amplitude  3.4   mA   ; Pulse width 60 microseconds;   Frequency   130   Hz.  Right brain electrode:     10-(30%)11-(70%)C+          ; Amplitude   2.4  mA ;  Pulse width 60  microseconds;  Frequency   130    Hz.

## 2019-07-15 ENCOUNTER — Ambulatory Visit: Payer: Medicare Other | Attending: Neurology

## 2019-07-15 ENCOUNTER — Other Ambulatory Visit: Payer: Self-pay

## 2019-07-15 DIAGNOSIS — R471 Dysarthria and anarthria: Secondary | ICD-10-CM | POA: Diagnosis not present

## 2019-07-15 NOTE — Patient Instructions (Signed)
   Read out loud for 3 minutes, 3-4 times. Go back and constructively critique yourself. You may want to repeat phrases that were spoken especially fast.

## 2019-07-15 NOTE — Therapy (Signed)
[facility] [address] [place], [place], [address] Phone: [phone]   Fax:  [phone]  Speech Language Pathology Evaluation  Patient Details  Name: Ian PurserKenneth [name] MRN: [id number] Date of Birth: 09-10-1961 Referring Provider (SLP): [name], [name], DO   Encounter Date: 07/15/2019  End of Session - 07/15/19 1540    Visit Number  1    Number of Visits  17    Date for SLP Re-Evaluation  09/13/19    SLP Start Time  1006    SLP Stop Time   1046    SLP Time Calculation (min)  40 min    Activity Tolerance  Patient tolerated treatment well       Past Medical History:  Diagnosis Date  . Anxiety   . History of kidney stones    passed  . PD (Parkinson's disease) (HCC) 09/10/2013  . PONV (postoperative nausea and vomiting)    nausea     Past Surgical History:  Procedure Laterality Date  . COLONOSCOPY    . MINOR PLACEMENT OF FIDUCIAL N/A 01/31/2017   Procedure: MINOR PLACEMENT OF FIDUCIAL;  Surgeon: [name], MD;  [facility];  Service: Neurosurgery;  Laterality: N/A;  Fiducial placement  . none    . PULSE GENERATOR IMPLANT Right 02/17/2017   Procedure: Right implantable pulse generator placement;  Surgeon: [name], MD;  [facility];  Service: Neurosurgery;  Laterality: Right;  Bilateral implantable pulse generator placement  . SUBTHALAMIC STIMULATOR BATTERY REPLACEMENT N/A 10/19/2018   Procedure: Deep Brain stimulator implantable pulse generator revision;  Surgeon: [name], [name], MD;  [facility];  Service: Neurosurgery;  Laterality: N/A;  Deep Brain stimulator implantable pulse generator revision  . SUBTHALAMIC STIMULATOR INSERTION Bilateral 02/10/2017   Procedure: Bilateral Deep brain stimulator placement;  Surgeon: [name], MD;  [facility];  Service: Neurosurgery;  Laterality: Bilateral;  Bilateral Deep brain stimulator placement  . UPPER GASTROINTESTINAL ENDOSCOPY      There were no  vitals filed for this visit.      SLP Evaluation [facility] - 07/15/19 1012      SLP Visit Information   SLP Received On  07/15/19    Referring Provider (SLP)  [name], [name], DO    Onset Date  diagnosed fall 2014    Medical Diagnosis  Parkinson's Disease      Subjective   Subjective  "I think it's more the rapid speech and not clearly articulating my words."    Patient/Family Stated Goal  Improved intelligibility      General Information   HPI  Pt well-known at this clinic from previous courses of therapy.       Balance Screen   Has the patient fallen in the past 6 months  No      Prior Functional Status   Cognitive/Linguistic Baseline  Within functional limits      Cognition   Overall Cognitive Status  Within Functional Limits for tasks assessed    Behaviors  Restless      Auditory Comprehension   Overall Auditory Comprehension  Appears within functional limits for tasks assessed      Verbal Expression   Overall Verbal Expression  Appears within functional limits for tasks assessed      Oral Motor/Sensory Function   Overall Oral Motor/Sensory Function  --   deferred due to clinic masking guidelines     Motor Speech   Overall Motor Speech  Impaired    Articulation  Impaired  Level of Impairment  Sentence    Intelligibility  Intelligible   "go slower and be more articulate"    Effective Techniques  Slow rate                      SLP Education - 07/15/19 1539    Education Details  compensations for incr'd speech cllarity    Person(s) Educated  Patient    Methods  Explanation;Demonstration    Comprehension  Verbalized understanding;Returned demonstration;Verbal cues required;Need further instruction       SLP Short Term Goals - 07/15/19 1546      SLP SHORT TERM GOAL #1   Title  pt will produce 19/20 sentences with reduced speech rate over 3 sessions    Time  4    Period  Weeks    Status  New      SLP SHORT TERM GOAL #2   Title  pt will demo  slower speech producing >95% intelligible speech in 7 minutes simple conversation outside ST room    Time  4    Period  Weeks    Status  New       SLP Long Term Goals - 07/15/19 1547      SLP LONG TERM GOAL #1   Title  pt will demo slower speech producing >95% intelligible speech in 10 minutes simple to mod complex conversation outside ST room x3 sessions    Time  8    Period  Weeks    Status  New      SLP LONG TERM GOAL #2   Title  pt will report average no more than 2 requests asking him to repeat daily    Time  8    Period  Weeks    Status  New       Plan - 07/15/19 1541    Clinical Impression Statement  Pt presents today with short rushes of speech occurring occasionally in 20 minutes of pt's conversational speech, minimal decrease in pt's intelligibility. Loudness measured in conversation as upper 60s-low 70s dB, masked, with sound level meter approx 30 cm away from pt's mouth. Pt reports his wife and others are asking him to repeat more than 6-9 months ago. During a reading task today pt read the rainbow passage (entire passage) in 2 minutes 11 seconds with 7 episodes of decr'd intelligibility and affecting 10 words. When pt reduced his speaking rate in order to increase speech precision he read the same passage only 7 seconds longer, and only had 3 episodes of decr'd intelligibility affecting 4 words. Pt's swallowing and cognition appear WNL/WFL, and pt denies any aphasic errors/difficulty. SLP believes pt would benefit from skilled ST focusing on compensation strategy of reduced speech rate.    Speech Therapy Frequency  2x / week    Duration  --   8 weeks or 17 sessions   Treatment/Interventions  Functional tasks;SLP instruction and feedback;Compensatory strategies;Internal/external aids;Patient/family education    Potential to Achieve Goals  Good    SLP Home Exercise Plan  reading - provided today    Consulted and Agree with Plan of Care  Patient       Patient will benefit  from skilled therapeutic intervention in order to improve the following deficits and impairments:   1. Dysarthria and anarthria       Problem List Patient Active Problem List   Diagnosis Date Noted  . Nonallopathic lesion of sacral region 07/25/2018  . Nonallopathic lesion of lumbosacral  region 07/25/2018  . 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 Acute bursitis of right shoulder 12/06/2017  . Antalgic gait 01/24/2017  . Intercostal muscle tear 07/19/2016  . PD (Parkinson's disease) ([place]) 06/24/2016  . 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 Nonallopathic lesion of thoracic region 01/25/2016  . Nonallopathic lesion-rib cage 01/25/2016  . Nonallopathic lesion of cervical region 01/25/2016  . Parkinson's disease ([place]) 01/25/2016  . Lateral epicondylitis 12/01/2014  . REM behavioral disorder 08/27/2014  . Paralysis agitans ([place]) 10/18/2013    [place], [place]  07/15/2019, 3:51 PM  [place] [address] [place], [place], [address] Phone: [phone]   Fax:  [phone]  Name: Taysen Bushart MRN: [id number] Date of Birth: 21-Apr-1961

## 2019-07-17 ENCOUNTER — Encounter: Payer: Self-pay | Admitting: Speech Pathology

## 2019-07-17 ENCOUNTER — Ambulatory Visit: Payer: Medicare Other | Admitting: Speech Pathology

## 2019-07-17 ENCOUNTER — Other Ambulatory Visit: Payer: Self-pay

## 2019-07-17 DIAGNOSIS — R471 Dysarthria and anarthria: Secondary | ICD-10-CM | POA: Diagnosis not present

## 2019-07-17 NOTE — Therapy (Signed)
[facility] [address] [place], [place], [address] Phone: [phone]   Fax:  [phone]  Speech Language Pathology Treatment  Patient Details  Name: Ian [name] MRN: [id number] Date of Birth: [birth year] Referring Provider (SLP): [name], [name], DO   Encounter Date: 07/17/2019  End of Session - 07/17/19 1302    Visit Number  2    Number of Visits  17    Date for SLP Re-Evaluation  09/13/19    SLP Start Time  1202    SLP Stop Time   1246    SLP Time Calculation (min)  44 min    Activity Tolerance  Patient tolerated treatment well       Past Medical History:  Diagnosis Date  . Anxiety   . History of kidney stones    passed  . PD (Parkinson's disease) (HCC) 09/10/2013  . PONV (postoperative nausea and vomiting)    nausea     Past Surgical History:  Procedure Laterality Date  . COLONOSCOPY    . MINOR PLACEMENT OF FIDUCIAL N/A 01/31/2017   Procedure: MINOR PLACEMENT OF FIDUCIAL;  Surgeon: [name], MD;  [facility];  Service: Neurosurgery;  Laterality: N/A;  Fiducial placement  . none    . PULSE GENERATOR IMPLANT Right 02/17/2017   Procedure: Right implantable pulse generator placement;  Surgeon: [name], MD;  [facility];  Service: Neurosurgery;  Laterality: Right;  Bilateral implantable pulse generator placement  . SUBTHALAMIC STIMULATOR BATTERY REPLACEMENT N/A 10/19/2018   Procedure: Deep Brain stimulator implantable pulse generator revision;  Surgeon: [name], [name], MD;  [facility];  Service: Neurosurgery;  Laterality: N/A;  Deep Brain stimulator implantable pulse generator revision  . SUBTHALAMIC STIMULATOR INSERTION Bilateral 02/10/2017   Procedure: Bilateral Deep brain stimulator placement;  Surgeon: [name], MD;  [facility];  Service: Neurosurgery;  Laterality: Bilateral;  Bilateral Deep brain stimulator placement  . UPPER GASTROINTESTINAL ENDOSCOPY      There were no vitals  filed for this visit.  Subjective Assessment - 07/17/19 1227    Currently in Pain?  No/denies            ADULT SLP TREATMENT - 07/17/19 1227      General Information   Behavior/Cognition  Alert;Cooperative;Pleasant mood      Treatment Provided   Treatment provided  Cognitive-Linquistic      Cognitive-Linquistic Treatment   Treatment focused on  Dysarthria    Skilled Treatment  Utilized tapping with min A to successfully reduce rate - pt taps his foot. Rate WNL over 15 minute conversation with tapping. Introduced pt to metronome app as another option. He also had reduced rate with metromone over 8 minute conversation. He felt that this was an external reminder to keep his rate slower. We discussed the metronome as an option for phone calls as the other person can't see or hear the metronome. Instructed pt that the ST room has no distractions so he can focus on his rate. Instructed pt to practice in conversations at home with no distractions - set a time to practice each day , or practice with no distractions on the phone with his sister.       Assessment / Recommendations / Plan   Plan  Continue with current plan of care      Progression Toward Goals   Progression toward goals  Progressing toward goals       SLP Education - 07/17/19 1257    Education Details  compensations for intelligibility; practice strategies at home with no distractions    Person(s) Educated  Patient    Methods  Explanation;Demonstration;Verbal cues;Handout    Comprehension  Verbalized understanding;Returned demonstration;Verbal cues required;Need further instruction       SLP Short Term Goals - 07/17/19 1301      SLP SHORT TERM GOAL #1   Title  pt will produce 19/20 sentences with reduced speech rate over 3 sessions    Time  4    Period  Weeks    Status  On-going      SLP SHORT TERM GOAL #2   Title  pt will demo slower speech producing >95% intelligible speech in 7 minutes simple conversation  outside ST room    Time  4    Period  Weeks    Status  On-going       SLP Long Term Goals - 07/17/19 1302      SLP LONG TERM GOAL #1   Title  pt will demo slower speech producing >95% intelligible speech in 10 minutes simple to mod complex conversation outside [place] room x3 sessions    Time  8    Period  Weeks    Status  On-going      SLP LONG TERM GOAL #2   Title  pt will report average no more than 2 requests asking him to repeat daily    Time  8    Period  Weeks    Status  On-going       Plan - 07/17/19 1258    Clinical Impression Statement  Pt continues to require occasional min A for WNL rate of speech. Strategies of tapping and metronome employed. With strategies and rare min A pt maintains appropriate rate of speech and intelligilbity. Continue skilled ST to maximize intelligilbity across community settings and environments.    Speech Therapy Frequency  2x / week    Duration  --   8 weeks or 17 visits   Treatment/Interventions  Functional tasks;SLP instruction and feedback;Compensatory strategies;Internal/external aids;Patient/family education    Potential to Achieve Goals  Good       Patient will benefit from skilled therapeutic intervention in order to improve the following deficits and impairments:   1. Dysarthria and anarthria       Problem List Patient Active Problem List   Diagnosis Date Noted  . Nonallopathic lesion of sacral region 07/25/2018  . Nonallopathic lesion of lumbosacral region 07/25/2018  . 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 Acute bursitis of right shoulder 12/06/2017  . Antalgic gait 01/24/2017  . Intercostal muscle tear 07/19/2016  . PD (Parkinson's disease) ([place]) 06/24/2016  . 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 Nonallopathic lesion of thoracic region 01/25/2016  . Nonallopathic lesion-rib cage 01/25/2016  . Nonallopathic lesion of cervical region 01/25/2016  . Parkinson's disease ([place]) 01/25/2016  . Lateral epicondylitis  12/01/2014  . REM behavioral disorder 08/27/2014  . Paralysis agitans ([place]) 10/18/2013    Irma Delancey, Annye Rusk MS, CCC-SLP 07/17/2019, 1:03 PM  [place] [address] [place], [place], [address] Phone: [phone]   Fax:  [phone]   Name: Ian [name] MRN: [id number] Date of Birth: [birth year]

## 2019-07-17 NOTE — Patient Instructions (Signed)
  Practice at home with your wife - set up a time without distractions to practice tapping or metronome app to facilitate carryover   Read the sheet on making pictures in your mind of each word you say - practice this a few times to see if it help   Chunk your words in to 3-4 words then take a pause  Speak in short phrases then pause

## 2019-07-23 ENCOUNTER — Other Ambulatory Visit: Payer: Self-pay

## 2019-07-23 ENCOUNTER — Ambulatory Visit: Payer: Medicare Other | Attending: Neurology

## 2019-07-23 DIAGNOSIS — R471 Dysarthria and anarthria: Secondary | ICD-10-CM | POA: Diagnosis not present

## 2019-07-23 NOTE — Therapy (Signed)
[facility] [address] [place], [place], [address] Phone: [phone]   Fax:  [phone]  Speech Language Pathology Treatment  Patient Details  Name: Ian [name] MRN: [id number] Date of Birth: 1961/02/15 Referring Provider (SLP): Alonza Bogus, DO   Encounter Date: 07/23/2019  End of Session - 07/23/19 1407    Visit Number  3    Number of Visits  17    Date for SLP Re-Evaluation  09/13/19    SLP Start Time  1150    SLP Stop Time   1230    SLP Time Calculation (min)  40 min    Activity Tolerance  Patient tolerated treatment well       Past Medical History:  Diagnosis Date  . Anxiety   . History of kidney stones    passed  . PD (Parkinson's disease) ([place]) 09/10/2013  . PONV (postoperative nausea and vomiting)    nausea     Past Surgical History:  Procedure Laterality Date  . COLONOSCOPY    . MINOR PLACEMENT OF FIDUCIAL N/A 01/31/2017   Procedure: MINOR PLACEMENT OF FIDUCIAL;  Surgeon: Erline Levine, MD;  Location: [facility];  Service: Neurosurgery;  Laterality: N/A;  Fiducial placement  . none    . PULSE GENERATOR IMPLANT Right 02/17/2017   Procedure: Right implantable pulse generator placement;  Surgeon: Erline Levine, MD;  Location: [facility];  Service: Neurosurgery;  Laterality: Right;  Bilateral implantable pulse generator placement  . SUBTHALAMIC STIMULATOR BATTERY REPLACEMENT N/A 10/19/2018   Procedure: Deep Brain stimulator implantable pulse generator revision;  Surgeon: Erline Levine, MD;  Location: [facility];  Service: Neurosurgery;  Laterality: N/A;  Deep Brain stimulator implantable pulse generator revision  . SUBTHALAMIC STIMULATOR INSERTION Bilateral 02/10/2017   Procedure: Bilateral Deep brain stimulator placement;  Surgeon: Erline Levine, MD;  Location: [facility];  Service: Neurosurgery;  Laterality: Bilateral;  Bilateral Deep brain stimulator placement  . UPPER GASTROINTESTINAL ENDOSCOPY      There were no vitals  filed for this visit.  Subjective Assessment - 07/23/19 1154    Subjective  "I didn't practice much because I moved my son to [place] over the weekend."    Currently in Pain?  No/denies            ADULT SLP TREATMENT - 07/23/19 1157      General Information   Behavior/Cognition  Alert;Cooperative;Pleasant mood      Treatment Provided   Treatment provided  Cognitive-Linquistic      Cognitive-Linquistic Treatment   Treatment focused on  Dysarthria    Skilled Treatment  Pt reported he was unable to practice Thurs, Fri Sat due to moving son in [place]. Pt reports he has practiced Sunday and yesterday. Saturday, pt will go to beach with sister. SLP had pt practice slower speech using tapping as cue, explaining what SPECIFICALLY he is going to do to practice his slower speech while on vacation. Pt slowed rate 90% of the time when utilizing foot tap. Outdoors, pt maintained WNL speech rate with minimal tapping - I suspect pt used cadence of pace/gait for slowing rate. Upon entering ST room after 10 minutes outdoors pt with some initial rushing and decr'd intelligibility however by 60 seconds into continued conversation, pt spontaneously began and maintained slower/WNL rate.       Assessment / Recommendations / Plan   Plan  Continue with current plan of care      Progression Toward Goals   Progression toward goals  Progressing toward  goals         SLP Short Term Goals - 07/23/19 1418      SLP SHORT TERM GOAL #1   Title  pt will produce 19/20 sentences with reduced speech rate over 3 sessions    Time  3    Period  Weeks    Status  On-going      SLP SHORT TERM GOAL #2   Title  pt will demo slower speech producing >95% intelligible speech in 7 minutes simple conversation outside ST room    Time  3    Period  Weeks    Status  On-going       SLP Long Term Goals - 07/23/19 1418      SLP LONG TERM GOAL #1   Title  pt will demo slower speech producing >95% intelligible  speech in 15 minutes simple to mod complex conversation outside ST room x3 sessions    Time  7    Period  Weeks    Status  Revised   from "10 minutes" to "15 mintues"     SLP LONG TERM GOAL #2   Title  pt will report average no more than 2 requests asking him to repeat daily    Time  7    Period  Weeks    Status  On-going       Plan - 07/23/19 1408    Clinical Impression Statement  Pt continues to require SLP A for WNL rate of speech. Strategies of tapping and metronome employed. With strategies and rare min A pt maintains appropriate rate of speech and intelligilbity. Continue skilled ST to maximize intelligilbity across community settings and environments.    Speech Therapy Frequency  2x / week    Duration  --   8 weeks or 17 visits   Treatment/Interventions  Functional tasks;SLP instruction and feedback;Compensatory strategies;Internal/external aids;Patient/family education    Potential to Achieve Goals  Good       Patient will benefit from skilled therapeutic intervention in order to improve the following deficits and impairments:   1. Dysarthria and anarthria       Problem List Patient Active Problem List   Diagnosis Date Noted  . Nonallopathic lesion of sacral region 07/25/2018  . Nonallopathic lesion of lumbosacral region 07/25/2018  . 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 Acute bursitis of right shoulder 12/06/2017  . Antalgic gait 01/24/2017  . Intercostal muscle tear 07/19/2016  . PD (Parkinson's disease) ([facility]) 06/24/2016  . 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 Nonallopathic lesion of thoracic region 01/25/2016  . Nonallopathic lesion-rib cage 01/25/2016  . Nonallopathic lesion of cervical region 01/25/2016  . Parkinson's disease (H[facility]) 01/25/2016  . Lateral epicondylitis 12/01/2014  . REM behavioral disorder 08/27/2014  . Paralysis agitans ([facility]) 10/18/2013    SCHINKE,CARL ,MS, CCC-SLP  07/23/2019, 2:19 PM  [facility] [address] [place], [place], [address] Phone: [phone]   Fax:  [phone]   Name: Ian [name] MRN: [id number] Date of Birth: [birth year]

## 2019-07-25 ENCOUNTER — Ambulatory Visit: Payer: Medicare Other

## 2019-07-25 ENCOUNTER — Other Ambulatory Visit: Payer: Self-pay

## 2019-07-25 DIAGNOSIS — R471 Dysarthria and anarthria: Secondary | ICD-10-CM | POA: Diagnosis not present

## 2019-07-25 NOTE — Patient Instructions (Signed)
Use your walks to your advantage to practice  Do practice EVERYDAY!

## 2019-07-25 NOTE — Therapy (Signed)
[facility] [address] [place], [place], [address] Phone: [phone]   Fax:  [phone]  Speech Language Pathology Treatment  Patient Details  Name: Ian [name] MRN: [id number] Date of Birth: July 12, 1961 Referring Provider (SLP): [name], [name], DO   Encounter Date: 07/25/2019  End of Session - 07/25/19 1607    Visit Number  4    Number of Visits  17    Date for SLP Re-Evaluation  09/13/19    SLP Start Time  1448    SLP Stop Time   1530    SLP Time Calculation (min)  42 min    Activity Tolerance  Patient tolerated treatment well       Past Medical History:  Diagnosis Date  . Anxiety   . History of kidney stones    passed  . PD (Parkinson's disease) ([facility]) 09/10/2013  . PONV (postoperative nausea and vomiting)    nausea     Past Surgical History:  Procedure Laterality Date  . COLONOSCOPY    . MINOR PLACEMENT OF FIDUCIAL N/A 01/31/2017   Procedure: MINOR PLACEMENT OF FIDUCIAL;  Surgeon: [name], MD;  [facility];  Service: Neurosurgery;  Laterality: N/A;  Fiducial placement  . none    . PULSE GENERATOR IMPLANT Right 02/17/2017   Procedure: Right implantable pulse generator placement;  Surgeon: [name], MD;  [facility];  Service: Neurosurgery;  Laterality: Right;  Bilateral implantable pulse generator placement  . SUBTHALAMIC STIMULATOR BATTERY REPLACEMENT N/A 10/19/2018   Procedure: Deep Brain stimulator implantable pulse generator revision;  Surgeon: [name], [name], MD;  [facility];  Service: Neurosurgery;  Laterality: N/A;  Deep Brain stimulator implantable pulse generator revision  . SUBTHALAMIC STIMULATOR INSERTION Bilateral 02/10/2017   Procedure: Bilateral Deep brain stimulator placement;  Surgeon: [name], MD;  [facility];  Service: Neurosurgery;  Laterality: Bilateral;  Bilateral Deep brain stimulator placement  . UPPER GASTROINTESTINAL ENDOSCOPY      There were no vitals  filed for this visit.  Subjective Assessment - 07/25/19 1450    Subjective  "Yesterday my wife and I were practicing and she said I sounded a little robotic."    Currently in Pain?  No/denies            ADULT SLP TREATMENT - 07/25/19 1453      General Information   Behavior/Cognition  Alert;Cooperative;Pleasant mood      Treatment Provided   Treatment provided  Cognitive-Linquistic      Cognitive-Linquistic Treatment   Treatment focused on  Dysarthria    Skilled Treatment  In the therapy room, pt spoke with reduced rate with rare min A/nonverbal cues. When he and SLP went outside and spoke about a topic of interest to pt his rate sped up more frequently and he req'd more frequent cues for reduced rate and to utilize tapping technique to slow rate. Occasional min cues necessary. SLP told pt to practice with wife walking - for first 7 mintues to practice with tapping, at a pace he does not need to think about his gait.       Assessment / Recommendations / Plan   Plan  Continue with current plan of care      Progression Toward Goals   Progression toward goals  Progressing toward goals         SLP Short Term Goals - 07/25/19 1608      SLP SHORT TERM GOAL #1   Title  pt will produce  19/20 sentences with reduced speech rate over 3 sessions    Baseline  07-25-19    Time  3    Period  Weeks    Status  On-going      SLP SHORT TERM GOAL #2   Title  pt will demo slower speech producing >95% intelligible speech in 7 minutes simple conversation outside ST room    Time  3    Period  Weeks    Status  On-going       SLP Long Term Goals - 07/25/19 1608      SLP LONG TERM GOAL #1   Title  pt will demo slower speech producing >95% intelligible speech in 15 minutes simple to mod complex conversation outside ST room x3 sessions    Time  7    Period  Weeks    Status  Revised   from "10 minutes" to "15 mintues"     SLP LONG TERM GOAL #2   Title  pt will report average no more  than 2 requests asking him to repeat daily    Time  7    Period  Weeks    Status  On-going       Plan - 07/25/19 1607    Clinical Impression Statement  Pt continues to require SLP A for WNL rate of speech, especially when multitasking and/or talking with a topic of interest. Strategies of tapping and metronome employed. Continue skilled ST to maximize intelligilbity across community settings and environments.    Speech Therapy Frequency  2x / week    Duration  --   8 weeks or 17 visits   Treatment/Interventions  Functional tasks;SLP instruction and feedback;Compensatory strategies;Internal/external aids;Patient/family education    Potential to Achieve Goals  Good       Patient will benefit from skilled therapeutic intervention in order to improve the following deficits and impairments:   1. Dysarthria and anarthria       Problem List Patient Active Problem List   Diagnosis Date Noted  . Nonallopathic lesion of sacral region 07/25/2018  . Nonallopathic lesion of lumbosacral region 07/25/2018  . 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 Acute bursitis of right shoulder 12/06/2017  . Antalgic gait 01/24/2017  . Intercostal muscle tear 07/19/2016  . PD (Parkinson's disease) ([place]) 06/24/2016  . 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 Nonallopathic lesion of thoracic region 01/25/2016  . Nonallopathic lesion-rib cage 01/25/2016  . Nonallopathic lesion of cervical region 01/25/2016  . Parkinson's disease ([facility]) 01/25/2016  . Lateral epicondylitis 12/01/2014  . REM behavioral disorder 08/27/2014  . Paralysis agitans ([facility]) 10/18/2013    [place] ,[place], [place]  07/25/2019, 4:09 PM  [facility] [address] [place], [place], [address] Phone: [phone]   Fax:  [phone]   Name: Ian [name] MRN: [id number] Date of Birth: July 12, 1961

## 2019-08-06 ENCOUNTER — Ambulatory Visit: Payer: Medicare Other

## 2019-08-06 MED ORDER — CLONAZEPAM 0.5 MG PO TABS: 0.2500 mg | ORAL_TABLET | Freq: Every day | ORAL | 1 refills | Status: DC

## 2019-08-06 MED ORDER — RYTARY 36.25-145 MG PO CPCR: 2.0000 | ORAL_CAPSULE | Freq: Four times a day (QID) | ORAL | 5 refills | Status: DC

## 2019-08-06 NOTE — Telephone Encounter (Signed)
Requested Prescriptions   Pending Prescriptions Disp Refills  . clonazePAM (KLONOPIN) 0.5 MG tablet 45 tablet 1    Sig: Take 0.5 tablets (0.25 mg total) by mouth at bedtime.  . Carbidopa-Levodopa ER ([name]) 36.25-145 MG CPCR 180 capsule 5    Sig: Take 2 capsules by mouth 3 (three) times daily.   Rx last filled:02/12/19 #45 1 refills 01/21/19 #180 5 refills  Pt last seen:07/01/19  Follow up appt scheduled:11/18/19  Ok to send in [place] for 7 times a day?

## 2019-08-08 ENCOUNTER — Ambulatory Visit: Payer: Medicare Other

## 2019-08-12 ENCOUNTER — Encounter: Payer: Self-pay | Admitting: Speech Pathology

## 2019-08-12 ENCOUNTER — Ambulatory Visit: Payer: Medicare Other | Admitting: Speech Pathology

## 2019-08-12 ENCOUNTER — Other Ambulatory Visit: Payer: Self-pay

## 2019-08-12 DIAGNOSIS — R471 Dysarthria and anarthria: Secondary | ICD-10-CM | POA: Diagnosis not present

## 2019-08-12 NOTE — Patient Instructions (Signed)
   Continue reading slowly to get in the habit of talking slow for the day  Practice slow rate in conversations and while multi tasking  You are doing a great job practicing! Keep up the good work!

## 2019-08-12 NOTE — Therapy (Signed)
[facility] [address] [place], [place], [address] Phone: [phone]   Fax:  [phone]  Speech Language Pathology Treatment  Patient Details  Name: Ian [name] MRN: [id number] Date of Birth: 06/14/1961 Referring Provider (SLP): Alonza Bogus, DO   Encounter Date: 08/12/2019  End of Session - 08/12/19 1411    Visit Number  5    Number of Visits  17    Date for SLP Re-Evaluation  09/13/19    SLP Start Time  9417    SLP Stop Time   1500    SLP Time Calculation (min)  102 min    Activity Tolerance  Patient tolerated treatment well       Past Medical History:  Diagnosis Date  . Anxiety   . History of kidney stones    passed  . PD (Parkinson's disease) ([facility]) 09/10/2013  . PONV (postoperative nausea and vomiting)    nausea     Past Surgical History:  Procedure Laterality Date  . COLONOSCOPY    . MINOR PLACEMENT OF FIDUCIAL N/A 01/31/2017   Procedure: MINOR PLACEMENT OF FIDUCIAL;  Surgeon: Erline Levine, MD;  Location: [facility];  Service: Neurosurgery;  Laterality: N/A;  Fiducial placement  . none    . PULSE GENERATOR IMPLANT Right 02/17/2017   Procedure: Right implantable pulse generator placement;  Surgeon: Erline Levine, MD;  Location: [facility];  Service: Neurosurgery;  Laterality: Right;  Bilateral implantable pulse generator placement  . SUBTHALAMIC STIMULATOR BATTERY REPLACEMENT N/A 10/19/2018   Procedure: Deep Brain stimulator implantable pulse generator revision;  Surgeon: Erline Levine, MD;  Location: [facility];  Service: Neurosurgery;  Laterality: N/A;  Deep Brain stimulator implantable pulse generator revision  . SUBTHALAMIC STIMULATOR INSERTION Bilateral 02/10/2017   Procedure: Bilateral Deep brain stimulator placement;  Surgeon: Erline Levine, MD;  Location: [facility];  Service: Neurosurgery;  Laterality: Bilateral;  Bilateral Deep brain stimulator placement  . UPPER GASTROINTESTINAL ENDOSCOPY      There were no  vitals filed for this visit.  Subjective Assessment - 08/12/19 1327    Subjective  "for the first time on Saturday my wife said I did real good and she didn't hardly have to ask me to repeat myself"    Currently in Pain?  No/denies            ADULT SLP TREATMENT - 08/12/19 1327      General Information   Behavior/Cognition  Alert;Cooperative;Pleasant mood      Treatment Provided   Treatment provided  Cognitive-Linquistic      Cognitive-Linquistic Treatment   Treatment focused on  Dysarthria    Skilled Treatment  Pt enters room with WNL rate of speech. Walking outside of the building, Pt required 2 cues to reduce rate towards end of walk. Pt reports he is hearing himself when he is incraseing his rate or becoming dysfluent. He reports he is able to stop and correct this. In longer, more complex conversation pt self corrected rate 3x over 20 minutes. He states it is easier to talk in speech therapy because he knows he can pause and doesn't feel awkward when pausing. I suggested that he educate his friends that he needs more time to speak. however Sudeep is not comforable with this at this time. Educated pt re: environmental controls that can make talking easier, including reducing background noise, facing the person he is talking to, limiting group size, and having family redue their rate also.       Assessment /  Recommendations / Plan   Plan  Continue with current plan of care      Progression Toward Goals   Progression toward goals  Progressing toward goals       SLP Education - 08/12/19 1405    Education Details  practice multitasking while keeping rate reduced       SLP Short Term Goals - 08/12/19 1410      SLP SHORT TERM GOAL #1   Title  pt will produce 19/20 sentences with reduced speech rate over 3 sessions    Baseline  07-25-19; 08/12/19    Time  2    Period  Weeks    Status  Achieved      SLP SHORT TERM GOAL #2   Title  pt will demo slower speech producing >95%  intelligible speech in 7 minutes simple conversation outside ST room    Time  3    Period  Weeks    Status  Achieved       SLP Long Term Goals - 08/12/19 1410      SLP LONG TERM GOAL #1   Title  pt will demo slower speech producing >95% intelligible speech in 15 minutes simple to mod complex conversation outside ST room x3 sessions    Baseline  08/12/19;    Time  6    Period  Weeks    Status  Revised   from "10 minutes" to "15 mintues"     SLP LONG TERM GOAL #2   Title  pt will report average no more than 2 requests asking him to repeat daily    Time  6    Period  Weeks    Status  On-going       Plan - 08/12/19 1405    Clinical Impression Statement  Pt rates his speech today at 8/10. He continues to require min A to maintain rate of speech while multi tasking. He is also self correcting his speech with min A. Continue skilled ST to maximize carryover of compensations for dysarthria and intelligibility. Will consider reducing to 1x a week next session if progress continues.    Speech Therapy Frequency  2x / week    Duration  --   8 weeks or 17 visits   Treatment/Interventions  Functional tasks;SLP instruction and feedback;Compensatory strategies;Internal/external aids;Patient/family education    Potential to Achieve Goals  Good       Patient will benefit from skilled therapeutic intervention in order to improve the following deficits and impairments:   Dysarthria and anarthria    Problem List Patient Active Problem List   Diagnosis Date Noted  . Nonallopathic lesion of sacral region 07/25/2018  . Nonallopathic lesion of lumbosacral region 07/25/2018  . 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 Acute bursitis of right shoulder 12/06/2017  . Antalgic gait 01/24/2017  . Intercostal muscle tear 07/19/2016  . PD (Parkinson's disease) (HCC) 06/24/2016  . 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 Nonallopathic lesion of thoracic region 01/25/2016  . Nonallopathic lesion-rib  cage 01/25/2016  . Nonallopathic lesion of cervical region 01/25/2016  . Parkinson's disease (HCC) 01/25/2016  . Lateral epicondylitis 12/01/2014  . REM behavioral disorder 08/27/2014  . Paralysis agitans (HCC) 10/18/2013    [place] Palla, Radene JourneyLaura Ann MS, CCC-SLP 08/12/2019, 2:11 PM  [facility] [address] [place], [place], [address] Phone: [phone]   Fax:  [phone]   Name: Ian PurserKenneth [name] MRN: [id number] Date of Birth: 1961/05/17

## 2019-08-14 ENCOUNTER — Other Ambulatory Visit: Payer: Self-pay

## 2019-08-14 ENCOUNTER — Ambulatory Visit: Payer: Medicare Other

## 2019-08-14 DIAGNOSIS — R471 Dysarthria and anarthria: Secondary | ICD-10-CM | POA: Diagnosis not present

## 2019-08-14 NOTE — Therapy (Signed)
[facility] [address] [place], [place], [address] Phone: [phone]   Fax:  [phone]  Speech Language Pathology Treatment  Patient Details  Name: Ian PurserKenneth [name] MRN: [id number] Date of Birth: Apr 22, 1961 Referring Provider (SLP): [name], [name], DO   Encounter Date: 08/14/2019  End of Session - 08/14/19 1719    Visit Number  6    Number of Visits  17    Date for SLP Re-Evaluation  09/13/19    SLP Start Time  1321   arrived at 1320   SLP Stop Time   1400    SLP Time Calculation (min)  39 min    Activity Tolerance  Patient tolerated treatment well       Past Medical History:  Diagnosis Date  . Anxiety   . History of kidney stones    passed  . PD (Parkinson's disease) (HCC) 09/10/2013  . PONV (postoperative nausea and vomiting)    nausea     Past Surgical History:  Procedure Laterality Date  . COLONOSCOPY    . MINOR PLACEMENT OF FIDUCIAL N/A 01/31/2017   Procedure: MINOR PLACEMENT OF FIDUCIAL;  Surgeon: [name], MD;  [facility];  Service: Neurosurgery;  Laterality: N/A;  Fiducial placement  . none    . PULSE GENERATOR IMPLANT Right 02/17/2017   Procedure: Right implantable pulse generator placement;  Surgeon: [name], MD;  [facility];  Service: Neurosurgery;  Laterality: Right;  Bilateral implantable pulse generator placement  . SUBTHALAMIC STIMULATOR BATTERY REPLACEMENT N/A 10/19/2018   Procedure: Deep Brain stimulator implantable pulse generator revision;  Surgeon: [name], [name], MD;  [facility];  Service: Neurosurgery;  Laterality: N/A;  Deep Brain stimulator implantable pulse generator revision  . SUBTHALAMIC STIMULATOR INSERTION Bilateral 02/10/2017   Procedure: Bilateral Deep brain stimulator placement;  Surgeon: [name], MD;  [facility];  Service: Neurosurgery;  Laterality: Bilateral;  Bilateral Deep brain stimulator placement  . UPPER GASTROINTESTINAL ENDOSCOPY       There were no vitals filed for this visit.  Subjective Assessment - 08/14/19 1337    Subjective  "It seems like it's harder for me to talk better at home than here."    Currently in Pain?  No/denies            ADULT SLP TREATMENT - 08/14/19 1338      General Information   Behavior/Cognition  Alert;Cooperative;Pleasant mood      Treatment Provided   Treatment provided  Cognitive-Linquistic      Cognitive-Linquistic Treatment   Treatment focused on  Dysarthria    Skilled Treatment  Pt entered room speaking WNL volume. Reports he is speeding up his speech and it seems to be worse at night SLP discussed with pt possibility of an external cue. SLP reminded pt of finger tapping for rhythm for speech on the phone - pt stated he does not do this as much when on the phone. SLP and pt walked outside - pt maintained slow rate and loudness WNL for this 12 minute conversation. SLP explained that pt would benefit from an external cue as his success in ST is increasing and an external cue would assist his success outside of therapy. Pt expressed understanding.       Assessment / Recommendations / Plan   Plan  Continue with current plan of care      Progression Toward Goals   Progression toward goals  Progressing toward goals       SLP Education -  08/14/19 1719    Education Details  Need an external cue/s for incr'd success outside of therapy    Person(s) Educated  Patient    Methods  Explanation    Comprehension  Verbalized understanding       SLP Short Term Goals - 08/14/19 1721      SLP SHORT TERM GOAL #1   Title  pt will produce 19/20 sentences with reduced speech rate over 3 sessions    Baseline  07-25-19; 08/12/19    Status  Achieved      SLP SHORT TERM GOAL #2   Title  pt will demo slower speech producing >95% intelligible speech in 7 minutes simple conversation outside ST room    Status  Achieved       SLP Long Term Goals - 08/14/19 1721      SLP LONG TERM GOAL #1    Title  pt will demo slower speech producing >95% intelligible speech in 15 minutes simple to mod complex conversation outside [place] room x3 sessions    Baseline  08/12/19;    Time  6    Period  Weeks    Status  On-going   from "10 minutes" to "15 mintues"     SLP LONG TERM GOAL #2   Title  pt will report average no more than 2 requests asking him to repeat daily    Time  6    Period  Weeks    Status  On-going       Plan - 08/14/19 1720    Clinical Impression Statement  He continues to require min A to maintain rate of speech while multi tasking. He is also self correcting his speech with min A. Continue skilled ST to maximize carryover of compensations for dysarthria and intelligibility.    Speech Therapy Frequency  1x /week    Duration  --   8 weeks (17 total sessions)   Treatment/Interventions  Functional tasks;SLP instruction and feedback;Compensatory strategies;Internal/external aids;Patient/family education    Potential to Achieve Goals  Good       Patient will benefit from skilled therapeutic intervention in order to improve the following deficits and impairments:   Dysarthria and anarthria    Problem List Patient Active Problem List   Diagnosis Date Noted  . Nonallopathic lesion of sacral region 07/25/2018  . Nonallopathic lesion of lumbosacral region 07/25/2018  . 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 Acute bursitis of right shoulder 12/06/2017  . Antalgic gait 01/24/2017  . Intercostal muscle tear 07/19/2016  . PD (Parkinson's disease) ([place]) 06/24/2016  . 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 Nonallopathic lesion of thoracic region 01/25/2016  . Nonallopathic lesion-rib cage 01/25/2016  . Nonallopathic lesion of cervical region 01/25/2016  . Parkinson's disease ([place]) 01/25/2016  . Lateral epicondylitis 12/01/2014  . REM behavioral disorder 08/27/2014  . Paralysis agitans ([place]) 10/18/2013    [facility] ,[place], [place]  08/14/2019, 5:21 PM  [place] [address] [place], [place], [address] Phone: [phone]   Fax:  [phone]   Name: Ian [name] MRN: [id number] Date of Birth: April 10, 1961

## 2019-08-19 ENCOUNTER — Ambulatory Visit: Payer: Medicare Other

## 2019-08-21 ENCOUNTER — Ambulatory Visit: Payer: Medicare Other | Attending: Neurology | Admitting: Speech Pathology

## 2019-08-21 ENCOUNTER — Other Ambulatory Visit: Payer: Self-pay

## 2019-08-21 ENCOUNTER — Encounter: Payer: Self-pay | Admitting: Speech Pathology

## 2019-08-21 DIAGNOSIS — R471 Dysarthria and anarthria: Secondary | ICD-10-CM | POA: Insufficient documentation

## 2019-08-21 NOTE — Patient Instructions (Signed)
    Practice daily conversation where you are talking extra slow to recalibrate your slow rate and get in the groove of talking slower    Encourage your wife and sister to slow their rate also during this practice time to help maintain your rhythm of a slower rate

## 2019-08-21 NOTE — Therapy (Signed)
[place] [address] [place], [place], [address] Phone: [phone]   Fax:  [phone]  Speech Language Pathology Treatment  Patient Details  Name: Ian [name] MRN: [id number] Date of Birth: 18-Sep-1961 Referring Provider (SLP): Alonza Bogus, DO   Encounter Date: 08/21/2019  End of Session - 08/21/19 1458    Visit Number  7    Number of Visits  17    SLP Start Time  9381    SLP Stop Time   0175    SLP Time Calculation (min)  43 min    Activity Tolerance  Patient tolerated treatment well       Past Medical History:  Diagnosis Date  . Anxiety   . History of kidney stones    passed  . PD (Parkinson's disease) ([place]) 09/10/2013  . PONV (postoperative nausea and vomiting)    nausea     Past Surgical History:  Procedure Laterality Date  . COLONOSCOPY    . MINOR PLACEMENT OF FIDUCIAL N/A 01/31/2017   Procedure: MINOR PLACEMENT OF FIDUCIAL;  Surgeon: Erline Levine, MD;  Location: [facility];  Service: Neurosurgery;  Laterality: N/A;  Fiducial placement  . none    . PULSE GENERATOR IMPLANT Right 02/17/2017   Procedure: Right implantable pulse generator placement;  Surgeon: Erline Levine, MD;  Location: [facility];  Service: Neurosurgery;  Laterality: Right;  Bilateral implantable pulse generator placement  . SUBTHALAMIC STIMULATOR BATTERY REPLACEMENT N/A 10/19/2018   Procedure: Deep Brain stimulator implantable pulse generator revision;  Surgeon: Erline Levine, MD;  Location: [place];  Service: Neurosurgery;  Laterality: N/A;  Deep Brain stimulator implantable pulse generator revision  . SUBTHALAMIC STIMULATOR INSERTION Bilateral 02/10/2017   Procedure: Bilateral Deep brain stimulator placement;  Surgeon: Erline Levine, MD;  Location: [facility];  Service: Neurosurgery;  Laterality: Bilateral;  Bilateral Deep brain stimulator placement  . UPPER GASTROINTESTINAL ENDOSCOPY      There were no vitals filed for this visit.  Subjective  Assessment - 08/21/19 1450    Subjective  "I'm adjusting my medications because I have had fatigue, so you will see how my speech is when my meds are off"    Currently in Pain?  No/denies            ADULT SLP TREATMENT - 08/21/19 1417      General Information   Behavior/Cognition  Alert;Cooperative;Pleasant mood      Treatment Provided   Treatment provided  Cognitive-Linquistic      Cognitive-Linquistic Treatment   Treatment focused on  Dysarthria    Skilled Treatment  Mildly increased rate upon entering speech room. Pt reports overall his speech and rate are improved, but are affected by his medication cycle.as well. He has not found an external reminder to  reduce his rate over the phone. I put a small sticker in the upper corner of his phone as a reminder, pt will  assess if this is helpful. With instruction and rare min verbal cues to "talk extral slow"  so that it feels awkward, pt maintained appropriate rate in conversation in clinic and while walking outside. Educated pt that I reduce my rate as a model when I am talking with him. Suggested he encourage his wife and sister to also reduce their rates of speech, especailly when pt is having difficulty reducing his rate.       Assessment / Recommendations / Plan   Plan  Continue with current plan of care      Progression  Toward Goals   Progression toward goals  Progressing toward goals       SLP Education - 08/21/19 1456    Education Details  Assess if sticker on phone is hepful, practice extra slow talking daily - let your wife or sister know you are going to do this    Person(s) Educated  Patient    Methods  Explanation;Demonstration;Verbal cues;Handout    Comprehension  Verbalized understanding;Returned demonstration;Verbal cues required       SLP Short Term Goals - 08/21/19 1457      SLP SHORT TERM GOAL #1   Title  pt will produce 19/20 sentences with reduced speech rate over 3 sessions    Baseline  07-25-19; 08/12/19     Status  Achieved      SLP SHORT TERM GOAL #2   Title  pt will demo slower speech producing >95% intelligible speech in 7 minutes simple conversation outside ST room    Status  Achieved       SLP Long Term Goals - 08/21/19 1458      SLP LONG TERM GOAL #1   Title  pt will demo slower speech producing >95% intelligible speech in 15 minutes simple to mod complex conversation outside ST room x3 sessions    Baseline  08/12/19; 08/21/19;    Time  5    Period  Weeks    Status  On-going   from "10 minutes" to "15 mintues"     SLP LONG TERM GOAL #2   Title  pt will report average no more than 2 requests asking him to repeat daily    Time  5    Period  Weeks    Status  On-going       Plan - 08/21/19 1457    Clinical Impression Statement  He continues to require min A to maintain rate of speech while multi tasking. He is also self correcting his speech with min A. Continue skilled ST to maximize carryover of compensations for dysarthria and intelligibility.    Speech Therapy Frequency  1x /week    Duration  --   8 weeks or 17 visits   Treatment/Interventions  Functional tasks;SLP instruction and feedback;Compensatory strategies;Internal/external aids;Patient/family education    Potential to Achieve Goals  Good       Patient will benefit from skilled therapeutic intervention in order to improve the following deficits and impairments:   Dysarthria and anarthria    Problem List Patient Active Problem List   Diagnosis Date Noted  . Nonallopathic lesion of sacral region 07/25/2018  . Nonallopathic lesion of lumbosacral region 07/25/2018  . 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 Acute bursitis of right shoulder 12/06/2017  . Antalgic gait 01/24/2017  . Intercostal muscle tear 07/19/2016  . PD (Parkinson's disease) (HCC) 06/24/2016  . 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 Nonallopathic lesion of thoracic region 01/25/2016  . Nonallopathic lesion-rib cage 01/25/2016  .  Nonallopathic lesion of cervical region 01/25/2016  . Parkinson's disease (HCC) 01/25/2016  . Lateral epicondylitis 12/01/2014  . REM behavioral disorder 08/27/2014  . Paralysis agitans (HCC) 10/18/2013    , Radene JourneyLaura Ann MS, CCC-SLP 08/21/2019, 2:59 PM  [place] [facility] [address] [place], [place], [address] Phone: [phone]   Fax:  [phone]   Name: Ian PurserKenneth [name] MRN: [id number] Date of Birth: 1961/04/15

## 2019-08-27 ENCOUNTER — Ambulatory Visit: Payer: Medicare Other

## 2019-08-27 ENCOUNTER — Other Ambulatory Visit: Payer: Self-pay

## 2019-08-27 DIAGNOSIS — R471 Dysarthria and anarthria: Secondary | ICD-10-CM | POA: Diagnosis not present

## 2019-08-27 NOTE — Therapy (Signed)
[facility] [address] [place], [place], [address] Phone: [phone]   Fax:  [phone]  Speech Language Pathology Treatment  Patient Details  Name: Ian PurserKenneth Kreiter MRN: [id number] Date of Birth: 1961/08/28 Referring Provider (SLP): [name], [name], DO   Encounter Date: 08/27/2019  End of Session - 08/27/19 1723    Visit Number  8    Number of Visits  17    Activity Tolerance  Patient tolerated treatment well       Past Medical History:  Diagnosis Date  . Anxiety   . History of kidney stones    passed  . PD (Parkinson's disease) (HCC) 09/10/2013  . PONV (postoperative nausea and vomiting)    nausea     Past Surgical History:  Procedure Laterality Date  . COLONOSCOPY    . MINOR PLACEMENT OF FIDUCIAL N/A 01/31/2017   Procedure: MINOR PLACEMENT OF FIDUCIAL;  Surgeon: [name], MD;  [facility];  Service: Neurosurgery;  Laterality: N/A;  Fiducial placement  . none    . PULSE GENERATOR IMPLANT Right 02/17/2017   Procedure: Right implantable pulse generator placement;  Surgeon: [name], MD;  [facility];  Service: Neurosurgery;  Laterality: Right;  Bilateral implantable pulse generator placement  . SUBTHALAMIC STIMULATOR BATTERY REPLACEMENT N/A 10/19/2018   Procedure: Deep Brain stimulator implantable pulse generator revision;  Surgeon: [name], [name], MD;  [facility];  Service: Neurosurgery;  Laterality: N/A;  Deep Brain stimulator implantable pulse generator revision  . SUBTHALAMIC STIMULATOR INSERTION Bilateral 02/10/2017   Procedure: Bilateral Deep brain stimulator placement;  Surgeon: [name], MD;  [facility];  Service: Neurosurgery;  Laterality: Bilateral;  Bilateral Deep brain stimulator placement  . UPPER GASTROINTESTINAL ENDOSCOPY      There were no vitals filed for this visit.  Subjective Assessment - 08/27/19 1505    Subjective  "I got my wife on board - we were doing the  super-slow talking on Friday."    Currently in Pain?  No/denies            ADULT SLP TREATMENT - 08/27/19 1523      General Information   Behavior/Cognition  Alert;Cooperative;Pleasant mood      Treatment Provided   Treatment provided  Cognitive-Linquistic      Cognitive-Linquistic Treatment   Treatment focused on  Dysarthria    Skilled Treatment  "It needs to become more of a habit than something I'm just practicing on." Pt to decr his next three weeks appointments to once/week. Pt with WNL/WFL rate when entering ST room, but when longer utterances, pt incr'd rate slightly. When SLP had pt review the session on Thursday with SLP pt's rate sped up rarely. Pt again mentioned the timing of his dosages; pt is within one hour of administration. SLP and pt engaged in cvonersation for 20 minutes and pt req'd rare cues for slowing rate down "extra slow". Pt mentioned his wife is a good cue for him when he does "have a lapse" and increases his rate. Pt set his own homework - to come up with another device to remind him to talk slower (external cue) and to practice slow speech on teh phone with his sister, and at home with his wife.      Assessment / Recommendations / Plan   Plan  Continue with current plan of care      Progression Toward Goals   Progression toward goals  Progressing toward goals  SLP Short Term Goals - 08/21/19 1457      SLP SHORT TERM GOAL #1   Title  pt will produce 19/20 sentences with reduced speech rate over 3 sessions    Baseline  07-25-19; 08/12/19    Status  Achieved      SLP SHORT TERM GOAL #2   Title  pt will demo slower speech producing >95% intelligible speech in 7 minutes simple conversation outside ST room    Status  Achieved       SLP Long Term Goals - 08/27/19 1727      SLP LONG TERM GOAL #1   Title  pt will demo slower speech producing >95% intelligible speech in 15 minutes simple to mod complex conversation outside [place] room x3 sessions     Baseline  08/12/19; 08/21/19;08-27-19    Status  Achieved   from "10 minutes" to "15 mintues"     SLP LONG TERM GOAL #2   Title  pt will report average no more than 2 requests asking him to repeat daily    Time  4    Period  Weeks    Status  On-going       Plan - 08/27/19 1723    Clinical Impression Statement  Pt continues to require min A occasoinally to maintain slower rate of speech while multi tasking. He is also self correcting his speech with min A, and this is improving. Frequency will decr to once/week. Continue skilled ST to maximize carryover of compensations for dysarthria and intelligibility.    Speech Therapy Frequency  1x /week    Duration  --   8 weeks or 17 visits   Treatment/Interventions  Functional tasks;SLP instruction and feedback;Compensatory strategies;Internal/external aids;Patient/family education    Potential to Achieve Goals  Good       Patient will benefit from skilled therapeutic intervention in order to improve the following deficits and impairments:   Dysarthria and anarthria    Problem List Patient Active Problem List   Diagnosis Date Noted  . Nonallopathic lesion of sacral region 07/25/2018  . Nonallopathic lesion of lumbosacral region 07/25/2018  . 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 Acute bursitis of right shoulder 12/06/2017  . Antalgic gait 01/24/2017  . Intercostal muscle tear 07/19/2016  . PD (Parkinson's disease) ([place]) 06/24/2016  . 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 Nonallopathic lesion of thoracic region 01/25/2016  . Nonallopathic lesion-rib cage 01/25/2016  . Nonallopathic lesion of cervical region 01/25/2016  . Parkinson's disease ([place]) 01/25/2016  . Lateral epicondylitis 12/01/2014  . REM behavioral disorder 08/27/2014  . Paralysis agitans ([place]) 10/18/2013    Rowland Ericsson ,[place], [place]  08/27/2019, 5:29 PM  [place] [address] [place], [place],  [address] Phone: [phone]   Fax:  [phone]   Name: Jeison Delpilar MRN: [id number] Date of Birth: [birth year]

## 2019-08-29 ENCOUNTER — Ambulatory Visit: Payer: Medicare Other

## 2019-09-02 ENCOUNTER — Ambulatory Visit: Payer: Medicare Other | Admitting: Speech Pathology

## 2019-09-03 ENCOUNTER — Telehealth: Payer: Self-pay

## 2019-09-03 NOTE — Telephone Encounter (Signed)
Received forms from patient from [facility] in mail box.  Place on provider desk for completion.  $25 form fee?

## 2019-09-03 NOTE — Telephone Encounter (Signed)
We have filled those out before.  They should be under media tab.  You can just copy what was written last year.

## 2019-09-03 NOTE — Telephone Encounter (Signed)
Yes old form attached, needing your signature Form complete

## 2019-09-04 ENCOUNTER — Ambulatory Visit: Payer: Medicare Other

## 2019-09-04 ENCOUNTER — Other Ambulatory Visit: Payer: Self-pay

## 2019-09-04 DIAGNOSIS — R471 Dysarthria and anarthria: Secondary | ICD-10-CM

## 2019-09-04 NOTE — Therapy (Signed)
[facility] [address] [place], [place], [address] Phone: [phone]   Fax:  [phone]  Speech Language Pathology Treatment  Patient Details  Name: Ian [name] MRN: [id number] Date of Birth: 1961-01-08 Referring Provider (SLP): Alonza Bogus, DO   Encounter Date: 09/04/2019  End of Session - 09/04/19 1605    Visit Number  9    Number of Visits  17    Date for SLP Re-Evaluation  09/13/19    SLP Start Time  75    SLP Stop Time   1445    SLP Time Calculation (min)  36 min    Activity Tolerance  Patient tolerated treatment well       Past Medical History:  Diagnosis Date  . Anxiety   . History of kidney stones    passed  . PD (Parkinson's disease) ([place]) 09/10/2013  . PONV (postoperative nausea and vomiting)    nausea     Past Surgical History:  Procedure Laterality Date  . COLONOSCOPY    . MINOR PLACEMENT OF FIDUCIAL N/A 01/31/2017   Procedure: MINOR PLACEMENT OF FIDUCIAL;  Surgeon: Erline Levine, MD;  Location: [facility];  Service: Neurosurgery;  Laterality: N/A;  Fiducial placement  . none    . PULSE GENERATOR IMPLANT Right 02/17/2017   Procedure: Right implantable pulse generator placement;  Surgeon: Erline Levine, MD;  Location: [facility];  Service: Neurosurgery;  Laterality: Right;  Bilateral implantable pulse generator placement  . SUBTHALAMIC STIMULATOR BATTERY REPLACEMENT N/A 10/19/2018   Procedure: Deep Brain stimulator implantable pulse generator revision;  Surgeon: Erline Levine, MD;  Location: [facility];  Service: Neurosurgery;  Laterality: N/A;  Deep Brain stimulator implantable pulse generator revision  . SUBTHALAMIC STIMULATOR INSERTION Bilateral 02/10/2017   Procedure: Bilateral Deep brain stimulator placement;  Surgeon: Erline Levine, MD;  Location: [facility];  Service: Neurosurgery;  Laterality: Bilateral;  Bilateral Deep brain stimulator placement  . UPPER GASTROINTESTINAL ENDOSCOPY      There were no vitals  filed for this visit.  Subjective Assessment - 09/04/19 1412    Subjective  Pt 7 minutes late.    Currently in Pain?  No/denies            ADULT SLP TREATMENT - 09/04/19 1413      General Information   Behavior/Cognition  Alert;Cooperative;Pleasant mood      Treatment Provided   Treatment provided  Cognitive-Linquistic      Cognitive-Linquistic Treatment   Treatment focused on  Dysarthria    Skilled Treatment  "I went to a meeting last night and nobody asked me to repeat or had difficulty understanding me." Pt read an article about football with approx 85% success with rate reduction. In simple short conversational snipets, pt focused on "feeling like it's too slow in order to make it normal speed" - pt's success was approx 80%.       Assessment / Recommendations / Plan   Plan  Continue with current plan of care      Progression Toward Goals   Progression toward goals  Progressing toward goals       SLP Education - 09/04/19 1605    Education Details  trying tapping may furhter assist pt in slowing rate    Person(s) Educated  Patient    Methods  Explanation    Comprehension  Verbalized understanding       SLP Short Term Goals - 08/21/19 1457      SLP SHORT TERM GOAL #1  Title  pt will produce 19/20 sentences with reduced speech rate over 3 sessions    Baseline  07-25-19; 08/12/19    Status  Achieved      SLP SHORT TERM GOAL #2   Title  pt will demo slower speech producing >95% intelligible speech in 7 minutes simple conversation outside ST room    Status  Achieved       SLP Long Term Goals - 09/04/19 1606      SLP LONG TERM GOAL #1   Title  pt will demo slower speech producing >95% intelligible speech in 15 minutes simple to mod complex conversation outside ST room x3 sessions    Baseline  08/12/19; 08/21/19;08-27-19    Status  Achieved   from "10 minutes" to "15 mintues"     SLP LONG TERM GOAL #2   Title  pt will report average no more than 2 requests asking  him to repeat daily    Time  3    Period  Weeks    Status  On-going       Plan - 09/04/19 1606    Clinical Impression Statement  Pt perforomed better today than previous session - required min A occasoinally to maintain slower rate of speech in conversation. He self corrects his speech with min A/modified independence. Pt is using external cues of a ring and sticker on his phone to slow his rate. Continue skilled ST to maximize carryover of compensations for dysarthria and intelligibility.    Speech Therapy Frequency  1x /week    Duration  --   8 weeks or 17 visits   Treatment/Interventions  Functional tasks;SLP instruction and feedback;Compensatory strategies;Internal/external aids;Patient/family education    Potential to Achieve Goals  Good       Patient will benefit from skilled therapeutic intervention in order to improve the following deficits and impairments:   Dysarthria and anarthria    Problem List Patient Active Problem List   Diagnosis Date Noted  . Nonallopathic lesion of sacral region 07/25/2018  . Nonallopathic lesion of lumbosacral region 07/25/2018  . 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 Acute bursitis of right shoulder 12/06/2017  . Antalgic gait 01/24/2017  . Intercostal muscle tear 07/19/2016  . PD (Parkinson's disease) ([facility]) 06/24/2016  . 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 Nonallopathic lesion of thoracic region 01/25/2016  . Nonallopathic lesion-rib cage 01/25/2016  . Nonallopathic lesion of cervical region 01/25/2016  . Parkinson's disease ([facility]) 01/25/2016  . Lateral epicondylitis 12/01/2014  . REM behavioral disorder 08/27/2014  . Paralysis agitans ([facility]) 10/18/2013    , ,MS, CCC-SLP  09/04/2019, 4:44 PM  [facility] [address] [place], [place], [address] Phone: [phone]   Fax:  [phone]   Name: Ian [name] MRN: [id number] Date of Birth: [birth year]

## 2019-09-05 NOTE — Telephone Encounter (Signed)
Called patient to make him aware that forms are complete and at front desk to pick up.   Also left message with the $25 form fee  Copy made for patient chart

## 2019-09-09 ENCOUNTER — Ambulatory Visit: Payer: Medicare Other | Admitting: Speech Pathology

## 2019-09-11 ENCOUNTER — Other Ambulatory Visit: Payer: Self-pay

## 2019-09-11 ENCOUNTER — Encounter: Payer: Self-pay | Admitting: Speech Pathology

## 2019-09-11 ENCOUNTER — Ambulatory Visit: Payer: Medicare Other | Admitting: Speech Pathology

## 2019-09-11 DIAGNOSIS — R471 Dysarthria and anarthria: Secondary | ICD-10-CM | POA: Diagnosis not present

## 2019-09-11 NOTE — Therapy (Signed)
[facility] [address] [place], [place], [address] Phone: [phone]   Fax:  [phone]  Speech Language Pathology Treatment  Patient Details  Name: Ian [name] MRN: [id number] Date of Birth: 04/19/1961 Referring Provider (SLP): Alonza Bogus, DO   Encounter Date: 09/11/2019   Dates of service: 07/15/19 to 09/11/19  End of Session - 09/11/19 1504    Visit Number  10    Number of Visits  17    Date for SLP Re-Evaluation  09/19/19    SLP Start Time  6063    SLP Stop Time   1445    SLP Time Calculation (min)  43 min       Past Medical History:  Diagnosis Date  . Anxiety   . History of kidney stones    passed  . PD (Parkinson's disease) ([place]) 09/10/2013  . PONV (postoperative nausea and vomiting)    nausea     Past Surgical History:  Procedure Laterality Date  . COLONOSCOPY    . MINOR PLACEMENT OF FIDUCIAL N/A 01/31/2017   Procedure: MINOR PLACEMENT OF FIDUCIAL;  Surgeon: Erline Levine, MD;  Location: [facility];  Service: Neurosurgery;  Laterality: N/A;  Fiducial placement  . none    . PULSE GENERATOR IMPLANT Right 02/17/2017   Procedure: Right implantable pulse generator placement;  Surgeon: Erline Levine, MD;  Location: [facility];  Service: Neurosurgery;  Laterality: Right;  Bilateral implantable pulse generator placement  . SUBTHALAMIC STIMULATOR BATTERY REPLACEMENT N/A 10/19/2018   Procedure: Deep Brain stimulator implantable pulse generator revision;  Surgeon: Erline Levine, MD;  Location: [facility];  Service: Neurosurgery;  Laterality: N/A;  Deep Brain stimulator implantable pulse generator revision  . SUBTHALAMIC STIMULATOR INSERTION Bilateral 02/10/2017   Procedure: Bilateral Deep brain stimulator placement;  Surgeon: Erline Levine, MD;  Location: [facility];  Service: Neurosurgery;  Laterality: Bilateral;  Bilateral Deep brain stimulator placement  . UPPER GASTROINTESTINAL ENDOSCOPY      There were no vitals filed for this  visit.  Subjective Assessment - 09/11/19 1411    Subjective  "I was going to practice with my sister over the phone, but she's been busy"    Currently in Pain?  No/denies            ADULT SLP TREATMENT - 09/11/19 1411      General Information   Behavior/Cognition  Alert;Cooperative;Pleasant mood      Cognitive-Linquistic Treatment   Treatment focused on  Dysarthria    Skilled Treatment  Abhi reports his wife asks him to repeat himself at least 10x a day. We discussed his wife not understanding him vs cueing him to slow down. Our goal is to be understood. Pt to educate spouse to say "I didn't understand you" vs slow down, so Haynes Bast can keep track of his intelligibility. Utilized tapping in conversation to slow rate of speech with rare min A. Educated pt that when he feels like he is talking really slowly, he is actually speaking at a normal rate. Elver acknowldged this and agreed. Targeted reduced rate while walking with mod I. Downgraded goal for request for repetition at home.       Assessment / Recommendations / Plan   Plan  Other (Comment)   modified last long term goal     Progression Toward Goals   Progression toward goals  Goals downgraded       SLP Education - 09/11/19 1457    Education Details  When you feel too slow, you  are talking at normal rate; have Mrs. Hufford use a signal for you to slow down. Goal is for less than 7 repeats a day by your spouse    Person(s) Educated  Patient    Methods  Explanation    Comprehension  Verbalized understanding;Returned demonstration;Verbal cues required       SLP Short Term Goals - 09/11/19 1500      SLP SHORT TERM GOAL #1   Title  pt will produce 19/20 sentences with reduced speech rate over 3 sessions    Baseline  07-25-19; 08/12/19    Status  Achieved      SLP SHORT TERM GOAL #2   Title  pt will demo slower speech producing >95% intelligible speech in 7 minutes simple conversation outside ST room    Status  Achieved        SLP Long Term Goals - 09/11/19 1500      SLP LONG TERM GOAL #1   Title  pt will demo slower speech producing >95% intelligible speech in 15 minutes simple to mod complex conversation outside ST room x3 sessions    Baseline  08/12/19; 08/21/19;08-27-19    Status  Achieved   from "10 minutes" to "15 mintues"     SLP LONG TERM GOAL #2   Title  pt will report average no more than 7 requests of wife asking him to repeat daily    Time  3    Period  Weeks    Status  Revised       Plan - 09/11/19 1459    Clinical Impression Statement  Pt continues to require min A occasoinally to maintain slower rate of speech while multi tasking. He is also self correcting his speech with min A, and this is improving. Modified goal for requests for repeats.. Continue skilled ST to maximize carryover of compensations for dysarthria and intelligibility.    Speech Therapy Frequency  1x /week    Duration  --   8 weeks or 17 visits   Treatment/Interventions  Functional tasks;SLP instruction and feedback;Compensatory strategies;Internal/external aids;Patient/family education    Potential to Achieve Goals  Good       Patient will benefit from skilled therapeutic intervention in order to improve the following deficits and impairments:   Dysarthria and anarthria    Problem List Patient Active Problem List   Diagnosis Date Noted  . Nonallopathic lesion of sacral region 07/25/2018  . Nonallopathic lesion of lumbosacral region 07/25/2018  . 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 Acute bursitis of right shoulder 12/06/2017  . Antalgic gait 01/24/2017  . Intercostal muscle tear 07/19/2016  . PD (Parkinson's disease) ([facility]) 06/24/2016  . 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 Nonallopathic lesion of thoracic region 01/25/2016  . Nonallopathic lesion-rib cage 01/25/2016  . Nonallopathic lesion of cervical region 01/25/2016  . Parkinson's disease ([facility]) 01/25/2016  . Lateral epicondylitis 12/01/2014  .  REM behavioral disorder 08/27/2014  . Paralysis agitans ([facility]CC) 10/18/2013    Monie Shere, Radene Journey  MS, CCC-SLP 09/11/2019, 3:05 PM  [facility] [address] [place], [place], [address] Phone: [phone]   Fax:  [phone]   Name: Ian [name] MRN: [id number] Date of Birth: 1961-04-07

## 2019-09-11 NOTE — Patient Instructions (Signed)
   Have your wife come up with a visual signal for you to slow down   You are tracking how many times she doesn't understand you face to face, not how many times she tells you to slow down. When she doesn't understand you, have her say "I didn't understand" so you are clear this isn't just a cue to slow down.  When you talk and feel awkward or too slow, you are speaking at a normal rate   Practice going extra extra slow at set times with your wife - practice taping also  Good idea to walk and talk on the phone if  Your body is wanting to move

## 2019-09-16 ENCOUNTER — Ambulatory Visit: Payer: Medicare Other | Admitting: Speech Pathology

## 2019-09-18 ENCOUNTER — Other Ambulatory Visit: Payer: Self-pay

## 2019-09-18 ENCOUNTER — Ambulatory Visit: Payer: Medicare Other

## 2019-09-18 DIAGNOSIS — R471 Dysarthria and anarthria: Secondary | ICD-10-CM

## 2019-09-18 NOTE — Therapy (Signed)
[facility] [address] [place], [place], [address] Phone: [phone]   Fax:  [phone]  Speech Language Pathology Treatment  Patient Details  Name: Ian [name] MRN: [id number] Date of Birth: 10-31-1961 Referring Provider (SLP): Kerin Salen, DO   Encounter Date: 09/18/2019  End of Session - 09/18/19 1656    Visit Number  11    Number of Visits  17    Date for SLP Re-Evaluation  09/19/19    SLP Start Time  1406    SLP Stop Time   1447    SLP Time Calculation (min)  41 min    Activity Tolerance  Patient tolerated treatment well       Past Medical History:  Diagnosis Date  . Anxiety   . History of kidney stones    passed  . PD (Parkinson's disease) ([facility]) 09/10/2013  . PONV (postoperative nausea and vomiting)    nausea     Past Surgical History:  Procedure Laterality Date  . COLONOSCOPY    . MINOR PLACEMENT OF FIDUCIAL N/A 01/31/2017   Procedure: MINOR PLACEMENT OF FIDUCIAL;  Surgeon: Maeola Harman, MD;  Location: [facility];  Service: Neurosurgery;  Laterality: N/A;  Fiducial placement  . none    . PULSE GENERATOR IMPLANT Right 02/17/2017   Procedure: Right implantable pulse generator placement;  Surgeon: Maeola Harman, MD;  Location: [facility];  Service: Neurosurgery;  Laterality: Right;  Bilateral implantable pulse generator placement  . SUBTHALAMIC STIMULATOR BATTERY REPLACEMENT N/A 10/19/2018   Procedure: Deep Brain stimulator implantable pulse generator revision;  Surgeon: Maeola Harman, MD;  Location: [facility];  Service: Neurosurgery;  Laterality: N/A;  Deep Brain stimulator implantable pulse generator revision  . SUBTHALAMIC STIMULATOR INSERTION Bilateral 02/10/2017   Procedure: Bilateral Deep brain stimulator placement;  Surgeon: Maeola Harman, MD;  Location: [facility];  Service: Neurosurgery;  Laterality: Bilateral;  Bilateral Deep brain stimulator placement  . UPPER GASTROINTESTINAL ENDOSCOPY      There were no  vitals filed for this visit.         ADULT SLP TREATMENT - 09/18/19 1420      General Information   Behavior/Cognition  Alert;Cooperative;Pleasant mood      Cognitive-Linquistic Treatment   Treatment focused on  Dysarthria    Skilled Treatment  "this time, for me, is about as bad as it gets." (pt, re: his speech). SLP used verbal cues to cue pt's slower rate Pt stated that when he becomes more comfortable his speech speeds up - SLP encouraged him to think about what tirggers the comfort-speed up so that he can combat the speed up. In approx 10 minutes conversation (mod complex) outside pt did excellet job at slowing rate.      Assessment / Recommendations / Plan   Plan  Other (Comment)   1-2 more visits     Progression Toward Goals   Progression toward goals  Progressing toward goals         SLP Short Term Goals - 09/11/19 1500      SLP SHORT TERM GOAL #1   Title  pt will produce 19/20 sentences with reduced speech rate over 3 sessions    Baseline  07-25-19; 08/12/19    Status  Achieved      SLP SHORT TERM GOAL #2   Title  pt will demo slower speech producing >95% intelligible speech in 7 minutes simple conversation outside ST room    Status  Achieved  SLP Long Term Goals - 09/18/19 1658      SLP LONG TERM GOAL #1   Title  pt will demo slower speech producing >95% intelligible speech in 15 minutes simple to mod complex conversation outside [place] room x3 sessions    Baseline  08/12/19; 08/21/19;08-27-19    Status  Achieved   from "10 minutes" to "15 mintues"     SLP LONG TERM GOAL #2   Title  pt will report average no more than 7 requests of wife asking him to repeat daily    Time  3    Period  Weeks    Status  On-going       Plan - 09/18/19 1657    Clinical Impression Statement  Pt continues to require min A occasoinally to maintain slower rate of speech while multi tasking. Pt self corrected speech rate x3/4 today .See "skiled intervention" for details. Continue  skilled ST to maximize carryover of compensations for dysarthria and intelligibility.    Speech Therapy Frequency  1x /week    Duration  --   8 weeks or 17 visits   Treatment/Interventions  Functional tasks;SLP instruction and feedback;Compensatory strategies;Internal/external aids;Patient/family education    Potential to Achieve Goals  Good       Patient will benefit from skilled therapeutic intervention in order to improve the following deficits and impairments:   Dysarthria and anarthria    Problem List Patient Active Problem List   Diagnosis Date Noted  . Nonallopathic lesion of sacral region 07/25/2018  . Nonallopathic lesion of lumbosacral region 07/25/2018  . 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 Acute bursitis of right shoulder 12/06/2017  . Antalgic gait 01/24/2017  . Intercostal muscle tear 07/19/2016  . PD (Parkinson's disease) ([place]) 06/24/2016  . 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 Nonallopathic lesion of thoracic region 01/25/2016  . Nonallopathic lesion-rib cage 01/25/2016  . Nonallopathic lesion of cervical region 01/25/2016  . Parkinson's disease ([place]) 01/25/2016  . Lateral epicondylitis 12/01/2014  . REM behavioral disorder 08/27/2014  . Paralysis agitans ([place]) 10/18/2013    [facility] ,[place], [place]  09/18/2019, 4:58 PM  [facility] [address] [place], [place], [address] Phone: [phone]   Fax:  [phone]   Name: Ian [name] MRN: [id number] Date of Birth: 1961-01-29

## 2019-10-02 ENCOUNTER — Ambulatory Visit: Payer: Medicare Other

## 2019-10-10 ENCOUNTER — Telehealth: Payer: Self-pay | Admitting: *Deleted

## 2019-10-10 ENCOUNTER — Encounter: Payer: Self-pay | Admitting: *Deleted

## 2019-10-10 DIAGNOSIS — Z0279 Encounter for issue of other medical certificate: Secondary | ICD-10-CM

## 2019-10-10 NOTE — Progress Notes (Signed)
Continued disability forms from [employer] have been signed by Dr. Carles Collet and patient made aware they are at the front desk for pick up. He is aware of the $25 form fee. Copy sent to medical records also.

## 2019-10-10 NOTE — Telephone Encounter (Addendum)
Patient mailed in continued disability forms to be completed. Dr. Carles Collet in office tomorrow and will sign. Left message for patient if he wanted them mailed to him or faxed to company. No fax number on form but there is a phone number. Asked if he would please call back to our front desk and let us know his preference on where to send the form when completed tomorrow.  This is for [employer].

## 2019-10-14 ENCOUNTER — Ambulatory Visit: Payer: Medicare Other | Attending: Neurology | Admitting: Speech Pathology

## 2019-10-14 ENCOUNTER — Other Ambulatory Visit: Payer: Self-pay

## 2019-10-14 ENCOUNTER — Encounter: Payer: Self-pay | Admitting: Speech Pathology

## 2019-10-14 DIAGNOSIS — R471 Dysarthria and anarthria: Secondary | ICD-10-CM | POA: Diagnosis present

## 2019-10-14 NOTE — Patient Instructions (Signed)
  Keep up the good work monitoring when you are too fast vs your not being understood - continue tracking this - have your wife use a non verbal signal if able, so you are not interrputed  If you are having a time where you are not being understood - take it way slow - 1 word per second for 8-10 utterances to recalibrate your rate of speech  Use tapping if you need it - it does slow you down  Thank your wife for using a slow rate to support you and help you reduce your rate of speech  Notice any tension in your neck, throat shoulders and try to relax those areas as they affect your speech and voice

## 2019-10-14 NOTE — Therapy (Addendum)
[facility] [address] [place], [place], [address] Phone: [phone]   Fax:  [phone]  Speech Language Pathology Treatment & Discharge Summary  Patient Details  Name: Ian [name] MRN: [id number] Date of Birth: September 16, 1961 Referring Provider (SLP): Alonza Bogus, DO   Encounter Date: 10/14/2019  End of Session - 10/14/19 1447    Visit Number  12    Number of Visits  17    Date for SLP Re-Evaluation  10/21/19    SLP Start Time  58    SLP Stop Time   1440    SLP Time Calculation (min)  31 min    Activity Tolerance  Patient tolerated treatment well       Past Medical History:  Diagnosis Date  . Anxiety   . History of kidney stones    passed  . PD (Parkinson's disease) ([place]) 09/10/2013  . PONV (postoperative nausea and vomiting)    nausea     Past Surgical History:  Procedure Laterality Date  . COLONOSCOPY    . MINOR PLACEMENT OF FIDUCIAL N/A 01/31/2017   Procedure: MINOR PLACEMENT OF FIDUCIAL;  Surgeon: Erline Levine, MD;  Location: [place] [facility];  Service: Neurosurgery;  Laterality: N/A;  Fiducial placement  . none    . PULSE GENERATOR IMPLANT Right 02/17/2017   Procedure: Right implantable pulse generator placement;  Surgeon: Erline Levine, MD;  Location: [place];  Service: Neurosurgery;  Laterality: Right;  Bilateral implantable pulse generator placement  . SUBTHALAMIC STIMULATOR BATTERY REPLACEMENT N/A 10/19/2018   Procedure: Deep Brain stimulator implantable pulse generator revision;  Surgeon: Erline Levine, MD;  Location: [place];  Service: Neurosurgery;  Laterality: N/A;  Deep Brain stimulator implantable pulse generator revision  . SUBTHALAMIC STIMULATOR INSERTION Bilateral 02/10/2017   Procedure: Bilateral Deep brain stimulator placement;  Surgeon: Erline Levine, MD;  Location: [place];  Service: Neurosurgery;  Laterality: Bilateral;  Bilateral Deep brain stimulator placement  . UPPER GASTROINTESTINAL ENDOSCOPY       There were no vitals filed for this visit.  Subjective Assessment - 10/14/19 1449    Subjective  "I was doing good with my speech, and then I had a lot of stress the last 2 weeks" Pt arrived 10 min late due to traffic            ADULT SLP TREATMENT - 10/14/19 1418      General Information   Behavior/Cognition  Alert;Cooperative;Pleasant mood      Treatment Provided   Treatment provided  Cognitive-Linquistic      Cognitive-Linquistic Treatment   Treatment focused on  Dysarthria    Skilled Treatment  Pt reports his prior goal was to have his wife not understand him 5x, he states he was reaching this goal, then has had some stressful events and his wife has not understood him 8x on average this past week.       Assessment / Recommendations / Plan   Plan  Discharge SLP treatment due to (comment)      Progression Toward Goals   Progression toward goals  Goals met, education completed, patient discharged from [facility] - 10/14/19 1442    Education Details  continue to track times you are not understood, when you are having difficulty reducing your rate, talk at 1 word per second to recalibrate your rate    Person(s) Educated  Patient    Methods  Explanation;Demonstration;Handout    Comprehension  Verbalized understanding;Returned demonstration  SPEECH THERAPY DISCHARGE SUMMARY  Visits from Start of Care: 12  Current functional level related to goals / functional outcomes: See goals below   Remaining deficits: Dysarthria   Education / Equipment: Compensations for dysarthria; HEP for dysarthria Plan: Patient agrees to discharge.  Patient goals were met. Patient is being discharged due to meeting the stated rehab goals.  ?????         SLP Short Term Goals - 10/14/19 1445      SLP SHORT TERM GOAL #1   Title  pt will produce 19/20 sentences with reduced speech rate over 3 sessions    Baseline  07-25-19; 08/12/19    Status  Achieved      SLP  SHORT TERM GOAL #2   Title  pt will demo slower speech producing >95% intelligible speech in 7 minutes simple conversation outside ST room    Status  Achieved       SLP Long Term Goals - 10/14/19 [place] #1   Title  pt will demo slower speech producing >95% intelligible speech in 15 minutes simple to mod complex conversation outside [place] room x3 sessions    Baseline  08/12/19; 08/21/19;08-27-19    Status  Achieved   from "10 minutes" to "15 mintues"     SLP LONG TERM GOAL #2   Title  pt will report average no more than 7 requests of wife asking him to repeat daily    Baseline  pt was down to 5x a day, now up to 8 times a day due to recent stress    Time  3    Period  Weeks    Status  Achieved       Plan - 10/14/19 1443    Clinical Impression Statement  Pt continues to require min A occasoinally to maintain slower rate of speech while multi tasking.He reports he is able to self correct his rate when his wife cues him to most of the tiem.  Pt self corrected speech rate x3/4 today .See "skiled intervention" for details. Discharge ST at this time, pt met goals, is pleased with current level and is carrying over compensatory strategies at home.    Speech Therapy Frequency  2x / week    Duration  --   8 weeks or 17 visits   Treatment/Interventions  Functional tasks;SLP instruction and feedback;Compensatory strategies;Internal/external aids;Patient/family education    Potential to Achieve Goals  Good       Patient will benefit from skilled therapeutic intervention in order to improve the following deficits and impairments:   Dysarthria and anarthria    Problem List Patient Active Problem List   Diagnosis Date Noted  . Nonallopathic lesion of sacral region 07/25/2018  . Nonallopathic lesion of lumbosacral region 07/25/2018  . 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 Acute bursitis of right shoulder 12/06/2017  . Antalgic gait 01/24/2017  . Intercostal muscle  tear 07/19/2016  . PD (Parkinson's disease) ([facility]) 06/24/2016  . 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 Nonallopathic lesion of thoracic region 01/25/2016  . Nonallopathic lesion-rib cage 01/25/2016  . Nonallopathic lesion of cervical region 01/25/2016  . Parkinson's disease ([facility]) 01/25/2016  . Lateral epicondylitis 12/01/2014  . REM behavioral disorder 08/27/2014  . Paralysis agitans ([place]) 10/18/2013    Amilah Greenspan, Annye Rusk MS, CCC-SLP 10/14/2019, 2:50 PM  [facility] [address] [place], [place], [address] Phone: [phone]   Fax:  [phone]  Name: Ian [name] MRN: [id number] Date of Birth: 09-09-1961

## 2019-11-12 NOTE — Progress Notes (Addendum)
Ian [name] was seen today in follow up for Parkinsons disease.  Overall, the patient feels that he is doing well, but he is off medication right now.   Pt denies falls.  Pt denies lightheadedness, near syncope.  No hallucinations.  Mood has been good.  Fatigue continues to be an issue.  I must have misunderstood him in the past, and thought he was on [name] 145 mg, 1 tablet 5 times per day, but today he tells me that he is on [name] 145 mg, 2 tablets 4 times per day, the last being at bedtime.  He is not sure if he needs the bedtime dosage or not.  He does have some dyskinesia with the medication, so he actually turned down his device a little bit.  He was not sure if it helped or not.  He does well with clonazepam at bedtime.  Current prescribed movement disorder medications: [name] 145 mg, 2 tablets 4 times per day Clonazepam 0.5 mg, half tablet at night   PREVIOUS MEDICATIONS:  Requip; Neupro (tried only a few days); propranolol helped tremor some; Inbrija (samples given but not taken); Provigil; Nuvigil; selegiline (no help for fatigue)  ALLERGIES:  No Known Allergies  CURRENT MEDICATIONS:  Outpatient Encounter Medications as of 11/18/2019  Medication Sig  . Armodafinil 150 MG tablet Take by mouth.  . Ascorbic Acid (VITAMIN C PO) Take 3 tablets by mouth 3 (three) times daily.   [name] b complex vitamins tablet Take 1 tablet by mouth 2 (two) times daily.  . Carbidopa-Levodopa ER ([name]) 36.25-145 MG CPCR Take 2 capsules by mouth 4 (four) times daily.  . clonazePAM (KLONOPIN) 0.5 MG tablet Take 0.5 tablets (0.25 mg total) by mouth at bedtime.  [name] ibuprofen (ADVIL,MOTRIN) 200 MG tablet Take 400 mg by mouth every 6 (six) hours as needed for headache or moderate pain.  [name] LECITHIN PO Take 1 capsule by mouth 2 (two) times daily.   . Levodopa (INBRIJA) 42 MG CAPS Place 2 capsules into inhaler and inhale 5 (five) times daily as needed.  . Melatonin 3 MG CAPS Take 3 mg by mouth at bedtime.   No  facility-administered encounter medications on file as of 11/18/2019.     PAST MEDICAL HISTORY:   Past Medical History:  Diagnosis Date  . Anxiety   . History of kidney stones    passed  . PD (Parkinson's disease) ([facility]) 09/10/2013  . PONV (postoperative nausea and vomiting)    nausea     PAST SURGICAL HISTORY:   Past Surgical History:  Procedure Laterality Date  . COLONOSCOPY    . MINOR PLACEMENT OF FIDUCIAL N/A 01/31/2017   Procedure: MINOR PLACEMENT OF FIDUCIAL;  Surgeon: Erline Levine, MD;  Location: [place];  Service: Neurosurgery;  Laterality: N/A;  Fiducial placement  . none    . PULSE GENERATOR IMPLANT Right 02/17/2017   Procedure: Right implantable pulse generator placement;  Surgeon: Erline Levine, MD;  Location: [facility];  Service: Neurosurgery;  Laterality: Right;  Bilateral implantable pulse generator placement  . SUBTHALAMIC STIMULATOR BATTERY REPLACEMENT N/A 10/19/2018   Procedure: Deep Brain stimulator implantable pulse generator revision;  Surgeon: Erline Levine, MD;  Location: [facility];  Service: Neurosurgery;  Laterality: N/A;  Deep Brain stimulator implantable pulse generator revision  . SUBTHALAMIC STIMULATOR INSERTION Bilateral 02/10/2017   Procedure: Bilateral Deep brain stimulator placement;  Surgeon: Erline Levine, MD;  Location: [facility];  Service: Neurosurgery;  Laterality: Bilateral;  Bilateral Deep brain stimulator placement  .  UPPER GASTROINTESTINAL ENDOSCOPY      SOCIAL HISTORY:   Social History   Socioeconomic History  . Marital status: Married    Spouse name: Not on file  . Number of children: 3  . Years of education: Not on file  . Highest education level: Master's degree (e.g., MA, MS, MEng, MEd, MSW, MBA)  Occupational History  . Not on file  Social Needs  . Financial resource strain: Not on file  . Food insecurity    Worry: Not on file    Inability: Not on file  . Transportation needs    Medical: Not on file    Non-medical: Not on file  Tobacco Use   . Smoking status: Never Smoker  . Smokeless tobacco: Never Used  Substance and Sexual Activity  . Alcohol use: Yes    Alcohol/week: 1.0 standard drinks    Types: 1 Cans of beer per week    Comment: Occ  . Drug use: No  . Sexual activity: Not on file  Lifestyle  . Physical activity    Days per week: Not on file    Minutes per session: Not on file  . Stress: Not on file  Relationships  . Social Musician on phone: Not on file    Gets together: Not on file    Attends religious service: Not on file    Active member of club or organization: Not on file    Attends meetings of clubs or organizations: Not on file    Relationship status: Not on file  . Intimate partner violence    Fear of current or ex partner: Not on file    Emotionally abused: Not on file    Physically abused: Not on file    Forced sexual activity: Not on file  Other Topics Concern  . Not on file  Social History Narrative  . Not on file    FAMILY HISTORY:   Family Status  Relation Name Status  . Mother  Deceased at age 35       Breast Cancer  . Father  Alive       Diabetes  . Sister  Alive       healthy  . Daughter X2 TWINS Alive       2, twins  . Son  Alive  . Other  (Not Specified)    ROS:  Review of Systems  Constitutional: Positive for malaise/fatigue.  HENT: Negative.   Eyes: Negative.   Respiratory: Negative.   Cardiovascular: Negative.   Genitourinary: Negative.   Musculoskeletal: Negative.   Skin: Negative.     PHYSICAL EXAMINATION:    VITALS:   Vitals:   11/18/19 1304  BP: 124/77  Pulse: 100  Resp: 16  SpO2: 99%  Weight: 166 lb 6.4 oz (75.5 kg)  Height: 6\' 2"  (1.88 m)    GEN:  The patient appears stated age and is in NAD. HEENT:  Normocephalic, atraumatic.  The mucous membranes are moist. The superficial temporal arteries are without ropiness or tenderness. CV:  RRR Lungs:  CTAB Neck/HEME:  There are no carotid bruits bilaterally.  Neurological examination:   Orientation: The patient is alert and oriented x3. Cranial nerves: There is good facial symmetry with mild facial hypomimia. The speech is fluent and clear.  He is hypophonic.  Mildly dysarthric.  Soft palate rises symmetrically and there is no tongue deviation. Hearing is intact to conversational tone. Sensation: Sensation is intact to light touch throughout Motor: Strength  is at least antigravity x4.  Movement examination: Tone: There is normal tone in the UE/LE Abnormal movements: there is tremor in the bilateral LE with distraction (pt is off of medications) Coordination:  There is no significant decremation with RAM's Gait and Station: The patient easily arises out of the chair.  He walks well.   ASSESSMENT/PLAN:  1. idiopathic Parkinson's disease.  The patient has tremor, bradykinesia, rigidity and mild postural instability.             -The patient underwent bilateral STN DBS with [facility] device on 02/10/2017 and had IPG placement on 02/17/2017.    He did have his battery changed on October 19, 2018 to an MRI compatible battery.             -He is currently on [name] 145 mg, 2 tablets 4 times per day.  I am going to have him stop that.  Samples were given of the 95 mg.  He can try that, 2 tablets 4 times per day.  I am trying to see if that will help the dyskinesia, and still allow him to move properly.  Dyskinesias likely related to medication and not the device since dyskinesia happens when he takes his medicine.  -Forms filled out today stating that he is competent to manage his finances. 2.  Fatigue              -Ultimately, this is likely related to the disease, but I do not think that there are any medications that are going to help.  We have tried Provigil, Nuvigil and selegiline, all without relief.  He is exercising. 3.  REM behavior disorder.             -Better with clonazepam 0.5 mg, half a tablet at night.  He will let me know when he needs a refill. 4.  I will  plan on seeing the patient back in the next 6 months, sooner should new neurologic issues arise.  Cc:  Patient, [name]

## 2019-11-18 ENCOUNTER — Other Ambulatory Visit: Payer: Self-pay

## 2019-11-18 ENCOUNTER — Ambulatory Visit (INDEPENDENT_AMBULATORY_CARE_PROVIDER_SITE_OTHER): Payer: Medicare Other | Admitting: Neurology

## 2019-11-18 ENCOUNTER — Encounter: Payer: Self-pay | Admitting: Neurology

## 2019-11-18 VITALS — BP 124/77 | HR 100 | Resp 16 | Ht 74.0 in | Wt 166.4 lb

## 2019-11-18 DIAGNOSIS — G4752 REM sleep behavior disorder: Secondary | ICD-10-CM

## 2019-11-18 DIAGNOSIS — G20 Parkinson's disease: Secondary | ICD-10-CM

## 2019-11-18 DIAGNOSIS — G249 Dystonia, unspecified: Secondary | ICD-10-CM

## 2019-11-18 MED ORDER — RYTARY 23.75-95 MG PO CPCR: 1.0000 | ORAL_CAPSULE | Freq: Every day | ORAL | 0 refills | Status: DC

## 2019-11-18 NOTE — Patient Instructions (Signed)
Stop rytary 145 Take rytary 95 mg, 2 tablets 4 times per day.  Let me know how you do with this.  The physicians and staff at [facility] are committed to providing excellent care. You may receive a survey requesting feedback about your experience at our office. We strive to receive "very good" responses to the survey questions. If you feel that your experience would prevent you from giving the office a "very good " response, please contact our office to try to remedy the situation. We may be reached at [phone]. Thank you for taking the time out of your busy day to complete the survey.

## 2019-11-18 NOTE — Procedures (Signed)
DBS Programming was performed.    Manufacturer of DBS device: [employer]  Total time spent programming was 10 minutes.  Device was confirmed to be on.  Soft start was confirmed to be on.  Impedences were checked and were within normal limits.  Battery was checked and was determined to be functioning normally and not near the end of life.  Final settings were as follows:  Left brain electrode:     3-(90%)4-(10%)C+           ; Amplitude  3.2   mA   ; Pulse width 60 microseconds;   Frequency   130   Hz.  Right brain electrode:     2-(30%)3-(70%)C+          ; Amplitude   2.4  mA ;  Pulse width 60  microseconds;  Frequency   130    Hz.

## 2019-12-04 MED ORDER — RYTARY 23.75-95 MG PO CPCR: 2.0000 | ORAL_CAPSULE | Freq: Four times a day (QID) | ORAL | 1 refills | Status: DC

## 2019-12-19 ENCOUNTER — Encounter: Payer: Self-pay | Admitting: Neurology

## 2020-02-17 ENCOUNTER — Other Ambulatory Visit: Payer: Self-pay

## 2020-02-18 MED ORDER — CLONAZEPAM 0.5 MG PO TABS: 0.2500 mg | ORAL_TABLET | Freq: Every day | ORAL | 1 refills | Status: DC

## 2020-04-14 ENCOUNTER — Other Ambulatory Visit: Payer: Self-pay

## 2020-04-14 ENCOUNTER — Ambulatory Visit: Payer: Medicare Other | Admitting: Physical Therapy

## 2020-04-14 ENCOUNTER — Ambulatory Visit: Payer: Medicare Other

## 2020-04-14 ENCOUNTER — Ambulatory Visit: Payer: Medicare Other | Attending: Neurology | Admitting: Occupational Therapy

## 2020-04-14 DIAGNOSIS — R471 Dysarthria and anarthria: Secondary | ICD-10-CM

## 2020-04-14 DIAGNOSIS — R2689 Other abnormalities of gait and mobility: Secondary | ICD-10-CM | POA: Insufficient documentation

## 2020-04-14 DIAGNOSIS — R29818 Other symptoms and signs involving the nervous system: Secondary | ICD-10-CM

## 2020-04-14 DIAGNOSIS — R278 Other lack of coordination: Secondary | ICD-10-CM | POA: Insufficient documentation

## 2020-04-14 NOTE — Therapy (Signed)
[facility] [address] [place], [place], [address] Phone: [phone]   Fax:  [phone]  Patient Details  Name: Ian [name] MRN: [id number] Date of Birth: [birth year] Referring Provider:  Vladimir Faster, DO  Encounter Date: 04/14/2020  Speech Therapy Parkinson's Disease Screen   Decibel Level today: upper 60s-low 70s dB  (WNL=70-72 dB) with sound level meter 30cm away from pt's mouth, masked. Pt's conversational volume has remained essentially the same since last treatment course. However, pt's intelligibility is significantly decreased today due to rushing of speech typical of that of patients with DBS placed. SLP engaged in careful listening consistently today to comprehend pt's message. SLP found that facing pt was very helpful with understanding his speech.   Pt does not report difficulty in swallowing warranting further evaluation. SLP to monitor.  Pt would benefit from speech-language eval for dysarthria  - please order via EPIC or call [phone] to schedule     [facility] ,MS, CCC-SLP  04/14/2020, 5:18 PM  [facility] [address] [place], [place], [address] Phone: [phone]   Fax:  [phone]

## 2020-04-14 NOTE — Therapy (Signed)
[facility] [address] [place], [place], [address] Phone: [phone]   Fax:  [phone]  Patient Details  Name: Ian [name] MRN: [id number] Date of Birth: 02-01-1961 Referring Provider:  Vladimir Faster, DO  Encounter Date: 04/14/2020   Occupational Therapy Parkinson's Disease Screen  Hand dominance:  right   Physical Performance Test item #4 (donning/doffing jacket):  8.06 sec  (feet close together)  Fastening/unfastening 3 buttons in:  21.12sec  9-hole peg test:    RUE  28.31 sec        LUE  28.44 sec  Box & Blocks Test:   RUE  57 blocks        LUE  53 blocks  Change in ability to perform ADLs/IADLs:  No  Other Comments:  Recommended pt stand with feet apart more with activities like donning jacket for better balance due to observations of dyskinesias that may affect balance with feet close together.  Pt reports that this is his worst time of day.   Pt does not require occupational therapy services at this time.  Recommended occupational therapy screen in   6-9 months.   [facility] 04/14/2020, 1:35 PM  [facility] [address] [place], [place], [address] Phone: [phone]   Fax:  [phone]   Willa Frater, OTR/L [facility] [address] [place], [place]  [address] [phone] phone [phone] 04/14/20 1:36 PM

## 2020-04-14 NOTE — Therapy (Signed)
[facility] [address] [place], [place], [address] Phone: [phone]   Fax:  [phone]  Patient Details  Name: Belmont Valli MRN: [id number] Date of Birth: 03-May-1961 Referring Provider:  Vladimir Faster, DO  Encounter Date: 04/14/2020  Physical Therapy Parkinson's Disease Screen   Timed Up and Go test: 9.47 sec  10 meter walk test: 8.65 sec (3.89 ft/sec)  5 time sit to stand test: 9.41 sec     Patient does not require Physical Therapy services at this time.  Recommend Physical Therapy screen in 6-9 months.     [place] Ramone W. 04/14/2020, 1:19 PM  Lonia Blood, PT 04/14/20 1:28 PM Phone: [phone] Fax: [phone]    [facility] [address] [place], [place], [address] Phone: [phone]   Fax:  [phone]

## 2020-04-15 ENCOUNTER — Other Ambulatory Visit: Payer: Self-pay | Admitting: Neurology

## 2020-04-15 DIAGNOSIS — G20 Parkinson's disease: Secondary | ICD-10-CM

## 2020-04-17 NOTE — Progress Notes (Signed)
Assessment/Plan:   1.  Parkinsons Disease -The patient underwent bilateral STN DBS with [employer] device on 02/10/2017 and had IPG placement on 02/17/2017.   He did have his battery changed on October 19, 2018 to an MRI compatible battery. -Continue [name], 95 mg, 2 tablets 4 times per day 2. Fatigue  -Ultimately, this is likely related to the disease, but I do not think that there are any medications that are going to help. We have tried Provigil, Nuvigil and selegiline, all without relief. He is exercising. 3. REM behavior disorder. -Better with clonazepam 0.5 mg, half a tablet at night. He will let me know when he needs a refill.   Subjective:   Ian [name] was seen today in follow up for Parkinsons disease.  My previous records were reviewed prior to todays visit as well as outside records available to me. Pt denies falls.  Pt denies lightheadedness, near syncope.  No hallucinations.  Mood has been good. Still with fatigue.  Overall doing well and about the same as previous, even on lower dose of rytary. Patient just had therapy evaluations.  Those have been reviewed.  Only speech therapy was recommended.  Order was sent.  Current prescribed movement disorder medications: [name] 95 mg, 2 tablets 4 times per day (changed from 145 mg, 2 tablets 4 times per day last visit) Clonazepam 0.5 mg, half tablet at night   PREVIOUS MEDICATIONS:  Requip; Neupro (tried only a few days); propranolol helped tremor some; Inbrija (samples given but not taken); Provigil; Nuvigil; selegiline (no help for fatigue)  ALLERGIES:  No Known Allergies  CURRENT MEDICATIONS:  Outpatient Encounter Medications as of 04/20/2020  Medication Sig  . Ascorbic Acid (VITAMIN C PO) Take 3 tablets by mouth 3 (three) times daily.   [name] b complex vitamins tablet Take 1 tablet by mouth 2 (two) times daily.  . Carbidopa-Levodopa ER ([name]) 23.75-95 MG CPCR  Take 2 capsules by mouth 4 (four) times daily.  . clonazePAM (KLONOPIN) 0.5 MG tablet Take 0.5 tablets (0.25 mg total) by mouth at bedtime.  [name] ibuprofen (ADVIL,MOTRIN) 200 MG tablet Take 400 mg by mouth every 6 (six) hours as needed for headache or moderate pain.  . Melatonin 3 MG CAPS Take 3 mg by mouth at bedtime.  . [DISCONTINUED] Armodafinil 150 MG tablet Take by mouth.  . [DISCONTINUED] LECITHIN PO Take 1 capsule by mouth 2 (two) times daily.   . [DISCONTINUED] Levodopa (INBRIJA) 42 MG CAPS Place 2 capsules into inhaler and inhale 5 (five) times daily as needed. (Patient not taking: Reported on 04/20/2020)   No facility-administered encounter medications on file as of 04/20/2020.    Objective:   PHYSICAL EXAMINATION:    VITALS:   Vitals:   04/20/20 1013  BP: 105/65  Pulse: 85  SpO2: 100%  Weight: 166 lb (75.3 kg)  Height: 6\' 2"  (1.88 m)    GEN:  The patient appears stated age and is in NAD. HEENT:  Normocephalic, atraumatic.   Neurological examination:  Orientation: The patient is alert and oriented x3. Cranial nerves: There is good facial symmetry with no facial hypomimia. The speech is fluent and clear. Soft palate rises symmetrically and there is no tongue deviation. Hearing is intact to conversational tone. Sensation: Sensation is intact to light touch throughout Motor: Strength is at least antigravity x4.  Movement examination: Tone: There is normal tone in the UE/LE Abnormal movements: rare dyskinesia in the R shoulder Coordination:  There is no decremation with  RAM's Gait and Station: The patient has no difficulty arising out of a deep-seated chair without the use of the hands. The patient's stride length is good with good arm swing bilaterally.   I have reviewed and interpreted the following labs independently    Chemistry      Component Value Date/Time   NA 141 10/15/2018 1533   K 4.4 10/15/2018 1533   CL 105 10/15/2018 1533   CO2 28 10/15/2018 1533   BUN 16  10/15/2018 1533   CREATININE 1.05 10/15/2018 1533   CREATININE 0.91 11/25/2014 1051      Component Value Date/Time   CALCIUM 9.7 10/15/2018 1533   ALKPHOS 69 06/10/2015 1155   AST 27 06/10/2015 1155   ALT 40 06/10/2015 1155   BILITOT 1.0 06/10/2015 1155       Lab Results  Component Value Date   WBC 6.5 10/15/2018   HGB 15.4 10/15/2018   HCT 48.2 10/15/2018   MCV 91.6 10/15/2018   PLT 302 10/15/2018    No results found for: TSH   Total time spent on today's visit was 25 minutes, including both face-to-face time and nonface-to-face time.  Time included that spent on review of records (prior notes available to me/labs/imaging if pertinent), discussing treatment and goals, answering patient's questions and coordinating care.  Cc:  Bernerd Limbo, MD

## 2020-04-20 ENCOUNTER — Ambulatory Visit (INDEPENDENT_AMBULATORY_CARE_PROVIDER_SITE_OTHER): Payer: Medicare Other | Admitting: Neurology

## 2020-04-20 ENCOUNTER — Encounter: Payer: Self-pay | Admitting: Neurology

## 2020-04-20 ENCOUNTER — Other Ambulatory Visit: Payer: Self-pay

## 2020-04-20 DIAGNOSIS — G20 Parkinson's disease: Secondary | ICD-10-CM

## 2020-04-20 NOTE — Procedures (Signed)
DBS Programming was performed.    Manufacturer of DBS device: [employer]  Total time spent programming was 10 minutes.  Device was confirmed to be on.  Soft start was confirmed to be on.  Impedences were checked and were within normal limits.  Battery was checked and was determined to be functioning normally and not near the end of life.  Final settings were as follows:   Active Contacts Amplitude (mA) PW (ms) Frequency (hz) Side  effects  Left Brain       04/20/20 3-(90%)4-(10%)C+ 3.3 60 130                        Right Brain       04/20/20 2-(30%)3-(70%)C+ 2.5 60 130

## 2020-04-20 NOTE — Patient Instructions (Signed)
You look amazing!  Keep it up!  The physicians and staff at [facility] are committed to providing excellent care. You may receive a survey requesting feedback about your experience at our office. We strive to receive "very good" responses to the survey questions. If you feel that your experience would prevent you from giving the office a "very good " response, please contact our office to try to remedy the situation. We may be reached at [phone]. Thank you for taking the time out of your busy day to complete the survey.

## 2020-06-09 ENCOUNTER — Other Ambulatory Visit: Payer: Self-pay

## 2020-06-09 MED ORDER — RYTARY 23.75-95 MG PO CPCR: 2.0000 | ORAL_CAPSULE | Freq: Four times a day (QID) | ORAL | 1 refills | Status: DC

## 2020-06-09 NOTE — Telephone Encounter (Signed)
Rx(s) sent to pharmacy electronically.  

## 2020-08-12 ENCOUNTER — Encounter: Payer: Self-pay | Admitting: Family Medicine

## 2020-08-16 ENCOUNTER — Other Ambulatory Visit: Payer: Self-pay | Admitting: Neurology

## 2020-08-19 NOTE — Progress Notes (Signed)
[name] [address] [place] [address] Phone: [phone] Subjective:   Ian [name], am serving as a scribe for Dr. Antoine Primas. This visit occurred during the SARS-CoV-2 public health emergency.  Safety protocols were in place, including screening questions prior to the visit, additional usage of staff PPE, and extensive cleaning of exam room while observing appropriate contact time as indicated for disinfecting solutions.   I'm seeing this patient by the request  of:  Tracey Harries, MD  CC: Back and neck pain follow-up  PPJ:[id number]  Ian [name] is a 59 y.o. male coming in with complaint of back and neck pain. OMT 2019. Patient states   Left shoulder pain. Pain in posterior aspect. Prevent him from working out.   Medications patient has been prescribed:   Taking:         Reviewed prior external information including notes and imaging from previsou exam, outside providers and external EMR if available.   As well as notes that were available from care everywhere and other healthcare systems.  Past medical history, social, surgical and family history all reviewed in electronic medical record.  No pertanent information unless stated regarding to the chief complaint.   Past Medical History:  Diagnosis Date  . Anxiety   . History of kidney stones    passed  . PD (Parkinson's disease) (HCC) 09/10/2013  . PONV (postoperative nausea and vomiting)    nausea     No Known Allergies   Review of Systems:  No headache, visual changes, nausea, vomiting, diarrhea, constipation, dizziness, abdominal pain, skin rash, fevers, chills, night sweats, weight loss, swollen lymph nodes, body aches, joint swelling, chest pain, shortness of breath, mood changes. POSITIVE muscle aches  Objective  Blood pressure 110/72, pulse 92, height 6\' 2"  (1.88 m), weight 163 lb (73.9 kg), SpO2 99 %.   General: No apparent distress alert and oriented x3  mood and affect normal, dressed appropriately.  HEENT: Pupils equal, extraocular movements intact  Respiratory: Patient's speak in full sentences and does not appear short of breath  Cardiovascular: No lower extremity edema, non tender, no erythema  Neuro: Cranial nerves II through XII are intact, neurovascularly intact in all extremities with 2+ DTRs and 2+ pulses.  Gait normal with good balance and coordination.  MSK:  Shoulder: left Inspection reveals no abnormalities, atrophy or asymmetry. Palpation is normal with no tenderness over AC joint or bicipital groove. ROM is full in all planes passively. Rotator cuff strength normal throughout. signs of impingement with positive Neer and Hawkin's tests, but negative empty can sign. Speeds and Yergason's tests normal. No labral pathology noted with negative Obrien's, negative clunk and good stability. Normal scapular function observed. No painful arc and no drop arm sign. No apprehension sign  MSK performed of: left This study was ordered, performed, and interpreted by [place] D.O.  Shoulder:   Supraspinatus: Degenerative changes noted.  Questionable partial thickness tear noted but no significant retraction  Subscapularis:  Appears normal on long and transverse views. Positive bursa AC joint:  Capsule undistended, mild arthritic changes Glenohumeral Joint:  Appears normal without effusion. Glenoid Labrum:  Intact without visualized tears. Biceps Tendon:  Appears normal on long and transverse views, no fraying of tendon, tendon located in intertubercular groove, no subluxation with shoulder internal or external rotation.  Impression: Partial rotator cuff tear seems to be subacute  Procedure: Real-time Ultrasound Guided Injection of left glenohumeral joint Device: GE Logiq E  Ultrasound  guided injection is preferred based studies that show increased duration, increased effect, greater accuracy, decreased procedural pain,  increased response rate with ultrasound guided versus blind injection.  Verbal informed consent obtained.  Time-out conducted.  Noted no overlying erythema, induration, or other signs of local infection.  Skin prepped in a sterile fashion.  Local anesthesia: Topical Ethyl chloride.  With sterile technique and under real time ultrasound guidance:  Joint visualized.  23g 1  inch needle inserted posterior approach. Pictures taken for needle placement. Patient did have injection of 2 cc of 1% lidocaine, 2 cc of 0.5% Marcaine, and 1.0 cc of Kenalog 40 mg/dL. Completed without difficulty  Pain immediately resolved suggesting accurate placement of the medication.  Advised to call if fevers/chills, erythema, induration, drainage, or persistent bleeding.  Images permanently stored and available for review in the ultrasound unit.  Impression: Technically successful ultrasound guided injection.  Osteopathic findings  C6 flexed rotated and side bent left T3 extended rotated and side bent left inhaled rib T9 extended rotated and side bent left L5 flexed rotated and side bent left Sacrum right on right       Assessment and Plan:    Nonallopathic problems  Decision today to treat with OMT was based on Physical Exam  After verbal consent patient was treated with HVLA, ME, FPR techniques in cervical, rib, thoracic, lumbar, and sacral  areas  Patient tolerated the procedure well with improvement in symptoms  Patient given exercises, stretches and lifestyle modifications  See medications in patient instructions if given  Patient will follow up in 4-8 weeks      The above documentation has been reviewed and is accurate and complete Judi Saa, DO       Note: This dictation was prepared with Dragon dictation along with smaller phrase technology. Any transcriptional errors that result from this process are unintentional.

## 2020-08-20 ENCOUNTER — Ambulatory Visit: Payer: Self-pay

## 2020-08-20 ENCOUNTER — Ambulatory Visit (INDEPENDENT_AMBULATORY_CARE_PROVIDER_SITE_OTHER): Payer: Medicare Other | Admitting: Family Medicine

## 2020-08-20 ENCOUNTER — Encounter: Payer: Self-pay | Admitting: Family Medicine

## 2020-08-20 ENCOUNTER — Other Ambulatory Visit: Payer: Self-pay

## 2020-08-20 VITALS — BP 110/72 | HR 92 | Ht 74.0 in | Wt 163.0 lb

## 2020-08-20 DIAGNOSIS — G20A1 Parkinson's disease without dyskinesia, without mention of fluctuations: Secondary | ICD-10-CM

## 2020-08-20 DIAGNOSIS — M75112 Incomplete rotator cuff tear or rupture of left shoulder, not specified as traumatic: Secondary | ICD-10-CM

## 2020-08-20 DIAGNOSIS — G20 Parkinson's disease: Secondary | ICD-10-CM | POA: Diagnosis not present

## 2020-08-20 DIAGNOSIS — G8929 Other chronic pain: Secondary | ICD-10-CM | POA: Diagnosis not present

## 2020-08-20 DIAGNOSIS — M999 Biomechanical lesion, unspecified: Secondary | ICD-10-CM | POA: Diagnosis not present

## 2020-08-20 DIAGNOSIS — M75102 Unspecified rotator cuff tear or rupture of left shoulder, not specified as traumatic: Secondary | ICD-10-CM | POA: Insufficient documentation

## 2020-08-20 DIAGNOSIS — M25512 Pain in left shoulder: Secondary | ICD-10-CM

## 2020-08-20 NOTE — Assessment & Plan Note (Signed)
Patient has what appears to be more of a partial tear of the rotator cuff, given injection today, discussed home exercises and work with Event organiser, discussed avoiding certain activities.  Increase activity slowly, follow-up again 6 to 8 weeks.

## 2020-08-20 NOTE — Patient Instructions (Signed)
See me in 6 weeks Ice 20 min at end of day Hands in peripheral vision

## 2020-08-20 NOTE — Assessment & Plan Note (Signed)
Patient is doing much better with the stimulator at the moment.  Patient has still some very mild resting tremor noted of the right upper and right lower extremity.  Patient's low hyper tenderness that he is improved.  Still causing some mild increase in muscular pain and does respond well to manipulation.  Follow-up again in 4 to 8 weeks

## 2020-09-15 ENCOUNTER — Ambulatory Visit: Payer: Medicare Other | Attending: Neurology

## 2020-09-15 ENCOUNTER — Other Ambulatory Visit: Payer: Self-pay

## 2020-09-15 DIAGNOSIS — R471 Dysarthria and anarthria: Secondary | ICD-10-CM | POA: Diagnosis not present

## 2020-09-15 NOTE — Therapy (Signed)
[facility] [address] [place], [place], [address] Phone: [phone]   Fax:  [phone]  Speech Language Pathology Evaluation  Patient Details  Name: Ian [name] MRN: [id number] Date of Birth: June 21, 1961 No data recorded  Encounter Date: 09/15/2020   End of Session - 09/15/20 0916    Visit Number 1    Number of Visits 17    Date for SLP Re-Evaluation 12/11/20    SLP Start Time 0850    SLP Stop Time  0930    SLP Time Calculation (min) 40 min    Activity Tolerance Patient tolerated treatment well           Past Medical History:  Diagnosis Date  . Anxiety   . History of kidney stones    passed  . PD (Parkinson's disease) (HCC) 09/10/2013  . PONV (postoperative nausea and vomiting)    nausea     Past Surgical History:  Procedure Laterality Date  . COLONOSCOPY    . MINOR PLACEMENT OF FIDUCIAL N/A 01/31/2017   Procedure: MINOR PLACEMENT OF FIDUCIAL;  Surgeon: Maeola Harman, MD;  Location: [facility] OR;  Service: Neurosurgery;  Laterality: N/A;  Fiducial placement  . none    . PULSE GENERATOR IMPLANT Right 02/17/2017   Procedure: Right implantable pulse generator placement;  Surgeon: Maeola Harman, MD;  Location: [facility] OR;  Service: Neurosurgery;  Laterality: Right;  Bilateral implantable pulse generator placement  . SUBTHALAMIC STIMULATOR BATTERY REPLACEMENT N/A 10/19/2018   Procedure: Deep Brain stimulator implantable pulse generator revision;  Surgeon: Maeola Harman, MD;  Location: [facility] OR;  Service: Neurosurgery;  Laterality: N/A;  Deep Brain stimulator implantable pulse generator revision  . SUBTHALAMIC STIMULATOR INSERTION Bilateral 02/10/2017   Procedure: Bilateral Deep brain stimulator placement;  Surgeon: Maeola Harman, MD;  Location[facility] OR;  Service: Neurosurgery;  Laterality: Bilateral;  Bilateral Deep brain stimulator placement  . UPPER GASTROINTESTINAL ENDOSCOPY      There were no vitals filed for this visit.    Subjective Assessment - 09/15/20 0854    Subjective "Not good." (re: his speech)    Currently in Pain? No/denies             SLP Visit Information     SLP Received On  09-15-20     Referring Provider (SLP)  [name], Lurena Joiner, DO     Onset Date  diagnosed fall 2014     Medical Diagnosis  Parkinson's Disease          Subjective    Subjective "not good" (pt, describing his speech)    Patient/Family Stated Goal Improved intelligibility, more consistency with slower speech.         General Information    HPI  Pt well-known at this clinic from previous courses of therapy, working with rate of speech          Balance Screen    Has the patient fallen in the past 6 months  No          Prior Functional Status    Cognitive/Linguistic Baseline  Within functional limits          Cognition    Overall Cognitive Status  Within Functional Limits for tasks assessed          Auditory Comprehension    Overall Auditory Comprehension  Appears within functional limits for tasks assessed          Verbal Expression    Overall Verbal Expression  Appears within functional limits for tasks  assessed          Oral Motor/Sensory Function    Overall Oral Motor/Sensory Function  --   deferred due to clinic masking guidelines         Motor Speech    Overall Motor Speech In conversation pt was 86% intelligible when SLP assessed whether or not each of pt's utterances was intelligible, unintelligible, or "delayed" (or functional), meaning SLP had to engage in focused listening to decipher pt's message due to decr'd intelligibility. In a sentence reading task pt was 95% intelligible as a result of pt achieving greater frequency of slower speech. Lastly in a picture description task (2-3 sentences) pt was 88% intelligible.         Effective Techniques  Slow rate                SLP Education - 07/15/19 1539     Education Details  compensations for incr'd speech cllarity     Person(s) Educated  Patient      Methods  Explanation;Demonstration     Comprehension  Verbalized understanding;Returned demonstration;Verbal cues required;Need further instruction                          SLP Short Term Goals - 09/15/20 0910      SLP SHORT TERM GOAL #1   Title pt will produce 19/20 sentences with reduced speech rate over 3 sessions    Time 4    Period Weeks    Status New      SLP SHORT TERM GOAL #2   Title pt will demo slower speech producing >95% intelligible speech in 7 minutes simple conversation outside ST room in 2 sessions    Time 4    Period Weeks    Status New      SLP SHORT TERM GOAL #3   Title pt will demo HEP for speech intelligibility with modified independence x3 sessions    Time 4    Period Weeks    Status New            SLP Long Term Goals - 09/15/20 0912      SLP LONG TERM GOAL #1   Title pt will demo slower speech producing >95% intelligible speech in 15 minutes simple to mod complex conversation outside ST room x3 sessions    Time 8    Period Weeks   or 17 total sessions   Status New   from "10 minutes" to "15 mintues"     SLP LONG TERM GOAL #2   Title pt will report average no more than 5 requests of wife/friends/family asking him to repeat daily    Time 8    Period Weeks    Status New      SLP LONG TERM GOAL #3   Title pt will score no greater than 65 on speech related QOL measure during one of his last 3 sessions of ST    Time 8    Period Weeks    Status New            Plan - 09/15/20 1212    Clinical Impression Statement Ian "[name]" arrives today presenting with rushed speech as a result of his Parkinsons Disease. He is well-known to SLPs at this clinic from previous courses of ST. Today his speech-related quality of life score is 86. He has had DBS placed, bilaterally. Pt was not 100% intelligible in conversation nor in picture description, or  verbal reading of written sentences of 3-14 words. When he did not have to focus on  gnerating  linguistic content his intelligibility improved. Therefore, pt is stimulable for and would certainly benefit from skilled ST focusing on decr'ing rate of speech and thus improving intelligibilty.    Speech Therapy Frequency 2x / week    Duration --   8 weeks or 17 visits   Treatment/Interventions Functional tasks;SLP instruction and feedback;Compensatory strategies;Internal/external aids;Patient/family education    Potential to Achieve Goals Good           Patient will benefit from skilled therapeutic intervention in order to improve the following deficits and impairments:   Dysarthria and anarthria    Problem List Patient Active Problem List   Diagnosis Date Noted  . Left rotator cuff tear 08/20/2020  . Nonallopathic lesion of sacral region 07/25/2018  . Nonallopathic lesion of lumbosacral region 07/25/2018  . 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 Acute bursitis of right shoulder 12/06/2017  . Antalgic gait 01/24/2017  . Intercostal muscle tear 07/19/2016  . PD (Parkinson's disease) (HCC) 06/24/2016  . 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 Nonallopathic lesion of thoracic region 01/25/2016  . Nonallopathic lesion-rib cage 01/25/2016  . Nonallopathic lesion of cervical region 01/25/2016  . Parkinson's disease (HCC) 01/25/2016  . Lateral epicondylitis 12/01/2014  . REM behavioral disorder 08/27/2014  . Paralysis agitans (HCC) 10/18/2013    [facility] ,MS, CCC-SLP  09/15/2020, 12:31 PM  [facility] [address] [place], [place], [address] Phone: [phone]   Fax:  [phone]  Name: Ian [name] MRN: [id number] Date of Birth: [birth year]

## 2020-09-18 ENCOUNTER — Ambulatory Visit: Payer: Medicare Other | Admitting: Family Medicine

## 2020-09-22 ENCOUNTER — Other Ambulatory Visit: Payer: Self-pay

## 2020-09-22 ENCOUNTER — Ambulatory Visit: Payer: Medicare Other | Attending: Neurology

## 2020-09-22 DIAGNOSIS — R2689 Other abnormalities of gait and mobility: Secondary | ICD-10-CM | POA: Insufficient documentation

## 2020-09-22 DIAGNOSIS — R29818 Other symptoms and signs involving the nervous system: Secondary | ICD-10-CM | POA: Diagnosis present

## 2020-09-22 DIAGNOSIS — R471 Dysarthria and anarthria: Secondary | ICD-10-CM | POA: Diagnosis not present

## 2020-09-22 NOTE — Therapy (Signed)
[facility] [address] [place], [place], [address] Phone: [phone]   Fax:  [phone]  Speech Language Pathology Treatment  Patient Details  Name: Ian [name] MRN: [id number] Date of Birth: [birth year] No data recorded  Encounter Date: 09/22/2020   End of Session - 09/22/20 1043    Visit Number 2    Number of Visits 17    Date for SLP Re-Evaluation 12/11/20    SLP Start Time 0935    SLP Stop Time  1016    SLP Time Calculation (min) 41 min    Activity Tolerance Patient tolerated treatment well           Past Medical History:  Diagnosis Date  . Anxiety   . History of kidney stones    passed  . PD (Parkinson's disease) (HCC) 09/10/2013  . PONV (postoperative nausea and vomiting)    nausea     Past Surgical History:  Procedure Laterality Date  . COLONOSCOPY    . MINOR PLACEMENT OF FIDUCIAL N/A 01/31/2017   Procedure: MINOR PLACEMENT OF FIDUCIAL;  Surgeon: Maeola Harman, MD;  Location: [facility] OR;  Service: Neurosurgery;  Laterality: N/A;  Fiducial placement  . none    . PULSE GENERATOR IMPLANT Right 02/17/2017   Procedure: Right implantable pulse generator placement;  Surgeon: Maeola Harman, MD;  Location: [facility] OR;  Service: Neurosurgery;  Laterality: Right;  Bilateral implantable pulse generator placement  . SUBTHALAMIC STIMULATOR BATTERY REPLACEMENT N/A 10/19/2018   Procedure: Deep Brain stimulator implantable pulse generator revision;  Surgeon: Maeola Harman, MD;  Location: [facility] OR;  Service: Neurosurgery;  Laterality: N/A;  Deep Brain stimulator implantable pulse generator revision  . SUBTHALAMIC STIMULATOR INSERTION Bilateral 02/10/2017   Procedure: Bilateral Deep brain stimulator placement;  Surgeon: Maeola Harman, MD;  Location: [facility] OR;  Service: Neurosurgery;  Laterality: Bilateral;  Bilateral Deep brain stimulator placement  . UPPER GASTROINTESTINAL ENDOSCOPY      There were no vitals filed for this visit.    Subjective Assessment - 09/22/20 0938    Subjective "last weekend was BUSY for me."    Currently in Pain? No/denies                 ADULT SLP TREATMENT - 09/22/20 0939      General Information   Behavior/Cognition Alert;Cooperative;Pleasant mood      Treatment Provided   Treatment provided Cognitive-Linquistic      Cognitive-Linquistic Treatment   Treatment focused on Dysarthria    Skilled Treatment SLP had pt ID three situations in which he finds his speech frustrating or nagging, in order to provide personal patient based therapy. Pt named elders' meeting at church which includes focused discussion on issues in the church, and Men's life group at church including discussion about a lesson. Pt reported that this past weekend he prepared for the lesson, and thought of some things to say - he was more prepared and thus focused more on clearer speech than "hiding" his speech difficulty. (self care-home management- 10 minutes) SLP and pt talked about how "hiding" his speech difficulty actualy makes it moe difficult for him to be clearer with his speech and pt stated "I completely agree". SLP engaged pt in convesation whether or not he may want to do a "public speaking" message at the beginning of his life group and/or the elders' meeting about his speech and then the internal pressure to be 100% with his speech is now eliminated if people know he may rarely run  words together. Pt appeared to agree with this, however more discussion necessary.      Assessment / Recommendations / Plan   Plan Continue with current plan of care      Progression Toward Goals   Progression toward goals Progressing toward goals            SLP Education - 09/22/20 1042    Education Details may want to tell people about his difficulty talking, speaking situations he would like to improve    Person(s) Educated Patient    Methods Explanation    Comprehension Verbalized understanding            SLP  Short Term Goals - 09/22/20 1046      SLP SHORT TERM GOAL #1   Title pt will produce 19/20 sentences with reduced speech rate over 3 sessions    Time 4    Period Weeks    Status On-going      SLP SHORT TERM GOAL #2   Title pt will demo slower speech producing >95% intelligible speech in 7 minutes simple conversation outside ST room in 2 sessions    Time 4    Period Weeks    Status On-going      SLP SHORT TERM GOAL #3   Title pt will demo HEP for speech intelligibility with modified independence x3 sessions    Time 4    Period Weeks    Status On-going            SLP Long Term Goals - 09/22/20 1046      SLP LONG TERM GOAL #1   Title pt will demo slower speech producing >95% intelligible speech in 15 minutes simple to mod complex conversation outside ST room x3 sessions    Time 8    Period Weeks   or 17 total sessions   Status On-going   from "10 minutes" to "15 mintues"     SLP LONG TERM GOAL #2   Title pt will report average no more than 5 requests of wife/friends/family asking him to repeat daily    Time 8    Period Weeks    Status On-going      SLP LONG TERM GOAL #3   Title pt will score no greater than 65 on speech related QOL measure during one of his last 3 sessions of ST    Time 8    Period Weeks    Status On-going      SLP LONG TERM GOAL #4   Title pt will report greater comfort at verbal communication in two situations he ID's he would like to be more successful with his speech    Time 8    Period Weeks    Status New            Plan - 09/22/20 1043    Clinical Impression Statement Ian "Ian [name]" arrives today presenting with rushed speech as a result of his Parkinsons Disease. SLP talked with pt today about situations he would like to see his speech improve - pt named "larger groups" and when asked to be more specific Ian [name] named elders' meeting, and men's life group at church. Pt would cont to benefit from skilled ST focusing on decr'ing rate of speech  and thus improving intelligibilty in situations in which he feels less confident to communicate.    Speech Therapy Frequency 2x / week    Duration --   8 weeks or 17 visits   Treatment/Interventions Functional tasks;SLP  instruction and feedback;Compensatory strategies;Internal/external aids;Patient/family education    Potential to Achieve Goals Good           Patient will benefit from skilled therapeutic intervention in order to improve the following deficits and impairments:   Dysarthria and anarthria    Problem List Patient Active Problem List   Diagnosis Date Noted  . Left rotator cuff tear 08/20/2020  . Nonallopathic lesion of sacral region 07/25/2018  . Nonallopathic lesion of lumbosacral region 07/25/2018  . 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 Acute bursitis of right shoulder 12/06/2017  . Antalgic gait 01/24/2017  . Intercostal muscle tear 07/19/2016  . PD (Parkinson's disease) (HCC) 06/24/2016  . 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 Nonallopathic lesion of thoracic region 01/25/2016  . Nonallopathic lesion-rib cage 01/25/2016  . Nonallopathic lesion of cervical region 01/25/2016  . Parkinson's disease (HCC) 01/25/2016  . Lateral epicondylitis 12/01/2014  . REM behavioral disorder 08/27/2014  . Paralysis agitans (HCC) 10/18/2013    Cendy Oconnor ,MS, CCC-SLP  09/22/2020, 10:48 AM  [facility] [address] [place], [place], [address] Phone: [phone]   Fax:  [phone]   Name: Arsen Mangione MRN: [id number] Date of Birth: 20-Apr-1961

## 2020-09-22 NOTE — Patient Instructions (Signed)
°  Prepare for your elders' meeting on Tuesday some things you might like to say or comment on.  Practice your "off the cuff" prayer.

## 2020-09-29 ENCOUNTER — Ambulatory Visit: Payer: Medicare Other

## 2020-09-29 ENCOUNTER — Other Ambulatory Visit: Payer: Self-pay

## 2020-09-29 DIAGNOSIS — R471 Dysarthria and anarthria: Secondary | ICD-10-CM

## 2020-09-29 NOTE — Therapy (Signed)
[facility] [address] [place], [place], [address] Phone: [phone]   Fax:  [phone]  Speech Language Pathology Treatment  Patient Details  Name: Ian [name] MRN: [id number] Date of Birth: 1961/04/03 No data recorded  Encounter Date: 09/29/2020   End of Session - 09/29/20 1358    Visit Number 3    Number of Visits 17    Date for SLP Re-Evaluation 12/11/20    SLP Start Time 1319    SLP Stop Time  1400    SLP Time Calculation (min) 41 min    Activity Tolerance Patient tolerated treatment well           Past Medical History:  Diagnosis Date  . Anxiety   . History of kidney stones    passed  . PD (Parkinson's disease) (HCC) 09/10/2013  . PONV (postoperative nausea and vomiting)    nausea     Past Surgical History:  Procedure Laterality Date  . COLONOSCOPY    . MINOR PLACEMENT OF FIDUCIAL N/A 01/31/2017   Procedure: MINOR PLACEMENT OF FIDUCIAL;  Surgeon: Maeola Harman, MD;  Location: [facility] OR;  Service: Neurosurgery;  Laterality: N/A;  Fiducial placement  . none    . PULSE GENERATOR IMPLANT Right 02/17/2017   Procedure: Right implantable pulse generator placement;  Surgeon: Maeola Harman, MD;  Location: [facility] OR;  Service: Neurosurgery;  Laterality: Right;  Bilateral implantable pulse generator placement  . SUBTHALAMIC STIMULATOR BATTERY REPLACEMENT N/A 10/19/2018   Procedure: Deep Brain stimulator implantable pulse generator revision;  Surgeon: Maeola Harman, MD;  Location: [facility] OR;  Service: Neurosurgery;  Laterality: N/A;  Deep Brain stimulator implantable pulse generator revision  . SUBTHALAMIC STIMULATOR INSERTION Bilateral 02/10/2017   Procedure: Bilateral Deep brain stimulator placement;  Surgeon: Maeola Harman, MD;  Location: [facility] OR;  Service: Neurosurgery;  Laterality: Bilateral;  Bilateral Deep brain stimulator placement  . UPPER GASTROINTESTINAL ENDOSCOPY      There were no vitals filed for this visit.    Subjective Assessment - 09/29/20 1327    Subjective "I'm tired and i'm at the end of my medicine - but this will be good."    Currently in Pain? No/denies                 ADULT SLP TREATMENT - 09/29/20 1328      General Information   Behavior/Cognition Alert;Cooperative;Pleasant mood      Treatment Provided   Treatment provided Cognitive-Linquistic      Cognitive-Linquistic Treatment   Treatment focused on Dysarthria    Skilled Treatment KEnny shared with SLP "(my speech) is about as bad as it gets for me right now - I'm tired and my meds are coming to an end." --so [name] states this was good for him to practice. He gives his speech on Saturday with his daughter a 7/10 (10= his WNL speech rate). Today, SLP engaged pt in conversation (simple) and pt maintained slower rate 85-90% of the time. SLP had to stop listening for a very short time (1-2 seconds), x4 to focus on specific 2-3 words of pt's content. Pt rated his speech with SLP today in conversation as 7/10 (where 10=WNL rate) as well. Pt did not have a chance to pre-plan for his elder meeting tonight becuase there is no agenda, however [name] did prepare for his men's life group meeting on Sunday and stated to SLP that again, this was very helpful in assisting him to slow his rate to incr. intelligibility with his  group. "I'm going to do that from now on," he told SLP.       Assessment / Recommendations / Plan   Plan Continue with current plan of care      Progression Toward Goals   Progression toward goals Progressing toward goals              SLP Short Term Goals - 09/29/20 1643      SLP SHORT TERM GOAL #1   Title pt will produce 19/20 sentences with reduced speech rate over 3 sessions    Time 3    Period Weeks    Status On-going      SLP SHORT TERM GOAL #2   Title pt will demo slower speech producing >95% intelligible speech in 7 minutes simple conversation outside ST room in 2 sessions    Time 3    Period Weeks     Status On-going      SLP SHORT TERM GOAL #3   Title pt will demo HEP for speech intelligibility with modified independence x3 sessions    Time 3    Period Weeks    Status On-going            SLP Long Term Goals - 09/29/20 1643      SLP LONG TERM GOAL #1   Title pt will demo slower speech producing >95% intelligible speech in 15 minutes simple to mod complex conversation outside ST room x3 sessions    Time 7    Period Weeks   or 17 total sessions   Status On-going   from "10 minutes" to "15 mintues"     SLP LONG TERM GOAL #2   Title pt will report average no more than 5 requests of wife/friends/family asking him to repeat daily    Time 7    Period Weeks    Status On-going      SLP LONG TERM GOAL #3   Title pt will score no greater than 65 on speech related QOL measure during one of his last 3 sessions of ST    Time 7    Period Weeks    Status On-going      SLP LONG TERM GOAL #4   Title pt will report greater comfort at verbal communication in two situations he ID's he would like to be more successful with his speech    Time 7    Period Weeks    Status On-going             Patient will benefit from skilled therapeutic intervention in order to improve the following deficits and impairments:   Dysarthria and anarthria    Problem List Patient Active Problem List   Diagnosis Date Noted  . Left rotator cuff tear 08/20/2020  . Nonallopathic lesion of sacral region 07/25/2018  . Nonallopathic lesion of lumbosacral region 07/25/2018  . 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 Acute bursitis of right shoulder 12/06/2017  . Antalgic gait 01/24/2017  . Intercostal muscle tear 07/19/2016  . PD (Parkinson's disease) (HCC) 06/24/2016  . 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 Nonallopathic lesion of thoracic region 01/25/2016  . Nonallopathic lesion-rib cage 01/25/2016  . Nonallopathic lesion of cervical region 01/25/2016  . Parkinson's disease (HCC) 01/25/2016  .  Lateral epicondylitis 12/01/2014  . REM behavioral disorder 08/27/2014  . Paralysis agitans (HCC) 10/18/2013    Khamora Karan ,MS, CCC-SLP  09/29/2020, 4:43 PM  [facility] [address] [place], [place], [address]  Phone: [phone]   Fax:  [phone]   Name: Ian [name] MRN: [id number] Date of Birth: 10-15-1961

## 2020-09-30 ENCOUNTER — Encounter: Payer: Self-pay | Admitting: Speech Pathology

## 2020-09-30 ENCOUNTER — Ambulatory Visit: Payer: Medicare Other | Admitting: Speech Pathology

## 2020-09-30 DIAGNOSIS — R471 Dysarthria and anarthria: Secondary | ICD-10-CM

## 2020-09-30 NOTE — Therapy (Signed)
[facility] [address] [place], [place], [address] Phone: [phone]   Fax:  [phone]  Speech Language Pathology Treatment  Patient Details  Name: Ian [name] MRN: [id number] Date of Birth: 03/07/1961 No data recorded  Encounter Date: 09/30/2020   End of Session - 09/30/20 1016    Visit Number 4    Number of Visits 17    Date for SLP Re-Evaluation 12/11/20    SLP Start Time 0931    SLP Stop Time  1012    SLP Time Calculation (min) 41 min    Activity Tolerance Patient tolerated treatment well           Past Medical History:  Diagnosis Date  . Anxiety   . History of kidney stones    passed  . PD (Parkinson's disease) (HCC) 09/10/2013  . PONV (postoperative nausea and vomiting)    nausea     Past Surgical History:  Procedure Laterality Date  . COLONOSCOPY    . MINOR PLACEMENT OF FIDUCIAL N/A 01/31/2017   Procedure: MINOR PLACEMENT OF FIDUCIAL;  Surgeon: Maeola Harman, MD;  Location: [facility] OR;  Service: Neurosurgery;  Laterality: N/A;  Fiducial placement  . none    . PULSE GENERATOR IMPLANT Right 02/17/2017   Procedure: Right implantable pulse generator placement;  Surgeon: Maeola Harman, MD;  Location: [facility] OR;  Service: Neurosurgery;  Laterality: Right;  Bilateral implantable pulse generator placement  . SUBTHALAMIC STIMULATOR BATTERY REPLACEMENT N/A 10/19/2018   Procedure: Deep Brain stimulator implantable pulse generator revision;  Surgeon: Maeola Harman, MD;  Location: [facility] OR;  Service: Neurosurgery;  Laterality: N/A;  Deep Brain stimulator implantable pulse generator revision  . SUBTHALAMIC STIMULATOR INSERTION Bilateral 02/10/2017   Procedure: Bilateral Deep brain stimulator placement;  Surgeon: Maeola Harman, MD;  Location: [facility] OR;  Service: Neurosurgery;  Laterality: Bilateral;  Bilateral Deep brain stimulator placement  . UPPER GASTROINTESTINAL ENDOSCOPY      There were no vitals filed for this visit.    Subjective Assessment - 09/30/20 0936    Subjective "I'm better"    Currently in Pain? No/denies                 ADULT SLP TREATMENT - 09/30/20 0936      General Information   Behavior/Cognition Alert;Cooperative;Pleasant mood      Cognitive-Linquistic Treatment   Treatment focused on Dysarthria;Patient/family/caregiver education    Skilled Treatment Ian [name] continues to pre-plan/pre-script phrases to be able to focus on slowing his rate. In conversation, Ian [name] requied rare min A to use slow rate in conversation. In paragraph reading, Ian [name] used slow rate and frequent pausing to be 100% intelligible with rare min A. In simple conversation, Ian [name] required occasional min visual cues to reduce rate. Encouraged him and his wife to use a hand signal when he needs to slow down so she does not interrupt him or call attention to his speech when they are with others.       Assessment / Recommendations / Plan   Plan Continue with current plan of care      Progression Toward Goals   Progression toward goals Progressing toward goals            SLP Education - 09/30/20 1014    Education Details compensations for dysarthria; environmental compensations for dysarthria    Person(s) Educated Patient    Methods Explanation;Demonstration;Handout    Comprehension Verbalized understanding;Verbal cues required;Need further instruction  SLP Short Term Goals - 09/30/20 1016      SLP SHORT TERM GOAL #1   Title pt will produce 19/20 sentences with reduced speech rate over 3 sessions    Time 3    Period Weeks    Status On-going      SLP SHORT TERM GOAL #2   Title pt will demo slower speech producing >95% intelligible speech in 7 minutes simple conversation outside ST room in 2 sessions    Time 3    Period Weeks    Status On-going      SLP SHORT TERM GOAL #3   Title pt will demo HEP for speech intelligibility with modified independence x3 sessions    Time 3    Period Weeks     Status On-going            SLP Long Term Goals - 09/30/20 1016      SLP LONG TERM GOAL #1   Title pt will demo slower speech producing >95% intelligible speech in 15 minutes simple to mod complex conversation outside ST room x3 sessions    Time 7    Period Weeks   or 17 total sessions   Status On-going   from "10 minutes" to "15 mintues"     SLP LONG TERM GOAL #2   Title pt will report average no more than 5 requests of wife/friends/family asking him to repeat daily    Time 7    Period Weeks    Status On-going      SLP LONG TERM GOAL #3   Title pt will score no greater than 65 on speech related QOL measure during one of his last 3 sessions of ST    Time 7    Period Weeks    Status On-going      SLP LONG TERM GOAL #4   Title pt will report greater comfort at verbal communication in two situations he ID's he would like to be more successful with his speech    Time 7    Period Weeks    Status On-going            Plan - 09/30/20 1015    Clinical Impression Statement Ian [name] "Ian [name]" arrives today presenting with rushed speech as a result of his Parkinsons Disease. SLP talked with pt today about situations he would like to see his speech improve - pt named "larger groups" and when asked to be more specific Ian [name] named elders' meeting, and men's life group at church. Pt would cont to benefit from skilled ST focusing on decr'ing rate of speech and thus improving intelligibilty in situations in which he feels less confident to communicate.    Speech Therapy Frequency 2x / week    Duration --   8 weeks or 17 visits   Treatment/Interventions Functional tasks;SLP instruction and feedback;Compensatory strategies;Internal/external aids;Patient/family education    Potential to Achieve Goals Good           Patient will benefit from skilled therapeutic intervention in order to improve the following deficits and impairments:   Dysarthria and anarthria    Problem List Patient Active  Problem List   Diagnosis Date Noted  . Left rotator cuff tear 08/20/2020  . Nonallopathic lesion of sacral region 07/25/2018  . Nonallopathic lesion of lumbosacral region 07/25/2018  . 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 Acute bursitis of right shoulder 12/06/2017  . Antalgic gait 01/24/2017  . Intercostal muscle tear 07/19/2016  . PD (Parkinson's disease) (  HCC) 06/24/2016  . 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 Nonallopathic lesion of thoracic region 01/25/2016  . Nonallopathic lesion-rib cage 01/25/2016  . Nonallopathic lesion of cervical region 01/25/2016  . Parkinson's disease (HCC) 01/25/2016  . Lateral epicondylitis 12/01/2014  . REM behavioral disorder 08/27/2014  . Paralysis agitans (HCC) 10/18/2013    Jettie Lazare, Radene Journey MS, CCC-SLP 09/30/2020, 10:16 AM  [facility] [address] [place], [place], [address] Phone: [phone]   Fax:  [phone]   Name: Ian [name] MRN: [id number] Date of Birth: [birth year]

## 2020-09-30 NOTE — Patient Instructions (Addendum)
   Have a signal with your wife so she can give you feedback without interrupting you or calling attention to your speech  Good job using energy conservation. When you have something in the evening, rest up during the day.  If you have a very busy day, you may notice more fatigue the next day  Continue pre-planning conversations for events and business phone calls. With the phone calls its ok to write down what you want to say  Use external aid such as sticky notes for reminders to slow down when you are on your phone  When you have to read aloud for church etc, mark on the passage where you are going to pause every 3-5 words.   Stress, fatigue and anxiety will affect your speech   Get the persons attention before you speak  Use eye contact and face the person you are speaking to  Be in close proximity to the person you are speaking to  Turn down any noise in the environment such as the TV, walk away from loud appliances, air conditioners, fans, dish washers etc

## 2020-10-01 ENCOUNTER — Ambulatory Visit: Payer: Medicare Other | Admitting: Family Medicine

## 2020-10-05 ENCOUNTER — Other Ambulatory Visit: Payer: Self-pay

## 2020-10-05 ENCOUNTER — Ambulatory Visit: Payer: Medicare Other

## 2020-10-05 DIAGNOSIS — R471 Dysarthria and anarthria: Secondary | ICD-10-CM

## 2020-10-05 NOTE — Therapy (Signed)
[facility] [address] [place], [place], [address] Phone: [phone]   Fax:  [phone]  Speech Language Pathology Treatment  Patient Details  Name: Ian [name] MRN: [id number] Date of Birth: 1961/07/05 No data recorded  Encounter Date: 10/05/2020   End of Session - 10/05/20 1052    Visit Number 5    Number of Visits 17    Date for SLP Re-Evaluation 12/11/20    SLP Start Time 0934    SLP Stop Time  1015    SLP Time Calculation (min) 41 min    Activity Tolerance Patient tolerated treatment well           Past Medical History:  Diagnosis Date  . Anxiety   . History of kidney stones    passed  . PD (Parkinson's disease) (HCC) 09/10/2013  . PONV (postoperative nausea and vomiting)    nausea     Past Surgical History:  Procedure Laterality Date  . COLONOSCOPY    . MINOR PLACEMENT OF FIDUCIAL N/A 01/31/2017   Procedure: MINOR PLACEMENT OF FIDUCIAL;  Surgeon: Maeola Harman, MD;  Location: [facility] OR;  Service: Neurosurgery;  Laterality: N/A;  Fiducial placement  . none    . PULSE GENERATOR IMPLANT Right 02/17/2017   Procedure: Right implantable pulse generator placement;  Surgeon: Maeola Harman, MD;  Location: [facility] OR;  Service: Neurosurgery;  Laterality: Right;  Bilateral implantable pulse generator placement  . SUBTHALAMIC STIMULATOR BATTERY REPLACEMENT N/A 10/19/2018   Procedure: Deep Brain stimulator implantable pulse generator revision;  Surgeon: Maeola Harman, MD;  Location: [facility] OR;  Service: Neurosurgery;  Laterality: N/A;  Deep Brain stimulator implantable pulse generator revision  . SUBTHALAMIC STIMULATOR INSERTION Bilateral 02/10/2017   Procedure: Bilateral Deep brain stimulator placement;  Surgeon: Maeola Harman, MD;  Location: [facility] OR;  Service: Neurosurgery;  Laterality: Bilateral;  Bilateral Deep brain stimulator placement  . UPPER GASTROINTESTINAL ENDOSCOPY      There were no vitals filed for this visit.    Subjective Assessment - 10/05/20 0952    Subjective "When I go into my group at church I get that anxiety coming on."    Currently in Pain? No/denies                 ADULT SLP TREATMENT - 10/05/20 1014      General Information   Behavior/Cognition Alert;Cooperative;Pleasant mood      Treatment Provided   Treatment provided Cognitive-Linquistic      Cognitive-Linquistic Treatment   Treatment focused on Dysarthria;Patient/family/caregiver education    Skilled Treatment See "S". SLP and Bernette [name] talked today about Bernette [name] sharing with his church life group about his speech and Parkinsons, as pt told SLP about his anxiety before and during group - this makes ihs speech worse. SLP engaged pt in discussion about this topic and pt came to the conclusion that it would be beneficial for him and his speech to tell his group about his PD and his speech. Pt to think about how to say this to his group. During this conversation pt with frequent rushes of speech which did notaffect his overall intelligibility, requiring SLP questioning cue for repeat x2. These cues resulted in slowed speech for approx 30 seconds and then pt resumed faster rate of speech. Bernette [name] told SLP that he had just taken his meds and for the first 15-30 minutets after administration his speech is faster. SLP suggested pt keep a log to document these changes for his visit coming up with Dr.  [name]. Homework to keep journal about this.      Assessment / Recommendations / Plan   Plan Continue with current plan of care      Progression Toward Goals   Progression toward goals Progressing toward goals            SLP Education - 10/05/20 1002    Education Details Pt consider sharing with his church group about his speech and his Parkinson's disease    Person(s) Educated Patient    Methods Explanation    Comprehension Verbalized understanding            SLP Short Term Goals - 10/05/20 1054      SLP SHORT TERM GOAL #1   Title pt  will produce 19/20 sentences with reduced speech rate over 3 sessions    Time 2    Period Weeks    Status On-going      SLP SHORT TERM GOAL #2   Title pt will demo slower speech producing >95% intelligible speech in 7 minutes simple conversation outside ST room in 2 sessions    Time 2    Period Weeks    Status On-going      SLP SHORT TERM GOAL #3   Title pt will demo HEP for speech intelligibility with modified independence x3 sessions    Time 2    Period Weeks    Status On-going            SLP Long Term Goals - 10/05/20 1054      SLP LONG TERM GOAL #1   Title pt will demo slower speech producing >95% intelligible speech in 15 minutes simple to mod complex conversation outside ST room x3 sessions    Time 6    Period Weeks   or 17 total sessions   Status On-going   from "10 minutes" to "15 mintues"     SLP LONG TERM GOAL #2   Title pt will report average no more than 5 requests of wife/friends/family asking him to repeat daily    Time 6    Period Weeks    Status On-going      SLP LONG TERM GOAL #3   Title pt will score no greater than 65 on speech related QOL measure during one of his last 3 sessions of ST    Time 6    Period Weeks    Status On-going      SLP LONG TERM GOAL #4   Title pt will report greater comfort at verbal communication in two situations he ID's he would like to be more successful with his speech    Time 6    Period Weeks    Status On-going            Plan - 10/05/20 1052    Clinical Impression Statement Ian "Bernette [name]" arrives today presenting with rushed speech as a result of his Parkinsons Disease. Bernette [name] reported today that his anxiety increases before and during life group due to "wanting to sound as normal as I can". See "skilled intervention" for more details. Pt would cont to benefit from skilled ST focusing on decr'ing rate of speech and thus improving intelligibilty in situations in which he feels less confident to communicate.    Speech  Therapy Frequency 2x / week    Duration --   8 weeks or 17 visits   Treatment/Interventions Functional tasks;SLP instruction and feedback;Compensatory strategies;Internal/external aids;Patient/family education    Potential to Achieve Goals Good  Patient will benefit from skilled therapeutic intervention in order to improve the following deficits and impairments:   Dysarthria and anarthria    Problem List Patient Active Problem List   Diagnosis Date Noted  . Left rotator cuff tear 08/20/2020  . Nonallopathic lesion of sacral region 07/25/2018  . Nonallopathic lesion of lumbosacral region 07/25/2018  . 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 Acute bursitis of right shoulder 12/06/2017  . Antalgic gait 01/24/2017  . Intercostal muscle tear 07/19/2016  . PD (Parkinson's disease) (HCC) 06/24/2016  . 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 Nonallopathic lesion of thoracic region 01/25/2016  . Nonallopathic lesion-rib cage 01/25/2016  . Nonallopathic lesion of cervical region 01/25/2016  . Parkinson's disease (HCC) 01/25/2016  . Lateral epicondylitis 12/01/2014  . REM behavioral disorder 08/27/2014  . Paralysis agitans (HCC) 10/18/2013    Reford Olliff ,MS, CCC-SLP  10/05/2020, 10:54 AM  [facility] [address] [place], [place], [address] Phone: [phone]   Fax:  [phone]   Name: Kaelin Bonelli MRN: [id number] Date of Birth: September 24, 1961

## 2020-10-05 NOTE — Patient Instructions (Signed)
°  Think about how you want to share with your Life Group about your speech and Parkinsons.

## 2020-10-06 ENCOUNTER — Ambulatory Visit (INDEPENDENT_AMBULATORY_CARE_PROVIDER_SITE_OTHER): Payer: Medicare Other | Admitting: Family Medicine

## 2020-10-06 ENCOUNTER — Encounter: Payer: Self-pay | Admitting: Family Medicine

## 2020-10-06 VITALS — BP 100/70 | HR 71 | Ht 73.0 in | Wt 165.0 lb

## 2020-10-06 DIAGNOSIS — M75112 Incomplete rotator cuff tear or rupture of left shoulder, not specified as traumatic: Secondary | ICD-10-CM | POA: Diagnosis not present

## 2020-10-06 DIAGNOSIS — M940 Chondrocostal junction syndrome [Tietze]: Secondary | ICD-10-CM

## 2020-10-06 DIAGNOSIS — M999 Biomechanical lesion, unspecified: Secondary | ICD-10-CM | POA: Diagnosis not present

## 2020-10-06 NOTE — Patient Instructions (Signed)
Great to see you See me again in 6-12 weeks

## 2020-10-06 NOTE — Assessment & Plan Note (Signed)
Patient feels like he is doing relatively well.  Does not really make any changes in management.

## 2020-10-06 NOTE — Progress Notes (Signed)
[name] [address] [place] [address] Phone: [phone] Subjective:   I Ronelle Nigh am serving as a Neurosurgeon for Dr. Antoine Primas.  This visit occurred during the SARS-CoV-2 public health emergency.  Safety protocols were in place, including screening questions prior to the visit, additional usage of staff PPE, and extensive cleaning of exam room while observing appropriate contact time as indicated for disinfecting solutions.   I'm seeing this patient by the request  of:  Tracey Harries, MD  CC: Back and neck pain follow-up  [id number]  Ian [name] is a 59 y.o. male coming in with complaint of back and neck pain. OMT 08/20/2020. Patient states he is doing well today.  Patient states some very mild tightness but nothing severe.  Continues to have some pain within the left shoulder but does not want to do anything different.  Patient just states that it does give him some pain with some mild range of motion.           Reviewed prior external information including notes and imaging from previsou exam, outside providers and external EMR if available.   As well as notes that were available from care everywhere and other healthcare systems.  Past medical history, social, surgical and family history all reviewed in electronic medical record.  No pertanent information unless stated regarding to the chief complaint.   Past Medical History:  Diagnosis Date  . Anxiety   . History of kidney stones    passed  . PD (Parkinson's disease) (HCC) 09/10/2013  . PONV (postoperative nausea and vomiting)    nausea     No Known Allergies   Review of Systems:  No headache, visual changes, nausea, vomiting, diarrhea, constipation, dizziness, abdominal pain, skin rash, fevers, chills, night sweats, weight loss, swollen lymph nodes, body aches, joint swelling, chest pain, shortness of breath, mood changes. POSITIVE muscle aches  Objective  Blood  pressure 100/70, pulse 71, height 6\' 1"  (1.854 m), weight 165 lb (74.8 kg), SpO2 99 %.   General: No apparent distress alert and oriented x3 mood and affect normal, dressed appropriately.  HEENT: Pupils equal, extraocular movements intact  Respiratory: Patient's speak in full sentences and does not appear short of breath  Cardiovascular: No lower extremity edema, non tender, no erythema  Mild tremor of the left lower extremity noted today. Tightness noted in the paraspinal musculature in the parascapular region right greater than left. Very mild tightness with bilaterally of the lower extremities.  Tightness around the sacroiliac joints bilaterally. Left shoulder has good range of motion.  Very mild positive crossover.  5-5 strength of rotator cuff noted. Neck does have some loss of lordosis and does have some limited sidebending bilaterally.   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  Patient has done well with the implant with his Parkinson's.  Very mild tremor noted of the left lower extremity today.  Patient is to continue to be active otherwise.  Continues to make progress on his shoulder.  Follow-up again in 6 to 8 weeks  Left rotator cuff tear Patient feels like he is doing relatively well.  Does  not really make any changes in management.    Nonallopathic problems  Decision today to treat with OMT was based on Physical Exam  After verbal consent patient was treated with HVLA, ME, FPR techniques in cervical, rib, thoracic, lumbar, and sacral  areas  Patient tolerated the procedure well with improvement in symptoms  Patient given exercises,  stretches and lifestyle modifications  See medications in patient instructions if given  Patient will follow up in 4-8 weeks      The above documentation has been reviewed and is accurate and complete Judi Saa, DO       Note: This dictation was prepared with Dragon dictation along with smaller phrase technology. Any transcriptional errors that result from this process are unintentional.

## 2020-10-06 NOTE — Assessment & Plan Note (Signed)
Continues have some mild separate syndrome but is responding very well to osteopathic manipulation again.  Patient has done well with the implant with his Parkinson's.  Very mild tremor noted of the left lower extremity today.  Patient is to continue to be active otherwise.  Continues to make progress on his shoulder.  Follow-up again in 6 to 8 weeks

## 2020-10-07 ENCOUNTER — Encounter: Payer: Self-pay | Admitting: Speech Pathology

## 2020-10-07 ENCOUNTER — Other Ambulatory Visit: Payer: Self-pay

## 2020-10-07 ENCOUNTER — Ambulatory Visit: Payer: Medicare Other | Admitting: Speech Pathology

## 2020-10-07 DIAGNOSIS — R471 Dysarthria and anarthria: Secondary | ICD-10-CM | POA: Diagnosis not present

## 2020-10-07 NOTE — Therapy (Signed)
[facility] [address] [place], [place], [address] Phone: [phone]   Fax:  [phone]  Speech Language Pathology Treatment  Patient Details  Name: Ian [name] MRN: [id number] Date of Birth: 1961/07/22 No data recorded  Encounter Date: 10/07/2020   End of Session - 10/07/20 1019    Visit Number 6    Number of Visits 17    Date for SLP Re-Evaluation 12/11/20    SLP Start Time 0933    SLP Stop Time  1014    SLP Time Calculation (min) 41 min    Activity Tolerance Patient tolerated treatment well           Past Medical History:  Diagnosis Date  . Anxiety   . History of kidney stones    passed  . PD (Parkinson's disease) (HCC) 09/10/2013  . PONV (postoperative nausea and vomiting)    nausea     Past Surgical History:  Procedure Laterality Date  . COLONOSCOPY    . MINOR PLACEMENT OF FIDUCIAL N/A 01/31/2017   Procedure: MINOR PLACEMENT OF FIDUCIAL;  Surgeon: Maeola Harman, MD;  Location: [facility] OR;  Service: Neurosurgery;  Laterality: N/A;  Fiducial placement  . none    . PULSE GENERATOR IMPLANT Right 02/17/2017   Procedure: Right implantable pulse generator placement;  Surgeon: Maeola Harman, MD;  Location: [facility] OR;  Service: Neurosurgery;  Laterality: Right;  Bilateral implantable pulse generator placement  . SUBTHALAMIC STIMULATOR BATTERY REPLACEMENT N/A 10/19/2018   Procedure: Deep Brain stimulator implantable pulse generator revision;  Surgeon: Maeola Harman, MD;  Location: [facility] OR;  Service: Neurosurgery;  Laterality: N/A;  Deep Brain stimulator implantable pulse generator revision  . SUBTHALAMIC STIMULATOR INSERTION Bilateral 02/10/2017   Procedure: Bilateral Deep brain stimulator placement;  Surgeon: Maeola Harman, MD;  Location: [facility] OR;  Service: Neurosurgery;  Laterality: Bilateral;  Bilateral Deep brain stimulator placement  . UPPER GASTROINTESTINAL ENDOSCOPY      There were no vitals filed for this visit.    Subjective Assessment - 10/07/20 0938    Subjective "I may start keeping a journal"    Currently in Pain? No/denies                 ADULT SLP TREATMENT - 10/07/20 0939      General Information   Behavior/Cognition Alert;Cooperative;Pleasant mood      Treatment Provided   Treatment provided Cognitive-Linquistic      Cognitive-Linquistic Treatment   Treatment focused on Dysarthria;Patient/family/caregiver education    Skilled Treatment Discussed with [name] pre-planning conversations and information about who he will be talking with  (names, occupations, topics, kids) to A with informal conversations. In structured sentence generation task,, [name] controlled his rate of speech with rare min A to be intelligible. Conversation while walking outside with varying level of distractions and noises, I did not have to ask for repeat, however required extra time for processing/decoding Kenny's speech 2x in 17 minutes.       Assessment / Recommendations / Plan   Plan Continue with current plan of care      Progression Toward Goals   Progression toward goals Progressing toward goals            SLP Education - 10/07/20 1017    Education Details External compensations for dysarthria    Person(s) Educated Patient    Methods Explanation;Handout    Comprehension Verbalized understanding            SLP Short Term Goals - 10/07/20 1018  SLP SHORT TERM GOAL #1   Title pt will produce 19/20 sentences with reduced speech rate over 3 sessions    Baseline 10/07/20;    Time 2    Period Weeks    Status On-going      SLP SHORT TERM GOAL #2   Title pt will demo slower speech producing >95% intelligible speech in 7 minutes simple conversation outside ST room in 2 sessions    Baseline 10/07/20    Time 2    Period Weeks    Status On-going      SLP SHORT TERM GOAL #3   Title pt will demo HEP for speech intelligibility with modified independence x3 sessions    Time 2    Period Weeks     Status On-going            SLP Long Term Goals - 10/07/20 1019      SLP LONG TERM GOAL #1   Title pt will demo slower speech producing >95% intelligible speech in 15 minutes simple to mod complex conversation outside ST room x3 sessions    Time 6    Period Weeks   or 17 total sessions   Status On-going   from "10 minutes" to "15 mintues"     SLP LONG TERM GOAL #2   Title pt will report average no more than 5 requests of wife/friends/family asking him to repeat daily    Time 6    Period Weeks    Status On-going      SLP LONG TERM GOAL #3   Title pt will score no greater than 65 on speech related QOL measure during one of his last 3 sessions of ST    Time 6    Period Weeks    Status On-going      SLP LONG TERM GOAL #4   Title pt will report greater comfort at verbal communication in two situations he ID's he would like to be more successful with his speech    Time 6    Period Weeks    Status On-going            Plan - 10/07/20 1017    Clinical Impression Statement Ian "[name]" arrives today presenting with rushed speech as a result of his Parkinsons Disease. [name] reported today that his anxiety increases before and during life group due to "wanting to sound as normal as I can". See "skilled intervention" for more details. Pt would cont to benefit from skilled ST focusing on decr'ing rate of speech and thus improving intelligibilty in situations in which he feels less confident to communicate.    Speech Therapy Frequency 2x / week    Duration --   8 weeks or 17 visits   Treatment/Interventions Functional tasks;SLP instruction and feedback;Compensatory strategies;Internal/external aids;Patient/family education    Potential to Achieve Goals Good           Patient will benefit from skilled therapeutic intervention in order to improve the following deficits and impairments:   Dysarthria and anarthria    Problem List Patient Active Problem List   Diagnosis Date  Noted  . Left rotator cuff tear 08/20/2020  . Nonallopathic lesion of sacral region 07/25/2018  . Nonallopathic lesion of lumbosacral region 07/25/2018  . 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 Acute bursitis of right shoulder 12/06/2017  . Antalgic gait 01/24/2017  . Intercostal muscle tear 07/19/2016  . PD (Parkinson's disease) (HCC) 06/24/2016  . 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 Nonallopathic  lesion of thoracic region 01/25/2016  . Nonallopathic lesion-rib cage 01/25/2016  . Nonallopathic lesion of cervical region 01/25/2016  . Parkinson's disease (HCC) 01/25/2016  . Lateral epicondylitis 12/01/2014  . REM behavioral disorder 08/27/2014  . Paralysis agitans (HCC) 10/18/2013    Wilda Wetherell, Radene Journey MS, CCC-SLP 10/07/2020, 10:19 AM  [facility] [address] [place], [place], [address] Phone: [phone]   Fax:  [phone]   Name: Camerin Ladouceur MRN: [id number] Date of Birth: 1961-05-20

## 2020-10-07 NOTE — Patient Instructions (Signed)
   It's a good idea to preplan for any events where you will have to talk, such as peoples names, occupations, topics that may come up, kids names)  With you journal, consider keeping a talley of times you wife asks you to repeat yourself  You are right, the amount of distractions your brain is dealing with affects your ability to focus on slow rate  Limit distractions when you can if you have to talk (TV, radio, background noise)

## 2020-10-12 ENCOUNTER — Other Ambulatory Visit: Payer: Self-pay

## 2020-10-12 ENCOUNTER — Ambulatory Visit: Payer: Medicare Other

## 2020-10-12 DIAGNOSIS — R471 Dysarthria and anarthria: Secondary | ICD-10-CM | POA: Diagnosis not present

## 2020-10-12 NOTE — Therapy (Signed)
[facility] [address] [place], [place], [address] Phone: [phone]   Fax:  [phone]  Speech Language Pathology Treatment  Patient Details  Name: Ian [name] MRN: [id number] Date of Birth: June 10, 1961 No data recorded  Encounter Date: 10/12/2020   End of Session - 10/12/20 1722    Visit Number 7    Number of Visits 17    Date for SLP Re-Evaluation 12/11/20    SLP Start Time 0935    SLP Stop Time  1015    SLP Time Calculation (min) 40 min    Activity Tolerance Patient tolerated treatment well           Past Medical History:  Diagnosis Date  . Anxiety   . History of kidney stones    passed  . PD (Parkinson's disease) (HCC) 09/10/2013  . PONV (postoperative nausea and vomiting)    nausea     Past Surgical History:  Procedure Laterality Date  . COLONOSCOPY    . MINOR PLACEMENT OF FIDUCIAL N/A 01/31/2017   Procedure: MINOR PLACEMENT OF FIDUCIAL;  Surgeon: Maeola Harman, MD;  Location: [facility] OR;  Service: Neurosurgery;  Laterality: N/A;  Fiducial placement  . none    . PULSE GENERATOR IMPLANT Right 02/17/2017   Procedure: Right implantable pulse generator placement;  Surgeon: Maeola Harman, MD;  Location: [facility] OR;  Service: Neurosurgery;  Laterality: Right;  Bilateral implantable pulse generator placement  . SUBTHALAMIC STIMULATOR BATTERY REPLACEMENT N/A 10/19/2018   Procedure: Deep Brain stimulator implantable pulse generator revision;  Surgeon: Maeola Harman, MD;  Location: [facility] OR;  Service: Neurosurgery;  Laterality: N/A;  Deep Brain stimulator implantable pulse generator revision  . SUBTHALAMIC STIMULATOR INSERTION Bilateral 02/10/2017   Procedure: Bilateral Deep brain stimulator placement;  Surgeon: Maeola Harman, MD;  Location: [facility] OR;  Service: Neurosurgery;  Laterality: Bilateral;  Bilateral Deep brain stimulator placement  . UPPER GASTROINTESTINAL ENDOSCOPY      There were no vitals filed for this visit.    Subjective Assessment - 10/12/20 0941    Subjective Pt did not prepare for the lesson for his life group yesterday, but states ht "felt good" yesterday.    Currently in Pain? No/denies                 ADULT SLP TREATMENT - 10/12/20 0942      General Information   Behavior/Cognition Alert;Cooperative;Pleasant mood      Treatment Provided   Treatment provided Cognitive-Linquistic      Cognitive-Linquistic Treatment   Treatment focused on Dysarthria;Patient/family/caregiver education    Skilled Treatment Pt remarked he entered his group yesterday talking well and this gave him more confidence. He took his medicine at 9:30 AM and this assisted his speech. Pt tells SLP at this time he is "right in the middle of it". Pt ID'd that calming him down is helpful for his speech to be slower. He reports fatigue exacerbates the rushed speech and it is immensely difficult at the end of a busy day for him to maintain slower rate of speech. SLP reiterated to pt to plan his day- talking-responsible conversations earlier in the day, resting in early afternoon to have him "power up" (pt's words) for later afternoon-evening. Pt told SLP that "if (he) wants to (he) can slow it down but it takes a lot of thought." SLP affirmed this to pt. In conversation about these topics, SLP paused to process pt's message x2 (20 minutes). Outdoors, SLP timed conversation in 3-minute increments and  pt rated his level of slowing his speech from 1-10 (10=100% focus/feeling) - pt rated first two segments 8/10 and last segment 8.5/10. SLP had to process pt's message x1 over that ~10 minutes.       Assessment / Recommendations / Plan   Plan Continue with current plan of care      Progression Toward Goals   Progression toward goals Progressing toward goals            SLP Education - 10/12/20 1721    Education Details modifying heavy talk days to "[place]" his speech, compensations for modification of events for  facilitating clearer speech    Person(s) Educated Patient    Methods Explanation    Comprehension Verbalized understanding            SLP Short Term Goals - 10/12/20 1723      SLP SHORT TERM GOAL #1   Title pt will produce 19/20 sentences with reduced speech rate over 3 sessions    Baseline 10/07/20;10-12-20    Time 1    Period Weeks    Status On-going      SLP SHORT TERM GOAL #2   Title pt will demo slower speech producing >95% intelligible speech in 7 minutes simple conversation outside ST room in 2 sessions    Baseline 10/07/20    Status Achieved      SLP SHORT TERM GOAL #3   Title pt will demo HEP for speech intelligibility with modified independence x3 sessions    Status Deferred   more focus on functional speech than drill in ST sessions           SLP Long Term Goals - 10/12/20 1724      SLP LONG TERM GOAL #1   Title pt will demo slower speech producing >95% intelligible speech in 15 minutes simple to mod complex conversation outside ST room x3 sessions    Time 5    Period Weeks   or 17 total sessions   Status On-going   from "10 minutes" to "15 mintues"     SLP LONG TERM GOAL #2   Title pt will report average no more than 5 requests of wife/friends/family asking him to repeat daily    Time 5    Period Weeks    Status On-going      SLP LONG TERM GOAL #3   Title pt will score no greater than 65 on speech related QOL measure during one of his last 3 sessions of ST    Time 5    Period Weeks    Status On-going      SLP LONG TERM GOAL #4   Title pt will report greater comfort at verbal communication in two situations he ID's he would like to be more successful with his speech    Time 5    Period Weeks    Status On-going            Plan - 10/12/20 1723    Clinical Impression Statement Tramane "[name]" arrives today presenting with rushed speech as a result of his Parkinsons Disease. See "skilled intervention" for more details. Pt would cont to benefit from  skilled ST focusing on decr'ing rate of speech and thus improving intelligibilty in situations in which he feels less confident to communicate.    Speech Therapy Frequency 2x / week    Duration --   8 weeks or 17 visits   Treatment/Interventions Functional tasks;SLP instruction and feedback;Compensatory strategies;Internal/external aids;Patient/family education  Potential to Achieve Goals Good           Patient will benefit from skilled therapeutic intervention in order to improve the following deficits and impairments:   Dysarthria and anarthria    Problem List Patient Active Problem List   Diagnosis Date Noted  . Left rotator cuff tear 08/20/2020  . Nonallopathic lesion of sacral region 07/25/2018  . Nonallopathic lesion of lumbosacral region 07/25/2018  . 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 Acute bursitis of right shoulder 12/06/2017  . Antalgic gait 01/24/2017  . Intercostal muscle tear 07/19/2016  . PD (Parkinson's disease) (HCC) 06/24/2016  . 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 Nonallopathic lesion of thoracic region 01/25/2016  . Nonallopathic lesion-rib cage 01/25/2016  . Nonallopathic lesion of cervical region 01/25/2016  . Parkinson's disease (HCC) 01/25/2016  . Lateral epicondylitis 12/01/2014  . REM behavioral disorder 08/27/2014  . Paralysis agitans (HCC) 10/18/2013    [facility] ,MS, CCC-SLP  10/12/2020, 5:25 PM  [facility] [address] [place], [place], [address] Phone: [phone]   Fax:  [phone]   Name: Oda Lansdowne MRN: [id number] Date of Birth: [birth year]

## 2020-10-13 ENCOUNTER — Ambulatory Visit: Payer: Medicare Other | Admitting: Physical Therapy

## 2020-10-13 ENCOUNTER — Ambulatory Visit: Payer: Medicare Other | Admitting: Occupational Therapy

## 2020-10-13 ENCOUNTER — Ambulatory Visit: Payer: Medicare Other

## 2020-10-13 DIAGNOSIS — R29818 Other symptoms and signs involving the nervous system: Secondary | ICD-10-CM

## 2020-10-13 DIAGNOSIS — R2689 Other abnormalities of gait and mobility: Secondary | ICD-10-CM

## 2020-10-13 NOTE — Therapy (Signed)
[facility] [address] [place], [place], [address] Phone: [phone]   Fax:  [phone]  Patient Details  Name: Daijon Wenke MRN: [id number] Date of Birth: 01-Nov-1961 Referring Provider:  Vladimir Faster, DO  Encounter Date: 10/13/2020   Physical Therapy Parkinson's Disease Screen   Timed Up and Go test:  7.82 sec  10 meter walk test:7 sec = 4.69 ft/sec  5 time sit to stand test: 8.13 sec  Patient does not require Physical Therapy services at this time.  Recommend Physical Therapy screen in 6-9 months.     Ermin Parisien W. 10/13/2020, 1:40 PM  Lonia Blood, PT 10/13/20 1:48 PM Phone: [phone] Fax: [phone]   [facility] [address] [place], [place], [address] Phone: [phone]   Fax:  [phone]

## 2020-10-13 NOTE — Therapy (Signed)
[facility] [address] [place], [place], [address] Phone: [phone]   Fax:  [phone]  Patient Details  Name: Ian [name] MRN: [id number] Date of Birth: 09-Sep-1961 Referring Provider:  Tracey Harries, MD  Encounter Date: 10/13/2020   Occupational Therapy Parkinson's Disease Screen  Hand dominance:  right   Physical Performance Test item #4 (donning/doffing jacket):  6.94 sec  (feet close together)  Fastening/unfastening 3 buttons in:  26.87sec  9-hole peg test:    RUE  27.22 sec        LUE  26.25 sec  Box & Blocks Test:   RUE  60 blocks        LUE  63 blocks  Change in ability to perform ADLs/IADLs:  No  Other Comments:  mild decr elbow ext LUE and seeing ortho for partial tear of RTC  Pt does not require occupational therapy services at this time.  Recommended occupational therapy screen in   6-9 months.     [facility] 10/13/2020, 1:22 PM  [facility] [address] [place], [place], [address] Phone: [phone]   Fax:  [phone]   Willa Frater, OTR/L [facility] [address] [place], [place]  [address] [phone] phone [phone] 10/13/20 2:41 PM

## 2020-10-14 ENCOUNTER — Ambulatory Visit: Payer: Medicare Other

## 2020-10-16 ENCOUNTER — Ambulatory Visit: Payer: Medicare Other

## 2020-10-16 ENCOUNTER — Other Ambulatory Visit: Payer: Self-pay

## 2020-10-16 DIAGNOSIS — R471 Dysarthria and anarthria: Secondary | ICD-10-CM | POA: Diagnosis not present

## 2020-10-16 NOTE — Therapy (Signed)
[facility] [address] [place], [place], [address] Phone: [phone]   Fax:  [phone]  Speech Language Pathology Treatment  Patient Details  Name: Ian [name] MRN: [id number] Date of Birth: Sep 09, 1961 No data recorded  Encounter Date: 10/16/2020   End of Session - 10/16/20 1140    Visit Number 8    Number of Visits 17    Date for SLP Re-Evaluation 12/11/20    SLP Start Time 0850    SLP Stop Time  0931    SLP Time Calculation (min) 41 min    Activity Tolerance Patient tolerated treatment well           Past Medical History:  Diagnosis Date  . Anxiety   . History of kidney stones    passed  . PD (Parkinson's disease) (HCC) 09/10/2013  . PONV (postoperative nausea and vomiting)    nausea     Past Surgical History:  Procedure Laterality Date  . COLONOSCOPY    . MINOR PLACEMENT OF FIDUCIAL N/A 01/31/2017   Procedure: MINOR PLACEMENT OF FIDUCIAL;  Surgeon: Maeola Harman, MD;  Location: [facility] OR;  Service: Neurosurgery;  Laterality: N/A;  Fiducial placement  . none    . PULSE GENERATOR IMPLANT Right 02/17/2017   Procedure: Right implantable pulse generator placement;  Surgeon: Maeola Harman, MD;  Location: [facility] OR;  Service: Neurosurgery;  Laterality: Right;  Bilateral implantable pulse generator placement  . SUBTHALAMIC STIMULATOR BATTERY REPLACEMENT N/A 10/19/2018   Procedure: Deep Brain stimulator implantable pulse generator revision;  Surgeon: Maeola Harman, MD;  Location: [facility] OR;  Service: Neurosurgery;  Laterality: N/A;  Deep Brain stimulator implantable pulse generator revision  . SUBTHALAMIC STIMULATOR INSERTION Bilateral 02/10/2017   Procedure: Bilateral Deep brain stimulator placement;  Surgeon: Maeola Harman, MD;  Location: [facility] OR;  Service: Neurosurgery;  Laterality: Bilateral;  Bilateral Deep brain stimulator placement  . UPPER GASTROINTESTINAL ENDOSCOPY      There were no vitals filed for this visit.    Subjective Assessment - 10/16/20 0858    Subjective Pt plan is to prepare his life group today adn tomorrow.    Currently in Pain? No/denies                 ADULT SLP TREATMENT - 10/16/20 0858      General Information   Behavior/Cognition Alert;Cooperative;Pleasant mood      Treatment Provided   Treatment provided Cognitive-Linquistic      Cognitive-Linquistic Treatment   Treatment focused on Dysarthria;Patient/family/caregiver education    Skilled Treatment In order to have pt incr his success with overarticulation SLP asked him what he thought about or felt when he was slowing his speech. Ian [name] had some difficulty with verbalizing specifics other than "I'm slowing down" or "my meds are good." SLP told pt to think about what he is doing, actively, to make his speech slower. In conversation about simple to mod complex topics, SLP paused to process pt's message x1 (10 minutes). Outdoors, SLP timed conversation in 3-minute increments and pt rated his level of slowing his speech - pt's best segment was his last segment - pt did more preplanning and felt himself overarticulating. SLP had to pause and process pt's message x2 over that ~10 minutes.       Assessment / Recommendations / Plan   Plan Continue with current plan of care      Progression Toward Goals   Progression toward goals Progressing toward goals  SLP Short Term Goals - 10/16/20 0854      SLP SHORT TERM GOAL #1   Title pt will produce 19/20 sentences with reduced speech rate over 3 sessions    Baseline 10/07/20;10-12-20, 10-17-20    Status Achieved      SLP SHORT TERM GOAL #2   Title pt will demo slower speech producing >95% intelligible speech in 7 minutes simple conversation outside ST room in 2 sessions    Status Achieved      SLP SHORT TERM GOAL #3   Title pt will demo HEP for speech intelligibility with modified independence x3 sessions    Status Deferred   more focus on functional speech  than drill in ST sessions           SLP Long Term Goals - 10/16/20 0855      SLP LONG TERM GOAL #1   Title pt will demo slower speech producing >95% intelligible speech in 15 minutes simple to mod complex conversation outside ST room x3 sessions    Time 5    Period Weeks   or 17 total sessions   Status On-going   from "10 minutes" to "15 mintues"     SLP LONG TERM GOAL #2   Title pt will report average no more than 5 requests of wife/friends/family asking him to repeat daily    Time 5    Period Weeks    Status On-going      SLP LONG TERM GOAL #3   Title pt will score no greater than 65 on speech related QOL measure during one of his last 3 sessions of ST    Time 5    Period Weeks    Status On-going      SLP LONG TERM GOAL #4   Title pt will report greater comfort at verbal communication in two situations he ID's he would like to be more successful with his speech    Time 5    Period Weeks    Status On-going            Plan - 10/16/20 1140    Clinical Impression Statement Ian "Ian [name]" arrives today presenting with cont'd rushed speech as a result of his Parkinsons Disease. Pt began today to pinpoint what he is actively doing to decr his rate and improve intelligibility. See "skilled intervention" for more details. Pt would cont to benefit from skilled ST focusing on decr'ing rate of speech and thus improving intelligibilty in situations in which he feels less confident to communicate.    Speech Therapy Frequency 2x / week    Duration --   8 weeks or 17 visits   Treatment/Interventions Functional tasks;SLP instruction and feedback;Compensatory strategies;Internal/external aids;Patient/family education    Potential to Achieve Goals Good           Patient will benefit from skilled therapeutic intervention in order to improve the following deficits and impairments:   Dysarthria and anarthria    Problem List Patient Active Problem List   Diagnosis Date Noted  .  Left rotator cuff tear 08/20/2020  . Nonallopathic lesion of sacral region 07/25/2018  . Nonallopathic lesion of lumbosacral region 07/25/2018  . 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 Acute bursitis of right shoulder 12/06/2017  . Antalgic gait 01/24/2017  . Intercostal muscle tear 07/19/2016  . PD (Parkinson's disease) (HCC) 06/24/2016  . 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 Nonallopathic lesion of thoracic region 01/25/2016  . Nonallopathic lesion-rib cage 01/25/2016  . Nonallopathic lesion  of cervical region 01/25/2016  . Parkinson's disease (HCC) 01/25/2016  . Lateral epicondylitis 12/01/2014  . REM behavioral disorder 08/27/2014  . Paralysis agitans (HCC) 10/18/2013    Rony Ratz ,MS, CCC-SLP  10/16/2020, 11:41 AM  [facility] [address] [place], [place], [address] Phone: [phone]   Fax:  [phone]   Name: Ian [name] MRN: [id number] Date of Birth: May 13, 1961

## 2020-10-19 ENCOUNTER — Ambulatory Visit: Payer: Medicare Other | Attending: Neurology

## 2020-10-19 ENCOUNTER — Other Ambulatory Visit: Payer: Self-pay

## 2020-10-19 DIAGNOSIS — R471 Dysarthria and anarthria: Secondary | ICD-10-CM

## 2020-10-19 NOTE — Therapy (Signed)
[facility] [address] [place], [place], [address] Phone: [phone]   Fax:  [phone]  Speech Language Pathology Treatment  Patient Details  Name: Ian [name] MRN: [id number] Date of Birth: 1961/09/29 No data recorded  Encounter Date: 10/19/2020   End of Session - 10/19/20 1148    Visit Number 9    Number of Visits 17    Date for SLP Re-Evaluation 12/11/20    SLP Start Time 0934    SLP Stop Time  1016    SLP Time Calculation (min) 42 min    Activity Tolerance Patient tolerated treatment well           Past Medical History:  Diagnosis Date  . Anxiety   . History of kidney stones    passed  . PD (Parkinson's disease) (HCC) 09/10/2013  . PONV (postoperative nausea and vomiting)    nausea     Past Surgical History:  Procedure Laterality Date  . COLONOSCOPY    . MINOR PLACEMENT OF FIDUCIAL N/A 01/31/2017   Procedure: MINOR PLACEMENT OF FIDUCIAL;  Surgeon: Maeola Harman, MD;  Location: [facility] OR;  Service: Neurosurgery;  Laterality: N/A;  Fiducial placement  . none    . PULSE GENERATOR IMPLANT Right 02/17/2017   Procedure: Right implantable pulse generator placement;  Surgeon: Maeola Harman, MD;  Location: [facility] OR;  Service: Neurosurgery;  Laterality: Right;  Bilateral implantable pulse generator placement  . SUBTHALAMIC STIMULATOR BATTERY REPLACEMENT N/A 10/19/2018   Procedure: Deep Brain stimulator implantable pulse generator revision;  Surgeon: Maeola Harman, MD;  Location: [facility] OR;  Service: Neurosurgery;  Laterality: N/A;  Deep Brain stimulator implantable pulse generator revision  . SUBTHALAMIC STIMULATOR INSERTION Bilateral 02/10/2017   Procedure: Bilateral Deep brain stimulator placement;  Surgeon: Maeola Harman, MD;  Location: [facility] OR;  Service: Neurosurgery;  Laterality: Bilateral;  Bilateral Deep brain stimulator placement  . UPPER GASTROINTESTINAL ENDOSCOPY      There were no vitals filed for this visit.    Subjective Assessment - 10/19/20 0949    Subjective Pt talking at a rate functional for 100% intelligibility in first 8 minutes.    Currently in Pain? No/denies                 ADULT SLP TREATMENT - 10/19/20 0950      General Information   Behavior/Cognition Alert;Cooperative;Pleasant mood      Treatment Provided   Treatment provided Cognitive-Linquistic      Cognitive-Linquistic Treatment   Treatment focused on Dysarthria;Patient/family/caregiver education    Skilled Treatment "I didn't get in a whole lot of words yesterday at the life group but a couple tiems I did talk I talked slowly." SLP asked pt what he was actively doing to faciliate incr'd intelligibility and pt stated firstly he felt the mask inhibited him because he couldn't feel the lips moving as much as he needed to. SLP congratulated pt in knowing his lips needed to move more in order to incr intelligiliby. Pt then said he feels his mouth opening wider and exaggetating the speech movements. SLP again congratualted pt in realizing that this was something necessary for him to improve his speech clarity. SLP equated "hand flicks" for fine motor hand movements to exaggeration of speech movement in order to train muscualture to hit the articulatory targets for improved articulation and thus improved intelligibility. SLP took pt outdoors and pt self corrected x2 - req'd SLP to cue pt to reduce rate x1. SLP reminded pt the more  he practiced at lower levels (sentence, multi-sentnece) the better he will do in conversation. SLP provided pt with some multi-sentence tasks.       Assessment / Recommendations / Plan   Plan Continue with current plan of care      Progression Toward Goals   Progression toward goals Progressing toward goals            SLP Education - 10/19/20 1141    Education Details practice at sentnece and multi-sentence levels for more success with conversation    Person(s) Educated Patient    Methods  Explanation;Handout    Comprehension Verbalized understanding            SLP Short Term Goals - 10/16/20 0854      SLP SHORT TERM GOAL #1   Title pt will produce 19/20 sentences with reduced speech rate over 3 sessions    Baseline 10/07/20;10-12-20, 10-17-20    Status Achieved      SLP SHORT TERM GOAL #2   Title pt will demo slower speech producing >95% intelligible speech in 7 minutes simple conversation outside ST room in 2 sessions    Status Achieved      SLP SHORT TERM GOAL #3   Title pt will demo HEP for speech intelligibility with modified independence x3 sessions    Status Deferred   more focus on functional speech than drill in ST sessions           SLP Long Term Goals - 10/19/20 1149      SLP LONG TERM GOAL #1   Title pt will demo slower speech producing >95% intelligible speech in 15 minutes simple to mod complex conversation outside ST room x3 sessions    Time 4    Period Weeks   or 17 total sessions   Status On-going   from "10 minutes" to "15 mintues"     SLP LONG TERM GOAL #2   Title pt will report average no more than 5 requests of wife/friends/family asking him to repeat daily    Time 4    Period Weeks    Status On-going      SLP LONG TERM GOAL #3   Title pt will score no greater than 65 on speech related QOL measure during one of his last 3 sessions of ST    Time 4    Period Weeks    Status On-going      SLP LONG TERM GOAL #4   Title pt will report greater comfort at verbal communication in two situations he ID's he would like to be more successful with his speech    Time 4    Period Weeks    Status On-going            Plan - 10/19/20 1148    Clinical Impression Statement Ian "[name]" arrives today presenting with cont'd rushed speech as a result of his Parkinsons Disease. Pt did a little better today to pinpoint what he is actively doing to decr his rate and improve intelligibility. See "skilled intervention" for more details. Pt would  cont to benefit from skilled ST focusing on decr'ing rate of speech and thus improving intelligibilty in situations in which he feels less confident to communicate.    Speech Therapy Frequency 2x / week    Duration --   8 weeks or 17 visits   Treatment/Interventions Functional tasks;SLP instruction and feedback;Compensatory strategies;Internal/external aids;Patient/family education    Potential to Achieve Goals Good  Patient will benefit from skilled therapeutic intervention in order to improve the following deficits and impairments:   Dysarthria and anarthria    Problem List Patient Active Problem List   Diagnosis Date Noted  . Left rotator cuff tear 08/20/2020  . Nonallopathic lesion of sacral region 07/25/2018  . Nonallopathic lesion of lumbosacral region 07/25/2018  . 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 Acute bursitis of right shoulder 12/06/2017  . Antalgic gait 01/24/2017  . Intercostal muscle tear 07/19/2016  . PD (Parkinson's disease) (HCC) 06/24/2016  . 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 Nonallopathic lesion of thoracic region 01/25/2016  . Nonallopathic lesion-rib cage 01/25/2016  . Nonallopathic lesion of cervical region 01/25/2016  . Parkinson's disease (HCC) 01/25/2016  . Lateral epicondylitis 12/01/2014  . REM behavioral disorder 08/27/2014  . Paralysis agitans (HCC) 10/18/2013    Coston Mandato ,MS, CCC-SLP  10/19/2020, 11:49 AM  [facility] [address] [place], [place], [address] Phone: [phone]   Fax:  [phone]   Name: Ian [name] MRN: [id number] Date of Birth: 1961/05/28

## 2020-10-21 ENCOUNTER — Encounter: Payer: Self-pay | Admitting: Speech Pathology

## 2020-10-21 ENCOUNTER — Other Ambulatory Visit: Payer: Self-pay

## 2020-10-21 ENCOUNTER — Ambulatory Visit: Payer: Medicare Other | Admitting: Speech Pathology

## 2020-10-21 DIAGNOSIS — R471 Dysarthria and anarthria: Secondary | ICD-10-CM | POA: Diagnosis not present

## 2020-10-21 NOTE — Therapy (Addendum)
[facility] [address] [place], [place], [address] Phone: [phone]   Fax:  [phone]  Speech Language Pathology Treatment  Patient Details  Name: Ian [name] MRN: [id number] Date of Birth: 1960/12/23 No data recorded  Encounter Date: 10/21/2020   End of Session - 10/21/20 1007    Visit Number 10    Number of Visits 17    Date for SLP Re-Evaluation 12/11/20    SLP Start Time 0932    SLP Stop Time  1005   pt requested to leave early for another appt   SLP Time Calculation (min) 33 min    Activity Tolerance Patient tolerated treatment well           Past Medical History:  Diagnosis Date  . Anxiety   . History of kidney stones    passed  . PD (Parkinson's disease) (HCC) 09/10/2013  . PONV (postoperative nausea and vomiting)    nausea     Past Surgical History:  Procedure Laterality Date  . COLONOSCOPY    . MINOR PLACEMENT OF FIDUCIAL N/A 01/31/2017   Procedure: MINOR PLACEMENT OF FIDUCIAL;  Surgeon: Maeola Harman, MD;  Location: [facility] OR;  Service: Neurosurgery;  Laterality: N/A;  Fiducial placement  . none    . PULSE GENERATOR IMPLANT Right 02/17/2017   Procedure: Right implantable pulse generator placement;  Surgeon: Maeola Harman, MD;  Location: [facility] OR;  Service: Neurosurgery;  Laterality: Right;  Bilateral implantable pulse generator placement  . SUBTHALAMIC STIMULATOR BATTERY REPLACEMENT N/A 10/19/2018   Procedure: Deep Brain stimulator implantable pulse generator revision;  Surgeon: Maeola Harman, MD;  Location: [facility] OR;  Service: Neurosurgery;  Laterality: N/A;  Deep Brain stimulator implantable pulse generator revision  . SUBTHALAMIC STIMULATOR INSERTION Bilateral 02/10/2017   Procedure: Bilateral Deep brain stimulator placement;  Surgeon: Maeola Harman, MD;  Location: [facility] OR;  Service: Neurosurgery;  Laterality: Bilateral;  Bilateral Deep brain stimulator placement  . UPPER GASTROINTESTINAL ENDOSCOPY       There were no vitals filed for this visit.   Subjective Assessment - 10/21/20 0941    Subjective "I had my physica yesterday, and it was good"    Currently in Pain? Yes    Pain Score 7     Pain Location Neck    Pain Orientation Right    Pain Type Acute pain    Pain Onset In the past 7 days    Pain Frequency Constant                 ADULT SLP TREATMENT - 10/21/20 0942      General Information   Behavior/Cognition Alert;Cooperative;Pleasant mood      Treatment Provided   Treatment provided Cognitive-Linquistic      Cognitive-Linquistic Treatment   Treatment focused on Dysarthria;Patient/family/caregiver education    Skilled Treatment Reviewed over articulation to hit articulatory targets to slow rate and improve intelligibility. Practiced this is sentence reading to recalibrate rate, with rare min A. In structured task generating 3 sentences using over articulation with occasional min A and consistently modeling. Ian [name] had to leave early for appointment, conversation was not targeted today. He is to set time to practice with his wife.        Assessment / Recommendations / Plan   Plan Continue with current plan of care      Progression Toward Goals   Progression toward goals Progressing toward goals            SLP Education - 10/21/20  1006    Education Details practice sentences and paragraphs with open mouth and over articulation    Person(s) Educated Patient    Methods Explanation;Demonstration;Verbal cues    Comprehension Returned demonstration;Verbal cues required          Speech Therapy Progress Note  Dates of Reporting Period: 09/15/20  to 10/21/20  Objective Reports of Subjective Statement:   Objective Measurements: See goals and skilled treatment  Goal Update: Continue Goals  Plan: Continue POC  Reason Skilled Services are Required: Ongoing training in compensations for dysarthria to improve intelligibility at home and in the community for  independence and QOL   SLP Short Term Goals - 10/21/20 1007      SLP SHORT TERM GOAL #1   Title pt will produce 19/20 sentences with reduced speech rate over 3 sessions    Baseline 10/07/20;10-12-20, 10-17-20    Status Achieved      SLP SHORT TERM GOAL #2   Title pt will demo slower speech producing >95% intelligible speech in 7 minutes simple conversation outside ST room in 2 sessions    Status Achieved      SLP SHORT TERM GOAL #3   Title pt will demo HEP for speech intelligibility with modified independence x3 sessions    Status Deferred   more focus on functional speech than drill in ST sessions           SLP Long Term Goals - 10/21/20 1007      SLP LONG TERM GOAL #1   Title pt will demo slower speech producing >95% intelligible speech in 15 minutes simple to mod complex conversation outside ST room x3 sessions    Time 4    Period Weeks   or 17 total sessions   Status On-going   from "10 minutes" to "15 mintues"     SLP LONG TERM GOAL #2   Title pt will report average no more than 5 requests of wife/friends/family asking him to repeat daily    Time 4    Period Weeks    Status On-going      SLP LONG TERM GOAL #3   Title pt will score no greater than 65 on speech related QOL measure during one of his last 3 sessions of ST    Time 4    Period Weeks    Status On-going      SLP LONG TERM GOAL #4   Title pt will report greater comfort at verbal communication in two situations he ID's he would like to be more successful with his speech    Time 4    Period Weeks    Status On-going            Plan - 10/21/20 1007    Clinical Impression Statement Ian "Ian [name]" arrives today presenting with cont'd rushed speech as a result of his Parkinsons Disease. Pt did a little better today to pinpoint what he is actively doing to decr his rate and improve intelligibility. See "skilled intervention" for more details. Pt would cont to benefit from skilled ST focusing on decr'ing rate  of speech and thus improving intelligibilty in situations in which he feels less confident to communicate.    Speech Therapy Frequency 2x / week    Duration --   8 weeks or 17 visits   Treatment/Interventions Functional tasks;SLP instruction and feedback;Compensatory strategies;Internal/external aids;Patient/family education    Potential to Achieve Goals Good           Patient will  benefit from skilled therapeutic intervention in order to improve the following deficits and impairments:   Dysarthria and anarthria    Problem List Patient Active Problem List   Diagnosis Date Noted  . Left rotator cuff tear 08/20/2020  . Nonallopathic lesion of sacral region 07/25/2018  . Nonallopathic lesion of lumbosacral region 07/25/2018  . 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 Acute bursitis of right shoulder 12/06/2017  . Antalgic gait 01/24/2017  . Intercostal muscle tear 07/19/2016  . PD (Parkinson's disease) (HCC) 06/24/2016  . 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 Nonallopathic lesion of thoracic region 01/25/2016  . Nonallopathic lesion-rib cage 01/25/2016  . Nonallopathic lesion of cervical region 01/25/2016  . Parkinson's disease (HCC) 01/25/2016  . Lateral epicondylitis 12/01/2014  . REM behavioral disorder 08/27/2014  . Paralysis agitans (HCC) 10/18/2013    Ian [name], Ian Journey MS, CCC-SLP 10/21/2020, 10:08 AM  [facility] [address] [place], [place], [address] Phone: [phone]   Fax:  [phone]   Name: Karem Tomaso MRN: [id number] Date of Birth: Sep 03, 1961

## 2020-10-26 ENCOUNTER — Other Ambulatory Visit: Payer: Self-pay

## 2020-10-26 ENCOUNTER — Ambulatory Visit: Payer: Medicare Other

## 2020-10-26 DIAGNOSIS — R471 Dysarthria and anarthria: Secondary | ICD-10-CM | POA: Diagnosis not present

## 2020-10-26 NOTE — Therapy (Signed)
[facility] [address] [place], [place], [address] Phone: [phone]   Fax:  [phone]  Speech Language Pathology Treatment  Patient Details  Name: Ian [name] MRN: [id number] Date of Birth: 03/31/1961 No data recorded  Encounter Date: 10/26/2020   End of Session - 10/26/20 1021    Visit Number 11    Number of Visits 17    Date for SLP Re-Evaluation 12/11/20    SLP Start Time 0935    SLP Stop Time  1016    SLP Time Calculation (min) 41 min    Activity Tolerance Patient tolerated treatment well           Past Medical History:  Diagnosis Date  . Anxiety   . History of kidney stones    passed  . PD (Parkinson's disease) (HCC) 09/10/2013  . PONV (postoperative nausea and vomiting)    nausea     Past Surgical History:  Procedure Laterality Date  . COLONOSCOPY    . MINOR PLACEMENT OF FIDUCIAL N/A 01/31/2017   Procedure: MINOR PLACEMENT OF FIDUCIAL;  Surgeon: Maeola Harman, MD;  Location: [facility] OR;  Service: Neurosurgery;  Laterality: N/A;  Fiducial placement  . none    . PULSE GENERATOR IMPLANT Right 02/17/2017   Procedure: Right implantable pulse generator placement;  Surgeon: Maeola Harman, MD;  Location: [facility] OR;  Service: Neurosurgery;  Laterality: Right;  Bilateral implantable pulse generator placement  . SUBTHALAMIC STIMULATOR BATTERY REPLACEMENT N/A 10/19/2018   Procedure: Deep Brain stimulator implantable pulse generator revision;  Surgeon: Maeola Harman, MD;  Location: [facility] OR;  Service: Neurosurgery;  Laterality: N/A;  Deep Brain stimulator implantable pulse generator revision  . SUBTHALAMIC STIMULATOR INSERTION Bilateral 02/10/2017   Procedure: Bilateral Deep brain stimulator placement;  Surgeon: Maeola Harman, MD;  Location: [facility] OR;  Service: Neurosurgery;  Laterality: Bilateral;  Bilateral Deep brain stimulator placement  . UPPER GASTROINTESTINAL ENDOSCOPY      There were no vitals filed for this visit.    Subjective Assessment - 10/26/20 0940    Subjective "Last Wednesday the big exercise class was back."    Currently in Pain? No/denies                 ADULT SLP TREATMENT - 10/26/20 0949      General Information   Behavior/Cognition Alert;Cooperative;Pleasant mood      Treatment Provided   Treatment provided Cognitive-Linquistic      Cognitive-Linquistic Treatment   Treatment focused on Dysarthria    Skilled Treatment SLP reviewed with Ian [name] the mechanics of over articulation to hit articulatory targets to slow his rate and improve intelligibility. SLP practiced with pt in conversational segments of 3-4 mintues x2. SLP reviewed each of these with pt with a digital recorder and pt told SLP what he did specifically to reduce his rate. Pt chose pausing between the words - SLP recorded these and played back for pt. He was unsatisfied with these responses. Pt then read the next two sentences and responded, focusing on opening his mouth. Pt was very satisifed with these trials. Pt stated, "I felt like I was talking exceedingly slow." SLP reminded pt that he will need to feel this in order for message to be understood. For the next 5 sets of sentences and responses, SLP recorded and played each back with min reinforcemtne/cueing that pt will need to feel like he is talking too slowly. Pt was satisfied with every trial after this remembrance of "I have to feel like  I'm talking too slowly." Pt to warm up with preposterous sentences with wife, and then have conversational segments of ~2 minutes in lentgh which he will record. He'll listen back on each segment and refocus on speech stratgies for the next segment.       Assessment / Recommendations / Plan   Plan Continue with current plan of care      Progression Toward Goals   Progression toward goals Progressing toward goals            SLP Education - 10/26/20 1020    Education Details feel like he's talking too slowly    Person(s) Educated  Patient    Methods Explanation;Demonstration    Comprehension Verbalized understanding;Need further instruction;Returned demonstration            SLP Short Term Goals - 10/21/20 1007      SLP SHORT TERM GOAL #1   Title pt will produce 19/20 sentences with reduced speech rate over 3 sessions    Baseline 10/07/20;10-12-20, 10-17-20    Status Achieved      SLP SHORT TERM GOAL #2   Title pt will demo slower speech producing >95% intelligible speech in 7 minutes simple conversation outside ST room in 2 sessions    Status Achieved      SLP SHORT TERM GOAL #3   Title pt will demo HEP for speech intelligibility with modified independence x3 sessions    Status Deferred   more focus on functional speech than drill in ST sessions           SLP Long Term Goals - 10/26/20 1309      SLP LONG TERM GOAL #1   Title pt will demo slower speech producing >95% intelligible speech in 15 minutes simple to mod complex conversation outside ST room x3 sessions    Time 3    Period Weeks   or 17 total sessions   Status On-going   from "10 minutes" to "15 mintues"     SLP LONG TERM GOAL #2   Title pt will report average no more than 5 requests of wife/friends/family asking him to repeat daily    Time 3    Period Weeks    Status On-going      SLP LONG TERM GOAL #3   Title pt will score no greater than 65 on speech related QOL measure during one of his last 3 sessions of ST    Time 3    Period Weeks    Status On-going      SLP LONG TERM GOAL #4   Title pt will report greater comfort at verbal communication in two situations he ID's he would like to be more successful with his speech    Time 3    Period Weeks    Status On-going            Plan - 10/26/20 1021    Clinical Impression Statement Ian "Ian [name]" arrives today presenting with cont'd rushed speech as a result of his Parkinsons Disease. Pt  stated he needs to talk "too slowly" in actively decr his rate and improve intelligibility.  See "skilled intervention" for more details. Pt would cont to benefit from skilled ST focusing on decr'ing rate of speech and thus improving intelligibilty in situations in which he feels less confident to communicate.    Speech Therapy Frequency 2x / week    Duration --   8 weeks or 17 visits   Treatment/Interventions Functional tasks;SLP instruction and  feedback;Compensatory strategies;Internal/external aids;Patient/family education    Potential to Achieve Goals Good           Patient will benefit from skilled therapeutic intervention in order to improve the following deficits and impairments:   Dysarthria and anarthria    Problem List Patient Active Problem List   Diagnosis Date Noted  . Left rotator cuff tear 08/20/2020  . Nonallopathic lesion of sacral region 07/25/2018  . Nonallopathic lesion of lumbosacral region 07/25/2018  . 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 Acute bursitis of right shoulder 12/06/2017  . Antalgic gait 01/24/2017  . Intercostal muscle tear 07/19/2016  . PD (Parkinson's disease) (HCC) 06/24/2016  . 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 Nonallopathic lesion of thoracic region 01/25/2016  . Nonallopathic lesion-rib cage 01/25/2016  . Nonallopathic lesion of cervical region 01/25/2016  . Parkinson's disease (HCC) 01/25/2016  . Lateral epicondylitis 12/01/2014  . REM behavioral disorder 08/27/2014  . Paralysis agitans (HCC) 10/18/2013    Ian [name] ,MS, CCC-SLP  10/26/2020, 1:09 PM  [facility] [address] [place], [place], [address] Phone: [phone]   Fax:  [phone]   Name: Ian [name] MRN: [id number] Date of Birth: 1961-05-25

## 2020-10-28 ENCOUNTER — Other Ambulatory Visit: Payer: Self-pay

## 2020-10-28 ENCOUNTER — Ambulatory Visit: Payer: Medicare Other

## 2020-10-28 DIAGNOSIS — R471 Dysarthria and anarthria: Secondary | ICD-10-CM | POA: Diagnosis not present

## 2020-10-28 NOTE — Therapy (Signed)
[facility] [address] [place], [place], [address] Phone: [phone]   Fax:  [phone]  Speech Language Pathology Treatment  Patient Details  Name: Ian [name] MRN: [id number] Date of Birth: 07-17-1961 No data recorded  Encounter Date: 10/28/2020   End of Session - 10/28/20 1131    Visit Number 12    Number of Visits 17    Date for SLP Re-Evaluation 12/11/20    SLP Start Time 0933    SLP Stop Time  1003    SLP Time Calculation (min) 30 min    Activity Tolerance Patient tolerated treatment well           Past Medical History:  Diagnosis Date  . Anxiety   . History of kidney stones    passed  . PD (Parkinson's disease) (HCC) 09/10/2013  . PONV (postoperative nausea and vomiting)    nausea     Past Surgical History:  Procedure Laterality Date  . COLONOSCOPY    . MINOR PLACEMENT OF FIDUCIAL N/A 01/31/2017   Procedure: MINOR PLACEMENT OF FIDUCIAL;  Surgeon: Maeola Harman, MD;  Location: [facility] OR;  Service: Neurosurgery;  Laterality: N/A;  Fiducial placement  . none    . PULSE GENERATOR IMPLANT Right 02/17/2017   Procedure: Right implantable pulse generator placement;  Surgeon: Maeola Harman, MD;  Location: [facility] OR;  Service: Neurosurgery;  Laterality: Right;  Bilateral implantable pulse generator placement  . SUBTHALAMIC STIMULATOR BATTERY REPLACEMENT N/A 10/19/2018   Procedure: Deep Brain stimulator implantable pulse generator revision;  Surgeon: Maeola Harman, MD;  Location: [facility] OR;  Service: Neurosurgery;  Laterality: N/A;  Deep Brain stimulator implantable pulse generator revision  . SUBTHALAMIC STIMULATOR INSERTION Bilateral 02/10/2017   Procedure: Bilateral Deep brain stimulator placement;  Surgeon: Maeola Harman, MD;  Location: [facility] OR;  Service: Neurosurgery;  Laterality: Bilateral;  Bilateral Deep brain stimulator placement  . UPPER GASTROINTESTINAL ENDOSCOPY      There were no vitals filed for this visit.    Subjective Assessment - 10/28/20 0938    Currently in Pain? Yes    Pain Score 5     Pain Location Neck    Pain Orientation Right    Pain Descriptors / Indicators Sore    Pain Type Acute pain    Pain Onset Yesterday    Pain Frequency Constant    Aggravating Factors  nothing    Pain Relieving Factors nothing                 ADULT SLP TREATMENT - 10/28/20 0939      General Information   Behavior/Cognition Alert;Cooperative;Pleasant mood      Treatment Provided   Treatment provided Cognitive-Linquistic      Cognitive-Linquistic Treatment   Treatment focused on Dysarthria    Skilled Treatment Pt asked to leave 15 minutes early to attend PD exercise class. "My wife said I was doing better." SLP recorded two sets of 2-sentence responses - one with pt thinking of pausing and overarticulation and the other set with thinkging about "talking too slowly" with essentially teh same results. Pt stated it was easeir for him to think about talking slower than the pausing and overarticulating. Pt to focus his practice and conversations on talking like he thinks he is talking too slowly.      Assessment / Recommendations / Plan   Plan Continue with current plan of care      Progression Toward Goals   Progression toward goals Progressing toward goals  SLP Short Term Goals - 10/21/20 1007      SLP SHORT TERM GOAL #1   Title pt will produce 19/20 sentences with reduced speech rate over 3 sessions    Baseline 10/07/20;10-12-20, 10-17-20    Status Achieved      SLP SHORT TERM GOAL #2   Title pt will demo slower speech producing >95% intelligible speech in 7 minutes simple conversation outside ST room in 2 sessions    Status Achieved      SLP SHORT TERM GOAL #3   Title pt will demo HEP for speech intelligibility with modified independence x3 sessions    Status Deferred   more focus on functional speech than drill in ST sessions           SLP Long Term Goals -  10/28/20 1132      SLP LONG TERM GOAL #1   Title pt will demo slower speech producing >95% intelligible speech in 15 minutes simple to mod complex conversation outside ST room x3 sessions    Time 3    Period Weeks   or 17 total sessions   Status On-going   from "10 minutes" to "15 mintues"     SLP LONG TERM GOAL #2   Title pt will report average no more than 5 requests of wife/friends/family asking him to repeat daily    Time 3    Period Weeks    Status On-going      SLP LONG TERM GOAL #3   Title pt will score no greater than 65 on speech related QOL measure during one of his last 3 sessions of ST    Time 3    Period Weeks    Status On-going      SLP LONG TERM GOAL #4   Title pt will report greater comfort at verbal communication in two situations he ID's he would like to be more successful with his speech    Time 3    Period Weeks    Status On-going            Plan - 10/28/20 1131    Clinical Impression Statement Ian "[name]" arrives today presenting with cont'd rushed speech as a result of his Parkinsons Disease. Pt stated focus on talking "too slowly" was effective at reducing his rate to an acceptable rate. SLP agreed. See "skilled intervention" for more details. Pt would cont to benefit from skilled ST focusing on decr'ing rate of speech and thus improving intelligibilty in situations in which he feels less confident to communicate.    Speech Therapy Frequency 2x / week    Duration --   8 weeks or 17 visits   Treatment/Interventions Functional tasks;SLP instruction and feedback;Compensatory strategies;Internal/external aids;Patient/family education    Potential to Achieve Goals Good           Patient will benefit from skilled therapeutic intervention in order to improve the following deficits and impairments:   Dysarthria and anarthria    Problem List Patient Active Problem List   Diagnosis Date Noted  . Left rotator cuff tear 08/20/2020  . Nonallopathic  lesion of sacral region 07/25/2018  . Nonallopathic lesion of lumbosacral region 07/25/2018  . 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 Acute bursitis of right shoulder 12/06/2017  . Antalgic gait 01/24/2017  . Intercostal muscle tear 07/19/2016  . PD (Parkinson's disease) (HCC) 06/24/2016  . 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 Nonallopathic lesion of thoracic region 01/25/2016  . Nonallopathic lesion-rib cage 01/25/2016  .  Nonallopathic lesion of cervical region 01/25/2016  . Parkinson's disease (HCC) 01/25/2016  . Lateral epicondylitis 12/01/2014  . REM behavioral disorder 08/27/2014  . Paralysis agitans (HCC) 10/18/2013    Ian [name] ,MS, CCC-SLP  10/28/2020, 11:32 AM  [facility] [address] [place], [place], [address] Phone: [phone]   Fax:  [phone]   Name: Ian [name] MRN: [id number] Date of Birth: Dec 07, 1961

## 2020-11-02 ENCOUNTER — Ambulatory Visit: Payer: Medicare Other

## 2020-11-02 ENCOUNTER — Other Ambulatory Visit: Payer: Self-pay

## 2020-11-02 DIAGNOSIS — R471 Dysarthria and anarthria: Secondary | ICD-10-CM

## 2020-11-02 NOTE — Therapy (Signed)
[facility] [address] [place], [place], [address] Phone: [phone]   Fax:  [phone]  Speech Language Pathology Treatment  Patient Details  Name: Ian [name] MRN: [id number] Date of Birth: Jun 24, 1961 No data recorded  Encounter Date: 11/02/2020   End of Session - 11/02/20 1015    Visit Number 13    Number of Visits 17    Date for SLP Re-Evaluation 12/11/20    SLP Start Time 0935    SLP Stop Time  1016    SLP Time Calculation (min) 41 min    Activity Tolerance Patient tolerated treatment well           Past Medical History:  Diagnosis Date  . Anxiety   . History of kidney stones    passed  . PD (Parkinson's disease) (HCC) 09/10/2013  . PONV (postoperative nausea and vomiting)    nausea     Past Surgical History:  Procedure Laterality Date  . COLONOSCOPY    . MINOR PLACEMENT OF FIDUCIAL N/A 01/31/2017   Procedure: MINOR PLACEMENT OF FIDUCIAL;  Surgeon: Maeola Harman, MD;  Location: [facility] OR;  Service: Neurosurgery;  Laterality: N/A;  Fiducial placement  . none    . PULSE GENERATOR IMPLANT Right 02/17/2017   Procedure: Right implantable pulse generator placement;  Surgeon: Maeola Harman, MD;  Location: [facility] OR;  Service: Neurosurgery;  Laterality: Right;  Bilateral implantable pulse generator placement  . SUBTHALAMIC STIMULATOR BATTERY REPLACEMENT N/A 10/19/2018   Procedure: Deep Brain stimulator implantable pulse generator revision;  Surgeon: Maeola Harman, MD;  Location: [facility] OR;  Service: Neurosurgery;  Laterality: N/A;  Deep Brain stimulator implantable pulse generator revision  . SUBTHALAMIC STIMULATOR INSERTION Bilateral 02/10/2017   Procedure: Bilateral Deep brain stimulator placement;  Surgeon: Maeola Harman, MD;  Location: [facility] OR;  Service: Neurosurgery;  Laterality: Bilateral;  Bilateral Deep brain stimulator placement  . UPPER GASTROINTESTINAL ENDOSCOPY      There were no vitals filed for this visit.    Subjective Assessment - 11/02/20 0943    Subjective "Much better - pt did not use "too slow" talking yesterday atter [employer] and had x2 requests for repeats over ~2 hours.    Currently in Pain? No/denies                 ADULT SLP TREATMENT - 11/02/20 0946      General Information   Behavior/Cognition Alert;Cooperative;Pleasant mood      Treatment Provided   Treatment provided Cognitive-Linquistic      Cognitive-Linquistic Treatment   Treatment focused on Dysarthria    Skilled Treatment "I'm going to try this week to think about talking too slowly all the time so I can really get into the habit of it." Pt would like to habitualize "talking too slow" and have people ask him, less often, to repeat instead of talking at his normal rate (too fast) and have pepole ask him to repeat. SLP spoke with pt about an external cue/s to assist pt in remembering throughout the day to speak "too slowly." In short conversational segments of 3-5 minutes in length, pt was successful in maintaining slower rate.      Assessment / Recommendations / Plan   Plan Continue with current plan of care      Progression Toward Goals   Progression toward goals Progressing toward goals            SLP Education - 11/02/20 1015    Education Details external cue/s for rate  reduction    Person(s) Educated Patient    Methods Explanation    Comprehension Verbalized understanding            SLP Short Term Goals - 10/21/20 1007      SLP SHORT TERM GOAL #1   Title pt will produce 19/20 sentences with reduced speech rate over 3 sessions    Baseline 10/07/20;10-12-20, 10-17-20    Status Achieved      SLP SHORT TERM GOAL #2   Title pt will demo slower speech producing >95% intelligible speech in 7 minutes simple conversation outside ST room in 2 sessions    Status Achieved      SLP SHORT TERM GOAL #3   Title pt will demo HEP for speech intelligibility with modified independence x3 sessions    Status  Deferred   more focus on functional speech than drill in ST sessions           SLP Long Term Goals - 11/02/20 1146      SLP LONG TERM GOAL #1   Title pt will demo slower speech producing >95% intelligible speech in 15 minutes simple to mod complex conversation outside ST room x3 sessions    Baseline 11-02-20    Time 2    Period Weeks   or 17 total sessions   Status On-going   from "10 minutes" to "15 mintues"     SLP LONG TERM GOAL #2   Title pt will report average no more than 5 requests of wife/friends/family asking him to repeat daily    Time 2    Period Weeks    Status On-going      SLP LONG TERM GOAL #3   Title pt will score no greater than 65 on speech related QOL measure during one of his last 3 sessions of ST    Time 2    Period Weeks    Status On-going      SLP LONG TERM GOAL #4   Title pt will report greater comfort at verbal communication in two situations he ID's he would like to be more successful with his speech    Time 2    Period Weeks    Status On-going            Plan - 11/02/20 1144    Clinical Impression Statement Ian "[name]" arrives today presenting with better speech rate - previously presented with overly fast rate of speech. Pt stated focus on talking "too slowly" was effective over the weekend at reducing his rate to an acceptable rate, but he was . SLP agreed. See "skilled intervention" for more details. Pt would cont to benefit from skilled ST focusing on decr'ing rate of speech and thus improving intelligibilty in situations in which he feels less confident to communicate.    Speech Therapy Frequency 2x / week    Duration --   8 weeks or 17 sessions   Treatment/Interventions Functional tasks;SLP instruction and feedback;Compensatory strategies;Internal/external aids;Patient/family education    Potential to Achieve Goals Good    Consulted and Agree with Plan of Care Patient           Patient will benefit from skilled therapeutic  intervention in order to improve the following deficits and impairments:   Dysarthria and anarthria    Problem List Patient Active Problem List   Diagnosis Date Noted  . Left rotator cuff tear 08/20/2020  . Nonallopathic lesion of sacral region 07/25/2018  . Nonallopathic lesion of lumbosacral region 07/25/2018  .  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 Acute bursitis of right shoulder 12/06/2017  . Antalgic gait 01/24/2017  . Intercostal muscle tear 07/19/2016  . PD (Parkinson's disease) (HCC) 06/24/2016  . 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 Nonallopathic lesion of thoracic region 01/25/2016  . Nonallopathic lesion-rib cage 01/25/2016  . Nonallopathic lesion of cervical region 01/25/2016  . Parkinson's disease (HCC) 01/25/2016  . Lateral epicondylitis 12/01/2014  . REM behavioral disorder 08/27/2014  . Paralysis agitans (HCC) 10/18/2013    Brick Ketcher ,MS, CCC-SLP  11/02/2020, 11:46 AM  [place] [name] [address] [place], [place], [address] Phone: [phone]   Fax:  [phone]   Name: Caidence Higashi MRN: [id number] Date of Birth: Mar 19, 1961

## 2020-11-02 NOTE — Patient Instructions (Signed)
° °  Think of an external cue or cues that you can use to remind you to keep that rate SLOWER than normal.

## 2020-11-04 ENCOUNTER — Other Ambulatory Visit: Payer: Self-pay

## 2020-11-04 ENCOUNTER — Ambulatory Visit: Payer: Medicare Other | Admitting: Speech Pathology

## 2020-11-04 ENCOUNTER — Encounter: Payer: Self-pay | Admitting: Speech Pathology

## 2020-11-04 DIAGNOSIS — R471 Dysarthria and anarthria: Secondary | ICD-10-CM | POA: Diagnosis not present

## 2020-11-04 NOTE — Therapy (Signed)
[facility] [address] [place], [place], [address] Phone: [phone]   Fax:  [phone]  Speech Language Pathology Treatment  Patient Details  Name: Ian [name] MRN: [id number] Date of Birth: November 19, 1961 No data recorded  Encounter Date: 11/04/2020   End of Session - 11/04/20 1005    Visit Number 14    Number of Visits 17    Date for SLP Re-Evaluation 12/11/20    SLP Start Time 0932    SLP Stop Time  1002   kenny left early to attend POP   SLP Time Calculation (min) 30 min    Activity Tolerance Patient tolerated treatment well           Past Medical History:  Diagnosis Date  . Anxiety   . History of kidney stones    passed  . PD (Parkinson's disease) (HCC) 09/10/2013  . PONV (postoperative nausea and vomiting)    nausea     Past Surgical History:  Procedure Laterality Date  . COLONOSCOPY    . MINOR PLACEMENT OF FIDUCIAL N/A 01/31/2017   Procedure: MINOR PLACEMENT OF FIDUCIAL;  Surgeon: Maeola Harman, MD;  Location: [facility] OR;  Service: Neurosurgery;  Laterality: N/A;  Fiducial placement  . none    . PULSE GENERATOR IMPLANT Right 02/17/2017   Procedure: Right implantable pulse generator placement;  Surgeon: Maeola Harman, MD;  Location: [facility] OR;  Service: Neurosurgery;  Laterality: Right;  Bilateral implantable pulse generator placement  . SUBTHALAMIC STIMULATOR BATTERY REPLACEMENT N/A 10/19/2018   Procedure: Deep Brain stimulator implantable pulse generator revision;  Surgeon: Maeola Harman, MD;  Location: [facility] OR;  Service: Neurosurgery;  Laterality: N/A;  Deep Brain stimulator implantable pulse generator revision  . SUBTHALAMIC STIMULATOR INSERTION Bilateral 02/10/2017   Procedure: Bilateral Deep brain stimulator placement;  Surgeon: Maeola Harman, MD;  Location: [facility] OR;  Service: Neurosurgery;  Laterality: Bilateral;  Bilateral Deep brain stimulator placement  . UPPER GASTROINTESTINAL ENDOSCOPY      There were no  vitals filed for this visit.   Subjective Assessment - 11/04/20 0935    Subjective "I have to leave early if that's OK"    Currently in Pain? No/denies                 ADULT SLP TREATMENT - 11/04/20 0936      General Information   Behavior/Cognition Alert;Cooperative;Pleasant mood      Treatment Provided   Treatment provided Cognitive-Linquistic      Cognitive-Linquistic Treatment   Treatment focused on Dysarthria;Patient/family/caregiver education    Skilled Treatment I find myself slipping as a conversation goes. Ian [name] verbalized that when he talks to someone who talks fast he  tends to speed up.  Re-inforced this awareness and generated strategy to use self talk to be aware of how your communication partner if affecting your rate of speech.  In conversation, Ian [name] required occasional min A to reduce rate.  Ian [name] judges his rate of speech accurately and is able to reduce rate when he needs to.      Assessment / Recommendations / Plan   Plan Continue with current plan of care      Progression Toward Goals   Progression toward goals Progressing toward goals            SLP Education - 11/04/20 1002    Education Details external cues for rate reduction, pay attention to how communiation partner affects your speech,    Person(s) Educated Patient    Methods Explanation;Verbal  cues;Handout    Comprehension Verbalized understanding            SLP Short Term Goals - 11/04/20 1004      SLP SHORT TERM GOAL #1   Title pt will produce 19/20 sentences with reduced speech rate over 3 sessions    Baseline 10/07/20;10-12-20, 10-17-20    Status Achieved      SLP SHORT TERM GOAL #2   Title pt will demo slower speech producing >95% intelligible speech in 7 minutes simple conversation outside ST room in 2 sessions    Status Achieved      SLP SHORT TERM GOAL #3   Title pt will demo HEP for speech intelligibility with modified independence x3 sessions    Status Deferred   more  focus on functional speech than drill in ST sessions           SLP Long Term Goals - 11/04/20 1004      SLP LONG TERM GOAL #1   Title pt will demo slower speech producing >95% intelligible speech in 15 minutes simple to mod complex conversation outside ST room x3 sessions    Baseline 11-02-20; 11-04-20    Time 2    Period Weeks   or 17 total sessions   Status Achieved   from "10 minutes" to "15 mintues"     SLP LONG TERM GOAL #2   Title pt will report average no more than 5 requests of wife/friends/family asking him to repeat daily    Time 1    Period Weeks    Status On-going      SLP LONG TERM GOAL #3   Title pt will score no greater than 65 on speech related QOL measure during one of his last 3 sessions of ST    Time 1    Period Weeks    Status On-going      SLP LONG TERM GOAL #4   Title pt will report greater comfort at verbal communication in two situations he ID's he would like to be more successful with his speech    Time 1    Period Weeks    Status On-going            Plan - 11/04/20 1003    Clinical Impression Statement Ian "Ian [name]" arrives today presenting with better speech rate - previously presented with overly fast rate of speech. Pt stated focus on talking "too slowly" was effective over the weekend at reducing his rate to an acceptable rate, but he was . SLP agreed. See "skilled intervention" for more details. Pt would cont to benefit from skilled ST focusing on decr'ing rate of speech and thus improving intelligibilty in situations in which he feels less confident to communicate.    Speech Therapy Frequency 2x / week    Duration --   8 weeks or 17 visits   Treatment/Interventions Functional tasks;SLP instruction and feedback;Compensatory strategies;Internal/external aids;Patient/family education    Potential to Achieve Goals Good           Patient will benefit from skilled therapeutic intervention in order to improve the following deficits and  impairments:   Dysarthria and anarthria    Problem List Patient Active Problem List   Diagnosis Date Noted  . Left rotator cuff tear 08/20/2020  . Nonallopathic lesion of sacral region 07/25/2018  . Nonallopathic lesion of lumbosacral region 07/25/2018  . 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 Acute bursitis of right shoulder 12/06/2017  . Antalgic gait 01/24/2017  .  Intercostal muscle tear 07/19/2016  . PD (Parkinson's disease) (HCC) 06/24/2016  . 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 Nonallopathic lesion of thoracic region 01/25/2016  . Nonallopathic lesion-rib cage 01/25/2016  . Nonallopathic lesion of cervical region 01/25/2016  . Parkinson's disease (HCC) 01/25/2016  . Lateral epicondylitis 12/01/2014  . REM behavioral disorder 08/27/2014  . Paralysis agitans (HCC) 10/18/2013    Donnika Kucher, Radene Journey MS, CCC-SLP 11/04/2020, 10:05 AM   [facility] [address] [place], [place], [address] Phone: [phone]   Fax:  [phone]   Name: Owen Pratte MRN: [id number] Date of Birth: 08/13/1961

## 2020-11-04 NOTE — Patient Instructions (Signed)
   Great job paying attention to how your communication partner affect your rate of speech  Keep saying what you are going to say, even if you are interrupted - keep going  Use the wrist band to remind you

## 2020-11-09 ENCOUNTER — Other Ambulatory Visit: Payer: Self-pay

## 2020-11-09 ENCOUNTER — Ambulatory Visit: Payer: Medicare Other | Admitting: Speech Pathology

## 2020-11-09 ENCOUNTER — Encounter: Payer: Self-pay | Admitting: Speech Pathology

## 2020-11-09 DIAGNOSIS — R471 Dysarthria and anarthria: Secondary | ICD-10-CM | POA: Diagnosis not present

## 2020-11-09 NOTE — Therapy (Signed)
[facility] [address] [place], [place], [address] Phone: [phone]   Fax:  [phone]  Speech Language Pathology Treatment  Patient Details  Name: Ian [name] MRN: [id number] Date of Birth: [birth year] No data recorded  Encounter Date: 11/09/2020    Past Medical History:  Diagnosis Date  . Anxiety   . History of kidney stones    passed  . PD (Parkinson's disease) (HCC) 09/10/2013  . PONV (postoperative nausea and vomiting)    nausea     Past Surgical History:  Procedure Laterality Date  . COLONOSCOPY    . MINOR PLACEMENT OF FIDUCIAL N/A 01/31/2017   Procedure: MINOR PLACEMENT OF FIDUCIAL;  Surgeon: Maeola Harman, MD;  Location: [facility] OR;  Service: Neurosurgery;  Laterality: N/A;  Fiducial placement  . none    . PULSE GENERATOR IMPLANT Right 02/17/2017   Procedure: Right implantable pulse generator placement;  Surgeon: Maeola Harman, MD;  Location: [facility] OR;  Service: Neurosurgery;  Laterality: Right;  Bilateral implantable pulse generator placement  . SUBTHALAMIC STIMULATOR BATTERY REPLACEMENT N/A 10/19/2018   Procedure: Deep Brain stimulator implantable pulse generator revision;  Surgeon: Maeola Harman, MD;  Location: [facility] OR;  Service: Neurosurgery;  Laterality: N/A;  Deep Brain stimulator implantable pulse generator revision  . SUBTHALAMIC STIMULATOR INSERTION Bilateral 02/10/2017   Procedure: Bilateral Deep brain stimulator placement;  Surgeon: Maeola Harman, MD;  Location: [facility] OR;  Service: Neurosurgery;  Laterality: Bilateral;  Bilateral Deep brain stimulator placement  . UPPER GASTROINTESTINAL ENDOSCOPY      There were no vitals filed for this visit.   Subjective Assessment - 11/09/20 0938    Subjective "I'm doing good"    Currently in Pain? No/denies                 ADULT SLP TREATMENT - 11/09/20 0939      General Information   Behavior/Cognition Alert;Cooperative;Pleasant mood      Treatment  Provided   Treatment provided Cognitive-Linquistic      Cognitive-Linquistic Treatment   Treatment focused on Dysarthria;Patient/family/caregiver education    Skilled Treatment Pt filled out Speech QOL survey - he scored 54. Ian [name] reports his wife asks him to repeat himself 1-2x a day.  Walking outside and conversing, Ian [name] was 100%. Ian [name] reports he is feeling more "natural" using a reduced rate. Ian [name] is using a bracelet as external reminder to reduce rate. Instructed Ian [name] to be mindful  at Thanksgiving and around people who talk fast.       Assessment / Recommendations / Plan   Plan Continue with current plan of care      Progression Toward Goals   Progression toward goals Progressing toward goals            SLP Education - 11/09/20 1010    Education Details external cues for rate reduction and over articulation; intenal awareness of how communication partner affects rate of speech    Person(s) Educated Patient    Methods Explanation;Verbal cues    Comprehension Verbalized understanding;Returned demonstration            SLP Short Term Goals - 11/09/20 1019      SLP SHORT TERM GOAL #1   Title pt will produce 19/20 sentences with reduced speech rate over 3 sessions    Baseline 10/07/20;10-12-20, 10-17-20    Status Achieved      SLP SHORT TERM GOAL #2   Title pt will demo slower speech producing >95% intelligible speech in 7 minutes simple conversation  outside ST room in 2 sessions    Status Achieved      SLP SHORT TERM GOAL #3   Title pt will demo HEP for speech intelligibility with modified independence x3 sessions    Status Deferred   more focus on functional speech than drill in ST sessions           SLP Long Term Goals - 11/09/20 1019      SLP LONG TERM GOAL #1   Title pt will demo slower speech producing >95% intelligible speech in 15 minutes simple to mod complex conversation outside ST room x3 sessions    Baseline 11-02-20; 11-04-20    Time 2    Period  Weeks   or 17 total sessions   Status Achieved   from "10 minutes" to "15 mintues"     SLP LONG TERM GOAL #2   Title pt will report average no more than 5 requests of wife/friends/family asking him to repeat daily    Time 1    Period Weeks    Status On-going      SLP LONG TERM GOAL #3   Title pt will score no greater than 65 on speech related QOL measure during one of his last 3 sessions of ST    Time 1    Period Weeks    Status Achieved      SLP LONG TERM GOAL #4   Title pt will report greater comfort at verbal communication in two situations he ID's he would like to be more successful with his speech    Time 1    Period Weeks    Status On-going            Plan - 11/09/20 1011    Clinical Impression Statement Ian [name] arrtives with WNL speech rate. He reports sucess carrying over slow rate and over artciulation at home and in community. He is using external reminder (rubber bracelet) to A with carryover of strategies. Continue skilled ST to maximize intelligilbity across community settings    Speech Therapy Frequency 2x / week    Duration 8 weeks   or 17 visits   Treatment/Interventions Functional tasks;SLP instruction and feedback;Compensatory strategies;Internal/external aids;Patient/family education    Potential to Achieve Goals Good           Patient will benefit from skilled therapeutic intervention in order to improve the following deficits and impairments:   Dysarthria and anarthria    Problem List Patient Active Problem List   Diagnosis Date Noted  . Left rotator cuff tear 08/20/2020  . Nonallopathic lesion of sacral region 07/25/2018  . Nonallopathic lesion of lumbosacral region 07/25/2018  . 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 Acute bursitis of right shoulder 12/06/2017  . Antalgic gait 01/24/2017  . Intercostal muscle tear 07/19/2016  . PD (Parkinson's disease) (HCC) 06/24/2016  . 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 Nonallopathic lesion of  thoracic region 01/25/2016  . Nonallopathic lesion-rib cage 01/25/2016  . Nonallopathic lesion of cervical region 01/25/2016  . Parkinson's disease (HCC) 01/25/2016  . Lateral epicondylitis 12/01/2014  . REM behavioral disorder 08/27/2014  . Paralysis agitans (HCC) 10/18/2013    Ralpheal Zappone, Radene Journey MS, CCC-SLP 11/09/2020, 10:20 AM  [facility] [address] [place], [place], [address] Phone: [phone]   Fax:  [phone]   Name: Ian [name] MRN: [id number] Date of Birth: 10-16-1961

## 2020-11-16 ENCOUNTER — Ambulatory Visit: Payer: Medicare Other

## 2020-11-16 ENCOUNTER — Encounter: Payer: Self-pay | Admitting: Family Medicine

## 2020-11-17 NOTE — Progress Notes (Signed)
Assessment/Plan:   1.  Parkinsons Disease  -The patient underwent bilateral STN DBS with [employer] device on 02/10/2017 and had IPG placement on 02/17/2017.He did have his battery changed on October 19, 2018 to an MRI compatible battery.  -DBS adjusted today -Continue [name], 95 mg, 2 tablets 4 times per day  -Having wearing off and I really would like him to try opicapone, 50 mg nightly.  Samples provided.  -Patient's forms filled out today. 2. Fatigue  -Ultimately, this is likely related to the disease, but I do not think that there are any medications that are going to help. We have tried Provigil, Nuvigil and selegiline, all without relief. He is exercising. 3. REM behavior disorder. -On clonazepam 0.5 mg, half tablet at night.  PDMP has been reviewed.  No red flags.   Subjective:   Ian [name] was seen today in follow up for Parkinsons disease.  My previous records were reviewed prior to todays visit as well as outside records available to me. Pt denies falls.  Pt denies lightheadedness, near syncope.  No hallucinations.  Mood has been good.  He has tried experimenting somewhat with the [name] dosage, taking 3 tablets in the morning or 3 tablets at bedtime, because daylight savings time seems to get him off of schedule every year.  However, that did not really seem to help and he went back to 2 tablets 4 times per day.  He states that after he takes his morning medication, he is on for about 3 or 4 hours and then has off periods for about 2 hours, where he has foot tremor, slowness and stiffness and more fatigue.  He never has freezing.  Speech therapy notes have been reviewed since our last visit.  Patient has also seen Dr. Katrinka Blazing for left shoulder pain.  Current prescribed movement disorder medications: [name] 95 mg,2 tablets 4 times per day  Clonazepam 0.5 mg, half tablet at night   PREVIOUS MEDICATIONS:Requip;Neupro  (tried only a few days); propranolol helped tremor some; Inbrija (samples given but not taken); Provigil; Nuvigil; selegiline (no help for fatigue)   ALLERGIES:  No Known Allergies  CURRENT MEDICATIONS:  Outpatient Encounter Medications as of 11/23/2020  Medication Sig  . Ascorbic Acid (VITAMIN C PO) Take 3 tablets by mouth 3 (three) times daily.   [name] b complex vitamins tablet Take 1 tablet by mouth 2 (two) times daily.  . Carbidopa-Levodopa ER ([name]) 23.75-95 MG CPCR Take 2 capsules by mouth 4 (four) times daily.  . clonazePAM (KLONOPIN) 0.5 MG tablet TAKE 1/2 TABLET(0.25 MG) BY MOUTH AT BEDTIME  . Melatonin 3 MG CAPS Take 3 mg by mouth at bedtime.  . [DISCONTINUED] Carbidopa-Levodopa ER ([name]) 23.75-95 MG CPCR Take 2 capsules by mouth 4 (four) times daily.  . [DISCONTINUED] ibuprofen (ADVIL,MOTRIN) 200 MG tablet Take 400 mg by mouth every 6 (six) hours as needed for headache or moderate pain. (Patient not taking: Reported on 11/23/2020)   No facility-administered encounter medications on file as of 11/23/2020.    Objective:   PHYSICAL EXAMINATION:    VITALS:   Vitals:   11/23/20 1116  BP: 137/77  Pulse: 84  SpO2: 100%  Weight: 171 lb (77.6 kg)  Height: 6\' 2"  (1.88 m)    GEN:  The patient appears stated age and is in NAD. HEENT:  Normocephalic, atraumatic.    Neurological examination:  Orientation: The patient is alert and oriented x3. Cranial nerves: There is good facial symmetry with minimal facial hypomimia.  The speech is fluent and clear. Soft palate rises symmetrically and there is no tongue deviation. Hearing is intact to conversational tone. Sensation: Sensation is intact to light touch throughout Motor: Strength is at least antigravity x4.  Movement examination: Tone: There is mild to moderate increased tone in the right upper extremity and mild in the left upper extremity.   Abnormal movements: There is intermittent, independent, bilateral lower extremity rest  tremor. Coordination:  There is no decremation with RAM's, Gait and Station: The patient is able to ambulate well.  I have reviewed and interpreted the following labs independently    Chemistry      Component Value Date/Time   NA 141 10/15/2018 1533   K 4.4 10/15/2018 1533   CL 105 10/15/2018 1533   CO2 28 10/15/2018 1533   BUN 16 10/15/2018 1533   CREATININE 1.05 10/15/2018 1533   CREATININE 0.91 11/25/2014 1051      Component Value Date/Time   CALCIUM 9.7 10/15/2018 1533   ALKPHOS 69 06/10/2015 1155   AST 27 06/10/2015 1155   ALT 40 06/10/2015 1155   BILITOT 1.0 06/10/2015 1155       Lab Results  Component Value Date   WBC 6.5 10/15/2018   HGB 15.4 10/15/2018   HCT 48.2 10/15/2018   MCV 91.6 10/15/2018   PLT 302 10/15/2018    No results found for: TSH   Total time spent on today's visit was 30 minutes, including both face-to-face time and nonface-to-face time.  Time included that spent on review of records (prior notes available to me/labs/imaging if pertinent), discussing treatment and goals, answering patient's questions and coordinating care.  This did not include DBS time, which is on a separate programming procedural note.  Cc:  Tracey Harries, MD

## 2020-11-18 ENCOUNTER — Ambulatory Visit: Payer: Medicare Other | Admitting: Family Medicine

## 2020-11-23 ENCOUNTER — Encounter: Payer: Self-pay | Admitting: Neurology

## 2020-11-23 ENCOUNTER — Other Ambulatory Visit: Payer: Self-pay

## 2020-11-23 ENCOUNTER — Ambulatory Visit (INDEPENDENT_AMBULATORY_CARE_PROVIDER_SITE_OTHER): Payer: Medicare Other | Admitting: Neurology

## 2020-11-23 DIAGNOSIS — G20 Parkinson's disease: Secondary | ICD-10-CM | POA: Diagnosis not present

## 2020-11-23 MED ORDER — RYTARY 23.75-95 MG PO CPCR: 2.0000 | ORAL_CAPSULE | Freq: Four times a day (QID) | ORAL | 1 refills | Status: DC

## 2020-11-23 MED ORDER — ONGENTYS 50 MG PO CAPS: 1.0000 | ORAL_CAPSULE | Freq: Every day | ORAL | 0 refills | Status: DC

## 2020-11-23 NOTE — Progress Notes (Signed)
Ian [name] [address] [place] [address] Phone: [phone] Subjective:   I Ian [name] am serving as a Neurosurgeon for Dr. Antoine Primas.  This visit occurred during the SARS-CoV-2 public health emergency.  Safety protocols were in place, including screening questions prior to the visit, additional usage of staff PPE, and extensive cleaning of exam room while observing appropriate contact time as indicated for disinfecting solutions.   I'm seeing this patient by the request  of:  Ian Harries, MD  CC: Neck and back pain follow-up  [id number]  Ian [name] is a 59 y.o. male coming in with complaint of back and neck pain. OMT 10/06/2020. Patient states he is doing well and making progress.  Patient is very mild discomfort from time to time.  Nothing as severe as what it was previously.  Making progress he would stay.  We will try to stay active.  Still having some mild left shoulder pain but has made more improvement          Reviewed prior external information including notes and imaging from previsou exam, outside providers and external EMR if available.   As well as notes that were available from care everywhere and other healthcare systems.  Past medical history, social, surgical and family history all reviewed in electronic medical record.  No pertanent information unless stated regarding to the chief complaint.   Past Medical History:  Diagnosis Date  . Anxiety   . History of kidney stones    passed  . PD (Parkinson's disease) (HCC) 09/10/2013  . PONV (postoperative nausea and vomiting)    nausea     No Known Allergies   Review of Systems:  No headache, visual changes, nausea, vomiting, diarrhea, constipation, dizziness, abdominal pain, skin rash, fevers, chills, night sweats, weight loss, swollen lymph nodes, body aches, joint swelling, chest pain, shortness of breath, mood changes. POSITIVE muscle aches  Objective  Blood  pressure 126/80, pulse 75, height 6\' 2"  (1.88 m), weight 170 lb (77.1 kg), SpO2 98 %.   General: No apparent distress alert and oriented x3 mood and affect normal, dressed appropriately.  Patient has a very minimal resting tremor noted of the left lower extremity HEENT: Pupils equal, extraocular movements intact  Respiratory: Patient's speak in full sentences and does not appear short of breath  Cardiovascular: No lower extremity edema, non tender, no erythema  Neuro: Cranial nerves II through XII are intact, neurovascularly intact in all extremities with 2+ DTRs and 2+ pulses.  Gait normal with good balance and coordination.  MSK: Mild hypertonicity noted. Back -hypertonicity of the back musculature noted.  Patient does have tightness with FABER test bilaterally right greater than left.  Some mild tightness noted as well.  Osteopathic findings  C3 flexed rotated and side bent right C6 flexed rotated and side bent left T3 extended rotated and side bent right inhaled rib T8 extended rotated and side bent left L2 flexed rotated and side bent right Sacrum right on right       Assessment and Plan:    Nonallopathic problems  Decision today to treat with OMT was based on Physical Exam  After verbal consent patient was treated with HVLA, ME, FPR techniques in cervical, rib, thoracic, lumbar, and sacral  areas  Patient tolerated the procedure well with improvement in symptoms  Patient given exercises, stretches and lifestyle modifications  See medications in patient instructions if given  Patient will follow up in 4-8 weeks  The above documentation has been reviewed and is accurate and complete Ian Pulley, DO       Note: This dictation was prepared with Dragon dictation along with smaller phrase technology. Any transcriptional errors that result from this process are unintentional.

## 2020-11-23 NOTE — Patient Instructions (Signed)
1.  Start opicapone 50 mg nightly 2.  Continue rytary, 2 tablets 4 times per day 3.  Let me know if you need clonazepam  The physicians and staff at [facility] are committed to providing excellent care. You may receive a survey requesting feedback about your experience at our office. We strive to receive "very good" responses to the survey questions. If you feel that your experience would prevent you from giving the office a "very good " response, please contact our office to try to remedy the situation. We may be reached at [phone]. Thank you for taking the time out of your busy day to complete the survey.

## 2020-11-23 NOTE — Procedures (Signed)
DBS Programming was performed.    Manufacturer of DBS device: [employer]  Total time spent programming was 25 minutes.  Device was confirmed to be on.  Soft start was confirmed to be on.  Impedences were checked and were within normal limits.  Battery was checked and was determined to be functioning normally and not near the end of life.  Final settings were as follows:    Active Contact Amplitude (mA) PW (ms) Frequency (hz) Side Effects  Left Brain       11/23/20 3-(90%)4-(10%)C+ 3.8 (2.8-4.0) 60 130                        Right Brain       11/23/20 2-(30%)3-(70%)C+ 3.2 60 130 Tried PW of 70 but not tolerated (?dizzy)

## 2020-11-24 ENCOUNTER — Ambulatory Visit (INDEPENDENT_AMBULATORY_CARE_PROVIDER_SITE_OTHER): Payer: Medicare Other | Admitting: Family Medicine

## 2020-11-24 ENCOUNTER — Other Ambulatory Visit: Payer: Self-pay

## 2020-11-24 ENCOUNTER — Encounter: Payer: Self-pay | Admitting: Family Medicine

## 2020-11-24 VITALS — BP 126/80 | HR 75 | Ht 74.0 in | Wt 170.0 lb

## 2020-11-24 DIAGNOSIS — M999 Biomechanical lesion, unspecified: Secondary | ICD-10-CM

## 2020-11-24 NOTE — Patient Instructions (Addendum)
Good to see you Happy Holidays! See me again in 6-7 weeks

## 2020-12-15 ENCOUNTER — Ambulatory Visit: Payer: Medicare Other | Admitting: Family Medicine

## 2021-01-05 ENCOUNTER — Ambulatory Visit: Payer: Medicare Other | Admitting: Family Medicine

## 2021-01-06 ENCOUNTER — Ambulatory Visit (INDEPENDENT_AMBULATORY_CARE_PROVIDER_SITE_OTHER): Payer: Medicare Other | Admitting: Family Medicine

## 2021-01-06 ENCOUNTER — Other Ambulatory Visit: Payer: Self-pay

## 2021-01-06 ENCOUNTER — Encounter: Payer: Self-pay | Admitting: Family Medicine

## 2021-01-06 VITALS — BP 100/62 | HR 90 | Ht 74.0 in | Wt 166.0 lb

## 2021-01-06 DIAGNOSIS — M940 Chondrocostal junction syndrome [Tietze]: Secondary | ICD-10-CM | POA: Diagnosis not present

## 2021-01-06 DIAGNOSIS — M999 Biomechanical lesion, unspecified: Secondary | ICD-10-CM | POA: Diagnosis not present

## 2021-01-06 NOTE — Progress Notes (Signed)
[name] [address] [place] [address] Phone: [phone] Subjective:   Ian [name], am serving as a scribe for Dr. Antoine Primas. This visit occurred during the SARS-CoV-2 public health emergency.  Safety protocols were in place, including screening questions prior to the visit, additional usage of staff PPE, and extensive cleaning of exam room while observing appropriate contact time as indicated for disinfecting solutions.   I'm seeing this patient by the request  of:  Ian Harries, MD  CC: Back pain follow-up  [id number]  Ian [name] is a 60 y.o. male coming in with complaint of back and neck pain. OMT 11/24/2020. Patient states that he has not had any issues since last visit.  Patient has been doing some shoveling yesterday.  Very mild soreness but nothing severe.  Patient has not had the rib come out of place at this time.  Very mild is some back tightness but nothing severe.  No radiation down the legs  Medications patient has been prescribed: None          Reviewed prior external information including notes and imaging from previsou exam, outside providers and external EMR if available.   As well as notes that were available from care everywhere and other healthcare systems.  Past medical history, social, surgical and family history all reviewed in electronic medical record.  No pertanent information unless stated regarding to the chief complaint.   Past Medical History:  Diagnosis Date  . Anxiety   . History of kidney stones    passed  . PD (Parkinson's disease) (HCC) 09/10/2013  . PONV (postoperative nausea and vomiting)    nausea     No Known Allergies   Review of Systems:  No headache, visual changes, nausea, vomiting, diarrhea, constipation, dizziness, abdominal pain, skin rash, fevers, chills, night sweats, weight loss, swollen lymph nodes, body aches, joint swelling, chest pain, shortness of breath, mood  changes. POSITIVE muscle aches  Objective  Blood pressure 100/62, pulse 90, height 6\' 2"  (1.88 m), weight 166 lb (75.3 kg), SpO2 98 %.   General: No apparent distress alert and oriented x3 mood and affect normal, dressed appropriately.  HEENT: Pupils equal, extraocular movements intact  Respiratory: Patient's speak in full sentences and does not appear short of breath  Cardiovascular: No lower extremity edema, non tender, no erythema  Gait normal with good balance and coordination.  Back -neck exam does have some mild loss of lordosis.  Patient does have some hypertonicity of multiple joints patient has a negative .  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  Patient even shoveled snow and was very aware at this point.  Patient has done remarkably well with his Parkinson's as well as being very active.  I do not see any significant changes necessary and follow-up with me again 6-8 weels    Nonallopathic problems  Decision today to treat with OMT was based on Physical Exam  After verbal consent patient was treated with HVLA, ME, FPR techniques in cervical, rib, thoracic, lumbar, and sacral  areas  Patient  tolerated the procedure well with improvement in symptoms  Patient given exercises, stretches and lifestyle modifications  See medications in patient instructions if given  Patient will follow up in 6-8 weeks      The above documentation has been reviewed and is accurate and complete Judi Saa, DO       Note: This dictation was prepared with Dragon dictation along with smaller phrase  technology. Any transcriptional errors that result from this process are unintentional.

## 2021-01-06 NOTE — Assessment & Plan Note (Signed)
Patient is doing very well at this time.  Patient even shoveled snow and was very aware at this point.  Patient has done remarkably well with his Parkinson's as well as being very active.  I do not see any significant changes necessary and follow-up with me again 6-8 weels

## 2021-01-06 NOTE — Patient Instructions (Signed)
Tell your wife happy birthday Travel safe See me in 6-8 weeks

## 2021-02-11 ENCOUNTER — Other Ambulatory Visit: Payer: Self-pay

## 2021-02-11 MED ORDER — CLONAZEPAM 0.5 MG PO TABS: ORAL_TABLET | ORAL | 1 refills | Status: DC

## 2021-02-19 NOTE — Progress Notes (Signed)
[name] [address] [place] [address] Phone: [phone] Subjective:   Ian [name], am serving as a scribe for Dr. Antoine Primas. This visit occurred during the SARS-CoV-2 public health emergency.  Safety protocols were in place, including screening questions prior to the visit, additional usage of staff PPE, and extensive cleaning of exam room while observing appropriate contact time as indicated for disinfecting solutions.   I'm seeing this patient by the request  of:  Tracey Harries, MD  CC: Neck and back pain follow-up  [id number]  Ian [name] is a 60 y.o. male coming in with complaint of back and neck pain. OMT 01/06/2021. Patient states that he has been doing well.   Medications patient has been prescribed: None           Reviewed prior external information including notes and imaging from previsou exam, outside providers and external EMR if available.   As well as notes that were available from care everywhere and other healthcare systems.  Past medical history, social, surgical and family history all reviewed in electronic medical record.  No pertanent information unless stated regarding to the chief complaint.   Past Medical History:  Diagnosis Date  . Anxiety   . History of kidney stones    passed  . PD (Parkinson's disease) (HCC) 09/10/2013  . PONV (postoperative nausea and vomiting)    nausea     No Known Allergies   Review of Systems:  No headache, visual changes, nausea, vomiting, diarrhea, constipation, dizziness, abdominal pain, skin rash, fevers, chills, night sweats, weight loss, swollen lymph nodes, body aches, joint swelling, chest pain, shortness of breath, mood changes. POSITIVE muscle aches mild  Objective  Blood pressure 110/72, pulse 73, height 6\' 2"  (1.88 m), weight 168 lb (76.2 kg), SpO2 98 %.   General: No apparent distress alert and oriented x3 mood and affect normal, dressed  appropriately.  HEENT: Pupils equal, extraocular movements intact  Respiratory: Patient's speak in full sentences and does not appear short of breath  Cardiovascular: No lower extremity edema, non tender, no erythema   Gait normal with good balance and coordination.  MSK: Hypertonicity of multiple muscles noted.  Patient does have mild impingement with Hawkins of the left shoulder.  5 of 5 strength of the rotator cuff which is an improvement. Neck exam does have some loss of lordosis.  Patient does have hypertonicity of multiple joints still noted.  No significant resting tremor noted.  Patient is tight in the hamstrings bilaterally but seems to be patient's baseline.  Negative straight leg test.  5 out of 5 strength of the extremities.  Osteopathic findings  C5 flexed rotated and side bent right T3 extended rotated and side bent right inhaled rib T8 extended rotated and side bent left L2 flexed rotated and side bent right Sacrum right on right       Assessment and Plan:  Left rotator cuff tear Patient feels stable at this time.  Discussed with patient about potential injections if necessary.  Patient has had improvement in strength.  No other changes in management at this time.  Parkinson's disease (HCC) Likely contributing to some of the pain secondary to the hypertonicity but patient has done very well.  Does seem to increase the likelihood of for the slipped rib syndrome.  Patient has done well with conservative therapy.  Responding well to manipulation.  Follow-up again in 6 to 8 weeks    Nonallopathic problems  Decision today  to treat with OMT was based on Physical Exam  After verbal consent patient was treated with HVLA, ME, FPR techniques in cervical, rib, thoracic, lumbar, and sacral  areas  Patient tolerated the procedure well with improvement in symptoms  Patient given exercises, stretches and lifestyle modifications  See medications in patient instructions if  given  Patient will follow up in 4-8 weeks      The above documentation has been reviewed and is accurate and complete Judi Saa, DO       Note: This dictation was prepared with Dragon dictation along with smaller phrase technology. Any transcriptional errors that result from this process are unintentional.

## 2021-02-22 ENCOUNTER — Encounter: Payer: Self-pay | Admitting: Family Medicine

## 2021-02-22 ENCOUNTER — Other Ambulatory Visit: Payer: Self-pay

## 2021-02-22 ENCOUNTER — Ambulatory Visit (INDEPENDENT_AMBULATORY_CARE_PROVIDER_SITE_OTHER): Payer: Medicare Other | Admitting: Family Medicine

## 2021-02-22 VITALS — BP 110/72 | HR 73 | Ht 74.0 in | Wt 168.0 lb

## 2021-02-22 DIAGNOSIS — G20 Parkinson's disease: Secondary | ICD-10-CM | POA: Diagnosis not present

## 2021-02-22 DIAGNOSIS — M999 Biomechanical lesion, unspecified: Secondary | ICD-10-CM

## 2021-02-22 DIAGNOSIS — M75112 Incomplete rotator cuff tear or rupture of left shoulder, not specified as traumatic: Secondary | ICD-10-CM | POA: Diagnosis not present

## 2021-02-22 NOTE — Assessment & Plan Note (Signed)
Patient feels stable at this time.  Discussed with patient about potential injections if necessary.  Patient has had improvement in strength.  No other changes in management at this time.

## 2021-02-22 NOTE — Assessment & Plan Note (Signed)
Likely contributing to some of the pain secondary to the hypertonicity but patient has done very well.  Does seem to increase the likelihood of for the slipped rib syndrome.  Patient has done well with conservative therapy.  Responding well to manipulation.  Follow-up again in 6 to 8 weeks

## 2021-02-22 NOTE — Patient Instructions (Signed)
Good to see you Continue to watch the shoulder See me again in 6-8 weeks

## 2021-04-07 ENCOUNTER — Ambulatory Visit: Payer: Medicare Other | Admitting: Family Medicine

## 2021-04-12 NOTE — Progress Notes (Signed)
[name] [address] [place] [address] Phone: [phone] Subjective:   [name], am serving as a scribe for Dr. Antoine Primas. This visit occurred during the SARS-CoV-2 public health emergency.  Safety protocols were in place, including screening questions prior to the visit, additional usage of staff PPE, and extensive cleaning of exam room while observing appropriate contact time as indicated for disinfecting solutions.   I'm seeing this patient by the request  of:  Tracey Harries, MD  CC: Back and neck pain follow-up  [id number]  Ian [name] is a 60 y.o. male coming in with complaint of back and neck pain. OMT 02/22/2021. Patient states that he has had some mild tightness.  He was doing a lot of yard work the other day and feels like that contributed to some of it.  Nothing no severe.  Nothing that stopping him from activities. Patient last time was having left shoulder pain.  Feels like it is still doing relatively well and continuing to make improvement.  Nothing that stops him from activity.  Mild soreness but no true pain  Medications patient has been prescribed: None         Reviewed prior external information including notes and imaging from previsou exam, outside providers and external EMR if available.   As well as notes that were available from care everywhere and other healthcare systems.  Past medical history, social, surgical and family history all reviewed in electronic medical record.  No pertanent information unless stated regarding to the chief complaint.   Past Medical History:  Diagnosis Date  . Anxiety   . History of kidney stones    passed  . PD (Parkinson's disease) (HCC) 09/10/2013  . PONV (postoperative nausea and vomiting)    nausea     No Known Allergies   Review of Systems:  No headache, visual changes, nausea, vomiting, diarrhea, constipation, dizziness, abdominal pain, skin rash, fevers,  chills, night sweats, weight loss, swollen lymph nodes, body aches, joint swelling, chest pain, shortness of breath, mood changes. POSITIVE muscle aches  Objective  Blood pressure 110/62, pulse 80, height 6\' 2"  (1.88 m), weight 169 lb (76.7 kg), SpO2 99 %.   General: No apparent distress alert and oriented x3 mood and affect normal, dressed appropriately.  HEENT: Pupils equal, extraocular movements intact  Respiratory: Patient's speak in full sentences and does not appear short of breath  Cardiovascular: No lower extremity edema, non tender, no erythema  Gait normal with good balance and coordination.  MSK: Patient does have mild hypertonicity of multiple joints good core strength noted Back -very mild tremor noted of the left lower extremity.  Patient does have some very mild hypertonicity noted of the lower back.  Tightness noted with FABER test but no true radicular symptoms with straight leg test.  5 of 5 strength in lower extremities.  No cogwheeling noted.  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  Still not using any significant medications for.  Discussed posture and numbness.  Discussed which activities to do which wants to avoid.  Increase activity slowly.  Patient continues to respond well to manipulation and follow-up again in 6 weeks    Nonallopathic problems  Decision today to treat with OMT was  based on Physical Exam  After verbal consent patient was treated with HVLA, ME, FPR techniques in cervical, rib, thoracic, lumbar, and sacral  areas  Patient tolerated the procedure well with improvement in symptoms  Patient given exercises, stretches and lifestyle modifications  See medications in patient instructions if given  Patient will follow up in  4-8 weeks      The above documentation has been reviewed and is accurate and complete Judi Saa, DO       Note: This dictation was prepared with Dragon dictation along with smaller phrase technology. Any transcriptional errors that result from this process are unintentional.

## 2021-04-13 ENCOUNTER — Ambulatory Visit (INDEPENDENT_AMBULATORY_CARE_PROVIDER_SITE_OTHER): Payer: Medicare Other | Admitting: Family Medicine

## 2021-04-13 ENCOUNTER — Other Ambulatory Visit: Payer: Self-pay

## 2021-04-13 ENCOUNTER — Encounter: Payer: Self-pay | Admitting: Family Medicine

## 2021-04-13 VITALS — BP 110/62 | HR 80 | Ht 74.0 in | Wt 169.0 lb

## 2021-04-13 DIAGNOSIS — M940 Chondrocostal junction syndrome [Tietze]: Secondary | ICD-10-CM | POA: Diagnosis not present

## 2021-04-13 DIAGNOSIS — M9904 Segmental and somatic dysfunction of sacral region: Secondary | ICD-10-CM | POA: Diagnosis not present

## 2021-04-13 DIAGNOSIS — M9901 Segmental and somatic dysfunction of cervical region: Secondary | ICD-10-CM | POA: Diagnosis not present

## 2021-04-13 DIAGNOSIS — M9902 Segmental and somatic dysfunction of thoracic region: Secondary | ICD-10-CM

## 2021-04-13 DIAGNOSIS — M9903 Segmental and somatic dysfunction of lumbar region: Secondary | ICD-10-CM | POA: Diagnosis not present

## 2021-04-13 DIAGNOSIS — G20 Parkinson's disease: Secondary | ICD-10-CM | POA: Diagnosis not present

## 2021-04-13 DIAGNOSIS — M9908 Segmental and somatic dysfunction of rib cage: Secondary | ICD-10-CM | POA: Diagnosis not present

## 2021-04-13 DIAGNOSIS — M75112 Incomplete rotator cuff tear or rupture of left shoulder, not specified as traumatic: Secondary | ICD-10-CM | POA: Diagnosis not present

## 2021-04-13 NOTE — Assessment & Plan Note (Signed)
Chronic problem with mild exacerbation.  Still not using any significant medications for.  Discussed posture and numbness.  Discussed which activities to do which wants to avoid.  Increase activity slowly.  Patient continues to respond well to manipulation and follow-up again in 6 weeks

## 2021-04-13 NOTE — Assessment & Plan Note (Signed)
Patient feels like he continues to make improvement.  No change in management as long as patient feels like he continues to make improvement.

## 2021-04-13 NOTE — Patient Instructions (Signed)
Good to see you Careful with yardwork See me again in 6 weeks

## 2021-04-22 NOTE — Progress Notes (Signed)
Assessment/Plan:   1.  Parkinsons Disease  -The patient underwent bilateral STN DBS with [employer] device on 02/10/2017 and had IPG placement on 02/17/2017.He did have his battery changed on October 19, 2018 to an MRI compatible battery. -Continue [name], 95 mg, 2 tablets 4 times per day  -Having wearing off and I really would like him to try opicapone, 50 mg nightly.  Samples provided.  2. Fatigue  -Ultimately, this is likely related to the disease, but I do not think that there are any medications that are going to help. We have tried Provigil, Nuvigil and selegiline, all without relief. He is exercising.  -try amantadine 100 mg bid-tid  -Ultimately, if that does not help, the only other thing really available is amphetamine-based medications, which we do not prescribe out of our practice. 3. REM behavior disorder. -On clonazepam 0.5 mg, half tablet at night.  PDMP has been reviewed.  No red flags.   Subjective:   Ian [name] was seen today in follow up for Parkinsons disease.  My previous records were reviewed prior to todays visit as well as outside records available to me. Pt denies falls.  Pt denies lightheadedness, near syncope.  No hallucinations.  Mood has been good.  He is on [name], but was having some wearing off last visit.  I gave him some opicapone to try and he reports that he did not try it because the adjustment made in DBS helped that and the tremor.  His biggest complaint continues to be that of fatigue.  He is sleeping well at night.  He is still following with Dr. Katrinka Blazing.  He last saw him on April 26.  He was receiving OMT for "slipped rib syndrome."  Current prescribed movement disorder medications: [name] 95 mg,2 tablets 4 times per day  Clonazepam 0.5 mg, half tablet at night Opicapone, 50 mg (started last visit) - he didn't try   PREVIOUS MEDICATIONS:Requip;Neupro (tried only a few days); propranolol  helped tremor some; Inbrija (samples given but not taken); Provigil; Nuvigil; selegiline (no help for fatigue); opicapone (gave samples but he did not end up trying them, as did not feel he needed them at the time)   ALLERGIES:  No Known Allergies  CURRENT MEDICATIONS:  Outpatient Encounter Medications as of 04/26/2021  Medication Sig  . Ascorbic Acid (VITAMIN C PO) Take 3 tablets by mouth 3 (three) times daily.   [name] b complex vitamins tablet Take 1 tablet by mouth 2 (two) times daily.  . Carbidopa-Levodopa ER ([name]) 23.75-95 MG CPCR Take 2 capsules by mouth 4 (four) times daily.  . clonazePAM (KLONOPIN) 0.5 MG tablet TAKE 1/2 TABLET(0.25 MG) BY MOUTH AT BEDTIME  . Melatonin 3 MG CAPS Take 3 mg by mouth at bedtime.  [name] Opicapone (ONGENTYS) 50 MG CAPS Take 1 capsule by mouth daily. (Patient not taking: Reported on 04/26/2021)   No facility-administered encounter medications on file as of 04/26/2021.    Objective:   PHYSICAL EXAMINATION:    VITALS:   Vitals:   04/26/21 0957  BP: 108/64  Pulse: 99  SpO2: 99%  Weight: 168 lb (76.2 kg)  Height: 6\' 2"  (1.88 m)    GEN:  The patient appears stated age and is in NAD. HEENT:  Normocephalic, atraumatic.    Neurological examination:  Orientation: The patient is alert and oriented x3. Cranial nerves: There is good facial symmetry with minimal facial hypomimia.  Extraocular muscles are intact.  The speech is fluent and clear.  He is hypophonic.  Soft palate rises symmetrically and there is no tongue deviation. Hearing is intact to conversational tone. Sensation: Sensation is intact to light touch throughout Motor: Strength is 5/5 in the bilateral upper extremities.  No pronator drift.  Movement examination: Tone: There is normal tone on the left.  He has mild increased tone in the right upper extremity Abnormal movements: There is mild tremor in the right leg.  None on the left today.  He took his medication about 45 minutes to 1 hour before  he came in, and he does develop just a mild amount of dyskinesia before he left today. Coordination:  There is no decremation with RAM's Gait and Station: The patient is able to ambulate well.  He has decreased arm swing on the right.  I have reviewed and interpreted the following labs independently    Chemistry      Component Value Date/Time   NA 141 10/15/2018 1533   K 4.4 10/15/2018 1533   CL 105 10/15/2018 1533   CO2 28 10/15/2018 1533   BUN 16 10/15/2018 1533   CREATININE 1.05 10/15/2018 1533   CREATININE 0.91 11/25/2014 1051      Component Value Date/Time   CALCIUM 9.7 10/15/2018 1533   ALKPHOS 69 06/10/2015 1155   AST 27 06/10/2015 1155   ALT 40 06/10/2015 1155   BILITOT 1.0 06/10/2015 1155       Lab Results  Component Value Date   WBC 6.5 10/15/2018   HGB 15.4 10/15/2018   HCT 48.2 10/15/2018   MCV 91.6 10/15/2018   PLT 302 10/15/2018    No results found for: TSH   Total time spent on today's visit was 30 minutes, including both face-to-face time and nonface-to-face time.  Time included that spent on review of records (prior notes available to me/labs/imaging if pertinent), discussing treatment and goals, answering patient's questions and coordinating care.  This did not include DBS time, which is on a separate programming procedural note.  Cc:  Tracey Harries, MD

## 2021-04-26 ENCOUNTER — Other Ambulatory Visit: Payer: Self-pay

## 2021-04-26 ENCOUNTER — Encounter: Payer: Self-pay | Admitting: Neurology

## 2021-04-26 ENCOUNTER — Encounter: Payer: Self-pay | Admitting: Family Medicine

## 2021-04-26 ENCOUNTER — Ambulatory Visit (INDEPENDENT_AMBULATORY_CARE_PROVIDER_SITE_OTHER): Payer: Medicare Other | Admitting: Neurology

## 2021-04-26 DIAGNOSIS — G20 Parkinson's disease: Secondary | ICD-10-CM | POA: Diagnosis not present

## 2021-04-26 MED ORDER — AMANTADINE HCL 100 MG PO CAPS: 100.0000 mg | ORAL_CAPSULE | Freq: Three times a day (TID) | ORAL | 1 refills | Status: DC

## 2021-04-26 NOTE — Patient Instructions (Signed)
Try the amantadine 100 mg two to three times per day  Online Resources for Power over Parkinson's Group May 2022  . Local [place] Online Groups  o Power over [employer] Group :   - Power Over Parkinson's Patient Education Group will be Wednesday, May 11th at 2pm via Zoom.   - Upcoming Power over Parkinson's Meetings:  2nd Wednesdays of the month at 2 pm:  June 8th, July 13th - Contact Amy [place] at [email or web address] if interested in participating in this online group o Parkinson's Care Partners Group:    3rd Mondays, Contact Misty Paladino o Atypical Parkinsonian Patient Group:   4th Wednesdays, Contact Misty Paladino o If you are interested in participating in these online groups with Misty, please contact her directly for how to join those meetings.  Her contact information is [email or web address].   . Parkinson Foundation:  www.parkinson.org o PD Health at Home continues:  Mindfulness Mondays, Expert Briefing Tuesdays, Wellness Wednesdays, Take Time Thursdays, Fitness Fridays -Listings for May 2022 are on the website o Upcoming Webinar:  Newly Diagnosed Building a Better Life with Parkinson   Wednesday, May 18th @ 1 pm o Register for Research officer, trade union) at [email or web address] o  Please check out their website to sign up for emails and see their full online offerings  . [employer] Foundation:  www.michaeljfox.org  o Upcoming Webinar:   What's in your DNA, understanding Parkinson's genetics.  Thursday, May 19th @ 12 noon o Check out additional information on their website to see their full online offerings  . May 21 Foundation:  www.davisphinneyfoundation.org o Upcoming Webinar:  Stay tuned o Care Partner Monthly Meetup.  With [employer] [place].  First Tuesday of each month, 2 pm o Joy Breaks:  First Wednesday of each month, 2-3 pm. There will be art, doodling, making, crafting, listening, laughing, stories, and everything  in between. No art experience necessary. No supplies required. Just show up for joy!  Register on their website. o Check out additional information to Live Well Today on their website  . Parkinson and Movement Disorders (PMD) Alliance:  www.pmdalliance.org o [employer] Online:  Online Education Events o Sign up for emails, which are sent weekly to give you updates on programming and online offerings     . Parkinson's Association of the [place]:  www.parkinsonassociation.org o Information on online support groups, education events, and online exercises including Yoga, Parkinson's exercises and more-LOTS of information on links to PD resources and online events o Virtual Support Group through Parkinson's Association of the [place]; next one is scheduled for Wednesday, May 4th, 2022 at 2 pm. (These are typically scheduled for the 1st Wednesday of the month at 2 pm).  Visit website for details.  . Additional links for movement activities: o PWR! Moves Classes at [facility] Exercise Room HAVE RESUMED!  Wednesdays 10 and 11 am.  Contact Amy Marriott, PT [email or web address] or [phone] if interested o Here is a link to the PWR!Moves classes on Zoom from [phone] - Daily Mon-Sat at 10:00. Via Zoom, FREE and open to all.  There is also a link below via Facebook if you use that platform. - [place] - [email or web address]://www.[email or web address].[email or web address] o Parkinson's Wellness Recovery (PWR! Moves)  www.pwr4life.org - Info on the PWR! Virtual Experience:  You will have access to our expertise through self-assessment, guided plans that start with the PD-specific fundamentals, educational content, tips, Q&A with an expert, and a growing [email or web address]/ of PD-specific pre-recorded and live exercise classes of varying  types and  intensity - both physical and cognitive! If that is not enough, we offer 1:1 wellness consultations (in-person or virtual) to personalize your PWR! Dance movement psychotherapist.  - Check out the PWR! Move of the month on the Parkinson Wellness Recovery website:  [email or web address].de o [employer]  Fridays:  - As part of the PD Health @ Home program, this free video series focuses each week on one aspect of fitness designed to support people living with Parkinson's.  These weekly videos highlight the Parkinson Foundation recent fitness guidelines for people with Parkinson's disease. -  http://www.morris.com/ o Dance for PD website is offering free, live-stream classes throughout the week, as well as links to [employer] of classes:  [email or web address]://danceforparkinsons.org/ o Dance for Parkinson's Class:  [facility] [place].  Free offering for people with Parkinson's and care partners; virtual class.  o For more information, contact [phone] or email Allena Napoleon at [email or web address] o Virtual dance and Pilates for Parkinson's classes: Click on the Community Tab> Parkinson's Movement Initiative Tab.  To register for classes and for more information, visit www.[email or web address].co.za and click the "community" tab.     o [employer] Parkinson's Cycling Classes  - [facility]: 1pm on Fridays-Live classes at [employer] ([employer] at [email or web address] or ([phone]) [employer]: Virtual Classes Mondays and Thursdays Fawn Kirk classes Tuesday, Wednesday and Thursday (contact [place] at [email or web address]  or [phone])  o [facility] Boxing - Three levels of classes are offered Tuesdays and Thursdays:  10:30 am,  12 noon & 1:45 pm at [facility].  - Active Stretching with Lorenda Peck Class starting in March, on  Fridays - To observe a class or for  more information, call [phone] or email [email or web address] . Well[employer]: o Financial trader Opportunities:  www.well-springsolutions.org/caregiver-education/caregiver-support-group.  You may also contact Loleta Chance at [email or web address] or [phone].   o [facility] for Family Caregivers! Thursday, May 12th 10:00a-1:30p [place] May 14, [employer], [address], [place] . You may contact [place] at [email or web address] or [phone].   o Well-Spring Navigator:  Just1Navigator program, a free service to help individuals and families through the journey of determining care for older adults.  The "Navigator" is a 07-10-1990, [phone], who will speak with a prospective client and/or loved ones to provide an assessment of the situation and a set of recommendations for a personalized care plan -- all free of charge, and whether [facility] offers the needed service or not. If the need is not a service we provide, we are well-connected with reputable programs in town that we can refer you to.  www.well-springsolutions.org or to speak with the Navigator, call [phone].

## 2021-04-26 NOTE — Procedures (Signed)
DBS Programming was performed.    Manufacturer of DBS device: [employer]  Total time spent programming was 8 minutes.  Device was confirmed to be on.  Soft start was confirmed to be on.  Impedences were checked and were within normal limits.  Battery was checked and was determined to be functioning normally and not near the end of life.  Final settings were as follows:    Active Contact Amplitude (mA) PW (ms) Frequency (hz) Side Effects  Left Brain       11/23/20 3-(90%)4-(10%)C+ 3.8 (2.8-4.0) 60 130   04/26/21 3-(90%)4-(10%)C+ 3.8 60 130                 Right Brain       11/23/20 2-(30%)3-(70%)C+ 3.2 60 130 Tried PW of 70 but not tolerated (?dizzy)  04/26/21 2-(30%)3-(70%)C+ 3.2 60 130

## 2021-05-20 NOTE — Progress Notes (Deleted)
  [name] [address] [place] [address] Phone: [phone] Subjective:    I'm seeing this patient by the request  of:  Tracey Harries, MD  CC:   [id number]  Lelon Ikard is a 60 y.o. male coming in with complaint of back and neck pain. OMT 04/13/2021. Patient states   Medications patient has been prescribed: None Taking:         Reviewed prior external information including notes and imaging from previsou exam, outside providers and external EMR if available.   As well as notes that were available from care everywhere and other healthcare systems.  Past medical history, social, surgical and family history all reviewed in electronic medical record.  No pertanent information unless stated regarding to the chief complaint.   Past Medical History:  Diagnosis Date  . Anxiety   . History of kidney stones    passed  . PD (Parkinson's disease) (HCC) 09/10/2013  . PONV (postoperative nausea and vomiting)    nausea     No Known Allergies   Review of Systems:  No headache, visual changes, nausea, vomiting, diarrhea, constipation, dizziness, abdominal pain, skin rash, fevers, chills, night sweats, weight loss, swollen lymph nodes, body aches, joint swelling, chest pain, shortness of breath, mood changes. POSITIVE muscle aches  Objective  There were no vitals taken for this visit.   General: No apparent distress alert and oriented x3 mood and affect normal, dressed appropriately.  HEENT: Pupils equal, extraocular movements intact  Respiratory: Patient's speak in full sentences and does not appear short of breath  Cardiovascular: No lower extremity edema, non tender, no erythema  Neuro: Cranial nerves II through XII are intact, neurovascularly intact in all extremities with 2+ DTRs and 2+ pulses.  Gait normal with good balance and coordination.  MSK:  Non tender with full range of motion and good stability and symmetric strength and  tone of shoulders, elbows, wrist, hip, knee and ankles bilaterally.  Back - Normal skin, Spine with normal alignment and no deformity.  No tenderness to vertebral process palpation.  Paraspinous muscles are not tender and without spasm.   Range of motion is full at neck and lumbar sacral regions  Osteopathic findings  C2 flexed rotated and side bent right C6 flexed rotated and side bent left T3 extended rotated and side bent right inhaled rib T9 extended rotated and side bent left L2 flexed rotated and side bent right Sacrum right on right       Assessment and Plan:    Nonallopathic problems  Decision today to treat with OMT was based on Physical Exam  After verbal consent patient was treated with HVLA, ME, FPR techniques in cervical, rib, thoracic, lumbar, and sacral  areas  Patient tolerated the procedure well with improvement in symptoms  Patient given exercises, stretches and lifestyle modifications  See medications in patient instructions if given  Patient will follow up in 4-8 weeks      The above documentation has been reviewed and is accurate and complete [name]       Note: This dictation was prepared with Dragon dictation along with smaller phrase technology. Any transcriptional errors that result from this process are unintentional.

## 2021-05-21 ENCOUNTER — Other Ambulatory Visit: Payer: Self-pay | Admitting: Neurology

## 2021-05-21 DIAGNOSIS — G20 Parkinson's disease: Secondary | ICD-10-CM

## 2021-05-25 ENCOUNTER — Ambulatory Visit: Payer: Medicare Other | Admitting: Family Medicine

## 2021-05-25 ENCOUNTER — Encounter: Payer: Self-pay | Admitting: Family Medicine

## 2021-05-28 ENCOUNTER — Other Ambulatory Visit: Payer: Self-pay

## 2021-05-28 ENCOUNTER — Encounter: Payer: Self-pay | Admitting: Family Medicine

## 2021-05-28 ENCOUNTER — Ambulatory Visit (INDEPENDENT_AMBULATORY_CARE_PROVIDER_SITE_OTHER): Payer: Medicare Other | Admitting: Family Medicine

## 2021-05-28 VITALS — BP 100/70 | HR 76 | Ht 74.0 in | Wt 169.0 lb

## 2021-05-28 DIAGNOSIS — M940 Chondrocostal junction syndrome [Tietze]: Secondary | ICD-10-CM

## 2021-05-28 DIAGNOSIS — M9904 Segmental and somatic dysfunction of sacral region: Secondary | ICD-10-CM | POA: Diagnosis not present

## 2021-05-28 DIAGNOSIS — M9908 Segmental and somatic dysfunction of rib cage: Secondary | ICD-10-CM | POA: Diagnosis not present

## 2021-05-28 DIAGNOSIS — B360 Pityriasis versicolor: Secondary | ICD-10-CM | POA: Diagnosis not present

## 2021-05-28 DIAGNOSIS — M9903 Segmental and somatic dysfunction of lumbar region: Secondary | ICD-10-CM | POA: Diagnosis not present

## 2021-05-28 DIAGNOSIS — M9902 Segmental and somatic dysfunction of thoracic region: Secondary | ICD-10-CM | POA: Diagnosis not present

## 2021-05-28 DIAGNOSIS — M9901 Segmental and somatic dysfunction of cervical region: Secondary | ICD-10-CM | POA: Diagnosis not present

## 2021-05-28 MED ORDER — KETOCONAZOLE 2 % EX CREA: 1.0000 "application " | TOPICAL_CREAM | Freq: Every day | CUTANEOUS | 3 refills | Status: DC

## 2021-05-28 MED ORDER — TERBINAFINE HCL 1 % EX CREA: 1.0000 "application " | TOPICAL_CREAM | Freq: Two times a day (BID) | CUTANEOUS | 0 refills | Status: DC

## 2021-05-28 NOTE — Assessment & Plan Note (Signed)
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  Seems to be more secondary to Parkinson's still.  Patient will continue to be active.  We will continue to do the home exercises.  Increase activity as tolerated.  Follow-up with me again in 6 weeks

## 2021-05-28 NOTE — Assessment & Plan Note (Signed)
Patient has what appears to be tinea versicolor on his upper trunk.  Discussed with patient.  We will give patient topical creams to try first.  Otherwise would switch to oral fluconazole doing 200 mg weekly for 2 to 3 weeks.

## 2021-05-28 NOTE — Patient Instructions (Addendum)
Good to see you Take ketoconazole cream topically daily until resolved If you need refill give [place] a call If it worsens see primary care or call us if we need to do oral medications See me again in 6 weeks

## 2021-05-28 NOTE — Progress Notes (Signed)
[name] [address] [place] [address] Phone: [phone] Subjective:   I Ian [name] am serving as a Neurosurgeon for Dr. Antoine Primas.  This visit occurred during the SARS-CoV-2 public health emergency.  Safety protocols were in place, including screening questions prior to the visit, additional usage of staff PPE, and extensive cleaning of exam room while observing appropriate contact time as indicated for disinfecting solutions.   I'm seeing this patient by the request  of:  Tracey Harries, MD  CC: Back pain follow-up  [id number]  Ian [name] is a 60 y.o. male coming in with complaint of back and neck pain Patient states he is doing well.  Patient does have low back..  Patient also has pain in the upper back.  He states that it seems to be similar to his other previous pain.  Denies any radiation.  Does have a rash she would like me to look at as well.          Reviewed prior external information including notes and imaging from previsou exam, outside providers and external EMR if available.   As well as notes that were available from care everywhere and other healthcare systems.  Past medical history, social, surgical and family history all reviewed in electronic medical record.  No pertanent information unless stated regarding to the chief complaint.   Past Medical History:  Diagnosis Date   Anxiety    History of kidney stones    passed   PD (Parkinson's disease) (HCC) 09/10/2013   PONV (postoperative nausea and vomiting)    nausea     No Known Allergies   Review of Systems:  No headache, visual changes, nausea, vomiting, diarrhea, constipation, dizziness, abdominal pain, skin rash, fevers, chills, night sweats, weight loss, swollen lymph nodes, body aches, joint swelling, chest pain, shortness of breath, mood changes. POSITIVE muscle aches  Objective  Blood pressure 100/70, pulse 76, height 6\' 2"  (1.88 m), weight 169  lb (76.7 kg), SpO2 99 %.   General: No apparent distress alert and oriented x3 mood and affect normal, dressed appropriately.  Patient's skin though on the torso does have hypopigmented areas noted across his core. HEENT: Pupils equal, extraocular movements intact  Respiratory: Patient's speak in full sentences and does not appear short of breath  Cardiovascular: No lower extremity edema, non tender, no erythema  Patient continues to have some hypertonicity noted at baseline.  Continued mild tremor noted in the left lower extremity.  Patient does have tightness noted with FABER test bilaterally but is able to do it.  Tightness with straight leg test but no true radicular symptoms.  5 out of 5 strength of the lower extremities.  No cogwheeling noted.  Osteopathic findings  C6 flexed rotated and side bent left T4 extended rotated and side bent right inhaled rib T8 extended rotated and side bent left L5 flexed rotated and side bent left  Sacrum right on right       Assessment and Plan:  Tinea versicolor Patient has what appears to be tinea versicolor on his upper trunk.  Discussed with patient.  We will give patient topical creams to try first.  Otherwise would switch to oral fluconazole doing 200 mg weekly for 2 to 3 weeks.  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  Seems to be more secondary to Parkinson's still.  Patient will continue to be active.  We will continue to do the home exercises.  Increase activity  as tolerated.  Follow-up with me again in 6 weeks   Nonallopathic problems  Decision today to treat with OMT was based on Physical Exam  After verbal consent patient was treated with HVLA, ME, FPR techniques in cervical, rib, thoracic, lumbar, and sacral  areas  Patient tolerated the procedure well with improvement in symptoms  Patient given exercises, stretches and lifestyle modifications  See medications in patient instructions if  given  Patient will follow up in 4-8 weeks      The above documentation has been reviewed and is accurate and complete Judi Saa, DO       Note: This dictation was prepared with Dragon dictation along with smaller phrase technology. Any transcriptional errors that result from this process are unintentional.

## 2021-06-14 MED ORDER — ONGENTYS 50 MG PO CAPS: 50.0000 mg | ORAL_CAPSULE | Freq: Every day | ORAL | 0 refills | Status: DC

## 2021-06-14 NOTE — Addendum Note (Signed)
Addended by: Karl Luke A on: 06/14/2021 09:59 AM   Modules accepted: Orders

## 2021-07-06 NOTE — Addendum Note (Signed)
Addended by: Kerin Salen S on: 07/06/2021 11:05 AM   Modules accepted: Orders

## 2021-07-06 NOTE — Telephone Encounter (Signed)
I put in his order for ST.  See if he already signed a form for opicapone (ongentys), if there is one.  Please send RX to the pharmacy for prior auth, 50 mg q hs.  Supply samples if needed before then

## 2021-07-07 NOTE — Progress Notes (Signed)
[name] [address] [place] [address] Phone: [phone] Subjective:   Ian [name], am serving as a scribe for Dr. Antoine Primas.  This visit occurred during the SARS-CoV-2 public health emergency.  Safety protocols were in place, including screening questions prior to the visit, additional usage of staff PPE, and extensive cleaning of exam room while observing appropriate contact time as indicated for disinfecting solutions.    I'm seeing this patient by the request  of:  Tracey Harries, MD  CC: Back and neck pain follow-up  [id number]  Ian [name] is a 60 y.o. male coming in with complaint of back and neck pain. OMT 05/28/2021. Patient states that he has not had any issues since last visit. Patient is no longer suffering from mild discomfort.  States that patient feels that the skin issue he had as well which was the tinea versicolor is improved.  States that it does not feel like it is completely gone yet.  Medications patient has been prescribed: Lamisil, ketaconazole cream         Past Medical History:  Diagnosis Date   Anxiety    History of kidney stones    passed   PD (Parkinson's disease) (HCC) 09/10/2013   PONV (postoperative nausea and vomiting)    nausea     No Known Allergies   Review of Systems:  No headache, visual changes, nausea, vomiting, diarrhea, constipation, dizziness, abdominal pain, skin rash, fevers, chills, night sweats, weight loss, swollen lymph nodes, body aches, joint swelling, chest pain, shortness of breath, mood changes. POSITIVE muscle aches  Objective  Blood pressure 110/78, pulse 68, height 6\' 2"  (1.88 m), weight 168 lb (76.2 kg), SpO2 98 %.   General: No apparent distress alert and oriented x3 mood and affect normal, dressed appropriately.  HEENT: Pupils equal, extraocular movements intact mild masked facies Respiratory: Patient's speak in full sentences and does not appear short of  breath  Cardiovascular: No lower extremity edema, non tender, no erythema  Gait normal with good balance and coordination.  MSK:  Non tender with full range of motion and good stability and symmetric strength and tone of shoulders, elbows, wrist, hip, knee and ankles bilaterally.  Back -mild hypertonicity noted but overall not too terrible.  Mild resting tremor noted in the left lower extremity. 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  Discussed icing regimen and home exercises.  Increase activity slowly.  Follow-up with me again in 6 to 8 weeks.  Tinea versicolor Improvement noted but will refill the topical antibiotic fungal medication again.   Nonallopathic problems  Decision today to treat with OMT was based on Physical Exam  After verbal consent patient was treated with HVLA, ME, FPR techniques in cervical, rib, thoracic, lumbar, and sacral  areas  Patient tolerated the procedure well with improvement in symptoms  Patient given exercises, stretches and lifestyle modifications  See medications in patient instructions if given  Patient will follow up in 4-8 weeks      The above documentation  has been reviewed and is accurate and complete Lyndal Pulley, DO       Note: This dictation was prepared with Dragon dictation along with smaller phrase technology. Any transcriptional errors that result from this process are unintentional.

## 2021-07-12 ENCOUNTER — Other Ambulatory Visit: Payer: Self-pay

## 2021-07-12 ENCOUNTER — Ambulatory Visit (INDEPENDENT_AMBULATORY_CARE_PROVIDER_SITE_OTHER): Payer: Medicare Other | Admitting: Family Medicine

## 2021-07-12 ENCOUNTER — Encounter: Payer: Self-pay | Admitting: Family Medicine

## 2021-07-12 VITALS — BP 110/78 | HR 68 | Ht 74.0 in | Wt 168.0 lb

## 2021-07-12 DIAGNOSIS — M9901 Segmental and somatic dysfunction of cervical region: Secondary | ICD-10-CM | POA: Diagnosis not present

## 2021-07-12 DIAGNOSIS — M9908 Segmental and somatic dysfunction of rib cage: Secondary | ICD-10-CM | POA: Diagnosis not present

## 2021-07-12 DIAGNOSIS — B360 Pityriasis versicolor: Secondary | ICD-10-CM | POA: Diagnosis not present

## 2021-07-12 DIAGNOSIS — M9902 Segmental and somatic dysfunction of thoracic region: Secondary | ICD-10-CM | POA: Diagnosis not present

## 2021-07-12 DIAGNOSIS — M9904 Segmental and somatic dysfunction of sacral region: Secondary | ICD-10-CM | POA: Diagnosis not present

## 2021-07-12 DIAGNOSIS — M940 Chondrocostal junction syndrome [Tietze]: Secondary | ICD-10-CM | POA: Diagnosis not present

## 2021-07-12 DIAGNOSIS — M9903 Segmental and somatic dysfunction of lumbar region: Secondary | ICD-10-CM | POA: Diagnosis not present

## 2021-07-12 MED ORDER — KETOCONAZOLE 2 % EX CREA: 1.0000 "application " | TOPICAL_CREAM | Freq: Every day | CUTANEOUS | 3 refills | Status: AC

## 2021-07-12 MED ORDER — TERBINAFINE HCL 1 % EX CREA: 1.0000 "application " | TOPICAL_CREAM | Freq: Two times a day (BID) | CUTANEOUS | 0 refills | Status: AC

## 2021-07-12 NOTE — Assessment & Plan Note (Signed)
Chronic problem but is doing relatively well.  Discussed icing regimen and home exercises.  Increase activity slowly.  Follow-up with me again in 6 to 8 weeks.

## 2021-07-12 NOTE — Patient Instructions (Signed)
Good to see you Enjoy the beach  Continue everything else See me again in 6 weeks

## 2021-07-12 NOTE — Assessment & Plan Note (Signed)
Improvement noted but will refill the topical antibiotic fungal medication again.

## 2021-07-13 ENCOUNTER — Ambulatory Visit: Payer: Medicare Other | Admitting: Occupational Therapy

## 2021-07-13 ENCOUNTER — Ambulatory Visit: Payer: Medicare Other

## 2021-07-13 ENCOUNTER — Ambulatory Visit: Payer: Medicare Other | Attending: Neurology | Admitting: Physical Therapy

## 2021-07-13 DIAGNOSIS — R471 Dysarthria and anarthria: Secondary | ICD-10-CM

## 2021-07-13 DIAGNOSIS — R29818 Other symptoms and signs involving the nervous system: Secondary | ICD-10-CM

## 2021-07-13 DIAGNOSIS — R2689 Other abnormalities of gait and mobility: Secondary | ICD-10-CM | POA: Insufficient documentation

## 2021-07-13 NOTE — Therapy (Signed)
[facility] [address] [place], [place], [address] Phone: [phone]   Fax:  [phone]  Patient Details  Name: Ian [name] MRN: [id number] Date of Birth: 1961-06-25 Referring Provider:  Vladimir Faster, DO  Encounter Date: 07/13/2021    Physical Therapy Parkinson's Disease Screen   Timed Up and Go test: 8.19 sec  10 meter walk test: 7.37 sec (4.45 ft/sec)  5 time sit to stand test: 9.03 sec  No falls, no changes in balance.  Patient does not require Physical Therapy services at this time.  Recommend Physical Therapy screen in 6-9 months.   [name] W. 07/13/2021, 1:17 PM Gean Maidens., PT  [facility] [address] [place], [place], [address] Phone: [phone]   Fax:  [phone]

## 2021-07-13 NOTE — Therapy (Signed)
[facility] [address] [place], [place], [address] Phone: [phone]   Fax:  [phone]  Patient Details  Name: Ian [name] MRN: [id number] Date of Birth: [birth year] Referring Provider:  Kerin Salen, DO  Encounter Date: 07/13/2021  Parkinson's Disease Speech Screen  Pt has noticed more dyskenesia in first hour of taking new medicine (Ongentys), and states that he also feels and hears more speech dysfluency during this time as well, but the dysfluency continues - but lessens in severity as in the first hour with medication (Ongentys). In fact pt took med 25 minutes ago and SLP witnessed multiple (4-6) phoneme repetitions x3 during the screen today.  SLP recommends evaluation of pt's speech for dysarthria/motor speech disorder.  Pt also reports biting his lip more often in the past 6 months that does not necessarily correspond to initiation of Ongentys.   [facility] ,MS, CCC-SLP  07/13/2021, 1:38 PM  [facility] [address] [place], [place], [address] Phone: [phone]   Fax:  [phone]

## 2021-07-14 NOTE — Therapy (Signed)
[facility] [address] [place], [place], [address] Phone: [phone]   Fax:  [phone]  Patient Details  Name: Ian [name] MRN: [id number] Date of Birth: [birth year] Referring Provider:  Tracey Harries, MD  Encounter Date: 07/13/2021 Occupational Therapy Parkinson's Disease Screen    Physical Performance Test item #2 (simulated eating):  14.97 sec  Physical Performance Test item #4 (donning/doffing jacket):  6.40 sec  Fastening/unfastening 3 buttons in:  20.50sec  9-hole peg test:    RUE  24.19 sec        LUE  30.59 sec    Change in ability to perform ADLs/IADLs:  no, handwriting is legible and moderate size.     Pt does not require occupational therapy services at this time.  Recommended occupational therapy screen in   6-8 months  [name] 07/14/2021, 4:30 PM  [facility] [address] [place], [place], [address] Phone: [phone]   Fax:  [phone]

## 2021-07-19 ENCOUNTER — Ambulatory Visit: Payer: Medicare Other | Attending: Family Medicine | Admitting: Speech Pathology

## 2021-07-19 ENCOUNTER — Other Ambulatory Visit: Payer: Self-pay

## 2021-07-19 ENCOUNTER — Encounter: Payer: Self-pay | Admitting: Speech Pathology

## 2021-07-19 DIAGNOSIS — R471 Dysarthria and anarthria: Secondary | ICD-10-CM | POA: Diagnosis not present

## 2021-07-19 NOTE — Patient Instructions (Signed)
  Download free metronome app and practice reading aloud set at 70 to 75  Then do description task saying 3 sentences about each word with metronome  When you have success with this, try tapping hand/finger at about 70 to 75 beats per minute without metronome and see how it goes. We are measuring stuttering moments  Self advocate/advertise when you need to make a phone call "I have trouble talking, please bear with me" to prepare the listener and take the pressure off of you.  Also pre practice what you want to say slowly with the metronome or  tapping before making the call  If you have something to tell your wife, pre practice it as well, tapping about 70 beats per minute

## 2021-07-20 NOTE — Therapy (Signed)
[facility] [address] [place], [place], [address] Phone: [phone]   Fax:  [phone]  Speech Language Pathology Evaluation  Patient Details  Name: Ian [name] MRN: [id number] Date of Birth: 12/27/1960 Referring Provider (SLP): Dr. Lurena Joiner [name]   Encounter Date: 07/19/2021   End of Session - 07/19/21 1437     Visit Number 1    Number of Visits 25    Date for SLP Re-Evaluation 10/11/21    SLP Start Time 1102    SLP Stop Time  1143    SLP Time Calculation (min) 41 min    Activity Tolerance Patient tolerated treatment well             Past Medical History:  Diagnosis Date   Anxiety    History of kidney stones    passed   PD (Parkinson's disease) (HCC) 09/10/2013   PONV (postoperative nausea and vomiting)    nausea     Past Surgical History:  Procedure Laterality Date   COLONOSCOPY     MINOR PLACEMENT OF FIDUCIAL N/A 01/31/2017   Procedure: MINOR PLACEMENT OF FIDUCIAL;  Surgeon: Maeola Harman, MD;  Location: [facility] OR;  Service: Neurosurgery;  Laterality: N/A;  Fiducial placement   none     PULSE GENERATOR IMPLANT Right 02/17/2017   Procedure: Right implantable pulse generator placement;  Surgeon: Maeola Harman, MD;  Location: [facility] OR;  Service: Neurosurgery;  Laterality: Right;  Bilateral implantable pulse generator placement   SUBTHALAMIC STIMULATOR BATTERY REPLACEMENT N/A 10/19/2018   Procedure: Deep Brain stimulator implantable pulse generator revision;  Surgeon: Maeola Harman, MD;  Location: [facility] OR;  Service: Neurosurgery;  Laterality: N/A;  Deep Brain stimulator implantable pulse generator revision   SUBTHALAMIC STIMULATOR INSERTION Bilateral 02/10/2017   Procedure: Bilateral Deep brain stimulator placement;  Surgeon: Maeola Harman, MD;  Location: [facility] OR;  Service: Neurosurgery;  Laterality: Bilateral;  Bilateral Deep brain stimulator placement   UPPER GASTROINTESTINAL ENDOSCOPY      There were no vitals filed  for this visit.   Subjective Assessment - 07/19/21 1106     Subjective "My speech has gotten worse"    Currently in Pain? No/denies                SLP Evaluation [facility] - 07/20/21 1825       SLP Visit Information   SLP Received On 07/19/21    Referring Provider (SLP) Dr. Lurena Joiner [name]    Onset Date 2014    Medical Diagnosis Parkinson's Disease      Subjective   Patient/Family Stated Goal "To be better understood"      Pain Assessment   Currently in Pain? No/denies      General Information   HPI Pt well know to [place] from prior courses of ST due to PD. PD dx 2014. S/p DBS 2018.    Mobility Status walks independently, passed most recent PT PD screen      Balance Screen   Has the patient fallen in the past 6 months No    Has the patient had a decrease in activity level because of a fear of falling?  No    Is the patient reluctant to leave their home because of a fear of falling?  No      Prior Functional Status   Cognitive/Linguistic Baseline Within functional limits    Type of Home House     Lives With Spouse    Available Support Family    Vocation On disability  Cognition   Overall Cognitive Status Within Functional Limits for tasks assessed      Auditory Comprehension   Overall Auditory Comprehension Appears within functional limits for tasks assessed      Verbal Expression   Overall Verbal Expression Appears within functional limits for tasks assessed      Oral Motor/Sensory Function   Overall Oral Motor/Sensory Function Appears within functional limits for tasks assessed      Motor Speech   Overall Motor Speech Impaired    Respiration Within functional limits    Phonation Normal    Resonance Within functional limits    Articulation Impaired    Level of Impairment Phrase    Intelligibility Intelligibility reduced    Word 75-100% accurate    Phrase 75-100% accurate    Sentence 50-74% accurate    Conversation 50-74% accurate    Motor Planning Impaired     Level of Impairment Phrase    Motor Speech Errors Aware    Effective Techniques Slow rate;Increased vocal intensity;Over-articulate;Pause;Pacing      Plan   Duration 12 weeks                             SLP Education - 07/19/21 1436     Education Details HEP for dysarthria, fluency, self advocate    Person(s) Educated Patient    Methods Explanation;Verbal cues;Handout;Demonstration    Comprehension Verbalized understanding;Returned demonstration;Verbal cues required;Need further instruction              SLP Short Term Goals - 07/20/21 1834       SLP SHORT TERM GOAL #1   Title pt will produce 18/20 sentences with reduced speech rate resulting in 5 or less dysfluencies over 3 sessions    Baseline 9 dysfluenices over 2 minute conversation    Time 6    Period Weeks    Status New      SLP SHORT TERM GOAL #2   Title pt will demo slower speech producing 90% intelligible speech and less than 5 dysfluencies in 7 minutes simple conversation with occasional min A    Time 6    Period Weeks    Status New      SLP SHORT TERM GOAL #3   Title pt will demo HEP for speech intelligibility with modified independence x3 sessions    Time 4    Period Weeks    Status New              SLP Long Term Goals - 07/20/21 1836       SLP LONG TERM GOAL #1   Title pt will demo slower speech producing >95% intelligible speech inand 5 or less dysfluencies over  15 minutes simple conversation with occasional min A over  x3 sessions    Time 12    Period Weeks    Status New      SLP LONG TERM GOAL #2   Title pt will report average no more than 7 requests of wife/friends/family asking him to repeat daily    Time 12    Period Weeks    Status New      SLP LONG TERM GOAL #3   Title Pt will report carrying over compensations for dysfluency/dysarthria to make 3 busisness phone calls    Baseline avoiding/having wife make calls for him    Time 12    Period Weeks     Status New  SLP LONG TERM GOAL #4   Title Pt will improve score on Communicative Effectiveness Survey by 2 points    Baseline 21/32    Time 12    Period Weeks    Status New              Plan - 07/19/21 1438     Clinical Impression Statement Ian [name] is referred to outpt ST by neurologist due to increasing  speech difficulty and difficulty being understood. He is well known to [place] from prior courses of ST with good improvement. Today he reports his wife asks him to repeat himself throughout the day. He presents with moderate hypokinetic dysarthria with fast rate, rushes of speech resulting in dysfluencies. Volume is mildly low, averaging 68dB. He feels his speech is affected by his medication cycle and states at this time his meds are "kicking in." in 3 minute conversation, Ian [name] with rapid rate and rushes of speech. In oral reading of Grandfather passagge, Ian [name] spontaneously used pauses and slowed rate to reduce to 5 dysfluent [name]. He reports he is avoiding making phone calls and asks his wife to make them for him. He "gets flustered" when people don't understand him, exacerbating rate and stuttering. He endorses that he is quieter now in group conversations, and avoids talking in groups. He scored a 21/32 on the Communicative Effectiveness Survey noting the most difficulty conversing in a noisy environment/social gathering, speaking when upset or angry, and conversing across a room or at a distance.    Speech Therapy Frequency 2x / week    Duration 12 weeks    Treatment/Interventions Functional tasks;SLP instruction and feedback;Compensatory strategies;Internal/external aids;Patient/family education;Cueing hierarchy;Environmental controls;Compensatory techniques             Patient will benefit from skilled therapeutic intervention in order to improve the following deficits and impairments:   Dysarthria and  anarthria    Problem List Patient Active Problem List   Diagnosis Date Noted   Tinea versicolor 05/28/2021   Left rotator cuff tear 08/20/2020   Nonallopathic lesion of sacral region 07/25/2018   Nonallopathic lesion of lumbosacral region 07/25/2018   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   Acute bursitis of right shoulder 12/06/2017   Antalgic gait 01/24/2017   Intercostal muscle tear 07/19/2016   PD (Parkinson's disease) (HCC) 06/24/2016   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

## 2021-07-21 ENCOUNTER — Encounter: Payer: Self-pay | Admitting: Speech Pathology

## 2021-07-21 ENCOUNTER — Ambulatory Visit: Payer: Medicare Other | Admitting: Speech Pathology

## 2021-07-21 ENCOUNTER — Other Ambulatory Visit: Payer: Self-pay

## 2021-07-21 DIAGNOSIS — R471 Dysarthria and anarthria: Secondary | ICD-10-CM | POA: Diagnosis not present

## 2021-07-21 NOTE — Therapy (Signed)
[facility] [address] [place], [place], [address] Phone: [phone]   Fax:  [phone]  Speech Language Pathology Treatment  Patient Details  Name: Ian [name] MRN: [id number] Date of Birth: 1961-07-21 Referring Provider (SLP): Dr. Lurena Joiner [name]   Encounter Date: 07/21/2021   End of Session - 07/21/21 1156     Visit Number 2    Number of Visits 25    Date for SLP Re-Evaluation 10/11/21    SLP Start Time 1100    SLP Stop Time  1141    SLP Time Calculation (min) 41 min    Activity Tolerance Patient tolerated treatment well             Past Medical History:  Diagnosis Date   Anxiety    History of kidney stones    passed   PD (Parkinson's disease) (HCC) 09/10/2013   PONV (postoperative nausea and vomiting)    nausea     Past Surgical History:  Procedure Laterality Date   COLONOSCOPY     MINOR PLACEMENT OF FIDUCIAL N/A 01/31/2017   Procedure: MINOR PLACEMENT OF FIDUCIAL;  Surgeon: Maeola Harman, MD;  Location: [facility] OR;  Service: Neurosurgery;  Laterality: N/A;  Fiducial placement   none     PULSE GENERATOR IMPLANT Right 02/17/2017   Procedure: Right implantable pulse generator placement;  Surgeon: Maeola Harman, MD;  Location: [facility] OR;  Service: Neurosurgery;  Laterality: Right;  Bilateral implantable pulse generator placement   SUBTHALAMIC STIMULATOR BATTERY REPLACEMENT N/A 10/19/2018   Procedure: Deep Brain stimulator implantable pulse generator revision;  Surgeon: Maeola Harman, MD;  Location: [facility] OR;  Service: Neurosurgery;  Laterality: N/A;  Deep Brain stimulator implantable pulse generator revision   SUBTHALAMIC STIMULATOR INSERTION Bilateral 02/10/2017   Procedure: Bilateral Deep brain stimulator placement;  Surgeon: Maeola Harman, MD;  Location: [facility] OR;  Service: Neurosurgery;  Laterality: Bilateral;  Bilateral Deep brain stimulator placement   UPPER GASTROINTESTINAL ENDOSCOPY      There were no vitals filed  for this visit.   Subjective Assessment - 07/21/21 1104     Subjective "I did the Wednesday class with Amy"    Currently in Pain? No/denies                   ADULT SLP TREATMENT - 07/21/21 1105       General Information   Behavior/Cognition Alert;Cooperative;Pleasant mood      Treatment Provided   Treatment provided Cognitive-Linquistic      Cognitive-Linquistic Treatment   Treatment focused on Dysarthria;Patient/family/caregiver education    Skilled Treatment Pt enters room rush of speech with dysfluencies. Used metronome at 70 bpm to recalibrate rate of speech in 3 paragraph reading task with no dysfluencies, 2 episodes of rushed speech with metromone reading with min A for awareness. Intiated foot tapping with metroome and faded to just tapping in reading task structured speech task. Mustaf generated 12 sentences with fluent speech using foot tap for pacing 1 word per tap. In simple conversation Ian [name] maintained fluent and intelligible speech with occasional min A for rate and 1 word per tap, as he sped up 3x over 9 minute conversation. Reviewed strategies of self advertising, pre-practicing and using tapping to make phone calls.      Assessment / Recommendations / Plan   Plan Continue with current plan of care      Progression Toward Goals   Progression toward goals Progressing toward goals  SLP Education - 07/21/21 1151     Education Details Conitnue practicing tapping after recalibrating with metronome    Person(s) Educated Patient    Methods Explanation;Verbal cues;Handout;Demonstration    Comprehension Verbalized understanding;Returned demonstration;Verbal cues required;Need further instruction              SLP Short Term Goals - 07/21/21 1155       SLP SHORT TERM GOAL #1   Title pt will produce 18/20 sentences with reduced speech rate resulting in 5 or less dysfluencies over 3 sessions    Baseline 9 dysfluenices over 2 minute  conversation    Time 6    Period Weeks    Status On-going      SLP SHORT TERM GOAL #2   Title pt will demo slower speech producing 90% intelligible speech and less than 5 dysfluencies in 7 minutes simple conversation with occasional min A    Time 6    Period Weeks    Status On-going      SLP SHORT TERM GOAL #3   Title pt will demo HEP for speech intelligibility with modified independence x3 sessions    Time 4    Period Weeks    Status On-going              SLP Long Term Goals - 07/21/21 1155       SLP LONG TERM GOAL #1   Title pt will demo slower speech producing >95% intelligible speech inand 5 or less dysfluencies over  15 minutes simple conversation with occasional min A over  x3 sessions    Time 12    Period Weeks    Status On-going      SLP LONG TERM GOAL #2   Title pt will report average no more than 7 requests of wife/friends/family asking him to repeat daily    Time 12    Period Weeks    Status On-going      SLP LONG TERM GOAL #3   Title Pt will report carrying over compensations for dysfluency/dysarthria to make 3 busisness phone calls    Baseline avoiding/having wife make calls for him    Time 12    Period Weeks    Status On-going      SLP LONG TERM GOAL #4   Title Pt will improve score on Communicative Effectiveness Survey by 2 points    Baseline 21/32    Time 12    Period Weeks    Status On-going              Plan - 07/21/21 1151     Clinical Impression Statement Initiated training in compensations for rapid speech rate and dysfluent speech. Utilized metronome to recalibrate slowed rate of speech, then introduced foot tapping 1 tap per word to successfully reduce rate and eliminate stutters in structured speech tasks. Pt reported he employed metronome at home to recalibrate rate of speech when he had exacerbation of dysfluent speech. Continue skilled ST to maximize intellgibility and participation in buisiness phone calls for independence.     Speech Therapy Frequency 2x / week    Duration 12 weeks    Treatment/Interventions Functional tasks;SLP instruction and feedback;Compensatory strategies;Internal/external aids;Patient/family education;Cueing hierarchy;Environmental controls;Compensatory techniques    Potential to Achieve Goals Good    Potential Considerations Severity of impairments;Medical prognosis             Patient will benefit from skilled therapeutic intervention in order to improve the following deficits and impairments:  Dysarthria and anarthria    Problem List Patient Active Problem List   Diagnosis Date Noted   Tinea versicolor 05/28/2021   Left rotator cuff tear 08/20/2020   Nonallopathic lesion of sacral region 07/25/2018   Nonallopathic lesion of lumbosacral region 07/25/2018   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   Acute bursitis of right shoulder 12/06/2017   Antalgic gait 01/24/2017   Intercostal muscle tear 07/19/2016   PD (Parkinson's disease) (HCC) 06/24/2016   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 [place], [place], [address] Phone: [phone]   Fax:  [phone]   Name: Ian [name] MRN: [id number] Date of Birth: 1961-09-18

## 2021-07-26 ENCOUNTER — Other Ambulatory Visit: Payer: Self-pay

## 2021-07-26 ENCOUNTER — Ambulatory Visit: Payer: Medicare Other

## 2021-07-26 DIAGNOSIS — R471 Dysarthria and anarthria: Secondary | ICD-10-CM | POA: Diagnosis not present

## 2021-07-26 NOTE — Patient Instructions (Signed)
  Please complete the assigned speech therapy homework prior to your next session and return it to the speech therapist at your next visit.  

## 2021-07-26 NOTE — Therapy (Signed)
[facility] [address] [place], [place], [address] Phone: [phone]   Fax:  [phone]  Speech Language Pathology Treatment  Patient Details  Name: Ian [name] MRN: [id number] Date of Birth: October 16, 1961 Referring Provider (SLP): Dr. Lurena Joiner [name]   Encounter Date: 07/26/2021   End of Session - 07/26/21 1655     Visit Number 3    Number of Visits 25    Date for SLP Re-Evaluation 10/11/21    SLP Start Time 1533    SLP Stop Time  1615    SLP Time Calculation (min) 42 min    Activity Tolerance Patient tolerated treatment well             Past Medical History:  Diagnosis Date   Anxiety    History of kidney stones    passed   PD (Parkinson's disease) (HCC) 09/10/2013   PONV (postoperative nausea and vomiting)    nausea     Past Surgical History:  Procedure Laterality Date   COLONOSCOPY     MINOR PLACEMENT OF FIDUCIAL N/A 01/31/2017   Procedure: MINOR PLACEMENT OF FIDUCIAL;  Surgeon: Maeola Harman, MD;  Location: [facility] OR;  Service: Neurosurgery;  Laterality: N/A;  Fiducial placement   none     PULSE GENERATOR IMPLANT Right 02/17/2017   Procedure: Right implantable pulse generator placement;  Surgeon: Maeola Harman, MD;  Location: [facility] OR;  Service: Neurosurgery;  Laterality: Right;  Bilateral implantable pulse generator placement   SUBTHALAMIC STIMULATOR BATTERY REPLACEMENT N/A 10/19/2018   Procedure: Deep Brain stimulator implantable pulse generator revision;  Surgeon: Maeola Harman, MD;  Location: [facility] OR;  Service: Neurosurgery;  Laterality: N/A;  Deep Brain stimulator implantable pulse generator revision   SUBTHALAMIC STIMULATOR INSERTION Bilateral 02/10/2017   Procedure: Bilateral Deep brain stimulator placement;  Surgeon: Maeola Harman, MD;  Location: [facility] OR;  Service: Neurosurgery;  Laterality: Bilateral;  Bilateral Deep brain stimulator placement   UPPER GASTROINTESTINAL ENDOSCOPY      There were no vitals filed  for this visit.          ADULT SLP TREATMENT - 07/26/21 1538       General Information   Behavior/Cognition Alert;Cooperative;Pleasant mood      Treatment Provided   Treatment provided Cognitive-Linquistic      Cognitive-Linquistic Treatment   Treatment focused on Dysarthria;Patient/family/caregiver education    Skilled Treatment "A lot of things affect my speech," pt states his speech is different at different times of the day. Pt enters room without rushes of speech without dysfluencies in greeting and salutary speech.  SLP used metronome at 70 bpm to recalibrate rate of speech in sentences and 2- sentence task in which pt kept up with metronome. In short conversational segments of 4-5 mintues pt demonstrated good ability to remain fluent with minor rushes of speech. Once SLP had to cue pt to repeat an utterance. Pt told SLP he did NOT self advertise because "My wife gave me the thumbs up so I knew I was doing good so I just didn't do it." SLP strongly encouraged pt to self advertise because pt endorsed that when the stress of whether or not he is going to stutter is alleviated pt can focus better on fluency enhancing techniques. Homework to make one call before next session USING SELF ADVERTISEMENT.      Assessment / Recommendations / Plan   Plan Continue with current plan of care      Progression Toward Goals   Progression toward goals  Progressing toward goals                SLP Short Term Goals - 07/26/21 1656       SLP SHORT TERM GOAL #1   Title pt will produce 18/20 sentences with reduced speech rate resulting in 5 or less dysfluencies over 3 sessions    Baseline 9 dysfluenices over 2 minute conversation; 07-26-21    Time 6    Period Weeks    Status On-going      SLP SHORT TERM GOAL #2   Title pt will demo slower speech producing 90% intelligible speech and less than 5 dysfluencies in 7 minutes simple conversation with occasional min A    Time 6    Period Weeks     Status On-going      SLP SHORT TERM GOAL #3   Title pt will demo HEP for speech intelligibility with modified independence x3 sessions    Time 4    Period Weeks    Status On-going              SLP Long Term Goals - 07/26/21 1656       SLP LONG TERM GOAL #1   Title pt will demo slower speech producing >95% intelligible speech inand 5 or less dysfluencies over  15 minutes simple conversation with occasional min A over  x3 sessions    Time 12    Period Weeks    Status On-going      SLP LONG TERM GOAL #2   Title pt will report average no more than 7 requests of wife/friends/family asking him to repeat daily    Time 12    Period Weeks    Status On-going      SLP LONG TERM GOAL #3   Title Pt will report carrying over compensations for dysfluency/dysarthria to make 3 busisness phone calls    Baseline avoiding/having wife make calls for him    Time 12    Period Weeks    Status On-going      SLP LONG TERM GOAL #4   Title Pt will improve score on Communicative Effectiveness Survey by 2 points    Baseline 21/32    Time 12    Period Weeks    Status On-going              Plan - 07/26/21 1655     Clinical Impression Statement Cont'd  training in compensations for rapid speech rate and dysfluent speech utilizing metronome to recalibrate slowed rate of speech. Pt did not require foot tapping today. Continue skilled ST to maximize intellgibility and participation in buisiness phone calls for independence.    Speech Therapy Frequency 2x / week    Duration 12 weeks    Treatment/Interventions Functional tasks;SLP instruction and feedback;Compensatory strategies;Internal/external aids;Patient/family education;Cueing hierarchy;Environmental controls;Compensatory techniques    Potential to Achieve Goals Good    Potential Considerations Severity of impairments;Medical prognosis             Patient will benefit from skilled therapeutic intervention in order to improve the  following deficits and impairments:   Dysarthria and anarthria    Problem List Patient Active Problem List   Diagnosis Date Noted   Tinea versicolor 05/28/2021   Left rotator cuff tear 08/20/2020   Nonallopathic lesion of sacral region 07/25/2018   Nonallopathic lesion of lumbosacral region 07/25/2018   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 [place], [place], [address] Phone: [phone]   Fax:  [phone]   Name: Bonny Egger MRN: [id number] Date of Birth: 03-Feb-1961

## 2021-07-28 ENCOUNTER — Encounter: Payer: Self-pay | Admitting: Speech Pathology

## 2021-07-28 ENCOUNTER — Other Ambulatory Visit: Payer: Self-pay

## 2021-07-28 ENCOUNTER — Ambulatory Visit: Payer: Medicare Other | Admitting: Speech Pathology

## 2021-07-28 DIAGNOSIS — R471 Dysarthria and anarthria: Secondary | ICD-10-CM | POA: Diagnosis not present

## 2021-07-28 NOTE — Patient Instructions (Signed)
  When you are having difficulty reducing your rate of speech, "phrasing" may be helpful  This means saying just a few words at a time, pausing, then saying a few more words  Break up your utterances with a pause every 3-5 words  Let Mrs. Douthat you are going to do this   Also prolonging vowels the first word may help slow you down and get out the word

## 2021-07-28 NOTE — Therapy (Signed)
[facility] [address] [place], [place], [address] Phone: [phone]   Fax:  [phone]  Speech Language Pathology Treatment  Patient Details  Name: Ian [name] MRN: [id number] Date of Birth: [birth year] Referring Provider (SLP): Dr. Lurena Joiner [name]   Encounter Date: 07/28/2021   End of Session - 07/28/21 1151     Visit Number 4    Number of Visits 25    Date for SLP Re-Evaluation 10/11/21    SLP Start Time 1103    SLP Stop Time  1145    SLP Time Calculation (min) 42 min    Activity Tolerance Patient tolerated treatment well             Past Medical History:  Diagnosis Date   Anxiety    History of kidney stones    passed   PD (Parkinson's disease) (HCC) 09/10/2013   PONV (postoperative nausea and vomiting)    nausea     Past Surgical History:  Procedure Laterality Date   COLONOSCOPY     MINOR PLACEMENT OF FIDUCIAL N/A 01/31/2017   Procedure: MINOR PLACEMENT OF FIDUCIAL;  Surgeon: Ian Harman, MD;  Location: [facility] OR;  Service: Neurosurgery;  Laterality: N/A;  Fiducial placement   none     PULSE GENERATOR IMPLANT Right 02/17/2017   Procedure: Right implantable pulse generator placement;  Surgeon: Ian Harman, MD;  Location: [facility] OR;  Service: Neurosurgery;  Laterality: Right;  Bilateral implantable pulse generator placement   SUBTHALAMIC STIMULATOR BATTERY REPLACEMENT N/A 10/19/2018   Procedure: Deep Brain stimulator implantable pulse generator revision;  Surgeon: Ian Harman, MD;  Location: [facility] OR;  Service: Neurosurgery;  Laterality: N/A;  Deep Brain stimulator implantable pulse generator revision   SUBTHALAMIC STIMULATOR INSERTION Bilateral 02/10/2017   Procedure: Bilateral Deep brain stimulator placement;  Surgeon: Ian Harman, MD;  Location: [facility] OR;  Service: Neurosurgery;  Laterality: Bilateral;  Bilateral Deep brain stimulator placement   UPPER GASTROINTESTINAL ENDOSCOPY      There were no vitals filed  for this visit.   Subjective Assessment - 07/28/21 1106     Subjective "Just slow reading"    Currently in Pain? No/denies                   ADULT SLP TREATMENT - 07/28/21 1107       General Information   Behavior/Cognition Alert;Cooperative;Pleasant mood      Treatment Provided   Treatment provided Cognitive-Linquistic      Cognitive-Linquistic Treatment   Treatment focused on Dysarthria;Patient/family/caregiver education    Skilled Treatment Ian [name] reports he made a successful phone call using tapping, being mindful of using slow speech he ordered taco dinners for him and his wife. Ian [name] used metronome set at 71 bpm on oral reading of 15 sentence paragraph with no stuttering. 2 epsidoes of rushed speech occured with Ian [name] it independently. In structured speech tasks, Ian [name] using heel tapping successfully to maintain fluent speech 15/15 sentences he generated. Introduced phrasing as another strategy to A with rate reduction/intelligilbity. With modeling and usual verbal cues, Ian [name] demonstrated phrasing in simple conversational responses twice.      Assessment / Recommendations / Plan   Plan Continue with current plan of care      Progression Toward Goals   Progression toward goals Progressing toward goals              SLP Education - 07/28/21 1148     Education Details phrasing as strategy to improve intelligilbility  Person(s) Educated Patient    Methods Explanation;Demonstration;Verbal cues;Handout    Comprehension Verbalized understanding;Returned demonstration;Verbal cues required;Need further instruction              SLP Short Term Goals - 07/28/21 1150       SLP SHORT TERM GOAL #1   Title pt will produce 18/20 sentences with reduced speech rate resulting in 5 or less dysfluencies over 3 sessions    Baseline 9 dysfluenices over 2 minute conversation; 07-26-21    Time 6    Period Weeks    Status On-going      SLP SHORT TERM GOAL #2    Title pt will demo slower speech producing 90% intelligible speech and less than 5 dysfluencies in 7 minutes simple conversation with occasional min A    Time 6    Period Weeks    Status On-going      SLP SHORT TERM GOAL #3   Title pt will demo HEP for speech intelligibility with modified independence x3 sessions    Time 4    Period Weeks    Status On-going              SLP Long Term Goals - 07/28/21 1151       SLP LONG TERM GOAL #1   Title pt will demo slower speech producing >95% intelligible speech inand 5 or less dysfluencies over  15 minutes simple conversation with occasional min A over  x3 sessions    Time 12    Period Weeks    Status On-going      SLP LONG TERM GOAL #2   Title pt will report average no more than 7 requests of wife/friends/family asking him to repeat daily    Time 12    Period Weeks    Status On-going      SLP LONG TERM GOAL #3   Title Pt will report carrying over compensations for dysfluency/dysarthria to make 3 busisness phone calls    Baseline avoiding/having wife make calls for him    Time 12    Period Weeks    Status On-going      SLP LONG TERM GOAL #4   Title Pt will improve score on Communicative Effectiveness Survey by 2 points    Baseline 21/32    Time 12    Period Weeks    Status On-going              Plan - 07/28/21 1149     Clinical Impression Statement Cont'd  training in compensations for rapid speech rate and dysfluent speech utilizing metronome to recalibrate slowed rate of speech. Introduced phrasing as strategy to improve intelligibility. Foot tapping practiced as well with success Continue skilled ST to maximize intellgibility and participation in buisiness phone calls for independence.    Speech Therapy Frequency 2x / week    Duration 12 weeks    Treatment/Interventions Functional tasks;SLP instruction and feedback;Compensatory strategies;Internal/external aids;Patient/family education;Cueing hierarchy;Environmental  controls;Compensatory techniques    Potential to Achieve Goals Good    Potential Considerations Severity of impairments;Medical prognosis             Patient will benefit from skilled therapeutic intervention in order to improve the following deficits and impairments:   Dysarthria and anarthria    Problem List Patient Active Problem List   Diagnosis Date Noted   Tinea versicolor 05/28/2021   Left rotator cuff tear 08/20/2020   Nonallopathic lesion of sacral region 07/25/2018   Nonallopathic lesion of lumbosacral  region 07/25/2018   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   Acute bursitis of right shoulder 12/06/2017   Antalgic gait 01/24/2017   Intercostal muscle tear 07/19/2016   PD (Parkinson's disease) (HCC) 06/24/2016   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 [place], [place], [address] Phone: [phone]   Fax:  [phone]   Name: Ian [name] MRN: [id number] Date of Birth: [birth year]

## 2021-08-02 ENCOUNTER — Other Ambulatory Visit: Payer: Self-pay

## 2021-08-02 ENCOUNTER — Ambulatory Visit: Payer: Medicare Other | Admitting: Speech Pathology

## 2021-08-02 ENCOUNTER — Encounter: Payer: Self-pay | Admitting: Speech Pathology

## 2021-08-02 DIAGNOSIS — R471 Dysarthria and anarthria: Secondary | ICD-10-CM | POA: Diagnosis not present

## 2021-08-02 NOTE — Therapy (Signed)
[facility] [address] [place], [place], [address] Phone: [phone]   Fax:  [phone]  Speech Language Pathology Treatment  Patient Details  Name: Ian [name] MRN: [id number] Date of Birth: 1961/01/24 Referring Provider (SLP): Dr. Lurena Joiner [name]   Encounter Date: 08/02/2021   End of Session - 08/02/21 1556     Visit Number 5    Number of Visits 25    Date for SLP Re-Evaluation 10/11/21    SLP Start Time 1318    SLP Stop Time  1357    SLP Time Calculation (min) 39 min             Past Medical History:  Diagnosis Date   Anxiety    History of kidney stones    passed   PD (Parkinson's disease) (HCC) 09/10/2013   PONV (postoperative nausea and vomiting)    nausea     Past Surgical History:  Procedure Laterality Date   COLONOSCOPY     MINOR PLACEMENT OF FIDUCIAL N/A 01/31/2017   Procedure: MINOR PLACEMENT OF FIDUCIAL;  Surgeon: Maeola Harman, MD;  Location: [facility] OR;  Service: Neurosurgery;  Laterality: N/A;  Fiducial placement   none     PULSE GENERATOR IMPLANT Right 02/17/2017   Procedure: Right implantable pulse generator placement;  Surgeon: Maeola Harman, MD;  Location: [facility] OR;  Service: Neurosurgery;  Laterality: Right;  Bilateral implantable pulse generator placement   SUBTHALAMIC STIMULATOR BATTERY REPLACEMENT N/A 10/19/2018   Procedure: Deep Brain stimulator implantable pulse generator revision;  Surgeon: Maeola Harman, MD;  Location: [facility] OR;  Service: Neurosurgery;  Laterality: N/A;  Deep Brain stimulator implantable pulse generator revision   SUBTHALAMIC STIMULATOR INSERTION Bilateral 02/10/2017   Procedure: Bilateral Deep brain stimulator placement;  Surgeon: Maeola Harman, MD;  Location: [facility] OR;  Service: Neurosurgery;  Laterality: Bilateral;  Bilateral Deep brain stimulator placement   UPPER GASTROINTESTINAL ENDOSCOPY      There were no vitals filed for this visit.   Subjective Assessment - 08/02/21  1320     Subjective 60-40  re: speech, 60 good, 40 bad    Currently in Pain? No/denies                   ADULT SLP TREATMENT - 08/02/21 1322       General Information   Behavior/Cognition Alert;Cooperative;Pleasant mood      Treatment Provided   Treatment provided Cognitive-Linquistic      Cognitive-Linquistic Treatment   Treatment focused on Dysarthria;Patient/family/caregiver education    Skilled Treatment Ian [name] took PD meds 15 minutes ago and has not kicked in yet. Utilized metronome with a reading task to re-calibrate rate with no dysfluencies. Progressed to simple conversation with metronome which was eventually faded out, agian without stutter or rush of speech.  Structured spontaneous speech task generating 3 sentence description usig tapping or phrasing with occasional min A to redcue rate and 3 episodes of dysfluency. Targeted use of tapping, phrasing or both in 20 minute conversation to maintain intelligible speech required consistent modeling, occasional min visual cues. Ian [name] self corrects dysfluent utterances with supervision cues. He reports practicing tapping and phrasing at home with his wife with some success,. however there are times when these strategies aren't successful depending on medication schedule. Ian [name] made another phone call to order food with success using tapping to slow rate and be understood.      Assessment / Recommendations / Plan   Plan Continue with current plan of care  Progression Toward Goals   Progression toward goals Progressing toward goals              SLP Education - 08/02/21 1554     Education Details compensations for slow rate and intelligiblity    Person(s) Educated Patient    Methods Explanation;Demonstration;Verbal cues    Comprehension Verbalized understanding;Returned demonstration;Verbal cues required              SLP Short Term Goals - 08/02/21 1555       SLP SHORT TERM GOAL #1   Title pt will produce  18/20 sentences with reduced speech rate resulting in 5 or less dysfluencies over 3 sessions    Baseline 9 dysfluenices over 2 minute conversation; 07-26-21; 08/02/21    Time 5    Period Weeks    Status On-going      SLP SHORT TERM GOAL #2   Title pt will demo slower speech producing 90% intelligible speech and less than 5 dysfluencies in 7 minutes simple conversation with occasional min A    Time 5    Period Weeks    Status On-going      SLP SHORT TERM GOAL #3   Title pt will demo HEP for speech intelligibility with modified independence x3 sessions    Time 3    Period Weeks    Status On-going              SLP Long Term Goals - 08/02/21 1555       SLP LONG TERM GOAL #1   Title pt will demo slower speech producing >95% intelligible speech inand 5 or less dysfluencies over  15 minutes simple conversation with occasional min A over  x3 sessions    Time 11    Period Weeks    Status On-going      SLP LONG TERM GOAL #2   Title pt will report average no more than 7 requests of wife/friends/family asking him to repeat daily    Time 11    Period Weeks    Status On-going      SLP LONG TERM GOAL #3   Title Pt will report carrying over compensations for dysfluency/dysarthria to make 3 busisness phone calls    Baseline avoiding/having wife make calls for him; 1 call 07/26/21, another 08/02/21;    Time 11    Period Weeks    Status On-going      SLP LONG TERM GOAL #4   Title Pt will improve score on Communicative Effectiveness Survey by 2 points    Baseline 21/32    Time 11    Period Weeks    Status On-going              Plan - 08/02/21 1554     Clinical Impression Statement Cont'd  training in compensations for rapid speech rate and dysfluent speech utilizing metronome to recalibrate slowed rate of speech. Introduced phrasing as strategy to improve intelligibility. Foot tapping practiced as well with success Continue skilled ST to maximize intellgibility and participation in  buisiness phone calls for independence.    Speech Therapy Frequency 2x / week    Duration 12 weeks    Treatment/Interventions Functional tasks;SLP instruction and feedback;Compensatory strategies;Internal/external aids;Patient/family education;Cueing hierarchy;Environmental controls;Compensatory techniques    Potential to Achieve Goals Good    Potential Considerations Severity of impairments;Medical prognosis             Patient will benefit from skilled therapeutic intervention in order to improve the following  deficits and impairments:   Dysarthria and anarthria    Problem List Patient Active Problem List   Diagnosis Date Noted   Tinea versicolor 05/28/2021   Left rotator cuff tear 08/20/2020   Nonallopathic lesion of sacral region 07/25/2018   Nonallopathic lesion of lumbosacral region 07/25/2018   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   Acute bursitis of right shoulder 12/06/2017   Antalgic gait 01/24/2017   Intercostal muscle tear 07/19/2016   PD (Parkinson's disease) (HCC) 06/24/2016   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 [place], [place], [address] Phone: [phone]   Fax:  [phone]   Name: Ian [name] MRN: [id number] Date of Birth: 1961-01-22

## 2021-08-04 ENCOUNTER — Ambulatory Visit: Payer: Medicare Other | Admitting: Speech Pathology

## 2021-08-04 ENCOUNTER — Encounter: Payer: Self-pay | Admitting: Speech Pathology

## 2021-08-04 ENCOUNTER — Other Ambulatory Visit: Payer: Self-pay

## 2021-08-04 DIAGNOSIS — R471 Dysarthria and anarthria: Secondary | ICD-10-CM | POA: Diagnosis not present

## 2021-08-04 MED ORDER — OPICAPONE 50 MG PO CAPS: 1.0000 | ORAL_CAPSULE | Freq: Every day | ORAL | 1 refills | Status: DC

## 2021-08-04 NOTE — Therapy (Signed)
[facility] [address] [place], [place], [address] Phone: [phone]   Fax:  [phone]  Speech Language Pathology Treatment  Patient Details  Name: Ian [name] MRN: [id number] Date of Birth: 1961-04-20 Referring Provider (SLP): Dr. Lurena Joiner [name]   Encounter Date: 08/04/2021   End of Session - 08/04/21 1320     Visit Number 6    Number of Visits 25    Date for SLP Re-Evaluation 10/11/21    SLP Start Time 1232    SLP Stop Time  1313    SLP Time Calculation (min) 41 min    Activity Tolerance Patient tolerated treatment well             Past Medical History:  Diagnosis Date   Anxiety    History of kidney stones    passed   PD (Parkinson's disease) (HCC) 09/10/2013   PONV (postoperative nausea and vomiting)    nausea     Past Surgical History:  Procedure Laterality Date   COLONOSCOPY     MINOR PLACEMENT OF FIDUCIAL N/A 01/31/2017   Procedure: MINOR PLACEMENT OF FIDUCIAL;  Surgeon: Maeola Harman, MD;  Location: [facility] OR;  Service: Neurosurgery;  Laterality: N/A;  Fiducial placement   none     PULSE GENERATOR IMPLANT Right 02/17/2017   Procedure: Right implantable pulse generator placement;  Surgeon: Maeola Harman, MD;  Location: [facility] OR;  Service: Neurosurgery;  Laterality: Right;  Bilateral implantable pulse generator placement   SUBTHALAMIC STIMULATOR BATTERY REPLACEMENT N/A 10/19/2018   Procedure: Deep Brain stimulator implantable pulse generator revision;  Surgeon: Maeola Harman, MD;  Location: [facility] OR;  Service: Neurosurgery;  Laterality: N/A;  Deep Brain stimulator implantable pulse generator revision   SUBTHALAMIC STIMULATOR INSERTION Bilateral 02/10/2017   Procedure: Bilateral Deep brain stimulator placement;  Surgeon: Maeola Harman, MD;  Location: [facility] OR;  Service: Neurosurgery;  Laterality: Bilateral;  Bilateral Deep brain stimulator placement   UPPER GASTROINTESTINAL ENDOSCOPY      There were no vitals filed  for this visit.   Subjective Assessment - 08/04/21 1238     Subjective I think it's still 60-40    Currently in Pain? No/denies                   ADULT SLP TREATMENT - 08/04/21 1239       General Information   Behavior/Cognition Alert;Cooperative;Pleasant mood      Treatment Provided   Treatment provided Cognitive-Linquistic      Cognitive-Linquistic Treatment   Treatment focused on Dysarthria;Patient/family/caregiver education    Skilled Treatment [name] reports practicing conversation with his wife yesterday and repeating utterances in full to self correct. He continues to use metronome to recalibrate speech rate when he is not able to slow rate with tapping and phrasing. Focused on phrasing in conversation with 4 episodes of rushed speech which Ian [name] required cues to reduce rate and return to phrasing over 20 minutes. 1 episode of stuttering Ian [name] self corrected Instructed him to practice his strategies throughout the day, not just when he feels like he is having more difficulty talking to habitualize intellgible rate.      Assessment / Recommendations / Plan   Plan Continue with current plan of care                SLP Short Term Goals - 08/04/21 1319       SLP SHORT TERM GOAL #1   Title pt will produce 18/20 sentences with reduced speech rate resulting  in 5 or less dysfluencies over 3 sessions    Baseline 9 dysfluenices over 2 minute conversation; 07-26-21; 08/02/21; 08/04/21    Time 5    Period Weeks    Status Achieved      SLP SHORT TERM GOAL #2   Title pt will demo slower speech producing 90% intelligible speech and less than 5 dysfluencies in 7 minutes simple conversation with occasional min A    Time 5    Period Weeks    Status Achieved      SLP SHORT TERM GOAL #3   Title pt will demo HEP for speech intelligibility with modified independence x3 sessions    Time 3    Period Weeks    Status On-going              SLP Long Term Goals - 08/04/21  1320       SLP LONG TERM GOAL #1   Title pt will demo slower speech producing >95% intelligible speech inand 5 or less dysfluencies over  15 minutes simple conversation with occasional min A over  x3 sessions    Time 11    Period Weeks    Status On-going      SLP LONG TERM GOAL #2   Title pt will report average no more than 7 requests of wife/friends/family asking him to repeat daily    Time 11    Period Weeks    Status On-going      SLP LONG TERM GOAL #3   Title Pt will report carrying over compensations for dysfluency/dysarthria to make 3 busisness phone calls    Baseline avoiding/having wife make calls for him; 1 call 07/26/21, another 08/02/21; 08/04/21    Time 11    Period Weeks    Status Achieved      SLP LONG TERM GOAL #4   Title Pt will improve score on Communicative Effectiveness Survey by 2 points    Baseline 21/32    Time 11    Period Weeks    Status On-going              Plan - 08/04/21 1316     Clinical Impression Statement Targeted phrasing in conversation to improve intellbility by reducing rate of speech and improving fluency. See skilled intervention. Ian [name] instructed to practice all of his strategies (metronome, tapping, phrasing) throughout the day to help habitualize appropriate rate of speech. Ian [name] continues to attempt rather than avoid business calls. Continue skilled ST to maximize intellgilbity and partcipation in buisiness phone calls for independence and QOL.    Speech Therapy Frequency 2x / week    Duration 12 weeks    Treatment/Interventions Functional tasks;SLP instruction and feedback;Compensatory strategies;Internal/external aids;Patient/family education;Cueing hierarchy;Environmental controls;Compensatory techniques    Potential to Achieve Goals Good    Potential Considerations Medical prognosis             Patient will benefit from skilled therapeutic intervention in order to improve the following deficits and impairments:    Dysarthria and anarthria    Problem List Patient Active Problem List   Diagnosis Date Noted   Tinea versicolor 05/28/2021   Left rotator cuff tear 08/20/2020   Nonallopathic lesion of sacral region 07/25/2018   Nonallopathic lesion of lumbosacral region 07/25/2018   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   Acute bursitis of right shoulder 12/06/2017   Antalgic gait 01/24/2017   Intercostal muscle tear 07/19/2016   PD (Parkinson's disease) (HCC) 06/24/2016   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

## 2021-08-09 ENCOUNTER — Other Ambulatory Visit: Payer: Self-pay

## 2021-08-09 ENCOUNTER — Encounter: Payer: Self-pay | Admitting: Speech Pathology

## 2021-08-09 ENCOUNTER — Ambulatory Visit: Payer: Medicare Other | Admitting: Speech Pathology

## 2021-08-09 DIAGNOSIS — R471 Dysarthria and anarthria: Secondary | ICD-10-CM

## 2021-08-09 NOTE — Patient Instructions (Signed)
  When you need to make a phone call and are having a harder day, feel free to write down what you want to say and put a line or space to help you use phrasing  For example:  Hi, I'm calling .[name]... To make an appointment....... For an oil change...Marland KitchenMarland KitchenMarland Kitchen  Can I come in ....... On Tuesday the 23rd...Marland KitchenMarland KitchenMarland Kitchen  My car is a....... 2015..... [employer] [id number]  Remember when your practicing 2-4 words at a time, not 7 words

## 2021-08-09 NOTE — Therapy (Signed)
[facility] [address] [place], [place], [address] Phone: [phone]   Fax:  [phone]  Speech Language Pathology Treatment  Patient Details  Name: Ian [name] MRN: [id number] Date of Birth: 03-Apr-1961 Referring Provider (SLP): Dr. Lurena Joiner [name]   Encounter Date: 08/09/2021   End of Session - 08/09/21 1153     Visit Number 7    Number of Visits 25    Date for SLP Re-Evaluation 10/11/21    SLP Start Time 1103    SLP Stop Time  1144    SLP Time Calculation (min) 41 min    Activity Tolerance Patient tolerated treatment well             Past Medical History:  Diagnosis Date   Anxiety    History of kidney stones    passed   PD (Parkinson's disease) (HCC) 09/10/2013   PONV (postoperative nausea and vomiting)    nausea     Past Surgical History:  Procedure Laterality Date   COLONOSCOPY     MINOR PLACEMENT OF FIDUCIAL N/A 01/31/2017   Procedure: MINOR PLACEMENT OF FIDUCIAL;  Surgeon: Maeola Harman, MD;  Location: [facility] OR;  Service: Neurosurgery;  Laterality: N/A;  Fiducial placement   none     PULSE GENERATOR IMPLANT Right 02/17/2017   Procedure: Right implantable pulse generator placement;  Surgeon: Maeola Harman, MD;  Location: [facility] OR;  Service: Neurosurgery;  Laterality: Right;  Bilateral implantable pulse generator placement   SUBTHALAMIC STIMULATOR BATTERY REPLACEMENT N/A 10/19/2018   Procedure: Deep Brain stimulator implantable pulse generator revision;  Surgeon: Maeola Harman, MD;  Location: [facility] OR;  Service: Neurosurgery;  Laterality: N/A;  Deep Brain stimulator implantable pulse generator revision   SUBTHALAMIC STIMULATOR INSERTION Bilateral 02/10/2017   Procedure: Bilateral Deep brain stimulator placement;  Surgeon: Maeola Harman, MD;  Location: [facility] OR;  Service: Neurosurgery;  Laterality: Bilateral;  Bilateral Deep brain stimulator placement   UPPER GASTROINTESTINAL ENDOSCOPY      There were no vitals filed  for this visit.   Subjective Assessment - 08/09/21 1108     Subjective "I'm changing my meds because I like they way the others made me feel"    Currently in Pain? No/denies                   ADULT SLP TREATMENT - 08/09/21 1108       General Information   Behavior/Cognition Alert;Cooperative;Pleasant mood      Treatment Provided   Treatment provided Cognitive-Linquistic      Cognitive-Linquistic Treatment   Treatment focused on Dysarthria;Patient/family/caregiver education    Skilled Treatment Pt reports speech is "better" and he is more mindful. He reports his wife endorsed that his speech was getting better, however he continues to have episodes where he can't control speech when meds are "off" Oral reading with metronome set at 70 to recalibrate rate with mod I and no dysfluencies. He corrected rate as needed x1 during passage independently. He has practiced phrasing mostly as this is his preferred  In simple conversation, today Ian [name] required usual min to mod A to speak in 2-4 word phrases. He continues to make phone calls to order food and make appointment for oil change. Instructed pt to use strategy of writing down what he wants to say with a line in between every 2-4 words to remind him to pause when he is making a call. .      Assessment / Recommendations / Plan   Plan  Continue with current plan of care      Progression Toward Goals   Progression toward goals Progressing toward goals              SLP Education - 08/09/21 1146     Education Details reduce phrase length in phrasing to 5 or less words    Person(s) Educated Patient    Methods Explanation;Demonstration;Verbal cues;Handout    Comprehension Verbalized understanding;Returned demonstration;Verbal cues required;Need further instruction              SLP Short Term Goals - 08/09/21 1152       SLP SHORT TERM GOAL #1   Title pt will produce 18/20 sentences with reduced speech rate resulting in  5 or less dysfluencies over 3 sessions    Baseline 9 dysfluenices over 2 minute conversation; 07-26-21; 08/02/21; 08/04/21    Time 5    Period Weeks    Status Achieved      SLP SHORT TERM GOAL #2   Title pt will demo slower speech producing 90% intelligible speech and less than 5 dysfluencies in 7 minutes simple conversation with occasional min A    Time 5    Period Weeks    Status Achieved      SLP SHORT TERM GOAL #3   Title pt will demo HEP for speech intelligibility with modified independence x3 sessions    Time 3    Period Weeks    Status Achieved              SLP Long Term Goals - 08/09/21 1153       SLP LONG TERM GOAL #1   Title pt will demo slower speech producing >95% intelligible speech inand 5 or less dysfluencies over  15 minutes simple conversation with occasional min A over  x3 sessions    Time 10    Period Weeks    Status On-going      SLP LONG TERM GOAL #2   Title pt will report average no more than 7 requests of wife/friends/family asking him to repeat daily    Time 10    Period Weeks    Status On-going      SLP LONG TERM GOAL #3   Title Pt will report carrying over compensations for dysfluency/dysarthria to make 3 busisness phone calls    Baseline avoiding/having wife make calls for him; 1 call 07/26/21, another 08/02/21; 08/04/21    Time 11    Period Weeks    Status Achieved      SLP LONG TERM GOAL #4   Title Pt will improve score on Communicative Effectiveness Survey by 2 points    Baseline 21/32    Time 11    Period Weeks    Status On-going              Plan - 08/09/21 1147     Clinical Impression Statement Ian [name] continues to practice tapping and phrasing to reduce rate of speech, improve intelligibility and reduce dysfluency. He prefers phrasing, so we targeted this in today's session. Ian [name] required usual verbal feedback to reduce length of phrase, as he would say 7-10 words or more, resulting in dysfluency. He continues to make business  phone calls with success. Encourage regular practice of tapping and metronome to be able to use these strategies readily should need arise. Continue skilled ST to maximize intelligilbity for QOL, independence and reduced frustration    Speech Therapy Frequency 2x / week    Duration 12  weeks    Treatment/Interventions Functional tasks;SLP instruction and feedback;Compensatory strategies;Internal/external aids;Patient/family education;Cueing hierarchy;Environmental controls;Compensatory techniques    Potential to Achieve Goals Good    Potential Considerations Medical prognosis             Patient will benefit from skilled therapeutic intervention in order to improve the following deficits and impairments:   Dysarthria and anarthria    Problem List Patient Active Problem List   Diagnosis Date Noted   Tinea versicolor 05/28/2021   Left rotator cuff tear 08/20/2020   Nonallopathic lesion of sacral region 07/25/2018   Nonallopathic lesion of lumbosacral region 07/25/2018   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   Acute bursitis of right shoulder 12/06/2017   Antalgic gait 01/24/2017   Intercostal muscle tear 07/19/2016   PD (Parkinson's disease) (HCC) 06/24/2016   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 [place], [place], [address] Phone: [phone]   Fax:  [phone]   Name: Aimar Borghi MRN: [id number] Date of Birth: 1961-03-01

## 2021-08-10 ENCOUNTER — Other Ambulatory Visit: Payer: Self-pay | Admitting: Neurology

## 2021-08-11 ENCOUNTER — Encounter: Payer: Self-pay | Admitting: Speech Pathology

## 2021-08-11 ENCOUNTER — Other Ambulatory Visit: Payer: Self-pay

## 2021-08-11 ENCOUNTER — Ambulatory Visit: Payer: Medicare Other | Admitting: Speech Pathology

## 2021-08-11 DIAGNOSIS — R471 Dysarthria and anarthria: Secondary | ICD-10-CM | POA: Diagnosis not present

## 2021-08-11 MED ORDER — CLONAZEPAM 0.5 MG PO TABS: ORAL_TABLET | ORAL | 1 refills | Status: DC

## 2021-08-11 NOTE — Therapy (Signed)
[facility] [address] [place], [place], [address] Phone: [phone]   Fax:  [phone]  Speech Language Pathology Treatment  Patient Details  Name: Ian [name] MRN: [id number] Date of Birth: Jun 29, 1961 Referring Provider (SLP): Dr. Lurena Joiner [name]   Encounter Date: 08/11/2021   End of Session - 08/11/21 1143     Visit Number 8    Date for SLP Re-Evaluation 10/11/21    SLP Start Time 1104    SLP Stop Time  1143    SLP Time Calculation (min) 39 min    Activity Tolerance Patient tolerated treatment well             Past Medical History:  Diagnosis Date   Anxiety    History of kidney stones    passed   PD (Parkinson's disease) (HCC) 09/10/2013   PONV (postoperative nausea and vomiting)    nausea     Past Surgical History:  Procedure Laterality Date   COLONOSCOPY     MINOR PLACEMENT OF FIDUCIAL N/A 01/31/2017   Procedure: MINOR PLACEMENT OF FIDUCIAL;  Surgeon: Maeola Harman, MD;  Location: [facility] OR;  Service: Neurosurgery;  Laterality: N/A;  Fiducial placement   none     PULSE GENERATOR IMPLANT Right 02/17/2017   Procedure: Right implantable pulse generator placement;  Surgeon: Maeola Harman, MD;  Location: [facility] OR;  Service: Neurosurgery;  Laterality: Right;  Bilateral implantable pulse generator placement   SUBTHALAMIC STIMULATOR BATTERY REPLACEMENT N/A 10/19/2018   Procedure: Deep Brain stimulator implantable pulse generator revision;  Surgeon: Maeola Harman, MD;  Location: [facility] OR;  Service: Neurosurgery;  Laterality: N/A;  Deep Brain stimulator implantable pulse generator revision   SUBTHALAMIC STIMULATOR INSERTION Bilateral 02/10/2017   Procedure: Bilateral Deep brain stimulator placement;  Surgeon: Maeola Harman, MD;  Location: [facility] OR;  Service: Neurosurgery;  Laterality: Bilateral;  Bilateral Deep brain stimulator placement   UPPER GASTROINTESTINAL ENDOSCOPY      There were no vitals filed for this visit.    Subjective Assessment - 08/11/21 1108     Subjective Slowly but surely re: med changes    Currently in Pain? No/denies                   ADULT SLP TREATMENT - 08/11/21 1108       General Information   Behavior/Cognition Alert;Cooperative;Pleasant mood      Treatment Provided   Treatment provided Cognitive-Linquistic      Cognitive-Linquistic Treatment   Treatment focused on Dysarthria;Patient/family/caregiver education    Skilled Treatment Pt continues to report variable speech performance depending on med schedule and fatigue. Recalibrated rate of speech with heel tap for each word  and reading task, with WNL rate and no dysflueny. Instructed [name] to practice all strategies at home, so he can use them readily when he needs them. Structured speech task generating 3-4 sentences with list of given words. [name] used tapping spontaneously and maintained rate with no dysfluencies. In conversation [name] utlized tapping and phrasing to maintain rate over 25 minutes.      Assessment / Recommendations / Plan   Plan Continue with current plan of care      Progression Toward Goals   Progression toward goals Progressing toward goals              SLP Education - 08/11/21 1138     Education Details Practice all strategies at home    Person(s) Educated Patient    Methods Explanation;Demonstration;Verbal cues;Handout    Comprehension  Verbalized understanding;Returned demonstration;Verbal cues required;Need further instruction              SLP Short Term Goals - 08/11/21 1142       SLP SHORT TERM GOAL #1   Title pt will produce 18/20 sentences with reduced speech rate resulting in 5 or less dysfluencies over 3 sessions    Baseline 9 dysfluenices over 2 minute conversation; 07-26-21; 08/02/21; 08/04/21    Time 5    Period Weeks    Status Achieved      SLP SHORT TERM GOAL #2   Title pt will demo slower speech producing 90% intelligible speech and less than 5 dysfluencies  in 7 minutes simple conversation with occasional min A    Time 5    Period Weeks    Status Achieved      SLP SHORT TERM GOAL #3   Title pt will demo HEP for speech intelligibility with modified independence x3 sessions    Time 3    Period Weeks    Status Achieved              SLP Long Term Goals - 08/11/21 1142       SLP LONG TERM GOAL #1   Title pt will demo slower speech producing >95% intelligible speech inand 5 or less dysfluencies over  15 minutes simple conversation with occasional min A over  x3 sessions    Baseline 08/09/21; 08/11/21    Time 10    Period Weeks    Status On-going      SLP LONG TERM GOAL #2   Title pt will report average no more than 7 requests of wife/friends/family asking him to repeat daily    Time 10    Period Weeks    Status On-going      SLP LONG TERM GOAL #3   Title Pt will report carrying over compensations for dysfluency/dysarthria to make 3 busisness phone calls    Baseline avoiding/having wife make calls for him; 1 call 07/26/21, another 08/02/21; 08/04/21    Time 11    Period Weeks    Status Achieved      SLP LONG TERM GOAL #4   Title Pt will improve score on Communicative Effectiveness Survey by 2 points    Baseline 21/32    Time 11    Period Weeks    Status On-going              Plan - 08/11/21 1140     Clinical Impression Statement [name] continues to practice tapping and phrasing to reduce rate of speech, improve intelligibility and reduce dysfluency. Targeted tapping today with 90% success. He continues to work with Dr. Arbutus Leas in changing meds which he thinks will help his spech.  [name] required usual verbal feedback to reduce length of phrase, as he would say 7-10 words or more, resulting in dysfluency. He continues to make business phone calls with success. Encourage regular practice of tapping and metronome to be able to use these strategies readily should need arise. Continue skilled ST to maximize intelligilbity for QOL,  independence and reduced frustration    Speech Therapy Frequency 2x / week    Duration 12 weeks    Treatment/Interventions Functional tasks;SLP instruction and feedback;Compensatory strategies;Internal/external aids;Patient/family education;Cueing hierarchy;Environmental controls;Compensatory techniques    Potential to Achieve Goals Good    Potential Considerations Medical prognosis             Patient will benefit from skilled therapeutic intervention in order  to improve the following deficits and impairments:   Dysarthria and anarthria    Problem List Patient Active Problem List   Diagnosis Date Noted   Tinea versicolor 05/28/2021   Left rotator cuff tear 08/20/2020   Nonallopathic lesion of sacral region 07/25/2018   Nonallopathic lesion of lumbosacral region 07/25/2018   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   Acute bursitis of right shoulder 12/06/2017   Antalgic gait 01/24/2017   Intercostal muscle tear 07/19/2016   PD (Parkinson's disease) (HCC) 06/24/2016   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 [place], [place], [address] Phone: [phone]   Fax:  [phone]   Name: Jabes Primo MRN: [id number] Date of Birth: 08/07/1961

## 2021-08-11 NOTE — Patient Instructions (Signed)
   Make sure you practice all of your strategies at home during practice time (metronome, tapping, phrasing 3-4 words) at different times to make sure you have all of the strategies readily available in your "tool kit"

## 2021-08-16 ENCOUNTER — Encounter: Payer: Self-pay | Admitting: Speech Pathology

## 2021-08-16 ENCOUNTER — Ambulatory Visit: Payer: Medicare Other | Admitting: Speech Pathology

## 2021-08-16 ENCOUNTER — Other Ambulatory Visit: Payer: Self-pay

## 2021-08-16 DIAGNOSIS — R471 Dysarthria and anarthria: Secondary | ICD-10-CM | POA: Diagnosis not present

## 2021-08-16 NOTE — Patient Instructions (Signed)
   Before you get together with people for dinner or event practice in the car very slow rate, reading or talking, to get in the rhythm  Keep practicing with metronome to recalibrate your rate   Practice phrasing as well as tapping during the day

## 2021-08-16 NOTE — Therapy (Signed)
[facility] [address] [place], [place], [address] Phone: [phone]   Fax:  [phone]  Speech Language Pathology Treatment  Patient Details  Name: Ian [name] MRN: [id number] Date of Birth: 01-03-1961 Referring Provider (SLP): Dr. Lurena Joiner [name]   Encounter Date: 08/16/2021   End of Session - 08/16/21 1159     Visit Number 9    Number of Visits 25    Date for SLP Re-Evaluation 10/11/21    SLP Start Time 1105    SLP Stop Time  1145    SLP Time Calculation (min) 40 min    Activity Tolerance Patient tolerated treatment well             Past Medical History:  Diagnosis Date   Anxiety    History of kidney stones    passed   PD (Parkinson's disease) (HCC) 09/10/2013   PONV (postoperative nausea and vomiting)    nausea     Past Surgical History:  Procedure Laterality Date   COLONOSCOPY     MINOR PLACEMENT OF FIDUCIAL N/A 01/31/2017   Procedure: MINOR PLACEMENT OF FIDUCIAL;  Surgeon: Maeola Harman, MD;  Location: [facility] OR;  Service: Neurosurgery;  Laterality: N/A;  Fiducial placement   none     PULSE GENERATOR IMPLANT Right 02/17/2017   Procedure: Right implantable pulse generator placement;  Surgeon: Maeola Harman, MD;  Location: [facility] OR;  Service: Neurosurgery;  Laterality: Right;  Bilateral implantable pulse generator placement   SUBTHALAMIC STIMULATOR BATTERY REPLACEMENT N/A 10/19/2018   Procedure: Deep Brain stimulator implantable pulse generator revision;  Surgeon: Maeola Harman, MD;  Location: [facility] OR;  Service: Neurosurgery;  Laterality: N/A;  Deep Brain stimulator implantable pulse generator revision   SUBTHALAMIC STIMULATOR INSERTION Bilateral 02/10/2017   Procedure: Bilateral Deep brain stimulator placement;  Surgeon: Maeola Harman, MD;  Location: [facility] OR;  Service: Neurosurgery;  Laterality: Bilateral;  Bilateral Deep brain stimulator placement   UPPER GASTROINTESTINAL ENDOSCOPY      There were no vitals filed  for this visit.   Subjective Assessment - 08/16/21 1109     Subjective "I think my speech has been pretty good considsering all of the changes I have had to adjust" re: med change    Currently in Pain? No/denies                   ADULT SLP TREATMENT - 08/16/21 1110       General Information   Behavior/Cognition Alert;Cooperative;Pleasant mood      Treatment Provided   Treatment provided Cognitive-Linquistic      Cognitive-Linquistic Treatment   Treatment focused on Dysarthria;Patient/family/caregiver education    Skilled Treatment Pt reports some improvement with med change. He reports success speaking and saying grace when they were out to dinner and brunch with friends. He continues to notice more trouble speaking when he first takes his meds. Rate fast with 2 stutters upon entering room. Metronome at 70 bpm and oral reading task to recalibrate rate of speech. When he got off beat,  [name] independently got back on beat. Rate slowed with not dysfluencies. [name] reports some difficulty "getting going' when he is going out to dinner or talking in a small group. Instructed him to practice slow rate on the way to an event or dinner in the car to recalibrate his voice and get in the rythm of slower rate.In structured task, [name] used phrasing to be intelligible 14/15 sentences with 1 self corretion for rapid rate/reduced intelligibility. In conversation,  [name] employed tapping, then phrasing with 3 episodes of rushed unintelligible speech with he corrected 2/3 mod I, 1 /3 with verbal cues.      Assessment / Recommendations / Plan   Plan Continue with current plan of care      Progression Toward Goals   Progression toward goals Progressing toward goals              SLP Education - 08/16/21 1152     Education Details prepractice slow rate on the way to an event or small group    Person(s) Educated Patient    Methods Explanation;Verbal cues;Handout    Comprehension  Verbalized understanding              SLP Short Term Goals - 08/16/21 1158       SLP SHORT TERM GOAL #1   Title pt will produce 18/20 sentences with reduced speech rate resulting in 5 or less dysfluencies over 3 sessions    Baseline 9 dysfluenices over 2 minute conversation; 07-26-21; 08/02/21; 08/04/21    Time 5    Period Weeks    Status Achieved      SLP SHORT TERM GOAL #2   Title pt will demo slower speech producing 90% intelligible speech and less than 5 dysfluencies in 7 minutes simple conversation with occasional min A    Time 5    Period Weeks    Status Achieved      SLP SHORT TERM GOAL #3   Title pt will demo HEP for speech intelligibility with modified independence x3 sessions    Time 3    Period Weeks    Status Achieved              SLP Long Term Goals - 08/16/21 1158       SLP LONG TERM GOAL #1   Title pt will demo slower speech producing >95% intelligible speech inand 5 or less dysfluencies over  15 minutes simple conversation with occasional min A over  x3 sessions    Baseline 08/09/21; 08/11/21    Time 9    Period Weeks    Status On-going      SLP LONG TERM GOAL #2   Title pt will report average no more than 7 requests of wife/friends/family asking him to repeat daily    Time 9    Period Weeks    Status On-going      SLP LONG TERM GOAL #3   Title Pt will report carrying over compensations for dysfluency/dysarthria to make 3 busisness phone calls    Baseline avoiding/having wife make calls for him; 1 call 07/26/21, another 08/02/21; 08/04/21    Time 11    Period Weeks    Status Achieved      SLP LONG TERM GOAL #4   Title Pt will improve score on Communicative Effectiveness Survey by 2 points    Baseline 21/32    Time 9    Period Weeks    Status On-going              Plan - 08/16/21 1153     Clinical Impression Statement [name] contnues to practice tapping, phrasing to reduce rate of speech and improve intelligibility and use metronome to  recalibrate rate of speech. Medication change completed Friday, 8/26 with some fatigue. [name] has reduced phrase length in phrasing to further improve intelligibility with occasional min A. Converation continues to require frequent modeling and visual/verbal cues to maintain appropriate rate and intelligibility. Continue skilled  ST to maximize intelligibility, participation in conversations and phone calls and QOL>    Speech Therapy Frequency 2x / week    Duration 12 weeks    Treatment/Interventions Functional tasks;SLP instruction and feedback;Compensatory strategies;Internal/external aids;Patient/family education;Cueing hierarchy;Environmental controls;Compensatory techniques    Potential to Achieve Goals Good             Patient will benefit from skilled therapeutic intervention in order to improve the following deficits and impairments:   Dysarthria and anarthria    Problem List Patient Active Problem List   Diagnosis Date Noted   Tinea versicolor 05/28/2021   Left rotator cuff tear 08/20/2020   Nonallopathic lesion of sacral region 07/25/2018   Nonallopathic lesion of lumbosacral region 07/25/2018   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   Acute bursitis of right shoulder 12/06/2017   Antalgic gait 01/24/2017   Intercostal muscle tear 07/19/2016   PD (Parkinson's disease) (HCC) 06/24/2016   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

## 2021-08-18 ENCOUNTER — Ambulatory Visit: Payer: Medicare Other | Admitting: Speech Pathology

## 2021-08-25 ENCOUNTER — Other Ambulatory Visit: Payer: Self-pay

## 2021-08-25 ENCOUNTER — Ambulatory Visit: Payer: Medicare Other | Attending: Family Medicine | Admitting: Speech Pathology

## 2021-08-25 ENCOUNTER — Encounter: Payer: Self-pay | Admitting: Speech Pathology

## 2021-08-25 DIAGNOSIS — R471 Dysarthria and anarthria: Secondary | ICD-10-CM | POA: Diagnosis present

## 2021-08-25 NOTE — Therapy (Signed)
[facility] [address] [place], [place], [address] Phone: [phone]   Fax:  [phone]  Speech Language Pathology Treatment  Patient Details  Name: Ian [name] MRN: [id number] Date of Birth: 1961-09-23 Referring Provider (SLP): Dr. Lurena Joiner [name]   Encounter Date: 08/25/2021   End of Session - 08/25/21 1202     Visit Number 10    Number of Visits 25    Date for SLP Re-Evaluation 10/11/21    SLP Start Time 1108   pt arrived late   SLP Stop Time  1144    SLP Time Calculation (min) 36 min    Activity Tolerance Patient tolerated treatment well             Past Medical History:  Diagnosis Date   Anxiety    History of kidney stones    passed   PD (Parkinson's disease) (HCC) 09/10/2013   PONV (postoperative nausea and vomiting)    nausea     Past Surgical History:  Procedure Laterality Date   COLONOSCOPY     MINOR PLACEMENT OF FIDUCIAL N/A 01/31/2017   Procedure: MINOR PLACEMENT OF FIDUCIAL;  Surgeon: Maeola Harman, MD;  Location: [facility] OR;  Service: Neurosurgery;  Laterality: N/A;  Fiducial placement   none     PULSE GENERATOR IMPLANT Right 02/17/2017   Procedure: Right implantable pulse generator placement;  Surgeon: Maeola Harman, MD;  Location: [facility] OR;  Service: Neurosurgery;  Laterality: Right;  Bilateral implantable pulse generator placement   SUBTHALAMIC STIMULATOR BATTERY REPLACEMENT N/A 10/19/2018   Procedure: Deep Brain stimulator implantable pulse generator revision;  Surgeon: Maeola Harman, MD;  Location: [facility] OR;  Service: Neurosurgery;  Laterality: N/A;  Deep Brain stimulator implantable pulse generator revision   SUBTHALAMIC STIMULATOR INSERTION Bilateral 02/10/2017   Procedure: Bilateral Deep brain stimulator placement;  Surgeon: Maeola Harman, MD;  Location: [facility] OR;  Service: Neurosurgery;  Laterality: Bilateral;  Bilateral Deep brain stimulator placement   UPPER GASTROINTESTINAL ENDOSCOPY      There  were no vitals filed for this visit.   Subjective Assessment - 08/25/21 1110     Subjective "We got very busy over the weekend" re: not consistent wth HEP    Currently in Pain? 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 Today, 3 episodes of dysfluent speech due to rapid rate over 45 minute session. This is improved from 9 episodes of dysfluency over 3 minutes on day of  eval. Pt self correcting in ST with rare min A  Goal Update: Continue goals  Plan: Continue POC  Reason Skilled Services are Required: Ian [name] required continued speech therapy to carryover reduced rate of speech and strategies to improve intelligibility outside of ST across community setting more consistently     ADULT SLP TREATMENT - 08/25/21 1112       General Information   Behavior/Cognition Alert;Cooperative;Pleasant mood      Treatment Provided   Treatment provided Cognitive-Linquistic      Cognitive-Linquistic Treatment   Treatment focused on Dysarthria;Patient/family/caregiver education    Skilled Treatment Ian [name] continues to report variable speech rates, with overall improvement. He enters room with some dysfluencies and fast rate today. Recalbrated  rate reading article with metronome set a 70dB with out dysfluency. He reports success using phrasing when he notices his rate speed up. Today, in conversation [place] maintained slow rate with usual min verbal cues and modeling. As he reports difficulty getting control of rate at times, but prefers phrasing, targeted taking a sniff at his pauses to ensure that he is taking a 1-2 second pause every 3-5 words. 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 Encouraged him to continue this, as well as mental rehearsing of names, children, occupation and othe persoanlly relevant information about others he will be with in social situations to reduce cognitive load in the moment to be able to focus on speech with more ease.      Assessment / Recommendations / Plan   Plan Continue with current plan of care      Progression Toward Goals   Progression toward goals Progressing toward goals              SLP Education - 08/25/21 1150     Education Details take sniff during pause when pausing, continue to rehearse phone calls and social situation to reduce cognitive load in the moment. Practice slow rate on the way to answer the phone    Person(s) Educated Patient    Methods Explanation;Demonstration;Verbal cues;Handout    Comprehension Verbalized understanding;Returned demonstration;Verbal cues required              SLP Short Term Goals - 08/25/21 1201       SLP SHORT TERM GOAL #1   Title pt will produce 18/20 sentences with reduced speech rate resulting in 5 or less dysfluencies over 3 sessions    Baseline 9 dysfluenices over 2 minute conversation; 07-26-21; 08/02/21; 08/04/21    Time 5    Period Weeks    Status Achieved      SLP SHORT TERM GOAL #2   Title pt will demo slower speech producing 90% intelligible speech and less than 5 dysfluencies in 7 minutes simple conversation with occasional min A    Time 5    Period Weeks    Status Achieved      SLP SHORT TERM GOAL #3   Title pt will demo HEP for speech intelligibility with modified independence x3 sessions    Time 3    Period Weeks    Status Achieved              SLP Long Term Goals - 08/25/21 1202       SLP LONG TERM GOAL #1   Title pt will demo slower speech producing >95% intelligible speech inand 5 or less dysfluencies over  15 minutes simple conversation with occasional min A over  x3 sessions    Baseline 08/09/21; 08/11/21; 08/25/21     Time 8    Period Weeks    Status Achieved      SLP LONG TERM GOAL #2   Title pt will report average no more than 7 requests of wife/friends/family asking him to repeat daily    Time 8    Period Weeks    Status On-going      SLP LONG TERM GOAL #3   Title Pt will report carrying over compensations for dysfluency/dysarthria to make 3 busisness phone calls    Baseline avoiding/having wife make calls for him; 1 call 07/26/21, another 08/02/21; 08/04/21    Time 11    Period Weeks    Status Achieved  SLP LONG TERM GOAL #4   Title Pt will improve score on Communicative Effectiveness Survey by 2 points    Baseline 21/32    Time 8    Period Weeks    Status On-going              Plan - 08/25/21 1152     Clinical Impression Statement Ian [name] continues to report being able to control his rate of speech inconsistently. He is keeping a log of speech success n relation to medication changes and feels medication change has helped his speech. Ian [name] has successfully carried over the strategy of "rehearsing" a business phone call to improve intellgibility over the phone. Today, we targeted taking sniff or breath during his pause when phrasing to ensure he gets an adequate pause to reduce rate and targeted mentally rehearsing pertinent information about communication partners in social settings to reduce cognitive load in the moment so he can focus on speech with more ease. Continue skilled ST to maximize intellgibilty, particiation in conversations and phone calls and QOL.    Speech Therapy Frequency 2x / week    Duration 12 weeks    Treatment/Interventions Functional tasks;SLP instruction and feedback;Compensatory strategies;Internal/external aids;Patient/family education;Cueing hierarchy;Environmental controls;Compensatory techniques    Potential to Achieve Goals Good    Potential Considerations Medical prognosis             Patient will benefit from skilled therapeutic intervention in  order to improve the following deficits and impairments:   Dysarthria and anarthria    Problem List Patient Active Problem List   Diagnosis Date Noted   Tinea versicolor 05/28/2021   Left rotator cuff tear 08/20/2020   Nonallopathic lesion of sacral region 07/25/2018   Nonallopathic lesion of lumbosacral region 07/25/2018   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   Acute bursitis of right shoulder 12/06/2017   Antalgic gait 01/24/2017   Intercostal muscle tear 07/19/2016   PD (Parkinson's disease) (HCC) 06/24/2016   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

## 2021-08-25 NOTE — Patient Instructions (Signed)
   This week, practice taking a sniff or breath when you use pausing to ensure you are getting a 1-2 second pause every 3-5 words  Practice, practice practice all of your strategies  Randie Heinz job rehearsing your phone calls!!  Do a mental rehearsal of names, kids, jobs, vacations, etc before a party, meeting, dinner etc to reduce the cognitive load and focus on you speech rather than taking your brain's energy for memory or processing  On you way to answer the phone, say aloud slowly "the phone is ringing, I'm going to answer the phone slowly. Hello...how are you"

## 2021-08-27 NOTE — Progress Notes (Signed)
[name] [address] [place] [address] Phone: [phone] Subjective:   [name], am serving as a scribe for Dr. Antoine Primas. This visit occurred during the SARS-CoV-2 public health emergency.  Safety protocols were in place, including screening questions prior to the visit, additional usage of staff PPE, and extensive cleaning of exam room while observing appropriate contact time as indicated for disinfecting solutions.   I'm seeing this patient by the request  of:  Tracey Harries, MD  CC:  Follow-up for back and neck pain  [id number]  Romualdo Prosise is a 60 y.o. male coming in with complaint of back and neck pain. OMT on 05/28/2021. Patient states he's doing well, but still has pain every now and again. He has no new complaints.  Patient has been active.  Still having some difficulty holding his weight patient states.  But nothing severe.  Otherwise been very active at the moment.  Medications patient has been prescribed: None  Taking:          Past Medical History:  Diagnosis Date   Anxiety    History of kidney stones    passed   PD (Parkinson's disease) (HCC) 09/10/2013   PONV (postoperative nausea and vomiting)    nausea     No Known Allergies   Review of Systems:  No headache, visual changes, nausea, vomiting, diarrhea, constipation, dizziness, abdominal pain, skin rash, fevers, chills, night sweats, weight loss, swollen lymph nodes, body aches, joint swelling, chest pain, shortness of breath, mood changes. POSITIVE muscle aches  Objective  Blood pressure 110/68, pulse 81, height 6\' 2"  (1.88 m), weight 164 lb (74.4 kg), SpO2 97 %.   General: No apparent distress alert and oriented x3 mood and affect normal, dressed appropriately.  HEENT: Pupils equal, extraocular movements intact  Respiratory: Patient's speak in full sentences and does not appear short of breath  Cardiovascular: No lower extremity edema, non  tender, no erythema  Neuro: Cranial nerves II through XII are intact, neurovascularly intact in all extremities with 2+ DTRs and 2+ pulses.  Gait normal with good balance and coordination.  MSK:  Non tender with full range of motion and good stability and symmetric strength and tone of shoulders, elbows, wrist, hip, knee and ankles bilaterally.  Back - Normal skin, Spine with normal alignment and no deformity.  No tenderness to vertebral process palpation.  Paraspinous muscles are not tender and without spasm.   Range of motion is full at neck and lumbar sacral regions  Osteopathic findings  C2 flexed rotated and side bent right C7 flexed rotated and side bent left T3 extended rotated and side bent right inhaled rib T9 extended rotated and side bent left L2 flexed rotated and side bent right Sacrum right on right       Assessment and Plan:  Slipped rib syndrome 1 patient doing relatively well overall.  Does have some tightness of the upper back but seems to be stable.  Patient continues though to seem to potentially lose weight.  Encourage patient to eat more regularly and make sure patient is able to increase activity.  Follow-up again 6 weeks  PD (Parkinson's disease) (HCC) Do feel that the hypertonicity secondary to the Parkinson's likely contribute to some of it.  Discussed icing regimen and home exercises.  Discussed avoiding certain activities.  Follow-up again 6 weeks   Nonallopathic problems  Decision today to treat with OMT was based on Physical Exam  After verbal consent  patient was treated with HVLA, ME, FPR techniques in cervical, rib, thoracic, lumbar, and sacral  areas  Patient tolerated the procedure well with improvement in symptoms  Patient given exercises, stretches and lifestyle modifications  See medications in patient instructions if given  Patient will follow up in 4-8 weeks      The above documentation has been reviewed and is accurate and complete  Judi Saa, DO        Note: This dictation was prepared with Dragon dictation along with smaller phrase technology. Any transcriptional errors that result from this process are unintentional.

## 2021-08-30 ENCOUNTER — Other Ambulatory Visit: Payer: Self-pay

## 2021-08-30 ENCOUNTER — Ambulatory Visit (INDEPENDENT_AMBULATORY_CARE_PROVIDER_SITE_OTHER): Payer: Medicare Other | Admitting: Family Medicine

## 2021-08-30 ENCOUNTER — Ambulatory Visit: Payer: Medicare Other | Admitting: Speech Pathology

## 2021-08-30 VITALS — BP 110/68 | HR 81 | Ht 74.0 in | Wt 164.0 lb

## 2021-08-30 DIAGNOSIS — M9904 Segmental and somatic dysfunction of sacral region: Secondary | ICD-10-CM | POA: Diagnosis not present

## 2021-08-30 DIAGNOSIS — M940 Chondrocostal junction syndrome [Tietze]: Secondary | ICD-10-CM

## 2021-08-30 DIAGNOSIS — M9901 Segmental and somatic dysfunction of cervical region: Secondary | ICD-10-CM

## 2021-08-30 DIAGNOSIS — G20 Parkinson's disease: Secondary | ICD-10-CM | POA: Diagnosis not present

## 2021-08-30 DIAGNOSIS — M9902 Segmental and somatic dysfunction of thoracic region: Secondary | ICD-10-CM | POA: Diagnosis not present

## 2021-08-30 DIAGNOSIS — M9908 Segmental and somatic dysfunction of rib cage: Secondary | ICD-10-CM

## 2021-08-30 DIAGNOSIS — M9903 Segmental and somatic dysfunction of lumbar region: Secondary | ICD-10-CM

## 2021-08-30 DIAGNOSIS — R471 Dysarthria and anarthria: Secondary | ICD-10-CM

## 2021-08-30 NOTE — Therapy (Signed)
[facility] [address] [place], [place], [address] Phone: [phone]   Fax:  [phone]  Speech Language Pathology Treatment  Patient Details  Name: Ian [name] MRN: [id number] Date of Birth: [birth year] Referring Provider (SLP): Dr. Lurena Joiner [name]   Encounter Date: 08/30/2021   End of Session - 08/30/21 1149     Visit Number 11    Number of Visits 25    Date for SLP Re-Evaluation 10/11/21    SLP Start Time 1102    SLP Stop Time  1141    SLP Time Calculation (min) 39 min             Past Medical History:  Diagnosis Date   Anxiety    History of kidney stones    passed   PD (Parkinson's disease) (HCC) 09/10/2013   PONV (postoperative nausea and vomiting)    nausea     Past Surgical History:  Procedure Laterality Date   COLONOSCOPY     MINOR PLACEMENT OF FIDUCIAL N/A 01/31/2017   Procedure: MINOR PLACEMENT OF FIDUCIAL;  Surgeon: Maeola Harman, MD;  Location: [facility] OR;  Service: Neurosurgery;  Laterality: N/A;  Fiducial placement   none     PULSE GENERATOR IMPLANT Right 02/17/2017   Procedure: Right implantable pulse generator placement;  Surgeon: Maeola Harman, MD;  Location: [facility] OR;  Service: Neurosurgery;  Laterality: Right;  Bilateral implantable pulse generator placement   SUBTHALAMIC STIMULATOR BATTERY REPLACEMENT N/A 10/19/2018   Procedure: Deep Brain stimulator implantable pulse generator revision;  Surgeon: Maeola Harman, MD;  Location: [facility] OR;  Service: Neurosurgery;  Laterality: N/A;  Deep Brain stimulator implantable pulse generator revision   SUBTHALAMIC STIMULATOR INSERTION Bilateral 02/10/2017   Procedure: Bilateral Deep brain stimulator placement;  Surgeon: Maeola Harman, MD;  Location: [facility] OR;  Service: Neurosurgery;  Laterality: Bilateral;  Bilateral Deep brain stimulator placement   UPPER GASTROINTESTINAL ENDOSCOPY      There were no vitals filed for this visit.   Subjective Assessment - 08/30/21  1110     Subjective "It was a stressful speaking weekend but I did well"    Currently in Pain? No/denies                   ADULT SLP TREATMENT - 08/30/21 1111       General Information   Behavior/Cognition Alert;Cooperative;Pleasant mood      Treatment Provided   Treatment provided Cognitive-Linquistic      Cognitive-Linquistic Treatment   Treatment focused on Dysarthria;Patient/family/caregiver education    Skilled Treatment Ian [name] reports success at [employer]. He carried over strategy of pre-practicing phrases he would say on the way there, as well as mentally rehearsing names and pertinent information about people who were attending to reduce cognitive load . He continues to report epsiodes of not being able to conrol his speech with tapping or phrasing. Today, we targeted spontanteous speech using metronome with occasional min A to stay on beat. This was successful to maintain intelligiblity. Ian [name] is amenable to trying the metronome with his wife when he is struggling to redeuce rate and be intelligible. Ian [name] will keep tally of requests for repeats this week      Assessment / Recommendations / Plan   Plan Continue with current plan of care      Progression Toward Goals   Progression toward goals Progressing toward goals              SLP Education - 08/30/21 1150  Education Details metronome in conversation at [place]              SLP Short Term Goals - 08/30/21 1148       SLP SHORT TERM GOAL #1   Title pt will produce 18/20 sentences with reduced speech rate resulting in 5 or less dysfluencies over 3 sessions    Baseline 9 dysfluenices over 2 minute conversation; 07-26-21; 08/02/21; 08/04/21    Time 5    Period Weeks    Status Achieved      SLP SHORT TERM GOAL #2   Title pt will demo slower speech producing 90% intelligible speech and less than 5 dysfluencies in 7 minutes simple conversation with occasional min A    Time 5    Period  Weeks    Status Achieved      SLP SHORT TERM GOAL #3   Title pt will demo HEP for speech intelligibility with modified independence x3 sessions    Time 3    Period Weeks    Status Achieved              SLP Long Term Goals - 08/30/21 1149       SLP LONG TERM GOAL #1   Title pt will demo slower speech producing >95% intelligible speech inand 5 or less dysfluencies over  15 minutes simple conversation with occasional min A over  x3 sessions    Baseline 08/09/21; 08/11/21; 08/25/21    Time 8    Period Weeks    Status Achieved      SLP LONG TERM GOAL #2   Title pt will report average no more than 7 requests of wife/friends/family asking him to repeat daily    Time 7    Period Weeks    Status On-going      SLP LONG TERM GOAL #3   Title Pt will report carrying over compensations for dysfluency/dysarthria to make 3 busisness phone calls    Baseline avoiding/having wife make calls for him; 1 call 07/26/21, another 08/02/21; 08/04/21    Time 11    Period Weeks    Status Achieved      SLP LONG TERM GOAL #4   Title Pt will improve score on Communicative Effectiveness Survey by 2 points    Baseline 21/32    Time 7    Period Weeks    Status On-going              Plan - 08/30/21 1140     Clinical Impression Statement Ian [name] reports some improvement in speech intellgilbity, however times where he can't reduce his rate at all. Targeted use of metronome in conversation with no rushes of speech and 100% intelligiblity. Although metronome is not practical for use outside of the home, Ian [name] agrees to try metronome with his spouse when he can't control rate with phrasing or tapping. He reports success taking a quick sniff during pauses when he is using phrasing to ensure pause has occurred. He also reports success rehearsing names and information prior to socializing and pre-practicing phrases with slow rate on the way to events to reduce cognitive load and recalibrate rate. Continue skilled  ST to maximize intelligiblity for participation in conversations, phone calls and QOL.    Speech Therapy Frequency 2x / week    Duration 12 weeks    Treatment/Interventions Functional tasks;SLP instruction and feedback;Compensatory strategies;Internal/external aids;Patient/family education;Cueing hierarchy;Environmental controls;Compensatory techniques    Potential to Achieve Goals Good    Potential Considerations Medical prognosis  Patient will benefit from skilled therapeutic intervention in order to improve the following deficits and impairments:   Dysarthria and anarthria    Problem List Patient Active Problem List   Diagnosis Date Noted   Tinea versicolor 05/28/2021   Left rotator cuff tear 08/20/2020   Nonallopathic lesion of sacral region 07/25/2018   Nonallopathic lesion of lumbosacral region 07/25/2018   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   Acute bursitis of right shoulder 12/06/2017   Antalgic gait 01/24/2017   Intercostal muscle tear 07/19/2016   PD (Parkinson's disease) (HCC) 06/24/2016   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 [place], [place], [address] Phone: [phone]   Fax:  [phone]   Name: Ian [name] MRN: [id number] Date of Birth: May 08, 1961

## 2021-08-30 NOTE — Assessment & Plan Note (Signed)
1 patient doing relatively well overall.  Does have some tightness of the upper back but seems to be stable.  Patient continues though to seem to potentially lose weight.  Encourage patient to eat more regularly and make sure patient is able to increase activity.  Follow-up again 6 weeks

## 2021-08-30 NOTE — Patient Instructions (Signed)
   Tally requests for repetition  I wrote a goal for 7 or less requests for repetition - we can average the good and bad days  When you are really struggling, try metronome when you are conversing with Sheralyn Boatman

## 2021-08-30 NOTE — Patient Instructions (Signed)
Good to see you! Have fun in DR See you again in 5-7 weeks

## 2021-08-30 NOTE — Assessment & Plan Note (Signed)
Do feel that the hypertonicity secondary to the Parkinson's likely contribute to some of it.  Discussed icing regimen and home exercises.  Discussed avoiding certain activities.  Follow-up again 6 weeks

## 2021-09-01 ENCOUNTER — Ambulatory Visit: Payer: Medicare Other

## 2021-09-06 ENCOUNTER — Ambulatory Visit: Payer: Medicare Other | Admitting: Speech Pathology

## 2021-09-06 ENCOUNTER — Other Ambulatory Visit: Payer: Self-pay

## 2021-09-06 ENCOUNTER — Encounter: Payer: Self-pay | Admitting: Speech Pathology

## 2021-09-06 DIAGNOSIS — R471 Dysarthria and anarthria: Secondary | ICD-10-CM | POA: Diagnosis not present

## 2021-09-06 NOTE — Patient Instructions (Addendum)
   Speech Easy PD website  Set metronome for a little faster and practice over the phone - let your sister know you are doing this  Record yourself practicing with Sheralyn Boatman - get some feedback and let me know how it goes

## 2021-09-06 NOTE — Therapy (Signed)
[facility] [address] [place], [place], [address] Phone: [phone]   Fax:  [phone]  Speech Language Pathology Treatment  Patient Details  Name: Ian [name] MRN: [id number] Date of Birth: 14-Oct-1961 Referring Provider (SLP): Dr. Lurena Joiner [name]   Encounter Date: 09/06/2021   End of Session - 09/06/21 1158     Visit Number 12    Number of Visits 25    Date for SLP Re-Evaluation 10/11/21    SLP Start Time 1104    SLP Stop Time  1145    SLP Time Calculation (min) 41 min    Activity Tolerance Patient tolerated treatment well             Past Medical History:  Diagnosis Date   Anxiety    History of kidney stones    passed   PD (Parkinson's disease) (HCC) 09/10/2013   PONV (postoperative nausea and vomiting)    nausea     Past Surgical History:  Procedure Laterality Date   COLONOSCOPY     MINOR PLACEMENT OF FIDUCIAL N/A 01/31/2017   Procedure: MINOR PLACEMENT OF FIDUCIAL;  Surgeon: Maeola Harman, MD;  Location: [facility] OR;  Service: Neurosurgery;  Laterality: N/A;  Fiducial placement   none     PULSE GENERATOR IMPLANT Right 02/17/2017   Procedure: Right implantable pulse generator placement;  Surgeon: Maeola Harman, MD;  Location: [facility] OR;  Service: Neurosurgery;  Laterality: Right;  Bilateral implantable pulse generator placement   SUBTHALAMIC STIMULATOR BATTERY REPLACEMENT N/A 10/19/2018   Procedure: Deep Brain stimulator implantable pulse generator revision;  Surgeon: Maeola Harman, MD;  Location: [facility] OR;  Service: Neurosurgery;  Laterality: N/A;  Deep Brain stimulator implantable pulse generator revision   SUBTHALAMIC STIMULATOR INSERTION Bilateral 02/10/2017   Procedure: Bilateral Deep brain stimulator placement;  Surgeon: Maeola Harman, MD;  Location: [facility] OR;  Service: Neurosurgery;  Laterality: Bilateral;  Bilateral Deep brain stimulator placement   UPPER GASTROINTESTINAL ENDOSCOPY      There were no vitals  filed for this visit.   Subjective Assessment - 09/06/21 1108     Subjective "I'm tired, I did too much this weekend"    Currently in Pain? No/denies                   ADULT SLP TREATMENT - 09/06/21 1109       General Information   Behavior/Cognition Alert;Cooperative;Pleasant mood      Treatment Provided   Treatment provided Cognitive-Linquistic      Cognitive-Linquistic Treatment   Treatment focused on Dysarthria;Patient/family/caregiver education    Skilled Treatment Ian [name] kept tally of daily repeats with a average of  7.8 repeats  . over 5 days. Introduced patient to speecheasyPD website as option to improve intelligiblity. Ian [name] reports success walking and talking on the phone as a way to pace his rate of speech.Reading aloud with metronoome to recalibrate rate of speech with mod I. In simple conversation, Ian [name] required occasional min A to reduce rate wtih tapping. With metronome increased from 70 bpm for reading to 80bpm, in conversation he maintained fluent, intelligible speech. He will practice this on phone conversations with his sister.      Assessment / Recommendations / Plan   Plan Continue with current plan of care      Progression Toward Goals   Progression toward goals Progressing toward goals              SLP Education - 09/06/21 1156     Education Details  speech easy PD, metronome in conversation set faster    Person(s) Educated Patient    Methods Explanation;Demonstration;Verbal cues;Handout    Comprehension Verbalized understanding;Returned demonstration;Verbal cues required              SLP Short Term Goals - 09/06/21 1157       SLP SHORT TERM GOAL #1   Title pt will produce 18/20 sentences with reduced speech rate resulting in 5 or less dysfluencies over 3 sessions    Baseline 9 dysfluenices over 2 minute conversation; 07-26-21; 08/02/21; 08/04/21    Time 5    Period Weeks    Status Achieved      SLP SHORT TERM GOAL #2   Title pt  will demo slower speech producing 90% intelligible speech and less than 5 dysfluencies in 7 minutes simple conversation with occasional min A    Time 5    Period Weeks    Status Achieved      SLP SHORT TERM GOAL #3   Title pt will demo HEP for speech intelligibility with modified independence x3 sessions    Time 3    Period Weeks    Status Achieved              SLP Long Term Goals - 09/06/21 1157       SLP LONG TERM GOAL #1   Title pt will demo slower speech producing >95% intelligible speech inand 5 or less dysfluencies over  15 minutes simple conversation with occasional min A over  x3 sessions    Baseline 08/09/21; 08/11/21; 08/25/21    Time 8    Period Weeks    Status Achieved      SLP LONG TERM GOAL #2   Title pt will report average no more than 7 requests of wife/friends/family asking him to repeat daily    Baseline 7.8 average on 09/06/21, however less than 7 when only friends and family are counted.    Time 6    Period Weeks    Status On-going      SLP LONG TERM GOAL #3   Title Pt will report carrying over compensations for dysfluency/dysarthria to make 3 busisness phone calls    Baseline avoiding/having wife make calls for him; 1 call 07/26/21, another 08/02/21; 08/04/21    Time 11    Period Weeks    Status Achieved      SLP LONG TERM GOAL #4   Title Pt will improve score on Communicative Effectiveness Survey by 2 points    Baseline 21/32    Time 6    Period Weeks    Status On-going              Plan - 09/06/21 1156     Clinical Impression Statement Ian [name] reports some improvement in speech intellgilbity, however times where he can't reduce his rate at all. Targeted use of metronome in conversation with no rushes of speech and 100% intelligiblity. Although metronome is not practical for use outside of the home, Ian [name] agrees to try metronome with his spouse when he can't control rate with phrasing or tapping. He reports success taking a quick sniff during  pauses when he is using phrasing to ensure pause has occurred. He also reports success rehearsing names and information prior to socializing and pre-practicing phrases with slow rate on the way to events to reduce cognitive load and recalibrate rate. Continue skilled ST to maximize intelligiblity for participation in conversations, phone calls and QOL.    Speech  Therapy Frequency 2x / week    Duration 12 weeks    Treatment/Interventions Functional tasks;SLP instruction and feedback;Compensatory strategies;Internal/external aids;Patient/family education;Cueing hierarchy;Environmental controls;Compensatory techniques    Potential to Achieve Goals Good             Patient will benefit from skilled therapeutic intervention in order to improve the following deficits and impairments:   Dysarthria and anarthria    Problem List Patient Active Problem List   Diagnosis Date Noted   Tinea versicolor 05/28/2021   Left rotator cuff tear 08/20/2020   Nonallopathic lesion of sacral region 07/25/2018   Nonallopathic lesion of lumbosacral region 07/25/2018   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 [address] [place], [place], [address] Phone: [phone]   Fax:  [phone]   Name: Ian [name] MRN: [id number] Date of Birth: Oct 07, 1961

## 2021-09-08 ENCOUNTER — Ambulatory Visit: Payer: Medicare Other | Admitting: Speech Pathology

## 2021-09-13 ENCOUNTER — Other Ambulatory Visit: Payer: Self-pay

## 2021-09-13 ENCOUNTER — Encounter: Payer: Self-pay | Admitting: Speech Pathology

## 2021-09-13 ENCOUNTER — Ambulatory Visit: Payer: Medicare Other | Admitting: Speech Pathology

## 2021-09-13 DIAGNOSIS — R471 Dysarthria and anarthria: Secondary | ICD-10-CM | POA: Diagnosis not present

## 2021-09-13 MED ORDER — RYTARY 23.75-95 MG PO CPCR: 2.0000 | ORAL_CAPSULE | Freq: Four times a day (QID) | ORAL | 3 refills | Status: DC

## 2021-09-13 NOTE — Patient Instructions (Signed)
   This week, focus on phrasing with a sniff - practice, practice, practice!!  Use metronome when you are really in a bind if needed  Record your practice sessions with Sheralyn Boatman and see when you are talking too much on 1 breath, ID where you could have taken a  pause/breath

## 2021-09-13 NOTE — Therapy (Signed)
[facility] [address] [place], [place], [address] Phone: [phone]   Fax:  [phone]  Speech Language Pathology Treatment  Patient Details  Name: Ian [name] MRN: [id number] Date of Birth: [birth year] Referring Provider (SLP): Dr. Lurena Joiner [name]   Encounter Date: 09/13/2021   End of Session - 09/13/21 1239     Visit Number 13    Number of Visits 25    Date for SLP Re-Evaluation 10/11/21    SLP Start Time 1105    SLP Stop Time  1145    SLP Time Calculation (min) 40 min    Activity Tolerance Patient tolerated treatment well             Past Medical History:  Diagnosis Date   Anxiety    History of kidney stones    passed   PD (Parkinson's disease) (HCC) 09/10/2013   PONV (postoperative nausea and vomiting)    nausea     Past Surgical History:  Procedure Laterality Date   COLONOSCOPY     MINOR PLACEMENT OF FIDUCIAL N/A 01/31/2017   Procedure: MINOR PLACEMENT OF FIDUCIAL;  Surgeon: Maeola Harman, MD;  Location: [facility] OR;  Service: Neurosurgery;  Laterality: N/A;  Fiducial placement   none     PULSE GENERATOR IMPLANT Right 02/17/2017   Procedure: Right implantable pulse generator placement;  Surgeon: Maeola Harman, MD;  Location: [facility] OR;  Service: Neurosurgery;  Laterality: Right;  Bilateral implantable pulse generator placement   SUBTHALAMIC STIMULATOR BATTERY REPLACEMENT N/A 10/19/2018   Procedure: Deep Brain stimulator implantable pulse generator revision;  Surgeon: Maeola Harman, MD;  Location: [facility] OR;  Service: Neurosurgery;  Laterality: N/A;  Deep Brain stimulator implantable pulse generator revision   SUBTHALAMIC STIMULATOR INSERTION Bilateral 02/10/2017   Procedure: Bilateral Deep brain stimulator placement;  Surgeon: Maeola Harman, MD;  Location: [facility] OR;  Service: Neurosurgery;  Laterality: Bilateral;  Bilateral Deep brain stimulator placement   UPPER GASTROINTESTINAL ENDOSCOPY      There were no vitals  filed for this visit.   Subjective Assessment - 09/13/21 1110     Subjective "I don't think it's for me right now" re: speech easy PD    Currently in Pain? No/denies                   ADULT SLP TREATMENT - 09/13/21 1111       General Information   Behavior/Cognition Alert;Cooperative;Pleasant mood      Treatment Provided   Treatment provided Cognitive-Linquistic      Cognitive-Linquistic Treatment   Treatment focused on Dysarthria;Patient/family/caregiver education    Skilled Treatment Ian [name] has used the metronome successfully when he is having trouble reducing rate  and being intelligible with his wife. Metronome does allow intelligiblity. He has not kept tallies dilligently this week.Ian [name] is recording himself.for feedback with reading at home. Instructed him to record his conversational practice at home with Sheralyn Boatman. Oral reading with metronome to reduce rate with mod I. Geographical information systems officer recorded today with Ian [name]. He required cues for phrasing and breathing during pause, Ian [name] ID's areas of increased speed and 5 dysfluencies. In conversation targeted phrasing, Ian [name] required      Assessment / Recommendations / Plan   Plan Continue with current plan of care      Progression Toward Goals   Progression toward goals Progressing toward goals                SLP Short Term Goals - 09/13/21 1238  SLP SHORT TERM GOAL #1   Title pt will produce 18/20 sentences with reduced speech rate resulting in 5 or less dysfluencies over 3 sessions    Baseline 9 dysfluenices over 2 minute conversation; 07-26-21; 08/02/21; 08/04/21    Time 5    Period Weeks    Status Achieved      SLP SHORT TERM GOAL #2   Title pt will demo slower speech producing 90% intelligible speech and less than 5 dysfluencies in 7 minutes simple conversation with occasional min A    Time 5    Period Weeks    Status Achieved      SLP SHORT TERM GOAL #3   Title pt will demo HEP for speech  intelligibility with modified independence x3 sessions    Time 3    Period Weeks    Status Achieved              SLP Long Term Goals - 09/13/21 1238       SLP LONG TERM GOAL #1   Title pt will demo slower speech producing >95% intelligible speech inand 5 or less dysfluencies over  15 minutes simple conversation with occasional min A over  x3 sessions    Baseline 08/09/21; 08/11/21; 08/25/21    Time 7    Period Weeks    Status Achieved      SLP LONG TERM GOAL #2   Title pt will report average no more than 7 requests of wife/friends/family asking him to repeat daily    Baseline 7.8 average on 09/06/21, however less than 7 when only friends and family are counted.    Time 5    Period Weeks    Status On-going      SLP LONG TERM GOAL #3   Title Pt will report carrying over compensations for dysfluency/dysarthria to make 3 busisness phone calls    Baseline avoiding/having wife make calls for him; 1 call 07/26/21, another 08/02/21; 08/04/21    Time 11    Period Weeks    Status Achieved      SLP LONG TERM GOAL #4   Title Pt will improve score on Communicative Effectiveness Survey by 2 points    Baseline 21/32    Time 5    Period Weeks    Status On-going              Plan - 09/13/21 1238     Clinical Impression Statement Ian [name] reports some improvement in speech intellgilbity, however times where he can't reduce his rate at all. Targeted use of metronome in conversation with no rushes of speech and 100% intelligiblity. Although metronome is not practical for use outside of the home, Ian [name] agrees to try metronome with his spouse when he can't control rate with phrasing or tapping. He reports success taking a quick sniff during pauses when he is using phrasing to ensure pause has occurred. He also reports success rehearsing names and information prior to socializing and pre-practicing phrases with slow rate on the way to events to reduce cognitive load and recalibrate rate. Continue  skilled ST to maximize intelligiblity for participation in conversations, phone calls and QOL.    Speech Therapy Frequency 2x / week    Duration 12 weeks    Treatment/Interventions Functional tasks;SLP instruction and feedback;Compensatory strategies;Internal/external aids;Patient/family education;Cueing hierarchy;Environmental controls;Compensatory techniques    Potential to Achieve Goals Good    Potential Considerations Medical prognosis             Patient will  benefit from skilled therapeutic intervention in order to improve the following deficits and impairments:   Dysarthria and anarthria    Problem List Patient Active Problem List   Diagnosis Date Noted   Tinea versicolor 05/28/2021   Left rotator cuff tear 08/20/2020   Nonallopathic lesion of sacral region 07/25/2018   Nonallopathic lesion of lumbosacral region 07/25/2018   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   Acute bursitis of right shoulder 12/06/2017   Antalgic gait 01/24/2017   Intercostal muscle tear 07/19/2016   PD (Parkinson's disease) (HCC) 06/24/2016   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 [place], [place], [address] Phone: [phone]   Fax:  [phone]   Name: Ian [name] MRN: [id number] Date of Birth: Jul 12, 1961

## 2021-09-15 ENCOUNTER — Ambulatory Visit: Payer: Medicare Other | Admitting: Speech Pathology

## 2021-09-20 ENCOUNTER — Encounter: Payer: Self-pay | Admitting: Speech Pathology

## 2021-09-20 ENCOUNTER — Ambulatory Visit: Payer: Medicare Other | Attending: Family Medicine | Admitting: Speech Pathology

## 2021-09-20 ENCOUNTER — Other Ambulatory Visit: Payer: Self-pay

## 2021-09-20 DIAGNOSIS — R471 Dysarthria and anarthria: Secondary | ICD-10-CM | POA: Diagnosis not present

## 2021-09-20 NOTE — Therapy (Signed)
[facility] [address] [place], [place], [address] Phone: [phone]   Fax:  [phone]  Speech Language Pathology Treatment  Patient Details  Name: Ian [name] MRN: [id number] Date of Birth: 1961-07-10 Referring Provider (SLP): Dr. Lurena Joiner [name]   Encounter Date: 09/20/2021   End of Session - 09/20/21 1446     Visit Number 14    Number of Visits 25    Date for SLP Re-Evaluation 10/11/21    SLP Start Time 1404    SLP Stop Time  1443    SLP Time Calculation (min) 39 min             Past Medical History:  Diagnosis Date   Anxiety    History of kidney stones    passed   PD (Parkinson's disease) (HCC) 09/10/2013   PONV (postoperative nausea and vomiting)    nausea     Past Surgical History:  Procedure Laterality Date   COLONOSCOPY     MINOR PLACEMENT OF FIDUCIAL N/A 01/31/2017   Procedure: MINOR PLACEMENT OF FIDUCIAL;  Surgeon: Maeola Harman, MD;  Location: [facility] OR;  Service: Neurosurgery;  Laterality: N/A;  Fiducial placement   none     PULSE GENERATOR IMPLANT Right 02/17/2017   Procedure: Right implantable pulse generator placement;  Surgeon: Maeola Harman, MD;  Location: [facility] OR;  Service: Neurosurgery;  Laterality: Right;  Bilateral implantable pulse generator placement   SUBTHALAMIC STIMULATOR BATTERY REPLACEMENT N/A 10/19/2018   Procedure: Deep Brain stimulator implantable pulse generator revision;  Surgeon: Maeola Harman, MD;  Location: [facility] OR;  Service: Neurosurgery;  Laterality: N/A;  Deep Brain stimulator implantable pulse generator revision   SUBTHALAMIC STIMULATOR INSERTION Bilateral 02/10/2017   Procedure: Bilateral Deep brain stimulator placement;  Surgeon: Maeola Harman, MD;  Location: [facility] OR;  Service: Neurosurgery;  Laterality: Bilateral;  Bilateral Deep brain stimulator placement   UPPER GASTROINTESTINAL ENDOSCOPY      There were no vitals filed for this visit.   Subjective Assessment - 09/20/21  1410     Subjective "I had some big wins this week with speaking"    Currently in Pain? No/denies                   ADULT SLP TREATMENT - 09/20/21 1410       General Information   Behavior/Cognition Alert;Cooperative;Pleasant mood      Treatment Provided   Treatment provided Cognitive-Linquistic      Cognitive-Linquistic Treatment   Treatment focused on Dysarthria;Patient/family/caregiver education    Skilled Treatment [name] wrote down his order before calling for food, which he states "really helped" He reports success speaking at 3 church events this week. He is reporting improvement at home. Metronome reading to recalibrate rate of speech with mod I. Using metronome [name] maintained fluent, intelligible speech in 5 minute conversation. [name] recorded himself at home with his spouse, and verbaized "I thought I was talking normal and I was too fast." With questioning cues, [name] stated "I guess I have to feel really slow to sound normal" He kept tallies of average of 4-5 requests for repeat on averaged this past week. In recording of 5 minute conversations today, [name] ID'd 4 episodes successful use of phrasing and 1 episode of rapid rate. He affirms that phrasing sounds normal, although it feels un natural      Assessment / Recommendations / Plan   Plan Continue with current plan of care      Progression Toward Goals  Progression toward goals Progressing toward goals                SLP Short Term Goals - 09/20/21 1445       SLP SHORT TERM GOAL #1   Title pt will produce 18/20 sentences with reduced speech rate resulting in 5 or less dysfluencies over 3 sessions    Baseline 9 dysfluenices over 2 minute conversation; 07-26-21; 08/02/21; 08/04/21    Time 5    Period Weeks    Status Achieved      SLP SHORT TERM GOAL #2   Title pt will demo slower speech producing 90% intelligible speech and less than 5 dysfluencies in 7 minutes simple conversation with occasional  min A    Time 5    Period Weeks    Status Achieved      SLP SHORT TERM GOAL #3   Title pt will demo HEP for speech intelligibility with modified independence x3 sessions    Time 3    Period Weeks    Status Achieved              SLP Long Term Goals - 09/20/21 1445       SLP LONG TERM GOAL #1   Title pt will demo slower speech producing >95% intelligible speech inand 5 or less dysfluencies over  15 minutes simple conversation with occasional min A over  x3 sessions    Baseline 08/09/21; 08/11/21; 08/25/21    Time 7    Period Weeks    Status Achieved      SLP LONG TERM GOAL #2   Title pt will report average no more than 7 requests of wife/friends/family asking him to repeat daily    Time 5    Period Weeks    Status Achieved      SLP LONG TERM GOAL #3   Title Pt will report carrying over compensations for dysfluency/dysarthria to make 3 busisness phone calls    Baseline avoiding/having wife make calls for him; 1 call 07/26/21, another 08/02/21; 08/04/21    Time 11    Period Weeks    Status Achieved      SLP LONG TERM GOAL #4   Title Pt will improve score on Communicative Effectiveness Survey by 2 points    Baseline 21/32    Time 4    Period Weeks    Status On-going              Plan - 09/20/21 1442     Clinical Impression Statement Mild dysarthria persists, however, [name] reports improved intelligilbity and carryover of compensations for dysarthria across a variety of settings. He has averaged 4-5 requests for repeat a day (before 6pm) and requires rare to occasional min A to maintain intellgible rate. He carried over pre-writing down what he wants to say on phone calls with success. Continue skilled ST to maximize intellgibility for participation in home, social and business conversations.    Speech Therapy Frequency 2x / week    Duration 12 weeks    Treatment/Interventions Functional tasks;SLP instruction and feedback;Compensatory strategies;Internal/external  aids;Patient/family education;Cueing hierarchy;Environmental controls;Compensatory techniques    Potential to Achieve Goals Good    Potential Considerations Medical prognosis             Patient will benefit from skilled therapeutic intervention in order to improve the following deficits and impairments:   Dysarthria and anarthria    Problem List Patient Active Problem List   Diagnosis Date Noted   Tinea  versicolor 05/28/2021   Left rotator cuff tear 08/20/2020   Nonallopathic lesion of sacral region 07/25/2018   Nonallopathic lesion of lumbosacral region 07/25/2018   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   Acute bursitis of right shoulder 12/06/2017   Antalgic gait 01/24/2017   Intercostal muscle tear 07/19/2016   PD (Parkinson's disease) (HCC) 06/24/2016   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

## 2021-09-21 ENCOUNTER — Other Ambulatory Visit: Payer: Self-pay

## 2021-09-21 MED ORDER — RYTARY 23.75-95 MG PO CPCR: ORAL_CAPSULE | ORAL | 0 refills | Status: DC

## 2021-09-27 ENCOUNTER — Encounter: Payer: Self-pay | Admitting: Speech Pathology

## 2021-09-27 ENCOUNTER — Other Ambulatory Visit: Payer: Self-pay

## 2021-09-27 ENCOUNTER — Ambulatory Visit: Payer: Medicare Other | Admitting: Speech Pathology

## 2021-09-27 DIAGNOSIS — R471 Dysarthria and anarthria: Secondary | ICD-10-CM | POA: Diagnosis not present

## 2021-09-27 NOTE — Therapy (Signed)
[facility] [address] [place], [place], [address] Phone: [phone]   Fax:  [phone]  Speech Language Pathology Treatment  Patient Details  Name: Ian [name] MRN: [id number] Date of Birth: [birth year] Referring Provider (SLP): Dr. Lurena Joiner [name]   Encounter Date: 09/27/2021   End of Session - 09/27/21 1356     Visit Number 14    Number of Visits 25    Date for SLP Re-Evaluation 10/11/21    SLP Start Time 1230    SLP Stop Time  1312    SLP Time Calculation (min) 42 min    Activity Tolerance Patient tolerated treatment well             Past Medical History:  Diagnosis Date   Anxiety    History of kidney stones    passed   PD (Parkinson's disease) (HCC) 09/10/2013   PONV (postoperative nausea and vomiting)    nausea     Past Surgical History:  Procedure Laterality Date   COLONOSCOPY     MINOR PLACEMENT OF FIDUCIAL N/A 01/31/2017   Procedure: MINOR PLACEMENT OF FIDUCIAL;  Surgeon: Maeola Harman, MD;  Location: [facility] OR;  Service: Neurosurgery;  Laterality: N/A;  Fiducial placement   none     PULSE GENERATOR IMPLANT Right 02/17/2017   Procedure: Right implantable pulse generator placement;  Surgeon: Maeola Harman, MD;  Location: [facility] OR;  Service: Neurosurgery;  Laterality: Right;  Bilateral implantable pulse generator placement   SUBTHALAMIC STIMULATOR BATTERY REPLACEMENT N/A 10/19/2018   Procedure: Deep Brain stimulator implantable pulse generator revision;  Surgeon: Maeola Harman, MD;  Location: [facility] OR;  Service: Neurosurgery;  Laterality: N/A;  Deep Brain stimulator implantable pulse generator revision   SUBTHALAMIC STIMULATOR INSERTION Bilateral 02/10/2017   Procedure: Bilateral Deep brain stimulator placement;  Surgeon: Maeola Harman, MD;  Location: [facility] OR;  Service: Neurosurgery;  Laterality: Bilateral;  Bilateral Deep brain stimulator placement   UPPER GASTROINTESTINAL ENDOSCOPY      There were no vitals  filed for this visit.   Subjective Assessment - 09/27/21 1242     Subjective "She said I did better with you when she listened to the recordings"    Currently in Pain? No/denies                   ADULT SLP TREATMENT - 09/27/21 1243       General Information   Behavior/Cognition Alert;Cooperative;Pleasant mood      Treatment Provided   Treatment provided Cognitive-Linquistic      Cognitive-Linquistic Treatment   Treatment focused on Dysarthria;Patient/family/caregiver education    Skilled Treatment [name] enters room with rapid rate, I requested repetition 2x. Employed metronome at 74 bmp to recalibrate rate of speech with reading 2 paragraph passage with rare min visual cues to reduce rate. In conversation with metronome on, [name] maintained intelligible rate over 5 minute conversatoin - review recording of conversation, [name] accurately juged his rate of speech with a grade of B, with 2 episodes of dysfluency, but remained 100% intelligible. In simple conversation targeting phrasing - [name] required usual min A to carryover of phrasing for 5 minutes.      Assessment / Recommendations / Plan   Plan Continue with current plan of care      Progression Toward Goals   Progression toward goals Progressing toward goals              SLP Education - 09/27/21 1311     Education Details continue practicing  phrasing, recording, tallies, metronome    Person(s) Educated Patient    Methods Explanation;Demonstration;Verbal cues;Handout    Comprehension Verbalized understanding;Returned demonstration              SLP Short Term Goals - 09/27/21 1356       SLP SHORT TERM GOAL #1   Title pt will produce 18/20 sentences with reduced speech rate resulting in 5 or less dysfluencies over 3 sessions    Baseline 9 dysfluenices over 2 minute conversation; 07-26-21; 08/02/21; 08/04/21    Time 5    Period Weeks    Status Achieved      SLP SHORT TERM GOAL #2   Title pt will demo  slower speech producing 90% intelligible speech and less than 5 dysfluencies in 7 minutes simple conversation with occasional min A    Time 5    Period Weeks    Status Achieved      SLP SHORT TERM GOAL #3   Title pt will demo HEP for speech intelligibility with modified independence x3 sessions    Time 3    Period Weeks    Status Achieved              SLP Long Term Goals - 09/27/21 1356       SLP LONG TERM GOAL #1   Title pt will demo slower speech producing >95% intelligible speech inand 5 or less dysfluencies over  15 minutes simple conversation with occasional min A over  x3 sessions    Baseline 08/09/21; 08/11/21; 08/25/21    Time 7    Period Weeks    Status Achieved      SLP LONG TERM GOAL #2   Title pt will report average no more than 7 requests of wife/friends/family asking him to repeat daily    Time 5    Period Weeks    Status Achieved      SLP LONG TERM GOAL #3   Title Pt will report carrying over compensations for dysfluency/dysarthria to make 3 busisness phone calls    Baseline avoiding/having wife make calls for him; 1 call 07/26/21, another 08/02/21; 08/04/21    Time 11    Period Weeks    Status Achieved      SLP LONG TERM GOAL #4   Title Pt will improve score on Communicative Effectiveness Survey by 2 points    Baseline 21/32    Time 4    Period Weeks    Status On-going              Plan - 09/27/21 1355     Clinical Impression Statement Mild dysarthria persists, however, [name] reports improved intelligilbity and carryover of compensations for dysarthria across a variety of settings. He has averaged 4-5 requests for repeat a day (before 6pm) and requires rare to occasional min A to maintain intellgible rate. He carried over pre-writing down what he wants to say on phone calls with success. Continue skilled ST to maximize intellgibility for participation in home, social and business conversations. Frequency reduced to 1x a week per pt preference    Speech  Therapy Frequency 1x /week    Duration 12 weeks    Treatment/Interventions Functional tasks;SLP instruction and feedback;Compensatory strategies;Internal/external aids;Patient/family education;Cueing hierarchy;Environmental controls;Compensatory techniques    Potential to Achieve Goals Good    Potential Considerations Medical prognosis             Patient will benefit from skilled therapeutic intervention in order to improve the following deficits and  impairments:   Dysarthria and anarthria    Problem List Patient Active Problem List   Diagnosis Date Noted   Tinea versicolor 05/28/2021   Left rotator cuff tear 08/20/2020   Nonallopathic lesion of sacral region 07/25/2018   Nonallopathic lesion of lumbosacral region 07/25/2018   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   Acute bursitis of right shoulder 12/06/2017   Antalgic gait 01/24/2017   Intercostal muscle tear 07/19/2016   PD (Parkinson's disease) (HCC) 06/24/2016   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

## 2021-09-28 ENCOUNTER — Other Ambulatory Visit: Payer: Self-pay

## 2021-09-28 DIAGNOSIS — G20 Parkinson's disease: Secondary | ICD-10-CM

## 2021-09-28 MED ORDER — RYTARY 23.75-95 MG PO CPCR: 2.0000 | ORAL_CAPSULE | Freq: Four times a day (QID) | ORAL | 1 refills | Status: DC

## 2021-10-04 ENCOUNTER — Encounter: Payer: Self-pay | Admitting: Speech Pathology

## 2021-10-04 ENCOUNTER — Other Ambulatory Visit: Payer: Self-pay

## 2021-10-04 ENCOUNTER — Ambulatory Visit: Payer: Medicare Other | Admitting: Speech Pathology

## 2021-10-04 DIAGNOSIS — R471 Dysarthria and anarthria: Secondary | ICD-10-CM | POA: Diagnosis not present

## 2021-10-04 NOTE — Therapy (Signed)
[facility] [address] [place], [place], [address] Phone: [phone]   Fax:  [phone]  Speech Language Pathology Treatment  Patient Details  Name: Ian [name] MRN: [id number] Date of Birth: 04-09-1961 Referring Provider (SLP): Dr. Lurena Joiner [name]   Encounter Date: 10/04/2021   End of Session - 10/04/21 1011     Visit Number 15    Number of Visits 25    Date for SLP Re-Evaluation 10/11/21    SLP Start Time [phone]   pt arrive late due to traffic   SLP Stop Time  1015    SLP Time Calculation (min) 38 min    Activity Tolerance Patient tolerated treatment well             Past Medical History:  Diagnosis Date   Anxiety    History of kidney stones    passed   PD (Parkinson's disease) (HCC) 09/10/2013   PONV (postoperative nausea and vomiting)    nausea     Past Surgical History:  Procedure Laterality Date   COLONOSCOPY     MINOR PLACEMENT OF FIDUCIAL N/A 01/31/2017   Procedure: MINOR PLACEMENT OF FIDUCIAL;  Surgeon: Maeola Harman, MD;  Location: [facility] OR;  Service: Neurosurgery;  Laterality: N/A;  Fiducial placement   none     PULSE GENERATOR IMPLANT Right 02/17/2017   Procedure: Right implantable pulse generator placement;  Surgeon: Maeola Harman, MD;  Location: [facility] OR;  Service: Neurosurgery;  Laterality: Right;  Bilateral implantable pulse generator placement   SUBTHALAMIC STIMULATOR BATTERY REPLACEMENT N/A 10/19/2018   Procedure: Deep Brain stimulator implantable pulse generator revision;  Surgeon: Maeola Harman, MD;  Location: [facility] OR;  Service: Neurosurgery;  Laterality: N/A;  Deep Brain stimulator implantable pulse generator revision   SUBTHALAMIC STIMULATOR INSERTION Bilateral 02/10/2017   Procedure: Bilateral Deep brain stimulator placement;  Surgeon: Maeola Harman, MD;  Location: [facility] OR;  Service: Neurosurgery;  Laterality: Bilateral;  Bilateral Deep brain stimulator placement   UPPER GASTROINTESTINAL ENDOSCOPY       There were no vitals filed for this visit.   Subjective Assessment - 10/04/21 0941     Subjective "We had to go to a funeral this weekend"    Currently in Pain? No/denies                   ADULT SLP TREATMENT - 10/04/21 0941       General Information   Behavior/Cognition Alert;Cooperative;Pleasant mood      Treatment Provided   Treatment provided Cognitive-Linquistic      Cognitive-Linquistic Treatment   Treatment focused on Dysarthria;Patient/family/caregiver education    Skilled Treatment Ian [name] reports success talking at church and at a funeral this weekend. Ian [name] enters room with WNL rate. He continues to do daily practice and HEP with success. Oral reading to recalibrate rate with metronome. He continues to report 4-5 requests for repeat a day, improvement from 8+ a day. Ian [name] carried over slow rate in conversation with rare min A  over 20 minutes. He filled out Communicative Effectiveness Survey and scored 21, no change in score. I sent a copy home for Ian [name] to fill out. He reports using tapping successfully to reduce rate of speech as needed.      Assessment / Recommendations / Plan   Plan Continue with current plan of care      Progression Toward Goals   Progression toward goals Progressing toward goals  SLP Short Term Goals - 10/04/21 1010       SLP SHORT TERM GOAL #1   Title pt will produce 18/20 sentences with reduced speech rate resulting in 5 or less dysfluencies over 3 sessions    Baseline 9 dysfluenices over 2 minute conversation; 07-26-21; 08/02/21; 08/04/21    Time 5    Period Weeks    Status Achieved      SLP SHORT TERM GOAL #2   Title pt will demo slower speech producing 90% intelligible speech and less than 5 dysfluencies in 7 minutes simple conversation with occasional min A    Time 5    Period Weeks    Status Achieved      SLP SHORT TERM GOAL #3   Title pt will demo HEP for speech intelligibility with modified  independence x3 sessions    Time 3    Period Weeks    Status Achieved              SLP Long Term Goals - 10/04/21 1010       SLP LONG TERM GOAL #1   Title pt will demo slower speech producing >95% intelligible speech inand 5 or less dysfluencies over  15 minutes simple conversation with occasional min A over  x3 sessions    Baseline 08/09/21; 08/11/21; 08/25/21    Time 7    Period Weeks    Status Achieved      SLP LONG TERM GOAL #2   Title pt will report average no more than 7 requests of wife/friends/family asking him to repeat daily    Time 5    Period Weeks    Status Achieved      SLP LONG TERM GOAL #3   Title Pt will report carrying over compensations for dysfluency/dysarthria to make 3 busisness phone calls    Baseline avoiding/having wife make calls for him; 1 call 07/26/21, another 08/02/21; 08/04/21    Time 11    Period Weeks    Status Achieved      SLP LONG TERM GOAL #4   Title Pt will improve score on Communicative Effectiveness Survey by 2 points    Baseline 21/32    Time 4    Period Weeks    Status On-going              Plan - 10/04/21 1008     Clinical Impression Statement Mild dysarthria persists, however, [name] reports improved intelligilbity and carryover of compensations for dysarthria across a variety of settings. He has averaged 4-5 requests for repeat a day (before 6pm) and requires rare to occasional min A to maintain intellgible rate. He carried over pre-writing down what he wants to say on phone calls with success. Continue skilled ST to maximize intellgibility for participation in home, social and business conversations. Frequency reduced to 1x a week per pt preference. Plan to d/c next session (10/22/21). Ian [name] is going out of town for vacation.    Speech Therapy Frequency 1x /week    Duration 12 weeks    Treatment/Interventions Functional tasks;SLP instruction and feedback;Compensatory strategies;Internal/external aids;Patient/family  education;Cueing hierarchy;Environmental controls;Compensatory techniques    Potential to Achieve Goals Good    Potential Considerations Medical prognosis             Patient will benefit from skilled therapeutic intervention in order to improve the following deficits and impairments:   Dysarthria and anarthria    Problem List Patient Active Problem List   Diagnosis Date Noted  Tinea versicolor 05/28/2021   Left rotator cuff tear 08/20/2020   Nonallopathic lesion of sacral region 07/25/2018   Nonallopathic lesion of lumbosacral region 07/25/2018   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   Acute bursitis of right shoulder 12/06/2017   Antalgic gait 01/24/2017   Intercostal muscle tear 07/19/2016   PD (Parkinson's disease) (HCC) 06/24/2016   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 [place], [place], [address] Phone: [phone]   Fax:  [phone]   Name: Jaksen Fiorella MRN: [id number] Date of Birth: 1961-06-10

## 2021-10-08 NOTE — Progress Notes (Signed)
  [name] [address] [place] [address] Phone: [phone] Subjective:   [name], PhD, LAT, ATC acting as a scribe for Ian Files, DO.  I'm seeing this patient by the request  of:  Tracey Harries, MD  CC: L foot pain  TWS:[id number]  Haywood Meinders is a 60 y.o. male coming in with complaint of back and neck pain. OMT 08/30/2021. Patient states L foot injury occurred about a week ago. MOI: Pt "jammed" his L 5th toe on a beach chair. Swelling has improved, but toe is very sore.  Medications patient has been prescribed: None  Taking: IBU     Reviewed prior external information including notes and imaging from previsou exam, outside providers and external EMR if available.   As well as notes that were available from care everywhere and other healthcare systems.  Past medical history, social, surgical and family history all reviewed in electronic medical record.  No pertanent information unless stated regarding to the chief complaint.   Past Medical History:  Diagnosis Date   Anxiety    History of kidney stones    passed   PD (Parkinson's disease) (HCC) 09/10/2013   PONV (postoperative nausea and vomiting)    nausea     No Known Allergies   Review of Systems:  No headache, visual changes, nausea, vomiting, diarrhea, constipation, dizziness, abdominal pain, skin rash, fevers, chills, night sweats, weight loss, swollen lymph nodes, body aches, joint swelling, chest pain, shortness of breath, mood changes. POSITIVE muscle aches  Objective  Blood pressure 104/74, pulse 71, height 6\' 2"  (1.88 m), weight 169 lb 3.2 oz (76.7 kg), SpO2 95 %.   General: No apparent distress alert and oriented x3 mood and affect normal, dressed appropriately.  HEENT: Pupils equal, extraocular movements intact  Respiratory: Patient's speak in full sentences and does not appear short of breath  Cardiovascular: No lower extremity edema, non tender, no  erythema   Gait normal with good balance and coordination.  MSK: Patient still has some mild cogwheeling noted and some mild hypertonicity noted with patient's underlying Parkinson's.  Left foot exam shows the patient does have some swelling noted over the fifth as well as even the fourth metatarsals in the proximal phalanx.  Patient is tender to palpation over the PIP area.  Mild potential angulation noted of the toe.       Assessment and Plan:       The above documentation has been reviewed and is accurate and complete , DO        Note: This dictation was prepared with Dragon dictation along with smaller phrase technology. Any transcriptional errors that result from this process are unintentional.

## 2021-10-12 ENCOUNTER — Ambulatory Visit: Payer: Medicare Other | Admitting: Family Medicine

## 2021-10-18 ENCOUNTER — Encounter: Payer: Self-pay | Admitting: Family Medicine

## 2021-10-19 ENCOUNTER — Encounter: Payer: Self-pay | Admitting: Family Medicine

## 2021-10-19 ENCOUNTER — Other Ambulatory Visit: Payer: Self-pay

## 2021-10-19 ENCOUNTER — Ambulatory Visit (INDEPENDENT_AMBULATORY_CARE_PROVIDER_SITE_OTHER): Payer: Medicare Other | Admitting: Family Medicine

## 2021-10-19 ENCOUNTER — Ambulatory Visit (INDEPENDENT_AMBULATORY_CARE_PROVIDER_SITE_OTHER): Payer: Medicare Other

## 2021-10-19 VITALS — BP 104/74 | HR 71 | Ht 74.0 in | Wt 169.2 lb

## 2021-10-19 DIAGNOSIS — M79672 Pain in left foot: Secondary | ICD-10-CM

## 2021-10-19 DIAGNOSIS — S92502A Displaced unspecified fracture of left lesser toe(s), initial encounter for closed fracture: Secondary | ICD-10-CM

## 2021-10-19 NOTE — Assessment & Plan Note (Signed)
Do believe the patient does have 1/5 toe fracture.  X-rays are pending.  But quick ultrasound did show the patient did have what appeared to be a cortical defect of the PIP.  Discussed with patient about a rigid soled shoe.  Discussed we could potentially consider a postop shoe versus a cam walker but patient has elected to try to eat regular shoes with a potential carbon fiber plate.  Patient likely will do well but will take 4 to 8 weeks to heal fully.  Follow-up with me again in 4 to 6 weeks otherwise.

## 2021-10-19 NOTE — Patient Instructions (Addendum)
Good to see you  Please get an Xray today before you leave   Rigid soled shoes w/ carbon plating  [place] recovery sandals in the house  Continue the Vitamin D  Worsen pain call the office  Will likely take 2 months to fully heal  See me again in 6-8 weeks for OMT

## 2021-10-20 ENCOUNTER — Telehealth: Payer: Self-pay | Admitting: Family Medicine

## 2021-10-20 NOTE — Telephone Encounter (Signed)
[facility]iology called in regards to the left foot xray.  FINDINGS: There is an acute oblique fracture through the proximal aspect of the fifth proximal phalanx. This appears nondisplaced. There is surrounding soft tissue swelling. Joint spaces are maintained. There is no dislocation.   IMPRESSION: 1. Acute fracture fifth proximal phalanx.

## 2021-10-22 ENCOUNTER — Ambulatory Visit: Payer: Medicare Other | Admitting: Speech Pathology

## 2021-10-28 ENCOUNTER — Other Ambulatory Visit: Payer: Self-pay

## 2021-10-28 ENCOUNTER — Ambulatory Visit: Payer: Medicare Other | Attending: Family Medicine | Admitting: Speech Pathology

## 2021-10-28 DIAGNOSIS — R471 Dysarthria and anarthria: Secondary | ICD-10-CM | POA: Diagnosis present

## 2021-10-28 NOTE — Therapy (Signed)
[facility] [address] [place], [place], [address] Phone: [phone]   Fax:  [phone]  Speech Language Pathology Treatment & Discharge Summary  Patient Details  Name: Ian [name] MRN: [id number] Date of Birth: May 17, 1961 Referring Provider (SLP): Dr. Wells Guiles [name]   Encounter Date: 10/28/2021   End of Session - 10/28/21 1200     Visit Number 16    Number of Visits 25    Date for SLP Re-Evaluation 11/04/21    SLP Start Time 1110   pt arrived 10 minutes late   SLP Stop Time  1145    SLP Time Calculation (min) 35 min    Activity Tolerance Patient tolerated treatment well             Past Medical History:  Diagnosis Date   Anxiety    History of kidney stones    passed   PD (Parkinson's disease) ([facility]) 09/10/2013   PONV (postoperative nausea and vomiting)    nausea     Past Surgical History:  Procedure Laterality Date   COLONOSCOPY     MINOR PLACEMENT OF FIDUCIAL N/A 01/31/2017   Procedure: MINOR PLACEMENT OF FIDUCIAL;  Surgeon: Erline Levine, MD;  Location: [facility];  Service: Neurosurgery;  Laterality: N/A;  Fiducial placement   none     PULSE GENERATOR IMPLANT Right 02/17/2017   Procedure: Right implantable pulse generator placement;  Surgeon: Erline Levine, MD;  Location: [facility];  Service: Neurosurgery;  Laterality: Right;  Bilateral implantable pulse generator placement   SUBTHALAMIC STIMULATOR BATTERY REPLACEMENT N/A 10/19/2018   Procedure: Deep Brain stimulator implantable pulse generator revision;  Surgeon: Erline Levine, MD;  Location: [facility];  Service: Neurosurgery;  Laterality: N/A;  Deep Brain stimulator implantable pulse generator revision   SUBTHALAMIC STIMULATOR INSERTION Bilateral 02/10/2017   Procedure: Bilateral Deep brain stimulator placement;  Surgeon: Erline Levine, MD;  Location: [facility];  Service: Neurosurgery;  Laterality: Bilateral;  Bilateral Deep brain stimulator placement   UPPER  GASTROINTESTINAL ENDOSCOPY      There were no vitals filed for this visit.   Subjective Assessment - 10/28/21 1116     Subjective "Our vacation was wonderful" - pt returns after vacation    Currently in Pain? No/denies                   ADULT SLP TREATMENT - 10/28/21 1113       General Information   Behavior/Cognition Alert;Cooperative;Pleasant mood      Treatment Provided   Treatment provided Cognitive-Linquistic      Cognitive-Linquistic Treatment   Treatment focused on Dysarthria;Patient/family/caregiver education    Skilled Treatment Ian [name] returns after a vacation. He reports "good days and bad days"  He has not completed HEP regularly. Recalibrated rate of speech with reading.article using metronome.Today, Ian [name] Id'd his rushes of speech and was able to slow down and correct his utterances with mod I. 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 He has not recorded conversations with spouse, I encouraged him to continue this for feedback on his rate of speech.      Assessment / Recommendations / Plan   Plan Discharge SLP treatment due to (comment)      Progression Toward Goals   Progression toward goals Goals met, education completed, patient discharged from SLP              SLP  Education - 10/28/21 1154     Education Details continue home practice and recording your practice conversations with Ian [name]) Educated Patient    Methods Explanation;Demonstration;Verbal cues;Handout    Comprehension Verbalized understanding;Returned demonstration            SPEECH THERAPY DISCHARGE SUMMARY  Visits from Start of Care: 16  Current functional level related to goals / functional outcomes: See goals below   Remaining deficits: Dysarthria   Education / Equipment: Compensatory strategies and HEP for dysarthria   Patient agrees to discharge.  Patient goals were partially met. Patient is being discharged due to meeting the stated rehab goals.[name]     SLP Short Term Goals - 10/28/21 1159       SLP SHORT TERM GOAL #1   Title pt will produce 18/20 sentences with reduced speech rate resulting in 5 or less dysfluencies over 3 sessions    Baseline 9 dysfluenices over 2 minute conversation; 07-26-21; 08/02/21; 08/04/21    Time 5    Period Weeks    Status Achieved      SLP SHORT TERM GOAL #2   Title pt will demo slower speech producing 90% intelligible speech and less than 5 dysfluencies in 7 minutes simple conversation with occasional min A    Time 5    Period Weeks    Status Achieved      SLP SHORT TERM GOAL #3   Title pt will demo HEP for speech intelligibility with modified independence x3 sessions    Time 3    Period Weeks    Status Achieved              SLP Long Term Goals - 10/28/21 1159       SLP LONG TERM GOAL #1   Title pt will demo slower speech producing >95% intelligible speech inand 5 or less dysfluencies over  15 minutes simple conversation with occasional min A over  x3 sessions    Baseline 08/09/21; 08/11/21; 08/25/21    Time 7    Period Weeks    Status Achieved      SLP LONG TERM GOAL #2   Title pt will report average no more than 7 requests of wife/friends/family asking him to repeat daily    Time 5    Period Weeks    Status Achieved      SLP LONG TERM GOAL #3   Title Pt will report carrying over compensations for dysfluency/dysarthria to make 3 busisness phone calls    Baseline avoiding/having wife make calls for him; 1 call 07/26/21, another 08/02/21; 08/04/21    Time 11    Period Weeks    Status Achieved      SLP LONG TERM GOAL #4   Title Pt will improve score on Communicative Effectiveness Survey by 2 points    Baseline 21/32    Time 4    Period Weeks Not Achieved             Plan - 10/28/21 1155     Clinical Impression Statement Mild dysarthria persists, Ian [name] returns today after last  visit 10/04/21 due to vacation and schedule conflicts. Due to break in therapy, he has passed date for cert.  I will recert for this last visit. Ian [name] has met ST goals, he is using phrasing, pausing and tapping as needed to reduce rate of speech. He conitnues to find this difficult when upset or stressed. He reports success communicating with his wife, kids, siblings and  in the community. Ian [name] has also had to plan phone calls or speaking in community around his medication schedule to help maximize intelligibility. D/C ST at this time. Ian [name] has scheduled 6 month PD screens with PT, OT, ST May 11.    Speech Therapy Frequency 1x /week    Duration 12 weeks    Treatment/Interventions Functional tasks;SLP instruction and feedback;Compensatory strategies;Internal/external aids;Patient/family education;Cueing hierarchy;Environmental controls;Compensatory techniques             Patient will benefit from skilled therapeutic intervention in order to improve the following deficits and impairments:   Dysarthria and anarthria    Problem List Patient Active Problem List   Diagnosis Date Noted   Closed fracture of phalanx of left fifth toe 10/19/2021   Tinea versicolor 05/28/2021   Left rotator cuff tear 08/20/2020   Nonallopathic lesion of sacral region 07/25/2018   Nonallopathic lesion of lumbosacral region 07/25/2018   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   Acute bursitis of right shoulder 12/06/2017   Antalgic gait 01/24/2017   Intercostal muscle tear 07/19/2016   PD (Parkinson's disease) ([facility]) 06/24/2016   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) 10/18/2013    Avriana Joo, Annye Rusk MS, CCC-SLP 10/28/2021,  12:01 PM  [facility] [address] [place], [place], [address] Phone: [phone]   Fax:  [phone]   Name: [facility] MRN: [id number] Date of Birth: [birth year]

## 2021-10-28 NOTE — Patient Instructions (Addendum)
   Screen in 6 months here. At that time, if you need therapy, you can request [facility] for treatment  If you need to come back to therapy before the screen, just call [name] and get an order  If you want to see Baldo Ash before the wedding for a few weeks that would be OK  Speak Easy PD  Continue to practice with metronome to slow you down and recalibrate your rate of speech  Phrasing, pausing, tapping, metronome can all help - use your tools  Keep writing down things you want to say on the phone  While driving, recite slowly things you have memorized (pledge, nursery rhymes, prayers) to slow down your speech  You need to go so slower than you think to get a normal rate of speech  Write your FOB speech, put in marks where you are going to pause, record yourself as practice

## 2021-10-31 NOTE — Progress Notes (Signed)
Assessment/Plan:   1.  Parkinsons Disease  -The patient underwent bilateral STN DBS with [employer] device on 02/10/2017 and had IPG placement on 02/17/2017.    He did have his battery changed on October 19, 2018 to an MRI compatible battery.             -Continue [name], 95 mg, 2 tablets 4 times per day  -Patient would like to continue amantadine, 100 mg 3 times per day.  He thinks it is helping his dyskinesia right now.  2.  Fatigue              -Ultimately, this is likely related to the disease, but I do not think that there are any medications that are going to help.  We have tried Provigil, Nuvigil and selegiline, all without relief.  He is exercising.  3.  REM behavior disorder.             -On clonazepam 0.5 mg, half tablet at night.  PDMP has been reviewed.  No red flags.   Subjective:   Ian [name] was seen today in follow up for Parkinsons disease.  My previous records were reviewed prior to todays visit as well as outside records available to me. Pt denies falls.  Pt denies lightheadedness, near syncope.  No hallucinations.  Mood has been good. Started on opicapone and it did help some of the off time.  He still has the fatigue, which has been a chronic issue, but that may be a little better than it was.  We trialed amantadine for dyskinesia.  He took it x 3 weeks.  He thought that it caused some loss of appetite, dizziness, sadness and d/c it.  He states that he ended up going back on it as after he tried the opicapone and he had too much dyskinisia.  He did not have the same side effects and thought that the amantadine was helping.  He ended up staying on it.  He is off of the opicapone.  His biggest issue right now, short of the fatigue, is speech change.  He does do speech therapy.  Current prescribed movement disorder medications: [name] 95 mg, 2 tablets 4 times per day  Clonazepam 0.5 mg, half tablet at night Opicapone, 50 mg (started last visit)  Amantadine 100  mg tid (doing good with it)   PREVIOUS MEDICATIONS:  Requip; Neupro (tried only a few days); propranolol helped tremor some; Inbrija (samples given but not taken); Provigil; Nuvigil; selegiline (no help for fatigue); opicapone (increased dyskinesia); amantadine   ALLERGIES:  No Known Allergies  CURRENT MEDICATIONS:  Outpatient Encounter Medications as of 11/02/2021  Medication Sig   amantadine (SYMMETREL) 100 MG capsule Take 1 capsule (100 mg total) by mouth in the morning, at noon, and at bedtime.   Ascorbic Acid (VITAMIN C PO) Take 3 tablets by mouth 3 (three) times daily.    b complex vitamins tablet Take 1 tablet by mouth 2 (two) times daily.   Carbidopa-Levodopa ER ([name]) 23.75-95 MG CPCR Take 2 capsules by mouth 4 (four) times daily.   clonazePAM (KLONOPIN) 0.5 MG tablet TAKE 1/2 TABLET(0.25 MG) BY MOUTH AT BEDTIME   ketoconazole (NIZORAL) 2 % cream Apply 1 application topically daily. To affected areas.   Melatonin 3 MG CAPS Take 3 mg by mouth at bedtime.   terbinafine (LAMISIL) 1 % cream Apply 1 application topically 2 (two) times daily.   [DISCONTINUED] Opicapone 50 MG CAPS Take 1 capsule by mouth  at bedtime. (Patient not taking: Reported on 11/02/2021)   No facility-administered encounter medications on file as of 11/02/2021.    Objective:   PHYSICAL EXAMINATION:    VITALS:   Vitals:   11/02/21 0950  BP: 123/82  Pulse: 85  SpO2: 95%  Weight: 171 lb 3.2 oz (77.7 kg)  Height: 6\' 2"  (1.88 m)     GEN:  The patient appears stated age and is in NAD. HEENT:  Normocephalic, atraumatic.    Neurological examination:  Orientation: The patient is alert and oriented x3. Cranial nerves: There is good facial symmetry with minimal facial hypomimia.  Extraocular muscles are intact.  The speech is fluent and mildly dysarthric.  He is just slightly hypophonic.  Soft palate rises symmetrically and there is no tongue deviation. Hearing is intact to conversational tone. Sensation:  Sensation is intact to light touch throughout Motor: Strength is 5/5 in the bilateral upper extremities.  No pronator drift.  Movement examination: Tone: There is normal tone on the left.  He has mild increased tone in the right upper extremity Abnormal movements: There is mild intermittent tremor of the right leg. Coordination:  There is no decremation with RAM's Gait and Station: The patient is able to ambulate well.  He has good stride length.  He has decreased arm swing on the right.  I have reviewed and interpreted the following labs independently    Chemistry      Component Value Date/Time   NA 141 10/15/2018 1533   K 4.4 10/15/2018 1533   CL 105 10/15/2018 1533   CO2 28 10/15/2018 1533   BUN 16 10/15/2018 1533   CREATININE 1.05 10/15/2018 1533   CREATININE 0.91 11/25/2014 1051      Component Value Date/Time   CALCIUM 9.7 10/15/2018 1533   ALKPHOS 69 06/10/2015 1155   AST 27 06/10/2015 1155   ALT 40 06/10/2015 1155   BILITOT 1.0 06/10/2015 1155       Lab Results  Component Value Date   WBC 6.5 10/15/2018   HGB 15.4 10/15/2018   HCT 48.2 10/15/2018   MCV 91.6 10/15/2018   PLT 302 10/15/2018    No results found for: TSH   Total time spent on today's visit was 30 minutes, including both face-to-face time and nonface-to-face time.  Time included that spent on review of records (prior notes available to me/labs/imaging if pertinent), discussing treatment and goals, answering patient's questions and coordinating care.  This did not include DBS time, which is on a separate programming procedural note.  Cc:  10/17/2018, MD

## 2021-11-02 ENCOUNTER — Other Ambulatory Visit: Payer: Self-pay

## 2021-11-02 ENCOUNTER — Encounter: Payer: Self-pay | Admitting: Neurology

## 2021-11-02 ENCOUNTER — Ambulatory Visit (INDEPENDENT_AMBULATORY_CARE_PROVIDER_SITE_OTHER): Payer: Medicare Other | Admitting: Neurology

## 2021-11-02 VITALS — BP 123/82 | HR 85 | Ht 74.0 in | Wt 171.2 lb

## 2021-11-02 DIAGNOSIS — G20 Parkinson's disease: Secondary | ICD-10-CM

## 2021-11-02 DIAGNOSIS — G249 Dystonia, unspecified: Secondary | ICD-10-CM | POA: Diagnosis not present

## 2021-11-02 NOTE — Procedures (Signed)
DBS Programming was performed.    Manufacturer of DBS device: [employer]  Total time spent programming was 12 minutes.  Device was confirmed to be on.  Soft start was confirmed to be on.  Impedences were checked and were within normal limits.  Battery was checked and was determined to be functioning normally and not near the end of life.  Final settings were as follows:    Active Contact Amplitude (mA) PW (ms) Frequency (hz) Side Effects  Left Brain       11/23/20 3-(90%)4-(10%)C+ 3.8 (2.8-4.0) 60 130   04/26/21 3-(90%)4-(10%)C+ 3.8 60 130   11/02/21 3-(90%)4-(10%)C+ 3.8 60 130          Right Brain       11/23/20 2-(30%)3-(70%)C+ 3.2 60 130 Tried PW of 70 but not tolerated (?dizzy)  04/26/21 2-(30%)3-(70%)C+ 3.2 60 130   11/02/21 2-(30%)3-(70%)C+ 3.1 60 130

## 2021-11-30 ENCOUNTER — Encounter: Payer: Self-pay | Admitting: Neurology

## 2021-12-03 ENCOUNTER — Telehealth: Payer: Self-pay

## 2021-12-03 NOTE — Telephone Encounter (Signed)
Pt called an advised that Dr [name] will not be able to do the paperwork that he dropped off all that she would be able to say is what was in the 2016 letter. Pt verbalized understanding and stated that he is going to come by and pick up his paperwork

## 2021-12-03 NOTE — Progress Notes (Signed)
[name] [address] [place] [address] Phone: [phone] Subjective:   [name], am serving as a scribe for Dr. Antoine Primas. This visit occurred during the SARS-CoV-2 public health emergency.  Safety protocols were in place, including screening questions prior to the visit, additional usage of staff PPE, and extensive cleaning of exam room while observing appropriate contact time as indicated for disinfecting solutions.   I'm seeing this patient by the request  of:  Tracey Harries, MD  CC: back and neck pain   [id number]  Ian [name] is a 60 y.o. male coming in with complaint of back and neck pain. Last seen on 10/19/2021 for L foot pain. Patient states same tension same locations. No new complaints.  Medications patient has been prescribed:   Taking:  Xray 10/19/2021 L foot IMPRESSION: 1. Acute fracture fifth proximal phalanx.       Reviewed prior external information including notes and imaging from previsou exam, outside providers and external EMR if available.   As well as notes that were available from care everywhere and other healthcare systems.  Past medical history, social, surgical and family history all reviewed in electronic medical record.  No pertanent information unless stated regarding to the chief complaint.   Past Medical History:  Diagnosis Date   Anxiety    History of kidney stones    passed   PD (Parkinson's disease) ([facility]) 09/10/2013   PONV (postoperative nausea and vomiting)    nausea     No Known Allergies   Review of Systems:  No headache, visual changes, nausea, vomiting, diarrhea, constipation, dizziness, abdominal pain, skin rash, fevers, chills, night sweats, weight loss, swollen lymph nodes, body aches, joint swelling, chest pain, shortness of breath, mood changes. POSITIVE muscle aches  Objective  Blood pressure 90/60, pulse 78, height 6\' 2"  (1.88 m), weight 171 lb (77.6 kg), SpO2 98  %.   General: No apparent distress alert and oriented x3 mood and affect normal, dressed appropriately.  HEENT: Pupils equal, extraocular movements intact  Respiratory: Patient's speak in full sentences and does not appear short of breath  Cardiovascular: No lower extremity edema, non tender, no erythema  Patient has not neck exam still some tightness.  Nothing shows worsening symptoms.  Very mild subtle tremor noted.  Mild hypertonicity of muscles of muscles noted.  Osteopathic findings  C2 flexed rotated and side bent right C7 flexed rotated and side bent left T3 extended rotated and side bent right inhaled rib T8 extended rotated and side bent left L2 flexed rotated and side bent right Sacrum right on right       Assessment and Plan:  Slipped rib syndrome Chronic problem.  Likely still secondary to more of the argument that causes tightness.  Discussed icing regimen and home exercises, which activities to doing which wants avoid.  Increase activity slowly.  Follow-up again in 6 to 8 weeks.   Nonallopathic problems  Decision today to treat with OMT was based on Physical Exam  After verbal consent patient was treated with HVLA, ME, FPR techniques in cervical, rib, thoracic, lumbar, and sacral  areas  Patient tolerated the procedure well with improvement in symptoms  Patient given exercises, stretches and lifestyle modifications  See medications in patient instructions if given  Patient will follow up in 4-8 weeks      The above documentation has been reviewed and is accurate and complete , DO  Note: This dictation was prepared with Dragon dictation along with smaller phrase technology. Any transcriptional errors that result from this process are unintentional.

## 2021-12-06 ENCOUNTER — Ambulatory Visit (INDEPENDENT_AMBULATORY_CARE_PROVIDER_SITE_OTHER): Payer: Medicare Other | Admitting: Family Medicine

## 2021-12-06 ENCOUNTER — Other Ambulatory Visit: Payer: Self-pay

## 2021-12-06 VITALS — BP 90/60 | HR 78 | Ht 74.0 in | Wt 171.0 lb

## 2021-12-06 DIAGNOSIS — M9902 Segmental and somatic dysfunction of thoracic region: Secondary | ICD-10-CM | POA: Diagnosis not present

## 2021-12-06 DIAGNOSIS — M9903 Segmental and somatic dysfunction of lumbar region: Secondary | ICD-10-CM | POA: Diagnosis not present

## 2021-12-06 DIAGNOSIS — M940 Chondrocostal junction syndrome [Tietze]: Secondary | ICD-10-CM

## 2021-12-06 DIAGNOSIS — M9901 Segmental and somatic dysfunction of cervical region: Secondary | ICD-10-CM

## 2021-12-06 DIAGNOSIS — M9904 Segmental and somatic dysfunction of sacral region: Secondary | ICD-10-CM | POA: Diagnosis not present

## 2021-12-06 DIAGNOSIS — M9908 Segmental and somatic dysfunction of rib cage: Secondary | ICD-10-CM

## 2021-12-06 NOTE — Patient Instructions (Signed)
Always a pleasure Happy Holidays See you again in 6 weeks

## 2021-12-06 NOTE — Assessment & Plan Note (Signed)
Chronic problem.  Likely still secondary to more of the argument that causes tightness.  Discussed icing regimen and home exercises, which activities to doing which wants avoid.  Increase activity slowly.  Follow-up again in 6 to 8 weeks.

## 2022-01-05 DIAGNOSIS — Z0279 Encounter for issue of other medical certificate: Secondary | ICD-10-CM

## 2022-01-14 NOTE — Progress Notes (Signed)
Ian [name] [address] [place] Phone: [phone] Subjective:   IVilma Meckel, am serving as a scribe for Dr. Hulan Saas. This visit occurred during the SARS-CoV-2 public health emergency.  Safety protocols were in place, including screening questions prior to the visit, additional usage of staff PPE, and extensive cleaning of exam room while observing appropriate contact time as indicated for disinfecting solutions.   I'm seeing this patient by the request  of:  Bernerd Limbo, MD  CC: Neck and back pain follow-up  [id number]  [name] is a 61 y.o. male coming in with complaint of back and neck pain. OMT 12/06/2021. Patient states same as usual. No new complaints.  Medications patient has been prescribed: None 61        Reviewed prior external information including notes and imaging from previsou exam, outside providers and external EMR if available.   As well as notes that were available from care everywhere and other healthcare systems.  Past medical history, social, surgical and family history all reviewed in electronic medical record.  No pertanent information unless stated regarding to the chief complaint.   Past Medical History:  Diagnosis Date   Anxiety    History of kidney stones    passed   PD (Parkinson's disease) ([place]) 09/10/2013   PONV (postoperative nausea and vomiting)    nausea     No Known Allergies   Review of Systems:  No headache, visual changes, nausea, vomiting, diarrhea, constipation, dizziness, abdominal pain, skin rash, fevers, chills, night sweats, weight loss, swollen lymph nodes, body aches, joint swelling, chest pain, shortness of breath, mood changes. POSITIVE muscle aches  Objective  Blood pressure 114/74, pulse 82, height 6\' 2"  (1.88 m), weight 168 lb (76.2 kg), SpO2 97 %.   General: No apparent distress alert and oriented x3 mood and affect normal, dressed appropriately.   HEENT: Pupils equal, extraocular movements intact  Respiratory: Patient's speak in full sentences and does not appear short of breath  Cardiovascular: No lower extremity edema, non tender, no erythema  Patient does have some mild tightness noted in the paraspinal musculature.  He continues to have some hypertonicity noted.  Patient does have some limited sidebending of the neck.  Osteopathic findings  C2 flexed rotated and side bent right C6 flexed rotated and side bent left T3 extended rotated and side bent right inhaled rib T6 Beese extended rotated and side bent left L5 flexed rotated and side bent left Sacrum right on right       Assessment and Plan:  Slipped rib syndrome Chronic problem with mild exacerbation.  We discussed with patient icing regimen and home exercises.  Likely does not need any pain medication at this point.  Increase activity slowly.  Follow-up with me again in 6 to 8 weeks.   Nonallopathic problems  Decision today to treat with OMT was based on Physical Exam  After verbal consent patient was treated with HVLA, ME, FPR techniques in cervical, rib, thoracic, lumbar, and sacral  areas  Patient tolerated the procedure well with improvement in symptoms  Patient given exercises, stretches and lifestyle modifications  See medications in patient instructions if given  Patient will follow up in 4-8 weeks     The above documentation has been reviewed and is accurate and complete Lyndal Pulley, DO        Note: This dictation was prepared with Dragon dictation along with smaller phrase technology. Any transcriptional errors that  result from this process are unintentional.

## 2022-01-17 ENCOUNTER — Ambulatory Visit (INDEPENDENT_AMBULATORY_CARE_PROVIDER_SITE_OTHER): Payer: Medicare Other | Admitting: Family Medicine

## 2022-01-17 ENCOUNTER — Other Ambulatory Visit: Payer: Self-pay

## 2022-01-17 VITALS — BP 114/74 | HR 82 | Ht 74.0 in | Wt 168.0 lb

## 2022-01-17 DIAGNOSIS — M9902 Segmental and somatic dysfunction of thoracic region: Secondary | ICD-10-CM

## 2022-01-17 DIAGNOSIS — M9904 Segmental and somatic dysfunction of sacral region: Secondary | ICD-10-CM | POA: Diagnosis not present

## 2022-01-17 DIAGNOSIS — M940 Chondrocostal junction syndrome [Tietze]: Secondary | ICD-10-CM

## 2022-01-17 DIAGNOSIS — M9908 Segmental and somatic dysfunction of rib cage: Secondary | ICD-10-CM

## 2022-01-17 DIAGNOSIS — M9901 Segmental and somatic dysfunction of cervical region: Secondary | ICD-10-CM | POA: Diagnosis not present

## 2022-01-17 DIAGNOSIS — M9903 Segmental and somatic dysfunction of lumbar region: Secondary | ICD-10-CM

## 2022-01-17 NOTE — Assessment & Plan Note (Signed)
Chronic problem with mild exacerbation.  We discussed with patient icing regimen and home exercises.  Likely does not need any pain medication at this point.  Increase activity slowly.  Follow-up with me again in 6 to 8 weeks.

## 2022-01-17 NOTE — Patient Instructions (Signed)
See me in 6 weeks Sorry for your loss

## 2022-01-27 ENCOUNTER — Other Ambulatory Visit: Payer: Self-pay

## 2022-01-27 DIAGNOSIS — G20 Parkinson's disease: Secondary | ICD-10-CM

## 2022-01-27 MED ORDER — AMANTADINE HCL 100 MG PO CAPS
100.0000 mg | ORAL_CAPSULE | Freq: Three times a day (TID) | ORAL | 1 refills | Status: DC
Start: 2022-01-27 — End: 2022-08-01

## 2022-02-07 ENCOUNTER — Encounter: Payer: Self-pay | Admitting: Neurology

## 2022-02-08 MED ORDER — CLONAZEPAM 0.5 MG PO TABS: ORAL_TABLET | ORAL | 1 refills | Status: DC

## 2022-02-25 NOTE — Progress Notes (Signed)
?  [name] D.O. ?[facility] ?[address] [place] [address] ?Phone: [phone] ?Subjective:   ?I, Ian [name], am serving as a Neurosurgeon for Dr. Antoine Primas. ?This visit occurred during the SARS-CoV-2 public health emergency.  Safety protocols were in place, including screening questions prior to the visit, additional usage of staff PPE, and extensive cleaning of exam room while observing appropriate contact time as indicated for disinfecting solutions.  ? ?I'm seeing this patient by the request  of:  Tracey Harries, MD ? ?CC: Back and neck pain follow-up ? ?[id number]  ?Ian [name] is a 61 y.o. male coming in with complaint of back and neck pain. OMT 01/17/2022. Patient states doing well. Same per usual. No new complaints. ? ?Medications patient has been prescribed: None ? ? ?  ? ? ?Past Medical History:  ?Diagnosis Date  ? Anxiety   ? History of kidney stones   ? passed  ? PD (Parkinson's disease) ([facility]CC) 09/10/2013  ? PONV (postoperative nausea and vomiting)   ? nausea   ?  ?No Known Allergies ? ? ?Review of Systems: ? No headache, visual changes, nausea, vomiting, diarrhea, constipation, dizziness, abdominal pain, skin rash, fevers, chills, night sweats, weight loss, swollen lymph nodes, body aches, joint swelling, chest pain, shortness of breath, mood changes. POSITIVE muscle aches ? ?Objective  ?Blood pressure 112/74, pulse 87, height 6\' 2"  (1.88 m), weight 169 lb (76.7 kg), SpO2 98 %. ?  ?General: No apparent distress alert and oriented x3 mood and affect normal, dressed appropriately.  ?HEENT: Pupils equal, extraocular movements intact  ?Respiratory: Patient's speak in full sentences and does not appear short of breath  ?Cardiovascular: No lower extremity edema, non tender, no erythema  ?Mild tremor, continue tightness noted  ?Mild tightness with FABER test.  Lacks last 5 degrees of extension and flexion of the back. ? ?Osteopathic findings ? ?C2 flexed rotated and side bent  right ?C6 flexed rotated and side bent left ?T3 extended rotated and side bent right inhaled rib ?T8 extended rotated and side bent left ?L2 flexed rotated and side bent right ?Sacrum right on right ? ? ?  ?Assessment and Plan: ? 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  Patient is still being able to work out on a regular basis.  Discussed icing regimen and home exercises, which activities to do which ones to avoid, increase activity slowly.  Follow-up again in 6 to 8 weeks. ?  ? ?Nonallopathic problems ? ?Decision today to treat with OMT was based on Physical Exam ? ?After verbal consent patient was treated with HVLA, ME, FPR techniques in cervical, rib, thoracic, lumbar, and sacral  areas ? ?Patient tolerated the procedure well with improvement in symptoms ? ?Patient given exercises, stretches and lifestyle modifications ? ?See medications in patient instructions if given ? ?Patient will follow up in 4-8 weeks ? ?  ? ? ?The above documentation has been reviewed and is accurate and complete , DO ? ? ? ?  ? ? Note: This dictation was prepared with Dragon dictation along with smaller phrase technology. Any transcriptional errors that result from this process are unintentional.    ?  ?  ? ?

## 2022-02-28 ENCOUNTER — Other Ambulatory Visit: Payer: Self-pay

## 2022-02-28 ENCOUNTER — Ambulatory Visit (INDEPENDENT_AMBULATORY_CARE_PROVIDER_SITE_OTHER): Payer: Medicare Other | Admitting: Family Medicine

## 2022-02-28 VITALS — BP 112/74 | HR 87 | Ht 74.0 in | Wt 169.0 lb

## 2022-02-28 DIAGNOSIS — M9904 Segmental and somatic dysfunction of sacral region: Secondary | ICD-10-CM | POA: Diagnosis not present

## 2022-02-28 DIAGNOSIS — M940 Chondrocostal junction syndrome [Tietze]: Secondary | ICD-10-CM

## 2022-02-28 DIAGNOSIS — M9903 Segmental and somatic dysfunction of lumbar region: Secondary | ICD-10-CM

## 2022-02-28 DIAGNOSIS — M9908 Segmental and somatic dysfunction of rib cage: Secondary | ICD-10-CM | POA: Diagnosis not present

## 2022-02-28 DIAGNOSIS — M9902 Segmental and somatic dysfunction of thoracic region: Secondary | ICD-10-CM | POA: Diagnosis not present

## 2022-02-28 DIAGNOSIS — M9901 Segmental and somatic dysfunction of cervical region: Secondary | ICD-10-CM

## 2022-02-28 NOTE — Assessment & Plan Note (Signed)
Chronic problem but stable.  Patient is still being able to work out on a regular basis.  Discussed icing regimen and home exercises, which activities to do which ones to avoid, increase activity slowly.  Follow-up again in 6 to 8 weeks. ?

## 2022-02-28 NOTE — Patient Instructions (Addendum)
Always good to see you ?Keep being as active as you are ?Have fun in [place]  ?See you again in 6-8 weeks ?

## 2022-03-21 ENCOUNTER — Encounter: Payer: Self-pay | Admitting: Family Medicine

## 2022-03-22 ENCOUNTER — Encounter: Payer: Self-pay | Admitting: Neurology

## 2022-03-22 DIAGNOSIS — Z0279 Encounter for issue of other medical certificate: Secondary | ICD-10-CM

## 2022-03-22 NOTE — Telephone Encounter (Signed)
Left message for patient to call back to schedule with Dr. Glennon Mac.  ?

## 2022-04-02 ENCOUNTER — Other Ambulatory Visit: Payer: Self-pay | Admitting: Neurology

## 2022-04-02 DIAGNOSIS — G20 Parkinson's disease: Secondary | ICD-10-CM

## 2022-04-05 ENCOUNTER — Other Ambulatory Visit: Payer: Self-pay

## 2022-04-06 NOTE — Progress Notes (Signed)
? ? ?Assessment/Plan:  ? ?1.  Parkinsons Disease ? -The patient underwent bilateral STN DBS with [employer] device on 02/10/2017 and had IPG placement on 02/17/2017.    He did have his battery changed on October 19, 2018 to an MRI compatible battery. ?            -Continue [name], 95 mg, 2 tablets 4 times per day ? -Continue amantadine, 100 mg 3 times per day.  I do think it is helping the dyskinesia.  I saw no evidence of dyskinesia today and he is on it. ? -We did discuss some updates in Parkinson's, including CSF and skin tests for alpha-synuclein neuropathies.  He really does not need these. ? ?2.  Fatigue  ?            -Ultimately, this is likely related to the disease, but I am not sure that medications are going to be helpful.  We have tried Provigil, Nuvigil and selegiline, all without relief.  He is exercising.  Today, I did suggest that we go ahead and retry Provigil.  It has been a long time since we tried this.  His daughter's wedding is upcoming and I really would like him to feel well for this.  He agreed to that he would like to retry it.  He was given a prescription for 200 mg in the morning.  We could always go up to 400 mg if needed. ? ?3.  REM behavior disorder. ?            -On clonazepam 0.5 mg, half tablet at night.  PDMP has been reviewed.  No red flags. ? ? ?Subjective:  ? ?Ian PurserKenneth [name] was seen today in follow up for Parkinsons disease.  My previous records were reviewed prior to todays visit as well as outside records available to me. Pt denies falls.  Pt denies lightheadedness, near syncope.  No hallucinations.  Has been to therapy since our last visit.  Those notes are reviewed.  He also follows with Dr. Katrinka BlazingSmith, last being seen March 13.  Forms have been filled out for him since our last visit.  He still has notable fatigue.  His daughter is getting married very soon.  He is a bit nervous about this and asks about Ativan, although he admittedly is not much of a medicine taker.   Discussed with him that I really would have no objection to it, so long as it is not mixed with a lot of alcohol (or his clonazepam). ? ?Current prescribed movement disorder medications: ?[name] 95 mg, 2 tablets 4 times per day  ?Clonazepam 0.5 mg, half tablet at night ?Opicapone, 50 mg (started last visit)  ?Amantadine, 100 mg 3 times per day. ?  ?PREVIOUS MEDICATIONS:  Requip; Neupro (tried only a few days); propranolol helped tremor some; Inbrija (samples given but not taken); Provigil; Nuvigil; selegiline (no help for fatigue); opicapone (increased dyskinesia); amantadine  ? ? ?ALLERGIES:  No Known Allergies ? ?CURRENT MEDICATIONS:  ?Outpatient Encounter Medications as of 04/11/2022  ?Medication Sig  ? amantadine (SYMMETREL) 100 MG capsule Take 1 capsule (100 mg total) by mouth in the morning, at noon, and at bedtime.  ? Ascorbic Acid (VITAMIN C PO) Take 3 tablets by mouth 3 (three) times daily.   ? b complex vitamins tablet Take 1 tablet by mouth 2 (two) times daily.  ? Carbidopa-Levodopa ER ([name]) 23.75-95 MG CPCR TAKE 2 CAPSULES FOUR TIMES A DAY  ? clonazePAM (KLONOPIN) 0.5 MG tablet  TAKE 1/2 TABLET(0.25 MG) BY MOUTH AT BEDTIME  ? ketoconazole (NIZORAL) 2 % cream Apply 1 application topically daily. To affected areas.  ? Melatonin 3 MG CAPS Take 3 mg by mouth at bedtime.  ? terbinafine (LAMISIL) 1 % cream Apply 1 application topically 2 (two) times daily.  ? ?No facility-administered encounter medications on file as of 04/11/2022.  ? ? ?Objective:  ? ?PHYSICAL EXAMINATION:   ? ?VITALS:   ?Vitals:  ? 04/11/22 1015  ?BP: 112/62  ?Pulse: 82  ?SpO2: 97%  ?Weight: 168 lb 3.2 oz (76.3 kg)  ?Height: 6\' 2"  (1.88 m)  ? ? ? ? ?GEN:  The patient appears stated age and is in NAD. ?HEENT:  Normocephalic, atraumatic.   ? ?Neurological examination: ? ?Orientation: The patient is alert and oriented x3. ?Cranial nerves: There is good facial symmetry with minimal facial hypomimia.  Extraocular muscles are intact.  The speech  is fluent and mildly dysarthric.  He is just slightly hypophonic.  Soft palate rises symmetrically and there is no tongue deviation. Hearing is intact to conversational tone. ?Sensation: Sensation is intact to light touch throughout ?Motor: Strength is 5/5 in the bilateral upper extremities.  No pronator drift. ? ?Movement examination: ?Tone: There is normal tone on the left.  He has mild increased tone in the right upper extremity (stable) ?Abnormal movements: There is no tremor or dyskinesia today. ?Coordination:  There is no decremation with any form of RAMS, including alternating supination and pronation of the forearm, hand opening and closing, finger taps, heel taps and toe taps. ?Gait and Station: The patient is able to ambulate well.  He has good stride length.  He has good arm swing today bilaterally. ? ?I have reviewed and interpreted the following labs independently ? ?  Chemistry   ?   ?Component Value Date/Time  ? NA 141 10/15/2018 1533  ? K 4.4 10/15/2018 1533  ? CL 105 10/15/2018 1533  ? CO2 28 10/15/2018 1533  ? BUN 16 10/15/2018 1533  ? CREATININE 1.05 10/15/2018 1533  ? CREATININE 0.91 11/25/2014 1051  ?    ?Component Value Date/Time  ? CALCIUM 9.7 10/15/2018 1533  ? ALKPHOS 69 06/10/2015 1155  ? AST 27 06/10/2015 1155  ? ALT 40 06/10/2015 1155  ? BILITOT 1.0 06/10/2015 1155  ?  ? ? ? ?Lab Results  ?Component Value Date  ? WBC 6.5 10/15/2018  ? HGB 15.4 10/15/2018  ? HCT 48.2 10/15/2018  ? MCV 91.6 10/15/2018  ? PLT 302 10/15/2018  ? ? ?No results found for: TSH ? ? ? ?Cc:  Bernerd Limbo, MD ? ?

## 2022-04-11 ENCOUNTER — Ambulatory Visit: Payer: Medicare Other | Admitting: Family Medicine

## 2022-04-11 ENCOUNTER — Ambulatory Visit (INDEPENDENT_AMBULATORY_CARE_PROVIDER_SITE_OTHER): Payer: Medicare Other | Admitting: Neurology

## 2022-04-11 ENCOUNTER — Encounter: Payer: Self-pay | Admitting: Neurology

## 2022-04-11 VITALS — BP 112/62 | HR 82 | Ht 74.0 in | Wt 168.2 lb

## 2022-04-11 DIAGNOSIS — R5383 Other fatigue: Secondary | ICD-10-CM | POA: Diagnosis not present

## 2022-04-11 DIAGNOSIS — G20B1 Parkinson's disease with dyskinesia, without mention of fluctuations: Secondary | ICD-10-CM

## 2022-04-11 DIAGNOSIS — G20A1 Parkinson's disease without dyskinesia, without mention of fluctuations: Secondary | ICD-10-CM

## 2022-04-11 DIAGNOSIS — G20 Parkinson's disease: Secondary | ICD-10-CM | POA: Diagnosis not present

## 2022-04-11 DIAGNOSIS — G249 Dystonia, unspecified: Secondary | ICD-10-CM

## 2022-04-11 MED ORDER — MODAFINIL 200 MG PO TABS: 200.0000 mg | ORAL_TABLET | ORAL | 1 refills | Status: AC

## 2022-04-11 NOTE — Procedures (Signed)
DBS Programming was performed.   ? ?Manufacturer of DBS device: [employer] ? ?Total time spent programming was 10 minutes.  Device was confirmed to be on.  Soft start was confirmed to be on.  Impedences were checked and were within normal limits.  Battery was checked and was determined to be functioning normally and not near the end of life.  Final settings were as follows: ? ? ? Active Contact Amplitude (mA) PW (ms) Frequency (hz) Side Effects  ?Left Brain       ?11/23/20 3-(90%)4-(10%)C+ 3.8 (2.8-4.0) 60 130   ?04/26/21 3-(90%)4-(10%)C+ 3.8 60 130   ?11/02/21 3-(90%)4-(10%)C+ 3.8 60 130   ?04/11/22 3-(90%)4-(10%)C+ 3.8 60 130   ?       ?       ?Right Brain       ?11/23/20 2-(30%)3-(70%)C+ 3.2 60 130 Tried PW of 70 but not tolerated (?dizzy)  ?04/26/21 2-(30%)3-(70%)C+ 3.2 60 130   ?11/02/21 2-(30%)3-(70%)C+ 3.1 60 130   ?04/11/22 2-(30%)3-(70%)C+ 3.2 60 130   ?       ?       ? ? ?

## 2022-04-14 NOTE — Progress Notes (Signed)
?  [name] D.O. ?[facility] ?[address] [place] [address] ?Phone: [phone] ?Subjective:   ?I, Ian [name], am serving as a scribe for Dr. Antoine Primas. ? ?This visit occurred during the SARS-CoV-2 public health emergency.  Safety protocols were in place, including screening questions prior to the visit, additional usage of staff PPE, and extensive cleaning of exam room while observing appropriate contact time as indicated for disinfecting solutions.  ? ? ?I'm seeing this patient by the request  of:  Tracey Harries, MD ? ?CC: back and neck pain  ? ?[id number]  ?Ian [name] is a 61 y.o. male coming in with complaint of back and neck pain. OMT 02/28/2022. Patient states overall doing well.  Some mild tightness but nothing severe.  Nothing that stops him from activity. ? ?Medications patient has been prescribed: None ? ? ? ? ?  ? ? ? ?Past Medical History:  ?Diagnosis Date  ? Anxiety   ? History of kidney stones   ? passed  ? PD (Parkinson's disease) (HCC) 09/10/2013  ? PONV (postoperative nausea and vomiting)   ? nausea   ?  ?No Known Allergies ? ? ?Review of Systems: ? No headache, visual changes, nausea, vomiting, diarrhea, constipation, dizziness, abdominal pain, skin rash, fevers, chills, night sweats, weight loss, swollen lymph nodes, body aches, joint swelling, chest pain, shortness of breath, mood changes. POSITIVE muscle aches ? ?Objective  ?Blood pressure 118/70, pulse 99, height 6\' 2"  (1.88 m), weight 169 lb (76.7 kg), SpO2 97 %. ?  ?General: No apparent distress alert and oriented x3 mood and affect normal, dressed appropriately.  ?HEENT: Pupils equal, extraocular movements intact  ?Respiratory: Patient's speak in full sentences and does not appear short of breath  ?Cardiovascular: No lower extremity edema, non tender, no erythema  ?Tightness noted.  Patient does have some discomfort noted in the parascapular region. ? ? ?Osteopathic findings ? ?C2 flexed rotated and side  bent right ?T3 extended rotated and side bent right inhaled rib ?L5 flexed rotated and side bent left ?Sacrum right on right ? ? ? ? ?  ?Assessment and Plan: ? ?Slipped rib syndrome ?Still some tightness but overall doing relatively well.  Responded extremely well to osteopathic manipulation.  Continue to have patient continue to stay active follow-up with me again in 6 to 8 weeks.  ? ?Nonallopathic problems ? ?Decision today to treat with OMT was based on Physical Exam ? ?After verbal consent patient was treated with HVLA, ME, FPR techniques in cervical, rib, thoracic, lumbar, and sacral  areas ? ?Patient tolerated the procedure well with improvement in symptoms ? ?Patient given exercises, stretches and lifestyle modifications ? ?See medications in patient instructions if given ? ?Patient will follow up in 4-8 weeks ? ?  ? ? ?The above documentation has been reviewed and is accurate and complete , DO ? ? ? ?  ? ? Note: This dictation was prepared with Dragon dictation along with smaller phrase technology. Any transcriptional errors that result from this process are unintentional.    ?  ?  ? ?

## 2022-04-25 ENCOUNTER — Ambulatory Visit (INDEPENDENT_AMBULATORY_CARE_PROVIDER_SITE_OTHER): Payer: Medicare Other | Admitting: Family Medicine

## 2022-04-25 ENCOUNTER — Encounter: Payer: Self-pay | Admitting: Family Medicine

## 2022-04-25 VITALS — BP 118/70 | HR 99 | Ht 74.0 in | Wt 169.0 lb

## 2022-04-25 DIAGNOSIS — M9908 Segmental and somatic dysfunction of rib cage: Secondary | ICD-10-CM

## 2022-04-25 DIAGNOSIS — M940 Chondrocostal junction syndrome [Tietze]: Secondary | ICD-10-CM | POA: Diagnosis not present

## 2022-04-25 DIAGNOSIS — M9902 Segmental and somatic dysfunction of thoracic region: Secondary | ICD-10-CM | POA: Diagnosis not present

## 2022-04-25 DIAGNOSIS — M9901 Segmental and somatic dysfunction of cervical region: Secondary | ICD-10-CM | POA: Diagnosis not present

## 2022-04-25 DIAGNOSIS — M9904 Segmental and somatic dysfunction of sacral region: Secondary | ICD-10-CM

## 2022-04-25 DIAGNOSIS — G20 Parkinson's disease: Secondary | ICD-10-CM

## 2022-04-25 DIAGNOSIS — M9903 Segmental and somatic dysfunction of lumbar region: Secondary | ICD-10-CM

## 2022-04-25 NOTE — Assessment & Plan Note (Signed)
Still some tightness but overall doing relatively well.  Responded extremely well to osteopathic manipulation.  Continue to have patient continue to stay active follow-up with me again in 6 to 8 weeks. ?

## 2022-04-25 NOTE — Patient Instructions (Signed)
See me in 6-7 weeks ?Doing great! ?

## 2022-04-25 NOTE — Therapy (Signed)
[facility] [facility] ?[address] ?[place], [place], [address] ?Phone: [phone]   Fax:  [phone] ? ?Patient Details  ?Name: Ian [name] ?MRN: [id number] ?Date of Birth: Apr 24, 1961 ?Referring Provider:  Tracey Harries, MD ? ?Encounter Date: 04/28/2022 ? ?Occupational Therapy Parkinson's Disease Screen ? ?Hand dominance:  right  ? ?Physical Performance Test item #4 (donning/doffing jacket):  6.54 sec ? ?Fastening/unfastening 3 buttons in:  15.88 sec ? ?9-hole peg test:    RUE  25.97 sec        LUE  27.07 sec ? ?Box & Blocks Test:   RUE  50  blocks        LUE  51 blocks ? ?Change in ability to perform ADLs/IADLs:  Maybe mild change with writing--mild micrographia, 98% legibility (letter).  noted fast, small movements when cleaning hands with sanitizer, particularly with L hand.   ? ?Other Comments:  PWR! Moves, spin class, lift weights.   ? ?Pt would benefit from occupational therapy evaluation due to timing deficits, hypokinesia.   ? ? ?Willa Frater, OTR/L ?[facility] ?[address] ?[place], [place]  [address] ?[phone] phone ?[phone] ?04/28/22 8:33 AM ? ? ?

## 2022-04-28 ENCOUNTER — Ambulatory Visit: Payer: Medicare Other | Attending: Family Medicine | Admitting: Occupational Therapy

## 2022-04-28 ENCOUNTER — Ambulatory Visit: Payer: Medicare Other

## 2022-04-28 ENCOUNTER — Ambulatory Visit: Payer: Medicare Other | Admitting: Physical Therapy

## 2022-04-28 ENCOUNTER — Telehealth: Payer: Self-pay

## 2022-04-28 DIAGNOSIS — R471 Dysarthria and anarthria: Secondary | ICD-10-CM | POA: Insufficient documentation

## 2022-04-28 DIAGNOSIS — R278 Other lack of coordination: Secondary | ICD-10-CM | POA: Insufficient documentation

## 2022-04-28 DIAGNOSIS — R29818 Other symptoms and signs involving the nervous system: Secondary | ICD-10-CM | POA: Insufficient documentation

## 2022-04-28 DIAGNOSIS — R293 Abnormal posture: Secondary | ICD-10-CM | POA: Insufficient documentation

## 2022-04-28 DIAGNOSIS — R2681 Unsteadiness on feet: Secondary | ICD-10-CM | POA: Insufficient documentation

## 2022-04-28 DIAGNOSIS — R29898 Other symptoms and signs involving the musculoskeletal system: Secondary | ICD-10-CM | POA: Insufficient documentation

## 2022-04-28 DIAGNOSIS — G20 Parkinson's disease: Secondary | ICD-10-CM

## 2022-04-28 NOTE — Telephone Encounter (Signed)
Dr. Arbutus Leas,  ?  ?Ian [name] was seen for Parkinsons OT, PT, and ST screens on 04/28/22. Pt would benefit from OT referral (due to timing deficits and hypokinesia for functional tasks) and ST referral (due to reduced speech intelligibility). If you are in agreement, please send updated order for Occupational and Speech therapy to our [address] location via [employer]. ?  ?Thank you, ?  ?Zena Amos, MA CCC-SLP ?[facility] ?[address] ?[place], [place]  [address] ?[phone] phone ?[phone] ?  ?

## 2022-04-28 NOTE — Therapy (Signed)
[place] ?[facility] ?[address] ?[place], [place], [address] ?Phone: [phone]   Fax:  [phone] ? ?Patient Details  ?Name: Ian [name] ?MRN: [id number] ?Date of Birth: [birth year] ?Referring Provider:  Tracey Harries, MD ? ?Encounter Date: 04/28/2022 ? ?Physical Therapy Parkinson's Disease Screen ? ? ?Timed Up and Go test: 6.9 sec (previously 8.19 sec) ? ?10 meter walk test:7.31 sec =4.48 ft/sec (previously 7.37 sec = 4.45 ft/sec) ? ?5 time sit to stand test:9.7 sec (previously 9.03 seconds) ? ?Notices that his balance and walking has been good and pretty consistent. Lift weights 3x a week, cardio a few times a week, PWR classes, spin classes. Plays golf on Friday. No falls. ? ? ?Patient does not require Physical Therapy services at this time.  Recommend Physical Therapy screen in 6-8 months for PT  ? ? ? ?Drake Leach, PT, DPT  ?04/28/2022, 8:44 AM ? ?[facility] ?[facility] ?[address] ?[place], [place], [address] ?Phone: [phone]   Fax:  [phone] ?

## 2022-04-28 NOTE — Addendum Note (Signed)
Addended by: Kerin Salen S on: 04/28/2022 01:01 PM ? ? Modules accepted: Orders ? ?

## 2022-04-28 NOTE — Therapy (Signed)
[facility] ?[facility] ?[address] ?[place], [place], [address] ?Phone: [phone]   Fax:  [phone] ? ?Patient Details  ?Name: Ian [name] ?MRN: [id number] ?Date of Birth: 1961/08/13 ?Referring Provider:  Kerin Salen DO ? ?Encounter Date: 04/28/2022 ? ?Speech Therapy Parkinson's Disease Screen  ? ?Decibel Level today: ~70s dB (WNL=70-72 dB) with sound level meter 30cm away from pt's mouth. Pt's conversational volume has remained consistent since last treatment course. Pt continues to report difficulty with speech rate control and articulation indicating more difficulty "than I would like." ? ?Pt does not report difficulty with swallowing or cognition warranting further evaluation ? ?Pt would benefit from speech-language eval for dysarthria - please order via [facility] or call [phone] to schedule ? ?Gracy Racer, CCC-SLP ?04/28/2022, 8:14 AM ? ?[facility] ?[facility] ?[address] ?[place], [place], [address] ?Phone: [phone]   Fax:  [phone] ?

## 2022-05-02 NOTE — Therapy (Signed)
?OUTPATIENT OCCUPATIONAL THERAPY PARKINSON'S EVALUATION ? ?Patient Name: Ian [name] ?MRN: [id number] ?DOB:1961-07-05, 61 y.o., male ?Today's Date: 05/03/2022 ? ?PCP: Dr. Tracey Harriesavid Bouska ?REFERRING PROVIDER: Dr. [name] [name] ? ? OT End of Session - 05/03/22 0907   ? ? Visit Number 1   ? Number of Visits 13   ? Date for OT Re-Evaluation 07/02/22   ? Authorization Type Medicare & Tricare for Life--Covered 100%   ? Authorization - Visit Number 1   ? Authorization - Number of Visits 10   ? Progress Note Due on Visit 10   ? OT Start Time [id number]   ? OT Stop Time 0845   ? OT Time Calculation (min) 39 min   ? Activity Tolerance Patient tolerated treatment well   ? Behavior [facility] for tasks assessed/performed   ? ?  ?  ? ?  ? ? ?Past Medical History:  ?Diagnosis Date  ? Anxiety   ? History of kidney stones   ? passed  ? PD (Parkinson's disease) (HCC) 09/10/2013  ? PONV (postoperative nausea and vomiting)   ? nausea   ? ?Past Surgical History:  ?Procedure Laterality Date  ? COLONOSCOPY    ? MINOR PLACEMENT OF FIDUCIAL N/A 01/31/2017  ? Procedure: MINOR PLACEMENT OF FIDUCIAL;  Surgeon: [name], MD;  [facility];  Service: Neurosurgery;  Laterality: N/A;  Fiducial placement  ? none    ? PULSE GENERATOR IMPLANT Right 02/17/2017  ? Procedure: Right implantable pulse generator placement;  Surgeon: [name], MD;  [facility];  Service: Neurosurgery;  Laterality: Right;  Bilateral implantable pulse generator placement  ? SUBTHALAMIC STIMULATOR BATTERY REPLACEMENT N/A 10/19/2018  ? Procedure: Deep Brain stimulator implantable pulse generator revision;  Surgeon: [name], [name], MD;  [facility];  Service: Neurosurgery;  Laterality: N/A;  Deep Brain stimulator implantable pulse generator revision  ? SUBTHALAMIC STIMULATOR INSERTION Bilateral 02/10/2017  ? Procedure: Bilateral Deep brain stimulator placement;  Surgeon: [name], MD;  [facility];  Service: Neurosurgery;  Laterality: Bilateral;  Bilateral  Deep brain stimulator placement  ? UPPER GASTROINTESTINAL ENDOSCOPY    ? ?Patient Active Problem List  ? Diagnosis Date Noted  ? Closed fracture of phalanx of left fifth toe 10/19/2021  ? Tinea versicolor 05/28/2021  ? Left rotator cuff tear 08/20/2020  ? Nonallopathic lesion of sacral region 07/25/2018  ? Nonallopathic lesion of lumbosacral region 07/25/2018  ? 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 Acute bursitis of right shoulder 12/06/2017  ? Antalgic gait 01/24/2017  ? Intercostal muscle tear 07/19/2016  ? PD (Parkinson's disease) (HCC) 06/24/2016  ? 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 Nonallopathic lesion of thoracic region 01/25/2016  ? Nonallopathic lesion-rib cage 01/25/2016  ? Nonallopathic lesion of cervical region 01/25/2016  ? Parkinson's disease (HCC) 01/25/2016  ? Lateral epicondylitis 12/01/2014  ? REM behavioral disorder 08/27/2014  ? Paralysis agitans (HCC) 10/18/2013  ? ? ?ONSET DATE: PD screen date 04/21/22 ? ?REFERRING DIAG:  G20 (ICD-10-CM) - Parkinson's disease (HCC)  ? ?THERAPY DIAG:  ?Other symptoms and signs involving the nervous system ? ?Other lack of coordination ? ?Abnormal posture ? ?Unsteadiness on feet ? ?Other symptoms and signs involving the musculoskeletal system ? ?SUBJECTIVE:  ? ?SUBJECTIVE STATEMENT: ?Been getting ready for dtr's [place] Saturday [place]in [place]  Pt reports that he has noticed speeding up with activities (including golf).  incr anxiety today and incr speed with speech.  Pt reports that between 8-9:15am is his worst time, some times afternoon is  a "worse" time due to fatigue if very active in the morning.  Pt reports that he does not notice much change with movement with PD meds now, but notices a difference with mood and energy. ? ?Pt accompanied by: self ? ?PERTINENT HISTORY:  bilateral STN DBS 01/2017, hx of partial tear of L rotator cuff, hx of bilateral shoulder impingement per pt, hx of slipped rib syndrome, anxiety ? ?PRECAUTIONS: bilateral STN DBS  01/2017 ? ?WEIGHT BEARING RESTRICTIONS No ? ?PAIN:  ?Are you having pain? No ? ?FALLS: Has patient fallen in last 6 months? No ? ?LIVING ENVIRONMENT: ?Lives with: lives with their spouse ? ? ?PLOF: Independent, and Leisure: golf, goes to [employer]! Moves, cycling, and lifts weights ? ?PATIENT GOALS improve writing, improve golf (ball on tee, putting--speed), improve putting on necklace, cutting food, taking out contact ? ?OBJECTIVE:  ? ?HAND DOMINANCE: Right ? ?ADLs: ?Transfers/ambulation related to ADLs: mod I ?Eating: difficulty cutting food at times ?Grooming: difficulty using regular toothbrush (uses electric 1x/day), difficulty with taking contact out  ?UB Dressing: mod I ?LB Dressing: mod I  ?Toileting: mod I ?Bathing: mod I, shower stall ? ? ?IADLs: ?Light housekeeping: mod I (does a lot of yardwork) ?Meal Prep: pt reports min difficulty flipping eggs ?Community mobility: mod I, more deliberate (no distractions), min changes with parking (has back-up camera) ?Medication management: mod I ?Handwriting: 90% legible and Mild micrographia ?Typing:  "hunt and peck" now, min difficulty using mouse--doesn't use a lot ? ?MOBILITY STATUS: Independent ? ?POSTURE COMMENTS:  ?rounded shoulders ? ?FUNCTIONAL OUTCOME MEASURES: ?Standing functional reach: Right: 11 inches; Left: 10 inches ?Fastening/unfastening 3 buttons: 19.90sec ?Physical performance test: PPT#2 (simulated eating) 14.0sec (holds near end of spoon) & PPT#4 (donning/doffing jacket): 8.44 sec, decr trunk rotation, small base of support ? ?Functional Tasks Recording Form (1=not difficult, 7=unable):  ? Writing more legibly 4/7 (moderately difficult) ? Putting the golf ball on tee 4/7 ? Puttting the golf ball (speed)  4/7 ? Putting on jewelry/necklace  4/7  ? Cutting food  ? Putting contacts in and out   5/7 (very difficult) ? ?COORDINATION: ?9 Hole Peg test: Right: 25.78 sec; Left: 27.79 sec ?Box and Blocks:  Right 54blocks, Left 53blocks ?Tremors: some in  feet ? ?UE ROM:  BUEs grossly WNL; R shoulder flex 130* with -10* elbow ext, L shoulder flex 130* with -10* elbow ext ? ?UE MMT:   Not tested ? ?SENSATION: ?Not tested ? ?MUSCLE TONE: RUE: Within functional limits and LUE: Within functional limits ? ?COGNITION: ?Overall cognitive status: Within functional limits for tasks assessed ? ? ?OBSERVATIONS: ?Dystonia (shoulder hike noted with coordination activities, R>L) and Hypokinesia with timing deficits (speeds up) ? ? ?TODAY'S TREATMENT:  ?N/a today ? ? ?PATIENT EDUCATION: ?Education details: OT eval results and POC ?Person educated: Patient ?Education method: Explanation ?Education comprehension: verbalized understanding ? ? ?HOME EXERCISE PROGRAM: ?Not yet issued ? ?ASSESSMENT: ? ?CLINICAL IMPRESSION: ?Patient is a 61 y.o. male who was seen today for occupational therapy evaluation for Parkinson's disease.  Pt is known to this therapist from previous occupational therapy, but has not been seen for OT eval and treatment for >5 years.  Pt is independent, but noted to begin to have incr hypokinesia with timing deficits at PD screen 04/28/22; and therefore, occupational therapy was recommended.  PMH includes:  bilateral STN DBS 01/2017, hx of partial tear of L rotator cuff, hx of bilateral shoulder impingement per pt, hx of slipped rib syndrome, anxiety.  Pt presents today  with decr coordination, timing deficits with movement, hypokinesia, decr posture, mild decr balance for IADLs, ?dystonia. Pt would benefit from occupational therapy to address these deficits for incr ease with ADLs/IADLs, update HEP, and decr risk for future complications related to PD. ? ?PERFORMANCE DEFICITS in functional skills including ADLs, IADLs, coordination, dexterity, tone, ROM, FMC, GMC, balance, and UE functional use and psychosocial skills including habits.  ? ?IMPAIRMENTS are limiting patient from ADLs, IADLs, and leisure.  ? ?COMORBIDITIES may have co-morbidities  that affects  occupational performance. Patient will benefit from skilled OT to address above impairments and improve overall function. ? ?MODIFICATION OR ASSISTANCE TO COMPLETE EVALUATION: Min-Moderate modification of tasks or assist with

## 2022-05-03 ENCOUNTER — Encounter: Payer: Self-pay | Admitting: Occupational Therapy

## 2022-05-03 ENCOUNTER — Ambulatory Visit: Payer: Medicare Other | Admitting: Speech Pathology

## 2022-05-03 ENCOUNTER — Ambulatory Visit: Payer: Medicare Other | Admitting: Occupational Therapy

## 2022-05-03 DIAGNOSIS — R293 Abnormal posture: Secondary | ICD-10-CM | POA: Diagnosis present

## 2022-05-03 DIAGNOSIS — R29818 Other symptoms and signs involving the nervous system: Secondary | ICD-10-CM | POA: Diagnosis present

## 2022-05-03 DIAGNOSIS — R2681 Unsteadiness on feet: Secondary | ICD-10-CM

## 2022-05-03 DIAGNOSIS — R278 Other lack of coordination: Secondary | ICD-10-CM | POA: Diagnosis present

## 2022-05-03 DIAGNOSIS — R471 Dysarthria and anarthria: Secondary | ICD-10-CM

## 2022-05-03 DIAGNOSIS — R29898 Other symptoms and signs involving the musculoskeletal system: Secondary | ICD-10-CM | POA: Diagnosis present

## 2022-05-03 NOTE — Patient Instructions (Signed)
SPEAK OUT! is a structured program targeting voice in patient's with Parkinson's. This program was developed by [employer]. It is evidence based and based on principles of motor learning. The nonprofit provides free materials to patients being treated by a Speak Out! certified SLP.   www.parkinsonvoiceproject.org for more information  Learn about Parkinson's webinar: https://parkinsonvoiceproject.org/education-training/monthly-webinar/ -- highly recommend watching this webinar!!  Order your stimulus booklet: [phone] Your provider: Jeovanny Cuadros SLP, [facility]  This book is free!! They do offer a "pay if forward" option where you can donate to the non-profit organization so they can continue serving the Parkinson's population and providing these valuable resources to patients.    

## 2022-05-03 NOTE — Addendum Note (Signed)
Addended by: Link Snuffer L on: 05/03/2022 11:42 AM ? ? Modules accepted: Orders ? ?

## 2022-05-03 NOTE — Therapy (Signed)
?OUTPATIENT SPEECH LANGUAGE PATHOLOGY PARKINSON'S EVALUATION ? ? ?Patient Name: Ian PurserKenneth [name] ?MRN: [id number] ?DOB:1961-11-28, 61 y.o., male ?Today's Date: 05/03/2022 ? ?PCP: Tracey HarriesBouska, David, MD ?REFERRING PROVIDER: Vladimir Fasterat, [name] S, DO ? ? ? ?Past Medical History:  ?Diagnosis Date  ? Anxiety   ? History of kidney stones   ? passed  ? PD (Parkinson's disease) (HCC) 09/10/2013  ? PONV (postoperative nausea and vomiting)   ? nausea   ? ?Past Surgical History:  ?Procedure Laterality Date  ? COLONOSCOPY    ? MINOR PLACEMENT OF FIDUCIAL N/A 01/31/2017  ? Procedure: MINOR PLACEMENT OF FIDUCIAL;  Surgeon: [name], MD;  [facility];  Service: Neurosurgery;  Laterality: N/A;  Fiducial placement  ? none    ? PULSE GENERATOR IMPLANT Right 02/17/2017  ? Procedure: Right implantable pulse generator placement;  Surgeon: [name], MD;  [facility];  Service: Neurosurgery;  Laterality: Right;  Bilateral implantable pulse generator placement  ? SUBTHALAMIC STIMULATOR BATTERY REPLACEMENT N/A 10/19/2018  ? Procedure: Deep Brain stimulator implantable pulse generator revision;  Surgeon: [name], [name], MD;  [facility];  Service: Neurosurgery;  Laterality: N/A;  Deep Brain stimulator implantable pulse generator revision  ? SUBTHALAMIC STIMULATOR INSERTION Bilateral 02/10/2017  ? Procedure: Bilateral Deep brain stimulator placement;  Surgeon: [name], MD;  [facility];  Service: Neurosurgery;  Laterality: Bilateral;  Bilateral Deep brain stimulator placement  ? UPPER GASTROINTESTINAL ENDOSCOPY    ? ?Patient Active Problem List  ? Diagnosis Date Noted  ? Closed fracture of phalanx of left fifth toe 10/19/2021  ? Tinea versicolor 05/28/2021  ? Left rotator cuff tear 08/20/2020  ? Nonallopathic lesion of sacral region 07/25/2018  ? Nonallopathic lesion of lumbosacral region 07/25/2018  ? 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 Acute bursitis of right shoulder 12/06/2017  ? Antalgic gait 01/24/2017  ? Intercostal  muscle tear 07/19/2016  ? PD (Parkinson's disease) (HCC) 06/24/2016  ? 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 Nonallopathic lesion of thoracic region 01/25/2016  ? Nonallopathic lesion-rib cage 01/25/2016  ? Nonallopathic lesion of cervical region 01/25/2016  ? Parkinson's disease (HCC) 01/25/2016  ? Lateral epicondylitis 12/01/2014  ? REM behavioral disorder 08/27/2014  ? Paralysis agitans (HCC) 10/18/2013  ? ? ?ONSET DATE: dx Parkinson's 2014, referred for ST 04/28/2022 ? ?REFERRING DIAG: G20 (ICD-10-CM) - Parkinson's disease (HCC) ? ?THERAPY DIAG:  ?Dysarthria and anarthria ? ?SUBJECTIVE:  ? ?SUBJECTIVE STATEMENT: ?"I have always talked fast" ?Pt accompanied by: self ? ?PERTINENT HISTORY: Dx with PD Fall 2014, symptoms began 2012. Primary concerns are lethargy, anxiety, fast rate of speech.  ? ?PAIN:  ?Are you having pain? No ? ?FALLS: Has patient fallen in last 6 months?  No ? ?LIVING ENVIRONMENT: ?Lives with: lives with their spouse ?Lives in: House/apartment ? ?PLOF:  ?Level of assistance: Independent with IADLs ?Employment: Retired ? ?PATIENT GOALS "to be understood" ? ?OBJECTIVE:  ? ?COGNITION: ?Overall cognitive status: Within functional limits for tasks assessed ?Comments: Pt reports increased awareness re: cognitive fx d/t family history of dementia ? ?MOTOR SPEECH: ?Overall motor speech: impaired ?Level of impairment: Sentence and Conversation ?Respiration: thoracic breathing ?Phonation: normal ?Resonance: WFL ?Articulation: Impaired: sentence and conversation ?Intelligibility: Intelligibility reduced ?Motor planning: Impaired: inconsistent ?Motor speech errors: aware and inconsistent ?Interfering components: premorbid status ?Effective technique: slow rate, increased vocal intensity, over articulate, and pacing ? ?ORAL MOTOR ASSESSMENT:   ?WFL ? ? ?OBJECTIVE ASSESSMENT: ? ?Measured when a sound level meter was placed 30 cm away from pt's mouth, 10 minutes  of moderate to complex conversational speech was  within gross normal limits today, at average 71 dB (WNL= average 70-72dB) with range of 67- 74 dB. Overall speech intelligibility for this listener in a quiet environment was affected, at approximately 85%, though not 2/2 volume. Primary factor impacting intelligibility was variable rate of speech. Frequent rushes of speech, occasional part-word repetitions observed during rushes. Given instruction and usual mod-A, pt able to reduce rate in oral reading task resulting in increased intelligibility.  ? ?Pt does not report difficulty with swallowing warranting further evaluation ? ? ?PATIENT REPORTED OUTCOME MEASURES (PROM): ?Communication Participation Item Bank: 8 ?"Very much" difficulty: communicating in small group of people, having long conversation, giving detailed information, persuading others to see point of view ?"Quite a bit" difficulty: talking to people you know, communicating quickly, communicating out in community, asking ? In conversation ? ? ?TODAY'S TREATMENT:  ?Education on SLP observations, evaluation results, recommendations for skilled ST. Instruction on using intentional speech to control rate, resulting in increased intelligibility. Oral reading practice with usual mod-A for rate.  ? ? ?PATIENT EDUCATION: ?Education details: see above ?Person educated: Patient ?Education method: Explanation, Demonstration, Verbal cues, and Handouts ?Education comprehension: verbalized understanding, returned demonstration, verbal cues required, and needs further education ? ? ?HOME EXERCISE PROGRAM: ?Order Speak Out! workbook ? ? ? ? ?GOALS: ?Goals reviewed with patient? Yes ? ?SHORT TERM GOALS: Target date: 05/31/2022   ? ?Pt will maintain WNL rate of speech during structured practice (oral reading, cognitive exercise, etc.), multi-sentence level, with occasional min-A over 2 sessions.  ?Baseline: ?Goal status: INITIAL ? ?2.  Pt will self-ID rushes in speech, as they occur, in 50% of opportunities to increase  ability to self-correct rate with occasional min-A over 2 sessions.  ?Baseline:  ?Goal status: INITIAL ? ?3.  Pt will report completion of dysarthria HEP at least 1x/day over 1 week period with mod-I ?Baseline:  ?Goal status: INITIAL ? ?LONG TERM GOALS: Target date: 06/28/2022   ? ?Pt will demonstrate average WNL rate of speech over 18+ minute conversational sample with no more than 4 rushes of speech over sample period, with rare min-A.  ?Baseline:  ?Goal status: INITIAL ? ?2.  Pt will ID and self-correct rushes in rate of speech in 70% of occurences in structured speech practice (e.g. oral reading) over 2 sessions with rare min-A.  ?Baseline:  ?Goal status: INITIAL ? ?3.  Pt will report improvement via CPIB PROM by 4 points by d/c, indicative subjective improvement in communicative abilities.  ?Baseline:  ?Goal status: INITIAL ? ?ASSESSMENT: ? ?CLINICAL IMPRESSION: ?Patient is a 61 y.o. M who was seen today for dysarthria 2/2 Parkinson's. Pt presents with volume within gross normal limits, across 20 minute conversational sample. Intelligibility impacted primarily by rate of speech. Rate is variable, ranging from normal to frequent rushes. During rushes, also demonstrating initial part word repetitions. Tells ST he always spoke fast but since DBT, has increased and is negative impacting ability to communicate. Is avoiding communication, as his speech is "giving me anxiety." I recommend skilled ST to address motor speech to increase intelligibility and pt's ability to effectively communicate.  ? ?OBJECTIVE IMPAIRMENTS  ?Objective impairments include dysarthria. These impairments are limiting patient from effectively communicating at home and in community.Factors affecting potential to achieve goals and functional outcome are medical prognosis.. Patient will benefit from skilled SLP services to address above impairments and improve overall function. ? ?REHAB POTENTIAL: Good ? ?PLAN: ?SLP FREQUENCY: 2x/week ? ?SLP  DURATION: 8  weeks ? ?PLANNED INTERVENTIONS: SLP instruction and feedback, Compensatory strategies, and Patient/family education ? ? ? ?Maia Breslow, CCC-SLP ?05/03/2022, 7:44 AM ? ? ? ?  ?

## 2022-05-12 NOTE — Therapy (Signed)
OUTPATIENT OCCUPATIONAL THERAPY PARKINSON'S TREATMENT  Patient Name: Ian [name] MRN: [id number] DOB:02/23/1961, 61 y.o., male Today's Date: 05/17/2022  PCP: Dr. Tracey Harries REFERRING PROVIDER: Dr. Lurena Joiner [name]   OT End of Session - 05/17/22 0854     Visit Number 2    Number of Visits 13    Date for OT Re-Evaluation 07/02/22    Authorization Type Medicare & Tricare for Life--Covered 100%    Authorization - Visit Number 2    Authorization - Number of Visits 10    Progress Note Due on Visit 10    OT Start Time 0849    OT Stop Time 0930    OT Time Calculation (min) 41 min    Activity Tolerance Patient tolerated treatment well    Behavior During Therapy [facility] for tasks assessed/performed              Past Medical History:  Diagnosis Date   Anxiety    History of kidney stones    passed   PD (Parkinson's disease) (HCC) 09/10/2013   PONV (postoperative nausea and vomiting)    nausea    Past Surgical History:  Procedure Laterality Date   COLONOSCOPY     MINOR PLACEMENT OF FIDUCIAL N/A 01/31/2017   Procedure: MINOR PLACEMENT OF FIDUCIAL;  Surgeon: Maeola Harman, MD;  Location: [facility] OR;  Service: Neurosurgery;  Laterality: N/A;  Fiducial placement   none     PULSE GENERATOR IMPLANT Right 02/17/2017   Procedure: Right implantable pulse generator placement;  Surgeon: Maeola Harman, MD;  Location: [facility] OR;  Service: Neurosurgery;  Laterality: Right;  Bilateral implantable pulse generator placement   SUBTHALAMIC STIMULATOR BATTERY REPLACEMENT N/A 10/19/2018   Procedure: Deep Brain stimulator implantable pulse generator revision;  Surgeon: Maeola Harman, MD;  Location: [facility] OR;  Service: Neurosurgery;  Laterality: N/A;  Deep Brain stimulator implantable pulse generator revision   SUBTHALAMIC STIMULATOR INSERTION Bilateral 02/10/2017   Procedure: Bilateral Deep brain stimulator placement;  Surgeon: Maeola Harman, MD;  Location: [facility] OR;  Service: Neurosurgery;  Laterality: Bilateral;  Bilateral  Deep brain stimulator placement   UPPER GASTROINTESTINAL ENDOSCOPY     Patient Active Problem List   Diagnosis Date Noted   Closed fracture of phalanx of left fifth toe 10/19/2021   Tinea versicolor 05/28/2021   Left rotator cuff tear 08/20/2020   Nonallopathic lesion of sacral region 07/25/2018   Nonallopathic lesion of lumbosacral region 07/25/2018   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   Acute bursitis of right shoulder 12/06/2017   Antalgic gait 01/24/2017   Intercostal muscle tear 07/19/2016   PD (Parkinson's disease) (HCC) 06/24/2016   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   Nonallopathic lesion of thoracic region 01/25/2016   Nonallopathic lesion-rib cage 01/25/2016   Nonallopathic lesion of cervical region 01/25/2016   Parkinson's disease (HCC) 01/25/2016   Lateral epicondylitis 12/01/2014   REM behavioral disorder 08/27/2014   Paralysis agitans (HCC) 10/18/2013    ONSET DATE: PD screen date 04/21/22  REFERRING DIAG:  G20 (ICD-10-CM) - Parkinson's disease (HCC)   THERAPY DIAG:  Other symptoms and signs involving the nervous system  Other lack of coordination  Abnormal posture  Unsteadiness on feet  Other symptoms and signs involving the musculoskeletal system  SUBJECTIVE:   SUBJECTIVE STATEMENT: Pt reports that wedding went well   Pt accompanied by: self  PERTINENT HISTORY:  bilateral STN DBS 01/2017, hx of partial tear of L rotator cuff, hx of bilateral shoulder impingement per pt, hx of slipped rib syndrome, anxiety  PRECAUTIONS: bilateral  STN DBS 01/2017  WEIGHT BEARING RESTRICTIONS No  PAIN:  Are you having pain? No  FALLS: Has patient fallen in last 6 months? No  LIVING ENVIRONMENT: Lives with: lives with their spouse  PLOF: Independent, and Leisure: golf, goes to [employer], cycling, and lifts weights  PATIENT GOALS  improve writing, improve golf (ball on tee, putting--speed), improve putting on necklace, cutting food, taking out  contact  OBJECTIVE:    TODAY'S TREATMENT:   Pt instructed in coordination HEP.   PATIENT EDUCATION: Education details: Coordination HEP with focus on timing and large amplitude--see pt instructions  Person educated: Patient Education method: Explanation Education comprehension: verbalized understanding   HOME EXERCISE PROGRAM: Not yet issued  ASSESSMENT:  CLINICAL IMPRESSION: Pt is progressing towards goals and responds well to cueing for timing and pauses between movements.  Will continue to reinforce for incr carryover to functional tasks.   PERFORMANCE DEFICITS in functional skills including ADLs, IADLs, coordination, dexterity, tone, ROM, FMC, GMC, balance, and UE functional use and psychosocial skills including habits.   IMPAIRMENTS are limiting patient from ADLs, IADLs, and leisure.   COMORBIDITIES may have co-morbidities  that affects occupational performance. Patient will benefit from skilled OT to address above impairments and improve overall function.  MODIFICATION OR ASSISTANCE TO COMPLETE EVALUATION: Min-Moderate modification of tasks or assist with assess necessary to complete an evaluation.  OT OCCUPATIONAL PROFILE AND HISTORY: Detailed assessment: Review of records and additional review of physical, cognitive, psychosocial history related to current functional performance.  CLINICAL DECISION MAKING: Moderate - several treatment options, min-mod task modification necessary  REHAB POTENTIAL: Good  EVALUATION COMPLEXITY: Moderate     GOALS: Potential Goals reviewed with patient? Yes  SHORT TERM GOALS: Target date: 05/31/2022    Pt will be independent with updated PD-specific HEP with emphasis on timing of movement and large amplitude. Goal status: INITIAL  2.  Pt will verbalize understanding of ways to decr risk of future complications related to PD. Goal status: INITIAL    LONG TERM GOALS: Target date: 06/14/2022    Pt will verbalize understanding  of adaptive strategies to incr ease with ADLs/IADLs and decr risk of future complications (including putting on necklace, writing, cutting food, golf, putting contacts in/out, and improved timing of movement). Goal status: INITIAL  2.  Pt will be able to write at least 4 sentences/lines with 100% legibility and no significant decr in size. Goal status: INITIAL  3.  Pt will improve bilateral coordination and functional reaching for ADLs/IADLs as shown by improving score on box and blocks test by 4. Baseline: R-54, L-53 blocks Goal status: INITIAL  4.  Pt will reports putting golf ball on tee and putting golf ball as only somewhat difficult (3/7). Goal status: INITIAL   PLAN: OT FREQUENCY: 2x/week  OT DURATION: 6 weeks or 12 visits over 8 wks due to scheduling; however, anticipate that pt may only need 9 visits  PLANNED INTERVENTIONS: self care/ADL training, therapeutic exercise, therapeutic activity, neuromuscular re-education, manual therapy, passive range of motion, balance training, functional mobility training, moist heat, patient/family education, energy conservation, coping strategies training, and DME and/or AE instructions  RECOMMENDED OTHER SERVICES: speech therapy--eval today  CONSULTED AND AGREED WITH PLAN OF CARE: Patient  PLAN FOR NEXT SESSION:  review and issue [employer] Hands (basic 4), practice writing and functional reach with coordination with emphasis on timing and stop/start of each movement   Vern[facility]. Geddy Jr. Outpatient CenterFREEMAN,Mahira Petras, OT 05/17/2022, 8:54 AM  Willa FraterAngela Zareen Jamison, OTR/L [facility] [address]  [address] [place], [place]  [address] [phone] phone [phone] 05/17/22 8:54 AM

## 2022-05-17 ENCOUNTER — Encounter: Payer: Self-pay | Admitting: Occupational Therapy

## 2022-05-17 ENCOUNTER — Ambulatory Visit: Payer: Medicare Other | Admitting: Occupational Therapy

## 2022-05-17 DIAGNOSIS — R293 Abnormal posture: Secondary | ICD-10-CM

## 2022-05-17 DIAGNOSIS — R2681 Unsteadiness on feet: Secondary | ICD-10-CM

## 2022-05-17 DIAGNOSIS — R29818 Other symptoms and signs involving the nervous system: Secondary | ICD-10-CM

## 2022-05-17 DIAGNOSIS — R29898 Other symptoms and signs involving the musculoskeletal system: Secondary | ICD-10-CM

## 2022-05-17 DIAGNOSIS — R278 Other lack of coordination: Secondary | ICD-10-CM

## 2022-05-17 NOTE — Patient Instructions (Addendum)
   Coordination Exercises  Perform the following exercises for 20 minutes 1 times per day. Perform with both hand(s). Perform using big movements.  Flipping Cards: Place deck of cards on the table. Flip cards over by opening your hand big to grasp and then turn your palm up big, opening hand fully to release. Deal cards: Hold 1/2 or whole deck in your hand. Use thumb to push card off top of deck with one big push. Flip card between each finger. Place card on tabletop.  Then flick fingers (extend fingers) powerfully to slide card off table (can have chair/box below table to catch the cards). Rotate ball with fingertips: Pick up with fingers/thumb and move as much as you can with each turn/movement (clockwise and counter-clockwise). Toss ball from one hand to the other: Toss big/high.  Deliberately open with toss and deliberately close hand after catch. Toss ball in the air and catch with the same hand: Toss big/high.  Deliberately open with toss and deliberately close hand after catch. Juggle 2 balls: Do not go fast. Pause after each toss. Rotate 2 golf balls in your hand: Both directions. Pick up coins and place in coin bank or container: Open hand big and pick up with big, intentional movements. Do not drag coin to the edge. Pick up coins and stack one at a time: Open hand big and pick up with big, intentional movements. Do not drag coin to the edge. (5-10 in a stack) Pick up 5-10 coins one at a time and hold in palm. Then, move coins from palm to fingertips one at time and place in coin bank/container. Practice writing: Slow down, write big, and focus on forming each letter. Practice typing using 2 hands.

## 2022-05-23 ENCOUNTER — Ambulatory Visit: Payer: Medicare Other | Admitting: Speech Pathology

## 2022-05-23 ENCOUNTER — Ambulatory Visit: Payer: Medicare Other | Attending: Family Medicine | Admitting: Occupational Therapy

## 2022-05-23 DIAGNOSIS — R471 Dysarthria and anarthria: Secondary | ICD-10-CM | POA: Insufficient documentation

## 2022-05-23 DIAGNOSIS — R278 Other lack of coordination: Secondary | ICD-10-CM | POA: Diagnosis present

## 2022-05-23 DIAGNOSIS — R29818 Other symptoms and signs involving the nervous system: Secondary | ICD-10-CM | POA: Insufficient documentation

## 2022-05-23 DIAGNOSIS — R29898 Other symptoms and signs involving the musculoskeletal system: Secondary | ICD-10-CM | POA: Insufficient documentation

## 2022-05-23 DIAGNOSIS — R293 Abnormal posture: Secondary | ICD-10-CM | POA: Diagnosis present

## 2022-05-23 DIAGNOSIS — R2681 Unsteadiness on feet: Secondary | ICD-10-CM | POA: Insufficient documentation

## 2022-05-23 NOTE — Therapy (Signed)
OUTPATIENT OCCUPATIONAL THERAPY PARKINSON'S TREATMENT  Patient Name: Ian [name] MRN: [id number] DOB:1961/09/27, 61 y.o., male Today's Date: 05/23/2022  PCP: Dr. Tracey Harries REFERRING PROVIDER: Dr. Lurena Joiner [name]   OT End of Session - 05/23/22 1302     Visit Number 3    Number of Visits 13    Date for OT Re-Evaluation 07/02/22    Authorization Type Medicare & Tricare for Life--Covered 100%    Authorization - Visit Number 3    Authorization - Number of Visits 10    Progress Note Due on Visit 10    OT Start Time 1147    OT Stop Time 1229    OT Time Calculation (min) 42 min    Activity Tolerance Patient tolerated treatment well    Behavior During Therapy [facility] for tasks assessed/performed               Past Medical History:  Diagnosis Date   Anxiety    History of kidney stones    passed   PD (Parkinson's disease) (HCC) 09/10/2013   PONV (postoperative nausea and vomiting)    nausea    Past Surgical History:  Procedure Laterality Date   COLONOSCOPY     MINOR PLACEMENT OF FIDUCIAL N/A 01/31/2017   Procedure: MINOR PLACEMENT OF FIDUCIAL;  Surgeon: Maeola Harman, MD;  Location: [facility] OR;  Service: Neurosurgery;  Laterality: N/A;  Fiducial placement   none     PULSE GENERATOR IMPLANT Right 02/17/2017   Procedure: Right implantable pulse generator placement;  Surgeon: Maeola Harman, MD;  Location: [facility] OR;  Service: Neurosurgery;  Laterality: Right;  Bilateral implantable pulse generator placement   SUBTHALAMIC STIMULATOR BATTERY REPLACEMENT N/A 10/19/2018   Procedure: Deep Brain stimulator implantable pulse generator revision;  Surgeon: Maeola Harman, MD;  Location: [facility] OR;  Service: Neurosurgery;  Laterality: N/A;  Deep Brain stimulator implantable pulse generator revision   SUBTHALAMIC STIMULATOR INSERTION Bilateral 02/10/2017   Procedure: Bilateral Deep brain stimulator placement;  Surgeon: Maeola Harman, MD;  Location: [facility] OR;  Service: Neurosurgery;  Laterality: Bilateral;  Bilateral  Deep brain stimulator placement   UPPER GASTROINTESTINAL ENDOSCOPY     Patient Active Problem List   Diagnosis Date Noted   Closed fracture of phalanx of left fifth toe 10/19/2021   Tinea versicolor 05/28/2021   Left rotator cuff tear 08/20/2020   Nonallopathic lesion of sacral region 07/25/2018   Nonallopathic lesion of lumbosacral region 07/25/2018   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   Acute bursitis of right shoulder 12/06/2017   Antalgic gait 01/24/2017   Intercostal muscle tear 07/19/2016   PD (Parkinson's disease) (HCC) 06/24/2016   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   Nonallopathic lesion of thoracic region 01/25/2016   Nonallopathic lesion-rib cage 01/25/2016   Nonallopathic lesion of cervical region 01/25/2016   Parkinson's disease (HCC) 01/25/2016   Lateral epicondylitis 12/01/2014   REM behavioral disorder 08/27/2014   Paralysis agitans (HCC) 10/18/2013    ONSET DATE: PD screen date 04/21/22  REFERRING DIAG:  G20 (ICD-10-CM) - Parkinson's disease (HCC)   THERAPY DIAG:  Other symptoms and signs involving the nervous system  Other lack of coordination  Abnormal posture  Unsteadiness on feet  Other symptoms and signs involving the musculoskeletal system  SUBJECTIVE:   SUBJECTIVE STATEMENT: Pt reports that he had a busy day yesterday  Pt accompanied by: self  PERTINENT HISTORY:  bilateral STN DBS 01/2017, hx of partial tear of L rotator cuff, hx of bilateral shoulder impingement per pt, hx of slipped rib syndrome, anxiety  PRECAUTIONS: bilateral STN DBS 01/2017  WEIGHT BEARING RESTRICTIONS No  PAIN:  Are you having pain? No  FALLS: Has patient fallen in last 6 months? No  LIVING ENVIRONMENT: Lives with: lives with their spouse  PLOF: Independent, and Leisure: golf, goes to [employer] Moves, cycling, and lifts weights  PATIENT GOALS  improve writing, improve golf (ball on tee, putting--speed), improve putting on necklace, cutting food, taking out  contact  OBJECTIVE:    TODAY'S TREATMENT:   Reviewed [employer]! Hands basic 4, min v.c for timing and amplitude, pt reports he still has handout at home Handwriting strategies with emphasis on timing, cues for pausing between each letter and word, graph paper was used as a tool and pt demonstrates improved printing following use.  Handwriting initially was small but improved in legibility and letter size with practice and cueing. Pt was encouraged to practice writing at home. Pt practiced cutting food with big movements with good techniques, min v.c Stacking and unstacking blocks with bilateral hands, emphasis on timing, big movements, min v.c Tossing scarves to targets then ambulating tossing scarf out of each hand, min v.c for amplitude and timing Flipping, dealing and flicking cards with big movements, bilateral UE;'s min v.c    PATIENT EDUCATION: Education details: PWR! Hands basic 4 and handwriting with focus on timing and large amplitude--see pt instructions  Person educated: Patient Education method: Explanation, demonstration, v.c  Education comprehension: verbalized understanding, returned demo   HOME EXERCISE PROGRAM: Not yet issued  ASSESSMENT:  CLINICAL IMPRESSION: Pt is progressing towards goals and responds well to cueing for timing and pauses between movements.  Pt demonstrates improved hand writing with practice, and cues.  PERFORMANCE DEFICITS in functional skills including ADLs, IADLs, coordination, dexterity, tone, ROM, FMC, GMC, balance, and UE functional use and psychosocial skills including habits.   IMPAIRMENTS are limiting patient from ADLs, IADLs, and leisure.   COMORBIDITIES may have co-morbidities  that affects occupational performance. Patient will benefit from skilled OT to address above impairments and improve overall function.  MODIFICATION OR ASSISTANCE TO COMPLETE EVALUATION: Min-Moderate modification of tasks or assist with assess necessary to complete  an evaluation.  OT OCCUPATIONAL PROFILE AND HISTORY: Detailed assessment: Review of records and additional review of physical, cognitive, psychosocial history related to current functional performance.  CLINICAL DECISION MAKING: Moderate - several treatment options, min-mod task modification necessary  REHAB POTENTIAL: Good  EVALUATION COMPLEXITY: Moderate     GOALS: Potential Goals reviewed with patient? Yes  SHORT TERM GOALS: Target date: 05/31/2022    Pt will be independent with updated PD-specific HEP with emphasis on timing of movement and large amplitude. Goal status: INITIAL  2.  Pt will verbalize understanding of ways to decr risk of future complications related to PD. Goal status: INITIAL    LONG TERM GOALS: Target date: 06/14/2022    Pt will verbalize understanding of adaptive strategies to incr ease with ADLs/IADLs and decr risk of future complications (including putting on necklace, writing, cutting food, golf, putting contacts in/out, and improved timing of movement). Goal status: INITIAL  2.  Pt will be able to write at least 4 sentences/lines with 100% legibility and no significant decr in size. Goal status: INITIAL  3.  Pt will improve bilateral coordination and functional reaching for ADLs/IADLs as shown by improving score on box and blocks test by 4. Baseline: R-54, L-53 blocks Goal status: INITIAL  4.  Pt will reports putting golf ball on tee and putting golf ball as only somewhat difficult (  3/7). Goal status: INITIAL   PLAN: OT FREQUENCY: 2x/week  OT DURATION: 6 weeks or 12 visits over 8 wks due to scheduling; however, anticipate that pt may only need 9 visits  PLANNED INTERVENTIONS: self care/ADL training, therapeutic exercise, therapeutic activity, neuromuscular re-education, manual therapy, passive range of motion, balance training, functional mobility training, moist heat, patient/family education, energy conservation, coping strategies training,  and DME and/or AE instructions  RECOMMENDED OTHER SERVICES: speech therapy--eval today  CONSULTED AND AGREED WITH PLAN OF CARE: Patient  PLAN FOR NEXT SESSION:  functional reach with coordination with emphasis on timing and stop/start of each movement   Kayle Correa, OT 05/23/2022, 1:03 PM  Willa Frater, OTR/L [facility] [address] [place], [place]  [address] [phone] phone [phone] 05/23/22 1:03 PM

## 2022-05-25 ENCOUNTER — Ambulatory Visit: Payer: Medicare Other

## 2022-05-25 ENCOUNTER — Ambulatory Visit: Payer: Medicare Other | Admitting: Occupational Therapy

## 2022-05-25 DIAGNOSIS — R29818 Other symptoms and signs involving the nervous system: Secondary | ICD-10-CM | POA: Diagnosis not present

## 2022-05-25 DIAGNOSIS — R471 Dysarthria and anarthria: Secondary | ICD-10-CM

## 2022-05-25 DIAGNOSIS — R2681 Unsteadiness on feet: Secondary | ICD-10-CM

## 2022-05-25 DIAGNOSIS — R278 Other lack of coordination: Secondary | ICD-10-CM

## 2022-05-25 DIAGNOSIS — R293 Abnormal posture: Secondary | ICD-10-CM

## 2022-05-25 DIAGNOSIS — R29898 Other symptoms and signs involving the musculoskeletal system: Secondary | ICD-10-CM

## 2022-05-25 NOTE — Therapy (Signed)
OUTPATIENT OCCUPATIONAL THERAPY PARKINSON'S TREATMENT  Patient Name: Ian [name] MRN: [id number] DOB:11-Feb-1961, 61 y.o., male Today's Date: 05/25/2022  PCP: Dr. Tracey Harries REFERRING PROVIDER: Dr. Lurena Joiner [name]   OT End of Session - 05/25/22 1015     Visit Number 4    Number of Visits 13    Date for OT Re-Evaluation 07/02/22    Authorization Type Medicare & Tricare for Life--Covered 100%    Authorization - Visit Number 4    Authorization - Number of Visits 10    Progress Note Due on Visit 10    Activity Tolerance Patient tolerated treatment well    Behavior During Therapy [facility] for tasks assessed/performed               Past Medical History:  Diagnosis Date   Anxiety    History of kidney stones    passed   PD (Parkinson's disease) (HCC) 09/10/2013   PONV (postoperative nausea and vomiting)    nausea    Past Surgical History:  Procedure Laterality Date   COLONOSCOPY     MINOR PLACEMENT OF FIDUCIAL N/A 01/31/2017   Procedure: MINOR PLACEMENT OF FIDUCIAL;  Surgeon: Maeola Harman, MD;  Location: [facility] OR;  Service: Neurosurgery;  Laterality: N/A;  Fiducial placement   none     PULSE GENERATOR IMPLANT Right 02/17/2017   Procedure: Right implantable pulse generator placement;  Surgeon: Maeola Harman, MD;  Location: [facility] OR;  Service: Neurosurgery;  Laterality: Right;  Bilateral implantable pulse generator placement   SUBTHALAMIC STIMULATOR BATTERY REPLACEMENT N/A 10/19/2018   Procedure: Deep Brain stimulator implantable pulse generator revision;  Surgeon: Maeola Harman, MD;  Location: [facility] OR;  Service: Neurosurgery;  Laterality: N/A;  Deep Brain stimulator implantable pulse generator revision   SUBTHALAMIC STIMULATOR INSERTION Bilateral 02/10/2017   Procedure: Bilateral Deep brain stimulator placement;  Surgeon: Maeola Harman, MD;  Location: [facility] OR;  Service: Neurosurgery;  Laterality: Bilateral;  Bilateral Deep brain stimulator placement   UPPER GASTROINTESTINAL ENDOSCOPY     Patient  Active Problem List   Diagnosis Date Noted   Closed fracture of phalanx of left fifth toe 10/19/2021   Tinea versicolor 05/28/2021   Left rotator cuff tear 08/20/2020   Nonallopathic lesion of sacral region 07/25/2018   Nonallopathic lesion of lumbosacral region 07/25/2018   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   Acute bursitis of right shoulder 12/06/2017   Antalgic gait 01/24/2017   Intercostal muscle tear 07/19/2016   PD (Parkinson's disease) (HCC) 06/24/2016   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   Nonallopathic lesion of thoracic region 01/25/2016   Nonallopathic lesion-rib cage 01/25/2016   Nonallopathic lesion of cervical region 01/25/2016   Parkinson's disease (HCC) 01/25/2016   Lateral epicondylitis 12/01/2014   REM behavioral disorder 08/27/2014   Paralysis agitans (HCC) 10/18/2013    ONSET DATE: PD screen date 04/21/22  REFERRING DIAG:  G20 (ICD-10-CM) - Parkinson's disease (HCC)   THERAPY DIAG:  Other symptoms and signs involving the nervous system  Other lack of coordination  Abnormal posture  Unsteadiness on feet  Other symptoms and signs involving the musculoskeletal system  SUBJECTIVE:   SUBJECTIVE STATEMENT: Pt reports that he had a busy day yesterday  Pt accompanied by: self  PERTINENT HISTORY:  bilateral STN DBS 01/2017, hx of partial tear of L rotator cuff, hx of bilateral shoulder impingement per pt, hx of slipped rib syndrome, anxiety  PRECAUTIONS: bilateral STN DBS 01/2017  WEIGHT BEARING RESTRICTIONS No  PAIN:  Are you having pain? No  FALLS: Has patient  fallen in last 6 months? No  LIVING ENVIRONMENT: Lives with: lives with their spouse  PLOF: Independent, and Leisure: golf, goes to [employer] Moves, cycling, and lifts weights  PATIENT GOALS  improve writing, improve golf (ball on tee, putting--speed), improve putting on necklace, cutting food, taking out contact  OBJECTIVE:    TODAY'S TREATMENT:   Reviewed PWR! Hands basic 4,  modified quadraped min v.c for timing and amplitude. Pt was cues to pause in between exercises, he performed 3 foot taps  and stretch with each PWR! Step. Handwriting strategies, starting with printing the alphabet initially with emphasis on timing, cues for pausing between each letter. Then pt wrote sentences using the big movement strategies with good success., letter size and legibility. and word,   Simulated placing golf tees into pegboard on floor, pause between each one, and flicks min v.c  Putting golf balls with min v.c to pause swing and slow down for improved timing. Step and reach Flipping large playing cards , min v.c for timing Placing small pegs into vertical pegboard with left and right UE's min v.c  for timing   PATIENT EDUCATION: Education details: PWR! Hands basic 4 quadraped, focus on timing and large amplitude Person educated: Patient Education method: Explanation, demonstration, v.c  Education comprehension: verbalized understanding, returned demo   HOME EXERCISE PROGRAM: 05/23/22-PWR! Hands basic 4 review, pt already has handout 05/25/22- PWR! Quadraped basic 4 review     GOALS: Potential Goals reviewed with patient? Yes  SHORT TERM GOALS: Target date: 05/31/2022    Pt will be independent with updated PD-specific HEP with emphasis on timing of movement and large amplitude. Goal status: INITIAL  2.  Pt will verbalize understanding of ways to decr risk of future complications related to PD. Goal status: INITIAL    LONG TERM GOALS: Target date: 06/14/2022    Pt will verbalize understanding of adaptive strategies to incr ease with ADLs/IADLs and decr risk of future complications (including putting on necklace, writing, cutting food, golf, putting contacts in/out, and improved timing of movement). Goal status: INITIAL  2.  Pt will be able to write at least 4 sentences/lines with 100% legibility and no significant decr in size. Goal status: INITIAL  3.  Pt will  improve bilateral coordination and functional reaching for ADLs/IADLs as shown by improving score on box and blocks test by 4. Baseline: R-54, L-53 blocks Goal status: INITIAL  4.  Pt will reports putting golf ball on tee and putting golf ball as only somewhat difficult (3/7). Goal status: INITIAL   ASSESSMENT:  CLINICAL IMPRESSION: Pt is progressing towards goals and responds well to cueing for timing and pauses between movements.  Pt demonstrates improved hand writing with practice, and cues.  PERFORMANCE DEFICITS in functional skills including ADLs, IADLs, coordination, dexterity, tone, ROM, FMC, GMC, balance, and UE functional use and psychosocial skills including habits.   IMPAIRMENTS are limiting patient from ADLs, IADLs, and leisure.   COMORBIDITIES may have co-morbidities  that affects occupational performance. Patient will benefit from skilled OT to address above impairments and improve overall function.  MODIFICATION OR ASSISTANCE TO COMPLETE EVALUATION: Min-Moderate modification of tasks or assist with assess necessary to complete an evaluation.  OT OCCUPATIONAL PROFILE AND HISTORY: Detailed assessment: Review of records and additional review of physical, cognitive, psychosocial history related to current functional performance.  CLINICAL DECISION MAKING: Moderate - several treatment options, min-mod task modification necessary  REHAB POTENTIAL: Good  EVALUATION COMPLEXITY: Moderate       PLAN:  OT FREQUENCY: 2x/week  OT DURATION: 6 weeks or 12 visits over 8 wks due to scheduling; however, anticipate that pt may only need 9 visits  PLANNED INTERVENTIONS: self care/ADL training, therapeutic exercise, therapeutic activity, neuromuscular re-education, manual therapy, passive range of motion, balance training, functional mobility training, moist heat, patient/family education, energy conservation, coping strategies training, and DME and/or AE instructions  RECOMMENDED  OTHER SERVICES: speech therapy--eval today  CONSULTED AND AGREED WITH PLAN OF CARE: Patient  PLAN FOR NEXT SESSION:  ADL strategies, functional reach with coordination with emphasis on timing and stop/start of each movement   Solomia Harrell, OT 05/25/2022, 10:15 AM   [facility] [address] [place], [place]  [address] [phone] phone [phone] 05/25/22 10:15 AM

## 2022-05-25 NOTE — Therapy (Signed)
OUTPATIENT SPEECH LANGUAGE PATHOLOGY TREATMENT NOTE   Patient Name: Ian [name] MRN: [id number] DOB:1961-07-15, 61 y.o., male Today's Date: 05/25/2022  PCP: Tracey Harries, MD REFERRING PROVIDER: Vladimir Faster, DO  END OF SESSION:   End of Session - 05/25/22 0933     Visit Number 2    Number of Visits 17    Date for SLP Re-Evaluation 06/28/22    Authorization Type Medicare/Tricare    SLP Start Time 0933    SLP Stop Time  1015    SLP Time Calculation (min) 42 min    Activity Tolerance Patient tolerated treatment well             Past Medical History:  Diagnosis Date   Anxiety    History of kidney stones    passed   PD (Parkinson's disease) (HCC) 09/10/2013   PONV (postoperative nausea and vomiting)    nausea    Past Surgical History:  Procedure Laterality Date   COLONOSCOPY     MINOR PLACEMENT OF FIDUCIAL N/A 01/31/2017   Procedure: MINOR PLACEMENT OF FIDUCIAL;  Surgeon: Maeola Harman, MD;  Location: [facility] OR;  Service: Neurosurgery;  Laterality: N/A;  Fiducial placement   none     PULSE GENERATOR IMPLANT Right 02/17/2017   Procedure: Right implantable pulse generator placement;  Surgeon: Maeola Harman, MD;  Location: [facility] OR;  Service: Neurosurgery;  Laterality: Right;  Bilateral implantable pulse generator placement   SUBTHALAMIC STIMULATOR BATTERY REPLACEMENT N/A 10/19/2018   Procedure: Deep Brain stimulator implantable pulse generator revision;  Surgeon: Maeola Harman, MD;  Location: [facility] OR;  Service: Neurosurgery;  Laterality: N/A;  Deep Brain stimulator implantable pulse generator revision   SUBTHALAMIC STIMULATOR INSERTION Bilateral 02/10/2017   Procedure: Bilateral Deep brain stimulator placement;  Surgeon: Maeola Harman, MD;  Location: [facility] OR;  Service: Neurosurgery;  Laterality: Bilateral;  Bilateral Deep brain stimulator placement   UPPER GASTROINTESTINAL ENDOSCOPY     Patient Active Problem List   Diagnosis Date Noted   Closed fracture of phalanx of left fifth toe  10/19/2021   Tinea versicolor 05/28/2021   Left rotator cuff tear 08/20/2020   Nonallopathic lesion of sacral region 07/25/2018   Nonallopathic lesion of lumbosacral region 07/25/2018   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   Acute bursitis of right shoulder 12/06/2017   Antalgic gait 01/24/2017   Intercostal muscle tear 07/19/2016   PD (Parkinson's disease) (HCC) 06/24/2016   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 No  OBJECTIVE:   TODAY'S TREATMENT:  05-25-22: Pt returned with Speak Out! Workbook. Educated principles of program and plan to target reducing rate of speech via intentional practice. Targeted improving vocal quality, maintaining intensity, and reducing rate through progressively difficulty speech tasks using Speak Out! program, lesson #1. ST leads pt through exercises providing usual model prior to pt execution. Rare min-A required to achieve target dB this date. Averages this date: loud "ah" 90 dB; reading 74 dB; cognitive speech task 75  dB. Rare min A required  this date to slow rate during structured practice. Conversational sample of approx 10 minutes, pt averages WNL volume with occasional min-A to be intentional to reduce rate of speech.  05-03-22: Education on SLP observations, evaluation results, recommendations for skilled ST. Instruction on using intentional speech to control rate, resulting  in increased intelligibility. Oral reading practice with usual mod-A for rate.      PATIENT EDUCATION: Education details: see above Person educated: Patient Education method: Solicitor, Verbal cues, and Handouts Education comprehension: verbalized understanding, returned demonstration, verbal cues required, and needs further education     HOME EXERCISE PROGRAM: Order Speak Out! workbook         GOALS: Goals reviewed with patient? Yes   SHORT TERM GOALS: Target date: 05/31/2022     Pt will maintain WNL rate of speech during structured practice (oral reading, cognitive exercise, etc.), multi-sentence level, with occasional min-A over 2 sessions.  Baseline: 05-25-22 Goal status: ongoing   2.  Pt will self-ID rushes in speech, as they occur, in 50% of opportunities to increase ability to self-correct rate with occasional min-A over 2 sessions.  Baseline:  Goal status: ongoing   3.  Pt will report completion of dysarthria HEP at least 1x/day over 1 week period with mod-I Baseline:  Goal status: ongoing   LONG TERM GOALS: Target date: 06/28/2022     Pt will demonstrate average WNL rate of speech over 18+ minute conversational sample with no more than 4 rushes of speech over sample period, with rare min-A.  Baseline:  Goal status: ongoing   2.  Pt will ID and self-correct rushes in rate of speech in 70% of occurences in structured speech practice (e.g. oral reading) over 2 sessions with rare min-A.  Baseline:  Goal status: ongoing   3.  Pt will report improvement via CPIB PROM by 4 points by d/c, indicative subjective improvement in communicative abilities.  Baseline:  Goal status: ongoing   ASSESSMENT:   CLINICAL IMPRESSION: Patient is a 61 y.o. M who was seen today for dysarthria 2/2 Parkinson's. Pt presents with volume within gross normal limits, across 20 minute conversational sample. Intelligibility impacted primarily by rate of speech. Rate is variable,  ranging from normal to frequent rushes. During rushes, also demonstrating initial part word repetitions. Tells ST he always spoke fast but since DBT, has increased and is negative impacting ability to communicate. Is avoiding communication, as his speech is "giving me anxiety." Initiated education and training of Speak Out! Program to be intentional about reducing speech rate. I recommend skilled ST to address motor speech to increase intelligibility and pt's ability to effectively communicate.    OBJECTIVE IMPAIRMENTS  Objective impairments include dysarthria. These impairments are limiting patient from effectively communicating at home and in community.Factors affecting potential to achieve goals and functional outcome are medical prognosis.. Patient will benefit from skilled SLP services to address above impairments and improve overall function.   REHAB POTENTIAL: Good   PLAN: SLP FREQUENCY: 2x/week   SLP DURATION: 8 weeks   PLANNED INTERVENTIONS: SLP instruction and feedback, Compensatory strategies, and Patient/family education   Gracy Racer, CCC-SLP 05/25/2022, 9:33 AM

## 2022-05-26 NOTE — Therapy (Signed)
OUTPATIENT OCCUPATIONAL THERAPY PARKINSON'S TREATMENT  Patient Name: Ian [name] MRN: [id number] DOB:[birth year], 61 y.o., male Today's Date: 05/26/2022  PCP: Dr. Tracey Harries REFERRING PROVIDER: Dr. Lurena Joiner [name]       Past Medical History:  Diagnosis Date   Anxiety    History of kidney stones    passed   PD (Parkinson's disease) (HCC) 09/10/2013   PONV (postoperative nausea and vomiting)    nausea    Past Surgical History:  Procedure Laterality Date   COLONOSCOPY     MINOR PLACEMENT OF FIDUCIAL N/A 01/31/2017   Procedure: MINOR PLACEMENT OF FIDUCIAL;  Surgeon: Maeola Harman, MD;  Location: [facility] OR;  Service: Neurosurgery;  Laterality: N/A;  Fiducial placement   none     PULSE GENERATOR IMPLANT Right 02/17/2017   Procedure: Right implantable pulse generator placement;  Surgeon: Maeola Harman, MD;  Location: [facility] OR;  Service: Neurosurgery;  Laterality: Right;  Bilateral implantable pulse generator placement   SUBTHALAMIC STIMULATOR BATTERY REPLACEMENT N/A 10/19/2018   Procedure: Deep Brain stimulator implantable pulse generator revision;  Surgeon: Maeola Harman, MD;  Location: [facility] OR;  Service: Neurosurgery;  Laterality: N/A;  Deep Brain stimulator implantable pulse generator revision   SUBTHALAMIC STIMULATOR INSERTION Bilateral 02/10/2017   Procedure: Bilateral Deep brain stimulator placement;  Surgeon: Maeola Harman, MD;  Location: [facility] OR;  Service: Neurosurgery;  Laterality: Bilateral;  Bilateral Deep brain stimulator placement   UPPER GASTROINTESTINAL ENDOSCOPY     Patient Active Problem List   Diagnosis Date Noted   Closed fracture of phalanx of left fifth toe 10/19/2021   Tinea versicolor 05/28/2021   Left rotator cuff tear 08/20/2020   Nonallopathic lesion of sacral region 07/25/2018   Nonallopathic lesion of lumbosacral region 07/25/2018   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   Acute bursitis of right shoulder 12/06/2017   Antalgic gait 01/24/2017   Intercostal muscle  tear 07/19/2016   PD (Parkinson's disease) (HCC) 06/24/2016   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  SUBJECTIVE:   SUBJECTIVE STATEMENT: *** Pt reports that he had a busy day yesterday  Pt accompanied by: self  PERTINENT HISTORY:  bilateral STN DBS 01/2017, hx of partial tear of L rotator cuff, hx of bilateral shoulder impingement per pt, hx of slipped rib syndrome, anxiety  PRECAUTIONS: bilateral STN DBS 01/2017  WEIGHT BEARING RESTRICTIONS No  PAIN:  Are you having pain? No  FALLS: Has patient fallen in last 6 months? No  LIVING ENVIRONMENT: Lives with: lives with their spouse  PLOF: Independent, and Leisure: golf, goes to [employer]! Moves, cycling, and lifts weights  PATIENT GOALS  improve writing, improve golf (ball on tee, putting--speed), improve putting on necklace, cutting food, taking out contact  OBJECTIVE:    TODAY'S TREATMENT:    ***  Reviewed PWR! Hands basic 4, modified quadraped min v.c for timing and amplitude. Pt was cues to pause in between exercises, he performed 3 foot taps  and stretch with each PWR! Step. Handwriting strategies, starting with printing the alphabet initially with emphasis on timing, cues for pausing between each letter. Then pt wrote sentences using the big  movement strategies with good success., letter size and legibility. and word,   Simulated placing golf tees into pegboard on floor, pause between each one, and flicks min v.c  Putting golf balls with min v.c to pause swing and slow down for improved  timing. Step and reach Flipping large playing cards , min v.c for timing Placing small pegs into vertical pegboard with left and right UE's min v.c  for timing   PATIENT EDUCATION: Education details: *** PWR! Hands basic 4 quadraped, focus on timing and large amplitude Person educated: Patient Education method: Explanation, demonstration, v.c  Education comprehension: verbalized understanding, returned demo   HOME EXERCISE PROGRAM: 05/23/22-PWR! Hands basic 4 review, pt already has handout 05/25/22- PWR! Quadraped basic 4 review     GOALS: Potential Goals reviewed with patient? Yes  SHORT TERM GOALS: Target date: 05/31/2022    Pt will be independent with updated PD-specific HEP with emphasis on timing of movement and large amplitude. Goal status: INITIAL  2.  Pt will verbalize understanding of ways to decr risk of future complications related to PD. Goal status: INITIAL    LONG TERM GOALS: Target date: 06/14/2022    Pt will verbalize understanding of adaptive strategies to incr ease with ADLs/IADLs and decr risk of future complications (including putting on necklace, writing, cutting food, golf, putting contacts in/out, and improved timing of movement). Goal status: INITIAL  2.  Pt will be able to write at least 4 sentences/lines with 100% legibility and no significant decr in size. Goal status: INITIAL  3.  Pt will improve bilateral coordination and functional reaching for ADLs/IADLs as shown by improving score on box and blocks test by 4. Baseline: R-54, L-53 blocks Goal status: INITIAL  4.  Pt will reports putting golf ball on tee and putting golf ball as only somewhat difficult (3/7). Goal status: INITIAL   ASSESSMENT:  CLINICAL IMPRESSION: *** Pt is progressing towards goals and responds well to cueing for timing and pauses between movements.  Pt demonstrates improved hand writing with practice, and cues.  PERFORMANCE DEFICITS in functional skills including ADLs,  IADLs, coordination, dexterity, tone, ROM, FMC, GMC, balance, and UE functional use and psychosocial skills including habits.   IMPAIRMENTS are limiting patient from ADLs, IADLs, and leisure.   COMORBIDITIES may have co-morbidities  that affects occupational performance. Patient will benefit from skilled OT to address above impairments and improve overall function.  MODIFICATION OR ASSISTANCE TO COMPLETE EVALUATION: Min-Moderate modification of tasks or assist with assess necessary to complete an evaluation.  OT OCCUPATIONAL PROFILE AND HISTORY: Detailed assessment: Review of records and additional review of physical, cognitive, psychosocial history related to current functional performance.  CLINICAL DECISION MAKING: Moderate - several treatment options, min-mod task modification necessary  REHAB POTENTIAL: Good  EVALUATION COMPLEXITY: Moderate       PLAN: OT FREQUENCY: 2x/week  OT DURATION: 6 weeks or 12 visits over 8 wks due to scheduling; however, anticipate that pt may only need 9 visits  PLANNED INTERVENTIONS: self care/ADL training, therapeutic exercise, therapeutic activity, neuromuscular re-education, manual therapy, passive range of motion, balance training, functional mobility training, moist heat, patient/family education, energy conservation, coping strategies training, and DME and/or AE instructions  RECOMMENDED OTHER SERVICES: speech therapy--eval today  CONSULTED AND AGREED WITH PLAN OF CARE: Patient  PLAN FOR NEXT SESSION:  *** ADL strategies, functional reach with coordination with emphasis on timing and stop/start of each movement   Ezekial Arns, OT 05/26/2022, 2:27 PM   ***

## 2022-05-30 ENCOUNTER — Encounter: Payer: Self-pay | Admitting: Occupational Therapy

## 2022-05-30 ENCOUNTER — Ambulatory Visit: Payer: Medicare Other | Admitting: Occupational Therapy

## 2022-05-30 DIAGNOSIS — R278 Other lack of coordination: Secondary | ICD-10-CM

## 2022-05-30 DIAGNOSIS — R293 Abnormal posture: Secondary | ICD-10-CM

## 2022-05-30 DIAGNOSIS — R29818 Other symptoms and signs involving the nervous system: Secondary | ICD-10-CM | POA: Diagnosis not present

## 2022-05-30 DIAGNOSIS — R2681 Unsteadiness on feet: Secondary | ICD-10-CM

## 2022-05-30 DIAGNOSIS — R29898 Other symptoms and signs involving the musculoskeletal system: Secondary | ICD-10-CM

## 2022-06-02 ENCOUNTER — Ambulatory Visit: Payer: Medicare Other

## 2022-06-02 DIAGNOSIS — R471 Dysarthria and anarthria: Secondary | ICD-10-CM

## 2022-06-02 DIAGNOSIS — R29818 Other symptoms and signs involving the nervous system: Secondary | ICD-10-CM | POA: Diagnosis not present

## 2022-06-02 NOTE — Therapy (Signed)
OUTPATIENT SPEECH LANGUAGE PATHOLOGY TREATMENT NOTE   Patient Name: Ian [name] MRN: [id number] DOB:1961/11/22, 61 y.o., male Today's Date: 06/02/2022  PCP: Bernerd Limbo, MD REFERRING PROVIDER: Ludwig Clarks, DO  END OF SESSION:   End of Session - 06/02/22 1146     Visit Number 3    Number of Visits 17    Date for SLP Re-Evaluation 06/28/22    Authorization Type Medicare/Tricare    SLP Start Time 1146    SLP Stop Time  1230    SLP Time Calculation (min) 44 min    Activity Tolerance Patient tolerated treatment well              Past Medical History:  Diagnosis Date   Anxiety    History of kidney stones    passed   PD (Parkinson's disease) ([place]) 09/10/2013   PONV (postoperative nausea and vomiting)    nausea    Past Surgical History:  Procedure Laterality Date   COLONOSCOPY     MINOR PLACEMENT OF FIDUCIAL N/A 01/31/2017   Procedure: MINOR PLACEMENT OF FIDUCIAL;  Surgeon: Erline Levine, MD;  Location: [place];  Service: Neurosurgery;  Laterality: N/A;  Fiducial placement   none     PULSE GENERATOR IMPLANT Right 02/17/2017   Procedure: Right implantable pulse generator placement;  Surgeon: Erline Levine, MD;  Location: [place];  Service: Neurosurgery;  Laterality: Right;  Bilateral implantable pulse generator placement   SUBTHALAMIC STIMULATOR BATTERY REPLACEMENT N/A 10/19/2018   Procedure: Deep Brain stimulator implantable pulse generator revision;  Surgeon: Erline Levine, MD;  Location: [place];  Service: Neurosurgery;  Laterality: N/A;  Deep Brain stimulator implantable pulse generator revision   SUBTHALAMIC STIMULATOR INSERTION Bilateral 02/10/2017   Procedure: Bilateral Deep brain stimulator placement;  Surgeon: Erline Levine, MD;  Location: [place];  Service: Neurosurgery;  Laterality: Bilateral;  Bilateral Deep brain stimulator placement   UPPER GASTROINTESTINAL ENDOSCOPY     Patient Active Problem List   Diagnosis Date Noted   Closed fracture of phalanx of left fifth toe  10/19/2021   Tinea versicolor 05/28/2021   Left rotator cuff tear 08/20/2020   Nonallopathic lesion of sacral region 07/25/2018   Nonallopathic lesion of lumbosacral region 07/25/2018   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   Acute bursitis of right shoulder 12/06/2017   Antalgic gait 01/24/2017   Intercostal muscle tear 07/19/2016   PD (Parkinson's disease) ([place]) 06/24/2016   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 No  OBJECTIVE:   TODAY'S TREATMENT:  06-02-22: Reported completion of HEP 1x/day. Targeted improving vocal quality, maintaining intensity, and reducing rate through progressively difficulty speech tasks using Speak Out! program, lesson #5. ST leads pt through exercises providing rare model prior to pt execution. Averages this date: loud "ah" 93 dB; reading 74 dB; cognitive speech task 75 dB. Rare min A required this date to slow rate during structured practice. Conversational sample of approx 10 minutes, pt averages WNL volume with occasional min-A to be  intentional to reduce rate of speech. Education provided re: throat clear alternatives to reduce some reported laryngeal irritation due to throat clearing and abdominal breathing to optimize breath support. Handout provided with pt able to demonstrate return of education with mod I. Pt reportedly pleased with education and training in ST  intervention thus far with some improvements in functional communication reported.   05-25-22: Pt returned with Speak Out! Workbook. Educated principles of program and plan to target reducing rate of speech via intentional practice. Targeted improving vocal quality, maintaining intensity, and reducing rate through progressively difficulty speech tasks using Speak Out! program, lesson #1. ST leads pt through exercises providing usual model prior to pt execution. Rare min-A required to achieve target dB this date. Averages this date: loud "ah" 90 dB; reading 74 dB; cognitive speech task 75  dB. Rare min A required this date to slow rate during structured practice. Conversational sample of approx 10 minutes, pt averages WNL volume with occasional min-A to be intentional to reduce rate of speech.  05-03-22: Education on SLP observations, evaluation results, recommendations for skilled ST. Instruction on using intentional speech to control rate, resulting in increased intelligibility. Oral reading practice with usual mod-A for rate.      PATIENT EDUCATION: Education details: see above Person educated: Patient Education method: Education officer, environmental, Verbal cues, and Handouts Education comprehension: verbalized understanding, returned demonstration, verbal cues required, and needs further education     HOME EXERCISE PROGRAM: Order Speak Out! workbook         GOALS: Goals reviewed with patient? Yes   SHORT TERM GOALS: Target date: 05/31/2022     Pt will maintain WNL rate of speech during structured practice (oral reading, cognitive exercise, etc.), multi-sentence level, with occasional min-A over 2 sessions.  Baseline: 05-25-22, 06-02-22 Goal status: Met   2.  Pt will self-ID rushes in speech, as they occur, in 50% of opportunities to increase ability to self-correct rate with occasional min-A over 2 sessions.  Baseline:  Goal status: Partially Met (only seen 2 tx visits thus far)   3.  Pt  will report completion of dysarthria HEP at least 1x/day over 1 week period with mod-I Baseline:  Goal status: Met   LONG TERM GOALS: Target date: 06/28/2022     Pt will demonstrate average WNL rate of speech over 18+ minute conversational sample with no more than 4 rushes of speech over sample period, with rare min-A.  Baseline:  Goal status: ongoing   2.  Pt will ID and self-correct rushes in rate of speech in 70% of occurences in structured speech practice (e.g. oral reading) over 2 sessions with rare min-A.  Baseline:  Goal status: ongoing   3.  Pt will report improvement via CPIB PROM by 4 points by d/c, indicative subjective improvement in communicative abilities.  Baseline:  Goal status: ongoing   ASSESSMENT:   CLINICAL IMPRESSION: Patient is a 61 y.o. M who was seen today for dysarthria 2/2 Parkinson's. Pt presents with volume within gross normal limits, across 20 minute conversational sample. Intelligibility impacted primarily by rate of speech. Rate is variable, ranging from normal to frequent rushes. During rushes, also demonstrating initial part word repetitions. Tells ST he always spoke fast but since DBT, has increased and is negative impacting ability to communicate. Is avoiding communication, as his speech is "giving me anxiety." Conducted ongoing education and training of Speak Out! Program and dysarthria compensations to optimize carryover of intentional speech to effectively reduce speech rate. I recommend  skilled ST to address motor speech to increase intelligibility and pt's ability to effectively communicate.    OBJECTIVE IMPAIRMENTS  Objective impairments include dysarthria. These impairments are limiting patient from effectively communicating at home and in community.Factors affecting potential to achieve goals and functional outcome are medical prognosis.. Patient will benefit from skilled SLP services to address above impairments and improve overall function.    REHAB POTENTIAL: Good   PLAN: SLP FREQUENCY: 2x/week   SLP DURATION: 8 weeks   PLANNED INTERVENTIONS: SLP instruction and feedback, Compensatory strategies, and Patient/family education   Marzetta Board, CCC-SLP 06/02/2022, 11:47 AM

## 2022-06-02 NOTE — Patient Instructions (Addendum)
  ABDOMINAL BREATHING (5 minutes multiple times a day)   Shoulders down - this is a cue to relax Place your hand on your abdomen - this helps you focus on easy abdominal breath support - the best and most relaxed way to breathe Breathe in through your nose and fill your belly with air, watching your hand move outward Breathe out through your mouth and watch your belly move in. An audible "sh"  may help   Think of your belly as a balloon, when you fill with air (inhale), the balloon gets bigger. As the air goes out (exhale), the balloon deflates.  If you are having difficulty coordinating this, lay on your back with a plastic cup on your belly and repeat the above steps, watching you belly move up with inhalation and down with exhalations  Practice breathing in and out in front of a mirror, watching your belly Breathe in for a count of 5 and breathe out for a count of 5  Now as you breathe out, get a picture of relaxing in your mind Feel the constant in-out of your breathing with your belly Picture the tension in your throat and chest evaporate like steam, melting away and FEEL it do so Picture your throat opening up so wide that a grapefruit or softball could fit through your throat.   Practice this throughout the day when you are not having symptoms. For example: in the car, when watching TV, before medications. Regular practice when you are feeling well is important.  Make it automatic and use it at the first sense of throat tightness to prevent or suppress the VCD. You may start with the inhale or exhale.

## 2022-06-06 NOTE — Progress Notes (Unsigned)
[name] [address] [place] [address] Phone: [phone] Subjective:   Ian [name], am serving as a scribe for Dr. Antoine Primas.   I'm seeing this patient by the request  of:  Tracey Harries, MD  CC: Neck and back pain follow-up  DZH:[id number]  [name] is a 61 y.o. male coming in with complaint of back and neck pain. OMT 04/25/2022. Patient states that he is doing relatively well.  Did do a lot of lifting outside.  Feels like that may be contributing to some of the discomfort.  With nothing that is stopping him from activity.  Medications patient has been prescribed: None  Taking:         Reviewed prior external information including notes and imaging from previsou exam, outside providers and external EMR if available.   As well as notes that were available from care everywhere and other healthcare systems.  Past medical history, social, surgical and family history all reviewed in electronic medical record.  No pertanent information unless stated regarding to the chief complaint.   Past Medical History:  Diagnosis Date   Anxiety    History of kidney stones    passed   PD (Parkinson's disease) (HCC) 09/10/2013   PONV (postoperative nausea and vomiting)    nausea     No Known Allergies   Review of Systems:  No headache, visual changes, nausea, vomiting, diarrhea, constipation, dizziness, abdominal pain, skin rash, fevers, chills, night sweats, weight loss, swollen lymph nodes, body aches, joint swelling, chest pain, shortness of breath, mood changes. POSITIVE muscle aches  Objective  Pulse (!) 59, height 6\' 2"  (1.88 m), SpO2 98 %.   General: No apparent distress alert and oriented x3 mood and affect normal, dressed appropriately.  HEENT: Pupils equal, extraocular movements intact  Respiratory: Patient's speak in full sentences and does not appear short of breath  Cardiovascular: No lower extremity edema, non  tender, no erythema  MSK low back exam is tightness noted.  Hypertonicity noted.  Patient does have positive FABER test bilaterally.  Lacks last 10 degrees of extension.  Osteopathic findings  C2 flexed rotated and side bent right C7 flexed rotated and side bent left T3 extended rotated and side bent right inhaled rib T8 extended rotated and side bent left L2 flexed rotated and side bent right Sacrum right on right     Assessment and Plan:  Slipped rib syndrome Patient has been doing relatively well.  Does have the underlying Parkinson disease that also contributes to some of the different muscle aches and pain.  Patient was able to do all of these without any significant difficulty discussed which activities to do and which ones to avoid.  Increase activity slowly otherwise.  Follow-up with me again in 6 to 8 weeks.    Nonallopathic problems  Decision today to treat with OMT was based on Physical Exam  After verbal consent patient was treated with HVLA, ME, FPR techniques in cervical, rib, thoracic, lumbar, and sacral  areas  Patient tolerated the procedure well with improvement in symptoms  Patient given exercises, stretches and lifestyle modifications  See medications in patient instructions if given  Patient will follow up in 4-8 weeks     The above documentation has been reviewed and is accurate and complete , DO         Note: This dictation was prepared with Dragon dictation along with smaller phrase technology. Any transcriptional errors that result  from this process are unintentional.

## 2022-06-07 ENCOUNTER — Ambulatory Visit (INDEPENDENT_AMBULATORY_CARE_PROVIDER_SITE_OTHER): Payer: Medicare Other | Admitting: Family Medicine

## 2022-06-07 VITALS — HR 59 | Ht 74.0 in

## 2022-06-07 DIAGNOSIS — M9908 Segmental and somatic dysfunction of rib cage: Secondary | ICD-10-CM | POA: Diagnosis not present

## 2022-06-07 DIAGNOSIS — M9903 Segmental and somatic dysfunction of lumbar region: Secondary | ICD-10-CM | POA: Diagnosis not present

## 2022-06-07 DIAGNOSIS — M9904 Segmental and somatic dysfunction of sacral region: Secondary | ICD-10-CM | POA: Diagnosis not present

## 2022-06-07 DIAGNOSIS — M9901 Segmental and somatic dysfunction of cervical region: Secondary | ICD-10-CM | POA: Diagnosis not present

## 2022-06-07 DIAGNOSIS — M9902 Segmental and somatic dysfunction of thoracic region: Secondary | ICD-10-CM

## 2022-06-07 DIAGNOSIS — M940 Chondrocostal junction syndrome [Tietze]: Secondary | ICD-10-CM

## 2022-06-07 NOTE — Assessment & Plan Note (Signed)
Patient has been doing relatively well.  Does have the underlying Parkinson disease that also contributes to some of the different muscle aches and pain.  Patient was able to do all of these without any significant difficulty discussed which activities to do and which ones to avoid.  Increase activity slowly otherwise.  Follow-up with me again in 6 to 8 weeks.

## 2022-06-07 NOTE — Patient Instructions (Signed)
Good to see you Happy [place] Ouch See me in 2 months

## 2022-06-08 ENCOUNTER — Ambulatory Visit: Payer: Medicare Other

## 2022-06-08 ENCOUNTER — Ambulatory Visit: Payer: Medicare Other | Admitting: Occupational Therapy

## 2022-06-08 DIAGNOSIS — R29818 Other symptoms and signs involving the nervous system: Secondary | ICD-10-CM

## 2022-06-08 DIAGNOSIS — R29898 Other symptoms and signs involving the musculoskeletal system: Secondary | ICD-10-CM

## 2022-06-08 DIAGNOSIS — R293 Abnormal posture: Secondary | ICD-10-CM

## 2022-06-08 DIAGNOSIS — R471 Dysarthria and anarthria: Secondary | ICD-10-CM

## 2022-06-08 DIAGNOSIS — R2681 Unsteadiness on feet: Secondary | ICD-10-CM

## 2022-06-08 DIAGNOSIS — R278 Other lack of coordination: Secondary | ICD-10-CM

## 2022-06-08 NOTE — Therapy (Signed)
OUTPATIENT OCCUPATIONAL THERAPY PARKINSON'S TREATMENT  Patient Name: Ian [name] MRN: [id number] DOB:12/28/1960, 61 y.o., male Today's Date: 06/08/2022  PCP: Dr. Tracey Harries REFERRING PROVIDER: Dr. Lurena Joiner [name]   OT End of Session - 06/08/22 0841     Visit Number 6    Number of Visits 13    Date for OT Re-Evaluation 07/02/22    Authorization Type Medicare & Tricare for Life--Covered 100%    Authorization - Visit Number 6    Authorization - Number of Visits 10    Progress Note Due on Visit 10    OT Start Time 0809    OT Stop Time 0850    OT Time Calculation (min) 41 min    Activity Tolerance Patient tolerated treatment well    Behavior During Therapy [facility] for tasks assessed/performed                 Past Medical History:  Diagnosis Date   Anxiety    History of kidney stones    passed   PD (Parkinson's disease) (HCC) 09/10/2013   PONV (postoperative nausea and vomiting)    nausea    Past Surgical History:  Procedure Laterality Date   COLONOSCOPY     MINOR PLACEMENT OF FIDUCIAL N/A 01/31/2017   Procedure: MINOR PLACEMENT OF FIDUCIAL;  Surgeon: Maeola Harman, MD;  Location: [facility] OR;  Service: Neurosurgery;  Laterality: N/A;  Fiducial placement   none     PULSE GENERATOR IMPLANT Right 02/17/2017   Procedure: Right implantable pulse generator placement;  Surgeon: Maeola Harman, MD;  Location: [facility] OR;  Service: Neurosurgery;  Laterality: Right;  Bilateral implantable pulse generator placement   SUBTHALAMIC STIMULATOR BATTERY REPLACEMENT N/A 10/19/2018   Procedure: Deep Brain stimulator implantable pulse generator revision;  Surgeon: Maeola Harman, MD;  Location: [facility] OR;  Service: Neurosurgery;  Laterality: N/A;  Deep Brain stimulator implantable pulse generator revision   SUBTHALAMIC STIMULATOR INSERTION Bilateral 02/10/2017   Procedure: Bilateral Deep brain stimulator placement;  Surgeon: Maeola Harman, MD;  Location[facility] OR;  Service: Neurosurgery;  Laterality: Bilateral;   Bilateral Deep brain stimulator placement   UPPER GASTROINTESTINAL ENDOSCOPY     Patient Active Problem List   Diagnosis Date Noted   Closed fracture of phalanx of left fifth toe 10/19/2021   Tinea versicolor 05/28/2021   Left rotator cuff tear 08/20/2020   Nonallopathic lesion of sacral region 07/25/2018   Nonallopathic lesion of lumbosacral region 07/25/2018   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   Acute bursitis of right shoulder 12/06/2017   Antalgic gait 01/24/2017   Intercostal muscle tear 07/19/2016   PD (Parkinson's disease) (HCC) 06/24/2016   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   Nonallopathic lesion of thoracic region 01/25/2016   Nonallopathic lesion-rib cage 01/25/2016   Nonallopathic lesion of cervical region 01/25/2016   Parkinson's disease (HCC) 01/25/2016   Lateral epicondylitis 12/01/2014   REM behavioral disorder 08/27/2014   Paralysis agitans (HCC) 10/18/2013    ONSET DATE: PD screen date 04/21/22  REFERRING DIAG:  G20 (ICD-10-CM) - Parkinson's disease (HCC)   THERAPY DIAG:  Other symptoms and signs involving the nervous system  Other lack of coordination  Abnormal posture  Unsteadiness on feet  Other symptoms and signs involving the musculoskeletal system  SUBJECTIVE:   SUBJECTIVE STATEMENT: Pt denies pain  Pt accompanied by: self  PERTINENT HISTORY:  bilateral STN DBS 01/2017, hx of partial tear of L rotator cuff, hx of bilateral shoulder impingement per pt, hx of slipped rib syndrome, anxiety  PRECAUTIONS: bilateral STN  DBS 01/2017  WEIGHT BEARING RESTRICTIONS No  PAIN:  Are you having pain? No  FALLS: Has patient fallen in last 6 months? No  LIVING ENVIRONMENT: Lives with: lives with their spouse  PLOF: Independent, and Leisure: golf, goes to [employer]! Moves, cycling, and lifts weights  PATIENT GOALS  improve writing, improve golf (ball on tee, putting--speed), improve putting on necklace, cutting food, taking out contact.  OBJECTIVE:    TODAY'S TREATMENT:   Warm up- PWR! rock and twist modified quadraped and PWR! rock in prone on elbows min v.c for deliberate controlled movements.  Ambulating while rotating 2 golf balls in hand alternating between left and right hands, and dual tasking to name city, state or country for each letter of the alphabet.  Functional reaching and fine motor coordination task to copy small peg design with left and right hands alternately then simultaneously, min v.c for timing  Sliding cards across table with both hands simultaneous with min cueing for timing and pause prior to movement for improved movement amplitude and size. Pt with greater difficulty performing with RUE, so he performed with RUE only, with improved performance. Flipping playing cards with bilateral UE's simultaneously min v.c    Writing activity with pt printing alphabet to encourage pause between letters then printing 2 sentences with good legibility and letter size.  PATIENT EDUCATION: Education details:  focus on timing and large amplitude Person educated: Patient Education method: Explanation, demonstration, v.c  Education comprehension: verbalized understanding, returned demo   HOME EXERCISE PROGRAM: 05/23/22-PWR! Hands basic 4 review, pt already has handout 05/25/22- PWR! Quadraped basic 4 review     GOALS: Potential Goals reviewed with patient? Yes  SHORT TERM GOALS: Target date: 05/31/2022    Pt will be independent with updated PD-specific HEP with emphasis on timing of movement and large amplitude. Goal status: INITIAL  2.  Pt will verbalize understanding of ways to decr risk of future complications related to PD. Goal status: INITIAL    LONG TERM GOALS: Target date: 06/14/2022    Pt will verbalize understanding of adaptive strategies to incr ease with ADLs/IADLs and decr risk of future complications (including putting on necklace, writing, cutting food, golf, putting contacts in/out, and improved timing  of movement). Goal status: INITIAL  2.  Pt will be able to write at least 4 sentences/lines with 100% legibility and no significant decr in size. Goal status: INITIAL  3.  Pt will improve bilateral coordination and functional reaching for ADLs/IADLs as shown by improving score on box and blocks test by 4. Baseline: R-54, L-53 blocks Goal status: INITIAL  4.  Pt will reports putting golf ball on tee and putting golf ball as only somewhat difficult (3/7). Goal status: INITIAL   ASSESSMENT:  CLINICAL IMPRESSION: Pt is progressing towards goals and responds well to cueing for timing and pauses between movements.  Pt demonstrates improving perfromanc eof functional activities as his awareness increases. PERFORMANCE DEFICITS in functional skills including ADLs, IADLs, coordination, dexterity, tone, ROM, FMC, GMC, balance, and UE functional use and psychosocial skills including habits.   IMPAIRMENTS are limiting patient from ADLs, IADLs, and leisure.   COMORBIDITIES may have co-morbidities  that affects occupational performance. Patient will benefit from skilled OT to address above impairments and improve overall function.  MODIFICATION OR ASSISTANCE TO COMPLETE EVALUATION: Min-Moderate modification of tasks or assist with assess necessary to complete an evaluation.  OT OCCUPATIONAL PROFILE AND HISTORY: Detailed assessment: Review of records and additional review of physical, cognitive, psychosocial history  related to current functional performance.  CLINICAL DECISION MAKING: Moderate - several treatment options, min-mod task modification necessary  REHAB POTENTIAL: Good  EVALUATION COMPLEXITY: Moderate       PLAN: OT FREQUENCY: 2x/week  OT DURATION: 6 weeks or 12 visits over 8 wks due to scheduling; however, anticipate that pt may only need 9 visits  PLANNED INTERVENTIONS: self care/ADL training, therapeutic exercise, therapeutic activity, neuromuscular re-education, manual  therapy, passive range of motion, balance training, functional mobility training, moist heat, patient/family education, energy conservation, coping strategies training, and DME and/or AE instructions  RECOMMENDED OTHER SERVICES: speech therapy--eval today  CONSULTED AND AGREED WITH PLAN OF CARE: Patient  PLAN FOR NEXT SESSION:  continue withADL strategies, functional reach with coordination with emphasis on timing and stop/start of each movement, incorporate dual task and bilateral hand coordination   Tymon Nemetz, OT 06/08/2022, 8:45 AM   [facility] [address] [place], [place]  [address] [phone] phone [phone] 06/08/22 8:45 AM

## 2022-06-08 NOTE — Therapy (Signed)
OUTPATIENT SPEECH LANGUAGE PATHOLOGY TREATMENT NOTE   Patient Name: Ian [name] MRN: [id number] DOB:1961-10-10, 61 y.o., male Today's Date: 06/08/2022  PCP: Bernerd Limbo, MD REFERRING PROVIDER: Ludwig Clarks, DO  END OF SESSION:   End of Session - 06/08/22 0830     Visit Number 4    Number of Visits 17    Date for SLP Re-Evaluation 06/28/22    Authorization Type Medicare/Tricare    SLP Start Time 0850    SLP Stop Time  0935    SLP Time Calculation (min) 45 min    Activity Tolerance Patient tolerated treatment well              Past Medical History:  Diagnosis Date   Anxiety    History of kidney stones    passed   PD (Parkinson's disease) ([place]) 09/10/2013   PONV (postoperative nausea and vomiting)    nausea    Past Surgical History:  Procedure Laterality Date   COLONOSCOPY     MINOR PLACEMENT OF FIDUCIAL N/A 01/31/2017   Procedure: MINOR PLACEMENT OF FIDUCIAL;  Surgeon: Erline Levine, MD;  Location: [place];  Service: Neurosurgery;  Laterality: N/A;  Fiducial placement   none     PULSE GENERATOR IMPLANT Right 02/17/2017   Procedure: Right implantable pulse generator placement;  Surgeon: Erline Levine, MD;  Location: [place];  Service: Neurosurgery;  Laterality: Right;  Bilateral implantable pulse generator placement   SUBTHALAMIC STIMULATOR BATTERY REPLACEMENT N/A 10/19/2018   Procedure: Deep Brain stimulator implantable pulse generator revision;  Surgeon: Erline Levine, MD;  Location: [place];  Service: Neurosurgery;  Laterality: N/A;  Deep Brain stimulator implantable pulse generator revision   SUBTHALAMIC STIMULATOR INSERTION Bilateral 02/10/2017   Procedure: Bilateral Deep brain stimulator placement;  Surgeon: Erline Levine, MD;  Location: [place];  Service: Neurosurgery;  Laterality: Bilateral;  Bilateral Deep brain stimulator placement   UPPER GASTROINTESTINAL ENDOSCOPY     Patient Active Problem List   Diagnosis Date Noted   Closed fracture of phalanx of left fifth toe  10/19/2021   Tinea versicolor 05/28/2021   Left rotator cuff tear 08/20/2020   Nonallopathic lesion of sacral region 07/25/2018   Nonallopathic lesion of lumbosacral region 07/25/2018   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   Acute bursitis of right shoulder 12/06/2017   Antalgic gait 01/24/2017   Intercostal muscle tear 07/19/2016   PD (Parkinson's disease) ([place]) 06/24/2016   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   Nonallopathic lesion of thoracic region 01/25/2016   Nonallopathic lesion-rib cage 01/25/2016   Nonallopathic lesion of cervical region 01/25/2016   Parkinson's disease ([place]) 01/25/2016   Lateral epicondylitis 12/01/2014   REM behavioral disorder 08/27/2014   Paralysis agitans ([place]) 10/18/2013    ONSET DATE: dx Parkinson's 2014, DBS 2017, referred for ST 04/28/2022  REFERRING DIAG: G20 (ICD-10-CM) - Parkinson's disease  THERAPY DIAG: Dysarthria and anarthria  Rationale for Evaluation and Treatment Rehabilitation  SUBJECTIVE: "I think better" re: speech   PAIN:  Are you having pain? No  OBJECTIVE:   TODAY'S TREATMENT:  06-08-22: Reported completion of HEP 1x/day due to busy schedule. Targeted improving vocal quality, maintaining intensity, and reducing rate through progressively difficulty speech tasks using Speak Out! program, lesson #8. ST leads pt through exercises providing rare model prior to pt execution. Averages this date: loud "ah" 90 dB; reading 73 dB; cognitive speech task 74 dB. Rare min A required this date to slow rate during structured practice. Conversational sample of approx 10 minutes, pt averages WNL volume  with occasional min-A to be intentional to reduce rate of speech and trial technique to reduce stutter (relaxed exhalation, restart). Good carryover of education re: throat clear alternatives and abdominal breathing exhibited given occasional min A.   06-02-22: Reported completion of HEP 1x/day. Targeted improving vocal quality, maintaining  intensity, and reducing rate through progressively difficulty speech tasks using Speak Out! program, lesson #5. ST leads pt through exercises providing rare model prior to pt execution. Averages this date: loud "ah" 93 dB; reading 74 dB; cognitive speech task 75 dB. Rare min A required this date to slow rate during structured practice. Conversational sample of approx 10 minutes, pt averages WNL volume with occasional min-A to be intentional to reduce rate of speech. Education provided re: throat clear alternatives to reduce some reported laryngeal irritation due to throat clearing and abdominal breathing to optimize breath support. Handout provided with pt able to demonstrate return of education with mod I. Pt reportedly pleased with education and training in ST intervention thus far with some improvements in functional communication reported.   05-25-22: Pt returned with Speak Out! Workbook. Educated principles of program and plan to target reducing rate of speech via intentional practice. Targeted improving vocal quality, maintaining intensity, and reducing rate through progressively difficulty speech tasks using Speak Out! program, lesson #1. ST leads pt through exercises providing usual model prior to pt execution. Rare min-A required to achieve target dB this date. Averages this date: loud "ah" 90 dB; reading 74 dB; cognitive speech task 75  dB. Rare min A required this date to slow rate during structured practice. Conversational sample of approx 10 minutes, pt averages WNL volume with occasional min-A to be intentional to reduce rate of speech.  05-03-22: Education on SLP observations, evaluation results, recommendations for skilled ST. Instruction on using intentional speech to control rate, resulting in increased intelligibility. Oral reading practice with usual mod-A for rate.      PATIENT EDUCATION: Education details: see above Person educated: Patient Education method: Education officer, environmental,  Verbal cues, and Handouts Education comprehension: verbalized understanding, returned demonstration, verbal cues required, and needs further education     HOME EXERCISE PROGRAM: Speak Out! workbook         GOALS: Goals reviewed with patient? Yes   SHORT TERM GOALS: Target date: 05/31/2022     Pt will maintain WNL rate of speech during structured practice (oral reading, cognitive exercise, etc.), multi-sentence level, with occasional min-A over 2 sessions.  Baseline: 05-25-22, 06-02-22 Goal status: Met   2.  Pt will self-ID rushes in speech, as they occur, in 50% of opportunities to increase ability to self-correct rate with occasional min-A over 2 sessions.  Baseline:  Goal status: Partially Met (only seen 2 tx visits thus far)   3.  Pt will report completion of dysarthria HEP at least 1x/day over 1 week period with mod-I Baseline:  Goal status: Met   LONG TERM GOALS: Target date: 06/28/2022     Pt will demonstrate average WNL rate of speech over 18+ minute conversational sample with no more than 4 rushes of speech over sample period, with rare min-A.  Baseline:  Goal status: ongoing   2.  Pt will ID and self-correct rushes in rate of speech in 70% of occurences in structured speech practice (e.g. oral reading) over 2 sessions with rare min-A.  Baseline:  Goal status: ongoing   3.  Pt will report improvement via CPIB PROM by 4 points by d/c, indicative subjective improvement in communicative abilities.  Baseline:  Goal status: ongoing   ASSESSMENT:   CLINICAL IMPRESSION: Patient is a 61 y.o. M who was seen today for dysarthria 2/2 Parkinson's. Pt presents with volume within gross normal limits, across 20 minute conversational sample. Intelligibility impacted primarily by rate of speech. Rate is variable, ranging from normal to frequent rushes. During rushes, also demonstrating initial part word repetitions. Tells ST he always spoke fast but since DBS, has increased and is  negative impacting ability to communicate. Is avoiding communication, as his speech is "giving me anxiety." Conducted ongoing education and training of Speak Out! Program and dysarthria compensations to optimize carryover of intentional speech to effectively reduce speech rate. I recommend skilled ST to address motor speech to increase intelligibility and pt's ability to effectively communicate.    OBJECTIVE IMPAIRMENTS  Objective impairments include dysarthria. These impairments are limiting patient from effectively communicating at home and in community.Factors affecting potential to achieve goals and functional outcome are medical prognosis.. Patient will benefit from skilled SLP services to address above impairments and improve overall function.   REHAB POTENTIAL: Good   PLAN: SLP FREQUENCY: 2x/week   SLP DURATION: 8 weeks   PLANNED INTERVENTIONS: SLP instruction and feedback, Compensatory strategies, and Patient/family education   Marzetta Board, CCC-SLP 06/08/2022, 8:30 AM

## 2022-06-12 NOTE — Therapy (Signed)
OUTPATIENT OCCUPATIONAL THERAPY PARKINSON'S TREATMENT  Patient Name: Ian [name] MRN: [id number] DOB:Oct 18, 1961, 61 y.o., male Today's Date: 06/13/2022  PCP: Dr. Tracey Harries REFERRING PROVIDER: Dr. Lurena Joiner [name]   OT End of Session - 06/13/22 0935     Visit Number 7    Number of Visits 13    Date for OT Re-Evaluation 07/02/22    Authorization Type Medicare & Tricare for Life--Covered 100%    Authorization - Visit Number 7    Authorization - Number of Visits 10    Progress Note Due on Visit 10    OT Start Time 0935    OT Stop Time 1015    OT Time Calculation (min) 40 min    Activity Tolerance Patient tolerated treatment well    Behavior During Therapy [facility] for tasks assessed/performed                  Past Medical History:  Diagnosis Date   Anxiety    History of kidney stones    passed   PD (Parkinson's disease) (HCC) 09/10/2013   PONV (postoperative nausea and vomiting)    nausea    Past Surgical History:  Procedure Laterality Date   COLONOSCOPY     MINOR PLACEMENT OF FIDUCIAL N/A 01/31/2017   Procedure: MINOR PLACEMENT OF FIDUCIAL;  Surgeon: Maeola Harman, MD;  Location: [facility] OR;  Service: Neurosurgery;  Laterality: N/A;  Fiducial placement   none     PULSE GENERATOR IMPLANT Right 02/17/2017   Procedure: Right implantable pulse generator placement;  Surgeon: Maeola Harman, MD;  Location: [facility] OR;  Service: Neurosurgery;  Laterality: Right;  Bilateral implantable pulse generator placement   SUBTHALAMIC STIMULATOR BATTERY REPLACEMENT N/A 10/19/2018   Procedure: Deep Brain stimulator implantable pulse generator revision;  Surgeon: Maeola Harman, MD;  Location: [facility] OR;  Service: Neurosurgery;  Laterality: N/A;  Deep Brain stimulator implantable pulse generator revision   SUBTHALAMIC STIMULATOR INSERTION Bilateral 02/10/2017   Procedure: Bilateral Deep brain stimulator placement;  Surgeon: Maeola Harman, MD;  Location: [facility] OR;  Service: Neurosurgery;  Laterality: Bilateral;   Bilateral Deep brain stimulator placement   UPPER GASTROINTESTINAL ENDOSCOPY     Patient Active Problem List   Diagnosis Date Noted   Closed fracture of phalanx of left fifth toe 10/19/2021   Tinea versicolor 05/28/2021   Left rotator cuff tear 08/20/2020   Nonallopathic lesion of sacral region 07/25/2018   Nonallopathic lesion of lumbosacral region 07/25/2018   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   Acute bursitis of right shoulder 12/06/2017   Antalgic gait 01/24/2017   Intercostal muscle tear 07/19/2016   PD (Parkinson's disease) (HCC) 06/24/2016   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   Nonallopathic lesion of thoracic region 01/25/2016   Nonallopathic lesion-rib cage 01/25/2016   Nonallopathic lesion of cervical region 01/25/2016   Parkinson's disease (HCC) 01/25/2016   Lateral epicondylitis 12/01/2014   REM behavioral disorder 08/27/2014   Paralysis agitans (HCC) 10/18/2013    ONSET DATE: PD screen date 04/21/22  REFERRING DIAG:  G20 (ICD-10-CM) - Parkinson's disease (HCC)   THERAPY DIAG:  Other symptoms and signs involving the nervous system  Other lack of coordination  Abnormal posture  Unsteadiness on feet  Other symptoms and signs involving the musculoskeletal system  SUBJECTIVE:   SUBJECTIVE STATEMENT: Pt reports that he is trying to be more intentional about his movements  Pt accompanied by: self  PERTINENT HISTORY:  bilateral STN DBS 01/2017, hx of partial tear of L rotator cuff, hx of bilateral shoulder impingement per  pt, hx of slipped rib syndrome, anxiety  PRECAUTIONS: bilateral STN DBS 01/2017  WEIGHT BEARING RESTRICTIONS No  PAIN:  Are you having pain? No  FALLS: Has patient fallen in last 6 months? No  LIVING ENVIRONMENT: Lives with: lives with their spouse  PLOF: Independent, and Leisure: golf, goes to [employer]! Moves, cycling, and lifts weights  PATIENT GOALS  improve writing, improve golf (ball on tee, putting--speed), improve putting on  necklace, cutting food, taking out contact.  OBJECTIVE:   TODAY'S TREATMENT:    Writing 2 self-generated sentences with exaggerated letter size with no decr in size and good legibility.  Then copying 4 sentences on 1 line with good size/no significant decr in size, good legibility, incr time.  Practiced cutting putty (simulated cutting meat) using large amplitude movement strategies:  including tip of knife in front of food and large movements.  Pt returned demo strategy with min v.c.  Dual task activity:  Functional step (over yoga block) and reach with each UE/LE alternating to flip cards with min cueing for L hand for large amplitude movements while performing category generation (naming states) with min v.c.    Dual task activity:  Tossing/catching 2 scarves with large amplitude movements (1 in each hand alternating) with therapist with category generation (animals with each letter of alphabet) with min v.c.    Stacking coins with each finger/thumb with both hands simultaneously for bilateral hand coordination and timing, then manipulating in hand with both hands simultaneously with min cueing for timing/large amplitude and min difficulty with L hand (vs R)    PATIENT EDUCATION: Education details:  continue to focus on timing and large amplitude Person educated: Patient Education method: Explanation, demonstration, v.c  Education comprehension: verbalized understanding, returned demo   HOME EXERCISE PROGRAM: 05/23/22-PWR! Hands basic 4 review, pt already has handout 05/25/22- PWR! Quadraped basic 4 review     GOALS: Potential Goals reviewed with patient? Yes  SHORT TERM GOALS: Target date: 05/31/2022    Pt will be independent with updated PD-specific HEP with emphasis on timing of movement and large amplitude. Goal status: MET  2.  Pt will verbalize understanding of ways to decr risk of future complications related to PD. Goal status: INITIAL    LONG TERM GOALS: Target  date: 06/14/2022    Pt will verbalize understanding of adaptive strategies to incr ease with ADLs/IADLs and decr risk of future complications (including putting on necklace, writing, cutting food, golf, putting contacts in/out, and improved timing of movement). Goal status: INITIAL  2.  Pt will be able to write at least 4 sentences/lines with 100% legibility and no significant decr in size. Goal status: MET 06/13/22  3.  Pt will improve bilateral coordination and functional reaching for ADLs/IADLs as shown by improving score on box and blocks test by 4. Baseline: R-54, L-53 blocks Goal status: INITIAL  4.  Pt will reports putting golf ball on tee and putting golf ball as only somewhat difficult (3/7). Goal status: INITIAL   ASSESSMENT:  CLINICAL IMPRESSION: Pt is progressing towards goals and responds well to cueing for timing and pauses between movements and needs less cueing overall.    PERFORMANCE DEFICITS in functional skills including ADLs, IADLs, coordination, dexterity, tone, ROM, FMC, GMC, balance, and UE functional use and psychosocial skills including habits.   IMPAIRMENTS are limiting patient from ADLs, IADLs, and leisure.   COMORBIDITIES may have co-morbidities  that affects occupational performance. Patient will benefit from skilled OT to address above impairments and improve  overall function.  MODIFICATION OR ASSISTANCE TO COMPLETE EVALUATION: Min-Moderate modification of tasks or assist with assess necessary to complete an evaluation.  OT OCCUPATIONAL PROFILE AND HISTORY: Detailed assessment: Review of records and additional review of physical, cognitive, psychosocial history related to current functional performance.  CLINICAL DECISION MAKING: Moderate - several treatment options, min-mod task modification necessary  REHAB POTENTIAL: Good  EVALUATION COMPLEXITY: Moderate   PLAN: OT FREQUENCY: 2x/week  OT DURATION: 6 weeks or 12 visits over 8 wks due to  scheduling; however, anticipate that pt may only need 9 visits  PLANNED INTERVENTIONS: self care/ADL training, therapeutic exercise, therapeutic activity, neuromuscular re-education, manual therapy, passive range of motion, balance training, functional mobility training, moist heat, patient/family education, energy conservation, coping strategies training, and DME and/or AE instructions  RECOMMENDED OTHER SERVICES: speech therapy--eval today  CONSULTED AND AGREED WITH PLAN OF CARE: Patient  PLAN FOR NEXT SESSION:  BEGIN CHECKING GOALS, BEGIN DISCUSSING POSSIBLE D/C NEXT WEEK?, continue with ADL strategies, functional reach with coordination with emphasis on timing and stop/start of each movement, incorporate dual task and bilateral hand coordination   [facility], OT 06/13/2022, 9:36 AM   [facility] [address] [place], [place]  [address] [phone] phone [phone] 06/13/22 9:36 AM

## 2022-06-13 ENCOUNTER — Encounter: Payer: Medicare Other | Admitting: Speech Pathology

## 2022-06-13 ENCOUNTER — Ambulatory Visit: Payer: Medicare Other | Admitting: Occupational Therapy

## 2022-06-13 ENCOUNTER — Encounter: Payer: Self-pay | Admitting: Occupational Therapy

## 2022-06-13 DIAGNOSIS — R278 Other lack of coordination: Secondary | ICD-10-CM

## 2022-06-13 DIAGNOSIS — R293 Abnormal posture: Secondary | ICD-10-CM

## 2022-06-13 DIAGNOSIS — R29818 Other symptoms and signs involving the nervous system: Secondary | ICD-10-CM

## 2022-06-13 DIAGNOSIS — R2681 Unsteadiness on feet: Secondary | ICD-10-CM

## 2022-06-13 DIAGNOSIS — R29898 Other symptoms and signs involving the musculoskeletal system: Secondary | ICD-10-CM

## 2022-06-14 ENCOUNTER — Ambulatory Visit: Payer: Medicare Other

## 2022-06-14 DIAGNOSIS — R471 Dysarthria and anarthria: Secondary | ICD-10-CM

## 2022-06-14 DIAGNOSIS — R29818 Other symptoms and signs involving the nervous system: Secondary | ICD-10-CM | POA: Diagnosis not present

## 2022-06-15 ENCOUNTER — Ambulatory Visit: Payer: Medicare Other | Admitting: Occupational Therapy

## 2022-06-15 ENCOUNTER — Encounter: Payer: Medicare Other | Admitting: Speech Pathology

## 2022-06-16 ENCOUNTER — Ambulatory Visit: Payer: Medicare Other

## 2022-06-16 DIAGNOSIS — R471 Dysarthria and anarthria: Secondary | ICD-10-CM

## 2022-06-16 DIAGNOSIS — R29818 Other symptoms and signs involving the nervous system: Secondary | ICD-10-CM | POA: Diagnosis not present

## 2022-06-16 NOTE — Therapy (Signed)
OUTPATIENT SPEECH LANGUAGE PATHOLOGY TREATMENT NOTE   Patient Name: Ian [name] MRN: [id number] DOB:1961/06/30, 61 y.o., male Today's Date: 06/16/2022  PCP: Bernerd Limbo, MD REFERRING PROVIDER: Ludwig Clarks, DO  END OF SESSION:   End of Session - 06/16/22 1155     Visit Number 6    Number of Visits 17    Date for SLP Re-Evaluation 06/28/22    Authorization Type Medicare/Tricare    SLP Start Time 1150    SLP Stop Time  1230    SLP Time Calculation (min) 40 min    Activity Tolerance Patient tolerated treatment well                Past Medical History:  Diagnosis Date   Anxiety    History of kidney stones    passed   PD (Parkinson's disease) ([place]) 09/10/2013   PONV (postoperative nausea and vomiting)    nausea    Past Surgical History:  Procedure Laterality Date   COLONOSCOPY     MINOR PLACEMENT OF FIDUCIAL N/A 01/31/2017   Procedure: MINOR PLACEMENT OF FIDUCIAL;  Surgeon: Erline Levine, MD;  Location: [place];  Service: Neurosurgery;  Laterality: N/A;  Fiducial placement   none     PULSE GENERATOR IMPLANT Right 02/17/2017   Procedure: Right implantable pulse generator placement;  Surgeon: Erline Levine, MD;  Location: [place];  Service: Neurosurgery;  Laterality: Right;  Bilateral implantable pulse generator placement   SUBTHALAMIC STIMULATOR BATTERY REPLACEMENT N/A 10/19/2018   Procedure: Deep Brain stimulator implantable pulse generator revision;  Surgeon: Erline Levine, MD;  Location: [place];  Service: Neurosurgery;  Laterality: N/A;  Deep Brain stimulator implantable pulse generator revision   SUBTHALAMIC STIMULATOR INSERTION Bilateral 02/10/2017   Procedure: Bilateral Deep brain stimulator placement;  Surgeon: Erline Levine, MD;  Location: [place];  Service: Neurosurgery;  Laterality: Bilateral;  Bilateral Deep brain stimulator placement   UPPER GASTROINTESTINAL ENDOSCOPY     Patient Active Problem List   Diagnosis Date Noted   Closed fracture of phalanx of left fifth  toe 10/19/2021   Tinea versicolor 05/28/2021   Left rotator cuff tear 08/20/2020   Nonallopathic lesion of sacral region 07/25/2018   Nonallopathic lesion of lumbosacral region 07/25/2018   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   Acute bursitis of right shoulder 12/06/2017   Antalgic gait 01/24/2017   Intercostal muscle tear 07/19/2016   PD (Parkinson's disease) ([place]) 06/24/2016   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 No  OBJECTIVE:   TODAY'S TREATMENT:  06-16-22: Pt practiced conversational questions with wife as part of HEP with focus on intentional slow speech. Further education and training conducted today for utilizing intent while speaking in conversation as pt able to successfully utilize on structured tasks. SLP trialed intermittent pausing, increased breath support, and coordination with motor movements to aid fluency in conversation, which was intermittently beneficial. Occasional independent self-corrections implemented to reduce initial sound repetitions.   06-14-22: Pt entered with intermittent rushes in speech and inconsistent  rate of speech. SLP cued slow, intentional speech via written aid, which was occasionally effective. Speak Out! Lesson #10 completed with rare min A for slowed rate on structured tasks. Targeted conversational level speech with occasional to usual cues required to slow rate this  session. Occasional self-corrections exhibited. SLP suggested increased practice in extended conversation at home and in community as pt reports some avoidance of longer, more complex conversation with limited practice obtained.   06-08-22: Reported completion of HEP 1x/day due to busy schedule. Targeted improving vocal quality, maintaining intensity, and reducing rate through progressively difficulty speech tasks using Speak Out! program, lesson #8. ST leads pt through exercises providing rare model prior to pt execution. Averages this date: loud "ah" 90 dB; reading 73 dB; cognitive speech task 74 dB. Rare min A required this date to slow rate during structured practice. Conversational sample of approx 10 minutes, pt averages WNL volume with occasional min-A to be intentional to reduce rate of speech and trial technique to reduce stutter (relaxed exhalation, restart). Good carryover of education re: throat clear alternatives and abdominal breathing exhibited given occasional min A.   06-02-22: Reported completion of HEP 1x/day. Targeted improving vocal quality, maintaining intensity, and reducing rate through progressively difficulty speech tasks using Speak Out! program, lesson #5. ST leads pt through exercises providing rare model prior to pt execution. Averages this date: loud "ah" 93 dB; reading 74 dB; cognitive speech task 75 dB. Rare min A required this date to slow rate during structured practice. Conversational sample of approx 10 minutes, pt averages WNL volume with occasional min-A to be intentional to reduce rate of speech. Education provided re: throat clear alternatives to reduce some reported laryngeal irritation due to throat clearing and abdominal breathing to optimize breath support. Handout provided with pt able to demonstrate return of education with mod I. Pt reportedly pleased with education and training in ST intervention thus far with some improvements in functional communication reported.    05-25-22: Pt returned with Speak Out! Workbook. Educated principles of program and plan to target reducing rate of speech via intentional practice. Targeted improving vocal quality, maintaining intensity, and reducing rate through progressively difficulty speech tasks using Speak Out! program, lesson #1. ST leads pt through exercises providing usual model prior to pt execution. Rare min-A required to achieve target dB this date. Averages this date: loud "ah" 90 dB; reading 74 dB; cognitive speech task 75  dB. Rare min A required this date to slow rate during structured practice. Conversational sample of approx 10 minutes, pt averages WNL volume with occasional min-A to be intentional to reduce rate of speech.  05-03-22: Education on SLP observations, evaluation results, recommendations for skilled ST. Instruction on using intentional speech to control rate, resulting in increased intelligibility. Oral reading practice with usual mod-A for rate.      PATIENT EDUCATION: Education details: see above Person educated: Patient Education method: Education officer, environmental, Verbal cues, and Handouts Education comprehension: verbalized understanding, returned demonstration, verbal cues required, and needs further education     HOME EXERCISE PROGRAM: Speak Out! workbook         GOALS: Goals reviewed with patient? Yes   SHORT TERM GOALS: Target date: 05/31/2022     Pt will maintain WNL rate of speech during structured practice (oral reading, cognitive exercise, etc.), multi-sentence level, with occasional min-A over 2 sessions.  Baseline: 05-25-22, 06-02-22 Goal status: Met   2.  Pt will self-ID rushes in speech, as they occur, in 50% of opportunities  to increase ability to self-correct rate with occasional min-A over 2 sessions.  Baseline:  Goal status: Partially Met (only seen 2 tx visits thus far)   3.  Pt will report completion of dysarthria HEP at least 1x/day over 1 week period with  mod-I Baseline:  Goal status: Met   LONG TERM GOALS: Target date: 06/28/2022     Pt will demonstrate average WNL rate of speech over 18+ minute conversational sample with no more than 4 rushes of speech over sample period, with rare min-A.  Baseline:  Goal status: ongoing   2.  Pt will ID and self-correct rushes in rate of speech in 70% of occurences in structured speech practice (e.g. oral reading) over 2 sessions with rare min-A.  Baseline:  Goal status: ongoing   3.  Pt will report improvement via CPIB PROM by 4 points by d/c, indicative subjective improvement in communicative abilities.  Baseline:  Goal status: ongoing   ASSESSMENT:   CLINICAL IMPRESSION: Patient is a 61 y.o. M who was seen today for dysarthria 2/2 Parkinson's. Pt presents with volume within gross normal limits but intelligibility intermittently impacted primarily by fast rate of speech. Rate is variable, ranging from normal to frequent rushes. During rushes, also demonstrating initial part word repetitions. Conducted ongoing education and training of Speak Out! Principles and dysarthria compensations to optimize carryover of intentional speech to effectively reduce speech rate in conversation. I recommend skilled ST to address motor speech to increase intelligibility and pt's ability to effectively communicate.    OBJECTIVE IMPAIRMENTS  Objective impairments include dysarthria. These impairments are limiting patient from effectively communicating at home and in community.Factors affecting potential to achieve goals and functional outcome are medical prognosis.. Patient will benefit from skilled SLP services to address above impairments and improve overall function.   REHAB POTENTIAL: Good   PLAN: SLP FREQUENCY: 2x/week   SLP DURATION: 8 weeks   PLANNED INTERVENTIONS: SLP instruction and feedback, Compensatory strategies, and Patient/family education   Marzetta Board, [place] 06/16/2022, 12:39 PM

## 2022-06-20 ENCOUNTER — Ambulatory Visit: Payer: Medicare Other | Attending: Family Medicine

## 2022-06-20 ENCOUNTER — Encounter: Payer: Medicare Other | Admitting: Occupational Therapy

## 2022-06-20 DIAGNOSIS — R2681 Unsteadiness on feet: Secondary | ICD-10-CM | POA: Diagnosis present

## 2022-06-20 DIAGNOSIS — R29818 Other symptoms and signs involving the nervous system: Secondary | ICD-10-CM | POA: Insufficient documentation

## 2022-06-20 DIAGNOSIS — R471 Dysarthria and anarthria: Secondary | ICD-10-CM | POA: Insufficient documentation

## 2022-06-20 DIAGNOSIS — R29898 Other symptoms and signs involving the musculoskeletal system: Secondary | ICD-10-CM | POA: Insufficient documentation

## 2022-06-20 DIAGNOSIS — R293 Abnormal posture: Secondary | ICD-10-CM | POA: Insufficient documentation

## 2022-06-20 DIAGNOSIS — R278 Other lack of coordination: Secondary | ICD-10-CM | POA: Diagnosis present

## 2022-06-20 NOTE — Therapy (Signed)
OUTPATIENT SPEECH LANGUAGE PATHOLOGY TREATMENT NOTE   Patient Name: Ian [name] MRN: [id number] DOB:Mar 06, 1961, 61 y.o., male Today's Date: 06/20/2022  PCP: Bernerd Limbo, MD REFERRING PROVIDER: Ludwig Clarks, DO  END OF SESSION:   End of Session - 06/20/22 1101     Visit Number 7    Number of Visits 17    Date for SLP Re-Evaluation 06/28/22    Authorization Type Medicare/Tricare    SLP Start Time 1102    SLP Stop Time  1145    SLP Time Calculation (min) 43 min    Activity Tolerance Patient tolerated treatment well                 Past Medical History:  Diagnosis Date   Anxiety    History of kidney stones    passed   PD (Parkinson's disease) ([place]) 09/10/2013   PONV (postoperative nausea and vomiting)    nausea    Past Surgical History:  Procedure Laterality Date   COLONOSCOPY     MINOR PLACEMENT OF FIDUCIAL N/A 01/31/2017   Procedure: MINOR PLACEMENT OF FIDUCIAL;  Surgeon: Erline Levine, MD;  Location: [facility];  Service: Neurosurgery;  Laterality: N/A;  Fiducial placement   none     PULSE GENERATOR IMPLANT Right 02/17/2017   Procedure: Right implantable pulse generator placement;  Surgeon: Erline Levine, MD;  Location: [place];  Service: Neurosurgery;  Laterality: Right;  Bilateral implantable pulse generator placement   SUBTHALAMIC STIMULATOR BATTERY REPLACEMENT N/A 10/19/2018   Procedure: Deep Brain stimulator implantable pulse generator revision;  Surgeon: Erline Levine, MD;  Location: [place];  Service: Neurosurgery;  Laterality: N/A;  Deep Brain stimulator implantable pulse generator revision   SUBTHALAMIC STIMULATOR INSERTION Bilateral 02/10/2017   Procedure: Bilateral Deep brain stimulator placement;  Surgeon: Erline Levine, MD;  Location: [facility];  Service: Neurosurgery;  Laterality: Bilateral;  Bilateral Deep brain stimulator placement   UPPER GASTROINTESTINAL ENDOSCOPY     Patient Active Problem List   Diagnosis Date Noted   Closed fracture of phalanx of left  fifth toe 10/19/2021   Tinea versicolor 05/28/2021   Left rotator cuff tear 08/20/2020   Nonallopathic lesion of sacral region 07/25/2018   Nonallopathic lesion of lumbosacral region 07/25/2018   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   Acute bursitis of right shoulder 12/06/2017   Antalgic gait 01/24/2017   Intercostal muscle tear 07/19/2016   PD (Parkinson's disease) ([place]) 06/24/2016   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   Nonallopathic lesion of thoracic region 01/25/2016   Nonallopathic lesion-rib cage 01/25/2016   Nonallopathic lesion of cervical region 01/25/2016   Parkinson's disease ([place]) 01/25/2016   Lateral epicondylitis 12/01/2014   REM behavioral disorder 08/27/2014   Paralysis agitans ([place]) 10/18/2013    ONSET DATE: dx Parkinson's 2014, DBS 2017, referred for ST 04/28/2022  REFERRING DIAG: G20 (ICD-10-CM) - Parkinson's disease  THERAPY DIAG: Dysarthria and anarthria  Rationale for Evaluation and Treatment Rehabilitation  SUBJECTIVE: "overall better" re: speech rate  PAIN:  Are you having pain? No  OBJECTIVE:   TODAY'S TREATMENT:  06-20-22: Pt endorsed some benefit and carryover of slower rate with recommended increased conversational practice with wife over weekend. Speak Out! Lesson #12 completed with rare min A for slowed rate on structured tasks. Targeted conversational level speech with occasional non-verbal cues required to slow rate this session. Occasional self-corrections exhibited. SLP continues to recommend increased practice in extended conversation at home and in community as pt reports some avoidance of longer, more complex conversation with inconsistent  practice obtained.   06-16-22: Pt practiced conversational questions with wife as part of HEP with focus on intentional slow speech. Further education and training conducted today for utilizing intent while speaking in conversation as pt able to successfully utilize on structured tasks. SLP trialed  intermittent pausing, increased breath support, and coordination with motor movements to aid fluency in conversation, which was intermittently beneficial. Occasional independent self-corrections implemented to reduce initial sound repetitions.   06-14-22: Pt entered with intermittent rushes in speech and inconsistent rate of speech. SLP cued slow, intentional speech via written aid, which was occasionally effective. Speak Out! Lesson #10 completed with rare min A for slowed rate on structured tasks. Targeted conversational level speech with occasional to usual cues required to slow rate this session. Occasional self-corrections exhibited. SLP suggested increased practice in extended conversation at home and in community as pt reports some avoidance of longer, more complex conversation with limited practice obtained.   06-08-22: Reported completion of HEP 1x/day due to busy schedule. Targeted improving vocal quality, maintaining intensity, and reducing rate through progressively difficulty speech tasks using Speak Out! program, lesson #8. ST leads pt through exercises providing rare model prior to pt execution. Averages this date: loud "ah" 90 dB; reading 73 dB; cognitive speech task 74 dB. Rare min A required this date to slow rate during structured practice. Conversational sample of approx 10 minutes, pt averages WNL volume with occasional min-A to be intentional to reduce rate of speech and trial technique to reduce stutter (relaxed exhalation, restart). Good carryover of education re: throat clear alternatives and abdominal breathing exhibited given occasional min A.   06-02-22: Reported completion of HEP 1x/day. Targeted improving vocal quality, maintaining intensity, and reducing rate through progressively difficulty speech tasks using Speak Out! program, lesson #5. ST leads pt through exercises providing rare model prior to pt execution. Averages this date: loud "ah" 93 dB; reading 74 dB; cognitive speech  task 75 dB. Rare min A required this date to slow rate during structured practice. Conversational sample of approx 10 minutes, pt averages WNL volume with occasional min-A to be intentional to reduce rate of speech. Education provided re: throat clear alternatives to reduce some reported laryngeal irritation due to throat clearing and abdominal breathing to optimize breath support. Handout provided with pt able to demonstrate return of education with mod I. Pt reportedly pleased with education and training in ST intervention thus far with some improvements in functional communication reported.     PATIENT EDUCATION: Education details: see above Person educated: Patient Education method: Education officer, environmental, Verbal cues, and Handouts Education comprehension: verbalized understanding, returned demonstration, verbal cues required, and needs further education     HOME EXERCISE PROGRAM: Speak Out! workbook     GOALS: Goals reviewed with patient? Yes   SHORT TERM GOALS: Target date: 05/31/2022     Pt will maintain WNL rate of speech during structured practice (oral reading, cognitive exercise, etc.), multi-sentence level, with occasional min-A over 2 sessions.  Baseline: 05-25-22, 06-02-22 Goal status: Met   2.  Pt will self-ID rushes in speech, as they occur, in 50% of opportunities to increase ability to self-correct rate with occasional min-A over 2 sessions.  Baseline:  Goal status: Partially Met (only seen 2 tx visits thus far)   3.  Pt will report completion of dysarthria HEP at least 1x/day over 1 week period with mod-I Baseline:  Goal status: Met   LONG TERM GOALS: Target date: 06/28/2022     Pt will demonstrate  average WNL rate of speech over 18+ minute conversational sample with no more than 4 rushes of speech over sample period, with rare min-A.  Baseline:  Goal status: ongoing   2.  Pt will ID and self-correct rushes in rate of speech in 70% of occurences in structured  speech practice (e.g. oral reading) over 2 sessions with rare min-A.  Baseline: 06-20-22 Goal status: ongoing   3.  Pt will report improvement via CPIB PROM by 4 points by d/c, indicative subjective improvement in communicative abilities.  Baseline:  Goal status: ongoing   ASSESSMENT:   CLINICAL IMPRESSION: Patient is a 61 y.o. M who was seen today for dysarthria 2/2 Parkinson's. Pt presents with volume within gross normal limits but intelligibility intermittently impacted primarily by fast rate of speech. Rate is variable, ranging from normal to frequent rushes. During rushes, also demonstrating initial part word repetitions. Conducted ongoing education and training of Speak Out! Principles and dysarthria compensations to optimize carryover of intentional speech to effectively reduce speech rate in conversation with rare to usual cues required based on daily variations. I recommend skilled ST to address motor speech to increase intelligibility and pt's ability to effectively communicate.    OBJECTIVE IMPAIRMENTS  Objective impairments include dysarthria. These impairments are limiting patient from effectively communicating at home and in community.Factors affecting potential to achieve goals and functional outcome are medical prognosis.. Patient will benefit from skilled SLP services to address above impairments and improve overall function.   REHAB POTENTIAL: Good   PLAN: SLP FREQUENCY: 2x/week   SLP DURATION: 8 weeks   PLANNED INTERVENTIONS: SLP instruction and feedback, Compensatory strategies, and Patient/family education   Marzetta Board, CCC-SLP 06/20/2022, 11:02 AM

## 2022-06-20 NOTE — Therapy (Signed)
OUTPATIENT OCCUPATIONAL THERAPY PARKINSON'S TREATMENT  Patient Name: Ian [name] MRN: [id number] DOB:1961/12/03, 61 y.o., male Today's Date: 06/23/2022  PCP: Dr. Bernerd Limbo REFERRING PROVIDER: Dr. Wells Guiles [name]   OT End of Session - 06/23/22 0824     Visit Number 8    Number of Visits 13    Date for OT Re-Evaluation 07/02/22    Authorization Type Medicare & Tricare for Life--Covered 100%    Authorization - Visit Number 8    Authorization - Number of Visits 10    Progress Note Due on Visit 10    OT Start Time 0804    OT Stop Time 0845    OT Time Calculation (min) 41 min    Activity Tolerance Patient tolerated treatment well    Behavior During Therapy [facility] for tasks assessed/performed                   Past Medical History:  Diagnosis Date   Anxiety    History of kidney stones    passed   PD (Parkinson's disease) ([place]) 09/10/2013   PONV (postoperative nausea and vomiting)    nausea    Past Surgical History:  Procedure Laterality Date   COLONOSCOPY     MINOR PLACEMENT OF FIDUCIAL N/A 01/31/2017   Procedure: MINOR PLACEMENT OF FIDUCIAL;  Surgeon: Erline Levine, MD;  Location: [place];  Service: Neurosurgery;  Laterality: N/A;  Fiducial placement   none     PULSE GENERATOR IMPLANT Right 02/17/2017   Procedure: Right implantable pulse generator placement;  Surgeon: Erline Levine, MD;  Location: [place];  Service: Neurosurgery;  Laterality: Right;  Bilateral implantable pulse generator placement   SUBTHALAMIC STIMULATOR BATTERY REPLACEMENT N/A 10/19/2018   Procedure: Deep Brain stimulator implantable pulse generator revision;  Surgeon: Erline Levine, MD;  Location: [place];  Service: Neurosurgery;  Laterality: N/A;  Deep Brain stimulator implantable pulse generator revision   SUBTHALAMIC STIMULATOR INSERTION Bilateral 02/10/2017   Procedure: Bilateral Deep brain stimulator placement;  Surgeon: Erline Levine, MD;  Location: [place];  Service: Neurosurgery;  Laterality: Bilateral;   Bilateral Deep brain stimulator placement   UPPER GASTROINTESTINAL ENDOSCOPY     Patient Active Problem List   Diagnosis Date Noted   Closed fracture of phalanx of left fifth toe 10/19/2021   Tinea versicolor 05/28/2021   Left rotator cuff tear 08/20/2020   Nonallopathic lesion of sacral region 07/25/2018   Nonallopathic lesion of lumbosacral region 07/25/2018   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   Acute bursitis of right shoulder 12/06/2017   Antalgic gait 01/24/2017   Intercostal muscle tear 07/19/2016   PD (Parkinson's disease) ([place]) 06/24/2016   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   Nonallopathic lesion of thoracic region 01/25/2016   Nonallopathic lesion-rib cage 01/25/2016   Nonallopathic lesion of cervical region 01/25/2016   Parkinson's disease ([place]) 01/25/2016   Lateral epicondylitis 12/01/2014   REM behavioral disorder 08/27/2014   Paralysis agitans ([place]) 10/18/2013    ONSET DATE: PD screen date 04/21/22  REFERRING DIAG:  G20 (ICD-10-CM) - Parkinson's disease ([place])   THERAPY DIAG:  Other symptoms and signs involving the nervous system  Other lack of coordination  Abnormal posture  Unsteadiness on feet  Other symptoms and signs involving the musculoskeletal system  SUBJECTIVE:   SUBJECTIVE STATEMENT: Pt reports that he played golf Friday, and played the best game all year.   Pt accompanied by: self  PERTINENT HISTORY:  bilateral STN DBS 01/2017, hx of partial tear of L rotator cuff, hx of bilateral  shoulder impingement per pt, hx of slipped rib syndrome, anxiety  PRECAUTIONS: bilateral STN DBS 01/2017  PAIN:  Are you having pain? No  PATIENT GOALS  improve writing, improve golf (ball on tee, putting--speed), improve putting on necklace, cutting food, taking out contact.  OBJECTIVE:   TODAY'S TREATMENT:    Sliding cards across table using PWR! Hands with each hand and then with each finger with min v.c. for deliberate movements, and  timing.  Closed-chain shoulder flex (floor>overhead) with sit>stand, and then diagonals to each side with side step up (3" step) with min v.c. for large amplitude and timing.   Step up (3" step) alternating LEs and performed PWR! Up with red theraband, then PWR! Step (on 3" step) to each side with red theraband resistance, min v.c. for timing and head position.   Standing, tossing/catching ball with BUEs with use of PWR! Hands/large amplitude, deliberate movements.   Reassessed functional activities using Functional Tasks Recording Form--pt rated as follows:  Writing more legibility, putting speed, putting contacts in/out:   3/7 somewhat difficult  Putting golf ball on tee, putting on necklace, and cutting food:  2/7 minimally difficult    PATIENT EDUCATION: Education details:  continue to focus on timing, deliberate movements, large amplitude.  Stop-restart deliberately if has to make a 2nd attempt for task/activity Person educated: Patient Education method: Explanation, demonstration, v.c  Education comprehension: verbalized understanding, returned demo   HOME EXERCISE PROGRAM: 05/23/22-PWR! Hands basic 4 review, pt already has handout 05/25/22- PWR! Quadraped basic 4 review     GOALS: Potential Goals reviewed with patient? Yes  SHORT TERM GOALS: Target date: 05/31/2022    Pt will be independent with updated PD-specific HEP with emphasis on timing of movement and large amplitude. Goal status: MET  2.  Pt will verbalize understanding of ways to decr risk of future complications related to PD. Goal status: INITIAL    LONG TERM GOALS: Target date: 06/14/2022    Pt will verbalize understanding of adaptive strategies to incr ease with ADLs/IADLs and decr risk of future complications (including putting on necklace, writing, cutting food, golf, putting contacts in/out, and improved timing of movement). Goal status: INITIAL  2.  Pt will be able to write at least 4 sentences/lines  with 100% legibility and no significant decr in size. Goal status: MET 06/13/22  3.  Pt will improve bilateral coordination and functional reaching for ADLs/IADLs as shown by improving score on box and blocks test by 4. Baseline: R-54, L-53 blocks Goal status: ONGOING  06/23/22:  R-53blocks, L-52blocks  4.  Pt will reports putting golf ball on tee and putting golf ball as only somewhat difficult (3/7). Goal status: MET.  06/23/22:  2/7 minimally difficult   ASSESSMENT:  CLINICAL IMPRESSION: Pt is progressing towards goals and responds well to cueing for timing and pauses between movements and needs less cueing overall.    PERFORMANCE DEFICITS in functional skills including ADLs, IADLs, coordination, dexterity, tone, ROM, FMC, GMC, balance, and UE functional use and psychosocial skills including habits.   IMPAIRMENTS are limiting patient from ADLs, IADLs, and leisure.   COMORBIDITIES may have co-morbidities  that affects occupational performance. Patient will benefit from skilled OT to address above impairments and improve overall function.  MODIFICATION OR ASSISTANCE TO COMPLETE EVALUATION: Min-Moderate modification of tasks or assist with assess necessary to complete an evaluation.  OT OCCUPATIONAL PROFILE AND HISTORY: Detailed assessment: Review of records and additional review of physical, cognitive, psychosocial history related to current functional performance.  CLINICAL DECISION MAKING: Moderate - several treatment options, min-mod task modification necessary  REHAB POTENTIAL: Good  EVALUATION COMPLEXITY: Moderate   PLAN: OT FREQUENCY: 2x/week  OT DURATION: 6 weeks or 12 visits over 8 wks due to scheduling; however, anticipate that pt may only need 9 visits  PLANNED INTERVENTIONS: self care/ADL training, therapeutic exercise, therapeutic activity, neuromuscular re-education, manual therapy, passive range of motion, balance training, functional mobility training, moist heat,  patient/family education, energy conservation, coping strategies training, and DME and/or AE instructions  RECOMMENDED OTHER SERVICES: speech therapy--eval today  CONSULTED AND AGREED WITH PLAN OF CARE: Patient  PLAN FOR NEXT SESSION:  possible d/c next session; continue with ADL strategies, functional reach with coordination with emphasis on timing and stop/start of each movement, incorporate dual task and bilateral hand coordination, ways to decr risk of future complications   Ian [name], OTR/L 06/23/2022, 8:24 AM   [facility] [address] [place], [place]  [address] [phone] phone [phone] 06/23/22 8:24 AM

## 2022-06-23 ENCOUNTER — Ambulatory Visit: Payer: Medicare Other | Admitting: Occupational Therapy

## 2022-06-23 ENCOUNTER — Ambulatory Visit: Payer: Medicare Other

## 2022-06-23 ENCOUNTER — Encounter: Payer: Self-pay | Admitting: Occupational Therapy

## 2022-06-23 DIAGNOSIS — R471 Dysarthria and anarthria: Secondary | ICD-10-CM | POA: Diagnosis not present

## 2022-06-23 DIAGNOSIS — R293 Abnormal posture: Secondary | ICD-10-CM

## 2022-06-23 DIAGNOSIS — R278 Other lack of coordination: Secondary | ICD-10-CM

## 2022-06-23 DIAGNOSIS — R29898 Other symptoms and signs involving the musculoskeletal system: Secondary | ICD-10-CM

## 2022-06-23 DIAGNOSIS — R2681 Unsteadiness on feet: Secondary | ICD-10-CM

## 2022-06-23 DIAGNOSIS — R29818 Other symptoms and signs involving the nervous system: Secondary | ICD-10-CM

## 2022-06-23 NOTE — Therapy (Signed)
OUTPATIENT SPEECH LANGUAGE PATHOLOGY TREATMENT NOTE   Patient Name: Ian [name] MRN: [id number] DOB:[birth year], 61 y.o., male Today's Date: 06/23/2022  PCP: Bernerd Limbo, MD REFERRING PROVIDER: Ludwig Clarks, DO  END OF SESSION:   End of Session - 06/23/22 0830     Visit Number 8    Number of Visits 17    Date for SLP Re-Evaluation 06/28/22    Authorization Type Medicare/Tricare    SLP Start Time 0847    SLP Stop Time  0927    SLP Time Calculation (min) 40 min    Activity Tolerance Patient tolerated treatment well                 Past Medical History:  Diagnosis Date   Anxiety    History of kidney stones    passed   PD (Parkinson's disease) ([place]) 09/10/2013   PONV (postoperative nausea and vomiting)    nausea    Past Surgical History:  Procedure Laterality Date   COLONOSCOPY     MINOR PLACEMENT OF FIDUCIAL N/A 01/31/2017   Procedure: MINOR PLACEMENT OF FIDUCIAL;  Surgeon: Erline Levine, MD;  Location: [facility];  Service: Neurosurgery;  Laterality: N/A;  Fiducial placement   none     PULSE GENERATOR IMPLANT Right 02/17/2017   Procedure: Right implantable pulse generator placement;  Surgeon: Erline Levine, MD;  Location: [facility];  Service: Neurosurgery;  Laterality: Right;  Bilateral implantable pulse generator placement   SUBTHALAMIC STIMULATOR BATTERY REPLACEMENT N/A 10/19/2018   Procedure: Deep Brain stimulator implantable pulse generator revision;  Surgeon: Erline Levine, MD;  Location: [facility];  Service: Neurosurgery;  Laterality: N/A;  Deep Brain stimulator implantable pulse generator revision   SUBTHALAMIC STIMULATOR INSERTION Bilateral 02/10/2017   Procedure: Bilateral Deep brain stimulator placement;  Surgeon: Erline Levine, MD;  Location: [facility];  Service: Neurosurgery;  Laterality: Bilateral;  Bilateral Deep brain stimulator placement   UPPER GASTROINTESTINAL ENDOSCOPY     Patient Active Problem List   Diagnosis Date Noted   Closed fracture of phalanx of left  fifth toe 10/19/2021   Tinea versicolor 05/28/2021   Left rotator cuff tear 08/20/2020   Nonallopathic lesion of sacral region 07/25/2018   Nonallopathic lesion of lumbosacral region 07/25/2018   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   Acute bursitis of right shoulder 12/06/2017   Antalgic gait 01/24/2017   Intercostal muscle tear 07/19/2016   PD (Parkinson's disease) ([place]) 06/24/2016   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   Nonallopathic lesion of thoracic region 01/25/2016   Nonallopathic lesion-rib cage 01/25/2016   Nonallopathic lesion of cervical region 01/25/2016   Parkinson's disease ([place]) 01/25/2016   Lateral epicondylitis 12/01/2014   REM behavioral disorder 08/27/2014   Paralysis agitans ([place]) 10/18/2013    ONSET DATE: dx Parkinson's 2014, DBS 2017, referred for ST 04/28/2022  REFERRING DIAG: G20 (ICD-10-CM) - Parkinson's disease  THERAPY DIAG: Dysarthria and anarthria  Rationale for Evaluation and Treatment Rehabilitation  SUBJECTIVE: "It's going pretty good" re: slow rate  PAIN:  Are you having pain? No  OBJECTIVE:   TODAY'S TREATMENT:  06-23-22: Continued to focus on intentional slow rate in conversation, with pt demonstrating increased self-awareness and ability to self-correct with occasional min A from SLP. Generated additional practice opportunities for patient to engage in intentional slow rate of conversation at home to maximize carryover of targeted techniques.   06-20-22: Pt endorsed some benefit and carryover of slower rate with recommended increased conversational practice with wife over weekend. Speak Out! Lesson #12 completed with rare  min A for slowed rate on structured tasks. Targeted conversational level speech with occasional non-verbal cues required to slow rate this session. Occasional self-corrections exhibited. SLP continues to recommend increased practice in extended conversation at home and in community as pt reports some avoidance of longer, more  complex conversation with inconsistent practice obtained.   06-16-22: Pt practiced conversational questions with wife as part of HEP with focus on intentional slow speech. Further education and training conducted today for utilizing intent while speaking in conversation as pt able to successfully utilize on structured tasks. SLP trialed intermittent pausing, increased breath support, and coordination with motor movements to aid fluency in conversation, which was intermittently beneficial. Occasional independent self-corrections implemented to reduce initial sound repetitions.   06-14-22: Pt entered with intermittent rushes in speech and inconsistent rate of speech. SLP cued slow, intentional speech via written aid, which was occasionally effective. Speak Out! Lesson #10 completed with rare min A for slowed rate on structured tasks. Targeted conversational level speech with occasional to usual cues required to slow rate this session. Occasional self-corrections exhibited. SLP suggested increased practice in extended conversation at home and in community as pt reports some avoidance of longer, more complex conversation with limited practice obtained.   06-08-22: Reported completion of HEP 1x/day due to busy schedule. Targeted improving vocal quality, maintaining intensity, and reducing rate through progressively difficulty speech tasks using Speak Out! program, lesson #8. ST leads pt through exercises providing rare model prior to pt execution. Averages this date: loud "ah" 90 dB; reading 73 dB; cognitive speech task 74 dB. Rare min A required this date to slow rate during structured practice. Conversational sample of approx 10 minutes, pt averages WNL volume with occasional min-A to be intentional to reduce rate of speech and trial technique to reduce stutter (relaxed exhalation, restart). Good carryover of education re: throat clear alternatives and abdominal breathing exhibited given occasional min A.     PATIENT EDUCATION: Education details: see above Person educated: Patient Education method: Education officer, environmental, Verbal cues, and Handouts Education comprehension: verbalized understanding, returned demonstration, verbal cues required, and needs further education     HOME EXERCISE PROGRAM: Speak Out! workbook     GOALS: Goals reviewed with patient? Yes   SHORT TERM GOALS: Target date: 05/31/2022     Pt will maintain WNL rate of speech during structured practice (oral reading, cognitive exercise, etc.), multi-sentence level, with occasional min-A over 2 sessions.  Baseline: 05-25-22, 06-02-22 Goal status: Met   2.  Pt will self-ID rushes in speech, as they occur, in 50% of opportunities to increase ability to self-correct rate with occasional min-A over 2 sessions.  Baseline:  Goal status: Partially Met (only seen 2 tx visits thus far)   3.  Pt will report completion of dysarthria HEP at least 1x/day over 1 week period with mod-I Baseline:  Goal status: Met   LONG TERM GOALS: Target date: 06/28/2022     Pt will demonstrate average WNL rate of speech over 18+ minute conversational sample with no more than 4 rushes of speech over sample period, with rare min-A.  Baseline:  Goal status: ongoing   2.  Pt will ID and self-correct rushes in rate of speech in 70% of occurences in structured speech practice (e.g. oral reading) over 2 sessions with rare min-A.  Baseline: 06-20-22 Goal status: ongoing   3.  Pt will report improvement via CPIB PROM by 4 points by d/c, indicative subjective improvement in communicative abilities.  Baseline:  Goal  status: ongoing   ASSESSMENT:   CLINICAL IMPRESSION: Patient is a 61 y.o. M who was seen today for dysarthria 2/2 Parkinson's. Pt presents with volume within gross normal limits but intelligibility intermittently impacted primarily by fast rate of speech. Rate is variable, ranging from normal to frequent rushes. During rushes, also  demonstrating initial part word repetitions. Conducted ongoing education and training of Speak Out! Principles and dysarthria compensations to optimize carryover of intentional speech to effectively reduce speech rate in conversation with rare to occasional cues required based on daily variations. I recommend skilled ST to address motor speech to increase intelligibility and pt's ability to effectively communicate.    OBJECTIVE IMPAIRMENTS  Objective impairments include dysarthria. These impairments are limiting patient from effectively communicating at home and in community.Factors affecting potential to achieve goals and functional outcome are medical prognosis.. Patient will benefit from skilled SLP services to address above impairments and improve overall function.   REHAB POTENTIAL: Good   PLAN: SLP FREQUENCY: 2x/week   SLP DURATION: 8 weeks   PLANNED INTERVENTIONS: SLP instruction and feedback, Compensatory strategies, and Patient/family education   Marzetta Board, [place] 06/23/2022, 9:28 AM

## 2022-06-26 NOTE — Therapy (Signed)
OUTPATIENT OCCUPATIONAL THERAPY PARKINSON'S TREATMENT  Patient Name: Ian [name] MRN: [id number] DOB:[birth year], 61 y.o., male Today's Date: 06/26/2022  PCP: Dr. Bernerd Limbo REFERRING PROVIDER: Dr. Wells Guiles [name]           Past Medical History:  Diagnosis Date   Anxiety    History of kidney stones    passed   PD (Parkinson's disease) ([place]) 09/10/2013   PONV (postoperative nausea and vomiting)    nausea    Past Surgical History:  Procedure Laterality Date   COLONOSCOPY     MINOR PLACEMENT OF FIDUCIAL N/A 01/31/2017   Procedure: MINOR PLACEMENT OF FIDUCIAL;  Surgeon: Erline Levine, MD;  Location: [facility];  Service: Neurosurgery;  Laterality: N/A;  Fiducial placement   none     PULSE GENERATOR IMPLANT Right 02/17/2017   Procedure: Right implantable pulse generator placement;  Surgeon: Erline Levine, MD;  Location: [facility];  Service: Neurosurgery;  Laterality: Right;  Bilateral implantable pulse generator placement   SUBTHALAMIC STIMULATOR BATTERY REPLACEMENT N/A 10/19/2018   Procedure: Deep Brain stimulator implantable pulse generator revision;  Surgeon: Erline Levine, MD;  Location: [facility];  Service: Neurosurgery;  Laterality: N/A;  Deep Brain stimulator implantable pulse generator revision   SUBTHALAMIC STIMULATOR INSERTION Bilateral 02/10/2017   Procedure: Bilateral Deep brain stimulator placement;  Surgeon: Erline Levine, MD;  Location: [facility];  Service: Neurosurgery;  Laterality: Bilateral;  Bilateral Deep brain stimulator placement   UPPER GASTROINTESTINAL ENDOSCOPY     Patient Active Problem List   Diagnosis Date Noted   Closed fracture of phalanx of left fifth toe 10/19/2021   Tinea versicolor 05/28/2021   Left rotator cuff tear 08/20/2020   Nonallopathic lesion of sacral region 07/25/2018   Nonallopathic lesion of lumbosacral region 07/25/2018   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   Acute bursitis of right shoulder 12/06/2017   Antalgic gait 01/24/2017   Intercostal  muscle tear 07/19/2016   PD (Parkinson's disease) ([place]) 06/24/2016   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  SUBJECTIVE:   SUBJECTIVE STATEMENT: *** Pt reports that he played golf Friday, and played the best game all year.   Pt accompanied by: self  PERTINENT HISTORY:  bilateral STN DBS 01/2017, hx of partial tear of L rotator cuff, hx of bilateral shoulder impingement per pt, hx of slipped rib syndrome, anxiety  PRECAUTIONS: bilateral STN DBS 01/2017  PAIN:  Are you having pain? No  PATIENT GOALS  improve writing, improve golf (ball on tee, putting--speed), improve putting on necklace, cutting food, taking out contact.  OBJECTIVE:   TODAY'S TREATMENT:    *** Sliding cards across table using PWR! Hands with each hand and then with each finger with min v.c. for deliberate movements, and timing.  Closed-chain shoulder flex (floor>overhead) with sit>stand, and then diagonals to each side with side step up (3" step) with min v.c. for large amplitude and timing.   Step up (3" step) alternating LEs and performed PWR! Up with red theraband, then PWR! Step (on 3" step) to each side with red theraband resistance, min v.c. for timing and head position.   Standing,  tossing/catching ball with BUEs with use of PWR! Hands/large amplitude, deliberate movements.   Reassessed functional activities using Functional Tasks Recording Form--pt rated as follows:  Writing more legibility, putting speed, putting contacts in/out:   3/7 somewhat difficult  Putting golf ball on  tee, putting on necklace, and cutting food:  2/7 minimally difficult    PATIENT EDUCATION: Education details:  *** continue to focus on timing, deliberate movements, large amplitude.  Stop-restart deliberately if has to make a 2nd attempt for task/activity Person educated: Patient Education method: Explanation, demonstration, v.c  Education comprehension: verbalized understanding, returned demo   HOME EXERCISE PROGRAM: 05/23/22-PWR! Hands basic 4 review, pt already has handout 05/25/22- PWR! Quadraped basic 4 review     GOALS: Potential Goals reviewed with patient? Yes  SHORT TERM GOALS: Target date: 05/31/2022    Pt will be independent with updated PD-specific HEP with emphasis on timing of movement and large amplitude. Goal status: MET  2.  Pt will verbalize understanding of ways to decr risk of future complications related to PD. Goal status: INITIAL    LONG TERM GOALS: Target date: 06/14/2022    Pt will verbalize understanding of adaptive strategies to incr ease with ADLs/IADLs and decr risk of future complications (including putting on necklace, writing, cutting food, golf, putting contacts in/out, and improved timing of movement). Goal status: INITIAL  2.  Pt will be able to write at least 4 sentences/lines with 100% legibility and no significant decr in size. Goal status: MET 06/13/22  3.  Pt will improve bilateral coordination and functional reaching for ADLs/IADLs as shown by improving score on box and blocks test by 4. Baseline: R-54, L-53 blocks Goal status: ONGOING  06/23/22:  R-53blocks, L-52blocks  4.  Pt will reports putting golf ball on tee and putting golf ball as only somewhat difficult (3/7). Goal status: MET.  06/23/22:  2/7 minimally difficult   ASSESSMENT:  CLINICAL IMPRESSION: *** Pt is progressing towards goals and responds well to cueing for timing and pauses between movements and needs less cueing overall.    PERFORMANCE DEFICITS in functional skills  including ADLs, IADLs, coordination, dexterity, tone, ROM, FMC, GMC, balance, and UE functional use and psychosocial skills including habits.   IMPAIRMENTS are limiting patient from ADLs, IADLs, and leisure.   COMORBIDITIES may have co-morbidities  that affects occupational performance. Patient will benefit from skilled OT to address above impairments and improve overall function.  MODIFICATION OR ASSISTANCE TO COMPLETE EVALUATION: Min-Moderate modification of tasks or assist with assess necessary to complete an evaluation.  OT OCCUPATIONAL PROFILE AND HISTORY: Detailed assessment: Review of records and additional review of physical, cognitive, psychosocial history related to current functional performance.  CLINICAL DECISION MAKING: Moderate - several treatment options, min-mod task modification necessary  REHAB POTENTIAL: Good  EVALUATION COMPLEXITY: Moderate   PLAN: OT FREQUENCY: 2x/week  OT DURATION: 6 weeks or 12 visits over 8 wks due to scheduling; however, anticipate that pt may only need 9 visits  PLANNED INTERVENTIONS: self care/ADL training, therapeutic exercise, therapeutic activity, neuromuscular re-education, manual therapy, passive range of motion, balance training, functional mobility training, moist heat, patient/family education, energy conservation, coping strategies training, and DME and/or AE instructions  RECOMMENDED OTHER SERVICES: speech therapy--eval today  CONSULTED AND AGREED WITH PLAN OF CARE: Patient  PLAN FOR NEXT SESSION:  *** possible d/c next session; continue with ADL strategies, functional reach with coordination with emphasis on timing and stop/start of each movement, incorporate dual task and bilateral hand coordination, ways to  decr risk of future complications  OCCUPATIONAL THERAPY DISCHARGE SUMMARY  Visits from Start of Care: ***  Current functional level related to goals / functional outcomes: See above   Remaining deficits: Bradykinesia,  rigidity, decr coordination, abnormal posture, decr balance/functional mobility, timing deficits   Education / Equipment: Pt was instructed in the following:  PD-specific HEP, adaptive strategies for ADLs/IADLs, ways to prevent future complications.  Pt verbalized understanding of all education provided.   Plan: Patient agrees to discharge.  Patient goals were *** met. Patient is being discharged due to being pleased with the current functional level.  Pt would benefit from occupational therapy screen in approx 7-8 months to assess for need for further therapy/functional changes due to progressive nature of diagnosis.    [facility], OTR/L 06/26/2022, 10:05 AM   [facility] [address] [place], [place]  [address] [phone] phone [phone] 06/26/22 10:05 AM

## 2022-06-27 ENCOUNTER — Ambulatory Visit: Payer: Medicare Other

## 2022-06-27 ENCOUNTER — Encounter: Payer: Self-pay | Admitting: Occupational Therapy

## 2022-06-27 ENCOUNTER — Ambulatory Visit: Payer: Medicare Other | Admitting: Occupational Therapy

## 2022-06-27 DIAGNOSIS — R29818 Other symptoms and signs involving the nervous system: Secondary | ICD-10-CM

## 2022-06-27 DIAGNOSIS — R471 Dysarthria and anarthria: Secondary | ICD-10-CM | POA: Diagnosis not present

## 2022-06-27 DIAGNOSIS — R29898 Other symptoms and signs involving the musculoskeletal system: Secondary | ICD-10-CM

## 2022-06-27 DIAGNOSIS — R2681 Unsteadiness on feet: Secondary | ICD-10-CM

## 2022-06-27 DIAGNOSIS — R278 Other lack of coordination: Secondary | ICD-10-CM

## 2022-06-27 DIAGNOSIS — R293 Abnormal posture: Secondary | ICD-10-CM

## 2022-06-27 NOTE — Therapy (Signed)
OUTPATIENT SPEECH LANGUAGE PATHOLOGY TREATMENT NOTE (RECERT)   Patient Name: Lajuan Kovaleski MRN: [id number] DOB:05-18-1961, 61 y.o., male Today's Date: 06/27/2022  PCP: Bernerd Limbo, MD REFERRING PROVIDER: Ludwig Clarks, DO  END OF SESSION:   End of Session - 06/27/22 0939     Visit Number 9    Number of Visits 17    Date for SLP Re-Evaluation 65/68/12   recert for 4 additional weeks due to scheduling   Authorization Type Medicare/Tricare    SLP Start Time 1015    SLP Stop Time  1056    SLP Time Calculation (min) 41 min    Activity Tolerance Patient tolerated treatment well                  Past Medical History:  Diagnosis Date   Anxiety    History of kidney stones    passed   PD (Parkinson's disease) ([place]) 09/10/2013   PONV (postoperative nausea and vomiting)    nausea    Past Surgical History:  Procedure Laterality Date   COLONOSCOPY     MINOR PLACEMENT OF FIDUCIAL N/A 01/31/2017   Procedure: MINOR PLACEMENT OF FIDUCIAL;  Surgeon: Erline Levine, MD;  Location: [facility];  Service: Neurosurgery;  Laterality: N/A;  Fiducial placement   none     PULSE GENERATOR IMPLANT Right 02/17/2017   Procedure: Right implantable pulse generator placement;  Surgeon: Erline Levine, MD;  Location: [facility];  Service: Neurosurgery;  Laterality: Right;  Bilateral implantable pulse generator placement   SUBTHALAMIC STIMULATOR BATTERY REPLACEMENT N/A 10/19/2018   Procedure: Deep Brain stimulator implantable pulse generator revision;  Surgeon: Erline Levine, MD;  Location: [facility];  Service: Neurosurgery;  Laterality: N/A;  Deep Brain stimulator implantable pulse generator revision   SUBTHALAMIC STIMULATOR INSERTION Bilateral 02/10/2017   Procedure: Bilateral Deep brain stimulator placement;  Surgeon: Erline Levine, MD;  Location: [facility];  Service: Neurosurgery;  Laterality: Bilateral;  Bilateral Deep brain stimulator placement   UPPER GASTROINTESTINAL ENDOSCOPY     Patient Active Problem List    Diagnosis Date Noted   Closed fracture of phalanx of left fifth toe 10/19/2021   Tinea versicolor 05/28/2021   Left rotator cuff tear 08/20/2020   Nonallopathic lesion of sacral region 07/25/2018   Nonallopathic lesion of lumbosacral region 07/25/2018   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   Acute bursitis of right shoulder 12/06/2017   Antalgic gait 01/24/2017   Intercostal muscle tear 07/19/2016   PD (Parkinson's disease) ([place]) 06/24/2016   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   Nonallopathic lesion of thoracic region 01/25/2016   Nonallopathic lesion-rib cage 01/25/2016   Nonallopathic lesion of cervical region 01/25/2016   Parkinson's disease ([place]) 01/25/2016   Lateral epicondylitis 12/01/2014   REM behavioral disorder 08/27/2014   Paralysis agitans ([place]) 10/18/2013    ONSET DATE: dx Parkinson's 2014, DBS 2017, referred for ST 04/28/2022  REFERRING DIAG: G20 (ICD-10-CM) - Parkinson's disease  THERAPY DIAG: Dysarthria and anarthria  Rationale for Evaluation and Treatment Rehabilitation  SUBJECTIVE: "I graduated from OT"   PAIN:  Are you having pain? No  OBJECTIVE:   TODAY'S TREATMENT:  06-27-22: Pt enters with rare rushes in speech today. Pt able to recognize rapid rate or rushes in speech with rare min A in short structured conversation. Self-awareness, recognition, and re-attempts become more frequent given SLP education and training. Pt aware how emotions, medications, and fatigue factor into speech production and able to compensate appropriately with occasional min A. SLP continues to educate patient on seeking  opportunities to practice conversational level speech to optimize carryover of intentional slow rate of speech.   06-23-22: Continued to focus on intentional slow rate in conversation, with pt demonstrating increased self-awareness and ability to self-correct with occasional min A from SLP. Generated additional practice opportunities for patient to engage in  intentional slow rate of conversation at home to maximize carryover of targeted techniques.   06-20-22: Pt endorsed some benefit and carryover of slower rate with recommended increased conversational practice with wife over weekend. Speak Out! Lesson #12 completed with rare min A for slowed rate on structured tasks. Targeted conversational level speech with occasional non-verbal cues required to slow rate this session. Occasional self-corrections exhibited. SLP continues to recommend increased practice in extended conversation at home and in community as pt reports some avoidance of longer, more complex conversation with inconsistent practice obtained.   06-16-22: Pt practiced conversational questions with wife as part of HEP with focus on intentional slow speech. Further education and training conducted today for utilizing intent while speaking in conversation as pt able to successfully utilize on structured tasks. SLP trialed intermittent pausing, increased breath support, and coordination with motor movements to aid fluency in conversation, which was intermittently beneficial. Occasional independent self-corrections implemented to reduce initial sound repetitions.   06-14-22: Pt entered with intermittent rushes in speech and inconsistent rate of speech. SLP cued slow, intentional speech via written aid, which was occasionally effective. Speak Out! Lesson #10 completed with rare min A for slowed rate on structured tasks. Targeted conversational level speech with occasional to usual cues required to slow rate this session. Occasional self-corrections exhibited. SLP suggested increased practice in extended conversation at home and in community as pt reports some avoidance of longer, more complex conversation with limited practice obtained.   06-08-22: Reported completion of HEP 1x/day due to busy schedule. Targeted improving vocal quality, maintaining intensity, and reducing rate through progressively difficulty  speech tasks using Speak Out! program, lesson #8. ST leads pt through exercises providing rare model prior to pt execution. Averages this date: loud "ah" 90 dB; reading 73 dB; cognitive speech task 74 dB. Rare min A required this date to slow rate during structured practice. Conversational sample of approx 10 minutes, pt averages WNL volume with occasional min-A to be intentional to reduce rate of speech and trial technique to reduce stutter (relaxed exhalation, restart). Good carryover of education re: throat clear alternatives and abdominal breathing exhibited given occasional min A.    PATIENT EDUCATION: Education details: see above Person educated: Patient Education method: Education officer, environmental, Verbal cues, and Handouts Education comprehension: verbalized understanding, returned demonstration, verbal cues required, and needs further education     HOME EXERCISE PROGRAM: Speak Out! workbook     GOALS: Goals reviewed with patient? Yes   SHORT TERM GOALS: Target date: 05/31/2022     Pt will maintain WNL rate of speech during structured practice (oral reading, cognitive exercise, etc.), multi-sentence level, with occasional min-A over 2 sessions.  Baseline: 05-25-22, 06-02-22 Goal status: Met   2.  Pt will self-ID rushes in speech, as they occur, in 50% of opportunities to increase ability to self-correct rate with occasional min-A over 2 sessions.  Baseline:  Goal status: Partially Met (only seen 2 tx visits thus far)   3.  Pt will report completion of dysarthria HEP at least 1x/day over 1 week period with mod-I Baseline:  Goal status: Met   LONG TERM GOALS: Target date: 5/68/12 (recert)   Pt will demonstrate average WNL  rate of speech over 18+ minute conversational sample with no more than 8 rushes of speech over sample period, with occasional min-A.  Baseline:  Goal status: Revised (due to fluctuations in speech rate)   2.  Pt will ID and self-correct rushes in rate of speech  in 70% of occurences in structured speech practice (e.g. oral reading) over 2 sessions with rare min-A.  Baseline: 06-20-22, 06-27-22 Goal status: Met   3.  Pt will report improvement via CPIB PROM by 4 points by d/c, indicative subjective improvement in communicative abilities.  Baseline:  Goal status: ongoing (for recert)   ASSESSMENT:   CLINICAL IMPRESSION: Patient is a 61 y.o. M who was seen today for dysarthria 2/2 Parkinson's. Pt presents with volume within gross normal limits but intelligibility intermittently impacted primarily by fast rate of speech. Rate is variable, ranging from normal to frequent rushes. During rushes, also demonstrating initial part word repetitions, in which pt able to correct with increasing accuracy given intermittent cues. Conducted ongoing education and training of Speak Out! Principles and dysarthria compensations to optimize carryover of intentional speech to effectively reduce speech rate in conversation with rare to occasional cues required based on daily variations. Compensatory techniques occasionally effective with most benefit of speaking with intentionally slow rate. Pt would continue to benefit from skilled ST to address motor speech to increase intelligibility and pt's ability to effectively communicate.    OBJECTIVE IMPAIRMENTS  Objective impairments include dysarthria. These impairments are limiting patient from effectively communicating at home and in community.Factors affecting potential to achieve goals and functional outcome are medical prognosis.. Patient will benefit from skilled SLP services to address above impairments and improve overall function.   REHAB POTENTIAL: Good   PLAN: SLP FREQUENCY: 2x/week   SLP DURATION: 8 weeks (+ 4 weeks for recert due to delayed scheduling)   PLANNED INTERVENTIONS: SLP instruction and feedback, Compensatory strategies, and Patient/family education   Marzetta Board, [place] 06/27/2022, 10:59 AM

## 2022-06-27 NOTE — Patient Instructions (Addendum)
   Ways to Prevent Future Parkinson's-Related Complications:  1.   EXERCISE regularly.  Perform your therapy exercises and incorporate safe aerobic exercise when possible (swimming, stationary bike, arm bike, seated stepper)  2.   Keep you FEET APART when you are standing (or sitting) to allow you to have better balance and reach further (which can help with shoulder rigidity).  Also make sure your feet are apart when you are sitting before you stand up.  3.   Focus on BIGGER movements during daily activities--deliberately reach overhead, straighten elbows and extend fingers  4.   Anytime you reach or move shoulder, make sure you have good upright POSTURE.  5.   When dressing or reaching for your seatbelt make sure to use your body to assist by TWISTING and LOOKING at where you are reaching while you reach--this can help to minimize stress on the shoulder and reduce the risk of a rotator cuff tear, gives visual feedback to the brain about movement size and where your arm is in space, makes eye muscles more active, and helps with neck/trunk rigidity  6.  When you reach for something overhead, make sure your THUMB is facing UP.  This is a better position for your shoulder.  7.  SWING YOUR ARMS when you walk! (If able).  People with PD are at increased risk for frozen shoulder and swinging your arms can reduce this risk.  8.  Add cognitive components to exercise as safe/able.

## 2022-06-30 ENCOUNTER — Ambulatory Visit: Payer: Medicare Other

## 2022-06-30 ENCOUNTER — Encounter: Payer: Medicare Other | Admitting: Occupational Therapy

## 2022-06-30 DIAGNOSIS — R471 Dysarthria and anarthria: Secondary | ICD-10-CM | POA: Diagnosis not present

## 2022-06-30 NOTE — Therapy (Signed)
OUTPATIENT SPEECH LANGUAGE PATHOLOGY TREATMENT NOTE    Patient Name: Ian [name] MRN: [id number] DOB:03/10/1961, 61 y.o., male Today's Date: 06/30/2022  PCP: Bernerd Limbo, MD REFERRING PROVIDER: Ludwig Clarks, DO  END OF SESSION:    End of Session - 06/30/22 0849     Visit Number 10    Number of Visits 17    Date for SLP Re-Evaluation 07/29/22    Authorization Type Medicare/Tricare    SLP Start Time [phone]   pt arrived late   SLP Stop Time  0930    SLP Time Calculation (min) 40 min    Activity Tolerance Patient tolerated treatment well               Speech Therapy Progress Note  Dates of Reporting Period: 05-03-22 to current  Objective Reports of Subjective Statement: Pt has been seen for 10 ST targeting dysarthria secondary to Parkinson's Disease. Pt reports subjective improvements and increased ability to monitor and control rate of speech on structured and unstructured tasks.   Objective Measurements: Pt presents with good implementation and carryover of dysarthria strategies with rare to occasional min A needed in conversational level speech.   Goal Update: see below  Plan: see below  Reason Skilled Services are Required: Pt would continue to benefit from skilled ST intervention to optimize carryover and implementation of targeted strategies in more complex and challenging communication scenarios.      Past Medical History:  Diagnosis Date   Anxiety    History of kidney stones    passed   PD (Parkinson's disease) ([place]) 09/10/2013   PONV (postoperative nausea and vomiting)    nausea    Past Surgical History:  Procedure Laterality Date   COLONOSCOPY     MINOR PLACEMENT OF FIDUCIAL N/A 01/31/2017   Procedure: MINOR PLACEMENT OF FIDUCIAL;  Surgeon: Erline Levine, MD;  Location: [facility];  Service: Neurosurgery;  Laterality: N/A;  Fiducial placement   none     PULSE GENERATOR IMPLANT Right 02/17/2017   Procedure: Right implantable pulse generator placement;   Surgeon: Erline Levine, MD;  Location: [facility];  Service: Neurosurgery;  Laterality: Right;  Bilateral implantable pulse generator placement   SUBTHALAMIC STIMULATOR BATTERY REPLACEMENT N/A 10/19/2018   Procedure: Deep Brain stimulator implantable pulse generator revision;  Surgeon: Erline Levine, MD;  Location: [facility];  Service: Neurosurgery;  Laterality: N/A;  Deep Brain stimulator implantable pulse generator revision   SUBTHALAMIC STIMULATOR INSERTION Bilateral 02/10/2017   Procedure: Bilateral Deep brain stimulator placement;  Surgeon: Erline Levine, MD;  Location: [facility];  Service: Neurosurgery;  Laterality: Bilateral;  Bilateral Deep brain stimulator placement   UPPER GASTROINTESTINAL ENDOSCOPY     Patient Active Problem List   Diagnosis Date Noted   Closed fracture of phalanx of left fifth toe 10/19/2021   Tinea versicolor 05/28/2021   Left rotator cuff tear 08/20/2020   Nonallopathic lesion of sacral region 07/25/2018   Nonallopathic lesion of lumbosacral region 07/25/2018   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   Acute bursitis of right shoulder 12/06/2017   Antalgic gait 01/24/2017   Intercostal muscle tear 07/19/2016   PD (Parkinson's disease) ([place]) 06/24/2016   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   Nonallopathic lesion of thoracic region 01/25/2016   Nonallopathic lesion-rib cage 01/25/2016   Nonallopathic lesion of cervical region 01/25/2016   Parkinson's disease ([place]) 01/25/2016   Lateral epicondylitis 12/01/2014   REM behavioral disorder 08/27/2014   Paralysis agitans ([place]) 10/18/2013    ONSET DATE: dx Parkinson's 2014, DBS 2017,  referred for ST 04/28/2022  REFERRING DIAG: G20 (ICD-10-CM) - Parkinson's disease  THERAPY DIAG: Dysarthria and anarthria  Rationale for Evaluation and Treatment Rehabilitation  SUBJECTIVE: "better" re: conversational speech  PAIN:  Are you having pain? No  OBJECTIVE:   TODAY'S TREATMENT:  06-30-22: Pt stated "I've been trying to focus  really hard and be intentional with my speech" with success indicated. Good carryover of intentional speech and focused concentration on slow rate in conversation endorsed, with less repetitions requested outside of therapy (ex: only 1 repetition requested over 1 hour personal training session). Pt able to intermittently self-correct rushes in conversational speech without cues today. Pt continues to complete Speak Out! BID with great benefit endorsed. Recommended increasing repetitions to optimize endurance over longer communication messages as well as utilizing warm up exercises prior to speaking engagements (meetings, appointments, etc). Rare throat clearing noted today with usual implementation of water sips to reduce throat irritation.   06-27-22: Pt enters with rare rushes in speech today. Pt able to recognize rapid rate or rushes in speech with rare min A in short structured conversation. Self-awareness, recognition, and re-attempts become more frequent given SLP education and training. Pt aware how emotions, medications, and fatigue factor into speech production and able to compensate appropriately with occasional min A. SLP continues to educate patient on seeking opportunities to practice conversational level speech to optimize carryover of intentional slow rate of speech.   06-23-22: Continued to focus on intentional slow rate in conversation, with pt demonstrating increased self-awareness and ability to self-correct with occasional min A from SLP. Generated additional practice opportunities for patient to engage in intentional slow rate of conversation at home to maximize carryover of targeted techniques.   06-20-22: Pt endorsed some benefit and carryover of slower rate with recommended increased conversational practice with wife over weekend. Speak Out! Lesson #12 completed with rare min A for slowed rate on structured tasks. Targeted conversational level speech with occasional non-verbal cues required  to slow rate this session. Occasional self-corrections exhibited. SLP continues to recommend increased practice in extended conversation at home and in community as pt reports some avoidance of longer, more complex conversation with inconsistent practice obtained.   06-16-22: Pt practiced conversational questions with wife as part of HEP with focus on intentional slow speech. Further education and training conducted today for utilizing intent while speaking in conversation as pt able to successfully utilize on structured tasks. SLP trialed intermittent pausing, increased breath support, and coordination with motor movements to aid fluency in conversation, which was intermittently beneficial. Occasional independent self-corrections implemented to reduce initial sound repetitions.     PATIENT EDUCATION: Education details: see above Person educated: Patient Education method: Education officer, environmental, Verbal cues, and Handouts Education comprehension: verbalized understanding, returned demonstration, verbal cues required, and needs further education     HOME EXERCISE PROGRAM: Speak Out! workbook     GOALS: Goals reviewed with patient? Yes   SHORT TERM GOALS: Target date: 05/31/2022     Pt will maintain WNL rate of speech during structured practice (oral reading, cognitive exercise, etc.), multi-sentence level, with occasional min-A over 2 sessions.  Baseline: 05-25-22, 06-02-22 Goal status: Met   2.  Pt will self-ID rushes in speech, as they occur, in 50% of opportunities to increase ability to self-correct rate with occasional min-A over 2 sessions.  Baseline:  Goal status: Partially Met (only seen 2 tx visits thus far)   3.  Pt will report completion of dysarthria HEP at least 1x/day over 1  week period with mod-I Baseline:  Goal status: Met   LONG TERM GOALS: Target date: 7/44/51 (recert)   Pt will demonstrate average WNL rate of speech over 18+ minute conversational sample with no more  than 8 rushes of speech over sample period, with occasional min-A.  Baseline: 06-30-22 Goal status: ongoing   2.  Pt will ID and self-correct rushes in rate of speech in 70% of occurences in structured speech practice (e.g. oral reading) over 2 sessions with rare min-A.  Baseline: 06-20-22, 06-27-22 Goal status: Met   3.  Pt will report improvement via CPIB PROM by 4 points by d/c, indicative subjective improvement in communicative abilities.  Baseline:  Goal status: ongoing    ASSESSMENT:   CLINICAL IMPRESSION: Patient is a 61 y.o. M who was seen today for dysarthria 2/2 Parkinson's. Pt presents with volume within gross normal limits but intelligibility intermittently impacted primarily by fast rate of speech. Rate is variable, ranging from normal to occasional rushes. During rushes, also demonstrating initial part word repetitions, in which pt able to correct with increasing accuracy given rare cues today. Conducted ongoing education and training of Speak Out! Principles and dysarthria compensations to optimize carryover of intentional speech to effectively reduce speech rate in conversation with rare to occasional cues required based on daily variations. Speaking with intentionally slow rate has become increasingly more effective in conversation per patient report. Pt would continue to benefit from skilled ST to address motor speech to increase intelligibility and pt's ability to effectively communicate.    OBJECTIVE IMPAIRMENTS  Objective impairments include dysarthria. These impairments are limiting patient from effectively communicating at home and in community.Factors affecting potential to achieve goals and functional outcome are medical prognosis.. Patient will benefit from skilled SLP services to address above impairments and improve overall function.   REHAB POTENTIAL: Good   PLAN: SLP FREQUENCY: 2x/week   SLP DURATION: 8 weeks (+ 4 weeks for recert due to delayed scheduling)    PLANNED INTERVENTIONS: SLP instruction and feedback, Compensatory strategies, and Patient/family education   Marzetta Board, [place] 06/30/2022, 8:51 AM

## 2022-07-04 ENCOUNTER — Encounter: Payer: Medicare Other | Admitting: Occupational Therapy

## 2022-07-04 ENCOUNTER — Ambulatory Visit: Payer: Medicare Other | Admitting: Speech Pathology

## 2022-07-06 ENCOUNTER — Ambulatory Visit: Payer: Medicare Other

## 2022-07-06 ENCOUNTER — Encounter: Payer: Medicare Other | Admitting: Occupational Therapy

## 2022-07-06 DIAGNOSIS — R471 Dysarthria and anarthria: Secondary | ICD-10-CM

## 2022-07-06 NOTE — Therapy (Deleted)
OUTPATIENT SPEECH LANGUAGE PATHOLOGY TREATMENT NOTE    Patient Name: Ian [name] MRN: [id number] DOB:1961-09-22, 61 y.o., male Today's Date: 07/06/2022  PCP: Bernerd Limbo, MD REFERRING PROVIDER: Ludwig Clarks, DO  END OF SESSION:        Speech Therapy Progress Note  Dates of Reporting Period: 05-03-22 to current  Objective Reports of Subjective Statement: Pt has been seen for 10 ST targeting dysarthria secondary to Parkinson's Disease. Pt reports subjective improvements and increased ability to monitor and control rate of speech on structured and unstructured tasks.   Objective Measurements: Pt presents with good implementation and carryover of dysarthria strategies with rare to occasional min A needed in conversational level speech.   Goal Update: see below  Plan: see below  Reason Skilled Services are Required: Pt would continue to benefit from skilled ST intervention to optimize carryover and implementation of targeted strategies in more complex and challenging communication scenarios.      Past Medical History:  Diagnosis Date   Anxiety    History of kidney stones    passed   PD (Parkinson's disease) ([place]) 09/10/2013   PONV (postoperative nausea and vomiting)    nausea    Past Surgical History:  Procedure Laterality Date   COLONOSCOPY     MINOR PLACEMENT OF FIDUCIAL N/A 01/31/2017   Procedure: MINOR PLACEMENT OF FIDUCIAL;  Surgeon: Erline Levine, MD;  Location: [place];  Service: Neurosurgery;  Laterality: N/A;  Fiducial placement   none     PULSE GENERATOR IMPLANT Right 02/17/2017   Procedure: Right implantable pulse generator placement;  Surgeon: Erline Levine, MD;  Location: [place];  Service: Neurosurgery;  Laterality: Right;  Bilateral implantable pulse generator placement   SUBTHALAMIC STIMULATOR BATTERY REPLACEMENT N/A 10/19/2018   Procedure: Deep Brain stimulator implantable pulse generator revision;  Surgeon: Erline Levine, MD;  Location: [place];  Service:  Neurosurgery;  Laterality: N/A;  Deep Brain stimulator implantable pulse generator revision   SUBTHALAMIC STIMULATOR INSERTION Bilateral 02/10/2017   Procedure: Bilateral Deep brain stimulator placement;  Surgeon: Erline Levine, MD;  Location: [place];  Service: Neurosurgery;  Laterality: Bilateral;  Bilateral Deep brain stimulator placement   UPPER GASTROINTESTINAL ENDOSCOPY     Patient Active Problem List   Diagnosis Date Noted   Closed fracture of phalanx of left fifth toe 10/19/2021   Tinea versicolor 05/28/2021   Left rotator cuff tear 08/20/2020   Nonallopathic lesion of sacral region 07/25/2018   Nonallopathic lesion of lumbosacral region 07/25/2018   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   Acute bursitis of right shoulder 12/06/2017   Antalgic gait 01/24/2017   Intercostal muscle tear 07/19/2016   PD (Parkinson's disease) ([place]) 06/24/2016   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  Rationale for Evaluation and Treatment Rehabilitation  SUBJECTIVE: "***  PAIN:  Are you having pain? No  OBJECTIVE:   TODAY'S TREATMENT:  07-06-22:   06-30-22: Pt stated "I've been trying to focus really hard and be intentional with my speech" with success indicated. Good carryover of intentional speech and focused concentration on slow rate in conversation endorsed, with less  repetitions requested outside of therapy (ex: only 1 repetition requested over 1 hour personal training session). Pt able to intermittently self-correct rushes in  conversational speech without cues today. Pt continues to complete Speak Out! BID with great benefit endorsed. Recommended increasing repetitions to optimize endurance over longer communication messages as well as utilizing warm up exercises prior to speaking engagements (meetings, appointments, etc). Rare throat clearing noted today with usual implementation of water sips to reduce throat irritation.   06-27-22: Pt enters with rare rushes in speech today. Pt able to recognize rapid rate or rushes in speech with rare min A in short structured conversation. Self-awareness, recognition, and re-attempts become more frequent given SLP education and training. Pt aware how emotions, medications, and fatigue factor into speech production and able to compensate appropriately with occasional min A. SLP continues to educate patient on seeking opportunities to practice conversational level speech to optimize carryover of intentional slow rate of speech.   06-23-22: Continued to focus on intentional slow rate in conversation, with pt demonstrating increased self-awareness and ability to self-correct with occasional min A from SLP. Generated additional practice opportunities for patient to engage in intentional slow rate of conversation at home to maximize carryover of targeted techniques.   06-20-22: Pt endorsed some benefit and carryover of slower rate with recommended increased conversational practice with wife over weekend. Speak Out! Lesson #12 completed with rare min A for slowed rate on structured tasks. Targeted conversational level speech with occasional non-verbal cues required to slow rate this session. Occasional self-corrections exhibited. SLP continues to recommend increased practice in extended conversation at home and in community as pt reports some avoidance of longer, more complex conversation with inconsistent practice obtained.   06-16-22: Pt practiced conversational questions with wife as part of HEP with  focus on intentional slow speech. Further education and training conducted today for utilizing intent while speaking in conversation as pt able to successfully utilize on structured tasks. SLP trialed intermittent pausing, increased breath support, and coordination with motor movements to aid fluency in conversation, which was intermittently beneficial. Occasional independent self-corrections implemented to reduce initial sound repetitions.     PATIENT EDUCATION: Education details: see above Person educated: Patient Education method: Education officer, environmental, Verbal cues, and Handouts Education comprehension: verbalized understanding, returned demonstration, verbal cues required, and needs further education     HOME EXERCISE PROGRAM: Speak Out! workbook     GOALS: Goals reviewed with patient? Yes   SHORT TERM GOALS: Target date: 05/31/2022     Pt will maintain WNL rate of speech during structured practice (oral reading, cognitive exercise, etc.), multi-sentence level, with occasional min-A over 2 sessions.  Baseline: 05-25-22, 06-02-22 Goal status: Met   2.  Pt will self-ID rushes in speech, as they occur, in 50% of opportunities to increase ability to self-correct rate with occasional min-A over 2 sessions.  Baseline:  Goal status: Partially Met (only seen 2 tx visits thus far)   3.  Pt will report completion of dysarthria HEP at least 1x/day over 1 week period with mod-I Baseline:  Goal status: Met   LONG TERM GOALS: Target date: 0/53/97 (recert)   Pt will demonstrate average WNL rate of speech over 18+ minute conversational sample with no more than 8 rushes of speech over sample period, with occasional min-A.  Baseline: 06-30-22 Goal status: ongoing   2.  Pt will ID and self-correct rushes in rate of speech in 70% of occurences in structured speech practice (e.g. oral reading) over 2 sessions  with rare min-A.  Baseline: 06-20-22, 06-27-22 Goal status: Met   3.  Pt will report  improvement via CPIB PROM by 4 points by d/c, indicative subjective improvement in communicative abilities.  Baseline:  Goal status: ongoing    ASSESSMENT:   CLINICAL IMPRESSION: Patient is a 61 y.o. M who was seen today for dysarthria 2/2 Parkinson's. Pt presents with volume within gross normal limits but intelligibility intermittently impacted primarily by fast rate of speech. Rate is variable, ranging from normal to occasional rushes. During rushes, also demonstrating initial part word repetitions, in which pt able to correct with increasing accuracy given rare cues today. Conducted ongoing education and training of Speak Out! Principles and dysarthria compensations to optimize carryover of intentional speech to effectively reduce speech rate in conversation with rare to occasional cues required based on daily variations. Speaking with intentionally slow rate has become increasingly more effective in conversation per patient report. Pt would continue to benefit from skilled ST to address motor speech to increase intelligibility and pt's ability to effectively communicate.    OBJECTIVE IMPAIRMENTS  Objective impairments include dysarthria. These impairments are limiting patient from effectively communicating at home and in community.Factors affecting potential to achieve goals and functional outcome are medical prognosis.. Patient will benefit from skilled SLP services to address above impairments and improve overall function.   REHAB POTENTIAL: Good   PLAN: SLP FREQUENCY: 2x/week   SLP DURATION: 8 weeks (+ 4 weeks for recert due to delayed scheduling)   PLANNED INTERVENTIONS: SLP instruction and feedback, Compensatory strategies, and Patient/family education   Marzetta Board, [place] 07/06/2022, 1:35 PM

## 2022-07-06 NOTE — Therapy (Signed)
OUTPATIENT SPEECH LANGUAGE PATHOLOGY TREATMENT NOTE    Patient Name: Ian [name] MRN: [id number] DOB:05-21-1961, 61 y.o., male Today's Date: 07/06/2022  PCP: Bernerd Limbo, MD REFERRING PROVIDER: Ludwig Clarks, DO  END OF SESSION:    End of Session - 07/06/22 1402     Visit Number 11    Number of Visits 17    Date for SLP Re-Evaluation 07/29/22    Authorization Type Medicare/Tricare    SLP Start Time 1402    SLP Stop Time  1430   pt requested early leave due to another appt   SLP Time Calculation (min) 28 min    Activity Tolerance Patient tolerated treatment well                Past Medical History:  Diagnosis Date   Anxiety    History of kidney stones    passed   PD (Parkinson's disease) ([place]) 09/10/2013   PONV (postoperative nausea and vomiting)    nausea    Past Surgical History:  Procedure Laterality Date   COLONOSCOPY     MINOR PLACEMENT OF FIDUCIAL N/A 01/31/2017   Procedure: MINOR PLACEMENT OF FIDUCIAL;  Surgeon: Erline Levine, MD;  Location: [place];  Service: Neurosurgery;  Laterality: N/A;  Fiducial placement   none     PULSE GENERATOR IMPLANT Right 02/17/2017   Procedure: Right implantable pulse generator placement;  Surgeon: Erline Levine, MD;  Location: [place];  Service: Neurosurgery;  Laterality: Right;  Bilateral implantable pulse generator placement   SUBTHALAMIC STIMULATOR BATTERY REPLACEMENT N/A 10/19/2018   Procedure: Deep Brain stimulator implantable pulse generator revision;  Surgeon: Erline Levine, MD;  Location: [place];  Service: Neurosurgery;  Laterality: N/A;  Deep Brain stimulator implantable pulse generator revision   SUBTHALAMIC STIMULATOR INSERTION Bilateral 02/10/2017   Procedure: Bilateral Deep brain stimulator placement;  Surgeon: Erline Levine, MD;  Location: [place];  Service: Neurosurgery;  Laterality: Bilateral;  Bilateral Deep brain stimulator placement   UPPER GASTROINTESTINAL ENDOSCOPY     Patient Active Problem List   Diagnosis Date  Noted   Closed fracture of phalanx of left fifth toe 10/19/2021   Tinea versicolor 05/28/2021   Left rotator cuff tear 08/20/2020   Nonallopathic lesion of sacral region 07/25/2018   Nonallopathic lesion of lumbosacral region 07/25/2018   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   Acute bursitis of right shoulder 12/06/2017   Antalgic gait 01/24/2017   Intercostal muscle tear 07/19/2016   PD (Parkinson's disease) ([place]) 06/24/2016   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 I need to leave early"  PAIN:  Are you having pain? No  OBJECTIVE:   TODAY'S TREATMENT:  07-06-22: Pt entered with fast rate and usual stuttering. SLP re-educated and instructed use of diaphragmatic breathing and utilized modified Speak Out! Exercises to re-focus intent on slow, deliberate speech rate. In subsequent conversation, pt able to optimize speech rate with use of intentional slow speech and self-correct rare rushes in speech with mod I. Discussed how intent can be shifted between slow  rate and loudness as loudness apparently reducing when fatigued. SLP recommended continuing Speak Out program as well as increasing focus on conversational opportunities outside of ST.   06-30-22: Pt stated "I've been trying to focus really hard and be intentional with my speech" with success indicated. Good carryover of intentional speech and focused concentration on slow rate in  conversation endorsed, with less repetitions requested outside of therapy (ex: only 1 repetition requested over 1 hour personal training session). Pt able to intermittently self-correct rushes in conversational speech without cues today. Pt continues to complete Speak Out! BID with great benefit endorsed. Recommended increasing repetitions to optimize endurance over longer communication messages as well as utilizing warm up exercises prior to speaking engagements (meetings, appointments, etc). Rare throat clearing noted today with usual implementation of water sips to reduce throat irritation.   06-27-22: Pt enters with rare rushes in speech today. Pt able to recognize rapid rate or rushes in speech with rare min A in short structured conversation. Self-awareness, recognition, and re-attempts become more frequent given SLP education and training. Pt aware how emotions, medications, and fatigue factor into speech production and able to compensate appropriately with occasional min A. SLP continues to educate patient on seeking opportunities to practice conversational level speech to optimize carryover of intentional slow rate of speech.   06-23-22: Continued to focus on intentional slow rate in conversation, with pt demonstrating increased self-awareness and ability to self-correct with occasional min A from SLP. Generated additional practice opportunities for patient to engage in intentional slow rate of conversation at home to maximize carryover of targeted techniques.   06-20-22: Pt endorsed some benefit and carryover of slower rate with recommended increased conversational practice with wife over weekend. Speak Out! Lesson #12 completed with rare min A for slowed rate on structured tasks. Targeted conversational level speech with occasional non-verbal cues required to slow rate this session. Occasional self-corrections exhibited. SLP continues to recommend increased practice in extended conversation at home and  in community as pt reports some avoidance of longer, more complex conversation with inconsistent practice obtained.   06-16-22: Pt practiced conversational questions with wife as part of HEP with focus on intentional slow speech. Further education and training conducted today for utilizing intent while speaking in conversation as pt able to successfully utilize on structured tasks. SLP trialed intermittent pausing, increased breath support, and coordination with motor movements to aid fluency in conversation, which was intermittently beneficial. Occasional independent self-corrections implemented to reduce initial sound repetitions.     PATIENT EDUCATION: Education details: see above Person educated: Patient Education method: Education officer, environmental, Verbal cues, and Handouts Education comprehension: verbalized understanding, returned demonstration, verbal cues required, and needs further education     HOME EXERCISE PROGRAM: Speak Out! workbook     GOALS: Goals reviewed with patient? Yes   SHORT TERM GOALS: Target date: 05/31/2022     Pt will maintain WNL rate of speech during structured practice (oral reading, cognitive exercise, etc.), multi-sentence level, with occasional min-A over 2 sessions.  Baseline: 05-25-22, 06-02-22 Goal status: Met   2.  Pt will self-ID rushes in speech, as they occur, in 50% of opportunities to increase ability to self-correct rate with occasional min-A over 2 sessions.  Baseline:  Goal status: Partially Met (only seen 2 tx visits thus far)   3.  Pt will report completion of dysarthria HEP at least 1x/day over 1 week period with mod-I Baseline:  Goal status: Met   LONG TERM GOALS: Target date: 04/13/82 (recert)  Pt will demonstrate average WNL rate of speech over 18+ minute conversational sample with no more than 8 rushes of speech over sample period, with occasional min-A.  Baseline: 06-30-22 Goal status: ongoing   2.  Pt will ID and self-correct  rushes in rate of speech in 70% of occurences in structured speech practice (e.g. oral reading) over 2 sessions with rare min-A.  Baseline: 06-20-22, 06-27-22 Goal status: Met   3.  Pt will report improvement via CPIB PROM by 4 points by d/c, indicative subjective improvement in communicative abilities.  Baseline:  Goal status: ongoing    ASSESSMENT:   CLINICAL IMPRESSION: Patient is a 61 y.o. M who was seen today for dysarthria 2/2 Parkinson's. Pt presents with volume within gross normal limits but intelligibility intermittently impacted primarily by fast rate of speech. Rate is variable, ranging from normal to occasional rushes. During rushes, also demonstrating initial part word repetitions, in which pt able to correct with increasing accuracy given rare cues today. Conducted ongoing education and training of Speak Out! Principles and dysarthria compensations to optimize carryover of intentional speech to effectively reduce speech rate in conversation with rare to occasional cues required based on daily variations. Speaking with intentionally slow rate has become increasingly more effective in conversation per patient report. Pt would continue to benefit from skilled ST to address motor speech to increase intelligibility and pt's ability to effectively communicate.    OBJECTIVE IMPAIRMENTS  Objective impairments include dysarthria. These impairments are limiting patient from effectively communicating at home and in community.Factors affecting potential to achieve goals and functional outcome are medical prognosis.. Patient will benefit from skilled SLP services to address above impairments and improve overall function.   REHAB POTENTIAL: Good   PLAN: SLP FREQUENCY: 2x/week   SLP DURATION: 8 weeks (+ 4 weeks for recert due to delayed scheduling)   PLANNED INTERVENTIONS: SLP instruction and feedback, Compensatory strategies, and Patient/family education   Marzetta Board,  [place] 07/06/2022, 2:32 PM

## 2022-07-11 ENCOUNTER — Ambulatory Visit: Payer: Medicare Other

## 2022-07-11 ENCOUNTER — Encounter: Payer: Medicare Other | Admitting: Occupational Therapy

## 2022-07-11 DIAGNOSIS — R471 Dysarthria and anarthria: Secondary | ICD-10-CM | POA: Diagnosis not present

## 2022-07-11 NOTE — Therapy (Signed)
OUTPATIENT SPEECH LANGUAGE PATHOLOGY TREATMENT NOTE    Patient Name: Ian [name] MRN: [id number] DOB:[birth year], 61 y.o., male Today's Date: 07/11/2022  PCP: Bernerd Limbo, MD REFERRING PROVIDER: Ludwig Clarks, DO  END OF SESSION:    End of Session - 07/11/22 1018     Visit Number 12    Number of Visits 17    Date for SLP Re-Evaluation 07/29/22    Authorization Type Medicare/Tricare    SLP Start Time 1020   pt arrived late   SLP Stop Time  1100    SLP Time Calculation (min) 40 min    Activity Tolerance Patient tolerated treatment well                 Past Medical History:  Diagnosis Date   Anxiety    History of kidney stones    passed   PD (Parkinson's disease) ([name]) 09/10/2013   PONV (postoperative nausea and vomiting)    nausea    Past Surgical History:  Procedure Laterality Date   COLONOSCOPY     MINOR PLACEMENT OF FIDUCIAL N/A 01/31/2017   Procedure: MINOR PLACEMENT OF FIDUCIAL;  Surgeon: Erline Levine, MD;  Location: [facility];  Service: Neurosurgery;  Laterality: N/A;  Fiducial placement   none     PULSE GENERATOR IMPLANT Right 02/17/2017   Procedure: Right implantable pulse generator placement;  Surgeon: Erline Levine, MD;  Location: [facility];  Service: Neurosurgery;  Laterality: Right;  Bilateral implantable pulse generator placement   SUBTHALAMIC STIMULATOR BATTERY REPLACEMENT N/A 10/19/2018   Procedure: Deep Brain stimulator implantable pulse generator revision;  Surgeon: Erline Levine, MD;  Location: [facility];  Service: Neurosurgery;  Laterality: N/A;  Deep Brain stimulator implantable pulse generator revision   SUBTHALAMIC STIMULATOR INSERTION Bilateral 02/10/2017   Procedure: Bilateral Deep brain stimulator placement;  Surgeon: Erline Levine, MD;  Location: [facility];  Service: Neurosurgery;  Laterality: Bilateral;  Bilateral Deep brain stimulator placement   UPPER GASTROINTESTINAL ENDOSCOPY     Patient Active Problem List   Diagnosis Date Noted   Closed fracture  of phalanx of left fifth toe 10/19/2021   Tinea versicolor 05/28/2021   Left rotator cuff tear 08/20/2020   Nonallopathic lesion of sacral region 07/25/2018   Nonallopathic lesion of lumbosacral region 07/25/2018   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   Acute bursitis of right shoulder 12/06/2017   Antalgic gait 01/24/2017   Intercostal muscle tear 07/19/2016   PD (Parkinson's disease) ([place]) 06/24/2016   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) 01/25/2016   Lateral epicondylitis 12/01/2014   REM behavioral disorder 08/27/2014   Paralysis agitans ([place]) 10/18/2013    ONSET DATE: dx Parkinson's 2014, DBS 2017, referred for ST 04/28/2022  REFERRING DIAG: G20 (ICD-10-CM) - Parkinson's disease  THERAPY DIAG: Dysarthria and anarthria  Rationale for Evaluation and Treatment Rehabilitation  SUBJECTIVE: "Sometimes good, sometimes bad" re: speech rate   PAIN:  Are you having pain? No  OBJECTIVE:   TODAY'S TREATMENT:  07-11-22: Pt entered with intermittent fast rate and occasional stutter, which improved with cued diaphragmatic breathing to reduce tension and stutter. SLP utilized modified Speak Out! Exercises to re-focus intent on slow, deliberate speech rate. Pt able to self-correct rushes in speech during oral reading and cognitive exercises with rare min A. Re-educated patient on considering Speech Easy device to aid rate control as PD progresses. Pt verbalized  understanding.   07-06-22: Pt entered with fast rate and usual stuttering. SLP re-educated and instructed use of diaphragmatic breathing and utilized modified Speak Out! Exercises to re-focus intent on slow, deliberate speech rate. In subsequent conversation, pt able to optimize speech rate with use of intentional slow speech and self-correct rare rushes in  speech with mod I. Discussed how intent can be shifted between slow rate and loudness as loudness apparently reducing when fatigued. SLP recommended continuing Speak Out program as well as increasing focus on conversational opportunities outside of ST.   06-30-22: Pt stated "I've been trying to focus really hard and be intentional with my speech" with success indicated. Good carryover of intentional speech and focused concentration on slow rate in conversation endorsed, with less repetitions requested outside of therapy (ex: only 1 repetition requested over 1 hour personal training session). Pt able to intermittently self-correct rushes in conversational speech without cues today. Pt continues to complete Speak Out! BID with great benefit endorsed. Recommended increasing repetitions to optimize endurance over longer communication messages as well as utilizing warm up exercises prior to speaking engagements (meetings, appointments, etc). Rare throat clearing noted today with usual implementation of water sips to reduce throat irritation.   06-27-22: Pt enters with rare rushes in speech today. Pt able to recognize rapid rate or rushes in speech with rare min A in short structured conversation. Self-awareness, recognition, and re-attempts become more frequent given SLP education and training. Pt aware how emotions, medications, and fatigue factor into speech production and able to compensate appropriately with occasional min A. SLP continues to educate patient on seeking opportunities to practice conversational level speech to optimize carryover of intentional slow rate of speech.   06-23-22: Continued to focus on intentional slow rate in conversation, with pt demonstrating increased self-awareness and ability to self-correct with occasional min A from SLP. Generated additional practice opportunities for patient to engage in intentional slow rate of conversation at home to maximize carryover of targeted techniques.    06-20-22: Pt endorsed some benefit and carryover of slower rate with recommended increased conversational practice with wife over weekend. Speak Out! Lesson #12 completed with rare min A for slowed rate on structured tasks. Targeted conversational level speech with occasional non-verbal cues required to slow rate this session. Occasional self-corrections exhibited. SLP continues to recommend increased practice in extended conversation at home and in community as pt reports some avoidance of longer, more complex conversation with inconsistent practice obtained.    PATIENT EDUCATION: Education details: see above Person educated: Patient Education method: Education officer, environmental, Verbal cues, and Handouts Education comprehension: verbalized understanding, returned demonstration, verbal cues required, and needs further education     HOME EXERCISE PROGRAM: Speak Out! workbook     GOALS: Goals reviewed with patient? Yes   SHORT TERM GOALS: Target date: 05/31/2022     Pt will maintain WNL rate of speech during structured practice (oral reading, cognitive exercise, etc.), multi-sentence level, with occasional min-A over 2 sessions.  Baseline: 05-25-22, 06-02-22 Goal status: Met   2.  Pt will self-ID rushes in speech, as they occur, in 50% of opportunities to increase ability to self-correct rate with occasional min-A over 2 sessions.  Baseline:  Goal status: Partially Met (only seen 2 tx visits thus far)   3.  Pt will report completion of dysarthria HEP at least 1x/day over 1 week period with mod-I Baseline:  Goal status: Met   LONG TERM GOALS: Target date: 04/19/76 (recert)   Pt will demonstrate average  WNL rate of speech over 18+ minute conversational sample with no more than 8 rushes of speech over sample period, with occasional min-A.  Baseline: 06-30-22 Goal status: ongoing   2.  Pt will ID and self-correct rushes in rate of speech in 70% of occurences in structured speech practice  (e.g. oral reading) over 2 sessions with rare min-A.  Baseline: 06-20-22, 06-27-22 Goal status: Met   3.  Pt will report improvement via CPIB PROM by 4 points by d/c, indicative subjective improvement in communicative abilities.  Baseline:  Goal status: ongoing    ASSESSMENT:   CLINICAL IMPRESSION: Patient is a 61 y.o. M who was seen today for dysarthria secondary to Parkinson's. Pt presents with volume within gross normal limits but intelligibility intermittently impacted primarily by fast rate of speech. Rate is variable, ranging from normal to occasional rushes. During rushes, also demonstrating initial part word repetitions, in which pt able to correct with increasing accuracy given rare cues today. Conducted ongoing education and training of Speak Out! Principles and dysarthria compensations to optimize carryover of intentional speech to effectively reduce speech rate in conversation with rare to occasional cues required based on daily variations. Speaking with intentionally slow rate has become increasingly more effective in conversation per patient report. Pt would continue to benefit from skilled ST to address motor speech to increase intelligibility and pt's ability to effectively communicate.    OBJECTIVE IMPAIRMENTS  Objective impairments include dysarthria. These impairments are limiting patient from effectively communicating at home and in community.Factors affecting potential to achieve goals and functional outcome are medical prognosis.. Patient will benefit from skilled SLP services to address above impairments and improve overall function.   REHAB POTENTIAL: Good   PLAN: SLP FREQUENCY: 2x/week   SLP DURATION: 8 weeks (+ 4 weeks for recert due to delayed scheduling)   PLANNED INTERVENTIONS: SLP instruction and feedback, Compensatory strategies, and Patient/family education   Marzetta Board, CCC-SLP 07/11/2022, 11:54 AM

## 2022-07-12 ENCOUNTER — Encounter: Payer: Self-pay | Admitting: Neurology

## 2022-07-13 ENCOUNTER — Ambulatory Visit: Payer: Medicare Other

## 2022-07-13 ENCOUNTER — Encounter: Payer: Medicare Other | Admitting: Occupational Therapy

## 2022-07-13 MED ORDER — RYTARY 23.75-95 MG PO CPCR: ORAL_CAPSULE | ORAL | 0 refills | Status: DC

## 2022-07-14 ENCOUNTER — Ambulatory Visit: Payer: Medicare Other

## 2022-07-14 DIAGNOSIS — R471 Dysarthria and anarthria: Secondary | ICD-10-CM | POA: Diagnosis not present

## 2022-07-14 NOTE — Therapy (Signed)
OUTPATIENT SPEECH LANGUAGE PATHOLOGY TREATMENT NOTE (DISCHARGE)   Patient Name: Ian [name] MRN: [id number] DOB:1961/05/31, 61 y.o., male Today's Date: 07/14/2022  PCP: Bernerd Limbo, MD REFERRING PROVIDER: Ludwig Clarks, DO  END OF SESSION:    End of Session - 07/14/22 1104     Visit Number 13    Number of Visits 17    Date for SLP Re-Evaluation 07/29/22    Authorization Type Medicare/Tricare    SLP Start Time 1104   pt arrived late   SLP Stop Time  1135    SLP Time Calculation (min) 31 min    Activity Tolerance Patient tolerated treatment well               SPEECH THERAPY DISCHARGE SUMMARY  Visits from Start of Care: 13  Current functional level related to goals / functional outcomes: Ian [name] presents with improved independent ability to carryover and optimize rate of speech with use of intent and trained techniques. Some inconsistencies noted (likely related to DBS) but pt verbalized understanding of techniques to modify rate and stuttering and able to demonstrate with success. Pt is pleased with current level of functioning and agreeable to ST discharge this date.    Remaining deficits: PD   Education / Equipment: Speak Out! Program, fluency techniques, intentional control of rate   Patient agrees to discharge. Patient goals were met. Patient is being discharged due to meeting the stated rehab goals.      Past Medical History:  Diagnosis Date   Anxiety    History of kidney stones    passed   PD (Parkinson's disease) ([facility]) 09/10/2013   PONV (postoperative nausea and vomiting)    nausea    Past Surgical History:  Procedure Laterality Date   COLONOSCOPY     MINOR PLACEMENT OF FIDUCIAL N/A 01/31/2017   Procedure: MINOR PLACEMENT OF FIDUCIAL;  Surgeon: Erline Levine, MD;  Location: [facility];  Service: Neurosurgery;  Laterality: N/A;  Fiducial placement   none     PULSE GENERATOR IMPLANT Right 02/17/2017   Procedure: Right implantable pulse generator  placement;  Surgeon: Erline Levine, MD;  Location: [facility];  Service: Neurosurgery;  Laterality: Right;  Bilateral implantable pulse generator placement   SUBTHALAMIC STIMULATOR BATTERY REPLACEMENT N/A 10/19/2018   Procedure: Deep Brain stimulator implantable pulse generator revision;  Surgeon: Erline Levine, MD;  Location: [facility];  Service: Neurosurgery;  Laterality: N/A;  Deep Brain stimulator implantable pulse generator revision   SUBTHALAMIC STIMULATOR INSERTION Bilateral 02/10/2017   Procedure: Bilateral Deep brain stimulator placement;  Surgeon: Erline Levine, MD;  Location: [facility];  Service: Neurosurgery;  Laterality: Bilateral;  Bilateral Deep brain stimulator placement   UPPER GASTROINTESTINAL ENDOSCOPY     Patient Active Problem List   Diagnosis Date Noted   Closed fracture of phalanx of left fifth toe 10/19/2021   Tinea versicolor 05/28/2021   Left rotator cuff tear 08/20/2020   Nonallopathic lesion of sacral region 07/25/2018   Nonallopathic lesion of lumbosacral region 07/25/2018   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   Acute bursitis of right shoulder 12/06/2017   Antalgic gait 01/24/2017   Intercostal muscle tear 07/19/2016   PD (Parkinson's disease) ([facility]) 06/24/2016   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 No  OBJECTIVE:   TODAY'S TREATMENT:  07-14-22: Additional education provided re:  carrying over intentional speech rate and mindfulness for breath support to reduce rate and anxiousness while speaking. Pt able to reduce rate with usual fading to rare rushes in conversation today with cued use of abdominal breathing and intentional slow rate. Pt identified personal goals of increased practice in conversation and more public speaking (prayers). CPIB PROM re-administered today, with 10 point improvement reported by patient. Pt verbalized understanding and agreement with SLP recommendations and ST discharge this date.   07-11-22: Pt entered with intermittent fast rate and occasional stutter, which improved with cued diaphragmatic breathing to reduce tension and stutter. SLP utilized modified Speak Out! Exercises to re-focus intent on slow, deliberate speech rate. Pt able to self-correct rushes in speech during oral reading and cognitive exercises with rare min A. Re-educated patient on considering Speech Easy device to aid rate control as PD progresses. Pt verbalized understanding.   07-06-22: Pt entered with fast rate and usual stuttering. SLP re-educated and instructed use of diaphragmatic breathing and utilized modified Speak Out! Exercises to re-focus intent on slow, deliberate speech rate. In subsequent conversation, pt able to optimize speech rate with use of intentional slow speech and self-correct rare rushes in speech with mod I. Discussed how intent can be shifted between slow rate and loudness as loudness apparently reducing when fatigued. SLP recommended continuing Speak Out program as well as increasing focus on conversational opportunities outside of ST.   06-30-22: Pt stated "I've been trying to focus really hard and be intentional with my speech" with success indicated. Good carryover of intentional speech and focused concentration on slow rate in conversation endorsed, with less repetitions requested outside of therapy (ex: only 1 repetition requested over 1 hour personal training  session). Pt able to intermittently self-correct rushes in conversational speech without cues today. Pt continues to complete Speak Out! BID with great benefit endorsed. Recommended increasing repetitions to optimize endurance over longer communication messages as well as utilizing warm up exercises prior to speaking engagements (meetings, appointments, etc). Rare throat clearing noted today with usual implementation of water sips to reduce throat irritation.   06-27-22: Pt enters with rare rushes in speech today. Pt able to recognize rapid rate or rushes in speech with rare min A in short structured conversation. Self-awareness, recognition, and re-attempts become more frequent given SLP education and training. Pt aware how emotions, medications, and fatigue factor into speech production and able to compensate appropriately with occasional min A. SLP continues to educate patient on seeking opportunities to practice conversational level speech to optimize carryover of intentional slow rate of speech.      PATIENT EDUCATION: Education details: see above Person educated: Patient Education method: Education officer, environmental, Verbal cues, and Handouts Education comprehension: verbalized understanding, returned demonstration, verbal cues required, and needs further education     HOME EXERCISE PROGRAM: Speak Out! workbook     GOALS: Goals reviewed with patient? Yes   SHORT TERM GOALS: Target date: 05/31/2022     Pt will maintain WNL rate of speech during structured practice (oral reading, cognitive exercise, etc.), multi-sentence level, with occasional min-A over 2 sessions.  Baseline: 05-25-22, 06-02-22 Goal status: Met   2.  Pt will self-ID rushes in speech, as they occur, in 50% of opportunities to increase ability to self-correct rate with occasional min-A over 2 sessions.  Baseline:  Goal status: Partially Met (only seen 2 tx visits thus far)   3.  Pt will report completion of dysarthria HEP  at least 1x/day over 1 week period with mod-I Baseline:  Goal status: Met   LONG TERM GOALS: Target date: 1/36/43 (recert)   Pt will demonstrate average WNL rate of speech over 18+ minute conversational sample with no more than 8 rushes of speech over sample period, with occasional min-A.  Baseline: 06-30-22, 07-14-22 Goal status: Met   2.  Pt will ID and self-correct rushes in rate of speech in 70% of occurences in structured speech practice (e.g. oral reading) over 2 sessions with rare min-A.  Baseline: 06-20-22, 06-27-22 Goal status: Met   3.  Pt will report improvement via CPIB PROM by 4 points by d/c, indicative subjective improvement in communicative abilities.  Baseline: 10 point improvement  Goal status: Met   ASSESSMENT:   CLINICAL IMPRESSION: Patient is a 61 y.o. M who was seen today for dysarthria secondary to Parkinson's. Pt presents with volume within gross normal limits but intelligibility intermittently impacted primarily by fast rate of speech. Rate is variable, ranging from normal to occasional rushes. During rushes, also demonstrating initial part word repetitions, in which pt able to correct with increasing accuracy given rare cues today. Completed education and training of Speak Out! Principles and dysarthria compensations to optimize carryover of intentional speech to effectively reduce speech rate in conversation with rare to occasional cues required based on daily variations. Speaking with intentionally slow rate has become increasingly more effective in conversation per patient report.  Pt is pleased with current level of functioning and feels confident implementing trained techniques independently.    OBJECTIVE IMPAIRMENTS  Objective impairments include dysarthria. These impairments are limiting patient from effectively communicating at home and in community.Factors affecting potential to achieve goals and functional outcome are medical prognosis.. Patient will benefit  from skilled SLP services to address above impairments and improve overall function.   REHAB POTENTIAL: Good   PLAN: SLP FREQUENCY: 2x/week   SLP DURATION: 8 weeks (+ 4 weeks for recert due to delayed scheduling)   PLANNED INTERVENTIONS: SLP instruction and feedback, Compensatory strategies, and Patient/family education   Marzetta Board, [place] 07/14/2022, 11:35 AM

## 2022-07-30 ENCOUNTER — Encounter: Payer: Self-pay | Admitting: Neurology

## 2022-07-30 ENCOUNTER — Other Ambulatory Visit: Payer: Self-pay | Admitting: Neurology

## 2022-07-30 DIAGNOSIS — G20 Parkinson's disease: Secondary | ICD-10-CM

## 2022-08-01 ENCOUNTER — Other Ambulatory Visit: Payer: Self-pay

## 2022-08-01 DIAGNOSIS — G20A1 Parkinson's disease without dyskinesia, without mention of fluctuations: Secondary | ICD-10-CM

## 2022-08-01 DIAGNOSIS — G20 Parkinson's disease: Secondary | ICD-10-CM

## 2022-08-01 MED ORDER — AMANTADINE HCL 100 MG PO CAPS: 100.0000 mg | ORAL_CAPSULE | Freq: Three times a day (TID) | ORAL | 1 refills | Status: DC

## 2022-08-04 NOTE — Progress Notes (Signed)
  [name] [address] [place] [address] Phone: [phone] Subjective:   [name], am serving as a scribe for Dr. Antoine Primas.  I'm seeing this patient by the request  of:  Tracey Harries, MD  CC: Neck and back pain follow-up  [id number]  Ian [name] is a 61 y.o. male coming in with complaint of back and neck pain. OMT 06/07/2022. Patient states doing well. No new issues.  Medications patient has been prescribed: None        Past Medical History:  Diagnosis Date   Anxiety    History of kidney stones    passed   PD (Parkinson's disease) ([facility]) 09/10/2013   PONV (postoperative nausea and vomiting)    nausea     No Known Allergies   Review of Systems:  No headache, visual changes, nausea, vomiting, diarrhea, constipation, dizziness, abdominal pain, skin rash, fevers, chills, night sweats, weight loss, swollen lymph nodes, body aches, joint swelling, chest pain, shortness of breath, mood changes. POSITIVE muscle aches  Objective  Blood pressure 118/64, pulse 89, height 6\' 2"  (1.88 m), weight 158 lb (71.7 kg), SpO2 97 %.   General: No apparent distress alert and oriented x3 mood and affect normal, dressed appropriately.  HEENT: Pupils equal, extraocular movements intact  Respiratory: Patient's speak in full sentences and does not appear short of breath  Cardiovascular: No lower extremity edema, non tender, no erythema  Gait MSK:  Back back exam does have some loss of lordosis and some tenderness to palpation.  Mild tightness with FABER test.  Seems to be right greater than left.  Osteopathic findings  C2 flexed rotated and side bent right C6 flexed rotated and side bent left T3 extended rotated and side bent right inhaled rib T7 extended rotated and side bent left with inhaled rib T9 extended rotated and side bent left L2 flexed rotated and side bent right Sacrum right on right     Assessment and  Plan:  Slipped rib syndrome Tightness noted again.  Patient has been doing a lot more yard work.  Some tightness noted in the lower back as well.  Still responding extremely well though to osteopathic manipulation.  No change in medication.  Follow-up again in 6 to 8 weeks    Nonallopathic problems  Decision today to treat with OMT was based on Physical Exam  After verbal consent patient was treated with HVLA, ME, FPR techniques in cervical, rib, thoracic, lumbar, and sacral  areas  Patient tolerated the procedure well with improvement in symptoms  Patient given exercises, stretches and lifestyle modifications  See medications in patient instructions if given  Patient will follow up in 4-8 weeks     The above documentation has been reviewed and is accurate and complete , DO         Note: This dictation was prepared with Dragon dictation along with smaller phrase technology. Any transcriptional errors that result from this process are unintentional.

## 2022-08-09 ENCOUNTER — Encounter: Payer: Self-pay | Admitting: Neurology

## 2022-08-09 ENCOUNTER — Ambulatory Visit (INDEPENDENT_AMBULATORY_CARE_PROVIDER_SITE_OTHER): Payer: Medicare Other | Admitting: Family Medicine

## 2022-08-09 ENCOUNTER — Other Ambulatory Visit: Payer: Self-pay | Admitting: Neurology

## 2022-08-09 VITALS — BP 118/64 | HR 89 | Ht 74.0 in | Wt 158.0 lb

## 2022-08-09 DIAGNOSIS — M9908 Segmental and somatic dysfunction of rib cage: Secondary | ICD-10-CM | POA: Diagnosis not present

## 2022-08-09 DIAGNOSIS — M9903 Segmental and somatic dysfunction of lumbar region: Secondary | ICD-10-CM | POA: Diagnosis not present

## 2022-08-09 DIAGNOSIS — M940 Chondrocostal junction syndrome [Tietze]: Secondary | ICD-10-CM

## 2022-08-09 DIAGNOSIS — M9901 Segmental and somatic dysfunction of cervical region: Secondary | ICD-10-CM | POA: Diagnosis not present

## 2022-08-09 DIAGNOSIS — M9902 Segmental and somatic dysfunction of thoracic region: Secondary | ICD-10-CM

## 2022-08-09 DIAGNOSIS — M9904 Segmental and somatic dysfunction of sacral region: Secondary | ICD-10-CM | POA: Diagnosis not present

## 2022-08-09 NOTE — Assessment & Plan Note (Signed)
Tightness noted again.  Patient has been doing a lot more yard work.  Some tightness noted in the lower back as well.  Still responding extremely well though to osteopathic manipulation.  No change in medication.  Follow-up again in 6 to 8 weeks

## 2022-08-09 NOTE — Patient Instructions (Signed)
Did great Stretch after yard work

## 2022-08-23 ENCOUNTER — Encounter: Payer: Self-pay | Admitting: Neurology

## 2022-08-23 DIAGNOSIS — G20 Parkinson's disease: Secondary | ICD-10-CM

## 2022-08-24 MED ORDER — RYTARY 23.75-95 MG PO CPCR: ORAL_CAPSULE | ORAL | 0 refills | Status: DC

## 2022-09-15 NOTE — Progress Notes (Signed)
  [place] Phone: [phone] Subjective:   Ian [name], am serving as a scribe for Dr. Hulan Saas.  I'm seeing this patient by the request  of:  Bernerd Limbo, MD  CC:   [id number]  Ian [name] is a 61 y.o. male coming in with complaint of back and neck pain. OMT 08/09/2022. Patient states that his back is sore today due to performing yardwork yesterday. Has not had any issues otherwise.   Medications patient has been prescribed: None  Taking:         Reviewed prior external information including notes and imaging from previsou exam, outside providers and external EMR if available.   As well as notes that were available from care everywhere and other healthcare systems.  Past medical history, social, surgical and family history all reviewed in electronic medical record.  [name] unless stated regarding to the chief complaint.   Past Medical History:  Diagnosis Date   Anxiety    History of kidney stones    passed   PD (Parkinson's disease) 09/10/2013   PONV (postoperative nausea and vomiting)    nausea     [name]   Review of Systems:  [name] headache, visual changes, nausea, vomiting, diarrhea, constipation, dizziness, abdominal pain, skin rash, fevers, chills, night sweats, weight loss, swollen lymph nodes, body aches, joint swelling, chest pain, shortness of breath, mood changes. POSITIVE muscle aches  Objective  Blood pressure 102/64, pulse 89, height 6\' 2"  (1.88 m), weight 162 lb (73.5 kg), SpO2 99 %.   General: [name] apparent distress alert and oriented x3 mood and affect normal, dressed appropriately.  HEENT: Pupils equal, extraocular movements intact  Respiratory: Patient's speak in full sentences and does not appear short of breath  Cardiovascular: [name] lower extremity edema, non tender, [name] erythema  Gait MSK:  Back significant increase in tightness of  the upper lumbar spine in the lower thoracic spine.  Seems to be mostly in the thoracolumbar juncture.  Seems to be tightness noted of the hip flexors bilaterally.  Mild tightness with FABER test as well.  Osteopathic findings  C2 flexed rotated and side bent right C6 flexed rotated and side bent left T3 extended rotated and side bent right inhaled rib T9 extended rotated and side bent left L2 flexed rotated and side bent right Sacrum right on right     Assessment and Plan:  Hip flexor tendon tightness Patient is to increase activity as tolerated.  Discussed icing regimen and home exercises, which activities to do which ones to avoid.  Patient did respond well to osteopathic manipulation and follow-up again in 6 to 8 weeks    Nonallopathic problems  Decision today to treat with OMT was based on Physical Exam  After verbal consent patient was treated with HVLA, ME, FPR techniques in cervical, rib, thoracic, lumbar, and sacral  areas  Patient tolerated the procedure well with improvement in symptoms  Patient given exercises, stretches and lifestyle modifications  See medications in patient instructions if given  Patient will follow up in 4-8 weeks    The above documentation has been reviewed and is accurate and complete Lyndal Pulley, DO          Note: This dictation was prepared with Dragon dictation along with smaller phrase technology. Any transcriptional errors that result from this process are unintentional.

## 2022-09-20 ENCOUNTER — Encounter: Payer: Self-pay | Admitting: Family Medicine

## 2022-09-20 ENCOUNTER — Ambulatory Visit (INDEPENDENT_AMBULATORY_CARE_PROVIDER_SITE_OTHER): Payer: Medicare Other | Admitting: Family Medicine

## 2022-09-20 VITALS — BP 102/64 | HR 89 | Ht 74.0 in | Wt 162.0 lb

## 2022-09-20 DIAGNOSIS — M24551 Contracture, right hip: Secondary | ICD-10-CM | POA: Diagnosis not present

## 2022-09-20 DIAGNOSIS — M9908 Segmental and somatic dysfunction of rib cage: Secondary | ICD-10-CM | POA: Diagnosis not present

## 2022-09-20 DIAGNOSIS — M9902 Segmental and somatic dysfunction of thoracic region: Secondary | ICD-10-CM | POA: Diagnosis not present

## 2022-09-20 DIAGNOSIS — M24559 Contracture, unspecified hip: Secondary | ICD-10-CM | POA: Insufficient documentation

## 2022-09-20 DIAGNOSIS — M9901 Segmental and somatic dysfunction of cervical region: Secondary | ICD-10-CM | POA: Diagnosis not present

## 2022-09-20 DIAGNOSIS — M9904 Segmental and somatic dysfunction of sacral region: Secondary | ICD-10-CM | POA: Diagnosis not present

## 2022-09-20 DIAGNOSIS — M9903 Segmental and somatic dysfunction of lumbar region: Secondary | ICD-10-CM | POA: Diagnosis not present

## 2022-09-20 NOTE — Patient Instructions (Signed)
Good to see you Tell wife to help with yardwork jk See me in 6 weeks

## 2022-09-20 NOTE — Assessment & Plan Note (Signed)
Patient is to increase activity as tolerated.  Discussed icing regimen and home exercises, which activities to do which ones to avoid.  Patient did respond well to osteopathic manipulation and follow-up again in 6 to 8 weeks

## 2022-10-14 NOTE — Progress Notes (Unsigned)
Assessment/Plan:   1.  Parkinsons Disease  -The patient underwent bilateral STN DBS with [employer] device on 02/10/2017 and had IPG placement on 02/17/2017.    He did have his battery changed on October 19, 2018 to an MRI compatible battery.             -Continue [name], 95 mg, 2 tablets 4 times per day  -Continue amantadine, 100 mg 3 times per day.  I do think it is helping the dyskinesia.  I saw no evidence of dyskinesia today and he is on it.   2.  Fatigue              -Ultimately, this is likely related to the disease, but I am not sure that medications are going to be helpful.  We have tried Provigil, Nuvigil and selegiline, all without relief.  He is exercising.  Today, I did suggest that we go ahead and retry Provigil.  It has been a long time since we tried this.  His daughter's wedding is upcoming and I really would like him to feel well for this.  He agreed to that he would like to retry it.  He was given a prescription for 200 mg in the morning.  We could always go up to 400 mg if needed.  3.  REM behavior disorder.             -On clonazepam 0.5 mg, half tablet at night.  PDMP has been reviewed.  No red flags.  Last filled August 10, 2022.   Subjective:   Ian [name] was seen today in follow up for Parkinsons disease.  My previous records were reviewed prior to todays visit as well as outside records available to me.  I had him retry Provigil last visit, primarily because of the profound fatigue and the fact that his daughter was getting married and he wanted to feel well.  He reports that ***he is not taking that medication.  He has been doing OMT with Dr. Katrinka Blazing.  Notes reviewed.  Current prescribed movement disorder medications: [name] 95 mg, 2 tablets 4 times per day  Clonazepam 0.5 mg, half tablet at night Opicapone, 50 mg (started last visit)  Amantadine, 100 mg 3 times per day.   PREVIOUS MEDICATIONS:  Requip; Neupro (tried only a few days); propranolol  helped tremor some; Inbrija (samples given but not taken); Provigil; Nuvigil; selegiline (no help for fatigue); opicapone (increased dyskinesia); amantadine    ALLERGIES:  No Known Allergies  CURRENT MEDICATIONS:  Outpatient Encounter Medications as of 10/17/2022  Medication Sig   amantadine (SYMMETREL) 100 MG capsule TAKE 1 CAPSULE BY MOUTH IN THE MORNING, AT NOON, AND AT BEDTIME   amantadine (SYMMETREL) 100 MG capsule Take 1 capsule (100 mg total) by mouth in the morning, at noon, and at bedtime.   Ascorbic Acid (VITAMIN C PO) Take 3 tablets by mouth 3 (three) times daily.    b complex vitamins tablet Take 1 tablet by mouth 2 (two) times daily.   Carbidopa-Levodopa ER ([name]) 23.75-95 MG CPCR TAKE 2 CAPSULES FOUR TIMES A DAY   clonazePAM (KLONOPIN) 0.5 MG tablet TAKE 1/2 TABLET(0.25 MG) BY MOUTH AT BEDTIME   ketoconazole (NIZORAL) 2 % cream Apply 1 application topically daily. To affected areas.   Melatonin 3 MG CAPS Take 3 mg by mouth at bedtime.   modafinil (PROVIGIL) 200 MG tablet Take 1 tablet (200 mg total) by mouth every morning.   terbinafine (LAMISIL) 1 %  cream Apply 1 application topically 2 (two) times daily.   No facility-administered encounter medications on file as of 10/17/2022.    Objective:   PHYSICAL EXAMINATION:    VITALS:   There were no vitals filed for this visit.     GEN:  The patient appears stated age and is in NAD. HEENT:  Normocephalic, atraumatic.    Neurological examination:  Orientation: The patient is alert and oriented x3. Cranial nerves: There is good facial symmetry with minimal facial hypomimia.  Extraocular muscles are intact.  The speech is fluent and mildly dysarthric.  He is just slightly hypophonic.  Soft palate rises symmetrically and there is no tongue deviation. Hearing is intact to conversational tone. Sensation: Sensation is intact to light touch throughout Motor: Strength is 5/5 in the bilateral upper extremities.  No pronator  drift.  Movement examination: Tone: There is normal tone on the left.  He has mild increased tone in the right upper extremity (stable) Abnormal movements: There is no tremor or dyskinesia today. Coordination:  There is no decremation with any form of RAMS, including alternating supination and pronation of the forearm, hand opening and closing, finger taps, heel taps and toe taps. Gait and Station: The patient is able to ambulate well.  He has good stride length.  He has good arm swing today bilaterally.  I have reviewed and interpreted the following labs independently    Chemistry      Component Value Date/Time   NA 141 10/15/2018 1533   K 4.4 10/15/2018 1533   CL 105 10/15/2018 1533   CO2 28 10/15/2018 1533   BUN 16 10/15/2018 1533   CREATININE 1.05 10/15/2018 1533   CREATININE 0.91 11/25/2014 1051      Component Value Date/Time   CALCIUM 9.7 10/15/2018 1533   ALKPHOS 69 06/10/2015 1155   AST 27 06/10/2015 1155   ALT 40 06/10/2015 1155   BILITOT 1.0 06/10/2015 1155       Lab Results  Component Value Date   WBC 6.5 10/15/2018   HGB 15.4 10/15/2018   HCT 48.2 10/15/2018   MCV 91.6 10/15/2018   PLT 302 10/15/2018    No results found for: "TSH"    Cc:  Bernerd Limbo, MD

## 2022-10-17 ENCOUNTER — Encounter: Payer: Self-pay | Admitting: Neurology

## 2022-10-17 ENCOUNTER — Ambulatory Visit (INDEPENDENT_AMBULATORY_CARE_PROVIDER_SITE_OTHER): Payer: Medicare Other | Admitting: Neurology

## 2022-10-17 VITALS — BP 115/74 | HR 81 | Ht 75.0 in | Wt 161.4 lb

## 2022-10-17 DIAGNOSIS — G4752 REM sleep behavior disorder: Secondary | ICD-10-CM

## 2022-10-17 DIAGNOSIS — G20B2 Parkinson's disease with dyskinesia, with fluctuations: Secondary | ICD-10-CM | POA: Diagnosis not present

## 2022-10-17 DIAGNOSIS — R5383 Other fatigue: Secondary | ICD-10-CM | POA: Diagnosis not present

## 2022-10-17 DIAGNOSIS — G20A1 Parkinson's disease without dyskinesia, without mention of fluctuations: Secondary | ICD-10-CM

## 2022-10-17 MED ORDER — RYTARY 23.75-95 MG PO CPCR: ORAL_CAPSULE | ORAL | 0 refills | Status: DC

## 2022-10-17 MED ORDER — AMANTADINE HCL 100 MG PO CAPS: 100.0000 mg | ORAL_CAPSULE | Freq: Two times a day (BID) | ORAL | 1 refills | Status: DC

## 2022-10-17 NOTE — Procedures (Signed)
DBS Programming was performed.    Manufacturer of DBS device: [employer]  Total time spent programming was 8 minutes.  Device was confirmed to be on.  Soft start was confirmed to be on.  Impedences were checked and were within normal limits.  Battery was checked and was determined to be functioning normally and not near the end of life.  Final settings were as follows:    Active Contact Amplitude (mA) PW (ms) Frequency (hz) Side Effects  Left Brain       11/23/20 3-(90%)4-(10%)C+ 3.8 (2.8-4.0) 60 130   04/26/21 3-(90%)4-(10%)C+ 3.8 60 130   11/02/21 3-(90%)4-(10%)C+ 3.8 60 130   04/11/22 3-(90%)4-(10%)C+ 3.8 60 130   10/17/22 3-(90%)4-(10%)C+ 3.8 60 130                 Right Brain       11/23/20 2-(30%)3-(70%)C+ 3.2 60 130 Tried PW of 70 but not tolerated (?dizzy)  04/26/21 2-(30%)3-(70%)C+ 3.2 60 130   11/02/21 2-(30%)3-(70%)C+ 3.1 60 130   04/11/22 2-(30%)3-(70%)C+ 3.2 60 130   10/17/22 2-(30%)3-(70%)C+ 3.2 60 130

## 2022-10-17 NOTE — Patient Instructions (Signed)
Local and Online Resources for Power over Parkinson's Group  October 2023    LOCAL [place] PARKINSON'S GROUPS   Power over Parkinson's Group:    Power Over Parkinson's Patient Education Group will be Wednesday, October 11th-*Hybrid meting*- in person at [facility] location and via [facility] at 2 pm.   Upcoming Power over [employer] Meetings:  2nd Wednesdays of the month at 2 pm:   October 11th, November 8th, December 13th  Contact Amy Marriott at [email or web address] if interested in participating in this group    [place]! Moves [employer] Instructor-Led Classes offering at [employer]!  TUESDAYS and Wednesdays 1-2 pm.   Contact Vonna Kotyk at  [email or web address] or Caron Presume at [place], [email or web address]  Dance for Parkinson 's classes will be on Tuesdays 9:30am-10:30am starting October 3-December 12 with a break the week of November 21st. Located in the [employer]  which is in the first floor of the [employer] ([place].) To register:  [email or web address] or [phone]  Drumming for Parkinson's will be held on 2nd and 4th Mondays at 11:00 am.   Located at the [place] ([place]  [place] at [email or web address] or [phone]  Through support from the [place] for Parkinson's classes are free for both patients and caregivers.    [employer] [employer] Parkinson's Tai Chi Class, Mondays at 11 am.  Call [phone] for details   [place]:  [email or web address]  PD Health at Home continues:  Mindfulness Mondays, Wellness Wednesdays, Fitness Fridays   Upcoming Education:    Parkinson's 101:  What you and your family should know.  Wednesday, Oct. 4th 1-2 pm  Expert Briefing:     Parkinson's and the Gut-Brain Connection.   Wednesday, Oct. 11th 1-2 pm  Hallucinations and Delusions in Parkinson's.  Wednesday, Nov. 8th, 1-2 pm  Register for expert briefings (webinars) at [email or web address]  Please check out their website to sign up for emails and see their full online offerings      [place]:  [email or web address]   Third Thursday Webinars:  On the third Thursday of every month at 12 p.m. ET, join our free live webinars to learn about various aspects of living with Parkinson's disease and our work to speed medical breakthroughs.  Upcoming Webinar:  Surveyor, mining for [employer]. (Replay).  Thursday, Oct. 12th at 12 noon  Check out additional information on their website to see their full online offerings    [place]:  [email or web address]  Upcoming Webinar:   Stay tuned  Webinar Series:  Living with Parkinson's Meetup.   Third Thursdays each month, 3 pm  Care Partner Monthly Meetup.  With Robin Searing Phinney.  First Tuesday of each month, 2 pm  Check out additional information to Live Well Today on their website    Parkinson and Movement Disorders (PMD) Alliance:  www.[email or web address]  [employer]uroLife Online:  Online Education Events  Sign up for emails, which are sent weekly to give you updates on programming and online offerings    Parkinson's Association of the [place]:  www.[email or web address]  Information on online support groups, education events, and online exercises including Yoga, Parkinson's exercises and more-LOTS of information on links to PD resources and online events  Virtual Support Group through Parkinson's Association of the [place]; next one is scheduled for Wednesday, October 4th at 2 pm.  (  These are typically scheduled for the 1st Wednesday of the month at 2 pm).  Visit website for details.   MOVEMENT AND EXERCISE OPPORTUNITIES  PWR! Moves Classes  at [place].  Wednesdays 10 and 11 am.   Contact Amy Marriott, PT [email or web address] if interested.  NEW PWR! Moves Class offerings at [employer].  *TUESDAYS* and Wednesdays 1-2 pm.  Contact Vonna Kotyk at  [email or web address] or Caron Presume at [place],  [email or web address]  Parkinson's Wellness Recovery (PWR! Moves)  www.[email or web address]  Info on the PWR! Virtual Experience:  You will have access to our expertise?through self-assessment, guided plans that start with the PD-specific fundamentals, educational content, tips, Q&A with an expert, and a growing Art therapist of PD-specific pre-recorded and live exercise classes of varying types and intensity - both physical and cognitive! If that is not enough, we offer 1:1 wellness consultations (in-person or virtual) to personalize your PWR! Research scientist (medical).   [place] Fridays:   As part of the PD Health @ Home program, this free video series focuses each week on one aspect of fitness designed to support people living with Parkinson's.? These weekly videos highlight the Bennett fitness guidelines for people with Parkinson's disease.  [email or web address]  Dance for PD website is offering free, live-stream classes throughout the week, as well as links to [employer] of classes:  [email or web address]  Virtual dance and Pilates for Parkinson's classes: Click on the Community Tab> Parkinson's Movement Initiative Tab.  To register for classes and for more information, visit [email or web address] and click the "community" tab.   [employer] Parkinson's Cycling Classes   [employer]:  Thursdays @ Noon-Live classes at [employer] ([employer] at [email or web address]?or [phone])  [facility]: Virtual Classes Mondays and Thursdays Jeanette Caprice classes Tuesday, Wednesday and Thursday (contact [place] at  [email or web address] ?or [phone])  [place]  Varied levels of classes are offered Tuesdays and Thursdays at [employer].   Stretching with Verdis Frederickson weekly class is also offered for people with Parkinson's  To observe a class or for more information, call [phone] or email Hezzie Bump at [email or web address]   ADDITIONAL SUPPORT AND RESOURCES  Well-Spring [employer]:Online Caregiver Education Opportunities:  www.well-[email or web address]/caregiver-education/caregiver-support-group.  You may also contact Vickki Muff at [email or web address] or [phone].     Well-Spring Navigator:  Just1Navigator program, a?free service to help individuals and families through the journey of determining care for older adults.  The "Navigator" is a Education officer, museum, Arnell Asal, who will speak with a prospective client and/or loved ones to provide an assessment of the situation and a set of recommendations for a personalized care plan -- all free of charge, and whether?Well-Spring [employer] offers the needed service or not. If the need is not a service we provide, we are well-connected with reputable programs in town that we can refer you to.  www.well-springsolutions.org or to speak with the Navigator, call [phone].

## 2022-10-18 ENCOUNTER — Other Ambulatory Visit: Payer: Self-pay

## 2022-10-18 DIAGNOSIS — G20B2 Parkinson's disease with dyskinesia, with fluctuations: Secondary | ICD-10-CM

## 2022-10-27 NOTE — Progress Notes (Signed)
  [name] [address] [place] [address] Phone: [phone] Subjective:   [name], am serving as a scribe for Dr. Antoine Primas.  I'm seeing this patient by the request  of:  Tracey Harries, MD  CC: back and neck pain follow up   [id number]  Ian [name] is a 61 y.o. male coming in with complaint of back and neck pain. OMT 09/20/2022. Patient states that he has been doing well sine last visit.   Medications patient has been prescribed: None  Taking:         Reviewed prior external information including notes and imaging from previsou exam, outside providers and external EMR if available.   As well as notes that were available from care everywhere and other healthcare systems.  Past medical history, social, surgical and family history all reviewed in electronic medical record.  No pertanent information unless stated regarding to the chief complaint.   Past Medical History:  Diagnosis Date   Anxiety    History of kidney stones    passed   PD (Parkinson's disease) 09/10/2013   PONV (postoperative nausea and vomiting)    nausea     No Known Allergies   Review of Systems:  No headache, visual changes, nausea, vomiting, diarrhea, constipation, dizziness, abdominal pain, skin rash, fevers, chills, night sweats, weight loss, swollen lymph nodes, body aches, joint swelling, chest pain, shortness of breath, mood changes. POSITIVE muscle aches  Objective  Pulse 82, height 6\' 3"  (1.905 m), weight 163 lb (73.9 kg), SpO2 97 %.   General: No apparent distress alert and oriented x3 mood and affect normal, dressed appropriately.  HEENT: Pupils equal, extraocular movements intact  Respiratory: Patient's speak in full sentences and does not appear short of breath  Cardiovascular: No lower extremity edema, non tender, no erythema  Gait normal  MSK:  Back conitnue have tightness noted.  Patient does have some tenderness to  palpation noted.  Negative FABER test.  Mild hypertonicity noted.  Tightness of the hip flexors noted as well.  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  Continues to be active.  Has had tightness more in the paraspinal musculature of the lumbar spine and in the thoracolumbar juncture than usual.  Patient overall does relatively well.  Follow-up again in 6 to 8 weeks    Nonallopathic problems  Decision today to treat with OMT was based on Physical Exam  After verbal consent patient was treated with HVLA, ME, FPR techniques in cervical, rib, thoracic, lumbar, and sacral  areas  Patient tolerated the procedure well with improvement in symptoms  Patient given exercises, stretches and lifestyle modifications  See medications in patient instructions if given  Patient will follow up in 4-8 weeks     The above documentation has been reviewed and is accurate and complete , DO         Note: This dictation was prepared with Dragon dictation along with smaller phrase technology. Any transcriptional errors that result from this process are unintentional.

## 2022-11-01 ENCOUNTER — Ambulatory Visit (INDEPENDENT_AMBULATORY_CARE_PROVIDER_SITE_OTHER): Payer: Medicare Other | Admitting: Family Medicine

## 2022-11-01 VITALS — HR 82 | Ht 75.0 in | Wt 163.0 lb

## 2022-11-01 DIAGNOSIS — M9904 Segmental and somatic dysfunction of sacral region: Secondary | ICD-10-CM

## 2022-11-01 DIAGNOSIS — M9902 Segmental and somatic dysfunction of thoracic region: Secondary | ICD-10-CM

## 2022-11-01 DIAGNOSIS — M9903 Segmental and somatic dysfunction of lumbar region: Secondary | ICD-10-CM | POA: Diagnosis not present

## 2022-11-01 DIAGNOSIS — M940 Chondrocostal junction syndrome [Tietze]: Secondary | ICD-10-CM

## 2022-11-01 DIAGNOSIS — M9901 Segmental and somatic dysfunction of cervical region: Secondary | ICD-10-CM

## 2022-11-01 DIAGNOSIS — M9908 Segmental and somatic dysfunction of rib cage: Secondary | ICD-10-CM | POA: Diagnosis not present

## 2022-11-01 NOTE — Assessment & Plan Note (Signed)
Still secondary to likely the Parkinson syndrome but is doing much better overall.  Continues to be active.  Has had tightness more in the paraspinal musculature of the lumbar spine and in the thoracolumbar juncture than usual.  Patient overall does relatively well.  Follow-up again in 6 to 8 weeks

## 2022-11-01 NOTE — Patient Instructions (Signed)
Good to see you  Thank you for understanding  Hip flexor exercises given  Follow up in 6 weeks

## 2022-11-29 ENCOUNTER — Other Ambulatory Visit: Payer: Self-pay | Admitting: Neurology

## 2022-11-29 ENCOUNTER — Encounter: Payer: Self-pay | Admitting: Neurology

## 2022-11-29 ENCOUNTER — Other Ambulatory Visit: Payer: Self-pay

## 2022-11-29 DIAGNOSIS — G20A1 Parkinson's disease without dyskinesia, without mention of fluctuations: Secondary | ICD-10-CM

## 2022-11-29 MED ORDER — RYTARY 23.75-95 MG PO CPCR: ORAL_CAPSULE | ORAL | 0 refills | Status: DC

## 2022-12-06 NOTE — Progress Notes (Signed)
[name] [address] [place] [address] Phone: [phone] Subjective:    I'm seeing this patient by the request  of:  Tracey Harries, MD  CC: Back and neck pain follow-up  [id number]  Ian [name] is a 61 y.o. male coming in with complaint of back and neck pain. OMT 11/01/2022. Patient states that he's pretty good , tennis elbow left elbow wants checked out , yard work flared been going on a couple of weeks   Medications patient has been prescribed: None  Taking:         Reviewed prior external information including notes and imaging from previsou exam, outside providers and external EMR if available.   As well as notes that were available from care everywhere and other healthcare systems.  Past medical history, social, surgical and family history all reviewed in electronic medical record.  No pertanent information unless stated regarding to the chief complaint.   Past Medical History:  Diagnosis Date   Anxiety    History of kidney stones    passed   PD (Parkinson's disease) 09/10/2013   PONV (postoperative nausea and vomiting)    nausea     No Known Allergies   Review of Systems:  No headache, visual changes, nausea, vomiting, diarrhea, constipation, dizziness, abdominal pain, skin rash, fevers, chills, night sweats, weight loss, swollen lymph nodes, body aches, joint swelling, chest pain, shortness of breath, mood changes. POSITIVE muscle aches  Objective  Blood pressure 108/70, height 6\' 3"  (1.905 m), weight 169 lb (76.7 kg).   General: No apparent distress alert and oriented x3 mood and affect normal, dressed appropriately.  HEENT: Pupils equal, extraocular movements intact  Respiratory: Patient's speak in full sentences and does not appear short of breath  Cardiovascular: No lower extremity edema, non tender, no erythema  Patient does have some hypertonicity noted.  Seems to be a little bit more than patient's  baseline    patient does have some limited sidebending bilaterally.  Increasing tightness noted with straight leg test and .  Patient does have some worsening tremor noted in the left lower extremity.  Osteopathic findings  C3 flexed rotated and side bent left C5 flexed rotated and side bent left T4 extended rotated and side bent left inhaled rib T8 extended rotated and side bent left L2 flexed rotated and side bent right L5 flexed rotated and side bent left Sacrum right on right       Assessment and Plan:  Slipped rib syndrome Mild worsening with slipped rib syndrome.  Patient is having increasing his hip flexor tightness as well and does have some mild increase in the dyskinesis with the Parkinson's.  Patient does have the brain stimulator.  Discussed which activities to do and which ones to avoid.  No changes in medication.  Patient knows of worsening symptoms to come back sooner again.  Patient will be following up with neurology as well for the underlying Parkinson's.  Follow-up again in 6 to 8 weeks    Nonallopathic problems  Decision today to treat with OMT was based on Physical Exam  After verbal consent patient was treated with HVLA, ME, FPR techniques in cervical, rib, thoracic, lumbar, and sacral  areas  Patient tolerated the procedure well with improvement in symptoms  Patient given exercises, stretches and lifestyle modifications  See medications in patient instructions if given  Patient will follow up in 4-8 weeks     The above documentation has been reviewed and is  accurate and complete Lyndal Pulley, DO         Note: This dictation was prepared with Dragon dictation along with smaller phrase technology. Any transcriptional errors that result from this process are unintentional.

## 2022-12-14 ENCOUNTER — Ambulatory Visit (INDEPENDENT_AMBULATORY_CARE_PROVIDER_SITE_OTHER): Payer: Medicare Other | Admitting: Family Medicine

## 2022-12-14 VITALS — BP 108/70 | Ht 75.0 in | Wt 169.0 lb

## 2022-12-14 DIAGNOSIS — M9902 Segmental and somatic dysfunction of thoracic region: Secondary | ICD-10-CM

## 2022-12-14 DIAGNOSIS — M9901 Segmental and somatic dysfunction of cervical region: Secondary | ICD-10-CM

## 2022-12-14 DIAGNOSIS — M9903 Segmental and somatic dysfunction of lumbar region: Secondary | ICD-10-CM

## 2022-12-14 DIAGNOSIS — M9904 Segmental and somatic dysfunction of sacral region: Secondary | ICD-10-CM

## 2022-12-14 DIAGNOSIS — M940 Chondrocostal junction syndrome [Tietze]: Secondary | ICD-10-CM

## 2022-12-14 DIAGNOSIS — M9908 Segmental and somatic dysfunction of rib cage: Secondary | ICD-10-CM

## 2022-12-14 NOTE — Assessment & Plan Note (Signed)
Mild worsening with slipped rib syndrome.  Patient is having increasing his hip flexor tightness as well and does have some mild increase in the dyskinesis with the Parkinson's.  Patient does have the brain stimulator.  Discussed which activities to do and which ones to avoid.  No changes in medication.  Patient knows of worsening symptoms to come back sooner again.  Patient will be following up with neurology as well for the underlying Parkinson's.  Follow-up again in 6 to 8 weeks

## 2022-12-14 NOTE — Patient Instructions (Signed)
Don't lift anything overhand 6-8 week follow up

## 2023-01-19 NOTE — Progress Notes (Signed)
Ian [name] [address] [place] Phone: [phone] Subjective:   IVilma Meckel, am serving as a scribe for Dr. Hulan Saas.  I'm seeing this patient by the request  of:  Bernerd Limbo, MD  CC: Neck and back pain follow-up  [id number]  Chael Urenda is a 62 y.o. male coming in with complaint of back and neck pain. OMT 12/14/2022. Patient states doing well since last visit. No new issues.  Some tightness here and there but nothing severe.  Medications patient has been prescribed: None  Taking:         Reviewed prior external information including notes and imaging from previsou exam, outside providers and external EMR if available.   As well as notes that were available from care everywhere and other healthcare systems.  Past medical history, social, surgical and family history all reviewed in electronic medical record.  No pertanent information unless stated regarding to the chief complaint.   Past Medical History:  Diagnosis Date   Anxiety    History of kidney stones    passed   PD (Parkinson's disease) 09/10/2013   PONV (postoperative nausea and vomiting)    nausea     No Known Allergies   Review of Systems:  No headache, visual changes, nausea, vomiting, diarrhea, constipation, dizziness, abdominal pain, skin rash, fevers, chills, night sweats, weight loss, swollen lymph nodes, body aches, joint swelling, chest pain, shortness of breath, mood changes. POSITIVE muscle aches  Objective  Blood pressure 122/76, pulse 94, height 6\' 3"  (1.905 m), weight 172 lb (78 kg), SpO2 97 %.   General: No apparent distress alert and oriented x3 mood and affect normal, dressed appropriately.  HEENT: Pupils equal, extraocular movements intact  Respiratory: Patient's speak in full sentences and does not appear short of breath  Cardiovascular: No lower extremity edema, non tender, no erythema  Loss of lordosis of the lumbar  spine and very mild hypertonicity noted.  Patient does have a mild tremor noted in the left lower extremity but nothing severe at this time.  Osteopathic findings  C2 flexed rotated and side bent right T3 extended rotated and side bent right inhaled rib T9 extended rotated and side bent right with inhaled rib L1 flexed rotated and side bent right Sacrum right on right     Assessment and Plan:  Slipped rib syndrome Chronic problem still secondary to the hypertonicity noted.  Did have some mild tightness also of the hip flexor right greater than left.  Discussed posture and ergonomics, which activities to do and which ones to avoid, increase activity slowly.  Discussed icing regimen.  Follow-up again in 6 to 8 weeks  Lateral epicondylitis Patient at the end did not still have some complaints noted.  Discussed wearing the wrist brace on a more regular basis.  If worsening pain will need to consider the possibility of injection.  Follow-up with me again in 6 to 8 weeks.    Nonallopathic problems  Decision today to treat with OMT was based on Physical Exam  After verbal consent patient was treated with HVLA, ME, FPR techniques in cervical, rib, thoracic, lumbar, and sacral  areas  Patient tolerated the procedure well with improvement in symptoms  Patient given exercises, stretches and lifestyle modifications  See medications in patient instructions if given  Patient will follow up in 4-8 weeks    The above documentation has been reviewed and is accurate and complete Lyndal Pulley, DO  Note: This dictation was prepared with Dragon dictation along with smaller phrase technology. Any transcriptional errors that result from this process are unintentional.

## 2023-01-24 ENCOUNTER — Ambulatory Visit (INDEPENDENT_AMBULATORY_CARE_PROVIDER_SITE_OTHER): Payer: Medicare Other | Admitting: Family Medicine

## 2023-01-24 VITALS — BP 122/76 | HR 94 | Ht 75.0 in | Wt 172.0 lb

## 2023-01-24 DIAGNOSIS — M7712 Lateral epicondylitis, left elbow: Secondary | ICD-10-CM | POA: Diagnosis not present

## 2023-01-24 DIAGNOSIS — M9908 Segmental and somatic dysfunction of rib cage: Secondary | ICD-10-CM

## 2023-01-24 DIAGNOSIS — M9901 Segmental and somatic dysfunction of cervical region: Secondary | ICD-10-CM | POA: Diagnosis not present

## 2023-01-24 DIAGNOSIS — M940 Chondrocostal junction syndrome [Tietze]: Secondary | ICD-10-CM

## 2023-01-24 DIAGNOSIS — M9904 Segmental and somatic dysfunction of sacral region: Secondary | ICD-10-CM

## 2023-01-24 DIAGNOSIS — M9902 Segmental and somatic dysfunction of thoracic region: Secondary | ICD-10-CM | POA: Diagnosis not present

## 2023-01-24 DIAGNOSIS — M9903 Segmental and somatic dysfunction of lumbar region: Secondary | ICD-10-CM

## 2023-01-24 NOTE — Assessment & Plan Note (Signed)
Chronic problem still secondary to the hypertonicity noted.  Did have some mild tightness also of the hip flexor right greater than left.  Discussed posture and ergonomics, which activities to do and which ones to avoid, increase activity slowly.  Discussed icing regimen.  Follow-up again in 6 to 8 weeks

## 2023-01-24 NOTE — Assessment & Plan Note (Signed)
Patient at the end did not still have some complaints noted.  Discussed wearing the wrist brace on a more regular basis.  If worsening pain will need to consider the possibility of injection.  Follow-up with me again in 6 to 8 weeks.

## 2023-01-24 NOTE — Patient Instructions (Signed)
Good to see you! See you again in 6 weeks 

## 2023-02-01 IMAGING — DX DG FOOT COMPLETE 3+V*L*
3 series · 3 of 3 positions shown · non-contrast
Comparison: None.

CLINICAL DATA: Left fifth digit pain after stubbing toe.

EXAM:
LEFT FOOT - COMPLETE 3+ VIEW

[foot ap]
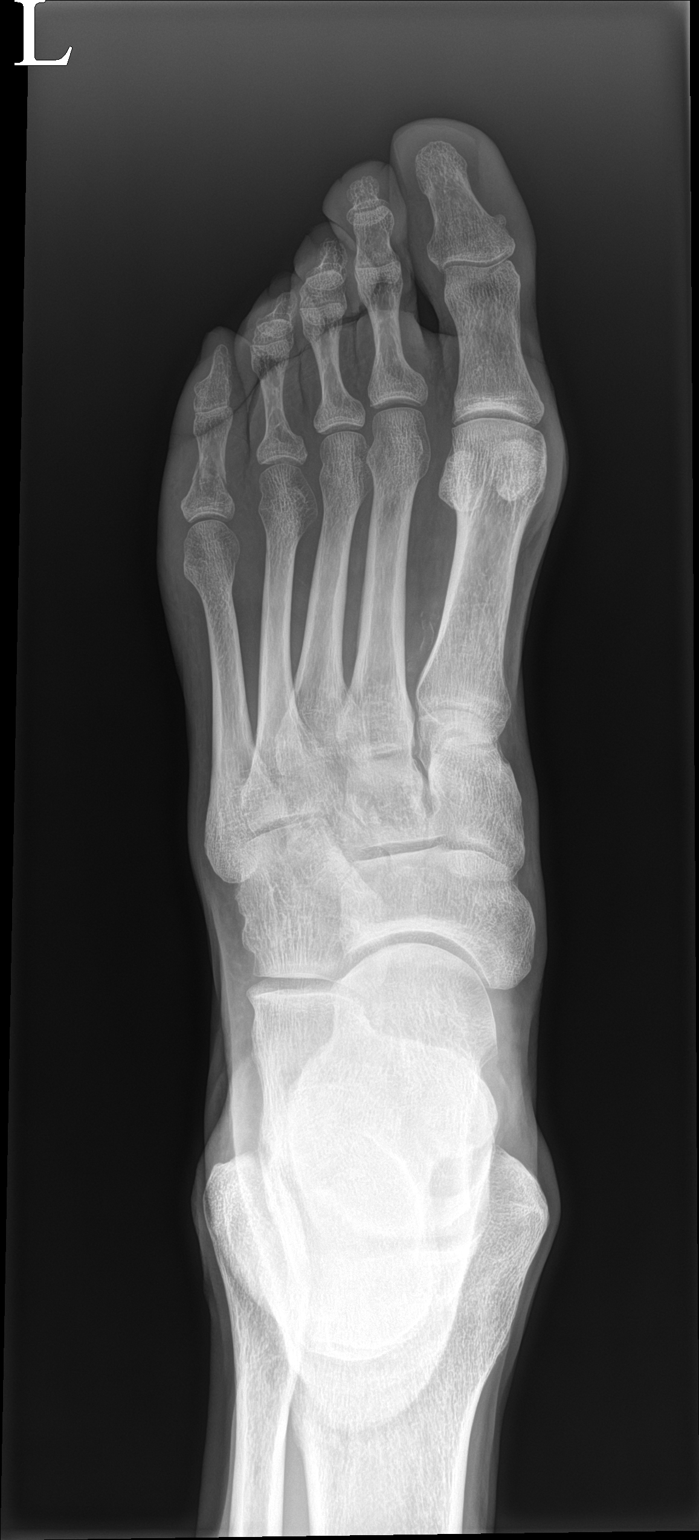

[foot obl]
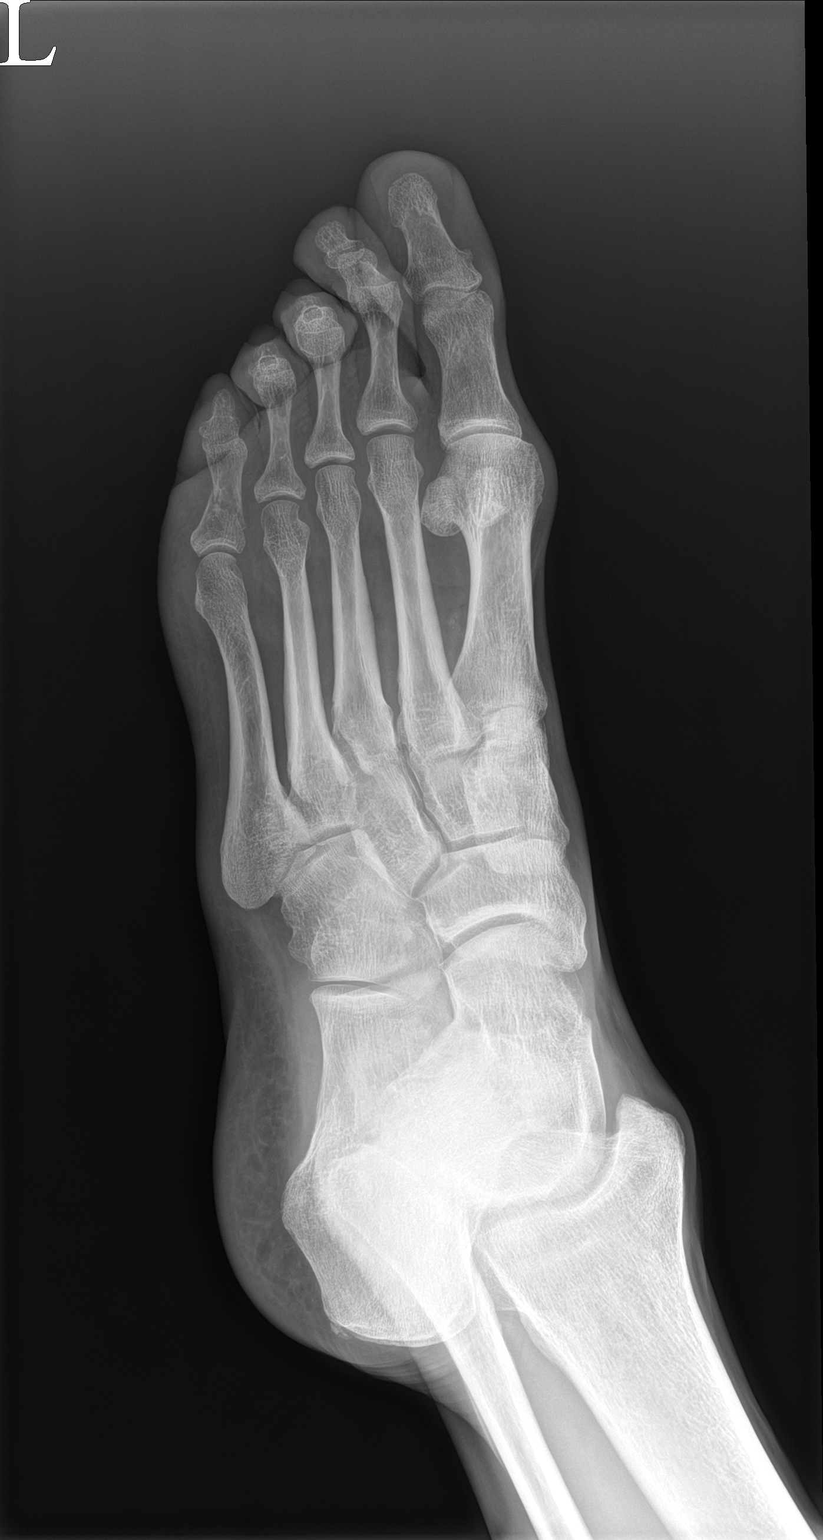

[foot lat]
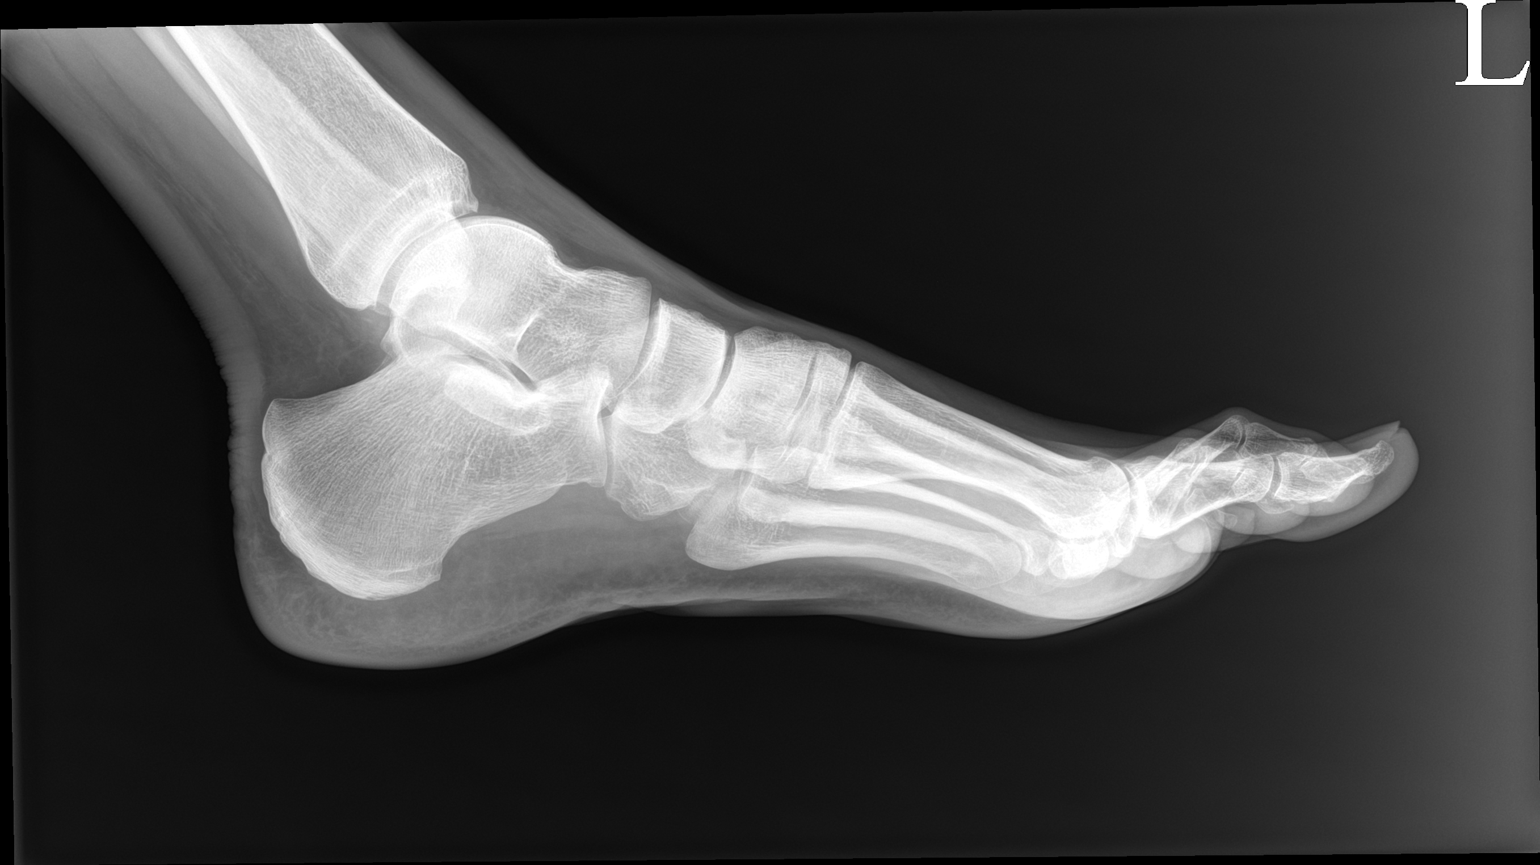

[3 of 3 positions shown; findings below may reference images not displayed]

FINDINGS: There is an acute oblique fracture through the proximal aspect of
the fifth proximal phalanx. This appears nondisplaced. There is
surrounding soft tissue swelling. Joint spaces are maintained. There
is no dislocation.
IMPRESSION: 1. Acute fracture fifth proximal phalanx.

## 2023-02-02 ENCOUNTER — Other Ambulatory Visit: Payer: Self-pay | Admitting: Neurology

## 2023-02-02 ENCOUNTER — Encounter: Payer: Self-pay | Admitting: Neurology

## 2023-02-03 ENCOUNTER — Other Ambulatory Visit: Payer: Self-pay

## 2023-02-03 ENCOUNTER — Other Ambulatory Visit: Payer: Self-pay | Admitting: Neurology

## 2023-02-08 ENCOUNTER — Other Ambulatory Visit: Payer: Self-pay

## 2023-02-09 MED ORDER — CLONAZEPAM 0.5 MG PO TABS: ORAL_TABLET | ORAL | 1 refills | Status: DC

## 2023-02-20 ENCOUNTER — Encounter: Payer: Self-pay | Admitting: Neurology

## 2023-02-21 ENCOUNTER — Encounter: Payer: Self-pay | Admitting: Neurology

## 2023-02-28 NOTE — Therapy (Signed)
[name] [address] [place], [place], [address] Phone: [phone]   Fax:  [phone]  Patient Details  Name: Ian [name] MRN: [id number] Date of Birth: 1961-03-18 Referring Provider:  Bernerd Limbo, MD  Encounter Date: 03/02/2023  Occupational Therapy Parkinson's Disease Screen  Hand dominance:  Rt   Physical Performance Test item #2 (simulated eating):  13.37 sec (last bean slipped)  Physical Performance Test item #4 (donning/doffing jacket):  9 sec  Fastening/unfastening 3 buttons in:  18.99 sec  9-hole peg test:    RUE  24.28 sec     LUE  26.73 sec  Change in ability to perform ADLs/IADLs:  denies changes in ADLS and golf  Other Comments:  pt able to write 3 sentences maintaining legibility and size  Pt does not require occupational therapy services at this time.  Recommended occupational therapy screen in 6 months   Ian [name], OT 02/28/2023, 11:59 AM  [facility] [address] [place], [place], [address] Phone: [phone]   Fax:  [phone]

## 2023-03-01 ENCOUNTER — Other Ambulatory Visit: Payer: Self-pay | Admitting: Neurology

## 2023-03-01 DIAGNOSIS — G20A1 Parkinson's disease without dyskinesia, without mention of fluctuations: Secondary | ICD-10-CM

## 2023-03-01 MED ORDER — RYTARY 23.75-95 MG PO CPCR: ORAL_CAPSULE | ORAL | 0 refills | Status: DC

## 2023-03-01 NOTE — Therapy (Unsigned)
[facility] [address] [place], [place], [address] Phone: [phone]   Fax:  [phone]  Patient Details  Name: [name] MRN: [id number] Date of Birth: [birth year] Referring Provider:  Bernerd Limbo, MD  Encounter Date: 03/02/2023  Speech Therapy Parkinson's Disease Screen   Decibel Level today: 73 dB  (WNL=70-72 dB) with sound level meter 30cm away from pt's mouth. Pt's conversational volume has maintained since last treatment course. Pt's primary barrier to speech intelligibility continues to be his increased rate. SLP advised of HEP, pt feels equipped to return to HEP at this time. Does not feel he needs ST interventions.   Pt does not report difficulty with swallowing, which does not warrant further evaluation  Pt  does not report change in cognitive function  Pt does does not require speech therapy services at this time. Recommend ST screen in another 6-8 months   Su Monks, [place] 03/02/2023, [facility] [address] [place], [place], [address] Phone: [phone]   Fax:  [phone]

## 2023-03-02 ENCOUNTER — Ambulatory Visit: Payer: Medicare Other | Admitting: Occupational Therapy

## 2023-03-02 ENCOUNTER — Ambulatory Visit: Payer: Medicare Other | Attending: Family Medicine | Admitting: Physical Therapy

## 2023-03-02 ENCOUNTER — Ambulatory Visit: Payer: Medicare Other | Admitting: Speech Pathology

## 2023-03-02 DIAGNOSIS — R278 Other lack of coordination: Secondary | ICD-10-CM | POA: Insufficient documentation

## 2023-03-02 DIAGNOSIS — R471 Dysarthria and anarthria: Secondary | ICD-10-CM | POA: Insufficient documentation

## 2023-03-02 DIAGNOSIS — R29818 Other symptoms and signs involving the nervous system: Secondary | ICD-10-CM | POA: Insufficient documentation

## 2023-03-02 NOTE — Therapy (Addendum)
[facility] [address] [place], [place], [address] Phone: [phone]   Fax:  [phone]  Patient Details  Name: Ian [name] MRN: [id number] Date of Birth: 1961-06-04 Referring Provider:  Bernerd Limbo, MD  Encounter Date: 03/02/2023  Physical Therapy Parkinson's Disease Screen   Timed Up and Go test: 5.75 seconds (previously 6.9 seconds)  10 meter walk test: 7.75 seconds = 4.23 ft/sec (previously 4.48 ft/sec)  5 time sit to stand test: 9 seconds  (previously 9.7 seconds)  No falls. Reports things are good right now. Doing PWR classes weekly, spin classes, Tai Chi, golf on Fridays, and personal training 2x a week.    Patient does not require Physical Therapy services at this time.  Recommend Physical Therapy screen in 6 months.      Arliss Journey, PT, DPT 03/02/2023, 7:55 AM  [facility] [address] [place], [place], [address] Phone: [phone]   Fax:  [phone]

## 2023-03-06 NOTE — Progress Notes (Unsigned)
  Ian [name] [address] [place] Phone: [phone] Subjective:   Ian [name], am serving as a scribe for Dr. Hulan Saas.  I'm seeing this patient by the request  of:  Bernerd Limbo, MD  CC: Back and neck pain  [id number]  Ian [name] is a 62 y.o. male coming in with complaint of back and neck pain> OMT 01/24/2023. Patient states doing well. Elbow is feeling better. Here for manipulation. No new concerns.  Medications patient has been prescribed: None  Taking:         Reviewed prior external information including notes and imaging from previsou exam, outside providers and external EMR if available.   As well as notes that were available from care everywhere and other healthcare systems.  Past medical history, social, surgical and family history all reviewed in electronic medical record.  No pertanent information unless stated regarding to the chief complaint.   Past Medical History:  Diagnosis Date   Anxiety    History of kidney stones    passed   PD (Parkinson's disease) 09/10/2013   PONV (postoperative nausea and vomiting)    nausea     No Known Allergies   Review of Systems:  No headache, visual changes, nausea, vomiting, diarrhea, constipation, dizziness, abdominal pain, skin rash, fevers, chills, night sweats, weight loss, swollen lymph nodes, body aches, joint swelling, chest pain, shortness of breath, mood changes. POSITIVE muscle aches  Objective  Blood pressure 104/64, pulse 94, height 6\' 2"  (1.88 m), weight 173 lb (78.5 kg), SpO2 98 %.   General: No apparent distress alert and oriented x3 mood and affect normal, dressed appropriately.  HEENT: Pupils equal, extraocular movements intact  Respiratory: Patient's speak in full sentences and does not appear short of breath  Cardiovascular: No lower extremity edema, non tender, no erythema  Hypertonicity of multiple muscles noted.  Neck exam does have  some limited sidebending.  No cogwheeling noted noted today.  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  Patient has made significant strides over the course of time with significant increase in strength.  Patient has been able to increase activity without any significant difficulty.  Follow-up with me again 6 to 8 weeks.  Parkinson's disease (Ian [name]) Continuing to give [place] difficulty but overall patient has made significant strides and has shown    Nonallopathic problems  Decision today to treat with OMT was based on Physical Exam  After verbal consent patient was treated with HVLA, ME, FPR techniques in cervical, rib, thoracic, lumbar, and sacral  areas  Patient tolerated the procedure well with improvement in symptoms  Patient given exercises, stretches and lifestyle modifications  See medications in patient instructions if given  Patient will follow up in 4-8 weeks     The above documentation has been reviewed and is accurate and complete Ian Pulley, DO         Note: This dictation was prepared with Dragon dictation along with smaller phrase technology. Any transcriptional errors that result from this process are unintentional.

## 2023-03-07 ENCOUNTER — Ambulatory Visit (INDEPENDENT_AMBULATORY_CARE_PROVIDER_SITE_OTHER): Payer: Medicare Other | Admitting: Family Medicine

## 2023-03-07 ENCOUNTER — Encounter: Payer: Self-pay | Admitting: Family Medicine

## 2023-03-07 VITALS — BP 104/64 | HR 94 | Ht 74.0 in | Wt 173.0 lb

## 2023-03-07 DIAGNOSIS — M9903 Segmental and somatic dysfunction of lumbar region: Secondary | ICD-10-CM

## 2023-03-07 DIAGNOSIS — M9904 Segmental and somatic dysfunction of sacral region: Secondary | ICD-10-CM

## 2023-03-07 DIAGNOSIS — M940 Chondrocostal junction syndrome [Tietze]: Secondary | ICD-10-CM | POA: Diagnosis not present

## 2023-03-07 DIAGNOSIS — M9908 Segmental and somatic dysfunction of rib cage: Secondary | ICD-10-CM

## 2023-03-07 DIAGNOSIS — M9901 Segmental and somatic dysfunction of cervical region: Secondary | ICD-10-CM

## 2023-03-07 DIAGNOSIS — M9902 Segmental and somatic dysfunction of thoracic region: Secondary | ICD-10-CM

## 2023-03-07 NOTE — Patient Instructions (Signed)
Good to see you!  You know the drill See you again in 6-8 weeks

## 2023-03-07 NOTE — Assessment & Plan Note (Signed)
Patient responded extremely well to osteopathic manipulation today.  Patient has made significant strides over the course of time with significant increase in strength.  Patient has been able to increase activity without any significant difficulty.  Follow-up with me again 6 to 8 weeks.

## 2023-03-07 NOTE — Assessment & Plan Note (Signed)
Continuing to give [place] difficulty but overall patient has made significant strides and has shown

## 2023-04-11 ENCOUNTER — Other Ambulatory Visit: Payer: Self-pay

## 2023-04-11 ENCOUNTER — Encounter: Payer: Self-pay | Admitting: Neurology

## 2023-04-11 DIAGNOSIS — G20A1 Parkinson's disease without dyskinesia, without mention of fluctuations: Secondary | ICD-10-CM

## 2023-04-11 MED ORDER — AMANTADINE HCL 100 MG PO CAPS: 100.0000 mg | ORAL_CAPSULE | Freq: Two times a day (BID) | ORAL | 0 refills | Status: DC

## 2023-04-19 NOTE — Progress Notes (Deleted)
  [name] [address] [place] [address] Phone: [phone] Subjective:    I'm seeing this patient by the request  of:  Tracey Harries, MD  CC:   [id number]  Ian [name] is a 62 y.o. male coming in with complaint of back and neck pain. OMT 03/07/2023. Patient states   Medications patient has been prescribed: None  Taking:         Reviewed prior external information including notes and imaging from previsou exam, outside providers and external EMR if available.   As well as notes that were available from care everywhere and other healthcare systems.  Past medical history, social, surgical and family history all reviewed in electronic medical record.  No pertanent information unless stated regarding to the chief complaint.   Past Medical History:  Diagnosis Date   Anxiety    History of kidney stones    passed   PD (Parkinson's disease) 09/10/2013   PONV (postoperative nausea and vomiting)    nausea     No Known Allergies   Review of Systems:  No headache, visual changes, nausea, vomiting, diarrhea, constipation, dizziness, abdominal pain, skin rash, fevers, chills, night sweats, weight loss, swollen lymph nodes, body aches, joint swelling, chest pain, shortness of breath, mood changes. POSITIVE muscle aches  Objective  There were no vitals taken for this visit.   General: No apparent distress alert and oriented x3 mood and affect normal, dressed appropriately.  HEENT: Pupils equal, extraocular movements intact  Respiratory: Patient's speak in full sentences and does not appear short of breath  Cardiovascular: No lower extremity edema, non tender, no erythema  Gait MSK:  Back   Osteopathic findings  C2 flexed rotated and side bent right C6 flexed rotated and side bent left T3 extended rotated and side bent right inhaled rib T9 extended rotated and side bent left L2 flexed rotated and side bent right Sacrum right  on right       Assessment and Plan:  No problem-specific Assessment & Plan notes found for this encounter.    Nonallopathic problems  Decision today to treat with OMT was based on Physical Exam  After verbal consent patient was treated with HVLA, ME, FPR techniques in cervical, rib, thoracic, lumbar, and sacral  areas  Patient tolerated the procedure well with improvement in symptoms  Patient given exercises, stretches and lifestyle modifications  See medications in patient instructions if given  Patient will follow up in 4-8 weeks             Note: This dictation was prepared with Dragon dictation along with smaller phrase technology. Any transcriptional errors that result from this process are unintentional.

## 2023-04-20 ENCOUNTER — Encounter: Payer: Medicare Other | Admitting: Neurology

## 2023-04-20 ENCOUNTER — Ambulatory Visit: Payer: Medicare Other | Admitting: Family Medicine

## 2023-04-26 NOTE — Progress Notes (Unsigned)
  [name] [address] [place] [address] Phone: [phone] Subjective:   Ian [name], am serving as a scribe for Dr. Antoine Primas.  I'm seeing this patient by the request  of:  Tracey Harries, MD  CC: Neck and back pain follow-up  [id number]  Ian [name] is a 62 y.o. male coming in with complaint of back and neck pain. OMT on 03/07/2023.  Patient states overall doing relatively well.  Wants to make sure he is doing significantly better because he is going to be expecting a grandfather soon.  Patient is home exercises which she has been doing intermittently.  Medications patient has been prescribed:   Taking:         Reviewed prior external information including notes and imaging from previsou exam, outside providers and external EMR if available.   As well as notes that were available from care everywhere and other healthcare systems.  Past medical history, social, surgical and family history all reviewed in electronic medical record.  No pertanent information unless stated regarding to the chief complaint.   Past Medical History:  Diagnosis Date   Anxiety    History of kidney stones    passed   PD (Parkinson's disease) 09/10/2013   PONV (postoperative nausea and vomiting)    nausea     No Known Allergies   Review of Systems:  No headache, visual changes, nausea, vomiting, diarrhea, constipation, dizziness, abdominal pain, skin rash, fevers, chills, night sweats, weight loss, swollen lymph nodes, body aches, joint swelling, chest pain, shortness of breath, mood changes. POSITIVE muscle aches  Objective  Blood pressure 120/80, pulse 82, height 6\' 2"  (1.88 m), weight 169 lb (76.7 kg), SpO2 98 %.   General: No apparent distress alert and oriented x3 mood and affect normal, dressed appropriately.  HEENT: Pupils equal, extraocular movements intact  Respiratory: Patient's speak in full sentences and does not appear short  of breath  Cardiovascular: No lower extremity edema, non tender, no erythema  Patient still has some mild hypertonicity noted.  Neck exam does have some difficulty with sidebending bilaterally.  Osteopathic findings  C2 flexed rotated and side bent right C6 flexed rotated and side bent left T3 extended rotated and side bent right inhaled rib T9 extended rotated and side bent left L2 flexed rotated and side bent right Sacrum right on right       Assessment and Plan:  Slipped rib syndrome Still has some difficulty with this Lipsett rib syndrome.  Did respond well to osteopathic manipulation today.  Once again discussed the scapular exercises.  We discussed the stability exercises.  Follow-up again in 6 to 8 weeks otherwise.    Nonallopathic problems  Decision today to treat with OMT was based on Physical Exam  After verbal consent patient was treated with HVLA, ME, FPR techniques in cervical, rib, thoracic, lumbar, and sacral  areas  Patient tolerated the procedure well with improvement in symptoms  Patient given exercises, stretches and lifestyle modifications  See medications in patient instructions if given  Patient will follow up in 4-8 weeks    The above documentation has been reviewed and is accurate and complete Judi Saa, DO          Note: This dictation was prepared with Dragon dictation along with smaller phrase technology. Any transcriptional errors that result from this process are unintentional.

## 2023-04-27 ENCOUNTER — Ambulatory Visit (INDEPENDENT_AMBULATORY_CARE_PROVIDER_SITE_OTHER): Payer: Medicare Other | Admitting: Family Medicine

## 2023-04-27 ENCOUNTER — Encounter: Payer: Self-pay | Admitting: Family Medicine

## 2023-04-27 VITALS — BP 120/80 | HR 82 | Ht 74.0 in | Wt 169.0 lb

## 2023-04-27 DIAGNOSIS — M9901 Segmental and somatic dysfunction of cervical region: Secondary | ICD-10-CM | POA: Diagnosis not present

## 2023-04-27 DIAGNOSIS — M9903 Segmental and somatic dysfunction of lumbar region: Secondary | ICD-10-CM

## 2023-04-27 DIAGNOSIS — M940 Chondrocostal junction syndrome [Tietze]: Secondary | ICD-10-CM

## 2023-04-27 DIAGNOSIS — M9902 Segmental and somatic dysfunction of thoracic region: Secondary | ICD-10-CM

## 2023-04-27 DIAGNOSIS — M9904 Segmental and somatic dysfunction of sacral region: Secondary | ICD-10-CM | POA: Diagnosis not present

## 2023-04-27 DIAGNOSIS — M9908 Segmental and somatic dysfunction of rib cage: Secondary | ICD-10-CM

## 2023-04-27 NOTE — Patient Instructions (Signed)
Great to see you Congrats See me in 6-8 weeks

## 2023-04-27 NOTE — Assessment & Plan Note (Signed)
Still has some difficulty with this Lipsett rib syndrome.  Did respond well to osteopathic manipulation today.  Once again discussed the scapular exercises.  We discussed the stability exercises.  Follow-up again in 6 to 8 weeks otherwise.

## 2023-05-08 ENCOUNTER — Encounter: Payer: Self-pay | Admitting: Neurology

## 2023-05-09 ENCOUNTER — Other Ambulatory Visit: Payer: Self-pay

## 2023-05-09 MED ORDER — CLONAZEPAM 0.5 MG PO TABS: ORAL_TABLET | ORAL | 1 refills | Status: DC

## 2023-05-09 NOTE — Progress Notes (Unsigned)
Assessment/Plan:   1.  Parkinsons Disease  -The patient underwent bilateral STN DBS with [employer] device on 02/10/2017 and had IPG placement on 02/17/2017.    He did have his battery changed on October 19, 2018 to an MRI compatible battery.             -Continue [name], 95 mg, 2 tablets 3-4 times per day  -Continue amantadine.  He has backed down to twice per day, primarily because he backed down on [name] some.  He is doing fine.   2.  Fatigue              -Ultimately, this is likely related to the disease.  He exercises.  He has tried Nuvigil, Provigil, selegiline all without relief.    3.  REM behavior disorder.             -On clonazepam 0.5 mg, half tablet at night.  PDMP has been reviewed.  No red flags.  This was just filled a few days ago.   Subjective:   Ian [name] was seen today in follow up for Parkinsons disease.  My previous records were reviewed prior to todays visit as well as outside records available to me.  Generally, the patient is doing fairly well from a Parkinson's standpoint.  Fatigue persists, unfortunately.  Provigil didn't help so he d/c it.  He continues to exercise.  He has not had falls.  His daughter is pregnant and is due July 5.  They are excited about that.  Current prescribed movement disorder medications: [name] 95 mg, 2 tablets 3 times per day Clonazepam 0.5 mg, half tablet at night Amantadine, 100 mg 2 times per day  Provigil prn (once per week usually)   PREVIOUS MEDICATIONS:  Requip; Neupro (tried only a few days); propranolol helped tremor some; Inbrija (samples given but not taken); Provigil; Nuvigil; selegiline (no help for fatigue); opicapone (increased dyskinesia); amantadine ; opicapone   ALLERGIES:  No Known Allergies  CURRENT MEDICATIONS:  Outpatient Encounter Medications as of 05/11/2023  Medication Sig   amantadine (SYMMETREL) 100 MG capsule Take 1 capsule (100 mg total) by mouth 2 (two) times daily.   Ascorbic Acid  (VITAMIN C PO) Take 3 tablets by mouth 3 (three) times daily.    b complex vitamins tablet Take 1 tablet by mouth 2 (two) times daily.   Carbidopa-Levodopa ER ([name]) 23.75-95 MG CPCR TAKE 2 CAPSULES 4 TIMES A DAY   Carbidopa-Levodopa ER ([name]) 23.75-95 MG CPCR TAKE 2 CAPSULES FOUR TIMES A DAY   clonazePAM (KLONOPIN) 0.5 MG tablet TAKE 1/2 TABLET(0.25 MG) BY MOUTH AT BEDTIME   ketoconazole (NIZORAL) 2 % cream Apply 1 application topically daily. To affected areas.   Melatonin 3 MG CAPS Take 3 mg by mouth at bedtime.   modafinil (PROVIGIL) 200 MG tablet Take 1 tablet (200 mg total) by mouth every morning.   terbinafine (LAMISIL) 1 % cream Apply 1 application topically 2 (two) times daily.   [DISCONTINUED] clonazePAM (KLONOPIN) 0.5 MG tablet TAKE 1/2 TABLET(0.25 MG) BY MOUTH AT BEDTIME   No facility-administered encounter medications on file as of 05/11/2023.    Objective:   PHYSICAL EXAMINATION:    VITALS:   Vitals:   05/11/23 1328  BP: 116/70  Pulse: 62  SpO2: 98%  Weight: 168 lb 9.6 oz (76.5 kg)  Height: 6\' 3"  (1.905 m)        GEN:  The patient appears stated age and is in NAD. HEENT:  Normocephalic, atraumatic.    Neurological examination:  Orientation: The patient is alert and oriented x3. Cranial nerves: There is good facial symmetry with minimal facial hypomimia.  Extraocular muscles are intact.  The speech is fluent and with stuttering quality.   He is dysarthric.  Soft palate rises symmetrically and there is no tongue deviation. Hearing is intact to conversational tone. Sensation: Sensation is intact to light touch throughout Motor: Strength is at least antigravity x4  Movement examination: Tone: There is initially mild to moderate increased tone in the right upper extremity, but once his medication kicked in, there was no increased tone in the right arm and it normalized. Abnormal movements: There is initially no dyskinesia.  He took his medication right when I  walked in and did develop some dyskinesia about 10 to 15 minutes into the visit. Coordination:  There is no decremation with any form of RAMS, including alternating supination and pronation of the forearm, hand opening and closing, finger taps, heel taps and toe taps bilaterally. Gait and Station: The patient is able to ambulate well.  He has good stride length.  He has good arm swing today bilaterally.  He is able to turn easily and swiftly.  I have reviewed and interpreted the following labs independently    Chemistry      Component Value Date/Time   NA 141 10/15/2018 1533   K 4.4 10/15/2018 1533   CL 105 10/15/2018 1533   CO2 28 10/15/2018 1533   BUN 16 10/15/2018 1533   CREATININE 1.05 10/15/2018 1533   CREATININE 0.91 11/25/2014 1051      Component Value Date/Time   CALCIUM 9.7 10/15/2018 1533   ALKPHOS 69 06/10/2015 1155   AST 27 06/10/2015 1155   ALT 40 06/10/2015 1155   BILITOT 1.0 06/10/2015 1155       Lab Results  Component Value Date   WBC 6.5 10/15/2018   HGB 15.4 10/15/2018   HCT 48.2 10/15/2018   MCV 91.6 10/15/2018   PLT 302 10/15/2018    No results found for: "TSH"  Total time spent on today's visit was 35 minutes, including both face-to-face time and nonface-to-face time.  Time included that spent on review of records (prior notes available to me/labs/imaging if pertinent), discussing treatment and goals, answering patient's questions and coordinating care.   Cc:  Tracey Harries, MD

## 2023-05-11 ENCOUNTER — Ambulatory Visit (INDEPENDENT_AMBULATORY_CARE_PROVIDER_SITE_OTHER): Payer: Medicare Other | Admitting: Neurology

## 2023-05-11 ENCOUNTER — Encounter: Payer: Self-pay | Admitting: Neurology

## 2023-05-11 VITALS — BP 116/70 | HR 62 | Ht 75.0 in | Wt 168.6 lb

## 2023-05-11 DIAGNOSIS — R471 Dysarthria and anarthria: Secondary | ICD-10-CM

## 2023-05-11 DIAGNOSIS — R498 Other voice and resonance disorders: Secondary | ICD-10-CM

## 2023-05-11 DIAGNOSIS — Z9689 Presence of other specified functional implants: Secondary | ICD-10-CM

## 2023-05-11 DIAGNOSIS — G20B2 Parkinson's disease with dyskinesia, with fluctuations: Secondary | ICD-10-CM

## 2023-05-11 DIAGNOSIS — R5383 Other fatigue: Secondary | ICD-10-CM

## 2023-05-11 NOTE — Patient Instructions (Signed)
Great to see you today.   

## 2023-05-11 NOTE — Procedures (Signed)
DBS Programming was performed.    Manufacturer of DBS device: [employer]  Total time spent programming was 10 minutes.  Device was confirmed to be on.  Soft start was confirmed to be on.  Impedences were checked and were within normal limits.  Battery was checked and was determined to be functioning normally and not near the end of life.  Final settings were as follows:    Active Contact Amplitude (mA) PW (ms) Frequency (hz) Side Effects  Left Brain       11/23/20 3-(90%)4-(10%)C+ 3.8 (2.8-4.0) 60 130   04/26/21 3-(90%)4-(10%)C+ 3.8 60 130   11/02/21 3-(90%)4-(10%)C+ 3.8 60 130   04/11/22 3-(90%)4-(10%)C+ 3.8 60 130   10/17/22 3-(90%)4-(10%)C+ 3.8 60 130   05/11/23 3-(90%)4-(10%)C+ 3.8 60 130                 Right Brain       11/23/20 2-(30%)3-(70%)C+ 3.2 60 130 Tried PW of 70 but not tolerated (?dizzy)  04/26/21 2-(30%)3-(70%)C+ 3.2 60 130   11/02/21 2-(30%)3-(70%)C+ 3.1 60 130   04/11/22 2-(30%)3-(70%)C+ 3.2 60 130   10/17/22 2-(30%)3-(70%)C+ 3.2 60 130   05/11/23 2-(30%)3-(70%)C+ 3.2 60 130

## 2023-06-05 NOTE — Progress Notes (Unsigned)
  [name] [address] [place] [address] Phone: [phone] Subjective:   Ian [name], am serving as a scribe for Dr. Antoine Primas.  I'm seeing this patient by the request  of:  Tracey Harries, MD  CC: Upper back pain follow-up  [id number]  Ian [name] is a 62 y.o. male coming in with complaint of back and neck pain. OMT 04/27/2023. Patient states that he has some tightness in L side of cervical spine.   Medications patient has been prescribed: None  Taking:         Reviewed prior external information including notes and imaging from previsou exam, outside providers and external EMR if available.   As well as notes that were available from care everywhere and other healthcare systems.  Past medical history, social, surgical and family history all reviewed in electronic medical record.  No pertanent information unless stated regarding to the chief complaint.   Past Medical History:  Diagnosis Date   Anxiety    History of kidney stones    passed   PD (Parkinson's disease) 09/10/2013   PONV (postoperative nausea and vomiting)    nausea     No Known Allergies   Review of Systems:  No headache, visual changes, nausea, vomiting, diarrhea, constipation, dizziness, abdominal pain, skin rash, fevers, chills, night sweats, weight loss, swollen lymph nodes, body aches, joint swelling, chest pain, shortness of breath, mood changes. POSITIVE muscle aches  Objective  Blood pressure 116/68, pulse 79, height 6\' 3"  (1.905 m), weight 168 lb (76.2 kg), SpO2 93 %.   General: No apparent distress alert and oriented x3 mood and affect normal, dressed appropriately.  HEENT: Pupils equal, extraocular movements intact  Respiratory: Patient's speak in full sentences and does not appear short of breath  Cardiovascular: No lower extremity edema, non tender, no erythema  Gait normal overall MSK:  Back hypertonicity noted of some surrounding  musculature.  Osteopathic findings  C2 flexed rotated and side bent right C7 flexed rotated and side bent left T3 extended rotated and side bent right inhaled rib T6 extended rotated and side bent right T7 extended rotated and side bent left inhaled rib L2 flexed rotated and side bent right Sacrum right on right     Assessment and Plan:  Slipped rib syndrome Continues to have tightness but to be more of the left paraspinal musculature restrained.  Discussed with patient about icing regimen and home exercises.  Discussed which activities to do and which ones to avoid.  Increase activity slowly.  Follow-up with me again in 6 to 8 weeks no changes in medications at this time.    Nonallopathic problems  Decision today to treat with OMT was based on Physical Exam  After verbal consent patient was treated with HVLA, ME, FPR techniques in cervical, rib, thoracic, lumbar, and sacral  areas  Patient tolerated the procedure well with improvement in symptoms  Patient given exercises, stretches and lifestyle modifications  See medications in patient instructions if given  Patient will follow up in 4-8 weeks    The above documentation has been reviewed and is accurate and complete Judi Saa, DO          Note: This dictation was prepared with Dragon dictation along with smaller phrase technology. Any transcriptional errors that result from this process are unintentional.

## 2023-06-06 ENCOUNTER — Encounter: Payer: Self-pay | Admitting: Neurology

## 2023-06-06 ENCOUNTER — Encounter: Payer: Self-pay | Admitting: Family Medicine

## 2023-06-06 ENCOUNTER — Ambulatory Visit (INDEPENDENT_AMBULATORY_CARE_PROVIDER_SITE_OTHER): Payer: Medicare Other | Admitting: Family Medicine

## 2023-06-06 ENCOUNTER — Other Ambulatory Visit: Payer: Self-pay | Admitting: Neurology

## 2023-06-06 VITALS — BP 116/68 | HR 79 | Ht 75.0 in | Wt 168.0 lb

## 2023-06-06 DIAGNOSIS — M940 Chondrocostal junction syndrome [Tietze]: Secondary | ICD-10-CM | POA: Diagnosis not present

## 2023-06-06 DIAGNOSIS — M9904 Segmental and somatic dysfunction of sacral region: Secondary | ICD-10-CM

## 2023-06-06 DIAGNOSIS — G20A1 Parkinson's disease without dyskinesia, without mention of fluctuations: Secondary | ICD-10-CM

## 2023-06-06 DIAGNOSIS — M9908 Segmental and somatic dysfunction of rib cage: Secondary | ICD-10-CM

## 2023-06-06 DIAGNOSIS — M9903 Segmental and somatic dysfunction of lumbar region: Secondary | ICD-10-CM | POA: Diagnosis not present

## 2023-06-06 DIAGNOSIS — M9901 Segmental and somatic dysfunction of cervical region: Secondary | ICD-10-CM | POA: Diagnosis not present

## 2023-06-06 DIAGNOSIS — M9902 Segmental and somatic dysfunction of thoracic region: Secondary | ICD-10-CM | POA: Diagnosis not present

## 2023-06-06 NOTE — Patient Instructions (Signed)
Good to see you Can't wait to see pic of baby See me again in 7-8 weeks

## 2023-06-06 NOTE — Assessment & Plan Note (Signed)
Continues to have tightness but to be more of the left paraspinal musculature restrained.  Discussed with patient about icing regimen and home exercises.  Discussed which activities to do and which ones to avoid.  Increase activity slowly.  Follow-up with me again in 6 to 8 weeks no changes in medications at this time.

## 2023-07-26 NOTE — Progress Notes (Unsigned)
  [name] [address] [place] [address] Phone: [phone] Subjective:   [name], am serving as a scribe for Dr. Antoine Primas.  I'm seeing this patient by the request  of:  Tracey Harries, MD  CC: Back and neck pain follow-up  [id number]  Amin Dorrough is a 62 y.o. male coming in with complaint of back and neck pain. OMT 06/06/2023. Patient states that he has good and bad days.   Medications patient has been prescribed: None  Taking:         Reviewed prior external information including notes and imaging from previsou exam, outside providers and external EMR if available.   As well as notes that were available from care everywhere and other healthcare systems.  Past medical history, social, surgical and family history all reviewed in electronic medical record.  No pertanent information unless stated regarding to the chief complaint.   Past Medical History:  Diagnosis Date   Anxiety    History of kidney stones    passed   PD (Parkinson's disease) 09/10/2013   PONV (postoperative nausea and vomiting)    nausea     No Known Allergies   Review of Systems:  No headache, visual changes, nausea, vomiting, diarrhea, constipation, dizziness, abdominal pain, skin rash, fevers, chills, night sweats, weight loss, swollen lymph nodes, joint swelling, chest pain, shortness of breath, mood changes. POSITIVE muscle aches, body aches  Objective  Blood pressure 102/68, pulse (!) 59, height 6\' 3"  (1.905 m), weight 169 lb (76.7 kg), SpO2 97%.   General: No apparent distress alert and oriented x3 mood and affect normal, dressed appropriately.  HEENT: Pupils equal, extraocular movements intact  Respiratory: Patient's speak in full sentences and does not appear short of breath  Cardiovascular: No lower extremity edema, non tender, no erythema  Does have some tightness noted in the parascapular area right greater than left.  Patient  does have what feels like a slight bruit noted.  Osteopathic findings  C2 flexed rotated and side bent right C7 flexed rotated and side bent left T3 extended rotated and side bent right inhaled rib T9 extended rotated and side bent left L2 flexed rotated and side bent right L5 flexed rotated and side bent left Sacrum right on right       Assessment and Plan:  Slipped rib syndrome Continued to have slipped rib syndrome secondary to the tightness from his Parkinson's.  Has been no very active.  Nothing that is stopping him from activity.  Continuing to stay strong.  Where possible.  Does respond well though to osteopathic manipulation.  Do feel that we can change intervals to 2 to 24-month intervals at this time.    Nonallopathic problems  Decision today to treat with OMT was based on Physical Exam  After verbal consent patient was treated with HVLA, ME, FPR techniques in cervical, rib, thoracic, lumbar, and sacral  areas does sound  Patient tolerated the procedure well with improvement in symptoms  Patient given exercises, stretches and lifestyle modifications  See medications in patient instructions if given  Patient will follow up in 4-8 weeks     The above documentation has been reviewed and is accurate and complete Judi Saa, DO         Note: This dictation was prepared with Dragon dictation along with smaller phrase technology. Any transcriptional errors that result from this process are unintentional.

## 2023-07-27 ENCOUNTER — Ambulatory Visit (INDEPENDENT_AMBULATORY_CARE_PROVIDER_SITE_OTHER): Payer: Medicare Other | Admitting: Family Medicine

## 2023-07-27 ENCOUNTER — Encounter: Payer: Self-pay | Admitting: Family Medicine

## 2023-07-27 VITALS — BP 102/68 | HR 59 | Ht 75.0 in | Wt 169.0 lb

## 2023-07-27 DIAGNOSIS — M9903 Segmental and somatic dysfunction of lumbar region: Secondary | ICD-10-CM | POA: Diagnosis not present

## 2023-07-27 DIAGNOSIS — M9901 Segmental and somatic dysfunction of cervical region: Secondary | ICD-10-CM | POA: Diagnosis not present

## 2023-07-27 DIAGNOSIS — M940 Chondrocostal junction syndrome [Tietze]: Secondary | ICD-10-CM | POA: Diagnosis not present

## 2023-07-27 DIAGNOSIS — M9904 Segmental and somatic dysfunction of sacral region: Secondary | ICD-10-CM | POA: Diagnosis not present

## 2023-07-27 DIAGNOSIS — M9908 Segmental and somatic dysfunction of rib cage: Secondary | ICD-10-CM

## 2023-07-27 DIAGNOSIS — M9902 Segmental and somatic dysfunction of thoracic region: Secondary | ICD-10-CM | POA: Diagnosis not present

## 2023-07-27 NOTE — Patient Instructions (Signed)
Thanks for making my job easy Stay active See me in 7-8 weeks

## 2023-07-27 NOTE — Assessment & Plan Note (Signed)
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  Has been no very active.  Nothing that is stopping him from activity.  Continuing to stay strong.  Where possible.  Does respond well though to osteopathic manipulation.  Do feel that we can change intervals to 2 to 64-month intervals at this time.

## 2023-08-22 ENCOUNTER — Other Ambulatory Visit: Payer: Self-pay | Admitting: Neurology

## 2023-08-22 DIAGNOSIS — G20A1 Parkinson's disease without dyskinesia, without mention of fluctuations: Secondary | ICD-10-CM

## 2023-08-23 MED ORDER — AMANTADINE HCL 100 MG PO CAPS
100.0000 mg | ORAL_CAPSULE | Freq: Two times a day (BID) | ORAL | 0 refills | Status: DC
Start: 2023-08-23 — End: 2023-11-24

## 2023-08-30 NOTE — Therapy (Signed)
[facility] [address] [place], [place], [address] Phone: [phone]   Fax:  [phone]  Patient Details  Name: Ian [name] MRN: [id number] Date of Birth: 28-May-1961 Referring Provider:  Tracey Harries, MD  Encounter Date: 08/31/2023  Occupational Therapy Parkinson's Disease Screen  Hand dominance:  Rt   Physical Performance Test item #2 (simulated eating):  11.16 sec  Physical Performance Test item #4 (donning/doffing jacket):  7 sec  Fastening/unfastening 3 buttons in:  14 sec  9-hole peg test:    RUE  26.80 sec        LUE  25.08 sec  Change in ability to perform ADLs/IADLs:  pt reports changes in "off" time has increased and more fatigue - encouraged pt to discuss with Dr. Arbutus Leas. Pt reports he sometimes goes too fast w/ things (writing, eating, brushing teeth, and noted speech but cues himself to slow down)   Other Comments:  No changes in handwriting, BUE AROM WFL's   Pt does not require occupational therapy services at this time.  Recommended occupational therapy screen in 6 months   Sheran Lawless, OT 08/30/2023, 9:39 AM  [facility] [address] [place], [place], [address] Phone: [phone]   Fax:  [phone]

## 2023-08-31 ENCOUNTER — Telehealth: Payer: Self-pay | Admitting: Speech Pathology

## 2023-08-31 ENCOUNTER — Ambulatory Visit: Payer: Medicare Other | Admitting: Physical Therapy

## 2023-08-31 ENCOUNTER — Ambulatory Visit: Payer: Medicare Other | Admitting: Speech Pathology

## 2023-08-31 ENCOUNTER — Ambulatory Visit: Payer: Medicare Other | Attending: Family Medicine | Admitting: Occupational Therapy

## 2023-08-31 ENCOUNTER — Other Ambulatory Visit: Payer: Self-pay | Admitting: Neurology

## 2023-08-31 DIAGNOSIS — R498 Other voice and resonance disorders: Secondary | ICD-10-CM

## 2023-08-31 DIAGNOSIS — R1310 Dysphagia, unspecified: Secondary | ICD-10-CM | POA: Insufficient documentation

## 2023-08-31 DIAGNOSIS — G20B2 Parkinson's disease with dyskinesia, with fluctuations: Secondary | ICD-10-CM

## 2023-08-31 DIAGNOSIS — R278 Other lack of coordination: Secondary | ICD-10-CM | POA: Insufficient documentation

## 2023-08-31 DIAGNOSIS — R471 Dysarthria and anarthria: Secondary | ICD-10-CM

## 2023-08-31 DIAGNOSIS — R29818 Other symptoms and signs involving the nervous system: Secondary | ICD-10-CM

## 2023-08-31 NOTE — Therapy (Signed)
[facility] [address] [place], [place], [address] Phone: [phone]   Fax:  [phone]  Patient Details  Name: Ian [name] MRN: [id number] Date of Birth: 1961/12/08 Referring Provider:  Tracey Harries, MD  Encounter Date: 08/31/2023  Physical Therapy Parkinson's Disease Screen   Timed Up and Go test: 6.5 seconds (previously 5.75 seconds)  10 meter walk test: 7 seconds = 4.68 ft/sec (7.75 seconds = 4.23 ft/sec)   5 time sit to stand test: 8.6 seconds (previously 9 seconds)  Doing good. Notes no changes in his balance. No falls. Doing PWR classes weekly, spin classes, Tai Chi, golf on Fridays, and personal training 2x a week.    Patient does not require Physical Therapy services at this time.  Recommend Physical Therapy screen in 6 months.    Drake Leach, PT, DPT 08/31/2023, 8:11 AM  [facility] [address] [place], [place], [address] Phone: [phone]   Fax:  [phone]

## 2023-08-31 NOTE — Therapy (Signed)
[facility] [address] [place], [place], [address] Phone: [phone]   Fax:  [phone]  Patient Details  Name: Esgardo Chatwin MRN: [id number] Date of Birth: 1961/09/07 Referring Provider:  Tracey Harries, MD  Encounter Date: 08/31/2023  Speech Therapy Parkinson's Disease Screen   Decibel Level today: low 70s dB  (WNL=70-72 dB) with sound level meter 30cm away from pt's mouth. Pt's conversational volume has maintained since last treatment course. Primary characteristic of pt's speech is increased rate which decreased intelligibility. Pt endorses this has worsened since last therapy course.    Pt does report difficulty with swallowing, which does warrant further evaluation. Recommend Modified Barium Swallow Study, ST will place order.   Pt reports challenges with word finding and organization related    Pt would benefit from speech-language eval for dysarthria, bedside swallow, and cognition- please order via EPIC or call [phone] to schedule  Maia Breslow, CCC-SLP 08/31/2023, 7:58 AM  [facility] [address] [place], [place], [address] Phone: [phone]   Fax:  [phone]

## 2023-08-31 NOTE — Telephone Encounter (Signed)
Good morning Dr. Arbutus Leas,  Ian [name] was screened by Speech Therapy on 08/31/2023.  The patient would benefit from speech therapy evaluation for Parkinson's disease due to decline in function for cognition, swallowing, and speech. I've also placed order request for MBSS for your signature to objectively assess pt's swallow function d/t challenges reported, c/b globus.  If you agree, please place an order to [facility] on [address] via EPIC or fax the order to [phone]. Thank you, Link Snuffer, [facility] [address] [place], [place]  [address] Phone:  [phone] Fax:  [phone]

## 2023-09-01 ENCOUNTER — Other Ambulatory Visit (HOSPITAL_COMMUNITY): Payer: Self-pay | Admitting: *Deleted

## 2023-09-01 DIAGNOSIS — R1310 Dysphagia, unspecified: Secondary | ICD-10-CM

## 2023-09-01 DIAGNOSIS — R059 Cough, unspecified: Secondary | ICD-10-CM

## 2023-09-04 ENCOUNTER — Other Ambulatory Visit: Payer: Self-pay | Admitting: Neurology

## 2023-09-04 DIAGNOSIS — G20A1 Parkinson's disease without dyskinesia, without mention of fluctuations: Secondary | ICD-10-CM

## 2023-09-05 ENCOUNTER — Encounter: Payer: Self-pay | Admitting: Neurology

## 2023-09-05 ENCOUNTER — Other Ambulatory Visit: Payer: Self-pay

## 2023-09-05 DIAGNOSIS — G20A1 Parkinson's disease without dyskinesia, without mention of fluctuations: Secondary | ICD-10-CM

## 2023-09-05 MED ORDER — RYTARY 23.75-95 MG PO CPCR
ORAL_CAPSULE | ORAL | 0 refills | Status: DC
Start: 2023-09-05 — End: 2023-12-05

## 2023-09-13 NOTE — Progress Notes (Unsigned)
  [name] [address] [place] [address] Phone: [phone] Subjective:   [name], am serving as a scribe for Dr. Antoine Primas.  I'm seeing this patient by the request  of:  Tracey Harries, MD  CC: Back and neck pain follow-up  [id number]  Heberto Ferenz is a 62 y.o. male coming in with complaint of back and neck pain. OMT 07/27/2023. Patient states   Medications patient has been prescribed: None  Taking:         Reviewed prior external information including notes and imaging from previsou exam, outside providers and external EMR if available.   As well as notes that were available from care everywhere and other healthcare systems.  Past medical history, social, surgical and family history all reviewed in electronic medical record.  No pertanent information unless stated regarding to the chief complaint.   Past Medical History:  Diagnosis Date   Anxiety    History of kidney stones    passed   PD (Parkinson's disease) 09/10/2013   PONV (postoperative nausea and vomiting)    nausea     No Known Allergies   Review of Systems:  No headache, visual changes, nausea, vomiting, diarrhea, constipation, dizziness, abdominal pain, skin rash, fevers, chills, night sweats, weight loss, swollen lymph nodes, body aches, joint swelling, chest pain, shortness of breath, mood changes. POSITIVE muscle aches  Objective  Blood pressure 112/76, pulse 82, height 6\' 3"  (1.905 m), weight 169 lb (76.7 kg), SpO2 96%.   General: No apparent distress alert and oriented x3 mood and affect normal, dressed appropriately.  HEENT: Pupils equal, extraocular movements intact  Respiratory: Patient's speak in full sentences and does not appear short of breath  Cardiovascular: No lower extremity edema, non tender, no erythema  Gait MSK:  Back does have some loss lordosis noted.  Some tightness with FABER test bilaterally.  Patient does have some  rigidity of the musculature but nothing severe.  Osteopathic findings  C3 flexed rotated and side bent right C7 flexed rotated and side bent left T3 extended rotated and side bent right inhaled rib T7 extended rotated and side bent left L2 flexed rotated and side bent right Sacrum right on right       Assessment and Plan:  Parkinson's disease with dyskinesia and fluctuating manifestations Sometimes this does cause some increase in tightness.  At the moment though patient seems to be doing relatively well.  Continuing to stay active, feels like this is needed significant help to go with patient continuing to be active.  Patient will continue to have responded well to osteopathic manipulation.  Follow-up with me again in 6 to 8 weeks otherwise    Nonallopathic problems  Decision today to treat with OMT was based on Physical Exam  After verbal consent patient was treated with HVLA, ME, FPR techniques in cervical, rib, thoracic, lumbar, and sacral  areas  Patient tolerated the procedure well with improvement in symptoms  Patient given exercises, stretches and lifestyle modifications  See medications in patient instructions if given  Patient will follow up in 4-8 weeks    The above documentation has been reviewed and is accurate and complete Judi Saa, DO          Note: This dictation was prepared with Dragon dictation along with smaller phrase technology. Any transcriptional errors that result from this process are unintentional.

## 2023-09-14 ENCOUNTER — Ambulatory Visit: Payer: Medicare Other | Admitting: Family Medicine

## 2023-09-14 ENCOUNTER — Other Ambulatory Visit: Payer: Self-pay

## 2023-09-14 ENCOUNTER — Encounter: Payer: Self-pay | Admitting: Family Medicine

## 2023-09-14 ENCOUNTER — Ambulatory Visit: Payer: Medicare Other

## 2023-09-14 VITALS — BP 112/76 | HR 82 | Ht 75.0 in | Wt 169.0 lb

## 2023-09-14 DIAGNOSIS — M9902 Segmental and somatic dysfunction of thoracic region: Secondary | ICD-10-CM | POA: Diagnosis not present

## 2023-09-14 DIAGNOSIS — R471 Dysarthria and anarthria: Secondary | ICD-10-CM | POA: Diagnosis present

## 2023-09-14 DIAGNOSIS — M9908 Segmental and somatic dysfunction of rib cage: Secondary | ICD-10-CM | POA: Diagnosis not present

## 2023-09-14 DIAGNOSIS — M9904 Segmental and somatic dysfunction of sacral region: Secondary | ICD-10-CM | POA: Diagnosis not present

## 2023-09-14 DIAGNOSIS — M9901 Segmental and somatic dysfunction of cervical region: Secondary | ICD-10-CM

## 2023-09-14 DIAGNOSIS — R29818 Other symptoms and signs involving the nervous system: Secondary | ICD-10-CM | POA: Diagnosis present

## 2023-09-14 DIAGNOSIS — G20B2 Parkinson's disease with dyskinesia, with fluctuations: Secondary | ICD-10-CM

## 2023-09-14 DIAGNOSIS — M9903 Segmental and somatic dysfunction of lumbar region: Secondary | ICD-10-CM | POA: Diagnosis not present

## 2023-09-14 DIAGNOSIS — R1310 Dysphagia, unspecified: Secondary | ICD-10-CM | POA: Diagnosis present

## 2023-09-14 DIAGNOSIS — R278 Other lack of coordination: Secondary | ICD-10-CM | POA: Diagnosis present

## 2023-09-14 NOTE — Patient Instructions (Signed)
   START WITH LESSON 4 in Speak Out  Here is the link for the online practice sessions: [email or web address] (Scroll down and it is the video link on the right)  You and Tonia watch the MIDDLE VIDEO on the [facility] homepage ("What is Parkinson's?")

## 2023-09-14 NOTE — Assessment & Plan Note (Signed)
Sometimes this does cause some increase in tightness.  At the moment though patient seems to be doing relatively well.  Continuing to stay active, feels like this is needed significant help to go with patient continuing to be active.  Patient will continue to have responded well to osteopathic manipulation.  Follow-up with me again in 6 to 8 weeks otherwise

## 2023-09-14 NOTE — Therapy (Signed)
OUTPATIENT SPEECH LANGUAGE PATHOLOGY PARKINSON'S EVALUATION   Patient Name: Ian [name] MRN: [id number] DOB:1961-03-15, 62 y.o., male Today's Date: 09/14/2023  PCP: Tracey Harries, MD REFERRING PROVIDER: Vladimir Faster, DO   End of Session - 09/14/23 1213     Visit Number 1    Number of Visits 17    Date for SLP Re-Evaluation 11/11/23    SLP Start Time 0850    SLP Stop Time  0932    SLP Time Calculation (min) 42 min    Activity Tolerance Patient tolerated treatment well             Past Medical History:  Diagnosis Date   Anxiety    History of kidney stones    passed   PD (Parkinson's disease) 09/10/2013   PONV (postoperative nausea and vomiting)    nausea    Past Surgical History:  Procedure Laterality Date   COLONOSCOPY     MINOR PLACEMENT OF FIDUCIAL N/A 01/31/2017   Procedure: MINOR PLACEMENT OF FIDUCIAL;  Surgeon: Maeola Harman, MD;  Location: [facility] OR;  Service: Neurosurgery;  Laterality: N/A;  Fiducial placement   none     PULSE GENERATOR IMPLANT Right 02/17/2017   Procedure: Right implantable pulse generator placement;  Surgeon: Maeola Harman, MD;  Location: [facility] OR;  Service: Neurosurgery;  Laterality: Right;  Bilateral implantable pulse generator placement   SUBTHALAMIC STIMULATOR BATTERY REPLACEMENT N/A 10/19/2018   Procedure: Deep Brain stimulator implantable pulse generator revision;  Surgeon: Maeola Harman, MD;  Location: [facility] OR;  Service: Neurosurgery;  Laterality: N/A;  Deep Brain stimulator implantable pulse generator revision   SUBTHALAMIC STIMULATOR INSERTION Bilateral 02/10/2017   Procedure: Bilateral Deep brain stimulator placement;  Surgeon: Maeola Harman, MD;  Location: [facility] OR;  Service: Neurosurgery;  Laterality: Bilateral;  Bilateral Deep brain stimulator placement   UPPER GASTROINTESTINAL ENDOSCOPY     Patient Active Problem List   Diagnosis Date Noted   Hip flexor tendon tightness 09/20/2022   Closed fracture of phalanx of left fifth toe 10/19/2021    Tinea versicolor 05/28/2021   Left rotator cuff tear 08/20/2020   Nonallopathic lesion of sacral region 07/25/2018   Nonallopathic lesion of lumbosacral region 07/25/2018   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   Acute bursitis of right shoulder 12/06/2017   Antalgic gait 01/24/2017   Intercostal muscle tear 07/19/2016   Parkinson's disease with dyskinesia and fluctuating manifestations 06/24/2016   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   Nonallopathic lesion of thoracic region 01/25/2016   Nonallopathic lesion-rib cage 01/25/2016   Nonallopathic lesion of cervical region 01/25/2016   Parkinson's disease 01/25/2016   Lateral epicondylitis 12/01/2014   REM behavioral disorder 08/27/2014   Paralysis agitans 10/18/2013    ONSET DATE: dx Parkinson's 2014, script 08/31/23  REFERRING DIAG: G20 (ICD-10-CM) - Parkinson's disease (HCC)  THERAPY DIAG:  Dysarthria and anarthria  Dysphagia, unspecified type  SUBJECTIVE:   SUBJECTIVE STATEMENT: Pt enters with very fast rate: "I haven't done them (M-words, /a/, glides) maybe once a week since I left speech."  Pt accompanied by: self  PERTINENT HISTORY: Dx with PD Fall 2014, symptoms began 2012. Primary concerns are lethargy, anxiety, fast rate of speech. Had speech screen 08/31/23 and ST was recommended (see "diagnostic findings" below), along with MBS. MBS scheduled 09/19/23.  PAIN:  Are you having pain? No  FALLS: Has patient fallen in last 6 months?  No  DIAGNOSTIC FINDINGS: Encounter Date: 08/31/2023 Speech Therapy Parkinson's Disease Screen       Decibel Level today: low 70s  dB  (WNL=70-72 dB) with sound level meter 30cm away from pt's mouth. Pt's conversational volume has maintained since last treatment course. Primary characteristic of pt's speech is increased rate which decreased intelligibility. Pt endorses this has worsened since last therapy course.   Pt does report difficulty with swallowing, which does warrant further  evaluation. Recommend Modified Barium Swallow Study, ST will place order.  Pt reports challenges with word finding and organization related   Pt would benefit from speech-language eval for dysarthria, bedside swallow, and cognition- please order via EPIC or call [phone] to schedule  LIVING ENVIRONMENT: Lives with: lives with their spouse Lives in: House/apartment  PLOF:  Level of assistance: Independent with IADLs Employment: Retired  PATIENT GOALS : "to get this speech more consistent"  OBJECTIVE:   COGNITION: Overall cognitive status: Within functional limits for tasks assessed Comments: Reports more difficulty with organizing thoughts leading to more challenging fluid verbal expression  MOTOR SPEECH: Overall motor speech: impaired Level of impairment: Sentence and Conversation Respiration: thoracic breathing Phonation: normal Resonance: WFL Articulation: Impaired: sentence and conversation Intelligibility: Intelligibility reduced 90% today for this trained listener; once asked for repeat, intelligibility improved to 100% Motor planning: Impaired: inconsistent Motor speech errors: aware and inconsistent Interfering components: premorbid status and DBS placement Effective technique: slow rate, increased vocal intensity, over articulate, and pacing Intelligibility was hindered (at 90%, as above) mostly by a variable rate of speech including frequent rushes of speech. Occasional initial phoneme and part-word repetitions were present during these rushes of speech, and rarely without rushes of speech. Given occasional min-mod A, pt reduced rate in 3 minutes simple conversation and improved his intelligibility to 100%.  ORAL MOTOR ASSESSMENT:   WFL   OBJECTIVE VOICE ASSESSMENT: Sustained "ah" maximum phonation time: 12 seconds Sustained "ah" loudness average: 90 dB Conversational loudness average: 71 dB Conversational loudness range: 67-74 dB Voice quality: normal   Comments: Pt's loudness was essentially WNL. Voice quality was WNL.  Completed audio recording of patients baseline voice without cueing from SLP: No  Pt does report difficulty with swallowing which does warrant further evaluation. His MBS is scheduled for Tuesday 09/19/23. Goals added PRN after that assessment.  PATIENT REPORTED OUTCOME MEASURES (PROM): Communication Participation Item Bank: provided in first 1-2 sessions   TODAY'S TREATMENT:  Speak out=SO 09/13/23 (eval): Evaluation results, recommendations for skilled ST. SLP re-trained pt on SO using Lesson 4, which pt had difficulty with using intentional speech on the reading portion (poem stanzas). SLP told pt to start with Lesson 4, and to go online and participate in online session on [email or web address]. He agreed. SLP also told pt homework was for he and Tonia to watch "what is Parkinson's" 60-minute webinar prior to next session.   PATIENT EDUCATION: Education details: see "today's treatment" above Person educated: Patient Education method: Explanation, Demonstration, Verbal cues, and Handouts Education comprehension: verbalized understanding, returned demonstration, verbal cues required, and needs further education   HOME EXERCISE PROGRAM: Speak Out, beginning with Lesson 4 and completing until Lesson 24 with one lesson/day.   GOALS: Goals reviewed with patient? No  SHORT TERM GOALS: Target date: 10/13/23    Pt will maintain WNL rate of speech during structured practice (oral reading, cognitive exercise, etc.), multi-sentence level, with occasional min-A over 2 sessions.  Baseline: Goal status: INITIAL  2.  Pt will self-ID rushes in speech, as they occur, in 50% of opportunities to increase ability to self-correct rate with occasional min-A over 2 sessions.  Baseline:  Goal status: INITIAL  3.  Pt will report completion of dysarthria HEP at least 1x/day over 1 week period with mod-I Baseline:  Goal status:  INITIAL  4. Pt will complete safe swallow strategies with modified independence in 2 sessions  Baseline:  Goal status: INITIAL  5. Pt will complete dysphagia HEP with modified independence in 2 sessions  Baseline:  Goal status: INITIAL  LONG TERM GOALS: Target date: 11/10/23    Pt will demonstrate average WNL rate of speech over 18+ minute conversational sample with no more than 4 rushes of speech over sample period, with rare min-A.  Baseline:  Goal status: INITIAL  2.  Pt will ID and self-correct rushes in rate of speech in 70% of occurences in structured speech practice (e.g. oral reading) over 2 sessions with rare min-A.  Baseline:  Goal status: INITIAL  3.  Pt will report improvement via CPIB PROM by 4 points by d/c, indicative subjective improvement in communicative abilities.  Baseline:  Goal status: INITIAL  ASSESSMENT:  CLINICAL IMPRESSION: Patient is a 62 y.o. M who was seen today for dysarthria in light of Parkinson's disease. Volume was WNL in mod complex conversation. Intelligibility was hindered (at 90%, as above) mostly by a variable rate of speech including frequent rushes of speech. Occasional initial phoneme and part-word repetitions were present during these rushes of speech, and rarely without rushes of speech. Given occasional min-mod A, pt reduced rate in 3 minutes simple conversation and improved his intelligibility to 100%. [name] tells SLP that he avoids some speaking situations at this time due to his speech.  OBJECTIVE IMPAIRMENTS  Objective impairments include dysarthria. These impairments are limiting patient from effectively communicating at home and in community.Factors affecting potential to achieve goals and functional outcome are medical prognosis.. Patient will benefit from skilled SLP services to address above impairments and improve overall function.  REHAB POTENTIAL: Good  PLAN: SLP FREQUENCY: 2x/week  SLP DURATION: 8 weeks  PLANNED  INTERVENTIONS: Aspiration precaution training, Pharyngeal strengthening exercises, Diet toleration management , Internal/external aids, SLP instruction and feedback, Compensatory strategies, and Patient/family education   [facility], CCC-SLP 09/14/2023, 12:40 PM

## 2023-09-19 ENCOUNTER — Ambulatory Visit (HOSPITAL_COMMUNITY)
Admission: RE | Admit: 2023-09-19 | Discharge: 2023-09-19 | Disposition: A | Discharge status: Home / Self Care | Payer: Medicare Other | Source: Ambulatory Visit | Attending: Neurology | Admitting: Neurology

## 2023-09-19 DIAGNOSIS — R059 Cough, unspecified: Secondary | ICD-10-CM

## 2023-09-19 DIAGNOSIS — R471 Dysarthria and anarthria: Secondary | ICD-10-CM

## 2023-09-19 DIAGNOSIS — R1310 Dysphagia, unspecified: Secondary | ICD-10-CM | POA: Diagnosis present

## 2023-09-19 DIAGNOSIS — G20A1 Parkinson's disease without dyskinesia, without mention of fluctuations: Secondary | ICD-10-CM | POA: Diagnosis not present

## 2023-09-19 NOTE — Progress Notes (Signed)
Modified Barium Swallow Study  Patient Details  Name: Ian [name] MRN: [id number] Date of Birth: [birth year]  Today's Date: 09/19/2023  Modified Barium Swallow completed.  Full report located under Chart Review in the Imaging Section.  History of Present Illness PHMx PD with DBS, followed by Dr. Arbutus Leas, currently being seen by OP ST to address dysarthria. Pt c/o occasional coughing with liquids and sensation of pills sticking. Symptoms have increased over last 3-4 mos per pt report.   Clinical Impression Pt presents with overall functional oropharyngeal swallow. Mild deficits observed with laryngeal elevation and delay observed with epiglottic inversion and laryngeal vestibule closure with thin liquids. Laryngeal vestibule observed to achieve full closure after swallow initiation, which allows barium to coat underside of epiglottis. No aspiration or penetration was observed across administered consistencies. Mild pharyngeal residue noted in valleculae and pyriform sinus which clears with cues dry swallow. SLP provided education on recommendations for use of intentional swallow, routine oral care, continuation of regular diet with thin liquids. Advised pt to monitor symptoms and let neurologist, PCP, or primary SLP know of worsening symptoms, as swallow deficits are common with PD. Pt verbalizes understanding, plans to f/u with treating SLP. Factors that may increase risk of adverse event in presence of aspiration Rubye Oaks & Clearance Coots 2021):  (none noted)  Swallow Evaluation Recommendations Recommendations: PO diet PO Diet Recommendation: Regular;Thin liquids (Level 0) Liquid Administration via: Cup;Straw Medication Administration: Whole meds with liquid Supervision: Patient able to self-feed Oral care recommendations: Oral care BID (2x/day) Recommended consults:  (none)    Maia Breslow 09/19/2023,1:23 PM

## 2023-09-20 ENCOUNTER — Ambulatory Visit: Payer: Medicare Other | Attending: Family Medicine

## 2023-09-20 DIAGNOSIS — R1310 Dysphagia, unspecified: Secondary | ICD-10-CM | POA: Diagnosis present

## 2023-09-20 DIAGNOSIS — R471 Dysarthria and anarthria: Secondary | ICD-10-CM | POA: Insufficient documentation

## 2023-09-20 NOTE — Therapy (Signed)
OUTPATIENT SPEECH LANGUAGE PATHOLOGY PARKINSON'S TREATMENT   Patient Name: Ian [name] MRN: [id number] DOB:1961/08/04, 62 y.o., male Today's Date: 09/20/2023  PCP: Tracey Harries, MD REFERRING PROVIDER: Vladimir Faster, DO   End of Session - 09/20/23 1250     Visit Number 2    Number of Visits 17    Date for SLP Re-Evaluation 11/11/23    SLP Start Time 1236    SLP Stop Time  1315    SLP Time Calculation (min) 39 min    Activity Tolerance Patient tolerated treatment well             Past Medical History:  Diagnosis Date   Anxiety    History of kidney stones    passed   PD (Parkinson's disease) (HCC) 09/10/2013   PONV (postoperative nausea and vomiting)    nausea    Past Surgical History:  Procedure Laterality Date   COLONOSCOPY     MINOR PLACEMENT OF FIDUCIAL N/A 01/31/2017   Procedure: MINOR PLACEMENT OF FIDUCIAL;  Surgeon: Maeola Harman, MD;  Location: [facility] OR;  Service: Neurosurgery;  Laterality: N/A;  Fiducial placement   none     PULSE GENERATOR IMPLANT Right 02/17/2017   Procedure: Right implantable pulse generator placement;  Surgeon: Maeola Harman, MD;  Location: [facility] OR;  Service: Neurosurgery;  Laterality: Right;  Bilateral implantable pulse generator placement   SUBTHALAMIC STIMULATOR BATTERY REPLACEMENT N/A 10/19/2018   Procedure: Deep Brain stimulator implantable pulse generator revision;  Surgeon: Maeola Harman, MD;  Location: [facility] OR;  Service: Neurosurgery;  Laterality: N/A;  Deep Brain stimulator implantable pulse generator revision   SUBTHALAMIC STIMULATOR INSERTION Bilateral 02/10/2017   Procedure: Bilateral Deep brain stimulator placement;  Surgeon: Maeola Harman, MD;  Location: [facility] OR;  Service: Neurosurgery;  Laterality: Bilateral;  Bilateral Deep brain stimulator placement   UPPER GASTROINTESTINAL ENDOSCOPY     Patient Active Problem List   Diagnosis Date Noted   Hip flexor tendon tightness 09/20/2022   Closed fracture of phalanx of left fifth toe 10/19/2021    Tinea versicolor 05/28/2021   Left rotator cuff tear 08/20/2020   Nonallopathic lesion of sacral region 07/25/2018   Nonallopathic lesion of lumbosacral region 07/25/2018   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   Acute bursitis of right shoulder 12/06/2017   Antalgic gait 01/24/2017   Intercostal muscle tear 07/19/2016   Parkinson's disease with dyskinesia and fluctuating manifestations (HCC) 06/24/2016   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  SUBJECTIVE:   SUBJECTIVE STATEMENT:  MBS completed yesterday. Pt enters, "This is what being 'off' is like for me. You get to see it today. I take my meds at 1:15-1:30 today."  Pt accompanied by: self  PERTINENT HISTORY: Dx with PD Fall 2014, symptoms began 2012. Primary concerns are lethargy, anxiety, fast rate of speech. Had speech screen 08/31/23 and ST was recommended (see "diagnostic findings" below), along with MBS. MBS scheduled 09/19/23.  PAIN:  Are you having pain? No  FALLS: Has patient fallen in last 6 months?  No  DIAGNOSTIC FINDINGS: Encounter Date: 08/31/2023 Speech Therapy Parkinson's Disease Screen  Decibel Level today: low 70s dB  (WNL=70-72 dB) with sound level meter 30cm away from pt's mouth. Pt's conversational volume has maintained since last treatment course. Primary characteristic of pt's speech is increased rate which decreased intelligibility. Pt endorses this has worsened since last therapy course.   Pt does report difficulty with swallowing, which does warrant  further evaluation. Recommend Modified Barium Swallow Study, ST will place order.  Pt reports challenges with word finding and organization related   Pt would benefit from speech-language eval for dysarthria, bedside swallow, and cognition- please order via EPIC or call [phone] to schedule  MBS 09/19/23 HPI/PMH: HPI: PHMx PD with DBS, followed by Dr. Arbutus Leas, currently being seen by OP ST to address dysarthria. Pt c/o occasional coughing with liquids and sensation of pills sticking. Symptoms have increased over last 3-4 mos per pt report. Clinical Impression: Clinical Impression: Pt presents with overall functional oropharyngeal swallow. Mild deficits observed with laryngeal elevation and delay observed with epiglottic inversion and laryngeal vestibule closure with thin liquids. Laryngeal vestibule observed to achieve full closure after swallow initiation, which allows barium to coat underside of epiglottis. No aspiration or penetration was observed across administered consistencies. Mild pharyngeal residue noted in valleculae and pyriform sinus which clears with cues dry swallow. SLP provided education on recommendations for use of intentional swallow, routine oral care, continuation of regular diet with thin liquids. Advised pt to monitor symptoms and let neurologist, PCP, or primary SLP know of worsening symptoms, as swallow deficits are common with PD. Pt verbalizes understanding, plans to f/u with treating SLP.   PATIENT GOALS : "to get this speech more consistent"  OBJECTIVE:    PATIENT REPORTED OUTCOME MEASURES (PROM): Communication Participation Item Bank: provided in first 1-2 sessions   TODAY'S TREATMENT:  Speak out=SO 09/20/23: Next session SLP will need to teach pt Clair Gulling as a prophylactic swallowing exercise.  Today pt entered stating he is only doing Chief Strategy Officer. SLP reiterated that pt is to complete SO lesson/day and do an online session. He and wife watched  introductory webinar again and [name] said, "It was good to see it again."  Target improving vocal quality and increasing intensity through progressively difficulty speech tasks using Speak Out! program, lesson 2. ST lead pt through exercises providing occasional model prior to pt execution for glides. rare min-A required to achieve target dB this date. Averages this date:  Sustained "ah" 95 dB; SLP suggested pt reduce volume and pt did so with 90dB average reading 85dB average and occasional min-mod cues for intent to reduce speech rate Conversational sample of approx 5 minutes, pt averages 70 dB with occasional mod-A for deliberate speech production for speech rate reduction.  09/13/23 (eval): Evaluation results, recommendations for skilled ST. SLP re-trained pt on SO using Lesson 4, which pt had difficulty with using intentional speech on the reading portion (poem stanzas). SLP told pt to start with Lesson 4, and to go online and participate in online session on [email or web address]. He agreed. SLP also told pt homework was for he and Tonia to watch "what is Parkinson's" 60-minute webinar prior to next session.   PATIENT EDUCATION: Education details: see "today's treatment" above Person educated: Patient Education method: Explanation, Demonstration, Verbal cues, and Handouts Education comprehension: verbalized understanding, returned demonstration, verbal cues required, and needs further education   HOME EXERCISE PROGRAM: Speak Out, beginning with Lesson 4 and completing until Lesson 24 with one lesson/day.   GOALS: Goals reviewed with patient? No  SHORT TERM GOALS:  Target date: 10/13/23    Pt will maintain WNL rate of speech during structured practice (oral reading, cognitive exercise, etc.), multi-sentence level, with occasional min-A over 2 sessions.  Baseline: Goal status: INITIAL  2.  Pt will self-ID rushes in speech, as they occur, in 50% of opportunities to increase ability  to self-correct rate with occasional min-A over 2 sessions.  Baseline:  Goal status: INITIAL  3.  Pt will report completion of dysarthria HEP at least 1x/day over 1 week period with mod-I Baseline:  Goal status: INITIAL  4. Pt will complete safe swallow strategies with modified independence in 2 sessions  Baseline:  Goal status: INITIAL  5. Pt will complete dysphagia HEP with modified independence in 2 sessions  Baseline:  Goal status: INITIAL  LONG TERM GOALS: Target date: 11/10/23    Pt will demonstrate average WNL rate of speech over 18+ minute conversational sample with no more than 4 rushes of speech over sample period, with rare min-A.  Baseline:  Goal status: INITIAL  2.  Pt will ID and self-correct rushes in rate of speech in 70% of occurences in structured speech practice (e.g. oral reading) over 2 sessions with rare min-A.  Baseline:  Goal status: INITIAL  3.  Pt will report improvement via CPIB PROM by 4 points by d/c, indicative subjective improvement in communicative abilities.  Baseline:  Goal status: INITIAL  ASSESSMENT:  CLINICAL IMPRESSION: Patient is a 62 y.o. M who was seen today for dysarthria in light of Parkinson's disease. Usual initial phoneme and part-word repetitions were present during these rushes of speech, and rarely without rushes of speech. [name] told SLP at eval that he avoids some speaking situations at this time due to his speech.  OBJECTIVE IMPAIRMENTS  Objective impairments include dysarthria. These impairments are limiting patient from effectively communicating at home and in community.Factors affecting potential to achieve goals and functional outcome are medical prognosis.. Patient will benefit from skilled SLP services to address above impairments and improve overall function.  REHAB POTENTIAL: Good  PLAN: SLP FREQUENCY: 2x/week  SLP DURATION: 8 weeks  PLANNED INTERVENTIONS: Aspiration precaution training, Pharyngeal strengthening  exercises, Diet toleration management , Internal/external aids, SLP instruction and feedback, Compensatory strategies, and Patient/family education   [facility], CCC-SLP 09/20/2023, 12:50 PM

## 2023-09-26 ENCOUNTER — Ambulatory Visit: Payer: Medicare Other

## 2023-09-26 DIAGNOSIS — R1310 Dysphagia, unspecified: Secondary | ICD-10-CM

## 2023-09-26 DIAGNOSIS — R471 Dysarthria and anarthria: Secondary | ICD-10-CM

## 2023-09-26 NOTE — Therapy (Signed)
OUTPATIENT SPEECH LANGUAGE PATHOLOGY PARKINSON'S TREATMENT   Patient Name: Ian [name] MRN: [id number] DOB:[birth year], 62 y.o., male Today's Date: 09/26/2023  PCP: Tracey Harries, MD REFERRING PROVIDER: Vladimir Faster, DO   End of Session - 09/26/23 0942     Visit Number 3    Number of Visits 17    Date for SLP Re-Evaluation 11/11/23    SLP Start Time 0935    SLP Stop Time  1015    SLP Time Calculation (min) 40 min    Activity Tolerance Patient tolerated treatment well             Past Medical History:  Diagnosis Date   Anxiety    History of kidney stones    passed   PD (Parkinson's disease) (HCC) 09/10/2013   PONV (postoperative nausea and vomiting)    nausea    Past Surgical History:  Procedure Laterality Date   COLONOSCOPY     MINOR PLACEMENT OF FIDUCIAL N/A 01/31/2017   Procedure: MINOR PLACEMENT OF FIDUCIAL;  Surgeon: Maeola Harman, MD;  Location: [facility] OR;  Service: Neurosurgery;  Laterality: N/A;  Fiducial placement   none     PULSE GENERATOR IMPLANT Right 02/17/2017   Procedure: Right implantable pulse generator placement;  Surgeon: Maeola Harman, MD;  Location: [facility] OR;  Service: Neurosurgery;  Laterality: Right;  Bilateral implantable pulse generator placement   SUBTHALAMIC STIMULATOR BATTERY REPLACEMENT N/A 10/19/2018   Procedure: Deep Brain stimulator implantable pulse generator revision;  Surgeon: Maeola Harman, MD;  Location: [facility] OR;  Service: Neurosurgery;  Laterality: N/A;  Deep Brain stimulator implantable pulse generator revision   SUBTHALAMIC STIMULATOR INSERTION Bilateral 02/10/2017   Procedure: Bilateral Deep brain stimulator placement;  Surgeon: Maeola Harman, MD;  Location: [facility] OR;  Service: Neurosurgery;  Laterality: Bilateral;  Bilateral Deep brain stimulator placement   UPPER GASTROINTESTINAL ENDOSCOPY     Patient Active Problem List   Diagnosis Date Noted   Hip flexor tendon tightness 09/20/2022   Closed fracture of phalanx of left fifth toe 10/19/2021    Tinea versicolor 05/28/2021   Left rotator cuff tear 08/20/2020   Nonallopathic lesion of sacral region 07/25/2018   Nonallopathic lesion of lumbosacral region 07/25/2018   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   Acute bursitis of right shoulder 12/06/2017   Antalgic gait 01/24/2017   Intercostal muscle tear 07/19/2016   Parkinson's disease with dyskinesia and fluctuating manifestations (HCC) 06/24/2016   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 Pt enters, "This is what being 'off' is like for me. You get to see it today. I take my meds at 1:15-1:30 today."  Pt accompanied by: self  PERTINENT HISTORY: Dx with PD Fall 2014, symptoms began 2012. Primary concerns are lethargy, anxiety, fast rate of speech. Had speech screen 08/31/23 and ST was recommended (see "diagnostic findings" below), along with MBS. MBS scheduled 09/19/23.  PAIN:  Are you having pain? No  FALLS: Has patient fallen in last 6 months?  No  DIAGNOSTIC FINDINGS: Encounter Date: 08/31/2023 Speech Therapy Parkinson's Disease Screen  Decibel Level today: low 70s dB  (WNL=70-72 dB) with sound level meter 30cm away from pt's mouth. Pt's conversational volume has maintained since last treatment course. Primary characteristic of pt's speech is increased rate which decreased intelligibility. Pt endorses this has worsened since last therapy course.   Pt does report difficulty with  swallowing, which does warrant further evaluation. Recommend Modified Barium Swallow Study, ST will place order.  Pt reports challenges with word finding and organization related   Pt would benefit from speech-language eval for dysarthria, bedside swallow, and cognition- please order via EPIC or call [phone] to schedule  MBS 09/19/23 HPI/PMH: HPI: PHMx PD with DBS, followed by Dr. Arbutus Leas, currently being seen by OP ST to address dysarthria. Pt c/o occasional coughing with liquids and sensation of pills sticking. Symptoms have increased over last 3-4 mos per pt report. Clinical Impression: Clinical Impression: Pt presents with overall functional oropharyngeal swallow. Mild deficits observed with laryngeal elevation and delay observed with epiglottic inversion and laryngeal vestibule closure with thin liquids. Laryngeal vestibule observed to achieve full closure after swallow initiation, which allows barium to coat underside of epiglottis. No aspiration or penetration was observed across administered consistencies. Mild pharyngeal residue noted in valleculae and pyriform sinus which clears with cues dry swallow. SLP provided education on recommendations for use of intentional swallow, routine oral care, continuation of regular diet with thin liquids. Advised pt to monitor symptoms and let neurologist, PCP, or primary SLP know of worsening symptoms, as swallow deficits are common with PD. Pt verbalizes understanding, plans to f/u with treating SLP.   PATIENT GOALS : "to get this speech more consistent"  OBJECTIVE:    PATIENT REPORTED OUTCOME MEASURES (PROM): Communication Participation Item Bank: provided in first 1-2 sessions   TODAY'S TREATMENT:  Speak out=SO 09/26/23: [name] will need CPIB next session. Swallow: SLP taught pt about Mendelsohn. Pt performed x7 reps with fading cues. By 7th rep pt was counting his own 5-second hold appropriately. SLP and pt discussed his swallowing needs to be  purposeful, based on the results of his MBS, to be more normalized (timing of vestibule closure is late - SLP postulates that pt is rushing the bolus into the pharynx.The plan is for pt to be more deliberate in his swallowing.  Speech: Pt is only on lesson 10 in SO book. SLP strongly reiterated consistency is the key to lasting change with his speech. SLP educated [name] today while practicing with SO practice online. SLP provided min A occasionally for slowing rate and exhibiting deliberate speech  09/20/23: Next session SLP will need to teach pt Clair Gulling as a prophylactic swallowing exercise.  Today pt entered stating he is only doing Chief Strategy Officer. SLP reiterated that pt is to complete SO lesson/day and do an online session. He and wife watched introductory webinar again and [name] said, "It was good to see it again."  Target improving vocal quality and increasing intensity through progressively difficulty speech tasks using Speak Out! program, lesson 2. ST lead pt through exercises providing occasional model prior to pt execution for glides. rare min-A required to achieve target dB this date. Averages this date:  Sustained "ah" 95 dB; SLP suggested pt reduce volume and pt did so with 90dB average reading 85dB average and occasional min-mod cues for intent to reduce speech rate Conversational sample of approx 5 minutes, pt averages 70 dB with occasional mod-A for deliberate speech production for speech rate reduction.  09/13/23 (eval): Evaluation results, recommendations for skilled ST. SLP  re-trained pt on SO using Lesson 4, which pt had difficulty with using intentional speech on the reading portion (poem stanzas). SLP told pt to start with Lesson 4, and to go online and participate in online session on [email or web address]. He agreed. SLP also told pt homework was for he and Tonia to watch "what is Parkinson's" 60-minute webinar prior to next session.   PATIENT EDUCATION: Education  details: see "today's treatment" above Person educated: Patient Education method: Explanation, Demonstration, Verbal cues, and Handouts Education comprehension: verbalized understanding, returned demonstration, verbal cues required, and needs further education   HOME EXERCISE PROGRAM: Speak Out, beginning with Lesson 4 and completing until Lesson 24 with one lesson/day.   GOALS: Goals reviewed with patient? No  SHORT TERM GOALS: Target date: 10/13/23    Pt will maintain WNL rate of speech during structured practice (oral reading, cognitive exercise, etc.), multi-sentence level, with occasional min-A over 2 sessions.  Baseline: Goal status: INITIAL  2.  Pt will self-ID rushes in speech, as they occur, in 50% of opportunities to increase ability to self-correct rate with occasional min-A over 2 sessions.  Baseline:  Goal status: INITIAL  3.  Pt will report completion of dysarthria HEP at least 1x/day over 1 week period with mod-I Baseline:  Goal status: INITIAL  4. Pt will complete safe swallow strategies with modified independence in 2 sessions  Baseline:  Goal status: INITIAL  5. Pt will complete dysphagia HEP with modified independence in 2 sessions  Baseline:  Goal status: INITIAL  LONG TERM GOALS: Target date: 11/10/23    Pt will demonstrate average WNL rate of speech over 18+ minute conversational sample with no more than 4 rushes of speech over sample period, with rare min-A.  Baseline:  Goal status: INITIAL  2.  Pt will ID and self-correct rushes in rate of speech in 70% of occurences in structured speech practice (e.g. oral reading) over 2 sessions with rare min-A.  Baseline:  Goal status: INITIAL  3.  Pt will report improvement via CPIB PROM by 4 points by d/c, indicative subjective improvement in communicative abilities.  Baseline:  Goal status: INITIAL  ASSESSMENT:  CLINICAL IMPRESSION: Patient is a 62 y.o. M who was seen today for dysarthria in light of  Parkinson's disease. SLP educated pt about SO online and how to practice with this resource. Additionally, [name] was taught Mendelson and was independent by session end. [name] told SLP at eval that he avoids some speaking situations at this time due to his speech.  OBJECTIVE IMPAIRMENTS  Objective impairments include dysarthria. These impairments are limiting patient from effectively communicating at home and in community.Factors affecting potential to achieve goals and functional outcome are medical prognosis.. Patient will benefit from skilled SLP services to address above impairments and improve overall function.  REHAB POTENTIAL: Good  PLAN: SLP FREQUENCY: 2x/week  SLP DURATION: 8 weeks  PLANNED INTERVENTIONS: Aspiration precaution training, Pharyngeal strengthening exercises, Diet toleration management , Internal/external aids, SLP instruction and feedback, Compensatory strategies, and Patient/family education   [facility], CCC-SLP 09/26/2023, 9:45 AM

## 2023-09-28 ENCOUNTER — Ambulatory Visit: Payer: Medicare Other

## 2023-09-28 DIAGNOSIS — R471 Dysarthria and anarthria: Secondary | ICD-10-CM

## 2023-09-28 DIAGNOSIS — R1310 Dysphagia, unspecified: Secondary | ICD-10-CM

## 2023-09-28 NOTE — Therapy (Signed)
OUTPATIENT SPEECH LANGUAGE PATHOLOGY PARKINSON'S TREATMENT   Patient Name: Ian [name] MRN: [id number] DOB:Mar 28, 1961, 62 y.o., male Today's Date: 09/28/2023  PCP: Tracey Harries, MD REFERRING PROVIDER: Vladimir Faster, DO   End of Session - 09/28/23 1140     Visit Number 4    Number of Visits 17    Date for SLP Re-Evaluation 11/11/23    SLP Start Time 0936    SLP Stop Time  1015    SLP Time Calculation (min) 39 min    Activity Tolerance Patient tolerated treatment well              Past Medical History:  Diagnosis Date   Anxiety    History of kidney stones    passed   PD (Parkinson's disease) (HCC) 09/10/2013   PONV (postoperative nausea and vomiting)    nausea    Past Surgical History:  Procedure Laterality Date   COLONOSCOPY     MINOR PLACEMENT OF FIDUCIAL N/A 01/31/2017   Procedure: MINOR PLACEMENT OF FIDUCIAL;  Surgeon: Maeola Harman, MD;  Location: [facility] OR;  Service: Neurosurgery;  Laterality: N/A;  Fiducial placement   none     PULSE GENERATOR IMPLANT Right 02/17/2017   Procedure: Right implantable pulse generator placement;  Surgeon: Maeola Harman, MD;  Location: [facility] OR;  Service: Neurosurgery;  Laterality: Right;  Bilateral implantable pulse generator placement   SUBTHALAMIC STIMULATOR BATTERY REPLACEMENT N/A 10/19/2018   Procedure: Deep Brain stimulator implantable pulse generator revision;  Surgeon: Maeola Harman, MD;  Location: [facility] OR;  Service: Neurosurgery;  Laterality: N/A;  Deep Brain stimulator implantable pulse generator revision   SUBTHALAMIC STIMULATOR INSERTION Bilateral 02/10/2017   Procedure: Bilateral Deep brain stimulator placement;  Surgeon: Maeola Harman, MD;  Location: [facility] OR;  Service: Neurosurgery;  Laterality: Bilateral;  Bilateral Deep brain stimulator placement   UPPER GASTROINTESTINAL ENDOSCOPY     Patient Active Problem List   Diagnosis Date Noted   Hip flexor tendon tightness 09/20/2022   Closed fracture of phalanx of left fifth toe  10/19/2021   Tinea versicolor 05/28/2021   Left rotator cuff tear 08/20/2020   Nonallopathic lesion of sacral region 07/25/2018   Nonallopathic lesion of lumbosacral region 07/25/2018   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   Acute bursitis of right shoulder 12/06/2017   Antalgic gait 01/24/2017   Intercostal muscle tear 07/19/2016   Parkinson's disease with dyskinesia and fluctuating manifestations (HCC) 06/24/2016   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   Nonallopathic lesion of thoracic region 01/25/2016   Nonallopathic lesion-rib cage 01/25/2016   Nonallopathic lesion of cervical region 01/25/2016   Parkinson's disease (HCC) 01/25/2016   Lateral epicondylitis 12/01/2014   REM behavioral disorder 08/27/2014   Paralysis agitans (HCC) 10/18/2013    ONSET DATE: dx Parkinson's 2014, script 08/31/23  REFERRING DIAG: G20 (ICD-10-CM) - Parkinson's disease (HCC)  THERAPY DIAG:  Dysarthria and anarthria  Dysphagia, unspecified type  SUBJECTIVE:   SUBJECTIVE STATEMENT:  "I didn't do it much yesterday, being in [place]."  Pt accompanied by: self  PERTINENT HISTORY: Dx with PD Fall 2014, symptoms began 2012. Primary concerns are lethargy, anxiety, fast rate of speech. Had speech screen 08/31/23 and ST was recommended (see "diagnostic findings" below), along with MBS. MBS scheduled 09/19/23.  PAIN:  Are you having pain? No  FALLS: Has patient fallen in last 6 months?  No  DIAGNOSTIC FINDINGS: Encounter Date: 08/31/2023 Speech Therapy Parkinson's Disease Screen       Decibel Level today: low 70s dB  (WNL=70-72 dB) with sound  level meter 30cm away from pt's mouth. Pt's conversational volume has maintained since last treatment course. Primary characteristic of pt's speech is increased rate which decreased intelligibility. Pt endorses this has worsened since last therapy course.   Pt does report difficulty with swallowing, which does warrant further evaluation. Recommend Modified  Barium Swallow Study, ST will place order.  Pt reports challenges with word finding and organization related   Pt would benefit from speech-language eval for dysarthria, bedside swallow, and cognition- please order via EPIC or call [phone] to schedule  MBS 09/19/23 HPI/PMH: HPI: PHMx PD with DBS, followed by Dr. Arbutus Leas, currently being seen by OP ST to address dysarthria. Pt c/o occasional coughing with liquids and sensation of pills sticking. Symptoms have increased over last 3-4 mos per pt report. Clinical Impression: Clinical Impression: Pt presents with overall functional oropharyngeal swallow. Mild deficits observed with laryngeal elevation and delay observed with epiglottic inversion and laryngeal vestibule closure with thin liquids. Laryngeal vestibule observed to achieve full closure after swallow initiation, which allows barium to coat underside of epiglottis. No aspiration or penetration was observed across administered consistencies. Mild pharyngeal residue noted in valleculae and pyriform sinus which clears with cues dry swallow. SLP provided education on recommendations for use of intentional swallow, routine oral care, continuation of regular diet with thin liquids. Advised pt to monitor symptoms and let neurologist, PCP, or primary SLP know of worsening symptoms, as swallow deficits are common with PD. Pt verbalizes understanding, plans to f/u with treating SLP.   PATIENT GOALS : "to get this speech more consistent"  OBJECTIVE:    PATIENT REPORTED OUTCOME MEASURES (PROM): Communication Participation Item Bank: (CPIB) provided 09/28/23. Pt scored himself 6/40, with greatest difficulty as in the picture below:    TODAY'S TREATMENT:  Speak out=SO 09/28/23: Pt provided CPIB today. See above for scoring details.  Today SLP assisted pt with SO to target improving vocal quality and increasing intensity through progressively difficulty speech tasks using Speak Out! Program lesson 13. ST  leads pt through exercises providing singular model prior to pt execution with occasional mod-A required to achieve target dB this date. Averages this date:  Sustained "ah" 90 dB reading 87dB cognitive speech task 73 dB.  Conversational sample of approx 2 minutes, pt averages rushes of speech with rare mod-A for deliberate speech.   10/8/24[name] will need CPIB next session. Swallow: SLP taught pt about Mendelsohn. Pt performed x7 reps with fading cues. By 7th rep pt was counting his own 5-second hold appropriately. SLP and pt discussed his swallowing needs to be purposeful, based on the results of his MBS, to be more normalized (timing of vestibule closure is late - SLP postulates that pt is rushing the bolus into the pharynx.The plan is for pt to be more deliberate in his swallowing.  Speech: Pt is only on lesson 10 in SO book. SLP strongly reiterated consistency is the key to lasting change with his speech. SLP educated [name] today while practicing with SO practice online. SLP provided min A occasionally for slowing rate and exhibiting deliberate speech  09/20/23: Next session SLP will need to teach pt Clair Gulling as a prophylactic swallowing exercise.  Today pt entered stating he is only doing Chief Strategy Officer. SLP reiterated that pt is to complete SO lesson/day and do an online session. He and wife watched introductory webinar again and [name] said, "It was good to see it again."  Target improving vocal quality and increasing intensity through progressively difficulty speech tasks  using Speak Out! program, lesson 2. ST lead pt through exercises providing occasional model prior to pt execution for glides. rare min-A required to achieve target dB this date. Averages this date:  Sustained "ah" 95 dB; SLP suggested pt reduce volume and pt did so with 90dB average reading 85dB average and occasional min-mod cues for intent to reduce speech rate Conversational sample of approx 5 minutes, pt averages  70 dB with occasional mod-A for deliberate speech production for speech rate reduction.  09/13/23 (eval): Evaluation results, recommendations for skilled ST. SLP re-trained pt on SO using Lesson 4, which pt had difficulty with using intentional speech on the reading portion (poem stanzas). SLP told pt to start with Lesson 4, and to go online and participate in online session on [email or web address]. He agreed. SLP also told pt homework was for he and Tonia to watch "what is Parkinson's" 60-minute webinar prior to next session.   PATIENT EDUCATION: Education details: see "today's treatment" above Person educated: Patient Education method: Explanation, Demonstration, Verbal cues, and Handouts Education comprehension: verbalized understanding, returned demonstration, verbal cues required, and needs further education   HOME EXERCISE PROGRAM: Speak Out, beginning with Lesson 4 and completing until Lesson 24 with one lesson/day.   GOALS: Goals reviewed with patient? No  SHORT TERM GOALS: Target date: 10/13/23    Pt will maintain WNL rate of speech during structured practice (oral reading, cognitive exercise, etc.), multi-sentence level, with occasional min-A over 2 sessions.  Baseline: Goal status: INITIAL  2.  Pt will self-ID rushes in speech, as they occur, in 50% of opportunities to increase ability to self-correct rate with occasional min-A over 2 sessions.  Baseline:  Goal status: INITIAL  3.  Pt will report completion of dysarthria HEP at least 1x/day over 1 week period with mod-I Baseline:  Goal status: INITIAL  4. Pt will complete safe swallow strategies with modified independence in 2 sessions  Baseline:  Goal status: INITIAL  5. Pt will complete dysphagia HEP with modified independence in 2 sessions  Baseline:  Goal status: INITIAL  LONG TERM GOALS: Target date: 11/10/23    Pt will demonstrate average WNL rate of speech over 18+ minute conversational sample with no  more than 4 rushes of speech over sample period, with rare min-A.  Baseline:  Goal status: INITIAL  2.  Pt will ID and self-correct rushes in rate of speech in 70% of occurences in structured speech practice (e.g. oral reading) over 2 sessions with rare min-A.  Baseline:  Goal status: INITIAL  3.  Pt will report improvement via CPIB PROM by 4 points by d/c, indicative subjective improvement in communicative abilities.  Baseline:  Goal status: INITIAL  ASSESSMENT:  CLINICAL IMPRESSION: Patient is a 62 y.o. M who was seen today for dysarthria in light of Parkinson's disease. SLP educated pt about SO online and how to practice with this resource. [name] told SLP at eval that he avoids some speaking situations at this time due to his speech.  OBJECTIVE IMPAIRMENTS  Objective impairments include dysarthria. These impairments are limiting patient from effectively communicating at home and in community.Factors affecting potential to achieve goals and functional outcome are medical prognosis.. Patient will benefit from skilled SLP services to address above impairments and improve overall function.  REHAB POTENTIAL: Good  PLAN: SLP FREQUENCY: 2x/week  SLP DURATION: 8 weeks  PLANNED INTERVENTIONS: Aspiration precaution training, Pharyngeal strengthening exercises, Diet toleration management , Internal/external aids, SLP instruction and feedback, Compensatory strategies, and Patient/family education  Kaneesha Constantino, CCC-SLP 09/28/2023, 11:41 AM

## 2023-10-03 ENCOUNTER — Ambulatory Visit: Payer: Medicare Other

## 2023-10-03 DIAGNOSIS — R471 Dysarthria and anarthria: Secondary | ICD-10-CM | POA: Diagnosis not present

## 2023-10-03 DIAGNOSIS — R1310 Dysphagia, unspecified: Secondary | ICD-10-CM

## 2023-10-03 NOTE — Therapy (Signed)
OUTPATIENT SPEECH LANGUAGE PATHOLOGY PARKINSON'S TREATMENT   Patient Name: Ian [name] MRN: [id number] DOB:10/26/1961, 62 y.o., male Today's Date: 10/03/2023  PCP: Tracey Harries, MD REFERRING PROVIDER: Vladimir Faster, DO   End of Session - 10/03/23 0939     Visit Number 5    Number of Visits 17    Date for SLP Re-Evaluation 11/11/23    SLP Start Time 0936    SLP Stop Time  1015    SLP Time Calculation (min) 39 min    Activity Tolerance Patient tolerated treatment well              Past Medical History:  Diagnosis Date   Anxiety    History of kidney stones    passed   PD (Parkinson's disease) (HCC) 09/10/2013   PONV (postoperative nausea and vomiting)    nausea    Past Surgical History:  Procedure Laterality Date   COLONOSCOPY     MINOR PLACEMENT OF FIDUCIAL N/A 01/31/2017   Procedure: MINOR PLACEMENT OF FIDUCIAL;  Surgeon: Maeola Harman, MD;  Location: [facility] OR;  Service: Neurosurgery;  Laterality: N/A;  Fiducial placement   none     PULSE GENERATOR IMPLANT Right 02/17/2017   Procedure: Right implantable pulse generator placement;  Surgeon: Maeola Harman, MD;  Location: [facility] OR;  Service: Neurosurgery;  Laterality: Right;  Bilateral implantable pulse generator placement   SUBTHALAMIC STIMULATOR BATTERY REPLACEMENT N/A 10/19/2018   Procedure: Deep Brain stimulator implantable pulse generator revision;  Surgeon: Maeola Harman, MD;  Location: [facility] OR;  Service: Neurosurgery;  Laterality: N/A;  Deep Brain stimulator implantable pulse generator revision   SUBTHALAMIC STIMULATOR INSERTION Bilateral 02/10/2017   Procedure: Bilateral Deep brain stimulator placement;  Surgeon: Maeola Harman, MD;  Location: [facility] OR;  Service: Neurosurgery;  Laterality: Bilateral;  Bilateral Deep brain stimulator placement   UPPER GASTROINTESTINAL ENDOSCOPY     Patient Active Problem List   Diagnosis Date Noted   Hip flexor tendon tightness 09/20/2022   Closed fracture of phalanx of left fifth toe  10/19/2021   Tinea versicolor 05/28/2021   Left rotator cuff tear 08/20/2020   Nonallopathic lesion of sacral region 07/25/2018   Nonallopathic lesion of lumbosacral region 07/25/2018   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   Acute bursitis of right shoulder 12/06/2017   Antalgic gait 01/24/2017   Intercostal muscle tear 07/19/2016   Parkinson's disease with dyskinesia and fluctuating manifestations (HCC) 06/24/2016   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   Nonallopathic lesion of thoracic region 01/25/2016   Nonallopathic lesion-rib cage 01/25/2016   Nonallopathic lesion of cervical region 01/25/2016   Parkinson's disease (HCC) 01/25/2016   Lateral epicondylitis 12/01/2014   REM behavioral disorder 08/27/2014   Paralysis agitans (HCC) 10/18/2013    ONSET DATE: dx Parkinson's 2014, script 08/31/23  REFERRING DIAG: G20 (ICD-10-CM) - Parkinson's disease (HCC)  THERAPY DIAG:  Dysarthria and anarthria  Dysphagia, unspecified type  SUBJECTIVE:   SUBJECTIVE STATEMENT:  "I am doing more (homework) in the car since last session."  Pt accompanied by: self  PERTINENT HISTORY: Dx with PD Fall 2014, symptoms began 2012. Primary concerns are lethargy, anxiety, fast rate of speech. Had speech screen 08/31/23 and ST was recommended (see "diagnostic findings" below), along with MBS. MBS scheduled 09/19/23.  PAIN:  Are you having pain? No  FALLS: Has patient fallen in last 6 months?  No  DIAGNOSTIC FINDINGS: Encounter Date: 08/31/2023 Speech Therapy Parkinson's Disease Screen       Decibel Level today: low 70s dB  (WNL=70-72 dB)  with sound level meter 30cm away from pt's mouth. Pt's conversational volume has maintained since last treatment course. Primary characteristic of pt's speech is increased rate which decreased intelligibility. Pt endorses this has worsened since last therapy course.   Pt does report difficulty with swallowing, which does warrant further evaluation. Recommend  Modified Barium Swallow Study, ST will place order.  Pt reports challenges with word finding and organization related   Pt would benefit from speech-language eval for dysarthria, bedside swallow, and cognition- please order via EPIC or call [phone] to schedule  MBS 09/19/23 HPI/PMH: HPI: PHMx PD with DBS, followed by Dr. Arbutus Leas, currently being seen by OP ST to address dysarthria. Pt c/o occasional coughing with liquids and sensation of pills sticking. Symptoms have increased over last 3-4 mos per pt report. Clinical Impression: Clinical Impression: Pt presents with overall functional oropharyngeal swallow. Mild deficits observed with laryngeal elevation and delay observed with epiglottic inversion and laryngeal vestibule closure with thin liquids. Laryngeal vestibule observed to achieve full closure after swallow initiation, which allows barium to coat underside of epiglottis. No aspiration or penetration was observed across administered consistencies. Mild pharyngeal residue noted in valleculae and pyriform sinus which clears with cues dry swallow. SLP provided education on recommendations for use of intentional swallow, routine oral care, continuation of regular diet with thin liquids. Advised pt to monitor symptoms and let neurologist, PCP, or primary SLP know of worsening symptoms, as swallow deficits are common with PD. Pt verbalizes understanding, plans to f/u with treating SLP.   PATIENT GOALS : "to get this speech more consistent"  OBJECTIVE:    PATIENT REPORTED OUTCOME MEASURES (PROM): Communication Participation Item Bank: (CPIB) provided 09/28/23. Pt scored himself 6/40, with greatest difficulty as in the picture below:    TODAY'S TREATMENT:  Speak out=SO 10/03/23: Pt took PD med approx 0900.  Today SLP assisted pt with SO to target improving vocal quality and increasing intensity through progressively difficulty speech tasks using Speak Out! Program lesson 16. ST lead pt through  exercises providing singular model prior to pt execution with occasional mod-A required to achieve target dB this date. Averages this date:  Sustained "ah" 88 dB Glides: 87 dB cue to open mouth and glide instead of stair step reading 87dB - with usual mod cues for slowing rate and incr intention with speech. He Id'd and attempted self correction with 80% of rushes of speech Conversational sample between tasks pt averages rushes of speech with occasional min-mod A for deliberate speech.  09/28/23: Pt provided CPIB today. See above for scoring details.  Today SLP assisted pt with SO to target improving vocal quality and increasing intensity through progressively difficulty speech tasks using Speak Out! Program lesson 13. ST leads pt through exercises providing singular model prior to pt execution with occasional mod-A required to achieve target dB this date. Averages this date:  Sustained "ah" 90 dB reading 87dB cognitive speech task 73 dB.  Conversational sample of approx 2 minutes, pt averages rushes of speech with rare mod-A for deliberate speech.   10/8/24Lovie [name] will need CPIB next session. Swallow: SLP taught pt about Mendelsohn. Pt performed x7 reps with fading cues. By 7th rep pt was counting his own 5-second hold appropriately. SLP and pt discussed his swallowing needs to be purposeful, based on the results of his MBS, to be more normalized (timing of vestibule closure is late - SLP postulates that pt is rushing the bolus into the pharynx.The plan is for pt to be more  deliberate in his swallowing.  Speech: Pt is only on lesson 10 in SO book. SLP strongly reiterated consistency is the key to lasting change with his speech. SLP educated Ian [name] today while practicing with SO practice online. SLP provided min A occasionally for slowing rate and exhibiting deliberate speech  09/20/23: Next session SLP will need to teach pt Clair Gulling as a prophylactic swallowing exercise.  Today pt entered  stating he is only doing Chief Strategy Officer. SLP reiterated that pt is to complete SO lesson/day and do an online session. He and wife watched introductory webinar again and Ian [name] said, "It was good to see it again."  Target improving vocal quality and increasing intensity through progressively difficulty speech tasks using Speak Out! program, lesson 2. ST lead pt through exercises providing occasional model prior to pt execution for glides. rare min-A required to achieve target dB this date. Averages this date:  Sustained "ah" 95 dB; SLP suggested pt reduce volume and pt did so with 90dB average reading 85dB average and occasional min-mod cues for intent to reduce speech rate Conversational sample of approx 5 minutes, pt averages 70 dB with occasional mod-A for deliberate speech production for speech rate reduction.  09/13/23 (eval): Evaluation results, recommendations for skilled ST. SLP re-trained pt on SO using Lesson 4, which pt had difficulty with using intentional speech on the reading portion (poem stanzas). SLP told pt to start with Lesson 4, and to go online and participate in online session on [email or web address]. He agreed. SLP also told pt homework was for he and Tonia to watch "what is Parkinson's" 60-minute webinar prior to next session.   PATIENT EDUCATION: Education details: see "today's treatment" above Person educated: Patient Education method: Explanation, Demonstration, Verbal cues, and Handouts Education comprehension: verbalized understanding, returned demonstration, verbal cues required, and needs further education   HOME EXERCISE PROGRAM: Speak Out, beginning with Lesson 4 and completing until Lesson 24 with one lesson/day.   GOALS: Goals reviewed with patient? No  SHORT TERM GOALS: Target date: 10/13/23    Pt will maintain WNL rate of speech during structured practice (oral reading, cognitive exercise, etc.), multi-sentence level, with occasional min-A over 2  sessions.  Baseline: Goal status: INITIAL  2.  Pt will self-ID rushes in speech, as they occur, in 50% of opportunities to increase ability to self-correct rate with occasional min-A over 2 sessions.  Baseline:  Goal status: INITIAL  3.  Pt will report completion of dysarthria HEP at least 1x/day over 1 week period with mod-I Baseline:  Goal status: INITIAL  4. Pt will complete safe swallow strategies with modified independence in 2 sessions  Baseline:  Goal status: INITIAL  5. Pt will complete dysphagia HEP with modified independence in 2 sessions  Baseline:  Goal status: INITIAL  LONG TERM GOALS: Target date: 11/10/23    Pt will demonstrate average WNL rate of speech over 18+ minute conversational sample with no more than 4 rushes of speech over sample period, with rare min-A.  Baseline:  Goal status: INITIAL  2.  Pt will ID and self-correct rushes in rate of speech in 70% of occurences in structured speech practice (e.g. oral reading) over 2 sessions with rare min-A.  Baseline:  Goal status: INITIAL  3.  Pt will report improvement via CPIB PROM by 4 points by d/c, indicative subjective improvement in communicative abilities.  Baseline:  Goal status: INITIAL  ASSESSMENT:  CLINICAL IMPRESSION: Patient is a 62 y.o. M who was seen today for dysarthria in  light of Parkinson's disease. SLP educated pt about SO online and how to practice with this resource. [name] told SLP at eval that he avoids some speaking situations at this time due to his speech.  OBJECTIVE IMPAIRMENTS  Objective impairments include dysarthria. These impairments are limiting patient from effectively communicating at home and in community.Factors affecting potential to achieve goals and functional outcome are medical prognosis.. Patient will benefit from skilled SLP services to address above impairments and improve overall function.  REHAB POTENTIAL: Good  PLAN: SLP FREQUENCY: 2x/week  SLP DURATION: 8  weeks  PLANNED INTERVENTIONS: Aspiration precaution training, Pharyngeal strengthening exercises, Diet toleration management , Internal/external aids, SLP instruction and feedback, Compensatory strategies, and Patient/family education   [facility], CCC-SLP 10/03/2023, 9:40 AM

## 2023-10-10 ENCOUNTER — Ambulatory Visit: Payer: Medicare Other

## 2023-10-10 DIAGNOSIS — R471 Dysarthria and anarthria: Secondary | ICD-10-CM | POA: Diagnosis not present

## 2023-10-10 NOTE — Therapy (Signed)
OUTPATIENT SPEECH LANGUAGE PATHOLOGY PARKINSON'S TREATMENT   Patient Name: Ian [name] MRN: [id number] DOB:08-10-1961, 62 y.o., male Today's Date: 10/10/2023  PCP: Tracey Harries, MD REFERRING PROVIDER: Vladimir Faster, DO   End of Session - 10/10/23 0935     Visit Number 6    Number of Visits 17    Date for SLP Re-Evaluation 11/11/23    SLP Start Time 0935    SLP Stop Time  1015    SLP Time Calculation (min) 40 min    Activity Tolerance Patient tolerated treatment well              Past Medical History:  Diagnosis Date   Anxiety    History of kidney stones    passed   PD (Parkinson's disease) (HCC) 09/10/2013   PONV (postoperative nausea and vomiting)    nausea    Past Surgical History:  Procedure Laterality Date   COLONOSCOPY     MINOR PLACEMENT OF FIDUCIAL N/A 01/31/2017   Procedure: MINOR PLACEMENT OF FIDUCIAL;  Surgeon: Maeola Harman, MD;  [facility];  Service: Neurosurgery;  Laterality: N/A;  Fiducial placement   none     PULSE GENERATOR IMPLANT Right 02/17/2017   Procedure: Right implantable pulse generator placement;  Surgeon: Maeola Harman, MD;  Location: [facility] OR;  Service: Neurosurgery;  Laterality: Right;  Bilateral implantable pulse generator placement   SUBTHALAMIC STIMULATOR BATTERY REPLACEMENT N/A 10/19/2018   Procedure: Deep Brain stimulator implantable pulse generator revision;  Surgeon: Maeola Harman, MD;  Location: [facility] OR;  Service: Neurosurgery;  Laterality: N/A;  Deep Brain stimulator implantable pulse generator revision   SUBTHALAMIC STIMULATOR INSERTION Bilateral 02/10/2017   Procedure: Bilateral Deep brain stimulator placement;  Surgeon: Maeola Harman, MD;  Location: [facility] OR;  Service: Neurosurgery;  Laterality: Bilateral;  Bilateral Deep brain stimulator placement   UPPER GASTROINTESTINAL ENDOSCOPY     Patient Active Problem List   Diagnosis Date Noted   Hip flexor tendon tightness 09/20/2022   Closed fracture of phalanx of left fifth toe  10/19/2021   Tinea versicolor 05/28/2021   Left rotator cuff tear 08/20/2020   Nonallopathic lesion of sacral region 07/25/2018   Nonallopathic lesion of lumbosacral region 07/25/2018   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   Acute bursitis of right shoulder 12/06/2017   Antalgic gait 01/24/2017   Intercostal muscle tear 07/19/2016   Parkinson's disease with dyskinesia and fluctuating manifestations (HCC) 06/24/2016   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   Nonallopathic lesion of thoracic region 01/25/2016   Nonallopathic lesion-rib cage 01/25/2016   Nonallopathic lesion of cervical region 01/25/2016   Parkinson's disease (HCC) 01/25/2016   Lateral epicondylitis 12/01/2014   REM behavioral disorder 08/27/2014   Paralysis agitans (HCC) 10/18/2013    ONSET DATE: dx Parkinson's 2014, script 08/31/23  REFERRING DIAG: G20 (ICD-10-CM) - Parkinson's disease (HCC)  THERAPY DIAG:  Dysarthria and anarthria  SUBJECTIVE:   SUBJECTIVE STATEMENT:  "It (inability to be slower with speech) sort of freaked me out, honestly."  Pt accompanied by: self  PERTINENT HISTORY: Dx with PD Fall 2014, symptoms began 2012. Primary concerns are lethargy, anxiety, fast rate of speech. Had speech screen 08/31/23 and ST was recommended (see "diagnostic findings" below), along with MBS. MBS scheduled 09/19/23.  PAIN:  Are you having pain? No  FALLS: Has patient fallen in last 6 months?  No  DIAGNOSTIC FINDINGS: Encounter Date: 08/31/2023 Speech Therapy Parkinson's Disease Screen       Decibel Level today: low 70s dB  (WNL=70-72 dB) with sound  level meter 30cm away from pt's mouth. Pt's conversational volume has maintained since last treatment course. Primary characteristic of pt's speech is increased rate which decreased intelligibility. Pt endorses this has worsened since last therapy course.   Pt does report difficulty with swallowing, which does warrant further evaluation. Recommend Modified Barium  Swallow Study, ST will place order.  Pt reports challenges with word finding and organization related   Pt would benefit from speech-language eval for dysarthria, bedside swallow, and cognition- please order via EPIC or call [phone] to schedule  MBS 09/19/23 HPI/PMH: HPI: PHMx PD with DBS, followed by Dr. Arbutus Leas, currently being seen by OP ST to address dysarthria. Pt c/o occasional coughing with liquids and sensation of pills sticking. Symptoms have increased over last 3-4 mos per pt report. Clinical Impression: Clinical Impression: Pt presents with overall functional oropharyngeal swallow. Mild deficits observed with laryngeal elevation and delay observed with epiglottic inversion and laryngeal vestibule closure with thin liquids. Laryngeal vestibule observed to achieve full closure after swallow initiation, which allows barium to coat underside of epiglottis. No aspiration or penetration was observed across administered consistencies. Mild pharyngeal residue noted in valleculae and pyriform sinus which clears with cues dry swallow. SLP provided education on recommendations for use of intentional swallow, routine oral care, continuation of regular diet with thin liquids. Advised pt to monitor symptoms and let neurologist, PCP, or primary SLP know of worsening symptoms, as swallow deficits are common with PD. Pt verbalizes understanding, plans to f/u with treating SLP.   PATIENT GOALS : "to get this speech more consistent"  OBJECTIVE:    PATIENT REPORTED OUTCOME MEASURES (PROM): Communication Participation Item Bank: (CPIB) provided 09/28/23. Pt scored himself 6/40, with greatest difficulty as in the picture below:    TODAY'S TREATMENT:  Speak out=SO 10/10/23: Took meds at 0900. Pt is on Lesson 22 of SO today. Today SLP assisted pt with SO to target improving vocal quality and increasing intensity through progressively difficulty speech tasks.ST lead pt through exercises providing singular  model prior to pt execution with occasional mod-A required to achieve target dB this date. Averages this date:  Sustained "ah" average 88 dB Glides: 88 dB cue to open mouth for first production Reading: 78 dB - with occasional mod cues for slowing rate and incr intention with speech. He Id'd and attempted self correction with 70% of rushes of speech Cognitive Task (multiple meaning words): Occasional mod cues for reducing rate Conversational sample between tasks was completed with pt demonstrating usual rushes of speech with usual mod A for deliberate speech. Pt described "s" statement re: situation yesterday when he returned from working out. SLP strongly suggested pt take 20-30 minute nap when he returns fatigued from a workout, or another task. Pt demonstrated understanding.   10/03/23: Pt took PD med approx 0900.  Today SLP assisted pt with SO to target improving vocal quality and increasing intensity through progressively difficulty speech tasks using Speak Out! Program lesson 16. ST lead pt through exercises providing singular model prior to pt execution with occasional mod-A required to achieve target dB this date. Averages this date:  Sustained "ah" 88 dB Glides: 87 dB cue to open mouth and glide instead of stair step reading 87dB - with usual mod cues for slowing rate and incr intention with speech. He Id'd and attempted self correction with 80% of rushes of speech Conversational sample between tasks pt averages rushes of speech with occasional min-mod A for deliberate speech.  09/28/23: Pt provided CPIB today.  See above for scoring details.  Today SLP assisted pt with SO to target improving vocal quality and increasing intensity through progressively difficulty speech tasks using Speak Out! Program lesson 13. ST leads pt through exercises providing singular model prior to pt execution with occasional mod-A required to achieve target dB this date. Averages this date:  Sustained "ah" 90  dB reading 87dB cognitive speech task 73 dB.  Conversational sample of approx 2 minutes, pt averages rushes of speech with rare mod-A for deliberate speech.   10/8/24[name] will need CPIB next session. Swallow: SLP taught pt about Mendelsohn. Pt performed x7 reps with fading cues. By 7th rep pt was counting his own 5-second hold appropriately. SLP and pt discussed his swallowing needs to be purposeful, based on the results of his MBS, to be more normalized (timing of vestibule closure is late - SLP postulates that pt is rushing the bolus into the pharynx.The plan is for pt to be more deliberate in his swallowing.  Speech: Pt is only on lesson 10 in SO book. SLP strongly reiterated consistency is the key to lasting change with his speech. SLP educated [name] today while practicing with SO practice online. SLP provided min A occasionally for slowing rate and exhibiting deliberate speech  09/20/23: Next session SLP will need to teach pt Clair Gulling as a prophylactic swallowing exercise.  Today pt entered stating he is only doing Chief Strategy Officer. SLP reiterated that pt is to complete SO lesson/day and do an online session. He and wife watched introductory webinar again and [name] said, "It was good to see it again."  Target improving vocal quality and increasing intensity through progressively difficulty speech tasks using Speak Out! program, lesson 2. ST lead pt through exercises providing occasional model prior to pt execution for glides. rare min-A required to achieve target dB this date. Averages this date:  Sustained "ah" 95 dB; SLP suggested pt reduce volume and pt did so with 90dB average reading 85dB average and occasional min-mod cues for intent to reduce speech rate Conversational sample of approx 5 minutes, pt averages 70 dB with occasional mod-A for deliberate speech production for speech rate reduction.  09/13/23 (eval): Evaluation results, recommendations for skilled ST. SLP re-trained pt  on SO using Lesson 4, which pt had difficulty with using intentional speech on the reading portion (poem stanzas). SLP told pt to start with Lesson 4, and to go online and participate in online session on [email or web address]. He agreed. SLP also told pt homework was for he and Tonia to watch "what is Parkinson's" 60-minute webinar prior to next session.   PATIENT EDUCATION: Education details: see "today's treatment" above Person educated: Patient Education method: Explanation Education comprehension: verbalized understanding   HOME EXERCISE PROGRAM: Speak Out, beginning with Lesson 4 and completing until Lesson 24 with one lesson/day.   GOALS: Goals reviewed with patient? No  SHORT TERM GOALS: Target date: 10/13/23    Pt will maintain WNL rate of speech during structured practice (oral reading, cognitive exercise, etc.), multi-sentence level, with occasional min-A over 2 sessions.  Baseline: Goal status: INITIAL  2.  Pt will self-ID rushes in speech, as they occur, in 50% of opportunities to increase ability to self-correct rate with occasional min-A over 2 sessions.  Baseline:  Goal status: INITIAL  3.  Pt will report completion of dysarthria HEP at least 1x/day over 1 week period with mod-I Baseline:  Goal status: INITIAL  4. Pt will complete safe swallow strategies with modified independence in  2 sessions  Baseline:  Goal status: INITIAL  5. Pt will complete dysphagia HEP with modified independence in 2 sessions  Baseline:  Goal status: INITIAL  LONG TERM GOALS: Target date: 11/10/23    Pt will demonstrate average WNL rate of speech over 18+ minute conversational sample with no more than 4 rushes of speech over sample period, with rare min-A.  Baseline:  Goal status: INITIAL  2.  Pt will ID and self-correct rushes in rate of speech in 70% of occurences in structured speech practice (e.g. oral reading) over 2 sessions with rare min-A.  Baseline:  Goal status:  INITIAL  3.  Pt will report improvement via CPIB PROM by 4 points by d/c, indicative subjective improvement in communicative abilities.  Baseline:  Goal status: INITIAL  ASSESSMENT:  CLINICAL IMPRESSION: Patient is a 62 y.o. M who was seen today for dysarthria in light of Parkinson's disease. Today SLP educated pt about taking naps may benefit his overall stamina level. [name] told SLP at eval that he avoids some speaking situations at this time due to his speech.  OBJECTIVE IMPAIRMENTS  Objective impairments include dysarthria. These impairments are limiting patient from effectively communicating at home and in community.Factors affecting potential to achieve goals and functional outcome are medical prognosis.. Patient will benefit from skilled SLP services to address above impairments and improve overall function.  REHAB POTENTIAL: Good  PLAN: SLP FREQUENCY: 2x/week  SLP DURATION: 8 weeks  PLANNED INTERVENTIONS: Aspiration precaution training, Pharyngeal strengthening exercises, Diet toleration management , Internal/external aids, SLP instruction and feedback, Compensatory strategies, and Patient/family education   [facility], CCC-SLP 10/10/2023, 9:36 AM

## 2023-10-12 ENCOUNTER — Ambulatory Visit: Payer: Medicare Other

## 2023-10-17 ENCOUNTER — Ambulatory Visit: Payer: Medicare Other

## 2023-10-17 DIAGNOSIS — R471 Dysarthria and anarthria: Secondary | ICD-10-CM | POA: Diagnosis not present

## 2023-10-17 DIAGNOSIS — R1310 Dysphagia, unspecified: Secondary | ICD-10-CM

## 2023-10-17 NOTE — Therapy (Signed)
OUTPATIENT SPEECH LANGUAGE PATHOLOGY PARKINSON'S TREATMENT   Patient Name: Ian [name] MRN: [id number] DOB:09/21/1961, 62 y.o., male Today's Date: 10/17/2023  PCP: Tracey Harries, MD REFERRING PROVIDER: Vladimir Faster, DO   End of Session - 10/17/23 1245     Visit Number 7    Number of Visits 17    Date for SLP Re-Evaluation 11/11/23    SLP Start Time 1106    SLP Stop Time  1146    SLP Time Calculation (min) 40 min    Activity Tolerance Patient tolerated treatment well               Past Medical History:  Diagnosis Date   Anxiety    History of kidney stones    passed   PD (Parkinson's disease) (HCC) 09/10/2013   PONV (postoperative nausea and vomiting)    nausea    Past Surgical History:  Procedure Laterality Date   COLONOSCOPY     MINOR PLACEMENT OF FIDUCIAL N/A 01/31/2017   Procedure: MINOR PLACEMENT OF FIDUCIAL;  Surgeon: Maeola Harman, MD;  Location: [facility] OR;  Service: Neurosurgery;  Laterality: N/A;  Fiducial placement   none     PULSE GENERATOR IMPLANT Right 02/17/2017   Procedure: Right implantable pulse generator placement;  Surgeon: Maeola Harman, MD;  Location: [facility] OR;  Service: Neurosurgery;  Laterality: Right;  Bilateral implantable pulse generator placement   SUBTHALAMIC STIMULATOR BATTERY REPLACEMENT N/A 10/19/2018   Procedure: Deep Brain stimulator implantable pulse generator revision;  Surgeon: Maeola Harman, MD;  Location: [facility] OR;  Service: Neurosurgery;  Laterality: N/A;  Deep Brain stimulator implantable pulse generator revision   SUBTHALAMIC STIMULATOR INSERTION Bilateral 02/10/2017   Procedure: Bilateral Deep brain stimulator placement;  Surgeon: Maeola Harman, MD;  Location: [facility] OR;  Service: Neurosurgery;  Laterality: Bilateral;  Bilateral Deep brain stimulator placement   UPPER GASTROINTESTINAL ENDOSCOPY     Patient Active Problem List   Diagnosis Date Noted   Hip flexor tendon tightness 09/20/2022   Closed fracture of phalanx of left fifth toe  10/19/2021   Tinea versicolor 05/28/2021   Left rotator cuff tear 08/20/2020   Nonallopathic lesion of sacral region 07/25/2018   Nonallopathic lesion of lumbosacral region 07/25/2018   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   Acute bursitis of right shoulder 12/06/2017   Antalgic gait 01/24/2017   Intercostal muscle tear 07/19/2016   Parkinson's disease with dyskinesia and fluctuating manifestations (HCC) 06/24/2016   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   Nonallopathic lesion of thoracic region 01/25/2016   Nonallopathic lesion-rib cage 01/25/2016   Nonallopathic lesion of cervical region 01/25/2016   Parkinson's disease (HCC) 01/25/2016   Lateral epicondylitis 12/01/2014   REM behavioral disorder 08/27/2014   Paralysis agitans (HCC) 10/18/2013    ONSET DATE: dx Parkinson's 2014, script 08/31/23  REFERRING DIAG: G20 (ICD-10-CM) - Parkinson's disease (HCC)  THERAPY DIAG:  Dysarthria and anarthria  Dysphagia, unspecified type  SUBJECTIVE:   SUBJECTIVE STATEMENT:  "A little chilly out there today." (Took SLP two repeats to understand pt)  Pt accompanied by: self  PERTINENT HISTORY: Dx with PD Fall 2014, symptoms began 2012. Primary concerns are lethargy, anxiety, fast rate of speech. Had speech screen 08/31/23 and ST was recommended (see "diagnostic findings" below), along with MBS. MBS scheduled 09/19/23.  PAIN:  Are you having pain? No  FALLS: Has patient fallen in last 6 months?  No  DIAGNOSTIC FINDINGS: Encounter Date: 08/31/2023 Speech Therapy Parkinson's Disease Screen       Decibel Level today: low 70s dB  (  WNL=70-72 dB) with sound level meter 30cm away from pt's mouth. Pt's conversational volume has maintained since last treatment course. Primary characteristic of pt's speech is increased rate which decreased intelligibility. Pt endorses this has worsened since last therapy course.   Pt does report difficulty with swallowing, which does warrant further evaluation.  Recommend Modified Barium Swallow Study, ST will place order.  Pt reports challenges with word finding and organization related   Pt would benefit from speech-language eval for dysarthria, bedside swallow, and cognition- please order via EPIC or call [phone] to schedule  MBS 09/19/23 HPI/PMH: HPI: PHMx PD with DBS, followed by Dr. Arbutus Leas, currently being seen by OP ST to address dysarthria. Pt c/o occasional coughing with liquids and sensation of pills sticking. Symptoms have increased over last 3-4 mos per pt report. Clinical Impression: Clinical Impression: Pt presents with overall functional oropharyngeal swallow. Mild deficits observed with laryngeal elevation and delay observed with epiglottic inversion and laryngeal vestibule closure with thin liquids. Laryngeal vestibule observed to achieve full closure after swallow initiation, which allows barium to coat underside of epiglottis. No aspiration or penetration was observed across administered consistencies. Mild pharyngeal residue noted in valleculae and pyriform sinus which clears with cues dry swallow. SLP provided education on recommendations for use of intentional swallow, routine oral care, continuation of regular diet with thin liquids. Advised pt to monitor symptoms and let neurologist, PCP, or primary SLP know of worsening symptoms, as swallow deficits are common with PD. Pt verbalizes understanding, plans to f/u with treating SLP.   PATIENT GOALS : "to get this speech more consistent"  OBJECTIVE:    PATIENT REPORTED OUTCOME MEASURES (PROM): Communication Participation Item Bank: (CPIB) provided 09/28/23. Pt scored himself 6/40, with greatest difficulty as in the picture below:    TODAY'S TREATMENT:  Speak out=SO 10/17/23: Took meds at 0830, will take again at 1300. Pt has completed SO Lesson 24 since last session. He has practiced using online practice or practice in the car while away from home this weekend. Pt endorsed being  more stressed today and the more he and SLP discussed his speech the more dysfluent he became. SLP guided pt through Lesson 20 which pt stated would be the last lesson that had a reading section which he felt he had at least 80% intention/deliberate speech.  Sustained "ah" average 87 dB Glides: 86 dB cue to open mouth for first production Reading: 76 dB - with occasional min cues for incr intent with speech. He Id'd and attempted self correction in one instance of rushed speech SLP and pt also discussed pt's question of DBS making speech worse, which he had seen online. SLP encouraged pt to reach out to his neurologist and surgeon, as this can occur, but SLP was unsure of details of his system to provide him with more information. Pt stated he felt he needed to be more deliberate about practicing throughout the day so SLP suggested picking at least two 30-minute time periods to put intense focus on deliberate intentional speech. Pt agreed this would be helpful for him.   10/10/23: Took meds at 0900. Pt is on Lesson 22 of SO today. Today SLP assisted pt with SO to target improving vocal quality and increasing intensity through progressively difficulty speech tasks.ST lead pt through exercises providing singular model prior to pt execution with occasional mod-A required to achieve target dB this date. Averages this date:  Sustained "ah" average 88 dB Glides: 88 dB cue to open mouth for first production  Reading: 78 dB - with occasional mod cues for slowing rate and incr intention with speech. He Id'd and attempted self correction with 70% of rushes of speech Cognitive Task (multiple meaning words): Occasional mod cues for reducing rate Conversational sample between tasks was completed with pt demonstrating usual rushes of speech with usual mod A for deliberate speech. Pt described "s" statement re: situation yesterday when he returned from working out. SLP strongly suggested pt take 20-30 minute nap when he  returns fatigued from a workout, or another task. Pt demonstrated understanding.   10/03/23: Pt took PD med approx 0900.  Today SLP assisted pt with SO to target improving vocal quality and increasing intensity through progressively difficulty speech tasks using Speak Out! Program lesson 16. ST lead pt through exercises providing singular model prior to pt execution with occasional mod-A required to achieve target dB this date. Averages this date:  Sustained "ah" 88 dB Glides: 87 dB cue to open mouth and glide instead of stair step reading 87dB - with usual mod cues for slowing rate and incr intention with speech. He Id'd and attempted self correction with 80% of rushes of speech Conversational sample between tasks pt averages rushes of speech with occasional min-mod A for deliberate speech.  09/28/23: Pt provided CPIB today. See above for scoring details.  Today SLP assisted pt with SO to target improving vocal quality and increasing intensity through progressively difficulty speech tasks using Speak Out! Program lesson 13. ST leads pt through exercises providing singular model prior to pt execution with occasional mod-A required to achieve target dB this date. Averages this date:  Sustained "ah" 90 dB reading 87dB cognitive speech task 73 dB.  Conversational sample of approx 2 minutes, pt averages rushes of speech with rare mod-A for deliberate speech.   10/8/24[name] will need CPIB next session. Swallow: SLP taught pt about Mendelsohn. Pt performed x7 reps with fading cues. By 7th rep pt was counting his own 5-second hold appropriately. SLP and pt discussed his swallowing needs to be purposeful, based on the results of his MBS, to be more normalized (timing of vestibule closure is late - SLP postulates that pt is rushing the bolus into the pharynx.The plan is for pt to be more deliberate in his swallowing.  Speech: Pt is only on lesson 10 in SO book. SLP strongly reiterated consistency is the  key to lasting change with his speech. SLP educated [name] today while practicing with SO practice online. SLP provided min A occasionally for slowing rate and exhibiting deliberate speech  09/20/23: Next session SLP will need to teach pt Clair Gulling as a prophylactic swallowing exercise.  Today pt entered stating he is only doing Chief Strategy Officer. SLP reiterated that pt is to complete SO lesson/day and do an online session. He and wife watched introductory webinar again and [name] said, "It was good to see it again."  Target improving vocal quality and increasing intensity through progressively difficulty speech tasks using Speak Out! program, lesson 2. ST lead pt through exercises providing occasional model prior to pt execution for glides. rare min-A required to achieve target dB this date. Averages this date:  Sustained "ah" 95 dB; SLP suggested pt reduce volume and pt did so with 90dB average reading 85dB average and occasional min-mod cues for intent to reduce speech rate Conversational sample of approx 5 minutes, pt averages 70 dB with occasional mod-A for deliberate speech production for speech rate reduction.  09/13/23 (eval): Evaluation results, recommendations for skilled  ST. SLP re-trained pt on SO using Lesson 4, which pt had difficulty with using intentional speech on the reading portion (poem stanzas). SLP told pt to start with Lesson 4, and to go online and participate in online session on [email or web address]. He agreed. SLP also told pt homework was for he and Tonia to watch "what is Parkinson's" 60-minute webinar prior to next session.   PATIENT EDUCATION: Education details: see "today's treatment" above Person educated: Patient Education method: Explanation Education comprehension: verbalized understanding   HOME EXERCISE PROGRAM: Speak Out, beginning with Lesson 4 and completing until Lesson 24 with one lesson/day.   GOALS: Goals reviewed with patient? No  SHORT  TERM GOALS: Target date: 10/13/23    Pt will maintain WNL rate of speech during structured practice (oral reading, cognitive exercise, etc.), multi-sentence level, with occasional min-A over 2 sessions.  Baseline: Goal status: Not met  2.  Pt will self-ID rushes in speech, as they occur, in 50% of opportunities to increase ability to self-correct rate with occasional min-A over 2 sessions.  Baseline:  Goal status: Not met  3.  Pt will report completion of dysarthria HEP at least 1x/day over 1 week period with mod-I Baseline:  Goal status: Not met  4. Pt will complete safe swallow strategies with modified independence in 2 sessions  Baseline:  Goal status: Deferred to work on speech  5. Pt will complete dysphagia HEP with modified independence in 2 sessions  Baseline:  Goal status: Deferred to work on speech  LONG TERM GOALS: Target date: 11/10/23    Pt will demonstrate average WNL rate of speech over 18+ minute conversational sample with no more than 4 rushes of speech over sample period, with rare min-A.  Baseline:  Goal status: INITIAL  2.  Pt will ID and self-correct rushes in rate of speech in 70% of occurences in structured speech practice (e.g. oral reading) over 2 sessions with rare min-A.  Baseline:  Goal status: INITIAL  3.  Pt will report improvement via CPIB PROM by 4 points by d/c, indicative subjective improvement in communicative abilities.  Baseline:  Goal status: INITIAL  ASSESSMENT:  CLINICAL IMPRESSION: Patient is a 62 y.o. M who was seen today for dysarthria in light of Parkinson's disease. See "today's treatment" for more details. [name] told SLP at eval that he avoids some speaking situations at this time due to his speech, which he confirmed he still does during the session today.  OBJECTIVE IMPAIRMENTS  Objective impairments include dysarthria. These impairments are limiting patient from effectively communicating at home and in community.Factors  affecting potential to achieve goals and functional outcome are medical prognosis.. Patient will benefit from skilled SLP services to address above impairments and improve overall function.  REHAB POTENTIAL: Good  PLAN: SLP FREQUENCY: 2x/week  SLP DURATION: 8 weeks  PLANNED INTERVENTIONS: Aspiration precaution training, Pharyngeal strengthening exercises, Diet toleration management , Internal/external aids, SLP instruction and feedback, Compensatory strategies, and Patient/family education   [facility], CCC-SLP 10/17/2023, 12:45 PM

## 2023-10-19 ENCOUNTER — Ambulatory Visit: Payer: Medicare Other

## 2023-10-19 DIAGNOSIS — R471 Dysarthria and anarthria: Secondary | ICD-10-CM | POA: Diagnosis not present

## 2023-10-19 DIAGNOSIS — R1310 Dysphagia, unspecified: Secondary | ICD-10-CM

## 2023-10-19 NOTE — Therapy (Signed)
OUTPATIENT SPEECH LANGUAGE PATHOLOGY PARKINSON'S TREATMENT   Patient Name: Ian [name] MRN: [id number] DOB:05-Apr-1961, 62 y.o., male Today's Date: 10/19/2023  PCP: Tracey Harries, MD REFERRING PROVIDER: Vladimir Faster, DO   End of Session - 10/19/23 1452     Visit Number 8    Number of Visits 17    Date for SLP Re-Evaluation 11/11/23    SLP Start Time 0934    SLP Stop Time  1016    SLP Time Calculation (min) 42 min    Activity Tolerance Patient tolerated treatment well                Past Medical History:  Diagnosis Date   Anxiety    History of kidney stones    passed   PD (Parkinson's disease) (HCC) 09/10/2013   PONV (postoperative nausea and vomiting)    nausea    Past Surgical History:  Procedure Laterality Date   COLONOSCOPY     MINOR PLACEMENT OF FIDUCIAL N/A 01/31/2017   Procedure: MINOR PLACEMENT OF FIDUCIAL;  Surgeon: Maeola Harman, MD;  Location[facility] OR;  Service: Neurosurgery;  Laterality: N/A;  Fiducial placement   none     PULSE GENERATOR IMPLANT Right 02/17/2017   Procedure: Right implantable pulse generator placement;  Surgeon: Maeola Harman, MD;  Location: [facility] OR;  Service: Neurosurgery;  Laterality: Right;  Bilateral implantable pulse generator placement   SUBTHALAMIC STIMULATOR BATTERY REPLACEMENT N/A 10/19/2018   Procedure: Deep Brain stimulator implantable pulse generator revision;  Surgeon: Maeola Harman, MD;  Location: [facility] OR;  Service: Neurosurgery;  Laterality: N/A;  Deep Brain stimulator implantable pulse generator revision   SUBTHALAMIC STIMULATOR INSERTION Bilateral 02/10/2017   Procedure: Bilateral Deep brain stimulator placement;  Surgeon: Maeola Harman, MD;  Location: [facility] OR;  Service: Neurosurgery;  Laterality: Bilateral;  Bilateral Deep brain stimulator placement   UPPER GASTROINTESTINAL ENDOSCOPY     Patient Active Problem List   Diagnosis Date Noted   Hip flexor tendon tightness 09/20/2022   Closed fracture of phalanx of left fifth toe  10/19/2021   Tinea versicolor 05/28/2021   Left rotator cuff tear 08/20/2020   Nonallopathic lesion of sacral region 07/25/2018   Nonallopathic lesion of lumbosacral region 07/25/2018   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   Acute bursitis of right shoulder 12/06/2017   Antalgic gait 01/24/2017   Intercostal muscle tear 07/19/2016   Parkinson's disease with dyskinesia and fluctuating manifestations (HCC) 06/24/2016   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   Nonallopathic lesion of thoracic region 01/25/2016   Nonallopathic lesion-rib cage 01/25/2016   Nonallopathic lesion of cervical region 01/25/2016   Parkinson's disease (HCC) 01/25/2016   Lateral epicondylitis 12/01/2014   REM behavioral disorder 08/27/2014   Paralysis agitans (HCC) 10/18/2013    ONSET DATE: dx Parkinson's 2014, script 08/31/23  REFERRING DIAG: G20 (ICD-10-CM) - Parkinson's disease (HCC)  THERAPY DIAG:  Dysarthria and anarthria  Dysphagia, unspecified type  SUBJECTIVE:   SUBJECTIVE STATEMENT:  "A little chilly out there today." (Took SLP two repeats to understand pt)  Pt accompanied by: self  PERTINENT HISTORY: Dx with PD Fall 2014, symptoms began 2012. Primary concerns are lethargy, anxiety, fast rate of speech. Had speech screen 08/31/23 and ST was recommended (see "diagnostic findings" below), along with MBS. MBS scheduled 09/19/23.  PAIN:  Are you having pain? No  FALLS: Has patient fallen in last 6 months?  No  DIAGNOSTIC FINDINGS: Encounter Date: 08/31/2023 Speech Therapy Parkinson's Disease Screen       Decibel Level today: low 70s  dB  (WNL=70-72 dB) with sound level meter 30cm away from pt's mouth. Pt's conversational volume has maintained since last treatment course. Primary characteristic of pt's speech is increased rate which decreased intelligibility. Pt endorses this has worsened since last therapy course.   Pt does report difficulty with swallowing, which does warrant further evaluation.  Recommend Modified Barium Swallow Study, ST will place order.  Pt reports challenges with word finding and organization related   Pt would benefit from speech-language eval for dysarthria, bedside swallow, and cognition- please order via EPIC or call [phone] to schedule  MBS 09/19/23 HPI/PMH: HPI: PHMx PD with DBS, followed by Dr. Arbutus Leas, currently being seen by OP ST to address dysarthria. Pt c/o occasional coughing with liquids and sensation of pills sticking. Symptoms have increased over last 3-4 mos per pt report. Clinical Impression: Clinical Impression: Pt presents with overall functional oropharyngeal swallow. Mild deficits observed with laryngeal elevation and delay observed with epiglottic inversion and laryngeal vestibule closure with thin liquids. Laryngeal vestibule observed to achieve full closure after swallow initiation, which allows barium to coat underside of epiglottis. No aspiration or penetration was observed across administered consistencies. Mild pharyngeal residue noted in valleculae and pyriform sinus which clears with cues dry swallow. SLP provided education on recommendations for use of intentional swallow, routine oral care, continuation of regular diet with thin liquids. Advised pt to monitor symptoms and let neurologist, PCP, or primary SLP know of worsening symptoms, as swallow deficits are common with PD. Pt verbalizes understanding, plans to f/u with treating SLP.   PATIENT GOALS : "to get this speech more consistent"  OBJECTIVE:    PATIENT REPORTED OUTCOME MEASURES (PROM): Communication Participation Item Bank: (CPIB) provided 09/28/23. Pt scored himself 6/40, with greatest difficulty as in the picture below:    TODAY'S TREATMENT:  Speak out=SO 10/19/23: Meds today at 0845. Pt arrived again with very fast speech with decr'd intelligibility. Given pt's past comments about his (negative) feelings about his speech, SLP inquired about his neurological vs  psychological effects of his speech. SLP ascertained that certain conversations and/or pt's perception of certain conversations make it more difficult for him to focus on intent, being deliberate, and mindful. Today SLP encouraged pt to think about what about these situations make it more challenging for him and think about how those feelings could be mitigated/eliminated.  Pt practiced deliberate speech with 2-3 sentence responses with self correction and rare min A for intent. SLP stressed pt needs to practice at this level for improved success with deliberate speech which should reduce pt's rate to approximate a more normal rate. SLP told pt to cont to practice at this level until next session.  10/17/23: Took meds at 0830, will take again at 1300. Pt has completed SO Lesson 24 since last session. He has practiced using online practice or practice in the car while away from home this weekend. Pt endorsed being more stressed today and the more he and SLP discussed his speech the more dysfluent he became. SLP guided pt through Lesson 20 which pt stated would be the last lesson that had a reading section which he felt he had at least 80% intention/deliberate speech.  Sustained "ah" average 87 dB Glides: 86 dB cue to open mouth for first production Reading: 76 dB - with occasional min cues for incr intent with speech. He Id'd and attempted self correction in one instance of rushed speech SLP and pt also discussed pt's question of DBS making speech worse, which he  had seen online. SLP encouraged pt to reach out to his neurologist and surgeon, as this can occur, but SLP was unsure of details of his system to provide him with more information. Pt stated he felt he needed to be more deliberate about practicing throughout the day so SLP suggested picking at least two 30-minute time periods to put intense focus on deliberate intentional speech. Pt agreed this would be helpful for him.   10/10/23: Took meds at  0900. Pt is on Lesson 22 of SO today. Today SLP assisted pt with SO to target improving vocal quality and increasing intensity through progressively difficulty speech tasks.ST lead pt through exercises providing singular model prior to pt execution with occasional mod-A required to achieve target dB this date. Averages this date:  Sustained "ah" average 88 dB Glides: 88 dB cue to open mouth for first production Reading: 78 dB - with occasional mod cues for slowing rate and incr intention with speech. He Id'd and attempted self correction with 70% of rushes of speech Cognitive Task (multiple meaning words): Occasional mod cues for reducing rate Conversational sample between tasks was completed with pt demonstrating usual rushes of speech with usual mod A for deliberate speech. Pt described "s" statement re: situation yesterday when he returned from working out. SLP strongly suggested pt take 20-30 minute nap when he returns fatigued from a workout, or another task. Pt demonstrated understanding.   10/03/23: Pt took PD med approx 0900.  Today SLP assisted pt with SO to target improving vocal quality and increasing intensity through progressively difficulty speech tasks using Speak Out! Program lesson 16. ST lead pt through exercises providing singular model prior to pt execution with occasional mod-A required to achieve target dB this date. Averages this date:  Sustained "ah" 88 dB Glides: 87 dB cue to open mouth and glide instead of stair step reading 87dB - with usual mod cues for slowing rate and incr intention with speech. He Id'd and attempted self correction with 80% of rushes of speech Conversational sample between tasks pt averages rushes of speech with occasional min-mod A for deliberate speech.  09/28/23: Pt provided CPIB today. See above for scoring details.  Today SLP assisted pt with SO to target improving vocal quality and increasing intensity through progressively difficulty speech tasks  using Speak Out! Program lesson 13. ST leads pt through exercises providing singular model prior to pt execution with occasional mod-A required to achieve target dB this date. Averages this date:  Sustained "ah" 90 dB reading 87dB cognitive speech task 73 dB.  Conversational sample of approx 2 minutes, pt averages rushes of speech with rare mod-A for deliberate speech.   10/8/24[name] will need CPIB next session. Swallow: SLP taught pt about Mendelsohn. Pt performed x7 reps with fading cues. By 7th rep pt was counting his own 5-second hold appropriately. SLP and pt discussed his swallowing needs to be purposeful, based on the results of his MBS, to be more normalized (timing of vestibule closure is late - SLP postulates that pt is rushing the bolus into the pharynx.The plan is for pt to be more deliberate in his swallowing.  Speech: Pt is only on lesson 10 in SO book. SLP strongly reiterated consistency is the key to lasting change with his speech. SLP educated [name] today while practicing with SO practice online. SLP provided min A occasionally for slowing rate and exhibiting deliberate speech  09/20/23: Next session SLP will need to teach pt Clair Gulling as a prophylactic swallowing  exercise.  Today pt entered stating he is only doing Chief Strategy Officer. SLP reiterated that pt is to complete SO lesson/day and do an online session. He and wife watched introductory webinar again and [name] said, "It was good to see it again."  Target improving vocal quality and increasing intensity through progressively difficulty speech tasks using Speak Out! program, lesson 2. ST lead pt through exercises providing occasional model prior to pt execution for glides. rare min-A required to achieve target dB this date. Averages this date:  Sustained "ah" 95 dB; SLP suggested pt reduce volume and pt did so with 90dB average reading 85dB average and occasional min-mod cues for intent to reduce speech rate Conversational  sample of approx 5 minutes, pt averages 70 dB with occasional mod-A for deliberate speech production for speech rate reduction.  09/13/23 (eval): Evaluation results, recommendations for skilled ST. SLP re-trained pt on SO using Lesson 4, which pt had difficulty with using intentional speech on the reading portion (poem stanzas). SLP told pt to start with Lesson 4, and to go online and participate in online session on [email or web address]. He agreed. SLP also told pt homework was for he and Tonia to watch "what is Parkinson's" 60-minute webinar prior to next session.   PATIENT EDUCATION: Education details: see "today's treatment" above Person educated: Patient Education method: Explanation Education comprehension: verbalized understanding   HOME EXERCISE PROGRAM: Speak Out, beginning with Lesson 4 and completing until Lesson 24 with one lesson/day.   GOALS: Goals reviewed with patient? No  SHORT TERM GOALS: Target date: 10/13/23    Pt will maintain WNL rate of speech during structured practice (oral reading, cognitive exercise, etc.), multi-sentence level, with occasional min-A over 2 sessions.  Baseline: Goal status: Not met  2.  Pt will self-ID rushes in speech, as they occur, in 50% of opportunities to increase ability to self-correct rate with occasional min-A over 2 sessions.  Baseline:  Goal status: Not met  3.  Pt will report completion of dysarthria HEP at least 1x/day over 1 week period with mod-I Baseline:  Goal status: Not met  4. Pt will complete safe swallow strategies with modified independence in 2 sessions  Baseline:  Goal status: Deferred to work on speech  5. Pt will complete dysphagia HEP with modified independence in 2 sessions  Baseline:  Goal status: Deferred to work on speech  LONG TERM GOALS: Target date: 11/10/23    Pt will demonstrate average WNL rate of speech over 18+ minute conversational sample with no more than 4 rushes of speech over  sample period, with rare min-A.  Baseline:  Goal status: INITIAL  2.  Pt will ID and self-correct rushes in rate of speech in 70% of occurences in structured speech practice (e.g. oral reading) over 2 sessions with rare min-A.  Baseline:  Goal status: INITIAL  3.  Pt will report improvement via CPIB PROM by 4 points by d/c, indicative subjective improvement in communicative abilities.  Baseline:  Goal status: INITIAL  ASSESSMENT:  CLINICAL IMPRESSION: Patient is a 62 y.o. M who was seen today for dysarthria in light of Parkinson's disease. See "today's treatment" for more details. [name] told SLP at eval that he avoids some speaking situations at this time due to his speech, which he confirmed he still does during the session today.  OBJECTIVE IMPAIRMENTS  Objective impairments include dysarthria. These impairments are limiting patient from effectively communicating at home and in community.Factors affecting potential to achieve goals and functional outcome are  medical prognosis.. Patient will benefit from skilled SLP services to address above impairments and improve overall function.  REHAB POTENTIAL: Good  PLAN: SLP FREQUENCY: 2x/week  SLP DURATION: 8 weeks  PLANNED INTERVENTIONS: Aspiration precaution training, Pharyngeal strengthening exercises, Diet toleration management , Internal/external aids, SLP instruction and feedback, Compensatory strategies, and Patient/family education   [facility], CCC-SLP 10/19/2023, 2:54 PM

## 2023-10-24 ENCOUNTER — Ambulatory Visit: Payer: Medicare Other | Attending: Family Medicine

## 2023-10-24 DIAGNOSIS — R471 Dysarthria and anarthria: Secondary | ICD-10-CM | POA: Diagnosis present

## 2023-10-24 DIAGNOSIS — R1310 Dysphagia, unspecified: Secondary | ICD-10-CM | POA: Diagnosis present

## 2023-10-24 NOTE — Therapy (Signed)
OUTPATIENT SPEECH LANGUAGE PATHOLOGY PARKINSON'S TREATMENT   Patient Name: Ian [name] MRN: [id number] DOB:[birth year], 62 y.o., male Today's Date: 10/24/2023  PCP: Tracey Harries, MD REFERRING PROVIDER: Vladimir Faster, DO   End of Session - 10/24/23 0939     Visit Number 9    Number of Visits 17    Date for SLP Re-Evaluation 11/11/23    SLP Start Time 0936    SLP Stop Time  1015    SLP Time Calculation (min) 39 min    Activity Tolerance Patient tolerated treatment well                Past Medical History:  Diagnosis Date   Anxiety    History of kidney stones    passed   PD (Parkinson's disease) (HCC) 09/10/2013   PONV (postoperative nausea and vomiting)    nausea    Past Surgical History:  Procedure Laterality Date   COLONOSCOPY     MINOR PLACEMENT OF FIDUCIAL N/A 01/31/2017   Procedure: MINOR PLACEMENT OF FIDUCIAL;  Surgeon: Maeola Harman, MD;  Location: [facility] OR;  Service: Neurosurgery;  Laterality: N/A;  Fiducial placement   none     PULSE GENERATOR IMPLANT Right 02/17/2017   Procedure: Right implantable pulse generator placement;  Surgeon: Maeola Harman, MD;  Location: [facility] OR;  Service: Neurosurgery;  Laterality: Right;  Bilateral implantable pulse generator placement   SUBTHALAMIC STIMULATOR BATTERY REPLACEMENT N/A 10/19/2018   Procedure: Deep Brain stimulator implantable pulse generator revision;  Surgeon: Maeola Harman, MD;  Location: [facility] OR;  Service: Neurosurgery;  Laterality: N/A;  Deep Brain stimulator implantable pulse generator revision   SUBTHALAMIC STIMULATOR INSERTION Bilateral 02/10/2017   Procedure: Bilateral Deep brain stimulator placement;  Surgeon: Maeola Harman, MD;  Location: [facility] OR;  Service: Neurosurgery;  Laterality: Bilateral;  Bilateral Deep brain stimulator placement   UPPER GASTROINTESTINAL ENDOSCOPY     Patient Active Problem List   Diagnosis Date Noted   Hip flexor tendon tightness 09/20/2022   Closed fracture of phalanx of left fifth toe  10/19/2021   Tinea versicolor 05/28/2021   Left rotator cuff tear 08/20/2020   Nonallopathic lesion of sacral region 07/25/2018   Nonallopathic lesion of lumbosacral region 07/25/2018   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   Acute bursitis of right shoulder 12/06/2017   Antalgic gait 01/24/2017   Intercostal muscle tear 07/19/2016   Parkinson's disease with dyskinesia and fluctuating manifestations (HCC) 06/24/2016   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   Nonallopathic lesion of thoracic region 01/25/2016   Nonallopathic lesion-rib cage 01/25/2016   Nonallopathic lesion of cervical region 01/25/2016   Parkinson's disease (HCC) 01/25/2016   Lateral epicondylitis 12/01/2014   REM behavioral disorder 08/27/2014   Paralysis agitans (HCC) 10/18/2013    ONSET DATE: dx Parkinson's 2014, script 08/31/23  REFERRING DIAG: G20 (ICD-10-CM) - Parkinson's disease (HCC)  THERAPY DIAG:  Dysarthria and anarthria  Dysphagia, unspecified type  SUBJECTIVE:   SUBJECTIVE STATEMENT:  "I've been using the cognitive sections (for practice)."  Pt accompanied by: self  PERTINENT HISTORY: Dx with PD Fall 2014, symptoms began 2012. Primary concerns are lethargy, anxiety, fast rate of speech. Had speech screen 08/31/23 and ST was recommended (see "diagnostic findings" below), along with MBS. MBS scheduled 09/19/23.  PAIN:  Are you having pain? No  FALLS: Has patient fallen in last 6 months?  No  DIAGNOSTIC FINDINGS: Encounter Date: 08/31/2023 Speech Therapy Parkinson's Disease Screen       Decibel Level today: low 70s dB  (WNL=70-72 dB) with  sound level meter 30cm away from pt's mouth. Pt's conversational volume has maintained since last treatment course. Primary characteristic of pt's speech is increased rate which decreased intelligibility. Pt endorses this has worsened since last therapy course.   Pt does report difficulty with swallowing, which does warrant further evaluation. Recommend Modified  Barium Swallow Study, ST will place order.  Pt reports challenges with word finding and organization related   Pt would benefit from speech-language eval for dysarthria, bedside swallow, and cognition- please order via EPIC or call [phone] to schedule  MBS 09/19/23 HPI/PMH: HPI: PHMx PD with DBS, followed by Dr. Arbutus Leas, currently being seen by OP ST to address dysarthria. Pt c/o occasional coughing with liquids and sensation of pills sticking. Symptoms have increased over last 3-4 mos per pt report. Clinical Impression: Clinical Impression: Pt presents with overall functional oropharyngeal swallow. Mild deficits observed with laryngeal elevation and delay observed with epiglottic inversion and laryngeal vestibule closure with thin liquids. Laryngeal vestibule observed to achieve full closure after swallow initiation, which allows barium to coat underside of epiglottis. No aspiration or penetration was observed across administered consistencies. Mild pharyngeal residue noted in valleculae and pyriform sinus which clears with cues dry swallow. SLP provided education on recommendations for use of intentional swallow, routine oral care, continuation of regular diet with thin liquids. Advised pt to monitor symptoms and let neurologist, PCP, or primary SLP know of worsening symptoms, as swallow deficits are common with PD. Pt verbalizes understanding, plans to f/u with treating SLP.   PATIENT GOALS : "to get this speech more consistent"  OBJECTIVE:    PATIENT REPORTED OUTCOME MEASURES (PROM): Communication Participation Item Bank: (CPIB) provided 09/28/23. Pt scored himself 6/40, with greatest difficulty as in the picture below:    TODAY'S TREATMENT:  Speak out=SO 10/24/23: Meds at 0900. Better speech rate today than 10/19/23 upon entering ST room. Has been more faithful at practicing the cognitive portions of the SO lessons since last session. Today SLP had pt warm up with some spelling divergent  naming task. Pt was also assisted in two sentence responses to incr intent and purpose in his speech in order to slow speech rate. 50% success until pt began to chunk words in his sentences, then success incr'd to approx 80%. SLP strongly encouraged pt to use this strategy in his practice sessions and as he speaks with friends and family.   10/19/23: Meds today at 0845. Pt arrived again with very fast speech with decr'd intelligibility. Given pt's past comments about his (negative) feelings about his speech, SLP inquired about his neurological vs psychological effects of his speech. SLP ascertained that certain conversations and/or pt's perception of certain conversations make it more difficult for him to focus on intent, being deliberate, and mindful. Today SLP encouraged pt to think about what about these situations make it more challenging for him and think about how those feelings could be mitigated/eliminated.  Pt practiced deliberate speech with 2-3 sentence responses with self correction and rare min A for intent. SLP stressed pt needs to practice at this level for improved success with deliberate speech which should reduce pt's rate to approximate a more normal rate. SLP told pt to cont to practice at this level until next session.  10/17/23: Took meds at 0830, will take again at 1300. Pt has completed SO Lesson 24 since last session. He has practiced using online practice or practice in the car while away from home this weekend. Pt endorsed being more stressed today  and the more he and SLP discussed his speech the more dysfluent he became. SLP guided pt through Lesson 20 which pt stated would be the last lesson that had a reading section which he felt he had at least 80% intention/deliberate speech.  Sustained "ah" average 87 dB Glides: 86 dB cue to open mouth for first production Reading: 76 dB - with occasional min cues for incr intent with speech. He Id'd and attempted self correction in one  instance of rushed speech SLP and pt also discussed pt's question of DBS making speech worse, which he had seen online. SLP encouraged pt to reach out to his neurologist and surgeon, as this can occur, but SLP was unsure of details of his system to provide him with more information. Pt stated he felt he needed to be more deliberate about practicing throughout the day so SLP suggested picking at least two 30-minute time periods to put intense focus on deliberate intentional speech. Pt agreed this would be helpful for him.   10/10/23: Took meds at 0900. Pt is on Lesson 22 of SO today. Today SLP assisted pt with SO to target improving vocal quality and increasing intensity through progressively difficulty speech tasks.ST lead pt through exercises providing singular model prior to pt execution with occasional mod-A required to achieve target dB this date. Averages this date:  Sustained "ah" average 88 dB Glides: 88 dB cue to open mouth for first production Reading: 78 dB - with occasional mod cues for slowing rate and incr intention with speech. He Id'd and attempted self correction with 70% of rushes of speech Cognitive Task (multiple meaning words): Occasional mod cues for reducing rate Conversational sample between tasks was completed with pt demonstrating usual rushes of speech with usual mod A for deliberate speech. Pt described "s" statement re: situation yesterday when he returned from working out. SLP strongly suggested pt take 20-30 minute nap when he returns fatigued from a workout, or another task. Pt demonstrated understanding.   10/03/23: Pt took PD med approx 0900.  Today SLP assisted pt with SO to target improving vocal quality and increasing intensity through progressively difficulty speech tasks using Speak Out! Program lesson 16. ST lead pt through exercises providing singular model prior to pt execution with occasional mod-A required to achieve target dB this date. Averages this date:   Sustained "ah" 88 dB Glides: 87 dB cue to open mouth and glide instead of stair step reading 87dB - with usual mod cues for slowing rate and incr intention with speech. He Id'd and attempted self correction with 80% of rushes of speech Conversational sample between tasks pt averages rushes of speech with occasional min-mod A for deliberate speech.  09/28/23: Pt provided CPIB today. See above for scoring details.  Today SLP assisted pt with SO to target improving vocal quality and increasing intensity through progressively difficulty speech tasks using Speak Out! Program lesson 13. ST leads pt through exercises providing singular model prior to pt execution with occasional mod-A required to achieve target dB this date. Averages this date:  Sustained "ah" 90 dB reading 87dB cognitive speech task 73 dB.  Conversational sample of approx 2 minutes, pt averages rushes of speech with rare mod-A for deliberate speech.   10/8/24[name] will need CPIB next session. Swallow: SLP taught pt about Mendelsohn. Pt performed x7 reps with fading cues. By 7th rep pt was counting his own 5-second hold appropriately. SLP and pt discussed his swallowing needs to be purposeful, based on the  results of his MBS, to be more normalized (timing of vestibule closure is late - SLP postulates that pt is rushing the bolus into the pharynx.The plan is for pt to be more deliberate in his swallowing.  Speech: Pt is only on lesson 10 in SO book. SLP strongly reiterated consistency is the key to lasting change with his speech. SLP educated [name] today while practicing with SO practice online. SLP provided min A occasionally for slowing rate and exhibiting deliberate speech  09/20/23: Next session SLP will need to teach pt Clair Gulling as a prophylactic swallowing exercise.  Today pt entered stating he is only doing Chief Strategy Officer. SLP reiterated that pt is to complete SO lesson/day and do an online session. He and wife watched  introductory webinar again and [name] said, "It was good to see it again."  Target improving vocal quality and increasing intensity through progressively difficulty speech tasks using Speak Out! program, lesson 2. ST lead pt through exercises providing occasional model prior to pt execution for glides. rare min-A required to achieve target dB this date. Averages this date:  Sustained "ah" 95 dB; SLP suggested pt reduce volume and pt did so with 90dB average reading 85dB average and occasional min-mod cues for intent to reduce speech rate Conversational sample of approx 5 minutes, pt averages 70 dB with occasional mod-A for deliberate speech production for speech rate reduction.  09/13/23 (eval): Evaluation results, recommendations for skilled ST. SLP re-trained pt on SO using Lesson 4, which pt had difficulty with using intentional speech on the reading portion (poem stanzas). SLP told pt to start with Lesson 4, and to go online and participate in online session on [email or web address]. He agreed. SLP also told pt homework was for he and Tonia to watch "what is Parkinson's" 60-minute webinar prior to next session.   PATIENT EDUCATION: Education details: see "today's treatment" above Person educated: Patient Education method: Explanation Education comprehension: verbalized understanding   HOME EXERCISE PROGRAM: Speak Out, beginning with Lesson 4 and completing until Lesson 24 with one lesson/day.   GOALS: Goals reviewed with patient? No  SHORT TERM GOALS: Target date: 10/13/23    Pt will maintain WNL rate of speech during structured practice (oral reading, cognitive exercise, etc.), multi-sentence level, with occasional min-A over 2 sessions.  Baseline: Goal status: Not met  2.  Pt will self-ID rushes in speech, as they occur, in 50% of opportunities to increase ability to self-correct rate with occasional min-A over 2 sessions.  Baseline:  Goal status: Not met  3.  Pt will report  completion of dysarthria HEP at least 1x/day over 1 week period with mod-I Baseline:  Goal status: Not met  4. Pt will complete safe swallow strategies with modified independence in 2 sessions  Baseline:  Goal status: Deferred to work on speech  5. Pt will complete dysphagia HEP with modified independence in 2 sessions  Baseline:  Goal status: Deferred to work on speech  LONG TERM GOALS: Target date: 11/10/23    Pt will demonstrate average WNL rate of speech over 18+ minute conversational sample with no more than 4 rushes of speech over sample period, with rare min-A.  Baseline:  Goal status: INITIAL  2.  Pt will ID and self-correct rushes in rate of speech in 70% of occurences in structured speech practice (e.g. oral reading) over 2 sessions with rare min-A.  Baseline:  Goal status: INITIAL  3.  Pt will report improvement via CPIB PROM by 4 points by d/c,  indicative subjective improvement in communicative abilities.  Baseline:  Goal status: INITIAL  ASSESSMENT:  CLINICAL IMPRESSION: Patient is a 62 y.o. M who was seen today for dysarthria in light of Parkinson's disease. See "today's treatment" for more details. [name] told SLP at eval that he avoids some speaking situations at this time due to his speech, which he confirmed he still does during the session today.  OBJECTIVE IMPAIRMENTS  Objective impairments include dysarthria. These impairments are limiting patient from effectively communicating at home and in community.Factors affecting potential to achieve goals and functional outcome are medical prognosis.. Patient will benefit from skilled SLP services to address above impairments and improve overall function.  REHAB POTENTIAL: Good  PLAN: SLP FREQUENCY: 2x/week  SLP DURATION: 8 weeks  PLANNED INTERVENTIONS: Aspiration precaution training, Pharyngeal strengthening exercises, Diet toleration management , Internal/external aids, SLP instruction and feedback, Compensatory  strategies, and Patient/family education   [facility], CCC-SLP 10/24/2023, 9:40 AM

## 2023-10-25 NOTE — Progress Notes (Unsigned)
  [name] [address] [place] [address] Phone: [phone] Subjective:   Ian [name], am serving as a scribe for Dr. Antoine Primas.  I'm seeing this patient by the request  of:  Tracey Harries, MD  CC: Back and neck pain follow-up  IHK:[id number]  Ian [name] is a 62 y.o. male coming in with complaint of back and neck pain. OMT 09/14/2023. Patient states same per usual. No new concerns.  Medications patient has been prescribed: None  Taking:         Reviewed prior external information including notes and imaging from previsou exam, outside providers and external EMR if available.   As well as notes that were available from care everywhere and other healthcare systems.  Past medical history, social, surgical and family history all reviewed in electronic medical record.  No pertanent information unless stated regarding to the chief complaint.   Past Medical History:  Diagnosis Date   Anxiety    History of kidney stones    passed   PD (Parkinson's disease) (HCC) 09/10/2013   PONV (postoperative nausea and vomiting)    nausea     No Known Allergies   Review of Systems:  No headache, visual changes, nausea, vomiting, diarrhea, constipation, dizziness, abdominal pain, skin rash, fevers, chills, night sweats, weight loss, swollen lymph nodes, body aches, joint swelling, chest pain, shortness of breath, mood changes. POSITIVE muscle aches  Objective  Blood pressure 112/74, pulse 93, height 6\' 3"  (1.905 m), weight 170 lb (77.1 kg), SpO2 98%.   General: No apparent distress alert and oriented x3 mood and affect normal, dressed appropriately.  HEENT: Pupils equal, extraocular movements intact  Respiratory: Patient's speak in full sentences and does not appear short of breath  Cardiovascular: No lower extremity edema, non tender, no erythema  Gait MSK:  Back does have some loss lordosis noted.  Still has tightness noted with some  mild rigidity.  Seem to be tighter in the neck than usual though.  Patient does have some limited sidebending bilaterally.  Osteopathic findings  C2 flexed rotated and side bent right C5 flexed rotated and side bent left T3 extended rotated and side bent right inhaled rib T9 extended rotated and side bent left L2 flexed rotated and side bent right Sacrum right on right       Assessment and Plan:  No problem-specific Assessment & Plan notes found for this encounter.    Nonallopathic problems  Decision today to treat with OMT was based on Physical Exam  After verbal consent patient was treated with HVLA, ME, FPR techniques in cervical, rib, thoracic, lumbar, and sacral  areas  Patient tolerated the procedure well with improvement in symptoms  Patient given exercises, stretches and lifestyle modifications  See medications in patient instructions if given  Patient will follow up in 4-8 weeks     The above documentation has been reviewed and is accurate and complete Judi Saa, DO         Note: This dictation was prepared with Dragon dictation along with smaller phrase technology. Any transcriptional errors that result from this process are unintentional.

## 2023-10-26 ENCOUNTER — Encounter: Payer: Self-pay | Admitting: Family Medicine

## 2023-10-26 ENCOUNTER — Ambulatory Visit: Payer: Medicare Other | Admitting: Family Medicine

## 2023-10-26 VITALS — BP 112/74 | HR 93 | Ht 75.0 in | Wt 170.0 lb

## 2023-10-26 DIAGNOSIS — M9902 Segmental and somatic dysfunction of thoracic region: Secondary | ICD-10-CM

## 2023-10-26 DIAGNOSIS — M9901 Segmental and somatic dysfunction of cervical region: Secondary | ICD-10-CM

## 2023-10-26 DIAGNOSIS — M9904 Segmental and somatic dysfunction of sacral region: Secondary | ICD-10-CM | POA: Diagnosis not present

## 2023-10-26 DIAGNOSIS — M9903 Segmental and somatic dysfunction of lumbar region: Secondary | ICD-10-CM

## 2023-10-26 DIAGNOSIS — M24551 Contracture, right hip: Secondary | ICD-10-CM | POA: Diagnosis not present

## 2023-10-26 DIAGNOSIS — M9908 Segmental and somatic dysfunction of rib cage: Secondary | ICD-10-CM | POA: Diagnosis not present

## 2023-10-26 NOTE — Patient Instructions (Signed)
Good to see you! GO enjoy that [place] See you again in 7-8 weeks

## 2023-10-26 NOTE — Assessment & Plan Note (Signed)
Continue tightness causing more discomfort in the back as well.  The patient has been driving somewhat more.  No significant other changes.  Increase activity slowly otherwise.  Follow-up again with me in 6 to 8 weeks.

## 2023-10-31 ENCOUNTER — Ambulatory Visit: Payer: Medicare Other

## 2023-10-31 DIAGNOSIS — R471 Dysarthria and anarthria: Secondary | ICD-10-CM

## 2023-10-31 DIAGNOSIS — R1310 Dysphagia, unspecified: Secondary | ICD-10-CM

## 2023-10-31 NOTE — Therapy (Unsigned)
OUTPATIENT SPEECH LANGUAGE PATHOLOGY PARKINSON'S TREATMENT/Progress note   Patient Name: Ian [name] MRN: [id number] DOB:1961-07-22, 62 y.o., male Today's Date: 11/01/2023  PCP: Tracey Harries, MD REFERRING PROVIDER: Vladimir Faster, DO   End of Session - 11/01/23 0844     Visit Number 10    Number of Visits 17    Date for SLP Re-Evaluation 11/11/23    SLP Start Time 0935    SLP Stop Time  1015    SLP Time Calculation (min) 40 min    Activity Tolerance Patient tolerated treatment well                 Past Medical History:  Diagnosis Date   Anxiety    History of kidney stones    passed   PD (Parkinson's disease) (HCC) 09/10/2013   PONV (postoperative nausea and vomiting)    nausea    Past Surgical History:  Procedure Laterality Date   COLONOSCOPY     MINOR PLACEMENT OF FIDUCIAL N/A 01/31/2017   Procedure: MINOR PLACEMENT OF FIDUCIAL;  Surgeon: Maeola Harman, MD;  Location: [facility] OR;  Service: Neurosurgery;  Laterality: N/A;  Fiducial placement   none     PULSE GENERATOR IMPLANT Right 02/17/2017   Procedure: Right implantable pulse generator placement;  Surgeon: Maeola Harman, MD;  Location: [facility] OR;  Service: Neurosurgery;  Laterality: Right;  Bilateral implantable pulse generator placement   SUBTHALAMIC STIMULATOR BATTERY REPLACEMENT N/A 10/19/2018   Procedure: Deep Brain stimulator implantable pulse generator revision;  Surgeon: Maeola Harman, MD;  Location: [facility] OR;  Service: Neurosurgery;  Laterality: N/A;  Deep Brain stimulator implantable pulse generator revision   SUBTHALAMIC STIMULATOR INSERTION Bilateral 02/10/2017   Procedure: Bilateral Deep brain stimulator placement;  Surgeon: Maeola Harman, MD;  Location: [facility] OR;  Service: Neurosurgery;  Laterality: Bilateral;  Bilateral Deep brain stimulator placement   UPPER GASTROINTESTINAL ENDOSCOPY     Patient Active Problem List   Diagnosis Date Noted   Hip flexor tendon tightness 09/20/2022   Closed fracture of phalanx of  left fifth toe 10/19/2021   Tinea versicolor 05/28/2021   Left rotator cuff tear 08/20/2020   Nonallopathic lesion of sacral region 07/25/2018   Nonallopathic lesion of lumbosacral region 07/25/2018   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   Acute bursitis of right shoulder 12/06/2017   Antalgic gait 01/24/2017   Intercostal muscle tear 07/19/2016   Parkinson's disease with dyskinesia and fluctuating manifestations (HCC) 06/24/2016   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  Objective: See below.  Goal Update: See below.  Plan: See below.  Reason Skilled Services are Required: Pt has not met LTGs and is improving skills in multi-sentence responses.   ONSET DATE: dx Parkinson's 2014, script 08/31/23  REFERRING DIAG: G20 (ICD-10-CM) - Parkinson's disease (HCC)  THERAPY DIAG:  Dysarthria and anarthria  Dysphagia, unspecified type  SUBJECTIVE:   SUBJECTIVE STATEMENT:  "I've been practicing but I need some accountability." Pt took PD meds at 0930  Pt accompanied by: self  PERTINENT HISTORY: Dx with PD Fall 2014, symptoms began 2012. Primary concerns are lethargy,  anxiety, fast rate of speech. Had speech screen 08/31/23 and ST was recommended (see "diagnostic findings" below), along with MBS. MBS scheduled 09/19/23.  PAIN:  Are you having pain? No  FALLS: Has patient fallen in last 6 months?  No  DIAGNOSTIC FINDINGS: Encounter Date: 08/31/2023 Speech Therapy Parkinson's Disease Screen       Decibel Level today: low  70s dB  (WNL=70-72 dB) with sound level meter 30cm away from pt's mouth. Pt's conversational volume has maintained since last treatment course. Primary characteristic of pt's speech is increased rate which decreased intelligibility. Pt endorses this has worsened since last therapy course.   Pt does report difficulty with swallowing, which does warrant further evaluation. Recommend Modified Barium Swallow Study, ST will place order.  Pt reports challenges with word finding and organization related   Pt would benefit from speech-language eval for dysarthria, bedside swallow, and cognition- please order via EPIC or call [phone] to schedule  MBS 09/19/23 HPI/PMH: HPI: PHMx PD with DBS, followed by Dr. Arbutus Leas, currently being seen by OP ST to address dysarthria. Pt c/o occasional coughing with liquids and sensation of pills sticking. Symptoms have increased over last 3-4 mos per pt report. Clinical Impression: Clinical Impression: Pt presents with overall functional oropharyngeal swallow. Mild deficits observed with laryngeal elevation and delay observed with epiglottic inversion and laryngeal vestibule closure with thin liquids. Laryngeal vestibule observed to achieve full closure after swallow initiation, which allows barium to coat underside of epiglottis. No aspiration or penetration was observed across administered consistencies. Mild pharyngeal residue noted in valleculae and pyriform sinus which clears with cues dry swallow. SLP provided education on recommendations for use of intentional swallow, routine oral care, continuation of regular diet with thin liquids. Advised pt to monitor symptoms and let neurologist, PCP, or primary SLP know of worsening symptoms, as swallow deficits are common with PD. Pt verbalizes understanding, plans to f/u with treating SLP.   PATIENT GOALS : "to get this speech more consistent"  OBJECTIVE:    PATIENT REPORTED OUTCOME MEASURES (PROM): Communication  Participation Item Bank: (CPIB) provided 09/28/23. Pt scored himself 6/40, with greatest difficulty as in the picture below:    TODAY'S TREATMENT:  Speak out=SO 10/31/23: SLP and pt discussed/SLP educated pt about compensatory measures to maximize speech fluidity. Ian [name] told SLP his difficulty with psychological challenges to remain fluent paired with intent which are different than the challenge to be intentional which leads to incr'd fluent speech. Pt acknowledged his DBS makes speaking at a slower rate more difficult. SLP and pt came to the conclusion pt must practice at least twice a day 15 minutes each, or for 25 minutes, in addition to an online session with Ian [name]. Today pt performed m-words, /a/, and glides with 90-95% success with intentional speech. He read an 8-paragraph (one page) selection with occasional min-mod A for intent to reduce speech rate. SLP also reiterated that pt's DBS may make pt's ability to slow his rate in the BEST of circumstances difficult; SLP encouraged pt to check with his MD for more information.   10/24/23: Meds at 0900. Better speech rate today than 10/19/23 upon entering ST room. Has been more faithful at practicing the cognitive portions of the SO lessons since last session. Today SLP had pt warm up with some spelling divergent naming task. Pt was also assisted in two sentence responses to incr intent and purpose in his speech in order to slow speech rate. 50% success until pt began to chunk words in his  sentences, then success incr'd to approx 80%. SLP strongly encouraged pt to use this strategy in his practice sessions and as he speaks with friends and family.   10/19/23: Meds today at 0845. Pt arrived again with very fast speech with decr'd intelligibility. Given pt's past comments about his (negative) feelings about his speech, SLP inquired about his neurological vs psychological effects of his speech. SLP ascertained that certain conversations and/or pt's  perception of certain conversations make it more difficult for him to focus on intent, being deliberate, and mindful. Today SLP encouraged pt to think about what about these situations make it more challenging for him and think about how those feelings could be mitigated/eliminated.  Pt practiced deliberate speech with 2-3 sentence responses with self correction and rare min A for intent. SLP stressed pt needs to practice at this level for improved success with deliberate speech which should reduce pt's rate to approximate a more normal rate. SLP told pt to cont to practice at this level until next session.  10/17/23: Took meds at 0830, will take again at 1300. Pt has completed SO Lesson 24 since last session. He has practiced using online practice or practice in the car while away from home this weekend. Pt endorsed being more stressed today and the more he and SLP discussed his speech the more dysfluent he became. SLP guided pt through Lesson 20 which pt stated would be the last lesson that had a reading section which he felt he had at least 80% intention/deliberate speech.  Sustained "ah" average 87 dB Glides: 86 dB cue to open mouth for first production Reading: 76 dB - with occasional min cues for incr intent with speech. He Id'd and attempted self correction in one instance of rushed speech SLP and pt also discussed pt's question of DBS making speech worse, which he had seen online. SLP encouraged pt to reach out to his neurologist and surgeon, as this can occur, but SLP was unsure of details of his system to provide him with more information. Pt stated he felt he needed to be more deliberate about practicing throughout the day so SLP suggested picking at least two 30-minute time periods to put intense focus on deliberate intentional speech. Pt agreed this would be helpful for him.   10/10/23: Took meds at 0900. Pt is on Lesson 22 of SO today. Today SLP assisted pt with SO to target improving vocal  quality and increasing intensity through progressively difficulty speech tasks.ST lead pt through exercises providing singular model prior to pt execution with occasional mod-A required to achieve target dB this date. Averages this date:  Sustained "ah" average 88 dB Glides: 88 dB cue to open mouth for first production Reading: 78 dB - with occasional mod cues for slowing rate and incr intention with speech. He Id'd and attempted self correction with 70% of rushes of speech Cognitive Task (multiple meaning words): Occasional mod cues for reducing rate Conversational sample between tasks was completed with pt demonstrating usual rushes of speech with usual mod A for deliberate speech. Pt described "s" statement re: situation yesterday when he returned from working out. SLP strongly suggested pt take 20-30 minute nap when he returns fatigued from a workout, or another task. Pt demonstrated understanding.   10/03/23: Pt took PD med approx 0900.  Today SLP assisted pt with SO to target improving vocal quality and increasing intensity through progressively difficulty speech tasks using Speak Out! Program lesson 16. ST lead pt through exercises providing singular  model prior to pt execution with occasional mod-A required to achieve target dB this date. Averages this date:  Sustained "ah" 88 dB Glides: 87 dB cue to open mouth and glide instead of stair step reading 87dB - with usual mod cues for slowing rate and incr intention with speech. He Id'd and attempted self correction with 80% of rushes of speech Conversational sample between tasks pt averages rushes of speech with occasional min-mod A for deliberate speech.  09/28/23: Pt provided CPIB today. See above for scoring details.  Today SLP assisted pt with SO to target improving vocal quality and increasing intensity through progressively difficulty speech tasks using Speak Out! Program lesson 13. ST leads pt through exercises providing singular model  prior to pt execution with occasional mod-A required to achieve target dB this date. Averages this date:  Sustained "ah" 90 dB reading 87dB cognitive speech task 73 dB.  Conversational sample of approx 2 minutes, pt averages rushes of speech with rare mod-A for deliberate speech.   10/8/24Lovie [name] will need CPIB next session. Swallow: SLP taught pt about Mendelsohn. Pt performed x7 reps with fading cues. By 7th rep pt was counting his own 5-second hold appropriately. SLP and pt discussed his swallowing needs to be purposeful, based on the results of his MBS, to be more normalized (timing of vestibule closure is late - SLP postulates that pt is rushing the bolus into the pharynx.The plan is for pt to be more deliberate in his swallowing.  Speech: Pt is only on lesson 10 in SO book. SLP strongly reiterated consistency is the key to lasting change with his speech. SLP educated Ian [name] today while practicing with SO practice online. SLP provided min A occasionally for slowing rate and exhibiting deliberate speech  09/20/23: Next session SLP will need to teach pt Ian [name] as a prophylactic swallowing exercise.  Today pt entered stating he is only doing Chief Strategy Officer. SLP reiterated that pt is to complete SO lesson/day and do an online session. He and wife watched introductory webinar again and Ian [name] said, "It was good to see it again."  Target improving vocal quality and increasing intensity through progressively difficulty speech tasks using Speak Out! program, lesson 2. ST lead pt through exercises providing occasional model prior to pt execution for glides. rare min-A required to achieve target dB this date. Averages this date:  Sustained "ah" 95 dB; SLP suggested pt reduce volume and pt did so with 90dB average reading 85dB average and occasional min-mod cues for intent to reduce speech rate Conversational sample of approx 5 minutes, pt averages 70 dB with occasional mod-A for deliberate speech  production for speech rate reduction.  09/13/23 (eval): Evaluation results, recommendations for skilled ST. SLP re-trained pt on SO using Lesson 4, which pt had difficulty with using intentional speech on the reading portion (poem stanzas). SLP told pt to start with Lesson 4, and to go online and participate in online session on [email or web address]. He agreed. SLP also told pt homework was for he and Ian [name] to watch "what is Parkinson's" 60-minute webinar prior to next session.   PATIENT EDUCATION: Education details: see "today's treatment" above Person educated: Patient Education method: Explanation Education comprehension: verbalized understanding   HOME EXERCISE PROGRAM: Speak Out, beginning with Lesson 4 and completing until Lesson 24 with one lesson/day.   GOALS: Goals reviewed with patient? No  SHORT TERM GOALS: Target date: 10/13/23    Pt will maintain WNL rate of speech during structured practice (oral reading, cognitive  exercise, etc.), multi-sentence level, with occasional min-A over 2 sessions.  Baseline: Goal status: Not met  2.  Pt will self-ID rushes in speech, as they occur, in 50% of opportunities to increase ability to self-correct rate with occasional min-A over 2 sessions.  Baseline:  Goal status: Not met  3.  Pt will report completion of dysarthria HEP at least 1x/day over 1 week period with mod-I Baseline:  Goal status: Not met  4. Pt will complete safe swallow strategies with modified independence in 2 sessions  Baseline:  Goal status: Deferred to work on speech  5. Pt will complete dysphagia HEP with modified independence in 2 sessions  Baseline:  Goal status: Deferred to work on speech  LONG TERM GOALS: Target date: 11/10/23    Pt will demonstrate average WNL rate of speech over 18+ minute conversational sample with no more than 4 rushes of speech over sample period, with rare min-A.  Baseline:  Goal status: INITIAL  2.  Pt will ID and  self-correct rushes in rate of speech in 70% of occurences in structured speech practice (e.g. oral reading) over 2 sessions with rare min-A.  Baseline:  Goal status: INITIAL  3.  Pt will report improvement via CPIB PROM by 4 points by d/c, indicative subjective improvement in communicative abilities.  Baseline:  Goal status: INITIAL  ASSESSMENT:  CLINICAL IMPRESSION: Patient is a 63 y.o. M who was seen today for dysarthria in light of Parkinson's disease. See "today's treatment" for more details. Ian [name] told SLP at eval that he continues to avoid some speaking situations at this time due to his speech, which he confirmed he still does during the session today.  OBJECTIVE IMPAIRMENTS  Objective impairments include dysarthria. These impairments are limiting patient from effectively communicating at home and in community.Factors affecting potential to achieve goals and functional outcome are medical prognosis.. Patient will benefit from skilled SLP services to address above impairments and improve overall function.  REHAB POTENTIAL: Good  PLAN: SLP FREQUENCY: 2x/week  SLP DURATION: 8 weeks  PLANNED INTERVENTIONS: Aspiration precaution training, Pharyngeal strengthening exercises, Diet toleration management , Internal/external aids, SLP instruction and feedback, Compensatory strategies, and Patient/family education   [facility], CCC-SLP 11/01/2023, 8:45 AM

## 2023-11-02 ENCOUNTER — Ambulatory Visit: Payer: Medicare Other

## 2023-11-06 ENCOUNTER — Encounter: Payer: Self-pay | Admitting: Neurology

## 2023-11-06 MED ORDER — CLONAZEPAM 0.5 MG PO TABS: ORAL_TABLET | ORAL | 1 refills | Status: DC

## 2023-11-07 ENCOUNTER — Ambulatory Visit: Payer: Medicare Other

## 2023-11-07 DIAGNOSIS — R471 Dysarthria and anarthria: Secondary | ICD-10-CM

## 2023-11-07 NOTE — Therapy (Signed)
OUTPATIENT SPEECH LANGUAGE PATHOLOGY PARKINSON'S TREATMENT  Patient Name: Ian [name] MRN: [id number] DOB:02-03-1961, 62 y.o., male Today's Date: 11/07/2023  PCP: Tracey Harries, MD REFERRING PROVIDER: Vladimir Faster, DO   End of Session - 11/07/23 0943     Visit Number 11    Number of Visits 17    Date for SLP Re-Evaluation 11/11/23    SLP Start Time 0937    SLP Stop Time  1015    SLP Time Calculation (min) 38 min    Activity Tolerance Patient tolerated treatment well                 Past Medical History:  Diagnosis Date   Anxiety    History of kidney stones    passed   PD (Parkinson's disease) (HCC) 09/10/2013   PONV (postoperative nausea and vomiting)    nausea    Past Surgical History:  Procedure Laterality Date   COLONOSCOPY     MINOR PLACEMENT OF FIDUCIAL N/A 01/31/2017   Procedure: MINOR PLACEMENT OF FIDUCIAL;  Surgeon: Maeola Harman, MD;  Location: [facility] OR;  Service: Neurosurgery;  Laterality: N/A;  Fiducial placement   none     PULSE GENERATOR IMPLANT Right 02/17/2017   Procedure: Right implantable pulse generator placement;  Surgeon: Maeola Harman, MD;  Location: [facility] OR;  Service: Neurosurgery;  Laterality: Right;  Bilateral implantable pulse generator placement   SUBTHALAMIC STIMULATOR BATTERY REPLACEMENT N/A 10/19/2018   Procedure: Deep Brain stimulator implantable pulse generator revision;  Surgeon: Maeola Harman, MD;  Location: [facility] OR;  Service: Neurosurgery;  Laterality: N/A;  Deep Brain stimulator implantable pulse generator revision   SUBTHALAMIC STIMULATOR INSERTION Bilateral 02/10/2017   Procedure: Bilateral Deep brain stimulator placement;  Surgeon: Maeola Harman, MD;  Location: [facility] OR;  Service: Neurosurgery;  Laterality: Bilateral;  Bilateral Deep brain stimulator placement   UPPER GASTROINTESTINAL ENDOSCOPY     Patient Active Problem List   Diagnosis Date Noted   Hip flexor tendon tightness 09/20/2022   Closed fracture of phalanx of left fifth toe  10/19/2021   Tinea versicolor 05/28/2021   Left rotator cuff tear 08/20/2020   Nonallopathic lesion of sacral region 07/25/2018   Nonallopathic lesion of lumbosacral region 07/25/2018   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   Acute bursitis of right shoulder 12/06/2017   Antalgic gait 01/24/2017   Intercostal muscle tear 07/19/2016   Parkinson's disease with dyskinesia and fluctuating manifestations (HCC) 06/24/2016   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   Nonallopathic lesion of thoracic region 01/25/2016   Nonallopathic lesion-rib cage 01/25/2016   Nonallopathic lesion of cervical region 01/25/2016   Parkinson's disease (HCC) 01/25/2016   Lateral epicondylitis 12/01/2014   REM behavioral disorder 08/27/2014   Paralysis agitans (HCC) 10/18/2013    ONSET DATE: dx Parkinson's 2014, script 08/31/23  REFERRING DIAG: G20 (ICD-10-CM) - Parkinson's disease (HCC)  THERAPY DIAG:  Dysarthria and anarthria  SUBJECTIVE:   SUBJECTIVE STATEMENT:  "I think some of it was in my head." Pt took PD meds at 0900  Pt accompanied by: self  PERTINENT HISTORY: Dx with PD Fall 2014, symptoms began 2012. Primary concerns are lethargy, anxiety, fast rate of speech. Had speech screen 08/31/23 and ST was recommended (see "diagnostic findings" below), along with MBS. MBS scheduled 09/19/23.  PAIN:  Are you having pain? No  FALLS: Has patient fallen in last 6 months?  No  DIAGNOSTIC FINDINGS: Encounter Date: 08/31/2023 Speech Therapy Parkinson's Disease Screen       Decibel Level today: low 70s dB  (  WNL=70-72 dB) with sound level meter 30cm away from pt's mouth. Pt's conversational volume has maintained since last treatment course. Primary characteristic of pt's speech is increased rate which decreased intelligibility. Pt endorses this has worsened since last therapy course.   Pt does report difficulty with swallowing, which does warrant further evaluation. Recommend Modified Barium Swallow Study, ST  will place order.  Pt reports challenges with word finding and organization related   Pt would benefit from speech-language eval for dysarthria, bedside swallow, and cognition- please order via EPIC or call [phone] to schedule  MBS 09/19/23 HPI/PMH: HPI: PHMx PD with DBS, followed by Dr. Arbutus Leas, currently being seen by OP ST to address dysarthria. Pt c/o occasional coughing with liquids and sensation of pills sticking. Symptoms have increased over last 3-4 mos per pt report. Clinical Impression: Clinical Impression: Pt presents with overall functional oropharyngeal swallow. Mild deficits observed with laryngeal elevation and delay observed with epiglottic inversion and laryngeal vestibule closure with thin liquids. Laryngeal vestibule observed to achieve full closure after swallow initiation, which allows barium to coat underside of epiglottis. No aspiration or penetration was observed across administered consistencies. Mild pharyngeal residue noted in valleculae and pyriform sinus which clears with cues dry swallow. SLP provided education on recommendations for use of intentional swallow, routine oral care, continuation of regular diet with thin liquids. Advised pt to monitor symptoms and let neurologist, PCP, or primary SLP know of worsening symptoms, as swallow deficits are common with PD. Pt verbalizes understanding, plans to f/u with treating SLP.   PATIENT GOALS : "to get this speech more consistent"  OBJECTIVE:    PATIENT REPORTED OUTCOME MEASURES (PROM): Communication Participation Item Bank: (CPIB) provided 09/28/23. Pt scored himself 6/40, with greatest difficulty as in the picture below:    TODAY'S TREATMENT:  Speak out=SO 11/07/23: Pt reports he is happy with the way his church meeting went with his speech, but had a fraternity meeting Sunday afternoon which did not go as well - stuttered. "I think some of it was in my head." SLP spent the sessionexploring pt's psychological effects  of his speech on his ability to reduce his rate to improve intelligibility. He says he, negatively, thinks about/worries about not stuttering and not speaking fast so that people can't understand him. He agrees with SLP that these thoughts and feelings detract from the mental energy and ability that he has to think more about intentionality, deliberateness, mindfulness about, positively, controlling his speech.  10/31/23: SLP and pt discussed/SLP educated pt about compensatory measures to maximize speech fluidity. [name] told SLP his difficulty with psychological challenges to remain fluent paired with intent which are different than the challenge to be intentional which leads to incr'd fluent speech. Pt acknowledged his DBS makes speaking at a slower rate more difficult. SLP and pt came to the conclusion pt must practice at least twice a day 15 minutes each, or for 25 minutes, in addition to an online session with Lelon Mast. Today pt performed m-words, /a/, and glides with 90-95% success with intentional speech. He read an 8-paragraph (one page) selection with occasional min-mod A for intent to reduce speech rate. SLP also reiterated that pt's DBS may make pt's ability to slow his rate in the BEST of circumstances difficult; SLP encouraged pt to check with his MD for more information.   10/24/23: Meds at 0900. Better speech rate today than 10/19/23 upon entering ST room. Has been more faithful at practicing the cognitive portions of the SO lessons since last  session. Today SLP had pt warm up with some spelling divergent naming task. Pt was also assisted in two sentence responses to incr intent and purpose in his speech in order to slow speech rate. 50% success until pt began to chunk words in his sentences, then success incr'd to approx 80%. SLP strongly encouraged pt to use this strategy in his practice sessions and as he speaks with friends and family.   10/19/23: Meds today at 0845. Pt arrived again with  very fast speech with decr'd intelligibility. Given pt's past comments about his (negative) feelings about his speech, SLP inquired about his neurological vs psychological effects of his speech. SLP ascertained that certain conversations and/or pt's perception of certain conversations make it more difficult for him to focus on intent, being deliberate, and mindful. Today SLP encouraged pt to think about what about these situations make it more challenging for him and think about how those feelings could be mitigated/eliminated.  Pt practiced deliberate speech with 2-3 sentence responses with self correction and rare min A for intent. SLP stressed pt needs to practice at this level for improved success with deliberate speech which should reduce pt's rate to approximate a more normal rate. SLP told pt to cont to practice at this level until next session.  10/17/23: Took meds at 0830, will take again at 1300. Pt has completed SO Lesson 24 since last session. He has practiced using online practice or practice in the car while away from home this weekend. Pt endorsed being more stressed today and the more he and SLP discussed his speech the more dysfluent he became. SLP guided pt through Lesson 20 which pt stated would be the last lesson that had a reading section which he felt he had at least 80% intention/deliberate speech.  Sustained "ah" average 87 dB Glides: 86 dB cue to open mouth for first production Reading: 76 dB - with occasional min cues for incr intent with speech. He Id'd and attempted self correction in one instance of rushed speech SLP and pt also discussed pt's question of DBS making speech worse, which he had seen online. SLP encouraged pt to reach out to his neurologist and surgeon, as this can occur, but SLP was unsure of details of his system to provide him with more information. Pt stated he felt he needed to be more deliberate about practicing throughout the day so SLP suggested picking at  least two 30-minute time periods to put intense focus on deliberate intentional speech. Pt agreed this would be helpful for him.   10/10/23: Took meds at 0900. Pt is on Lesson 22 of SO today. Today SLP assisted pt with SO to target improving vocal quality and increasing intensity through progressively difficulty speech tasks.ST lead pt through exercises providing singular model prior to pt execution with occasional mod-A required to achieve target dB this date. Averages this date:  Sustained "ah" average 88 dB Glides: 88 dB cue to open mouth for first production Reading: 78 dB - with occasional mod cues for slowing rate and incr intention with speech. He Id'd and attempted self correction with 70% of rushes of speech Cognitive Task (multiple meaning words): Occasional mod cues for reducing rate Conversational sample between tasks was completed with pt demonstrating usual rushes of speech with usual mod A for deliberate speech. Pt described "s" statement re: situation yesterday when he returned from working out. SLP strongly suggested pt take 20-30 minute nap when he returns fatigued from a workout, or another task.  Pt demonstrated understanding.   10/03/23: Pt took PD med approx 0900.  Today SLP assisted pt with SO to target improving vocal quality and increasing intensity through progressively difficulty speech tasks using Speak Out! Program lesson 16. ST lead pt through exercises providing singular model prior to pt execution with occasional mod-A required to achieve target dB this date. Averages this date:  Sustained "ah" 88 dB Glides: 87 dB cue to open mouth and glide instead of stair step reading 87dB - with usual mod cues for slowing rate and incr intention with speech. He Id'd and attempted self correction with 80% of rushes of speech Conversational sample between tasks pt averages rushes of speech with occasional min-mod A for deliberate speech.  09/28/23: Pt provided CPIB today. See above  for scoring details.  Today SLP assisted pt with SO to target improving vocal quality and increasing intensity through progressively difficulty speech tasks using Speak Out! Program lesson 13. ST leads pt through exercises providing singular model prior to pt execution with occasional mod-A required to achieve target dB this date. Averages this date:  Sustained "ah" 90 dB reading 87dB cognitive speech task 73 dB.  Conversational sample of approx 2 minutes, pt averages rushes of speech with rare mod-A for deliberate speech.   10/8/24[name] will need CPIB next session. Swallow: SLP taught pt about Mendelsohn. Pt performed x7 reps with fading cues. By 7th rep pt was counting his own 5-second hold appropriately. SLP and pt discussed his swallowing needs to be purposeful, based on the results of his MBS, to be more normalized (timing of vestibule closure is late - SLP postulates that pt is rushing the bolus into the pharynx.The plan is for pt to be more deliberate in his swallowing.  Speech: Pt is only on lesson 10 in SO book. SLP strongly reiterated consistency is the key to lasting change with his speech. SLP educated [name] today while practicing with SO practice online. SLP provided min A occasionally for slowing rate and exhibiting deliberate speech  09/20/23: Next session SLP will need to teach pt Clair Gulling as a prophylactic swallowing exercise.  Today pt entered stating he is only doing Chief Strategy Officer. SLP reiterated that pt is to complete SO lesson/day and do an online session. He and wife watched introductory webinar again and [name] said, "It was good to see it again."  Target improving vocal quality and increasing intensity through progressively difficulty speech tasks using Speak Out! program, lesson 2. ST lead pt through exercises providing occasional model prior to pt execution for glides. rare min-A required to achieve target dB this date. Averages this date:  Sustained "ah" 95 dB; SLP  suggested pt reduce volume and pt did so with 90dB average reading 85dB average and occasional min-mod cues for intent to reduce speech rate Conversational sample of approx 5 minutes, pt averages 70 dB with occasional mod-A for deliberate speech production for speech rate reduction.  09/13/23 (eval): Evaluation results, recommendations for skilled ST. SLP re-trained pt on SO using Lesson 4, which pt had difficulty with using intentional speech on the reading portion (poem stanzas). SLP told pt to start with Lesson 4, and to go online and participate in online session on [email or web address]. He agreed. SLP also told pt homework was for he and Tonia to watch "what is Parkinson's" 60-minute webinar prior to next session.   PATIENT EDUCATION: Education details: see "today's treatment" above Person educated: Patient Education method: Explanation Education comprehension: verbalized understanding   HOME EXERCISE PROGRAM:  Speak Out, beginning with Lesson 4 and completing until Lesson 24 with one lesson/day.   GOALS: Goals reviewed with patient? No  SHORT TERM GOALS: Target date: 10/13/23    Pt will maintain WNL rate of speech during structured practice (oral reading, cognitive exercise, etc.), multi-sentence level, with occasional min-A over 2 sessions.  Baseline: Goal status: Not met  2.  Pt will self-ID rushes in speech, as they occur, in 50% of opportunities to increase ability to self-correct rate with occasional min-A over 2 sessions.  Baseline:  Goal status: Not met  3.  Pt will report completion of dysarthria HEP at least 1x/day over 1 week period with mod-I Baseline:  Goal status: Not met  4. Pt will complete safe swallow strategies with modified independence in 2 sessions  Baseline:  Goal status: Deferred to work on speech  5. Pt will complete dysphagia HEP with modified independence in 2 sessions  Baseline:  Goal status: Deferred to work on speech  LONG TERM GOALS:  Target date: 11/10/23    Pt will demonstrate average WNL rate of speech over 18+ minute conversational sample with no more than 4 rushes of speech over sample period, with rare min-A.  Baseline:  Goal status: INITIAL  2.  Pt will ID and self-correct rushes in rate of speech in 70% of occurences in structured speech practice (e.g. oral reading) over 2 sessions with rare min-A.  Baseline:  Goal status: INITIAL  3.  Pt will report improvement via CPIB PROM by 4 points by d/c, indicative subjective improvement in communicative abilities.  Baseline:  Goal status: INITIAL  ASSESSMENT:  CLINICAL IMPRESSION: Patient is a 62 y.o. M who was seen today for dysarthria in light of Parkinson's disease. See "today's treatment" for more details. [name] told SLP at eval that he continues to avoid some speaking situations at this time due to his speech, which he confirmed he still does during the session today.  OBJECTIVE IMPAIRMENTS  Objective impairments include dysarthria. These impairments are limiting patient from effectively communicating at home and in community.Factors affecting potential to achieve goals and functional outcome are medical prognosis.. Patient will benefit from skilled SLP services to address above impairments and improve overall function.  REHAB POTENTIAL: Good  PLAN: SLP FREQUENCY: 2x/week  SLP DURATION: 8 weeks  PLANNED INTERVENTIONS: Aspiration precaution training, Pharyngeal strengthening exercises, Diet toleration management , Internal/external aids, SLP instruction and feedback, Compensatory strategies, and Patient/family education   [facility], CCC-SLP 11/07/2023, 9:47 AM

## 2023-11-08 NOTE — Progress Notes (Signed)
Assessment/Plan:   1.  Parkinsons Disease  -The patient underwent bilateral STN DBS with [employer] device on 02/10/2017 and had IPG placement on 02/17/2017.    He did have his battery changed on October 19, 2018 to an MRI compatible battery.             -Samples of [name] were given, 145 mg.  We are struggling with the idea that when he is on and has dyskinesia, his speech is rapid and stuttering in quality, but when he is off, he has more fatigue.  He currently is taking [name], 95 mg, 2 tablets 4 times per day.  We decided to trial [name] 145 mg, 1 capsule 4 times per day.  This may, however, lead to more feeling of off time, which for him just means more fatigue.  If that is the case, he is going to try [name], 95 mg, 2 in the morning and rotate that with [name] 145 mg every other dose rytary 145 mg to get 4 dosages of [name] and per day.  -Continue amantadine.  He has backed down to twice per day, primarily because he backed down on [name] some.  He is doing fine.  -Asked about postoperative CT for visualization   2.  Fatigue              He exercises.  He has tried Nuvigil, Provigil, selegiline all without relief.  He finds that this seems to be one of his "off" symptoms.  3.  REM behavior disorder.             -On clonazepam 0.5 mg, half tablet at night.  PDMP has been reviewed.  No red flags.  This was just filled a few days ago. 4.  Anxiety  -His anxiety is really a very reasonable anxiety.  He has developed fear about ability to speak clearly because he has dysarthria and speaks sometimes with a rapid pace and stuttering quality and the anxiety will just make it much worse.  He wants to address this from all aspects, medication, speech therapy and behavioral therapy and I think it is really a reasonable approach.  I personally touched base with Dr. Dellia Cloud last week about this.  He touched base with me this morning and stated that he personally could counsel the patient if it  was at 7:30 AM and could do that every other week.  The patient is going to think about that and get back to me.  If the patient did not want to do a better early in the morning, then Dr. Dellia Cloud will find him someone else to help.  Subjective:   Ian [name] was seen today in follow up for Parkinsons disease.  My previous records were reviewed prior to todays visit as well as outside records available to me.  The patient's wife is on the phone and supplements the history.  Generally, the patient is doing fairly well from a Parkinson's standpoint.  However, speech issues, both stuttering and slurring and rapid pace have been a consistent issue.  He has been in speech therapy.  They suggested that it was the DBS contributing.  He emailed me about it earlier last week and I told him to shut off the DBS for an hour or 2 and see if his speech was better.  He was nervous about that.  I told him it was unlikely that it was the DBS since speech is deteriorating and I really have not changed  his DBS settings in a very long time.  He also expresses desire to see psychology, as he has developed anxiety and fear about ability to speak clearly and really wants to address this from all aspects, medication, behavioral and speech therapy.   He turned off the dbs for a few hours:  his wife felt that speech was slower and he was overall a bit slower but overall he was much better than they anticipated being off.  He does think that the dbs was causing speech to be rapid and thinks that meds do that as well.  He thinks that the dyskinesia and speech go together  Current prescribed movement disorder medications: [name] 95 mg, 2 tablets 3 times per day Clonazepam 0.5 mg, half tablet at night Amantadine, 100 mg 2 times per day  Provigil prn (once per week usually)   PREVIOUS MEDICATIONS:  Requip; Neupro (tried only a few days); propranolol helped tremor some; Inbrija (samples given but not taken); Provigil; Nuvigil;  selegiline (no help for fatigue); opicapone (increased dyskinesia); amantadine ; opicapone   ALLERGIES:  No Known Allergies  CURRENT MEDICATIONS:  Outpatient Encounter Medications as of 11/13/2023  Medication Sig   amantadine (SYMMETREL) 100 MG capsule Take 1 capsule (100 mg total) by mouth 2 (two) times daily.   Ascorbic Acid (VITAMIN C PO) Take 3 tablets by mouth 3 (three) times daily.    b complex vitamins tablet Take 1 tablet by mouth 2 (two) times daily.   Carbidopa-Levodopa ER ([name]) 23.75-95 MG CPCR TAKE 2 CAPSULES FOUR TIMES A DAY   Carbidopa-Levodopa ER ([name]) 23.75-95 MG CPCR TAKE 2 CAPSULES FOUR TIMES A DAY   clonazePAM (KLONOPIN) 0.5 MG tablet TAKE 1/2 TABLET(0.25 MG) BY MOUTH AT BEDTIME   ketoconazole (NIZORAL) 2 % cream Apply 1 application topically daily. To affected areas.   Melatonin 3 MG CAPS Take 3 mg by mouth at bedtime.   modafinil (PROVIGIL) 200 MG tablet Take 1 tablet (200 mg total) by mouth every morning.   terbinafine (LAMISIL) 1 % cream Apply 1 application topically 2 (two) times daily.   No facility-administered encounter medications on file as of 11/13/2023.    Objective:   PHYSICAL EXAMINATION:    VITALS:   Vitals:   11/13/23 0919  BP: 124/80  Pulse: 67  SpO2: 97%  Weight: 176 lb 9.6 oz (80.1 kg)  Height: 6\' 2"  (1.88 m)    GEN:  The patient appears stated age and is in NAD. HEENT:  Normocephalic, atraumatic.    Neurological examination:  Orientation: The patient is alert and oriented x3. Cranial nerves: There is good facial symmetry with minimal facial hypomimia.  Extraocular muscles are intact.  The speech is fluent and with intermittent dysarthria and stuttering.  Soft palate rises symmetrically and there is no tongue deviation. Hearing is intact to conversational tone. Sensation: Sensation is intact to light touch throughout Motor: Strength is at least antigravity x4  Movement examination: Tone: There is normal tone today. Abnormal  movements: There is very mild dyskinesia. Coordination:  There is no decremation with any form of RAMS, including alternating supination and pronation of the forearm, hand opening and closing, finger taps, heel taps and toe taps bilaterally. Gait and Station: The patient is able to ambulate well.  He has good stride length.  He has good arm swing today bilaterally.  He is able to turn easily and swiftly.  He has excess arm swing on the right  I have reviewed and interpreted the following  labs independently    Chemistry      Component Value Date/Time   NA 141 10/15/2018 1533   K 4.4 10/15/2018 1533   CL 105 10/15/2018 1533   CO2 28 10/15/2018 1533   BUN 16 10/15/2018 1533   CREATININE 1.05 10/15/2018 1533   CREATININE 0.91 11/25/2014 1051      Component Value Date/Time   CALCIUM 9.7 10/15/2018 1533   ALKPHOS 69 06/10/2015 1155   AST 27 06/10/2015 1155   ALT 40 06/10/2015 1155   BILITOT 1.0 06/10/2015 1155       Lab Results  Component Value Date   WBC 6.5 10/15/2018   HGB 15.4 10/15/2018   HCT 48.2 10/15/2018   MCV 91.6 10/15/2018   PLT 302 10/15/2018    No results found for: "TSH"  Total time spent on today's visit was 40 minutes, including both face-to-face time and nonface-to-face time.  Time included that spent on review of records (prior notes available to me/labs/imaging if pertinent), discussing treatment and goals, answering patient's questions and coordinating care.   Cc:  Tracey Harries, MD

## 2023-11-13 ENCOUNTER — Encounter: Payer: Self-pay | Admitting: Neurology

## 2023-11-13 ENCOUNTER — Ambulatory Visit (INDEPENDENT_AMBULATORY_CARE_PROVIDER_SITE_OTHER): Payer: Medicare Other | Admitting: Neurology

## 2023-11-13 VITALS — BP 124/80 | HR 67 | Ht 74.0 in | Wt 176.6 lb

## 2023-11-13 DIAGNOSIS — Z9689 Presence of other specified functional implants: Secondary | ICD-10-CM

## 2023-11-13 DIAGNOSIS — F411 Generalized anxiety disorder: Secondary | ICD-10-CM | POA: Diagnosis not present

## 2023-11-13 DIAGNOSIS — R5383 Other fatigue: Secondary | ICD-10-CM

## 2023-11-13 DIAGNOSIS — G20B2 Parkinson's disease with dyskinesia, with fluctuations: Secondary | ICD-10-CM | POA: Diagnosis not present

## 2023-11-13 NOTE — Procedures (Signed)
DBS Programming was performed.    Manufacturer of DBS device: [employer]  Total time spent programming was 12 minutes.  Device was confirmed to be on.  Soft start was confirmed to be on.  Impedences were checked and were within normal limits.  Battery was checked and was determined to be functioning normally and not near the end of life.  Final settings were as follows:    Active Contact Amplitude (mA) PW (ms) Frequency (hz) Side Effects  Left Brain       11/23/20 3-(90%)4-(10%)C+ 3.8 (2.8-4.0) 60 130   04/26/21 3-(90%)4-(10%)C+ 3.8 60 130   11/02/21 3-(90%)4-(10%)C+ 3.8 60 130   04/11/22 3-(90%)4-(10%)C+ 3.8 60 130   10/17/22 3-(90%)4-(10%)C+ 3.8 60 130   05/11/23 3-(90%)4-(10%)C+ 3.8 60 130   11/13/23 3-(90%)4-(10%)C+ 3.7 60 130                 Right Brain       11/23/20 2-(30%)3-(70%)C+ 3.2 60 130 Tried PW of 70 but not tolerated (?dizzy)  04/26/21 2-(30%)3-(70%)C+ 3.2 60 130   11/02/21 2-(30%)3-(70%)C+ 3.1 60 130   04/11/22 2-(30%)3-(70%)C+ 3.2 60 130   10/17/22 2-(30%)3-(70%)C+ 3.2 60 130   05/11/23 2-(30%)3-(70%)C+ 3.2 60 130   11/13/23 2-(30%)3-(70%)C+ 3.3 60 130

## 2023-11-14 ENCOUNTER — Ambulatory Visit: Payer: Medicare Other

## 2023-11-14 ENCOUNTER — Encounter: Payer: Self-pay | Admitting: Neurology

## 2023-11-14 ENCOUNTER — Other Ambulatory Visit: Payer: Self-pay

## 2023-11-14 DIAGNOSIS — R1310 Dysphagia, unspecified: Secondary | ICD-10-CM

## 2023-11-14 DIAGNOSIS — F411 Generalized anxiety disorder: Secondary | ICD-10-CM

## 2023-11-14 DIAGNOSIS — R471 Dysarthria and anarthria: Secondary | ICD-10-CM | POA: Diagnosis not present

## 2023-11-14 NOTE — Therapy (Signed)
OUTPATIENT SPEECH LANGUAGE PATHOLOGY PARKINSON'S TREATMENT/DISCHARGE  Patient Name: Ian [name] MRN: [id number] DOB:Dec 22, 1960, 62 y.o., male Today's Date: 11/14/2023  PCP: Tracey Harries, MD REFERRING PROVIDER: Vladimir Faster, DO   End of Session - 11/14/23 1151     Visit Number 12    Number of Visits 17    Date for SLP Re-Evaluation 12/08/23    SLP Start Time 1151    SLP Stop Time  1230    SLP Time Calculation (min) 39 min    Activity Tolerance Patient tolerated treatment well                 Past Medical History:  Diagnosis Date   Anxiety    History of kidney stones    passed   PD (Parkinson's disease) (HCC) 09/10/2013   PONV (postoperative nausea and vomiting)    nausea    Past Surgical History:  Procedure Laterality Date   COLONOSCOPY     MINOR PLACEMENT OF FIDUCIAL N/A 01/31/2017   Procedure: MINOR PLACEMENT OF FIDUCIAL;  Surgeon: Maeola Harman, MD;  Location: [facility] OR;  Service: Neurosurgery;  Laterality: N/A;  Fiducial placement   none     PULSE GENERATOR IMPLANT Right 02/17/2017   Procedure: Right implantable pulse generator placement;  Surgeon: Maeola Harman, MD;  Location: [facility] OR;  Service: Neurosurgery;  Laterality: Right;  Bilateral implantable pulse generator placement   SUBTHALAMIC STIMULATOR BATTERY REPLACEMENT N/A 10/19/2018   Procedure: Deep Brain stimulator implantable pulse generator revision;  Surgeon: Maeola Harman, MD;  Location: [facility] OR;  Service: Neurosurgery;  Laterality: N/A;  Deep Brain stimulator implantable pulse generator revision   SUBTHALAMIC STIMULATOR INSERTION Bilateral 02/10/2017   Procedure: Bilateral Deep brain stimulator placement;  Surgeon: Maeola Harman, MD;  Location: [facility] OR;  Service: Neurosurgery;  Laterality: Bilateral;  Bilateral Deep brain stimulator placement   UPPER GASTROINTESTINAL ENDOSCOPY     Patient Active Problem List   Diagnosis Date Noted   Hip flexor tendon tightness 09/20/2022   Closed fracture of phalanx of left  fifth toe 10/19/2021   Tinea versicolor 05/28/2021   Left rotator cuff tear 08/20/2020   Nonallopathic lesion of sacral region 07/25/2018   Nonallopathic lesion of lumbosacral region 07/25/2018   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   Acute bursitis of right shoulder 12/06/2017   Antalgic gait 01/24/2017   Intercostal muscle tear 07/19/2016   Parkinson's disease with dyskinesia and fluctuating manifestations (HCC) 06/24/2016   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 Course of ST cut short due to pt/SLP discussion on concurrent factors which are hindering pt's progress with ST including anxiety, DBS settings, and severity of deficits. After discussion today with pt - he has chosen (SLP agrees) to work on anxiety, Surveyor, minerals consult, and question about modifying DBS placement and then return to ST if he still thinks he requires ST. Told pt he should return if [employer] SLP rep does NOT perform an after placement follow up.   Remaining deficits: Dysarthria.   Education / Equipment: See below.    Patient agrees to discharge. Patient goals were not met. Patient is being discharged due to  the patient's request..      ONSET DATE: dx Parkinson's 2014, script 08/31/23  REFERRING DIAG: G20 (ICD-10-CM) - Parkinson's disease (HCC)  THERAPY DIAG:  Dysarthria and anarthria  Dysphagia, unspecified type  SUBJECTIVE:   SUBJECTIVE STATEMENT:  "I heard back from [employer]." Last PD med 0830, next 1315. Pt accompanied by: self  PERTINENT HISTORY: Dx  with PD Fall 2014, symptoms began 2012. Primary concerns are lethargy, anxiety, fast rate of speech. Had speech screen 08/31/23 and ST was recommended (see "diagnostic findings" below), along with MBS. MBS scheduled 09/19/23.  PAIN:  Are you having pain? No  FALLS: Has patient fallen in last 6 months?  No  DIAGNOSTIC FINDINGS: Encounter Date: 08/31/2023 Speech Therapy Parkinson's Disease Screen       Decibel Level today: low 70s dB  (WNL=70-72 dB) with sound level meter 30cm away from pt's mouth. Pt's conversational volume has maintained since last treatment course. Primary characteristic of pt's speech is increased rate which decreased intelligibility. Pt endorses this has worsened since last therapy course.   Pt does report difficulty with swallowing, which does warrant further evaluation. Recommend Modified Barium Swallow Study, ST will place order.  Pt reports challenges with word finding and organization related   Pt would benefit from speech-language eval for dysarthria, bedside swallow, and cognition- please order via EPIC or call [phone] to schedule  MBS 09/19/23 HPI/PMH: HPI: PHMx PD with DBS, followed by Dr. Arbutus Leas, currently being seen by OP ST to address dysarthria. Pt c/o occasional coughing with liquids and sensation of pills sticking. Symptoms have increased over last 3-4 mos per pt report. Clinical Impression: Clinical Impression: Pt presents with overall functional oropharyngeal swallow. Mild deficits observed with laryngeal elevation and delay observed with epiglottic inversion and laryngeal vestibule closure with thin liquids. Laryngeal vestibule observed to achieve full closure after swallow initiation, which allows barium to coat underside of epiglottis. No aspiration or penetration was observed across administered consistencies. Mild pharyngeal residue noted in valleculae and pyriform sinus which clears with cues dry swallow. SLP provided education on recommendations for use of  intentional swallow, routine oral care, continuation of regular diet with thin liquids. Advised pt to monitor symptoms and let neurologist, PCP, or primary SLP know of worsening symptoms, as swallow deficits are common with PD. Pt verbalizes understanding, plans to f/u with treating SLP.   PATIENT GOALS : "to get this speech more consistent"  OBJECTIVE:    PATIENT REPORTED OUTCOME MEASURES (PROM): Communication Participation Item Bank: (CPIB) provided 09/28/23. Pt scored himself 6/40, with greatest difficulty as in the picture below:    TODAY'S TREATMENT:  Speak out=SO 11/14/23: [name] entered speaking with fast rate of speech. Turned DBS off Saturday for two hours and speech rate did approximate WNL but the rest of his body also slowed down as well.  Pt stated he has thought about anxiety about speaking, and he approximates  30-40% of his challenges with rate are related to anxiety about his speech. SLP strongly suggested pt meet with Dr. Dellia Cloud every other week - he is going to start this ASAP.  CT scan will be completed to maximize DBS placement, and pt wants to look into [employer] device.  SLP/pt decided d/c would be best at this time to concentrate on these options, with return to ST at this clinic PRN at a later date.   11/07/23: Pt reports he is happy with the way his church meeting went with his speech, but had a fraternity meeting Sunday afternoon which  did not go as well - stuttered. "I think some of it was in my head." SLP spent the sessionexploring pt's psychological effects of his speech on his ability to reduce his rate to improve intelligibility. He says he, negatively, thinks about/worries about not stuttering and not speaking fast so that people can't understand him. He agrees with SLP that these thoughts and feelings detract from the mental energy and ability that he has to think more about intentionality, deliberateness, mindfulness about, positively, controlling his  speech.  10/31/23: SLP and pt discussed/SLP educated pt about compensatory measures to maximize speech fluidity. [name] told SLP his difficulty with psychological challenges to remain fluent paired with intent which are different than the challenge to be intentional which leads to incr'd fluent speech. Pt acknowledged his DBS makes speaking at a slower rate more difficult. SLP and pt came to the conclusion pt must practice at least twice a day 15 minutes each, or for 25 minutes, in addition to an online session with Lelon Mast. Today pt performed m-words, /a/, and glides with 90-95% success with intentional speech. He read an 8-paragraph (one page) selection with occasional min-mod A for intent to reduce speech rate. SLP also reiterated that pt's DBS may make pt's ability to slow his rate in the BEST of circumstances difficult; SLP encouraged pt to check with his MD for more information.   10/24/23: Meds at 0900. Better speech rate today than 10/19/23 upon entering ST room. Has been more faithful at practicing the cognitive portions of the SO lessons since last session. Today SLP had pt warm up with some spelling divergent naming task. Pt was also assisted in two sentence responses to incr intent and purpose in his speech in order to slow speech rate. 50% success until pt began to chunk words in his sentences, then success incr'd to approx 80%. SLP strongly encouraged pt to use this strategy in his practice sessions and as he speaks with friends and family.   10/19/23: Meds today at 0845. Pt arrived again with very fast speech with decr'd intelligibility. Given pt's past comments about his (negative) feelings about his speech, SLP inquired about his neurological vs psychological effects of his speech. SLP ascertained that certain conversations and/or pt's perception of certain conversations make it more difficult for him to focus on intent, being deliberate, and mindful. Today SLP encouraged pt to think about  what about these situations make it more challenging for him and think about how those feelings could be mitigated/eliminated.  Pt practiced deliberate speech with 2-3 sentence responses with self correction and rare min A for intent. SLP stressed pt needs to practice at this level for improved success with deliberate speech which should reduce pt's rate to approximate a more normal rate. SLP told pt to cont to practice at this level until next session.  10/17/23: Took meds at 0830, will take again at 1300. Pt has completed SO Lesson 24 since last session. He has practiced using online practice or practice in the car while away from home this weekend. Pt endorsed being more stressed today and the more he and SLP discussed his speech the more dysfluent he became. SLP guided pt through Lesson 20 which pt stated would be the last lesson that had a reading section which he felt he had at least 80% intention/deliberate speech.  Sustained "ah" average 87 dB Glides: 86 dB cue to open mouth for first production Reading: 76 dB - with occasional min cues for incr intent with speech.  He Id'd and attempted self correction in one instance of rushed speech SLP and pt also discussed pt's question of DBS making speech worse, which he had seen online. SLP encouraged pt to reach out to his neurologist and surgeon, as this can occur, but SLP was unsure of details of his system to provide him with more information. Pt stated he felt he needed to be more deliberate about practicing throughout the day so SLP suggested picking at least two 30-minute time periods to put intense focus on deliberate intentional speech. Pt agreed this would be helpful for him.   10/10/23: Took meds at 0900. Pt is on Lesson 22 of SO today. Today SLP assisted pt with SO to target improving vocal quality and increasing intensity through progressively difficulty speech tasks.ST lead pt through exercises providing singular model prior to pt execution with  occasional mod-A required to achieve target dB this date. Averages this date:  Sustained "ah" average 88 dB Glides: 88 dB cue to open mouth for first production Reading: 78 dB - with occasional mod cues for slowing rate and incr intention with speech. He Id'd and attempted self correction with 70% of rushes of speech Cognitive Task (multiple meaning words): Occasional mod cues for reducing rate Conversational sample between tasks was completed with pt demonstrating usual rushes of speech with usual mod A for deliberate speech. Pt described "s" statement re: situation yesterday when he returned from working out. SLP strongly suggested pt take 20-30 minute nap when he returns fatigued from a workout, or another task. Pt demonstrated understanding.   10/03/23: Pt took PD med approx 0900.  Today SLP assisted pt with SO to target improving vocal quality and increasing intensity through progressively difficulty speech tasks using [employer] Program lesson 16. ST lead pt through exercises providing singular model prior to pt execution with occasional mod-A required to achieve target dB this date. Averages this date:  Sustained "ah" 88 dB Glides: 87 dB cue to open mouth and glide instead of stair step reading 87dB - with usual mod cues for slowing rate and incr intention with speech. He Id'd and attempted self correction with 80% of rushes of speech Conversational sample between tasks pt averages rushes of speech with occasional min-mod A for deliberate speech.  09/28/23: Pt provided CPIB today. See above for scoring details.  Today SLP assisted pt with SO to target improving vocal quality and increasing intensity through progressively difficulty speech tasks using [employer] Program lesson 13. ST leads pt through exercises providing singular model prior to pt execution with occasional mod-A required to achieve target dB this date. Averages this date:  Sustained "ah" 90 dB reading 87dB cognitive speech  task 73 dB.  Conversational sample of approx 2 minutes, pt averages rushes of speech with rare mod-A for deliberate speech.   10/8/24[name] will need CPIB next session. Swallow: SLP taught pt about Mendelsohn. Pt performed x7 reps with fading cues. By 7th rep pt was counting his own 5-second hold appropriately. SLP and pt discussed his swallowing needs to be purposeful, based on the results of his MBS, to be more normalized (timing of vestibule closure is late - SLP postulates that pt is rushing the bolus into the pharynx.The plan is for pt to be more deliberate in his swallowing.  Speech: Pt is only on lesson 10 in SO book. SLP strongly reiterated consistency is the key to lasting change with his speech. SLP educated [name] today while practicing with SO practice online. SLP  provided min A occasionally for slowing rate and exhibiting deliberate speech  09/20/23: Next session SLP will need to teach pt Clair Gulling as a prophylactic swallowing exercise.  Today pt entered stating he is only doing Chief Strategy Officer. SLP reiterated that pt is to complete SO lesson/day and do an online session. He and wife watched introductory webinar again and [name] said, "It was good to see it again."  Target improving vocal quality and increasing intensity through progressively difficulty speech tasks using Spe[employer] Out! program, lesson 2. ST lead pt through exercises providing occasional model prior to pt execution for glides. rare min-A required to achieve target dB this date. Averages this date:  Sustained "ah" 95 dB; SLP suggested pt reduce volume and pt did so with 90dB average reading 85dB average and occasional min-mod cues for intent to reduce speech rate Conversational sample of approx 5 minutes, pt averages 70 dB with occasional mod-A for deliberate speech production for speech rate reduction.  09/13/23 (eval): Evaluation results, recommendations for skilled ST. SLP re-trained pt on SO using Lesson 4, which pt had  difficulty with using intentional speech on the reading portion (poem stanzas). SLP told pt to start with Lesson 4, and to go online and participate in online session on [email or web address]. He agreed. SLP also told pt homework was for he and Tonia to watch "what is Parkinson's" 60-minute webinar prior to next session.   PATIENT EDUCATION: Education details: see "today's treatment" above Person educated: Patient Education method: Explanation Education comprehension: verbalized understanding   HOME EXERCISE PROGRAM: Speak Out, beginning with Lesson 4 and completing until Lesson 24 with one lesson/day.   GOALS: Goals reviewed with patient? No  SHORT TERM GOALS: Target date: 10/13/23    Pt will maintain WNL rate of speech during structured practice (oral reading, cognitive exercise, etc.), multi-sentence level, with occasional min-A over 2 sessions.  Baseline: Goal status: Not met  2.  Pt will self-ID rushes in speech, as they occur, in 50% of opportunities to increase ability to self-correct rate with occasional min-A over 2 sessions.  Baseline:  Goal status: Not met  3.  Pt will report completion of dysarthria HEP at least 1x/day over 1 week period with mod-I Baseline:  Goal status: Not met  4. Pt will complete safe swallow strategies with modified independence in 2 sessions  Baseline:  Goal status: Deferred to work on speech  5. Pt will complete dysphagia HEP with modified independence in 2 sessions  Baseline:  Goal status: Deferred to work on speech  LONG TERM GOALS: Target date: 11/10/23    Pt will demonstrate average WNL rate of speech over 18+ minute conversational sample with no more than 4 rushes of speech over sample period, with rare min-A.  Baseline:  Goal status: not met  2.  Pt will ID and self-correct rushes in rate of speech in 70% of occurences in structured speech practice (e.g. oral reading) over 2 sessions with rare min-A.  Baseline:  Goal status:  not met  3.  Pt will report improvement via CPIB PROM by 4 points by d/c, indicative subjective improvement in communicative abilities.  Baseline:  Goal status: not met  ASSESSMENT:  CLINICAL IMPRESSION: Patient is a 62 y.o. M who was seen today for dysarthria in light of Parkinson's disease. See "today's treatment" for more details. [name] told SLP at eval that he continues to avoid some speaking situations at this time due to his speech, which he confirmed he still does during the session  today. Pt and SLP discussed pt's concurrent challenges (see "today's treatment") and decided to d/c to focus on these.  OBJECTIVE IMPAIRMENTS  Objective impairments include dysarthria. These impairments are limiting patient from effectively communicating at home and in community.Factors affecting potential to achieve goals and functional outcome are medical prognosis.. Patient will benefit from skilled SLP services to address above impairments and improve overall function.  REHAB POTENTIAL: Good  PLAN: SLP FREQUENCY: 2x/week  SLP DURATION: 8 weeks  PLANNED INTERVENTIONS: Aspiration precaution training, Pharyngeal strengthening exercises, Diet toleration management , Internal/external aids, SLP instruction and feedback, Compensatory strategies, and Patient/family education   [facility], CCC-SLP 11/14/2023, 11:52 AM

## 2023-11-24 ENCOUNTER — Ambulatory Visit (HOSPITAL_COMMUNITY)
Admission: RE | Admit: 2023-11-24 | Discharge: 2023-11-24 | Disposition: A | Discharge status: Home / Self Care | Payer: Medicare Other | Source: Ambulatory Visit | Attending: Neurology | Admitting: Neurology

## 2023-11-24 ENCOUNTER — Other Ambulatory Visit: Payer: Self-pay | Admitting: Neurology

## 2023-11-24 DIAGNOSIS — G20A1 Parkinson's disease without dyskinesia, without mention of fluctuations: Secondary | ICD-10-CM

## 2023-11-24 DIAGNOSIS — G20B2 Parkinson's disease with dyskinesia, with fluctuations: Secondary | ICD-10-CM | POA: Insufficient documentation

## 2023-11-24 MED ORDER — AMANTADINE HCL 100 MG PO CAPS: 100.0000 mg | ORAL_CAPSULE | Freq: Two times a day (BID) | ORAL | 1 refills | Status: DC

## 2023-11-27 ENCOUNTER — Ambulatory Visit: Payer: Medicare Other | Admitting: Psychology

## 2023-11-27 DIAGNOSIS — F411 Generalized anxiety disorder: Secondary | ICD-10-CM | POA: Diagnosis not present

## 2023-11-27 NOTE — Progress Notes (Signed)
[facility] Counselor Initial Adult Exam  Name: Ian [name] Date: 11/27/2023 MRN: [id number] DOB: 1961-07-19 PCP: Tracey Harries, MD  Time Spent: 7:30  am - 8:30 am: 68 Minutes   [name] participated from home, via video, and consented to treatment. Therapist participated from home office.    Guardian/Payee:  N/A    Paperwork requested: No   Reason for Visit /Presenting Problem: chronic anxiety  Mental Status Exam: Appearance:   Casual     Behavior:  Appropriate  Motor:  Normal  Speech/Language:   Rapid cadence and moderate stutter  Affect:  Appropriate  Mood:  anxious  Thought process:  normal  Thought content:    WNL  Sensory/Perceptual disturbances:    WNL  Orientation:  oriented to person, place, and situation  Attention:  Good  Concentration:  Good  Memory:  WNL  Fund of knowledge:   Good  Insight:    Good  Judgment:   Good  Impulse Control:  Good    Reported Symptoms:  worry, phobia (public speaking), stutter, rate of speech  Risk Assessment: Danger to Self:  No Self-injurious Behavior: No Danger to Others: No Duty to Warn:no Physical Aggression / Violence:No  Access to Firearms a concern:  unknown Gang Involvement:No  Patient / guardian was educated about steps to take if suicide or homicide risk level increases between visits: n/a While future psychiatric events cannot be accurately predicted, the patient does not currently require acute inpatient psychiatric care and does not currently meet [facility] involuntary commitment criteria.  Substance Abuse History: Current substance abuse: No     Past Psychiatric History:   No previous psychological problems have been observed Outpatient Providers:N/A History of Psych Hospitalization: No  Psychological Testing:  N/A    Abuse History:  Victim of: No.,  N/A    Report needed: No. Victim of Neglect:No. Perpetrator of  N/A   Witness / Exposure to  Domestic Violence: No   Protective Services Involvement: No  Witness to [employer] Violence:  No   Family History:  Family History  Problem Relation Age of Onset   Breast cancer Mother    Diabetes Father    Healthy Sister    Healthy Daughter    Healthy Son    Diabetes Other    Cancer Other     Living situation: the patient lives with their spouse  Sexual Orientation: Straight  Relationship Status: married  Name of spouse / other:unknown If a parent, number of children / ages:son 59 and 25 year old twin daughters  Support Systems: spouse Nutritional therapist Stress:  No   Income/Employment/Disability: Long-Term Research officer, political party Service: Yes   Educational History: Education: Risk manager: unknown  Any cultural differences that may affect / interfere with treatment:  N/A  Recreation/Hobbies: exercise, golf  Stressors: Health problems    Strengths: Supportive Relationships, Family, and Church  Barriers:  social withdrawal   Legal History: Pending legal issue / charges: The patient has no significant history of legal issues. History of legal issue / charges:  N/A  Medical History/Surgical History: reviewed Past Medical History:  Diagnosis Date   Anxiety    History of kidney stones    passed   PD (Parkinson's disease) (HCC) 09/10/2013   PONV (postoperative nausea and vomiting)    nausea     Past Surgical History:  Procedure  Laterality Date   COLONOSCOPY     MINOR PLACEMENT OF FIDUCIAL N/A 01/31/2017   Procedure: MINOR PLACEMENT OF FIDUCIAL;  Surgeon: Maeola Harman, MD;  Location: [facility] OR;  Service: Neurosurgery;  Laterality: N/A;  Fiducial placement   none     PULSE GENERATOR IMPLANT Right 02/17/2017   Procedure: Right implantable pulse generator placement;  Surgeon: Maeola Harman, MD;  Location: [facility] OR;  Service: Neurosurgery;  Laterality: Right;  Bilateral implantable pulse generator placement    SUBTHALAMIC STIMULATOR BATTERY REPLACEMENT N/A 10/19/2018   Procedure: Deep Brain stimulator implantable pulse generator revision;  Surgeon: Maeola Harman, MD;  Location: [facility] OR;  Service: Neurosurgery;  Laterality: N/A;  Deep Brain stimulator implantable pulse generator revision   SUBTHALAMIC STIMULATOR INSERTION Bilateral 02/10/2017   Procedure: Bilateral Deep brain stimulator placement;  Surgeon: Maeola Harman, MD;  Location: [facility] OR;  Service: Neurosurgery;  Laterality: Bilateral;  Bilateral Deep brain stimulator placement   UPPER GASTROINTESTINAL ENDOSCOPY      Medications: see above list.      Initial Session: Diagnosed with Parkinsons in 2014. He stutters and is trying to determine if his speech problems are anxiety/psychological or physical. He says he has had anxiety for a long time. Says he worries about things all the time. He says his mom had issues when he was growing up (addicted to pain meds) and that contributed to his chronic worry. He does think his anxiety is better than it was in the past. He is strongly spiritual. 25 year old son and 46 year old twin daughters. No depressive symptoms reported. Went on disability in 2014. He was on Ativan, but got off of it out of fear that it would become addicting to him. Married for 32 years and good relationship. 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 That was 2006. He was 22 years in [employer]. 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 His biggest issue currently is his speech in that by the end of the day, hard for people to understand him. Speech got bad in past 2-3 years. He goes to exercise classes for Parkinson's patients. Wife doesn't work. Strong supportive church family since 2007. Plays golf weekly and works out with Systems analyst 2 times a week. Sees kids often and just had first grandchild.  FOO: Father had diabetes. Fraternal  grandfather was alcoholic and spent time in jail. Mother dies of breast cancer later in life. Father died during pandemic at 68 (in 2021). He has a twin sister in [place] and they ar "very close". He isn't as close to some of his guys friends lately and he thinks he pulled back due to the speech issues. Hasn't shared with everyone that he has Parkinson's. He lost his closest lifelong friend in 2021. This was 3 months after his father passed.  He has tried breathing exercises to reduce his anxiety with mild results. Going for a walk or exercise helps anxiety the best. Reports sleep is good. Told him to keep an anxiety journal with rating anxiety on a 0-10 scale. Also recommended The Anxiety and Phobia Workbook. He has become phobic about public speaking.     Goals/Treatment Plan: Patient seeking counseling to reduce above named symptoms of anxiety. He is also working on adjusting to his Parkinson's diagnosis. Hoping to improve speech issues that are related to his anxiety and regain self-confidence. Will employ behavioral strates and  insigh oriented psychotherapy interventions. Goal Date is 12-25.   Diagnoses:  Generalized Anxiety Disorder  Plan of Care: Outpatient Psychotherapy   Garrel Ridgel, PhD

## 2023-12-05 ENCOUNTER — Other Ambulatory Visit: Payer: Self-pay | Admitting: Neurology

## 2023-12-05 ENCOUNTER — Encounter: Payer: Self-pay | Admitting: Neurology

## 2023-12-05 DIAGNOSIS — G20B2 Parkinson's disease with dyskinesia, with fluctuations: Secondary | ICD-10-CM

## 2023-12-05 MED ORDER — RYTARY 23.75-95 MG PO CPCR: ORAL_CAPSULE | ORAL | 1 refills | Status: DC

## 2023-12-05 NOTE — Telephone Encounter (Signed)
Sent!

## 2023-12-11 ENCOUNTER — Ambulatory Visit: Payer: Medicare Other | Admitting: Psychology

## 2023-12-14 ENCOUNTER — Ambulatory Visit: Payer: Medicare Other | Admitting: Psychology

## 2023-12-14 DIAGNOSIS — F411 Generalized anxiety disorder: Secondary | ICD-10-CM | POA: Diagnosis not present

## 2023-12-14 NOTE — Progress Notes (Signed)
[facility] Counselor Initial Adult Exam  Name: Ian [name] Date: 12/14/2023 MRN: [id number] DOB: Oct 12, 1961 PCP: Tracey Harries, MD     [name] participated from home, via video, and consented to treatment. Therapist participated from home office.    Guardian/Payee:  N/A    Paperwork requested: No   Reason for Visit /Presenting Problem: chronic anxiety  Mental Status Exam: Appearance:   Casual     Behavior:  Appropriate  Motor:  Normal  Speech/Language:   Rapid cadence and moderate stutter  Affect:  Appropriate  Mood:  anxious  Thought process:  normal  Thought content:    WNL  Sensory/Perceptual disturbances:    WNL  Orientation:  oriented to person, place, and situation  Attention:  Good  Concentration:  Good  Memory:  WNL  Fund of knowledge:   Good  Insight:    Good  Judgment:   Good  Impulse Control:  Good    Reported Symptoms:  worry, phobia (public speaking), stutter, rate of speech  Risk Assessment: Danger to Self:  No Self-injurious Behavior: No Danger to Others: No Duty to Warn:no Physical Aggression / Violence:No  Access to Firearms a concern:  unknown Gang Involvement:No  Patient / guardian was educated about steps to take if suicide or homicide risk level increases between visits: n/a While future psychiatric events cannot be accurately predicted, the patient does not currently require acute inpatient psychiatric care and does not currently meet [facility] involuntary commitment criteria.  Substance Abuse History: Current substance abuse: No     Past Psychiatric History:   No previous psychological problems have been observed Outpatient Providers:N/A History of Psych Hospitalization: No  Psychological Testing:  N/A    Abuse History:  Victim of: No.,  N/A    Report needed: No. Victim of Neglect:No. Perpetrator of  N/A   Witness / Exposure to Domestic  Violence: No   Protective Services Involvement: No  Witness to [employer] Violence:  No   Family History:  Family History  Problem Relation Age of Onset   Breast cancer Mother    Diabetes Father    Healthy Sister    Healthy Daughter    Healthy Son    Diabetes Other    Cancer Other     Living situation: the patient lives with their spouse  Sexual Orientation: Straight  Relationship Status: married  Name of spouse / other:unknown If a parent, number of children / ages:son 67 and 51 year old twin daughters  Support Systems: spouse Nutritional therapist Stress:  No   Income/Employment/Disability: Long-Term Research officer, political party Service: Yes   Educational History: Education: Risk manager: unknown  Any cultural differences that may affect / interfere with treatment:  N/A  Recreation/Hobbies: exercise, golf  Stressors: Health problems    Strengths: Supportive Relationships, Family, and Church  Barriers:  social withdrawal   Legal History: Pending legal issue / charges: The patient has no significant history of legal issues. History of legal issue / charges:  N/A  Medical History/Surgical History: reviewed Past Medical History:  Diagnosis Date   Anxiety    History of kidney stones    passed   PD (Parkinson's disease) (HCC) 09/10/2013   PONV (postoperative nausea and vomiting)    nausea  Past Surgical History:  Procedure Laterality Date   COLONOSCOPY     MINOR PLACEMENT OF FIDUCIAL N/A 01/31/2017   Procedure: MINOR PLACEMENT OF FIDUCIAL;  Surgeon: Maeola Harman, MD;  Location: [facility] OR;  Service: Neurosurgery;  Laterality: N/A;  Fiducial placement   none     PULSE GENERATOR IMPLANT Right 02/17/2017   Procedure: Right implantable pulse generator placement;  Surgeon: Maeola Harman, MD;  Location: [facility] OR;  Service: Neurosurgery;  Laterality: Right;  Bilateral implantable pulse generator placement    SUBTHALAMIC STIMULATOR BATTERY REPLACEMENT N/A 10/19/2018   Procedure: Deep Brain stimulator implantable pulse generator revision;  Surgeon: Maeola Harman, MD;  Location: [facility] OR;  Service: Neurosurgery;  Laterality: N/A;  Deep Brain stimulator implantable pulse generator revision   SUBTHALAMIC STIMULATOR INSERTION Bilateral 02/10/2017   Procedure: Bilateral Deep brain stimulator placement;  Surgeon: Maeola Harman, MD;  Location: [facility] OR;  Service: Neurosurgery;  Laterality: Bilateral;  Bilateral Deep brain stimulator placement   UPPER GASTROINTESTINAL ENDOSCOPY      Medications: see above list.      Initial Session: Diagnosed with Parkinsons in 2014. He stutters and is trying to determine if his speech problems are anxiety/psychological or physical. He says he has had anxiety for a long time. Says he worries about things all the time. He says his mom had issues when he was growing up (addicted to pain meds) and that contributed to his chronic worry. He does think his anxiety is better than it was in the past. He is strongly spiritual. 72 year old son and 41 year old twin daughters. No depressive symptoms reported. Went on disability in 2014. He was on Ativan, but got off of it out of fear that it would become addicting to him. Married for 32 years and good relationship. 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 That was 2006. He was 22 years in [employer]. 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 His biggest issue currently is his speech in that by the end of the day, hard for people to understand him. Speech got bad in past 2-3 years. He goes to exercise classes for Parkinson's patients. Wife doesn't work. Strong supportive church family since 2007. Plays golf weekly and works out with Systems analyst 2 times a week. Sees kids often and just had first grandchild.  FOO: Father had diabetes. Fraternal  grandfather was alcoholic and spent time in jail. Mother dies of breast cancer later in life. Father died during pandemic at 66 (in 2021). He has a twin sister in [place] and they ar "very close". He isn't as close to some of his guys friends lately and he thinks he pulled back due to the speech issues. Hasn't shared with everyone that he has Parkinson's. He lost his closest lifelong friend in 2021. This was 3 months after his father passed.  He has tried breathing exercises to reduce his anxiety with mild results. Going for a walk or exercise helps anxiety the best. Reports sleep is good. Told him to keep an anxiety journal with rating anxiety on a 0-10 scale. Also recommended The Anxiety and Phobia Workbook. He has become phobic about public speaking.     Goals/Treatment Plan: Patient seeking counseling to reduce above named symptoms of anxiety. He is also working on adjusting to his Parkinson's diagnosis. Hoping to improve speech issues that are related to his anxiety and regain self-confidence.  Will employ behavioral strates and insight oriented psychotherapy interventions. Goal Date is 12-25.   Diagnoses:  Generalized Anxiety Disorder  Plan of Care: Outpatient Psychotherapy  Patient was seen via video session ([employer]) and is aware of the platform limitations. He is at home and I am at office. Session Note: Patient kept his anxiety diary and only had 1 day of high anxiety. He and wife had an argument that day. Had a number of days of lower anxiety. He got the anxiety workbook and told him to read first two chapters on anxiety. Discussed mindfulness and using some apps to practice relaxation and meditation. Told him to use his Core Meditation Trainer on a daily basis. He has had device for a year with little use. Will try it and if he struggles, will check out the Calm and Mindspace apps I recommended.     Garrel Ridgel, PhD 7:35a-8:25a 50 minutes

## 2023-12-18 NOTE — Progress Notes (Deleted)
  [name] [address] [place] [address] Phone: [phone] Subjective:    I'm seeing this patient by the request  of:  [name], MD  CC:   YEP:Dlagzrupcz  Ian [name] is a 62 y.o. male coming in with complaint of back and neck pain. OMT 10/26/2023.Patient states   Medications patient has been prescribed: None  Taking:         Reviewed prior external information including notes and imaging from previsou exam, outside providers and external EMR if available.   As well as notes that were available from care everywhere and other healthcare systems.  Past medical history, social, surgical and family history all reviewed in electronic medical record.  No pertanent information unless stated regarding to the chief complaint.   Past Medical History:  Diagnosis Date   Anxiety    History of kidney stones    passed   PD (Parkinson's disease) (HCC) 09/10/2013   PONV (postoperative nausea and vomiting)    nausea     No Known Allergies   Review of Systems:  No headache, visual changes, nausea, vomiting, diarrhea, constipation, dizziness, abdominal pain, skin rash, fevers, chills, night sweats, weight loss, swollen lymph nodes, body aches, joint swelling, chest pain, shortness of breath, mood changes. POSITIVE muscle aches  Objective  There were no vitals taken for this visit.   General: No apparent distress alert and oriented x3 mood and affect normal, dressed appropriately.  HEENT: Pupils equal, extraocular movements intact  Respiratory: Patient's speak in full sentences and does not appear short of breath  Cardiovascular: No lower extremity edema, non tender, no erythema  Gait MSK:  Back   Osteopathic findings  C2 flexed rotated and side bent right C6 flexed rotated and side bent left T3 extended rotated and side bent right inhaled rib T9 extended rotated and side bent left L2 flexed rotated and side bent right Sacrum  right on right       Assessment and Plan:  No problem-specific Assessment & Plan notes found for this encounter.    Nonallopathic problems  Decision today to treat with OMT was based on Physical Exam  After verbal consent patient was treated with HVLA, ME, FPR techniques in cervical, rib, thoracic, lumbar, and sacral  areas  Patient tolerated the procedure well with improvement in symptoms  Patient given exercises, stretches and lifestyle modifications  See medications in patient instructions if given  Patient will follow up in 4-8 weeks             Note: This dictation was prepared with Dragon dictation along with smaller phrase technology. Any transcriptional errors that result from this process are unintentional.

## 2023-12-25 ENCOUNTER — Encounter: Payer: Self-pay | Admitting: Family Medicine

## 2023-12-25 ENCOUNTER — Ambulatory Visit: Payer: Medicare Other | Admitting: Psychology

## 2023-12-25 DIAGNOSIS — F411 Generalized anxiety disorder: Secondary | ICD-10-CM | POA: Diagnosis not present

## 2023-12-25 NOTE — Progress Notes (Signed)
 [facility] Counselor Initial Adult Exam  Name: Ian [name] Date: 12/25/2023 MRN: [id number] DOB: Jan 09, 1961 PCP: [name], MD     [name] participated from home, via video, and consented to treatment. Therapist participated from home office.    Guardian/Payee:  N/A    Paperwork requested: No   Reason for Visit /Presenting Problem: chronic anxiety  Mental Status Exam: Appearance:   Casual     Behavior:  Appropriate  Motor:  Normal  Speech/Language:   Rapid cadence and moderate stutter  Affect:  Appropriate  Mood:  anxious  Thought process:  normal  Thought content:    WNL  Sensory/Perceptual disturbances:    WNL  Orientation:  oriented to person, place, and situation  Attention:  Good  Concentration:  Good  Memory:  WNL  Fund of knowledge:   Good  Insight:    Good  Judgment:   Good  Impulse Control:  Good    Reported Symptoms:  worry, phobia (public speaking), stutter, rate of speech  Risk Assessment: Danger to Self:  No Self-injurious Behavior: No Danger to Others: No Duty to Warn:no Physical Aggression / Violence:No  Access to Firearms a concern:  unknown Gang Involvement:No  Patient / guardian was educated about steps to take if suicide or homicide risk level increases between visits: n/a While future psychiatric events cannot be accurately predicted, the patient does not currently require acute inpatient psychiatric care and does not currently meet [facility]  involuntary commitment criteria.  Substance Abuse History: Current substance abuse: No     Past Psychiatric History:   No previous psychological problems have been observed Outpatient Providers:N/A History of Psych Hospitalization: No  Psychological Testing:  N/A    Abuse History:  Victim of: No.,  N/A    Report needed: No. Victim of Neglect:No. Perpetrator of  N/A   Witness / Exposure to Domestic  Violence: No   Protective Services Involvement: No  Witness to [employer] Violence:  No   Family History:  Family History  Problem Relation Age of Onset   Breast cancer Mother    Diabetes Father    Healthy Sister    Healthy Daughter    Healthy Son    Diabetes Other    Cancer Other     Living situation: the patient lives with their spouse  Sexual Orientation: Straight  Relationship Status: married  Name of spouse / other:unknown If a parent, number of children / ages:son 50 and 33 year old twin daughters  Support Systems: spouse Nutritional Therapist Stress:  No   Income/Employment/Disability: Long-Term Research Officer, Political Party Service: Yes   Educational History: Education: Risk Manager: unknown  Any cultural differences that may affect / interfere with treatment:  N/A  Recreation/Hobbies: exercise, golf  Stressors: Health problems    Strengths: Supportive Relationships, Family, and Church  Barriers:  social withdrawal   Legal History: Pending legal issue / charges: The patient has no significant history of legal issues. History of legal issue / charges:  N/A  Medical History/Surgical History: reviewed Past Medical History:  Diagnosis Date   Anxiety    History of kidney stones    passed   PD (Parkinson's disease) (HCC) 09/10/2013   PONV (postoperative nausea and vomiting)    nausea  Past Surgical History:  Procedure Laterality Date   COLONOSCOPY     MINOR PLACEMENT OF FIDUCIAL N/A 01/31/2017   Procedure: MINOR PLACEMENT OF FIDUCIAL;  Surgeon: [name], MD;  Location: [facility] OR;  Service: Neurosurgery;  Laterality: N/A;  Fiducial placement   none     PULSE GENERATOR IMPLANT Right 02/17/2017   Procedure: Right implantable pulse generator placement;  Surgeon: [name], MD;  Location: [facility] OR;  Service: Neurosurgery;  Laterality: Right;  Bilateral implantable pulse generator placement    SUBTHALAMIC STIMULATOR BATTERY REPLACEMENT N/A 10/19/2018   Procedure: Deep Brain stimulator implantable pulse generator revision;  Surgeon: Levels Fairy, MD;  Location: [facility] OR;  Service: Neurosurgery;  Laterality: N/A;  Deep Brain stimulator implantable pulse generator revision   SUBTHALAMIC STIMULATOR INSERTION Bilateral 02/10/2017   Procedure: Bilateral Deep brain stimulator placement;  Surgeon: [name], MD;  Location: [facility] OR;  Service: Neurosurgery;  Laterality: Bilateral;  Bilateral Deep brain stimulator placement   UPPER GASTROINTESTINAL ENDOSCOPY      Medications: see above list.      Initial Session: Diagnosed with Parkinsons in 2014. He stutters and is trying to determine if his speech problems are anxiety/psychological or physical. He says he has had anxiety for a long time. Says he worries about things all the time. He says his mom had issues when he was growing up (addicted to pain meds) and that contributed to his chronic worry. He does think his anxiety is better than it was in the past. He is strongly spiritual. 62 year old son and 62 year old twin daughters. No depressive symptoms reported. Went on disability in 2014. He was on Ativan , but got off of it out of fear that it would become addicting to him. Married for 32 years and good relationship. 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 That was 2006. He was 22 years in [employer]. He has a very strong work associate professor and now he struggles with fitting in with new lifestyle. His biggest issue currently is his speech in that by the end of the day, hard for people to understand him. Speech got bad in past 2-3 years. He goes to exercise classes for Parkinson's patients. Wife doesn't work. Strong supportive church family since 2007. Plays golf weekly and works out with systems analyst 2 times a week. Sees kids often and just had first grandchild.  FOO: Father had diabetes. Fraternal  grandfather was alcoholic and spent time in jail. Mother dies of breast cancer later in life. Father died during pandemic at 9 (in 2021). He has a twin sister in [place] and they ar very close. He isn't as close to some of his guys friends lately and he thinks he pulled back due to the speech issues. Hasn't shared with everyone that he has Parkinson's. He lost his closest lifelong friend in 2021. This was 3 months after his father passed.  He has tried breathing exercises to reduce his anxiety with mild results. Going for a walk or exercise helps anxiety the best. Reports sleep is good. Told him to keep an anxiety journal with rating anxiety on a 0-10 scale. Also recommended The Anxiety and Phobia Workbook. He has become phobic about public speaking.     Goals/Treatment Plan: Patient seeking counseling to reduce above named symptoms of anxiety. He is also working on adjusting to his Parkinson's diagnosis. Hoping to improve speech issues that are related to his anxiety and regain self-confidence.  Will employ behavioral strates and insight oriented psychotherapy interventions. Goal Date is 12-25.   Diagnoses:  Generalized Anxiety Disorder  Plan of Care: Outpatient Psychotherapy  Patient was seen via video session (Caregility) and is aware of the platform limitations. He is at home and I am at office. Session Note: Patient says he had anxiety during holiday because he was up so late and around a lot of people. Distraction works well for him. He did try to use some apps to relax. We talked about using on-line option for a support group, but only rarely participates. Also, we discussed how to incorporated heart rate monitoring into his relaxation strategies. He uses the device given to him by his daughter. One issue is whether to share with his community about his disease. It creates stress for him to continue to conceal. Suggested that he may benefit by sharing his condition with others and reduce  the stress associated with trying to act as though there is no problem.          CONI ALM KERNS, PhD 7:35a-8:30a 50 minutes                CONI ALM KERNS, PhD

## 2023-12-26 ENCOUNTER — Ambulatory Visit: Payer: Medicare Other | Admitting: Family Medicine

## 2023-12-27 NOTE — Progress Notes (Signed)
  [name] [address] [place] [address] Phone: [phone] Subjective:   [name], am serving as a scribe for Dr. Arthea Claudene.  I'm seeing this patient by the request  of:  [name], MD  CC: Neck and back pain follow-up  YEP:Dlagzrupcz  Ian [name] is a 63 y.o. male coming in with complaint of back and neck pain. OMT 10/26/2023. Patient states that he is doing well.   Medications patient has been prescribed: None  Taking:         Reviewed prior external information including notes and imaging from previsou exam, outside providers and external EMR if available.   As well as notes that were available from care everywhere and other healthcare systems.  Past medical history, social, surgical and family history all reviewed in electronic medical record.  No pertanent information unless stated regarding to the chief complaint.   Past Medical History:  Diagnosis Date   Anxiety    History of kidney stones    passed   PD (Parkinson's disease) (HCC) 09/10/2013   PONV (postoperative nausea and vomiting)    nausea     No Known Allergies   Review of Systems:  No headache, visual changes, nausea, vomiting, diarrhea, constipation, dizziness, abdominal pain, skin rash, fevers, chills, night sweats, weight loss, swollen lymph nodes, body aches, joint swelling, chest pain, shortness of breath, mood changes. POSITIVE muscle aches  Objective  Blood pressure 112/72, pulse 80, height 6' 2 (1.88 m), weight 173 lb (78.5 kg), SpO2 97%.   General: No apparent distress alert and oriented x3 mood and affect normal, dressed appropriately.  HEENT: Pupils equal, extraocular movements intact  Respiratory: Patient's speak in full sentences and does not appear short of breath  Cardiovascular: No lower extremity edema, non tender, no erythema  Gait MSK:  Back does have some loss lordosis noted but very minimal.  Improvement in core  strength.  Some limited sidebending bilaterally.  Osteopathic findings  C2 flexed rotated and side bent right C4 flexed rotated and side bent left T3 extended rotated and side bent right inhaled rib L2 flexed rotated and side bent right Sacrum right on right    Assessment and Plan:  Slipped rib syndrome Patient has been doing remarkably well, no longer even having a resting tremor that I can see at the moment.  Discussed with patient that icing regimen and home exercises otherwise.  Continue to be active.  Patient would like to continue to follow-up in 2 to 45-month intervals to further evaluate to make sure we do not lose any type of gains that we have had an further evaluation.    Nonallopathic problems  Decision today to treat with OMT was based on Physical Exam  After verbal consent patient was treated with HVLA, ME, FPR techniques in cervical, rib, thoracic, lumbar, and sacral  areas  Patient tolerated the procedure well with improvement in symptoms  Patient given exercises, stretches and lifestyle modifications  See medications in patient instructions if given  Patient will follow up in 4-8 weeks    The above documentation has been reviewed and is accurate and complete [name], DO         Note: This dictation was prepared with Dragon dictation along with smaller phrase technology. Any transcriptional errors that result from this process are unintentional.

## 2023-12-29 ENCOUNTER — Ambulatory Visit (INDEPENDENT_AMBULATORY_CARE_PROVIDER_SITE_OTHER): Payer: Medicare Other | Admitting: Family Medicine

## 2023-12-29 ENCOUNTER — Encounter: Payer: Self-pay | Admitting: Family Medicine

## 2023-12-29 VITALS — BP 112/72 | HR 80 | Ht 74.0 in | Wt 173.0 lb

## 2023-12-29 DIAGNOSIS — M9903 Segmental and somatic dysfunction of lumbar region: Secondary | ICD-10-CM

## 2023-12-29 DIAGNOSIS — M9904 Segmental and somatic dysfunction of sacral region: Secondary | ICD-10-CM

## 2023-12-29 DIAGNOSIS — M9902 Segmental and somatic dysfunction of thoracic region: Secondary | ICD-10-CM

## 2023-12-29 DIAGNOSIS — M940 Chondrocostal junction syndrome [Tietze]: Secondary | ICD-10-CM

## 2023-12-29 DIAGNOSIS — M9901 Segmental and somatic dysfunction of cervical region: Secondary | ICD-10-CM

## 2023-12-29 DIAGNOSIS — M9908 Segmental and somatic dysfunction of rib cage: Secondary | ICD-10-CM

## 2023-12-29 NOTE — Patient Instructions (Signed)
See me again in 7-8 weeks 

## 2023-12-29 NOTE — Assessment & Plan Note (Signed)
 Patient has been doing remarkably well, no longer even having a resting tremor that I can see at the moment.  Discussed with patient that icing regimen and home exercises otherwise.  Continue to be active.  Patient would like to continue to follow-up in 2 to 74-month intervals to further evaluate to make sure we do not lose any type of gains that we have had an further evaluation.

## 2024-01-08 ENCOUNTER — Ambulatory Visit: Payer: Medicare Other | Admitting: Psychology

## 2024-01-12 ENCOUNTER — Ambulatory Visit (INDEPENDENT_AMBULATORY_CARE_PROVIDER_SITE_OTHER): Payer: Medicare Other | Admitting: Psychology

## 2024-01-12 DIAGNOSIS — F411 Generalized anxiety disorder: Secondary | ICD-10-CM

## 2024-01-12 NOTE — Progress Notes (Signed)
[facility] Counselor Initial Adult Exam  Name: Ian [name] Date: 01/12/2024 MRN: [id number] DOB: 26-Oct-1961 PCP: Tracey Harries, MD     [name] participated from home, via video, and consented to treatment. Therapist participated from home office.    Guardian/Payee:  N/A    Paperwork requested: No   Reason for Visit /Presenting Problem: chronic anxiety  Mental Status Exam: Appearance:   Casual     Behavior:  Appropriate  Motor:  Normal  Speech/Language:   Rapid cadence and moderate stutter  Affect:  Appropriate  Mood:  anxious  Thought process:  normal  Thought content:    WNL  Sensory/Perceptual disturbances:    WNL  Orientation:  oriented to person, place, and situation  Attention:  Good  Concentration:  Good  Memory:  WNL  Fund of knowledge:   Good  Insight:    Good  Judgment:   Good  Impulse Control:  Good    Reported Symptoms:  worry, phobia (public speaking), stutter, rate of speech  Risk Assessment: Danger to Self:  No Self-injurious Behavior: No Danger to Others: No Duty to Warn:no Physical Aggression / Violence:No  Access to Firearms a concern:  unknown Gang Involvement:No  Patient / guardian was educated about steps to take if suicide or homicide risk level increases between visits: n/a While future psychiatric events cannot be accurately predicted, the patient does not currently require acute inpatient psychiatric care and does not currently meet [facility] involuntary commitment criteria.  Substance Abuse History: Current substance abuse: No     Past Psychiatric History:   No previous psychological problems have been observed Outpatient Providers:N/A History of Psych Hospitalization: No  Psychological Testing:  N/A    Abuse History:  Victim of: No.,  N/A    Report needed: No. Victim of Neglect:No. Perpetrator of  N/A    Witness / Exposure to Domestic Violence: No   Protective Services Involvement: No  Witness to [employer] Violence:  No   Family History:  Family History  Problem Relation Age of Onset   Breast cancer Mother    Diabetes Father    Healthy Sister    Healthy Daughter    Healthy Son    Diabetes Other    Cancer Other     Living situation: the patient lives with their spouse  Sexual Orientation: Straight  Relationship Status: married  Name of spouse / other:unknown If a parent, number of children / ages:son 52 and 47 year old twin daughters  Support Systems: spouse Nutritional therapist Stress:  No   Income/Employment/Disability: Long-Term Research officer, political party Service: Yes   Educational History: Education: Risk manager: unknown  Any cultural differences that may affect / interfere with treatment:  N/A  Recreation/Hobbies: exercise, golf  Stressors: Health problems    Strengths: Supportive Relationships, Family, and Church  Barriers:  social withdrawal   Legal History: Pending legal issue / charges: The patient has no significant history of legal issues. History of legal issue / charges:  N/A  Medical History/Surgical History: reviewed Past Medical History:  Diagnosis Date   Anxiety    History of kidney stones    passed   PD (Parkinson's disease) (HCC)  09/10/2013   PONV (postoperative nausea and vomiting)    nausea     Past Surgical History:  Procedure Laterality Date   COLONOSCOPY     MINOR PLACEMENT OF FIDUCIAL N/A 01/31/2017   Procedure: MINOR PLACEMENT OF FIDUCIAL;  Surgeon: Maeola Harman, MD;  Location: [facility] OR;  Service: Neurosurgery;  Laterality: N/A;  Fiducial placement   none     PULSE GENERATOR IMPLANT Right 02/17/2017   Procedure: Right implantable pulse generator placement;  Surgeon: Maeola Harman, MD;  Location: [facility] OR;  Service: Neurosurgery;  Laterality: Right;  Bilateral implantable pulse  generator placement   SUBTHALAMIC STIMULATOR BATTERY REPLACEMENT N/A 10/19/2018   Procedure: Deep Brain stimulator implantable pulse generator revision;  Surgeon: Maeola Harman, MD;  Location: [facility] OR;  Service: Neurosurgery;  Laterality: N/A;  Deep Brain stimulator implantable pulse generator revision   SUBTHALAMIC STIMULATOR INSERTION Bilateral 02/10/2017   Procedure: Bilateral Deep brain stimulator placement;  Surgeon: Maeola Harman, MD;  Location: [facility] OR;  Service: Neurosurgery;  Laterality: Bilateral;  Bilateral Deep brain stimulator placement   UPPER GASTROINTESTINAL ENDOSCOPY      Medications: see above list.      Initial Session: Diagnosed with Parkinsons in 2014. He stutters and is trying to determine if his speech problems are anxiety/psychological or physical. He says he has had anxiety for a long time. Says he worries about things all the time. He says his mom had issues when he was growing up (addicted to pain meds) and that contributed to his chronic worry. He does think his anxiety is better than it was in the past. He is strongly spiritual. 70 year old son and 24 year old twin daughters. No depressive symptoms reported. Went on disability in 2014. He was on Ativan, but got off of it out of fear that it would become addicting to him. Married for 32 years and good relationship. 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 That was 2006. He was 22 years in [employer]. 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 His biggest issue currently is his speech in that by the end of the day, hard for people to understand him. Speech got bad in past 2-3 years. He goes to exercise classes for Parkinson's patients. Wife doesn't work. Strong supportive church family since 2007. Plays golf weekly and works out with Systems analyst 2 times a week. Sees kids often and just had first grandchild.  FOO: Father had  diabetes. Fraternal grandfather was alcoholic and spent time in jail. Mother dies of breast cancer later in life. Father died during pandemic at 1 (in 2021). He has a twin sister in [place] and they ar "very close". He isn't as close to some of his guys friends lately and he thinks he pulled back due to the speech issues. Hasn't shared with everyone that he has Parkinson's. He lost his closest lifelong friend in 2021. This was 3 months after his father passed.  He has tried breathing exercises to reduce his anxiety with mild results. Going for a walk or exercise helps anxiety the best. Reports sleep is good. Told him to keep an anxiety journal with rating anxiety on a 0-10 scale. Also recommended The Anxiety and Phobia Workbook. He has become phobic about public speaking.     Goals/Treatment Plan: Patient seeking counseling to reduce above named symptoms of anxiety. He is also working on adjusting to his  Parkinson's diagnosis. Hoping to improve speech issues that are related to his anxiety and regain self-confidence. Will employ behavioral strates and insight oriented psychotherapy interventions. Goal Date is 12-25.   Diagnoses:  Generalized Anxiety Disorder  Plan of Care: Outpatient Psychotherapy  Patient was seen via video session (Caregility) and is aware of the platform limitations. He is at home and I am at office. Session Note: Patient discussed the connection between his anxiety and physical symptoms. We also talked about negative self talk, especially at the beginning of the week. He created a "to do" list and that helped keep him distracted. He continues to "surf" apps to find what works best for him. He has read chapter 3 of workbook. We discussed the need to practice the exercises of relaxation. He is competitive and is able to use that to stay with and be successful with his efforts. He is invested I'm minimizing drugs and using behavioral strategies. We talked about the possibility of  participating in meditation retreats.   Garrel Ridgel, PhD 7:35a-8:30a 50 minutes                Garrel Ridgel, PhD

## 2024-01-22 ENCOUNTER — Ambulatory Visit (INDEPENDENT_AMBULATORY_CARE_PROVIDER_SITE_OTHER): Payer: Medicare Other | Admitting: Psychology

## 2024-01-22 DIAGNOSIS — F411 Generalized anxiety disorder: Secondary | ICD-10-CM | POA: Diagnosis not present

## 2024-01-22 NOTE — Progress Notes (Signed)
[facility] Counselor Initial Adult Exam  Name: Ian [name] Date: 01/22/2024 MRN: [id number] DOB: [birth year] PCP: Ian Harries, MD     Ian [name] participated from home, via video, and consented to treatment. Therapist participated from home office.    Guardian/Payee:  N/A    Paperwork requested: No   Reason for Visit /Presenting Problem: chronic anxiety  Mental Status Exam: Appearance:   Casual     Behavior:  Appropriate  Motor:  Normal  Speech/Language:   Rapid cadence and moderate stutter  Affect:  Appropriate  Mood:  anxious  Thought process:  normal  Thought content:    WNL  Sensory/Perceptual disturbances:    WNL  Orientation:  oriented to person, place, and situation  Attention:  Good  Concentration:  Good  Memory:  WNL  Fund of knowledge:   Good  Insight:    Good  Judgment:   Good  Impulse Control:  Good    Reported Symptoms:  worry, phobia (public speaking), stutter, rate of speech  Risk Assessment: Danger to Self:  No Self-injurious Behavior: No Danger to Others: No Duty to Warn:no Physical Aggression / Violence:No  Access to Firearms a concern:  unknown Gang Involvement:No  Patient / guardian was educated about steps to take if suicide or homicide risk level increases between visits: n/a While future psychiatric events cannot be accurately predicted, the patient does not currently require acute inpatient psychiatric care and does not currently meet [facility] involuntary commitment criteria.  Substance Abuse History: Current substance abuse: No     Past Psychiatric History:   No previous psychological problems have been observed Outpatient Providers:N/A History of Psych Hospitalization: No  Psychological Testing:  N/A    Abuse History:  Victim of: No.,  N/A    Report needed: No. Victim of Neglect:No. Perpetrator of  N/A    Witness / Exposure to Domestic Violence: No   Protective Services Involvement: No  Witness to [employer] Violence:  No   Family History:  Family History  Problem Relation Age of Onset   Breast cancer Mother    Diabetes Father    Healthy Sister    Healthy Daughter    Healthy Son    Diabetes Other    Cancer Other     Living situation: the patient lives with their spouse  Sexual Orientation: Straight  Relationship Status: married  Name of spouse / other:unknown If a parent, number of children / ages:son 51 and 41 year old twin daughters  Support Systems: spouse Nutritional therapist Stress:  No   Income/Employment/Disability: Long-Term Research officer, political party Service: Yes   Educational History: Education: Risk manager: unknown  Any cultural differences that may affect / interfere with treatment:  N/A  Recreation/Hobbies: exercise, golf  Stressors: Health problems    Strengths: Supportive Relationships, Family, and Church  Barriers:  social withdrawal   Legal History: Pending legal issue / charges: The patient has no significant history of legal issues. History of legal issue / charges:  N/A  Medical History/Surgical History: reviewed Past Medical History:  Diagnosis Date   Anxiety    History of kidney stones    passed   PD (Parkinson's disease) (HCC)  09/10/2013   PONV (postoperative nausea and vomiting)    nausea     Past Surgical History:  Procedure Laterality Date   COLONOSCOPY     MINOR PLACEMENT OF FIDUCIAL N/A 01/31/2017   Procedure: MINOR PLACEMENT OF FIDUCIAL;  Surgeon: Ian Harman, MD;  Location: [facility] OR;  Service: Neurosurgery;  Laterality: N/A;  Fiducial placement   none     PULSE GENERATOR IMPLANT Right 02/17/2017   Procedure: Right implantable pulse generator placement;  Surgeon: Ian Harman, MD;  Location: [facility] OR;  Service: Neurosurgery;  Laterality: Right;  Bilateral implantable pulse  generator placement   SUBTHALAMIC STIMULATOR BATTERY REPLACEMENT N/A 10/19/2018   Procedure: Deep Brain stimulator implantable pulse generator revision;  Surgeon: Ian Harman, MD;  Location: [facility] OR;  Service: Neurosurgery;  Laterality: N/A;  Deep Brain stimulator implantable pulse generator revision   SUBTHALAMIC STIMULATOR INSERTION Bilateral 02/10/2017   Procedure: Bilateral Deep brain stimulator placement;  Surgeon: Ian Harman, MD;  Location: [facility] OR;  Service: Neurosurgery;  Laterality: Bilateral;  Bilateral Deep brain stimulator placement   UPPER GASTROINTESTINAL ENDOSCOPY      Medications: see above list.      Initial Session: Diagnosed with Parkinsons in 2014. He stutters and is trying to determine if his speech problems are anxiety/psychological or physical. He says he has had anxiety for a long time. Says he worries about things all the time. He says his mom had issues when he was growing up (addicted to pain meds) and that contributed to his chronic worry. He does think his anxiety is better than it was in the past. He is strongly spiritual. 49 year old son and 9 year old twin daughters. No depressive symptoms reported. Went on disability in 2014. He was on Ativan, but got off of it out of fear that it would become addicting to him. Married for 32 years and good relationship. 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 That was 2006. He was 22 years in [employer]. 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 His biggest issue currently is his speech in that by the end of the day, hard for people to understand him. Speech got bad in past 2-3 years. He goes to exercise classes for Parkinson's patients. Wife doesn't work. Strong supportive church family since 2007. Plays golf weekly and works out with Systems analyst 2 times a week. Sees kids often and just had first grandchild.  FOO: Father had  diabetes. Fraternal grandfather was alcoholic and spent time in jail. Mother dies of breast cancer later in life. Father died during pandemic at 5 (in 2021). He has a twin sister in [place] and they ar "very close". He isn't as close to some of his guys friends lately and he thinks he pulled back due to the speech issues. Hasn't shared with everyone that he has Parkinson's. He lost his closest lifelong friend in 2021. This was 3 months after his father passed.  He has tried breathing exercises to reduce his anxiety with mild results. Going for a walk or exercise helps anxiety the best. Reports sleep is good. Told him to keep an anxiety journal with rating anxiety on a 0-10 scale. Also recommended The Anxiety and Phobia Workbook. He has become phobic about public speaking.     Goals/Treatment Plan: Patient seeking counseling to reduce above named symptoms of anxiety. He is also working on adjusting to his  Parkinson's diagnosis. Hoping to improve speech issues that are related to his anxiety and regain self-confidence. Will employ behavioral strates and insight oriented psychotherapy interventions. Goal Date is 12-25.   Diagnoses:  Generalized Anxiety Disorder  Plan of Care: Outpatient Psychotherapy  Patient was seen via video session (Caregility) and is aware of the platform limitations. He is at home and I am at office. Session Note: Patient says he had a very busy weekend and didn't sleep well. Plans to play golf today and says it is exhausting for him. He says he had a good week and has been doing his meditations and relaxation exercises regularly. He wrote down the top 5 things that cause anxiety (holding it in when having bad day, being in social situation and meds are not working, having a bad experience with speech). He is exploring a device called "speech easy". It is a device that goes in the ear. He has an appointment on Feb. 28 to explore getting the device. We talked about self-talk and  that we will more deeply explore at next session. He will try to look at chapter 8 in preparation. Wants to also follow-up on meditation and deep breathing.     Garrel Ridgel, PhD 7:40a-8:30a 50 minutes                               Garrel Ridgel, PhD

## 2024-02-05 ENCOUNTER — Ambulatory Visit: Payer: Medicare Other | Admitting: Psychology

## 2024-02-05 DIAGNOSIS — F411 Generalized anxiety disorder: Secondary | ICD-10-CM

## 2024-02-05 NOTE — Progress Notes (Signed)
[facility] Counselor Initial Adult Exam  Name: Ian [name] Date: 02/05/2024 MRN: [id number] DOB: 16-Dec-1961 PCP: Tracey Harries, MD     [name] participated from home, via video, and consented to treatment. Therapist participated from home office.    Guardian/Payee:  N/A    Paperwork requested: No   Reason for Visit /Presenting Problem: chronic anxiety  Mental Status Exam: Appearance:   Casual     Behavior:  Appropriate  Motor:  Normal  Speech/Language:   Rapid cadence and moderate stutter  Affect:  Appropriate  Mood:  anxious  Thought process:  normal  Thought content:    WNL  Sensory/Perceptual disturbances:    WNL  Orientation:  oriented to person, place, and situation  Attention:  Good  Concentration:  Good  Memory:  WNL  Fund of knowledge:   Good  Insight:    Good  Judgment:   Good  Impulse Control:  Good    Reported Symptoms:  worry, phobia (public speaking), stutter, rate of speech  Risk Assessment: Danger to Self:  No Self-injurious Behavior: No Danger to Others: No Duty to Warn:no Physical Aggression / Violence:No  Access to Firearms a concern:  unknown Gang Involvement:No  Patient / guardian was educated about steps to take if suicide or homicide risk level increases between visits: n/a While future psychiatric events cannot be accurately predicted, the patient does not currently require acute inpatient psychiatric care and does not currently meet [facility] involuntary commitment criteria.  Substance Abuse History: Current substance abuse: No     Past Psychiatric History:   No previous psychological problems have been observed Outpatient Providers:N/A History of Psych Hospitalization: No  Psychological Testing:  N/A    Abuse History:  Victim of: No.,  N/A    Report needed: No. Victim of  Neglect:No. Perpetrator of  N/A   Witness / Exposure to Domestic Violence: No   Protective Services Involvement: No  Witness to [employer] Violence:  No   Family History:  Family History  Problem Relation Age of Onset   Breast cancer Mother    Diabetes Father    Healthy Sister    Healthy Daughter    Healthy Son    Diabetes Other    Cancer Other     Living situation: the patient lives with their spouse  Sexual Orientation: Straight  Relationship Status: married  Name of spouse / other:unknown If a parent, number of children / ages:son 60 and 51 year old twin daughters  Support Systems: spouse Nutritional therapist Stress:  No   Income/Employment/Disability: Long-Term Research officer, political party Service: Yes   Educational History: Education: Risk manager: unknown  Any cultural differences that may affect / interfere with treatment:  N/A  Recreation/Hobbies: exercise, golf  Stressors: Health problems    Strengths: Supportive Relationships, Family, and Church  Barriers:  social withdrawal   Legal History: Pending legal issue / charges: The patient has no significant history of legal issues. History of legal issue / charges:  N/A  Medical History/Surgical History: reviewed Past Medical History:  Diagnosis Date   Anxiety  History of kidney stones    passed   PD (Parkinson's disease) (HCC) 09/10/2013   PONV (postoperative nausea and vomiting)    nausea     Past Surgical History:  Procedure Laterality Date   COLONOSCOPY     MINOR PLACEMENT OF FIDUCIAL N/A 01/31/2017   Procedure: MINOR PLACEMENT OF FIDUCIAL;  Surgeon: Maeola Harman, MD;  Location: [facility] OR;  Service: Neurosurgery;  Laterality: N/A;  Fiducial placement   none     PULSE GENERATOR IMPLANT Right 02/17/2017   Procedure: Right implantable pulse generator placement;  Surgeon: Maeola Harman, MD;  Location: [facility] OR;  Service: Neurosurgery;  Laterality:  Right;  Bilateral implantable pulse generator placement   SUBTHALAMIC STIMULATOR BATTERY REPLACEMENT N/A 10/19/2018   Procedure: Deep Brain stimulator implantable pulse generator revision;  Surgeon: Maeola Harman, MD;  Location: [facility] OR;  Service: Neurosurgery;  Laterality: N/A;  Deep Brain stimulator implantable pulse generator revision   SUBTHALAMIC STIMULATOR INSERTION Bilateral 02/10/2017   Procedure: Bilateral Deep brain stimulator placement;  Surgeon: Maeola Harman, MD;  Location: [facility] OR;  Service: Neurosurgery;  Laterality: Bilateral;  Bilateral Deep brain stimulator placement   UPPER GASTROINTESTINAL ENDOSCOPY      Medications: see above list.      Initial Session: Diagnosed with Parkinsons in 2014. He stutters and is trying to determine if his speech problems are anxiety/psychological or physical. He says he has had anxiety for a long time. Says he worries about things all the time. He says his mom had issues when he was growing up (addicted to pain meds) and that contributed to his chronic worry. He does think his anxiety is better than it was in the past. He is strongly spiritual. 81 year old son and 48 year old twin daughters. No depressive symptoms reported. Went on disability in 2014. He was on Ativan, but got off of it out of fear that it would become addicting to him. Married for 32 years and good relationship. 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 That was 2006. He was 22 years in [employer]. 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 His biggest issue currently is his speech in that by the end of the day, hard for people to understand him. Speech got bad in past 2-3 years. He goes to exercise classes for Parkinson's patients. Wife doesn't work. Strong supportive church family since 2007. Plays golf weekly and works out with Systems analyst 2 times a week. Sees kids often and just had  first grandchild.  FOO: Father had diabetes. Fraternal grandfather was alcoholic and spent time in jail. Mother dies of breast cancer later in life. Father died during pandemic at 10 (in 2021). He has a twin sister in [place] and they ar "very close". He isn't as close to some of his guys friends lately and he thinks he pulled back due to the speech issues. Hasn't shared with everyone that he has Parkinson's. He lost his closest lifelong friend in 2021. This was 3 months after his father passed.  He has tried breathing exercises to reduce his anxiety with mild results. Going for a walk or exercise helps anxiety the best. Reports sleep is good. Told him to keep an anxiety journal with rating anxiety on a 0-10 scale. Also recommended The Anxiety and Phobia Workbook. He has become phobic about public speaking.     Goals/Treatment Plan: Patient seeking counseling to  reduce above named symptoms of anxiety. He is also working on adjusting to his Parkinson's diagnosis. Hoping to improve speech issues that are related to his anxiety and regain self-confidence. Will employ behavioral strates and insight oriented psychotherapy interventions. Goal Date is 12-25.   Diagnoses:  Generalized Anxiety Disorder  Plan of Care: Outpatient Psychotherapy  Patient was seen via video session (Caregility) and is aware of the platform limitations. He is at home and I am at office. Session Note: Patient talked about his tendency to worry since he got Parkinson's. Says his Dopamine has made it difficult. He was worried about his son who had a virus and it kept him up. He talked about a good friend who he hadn't talked to in a while and is frustrated that his friend makes no effort to get together. Has been practicing progressive muscle relaxation and finds it helpful. Reports speech problems remain about the same. Talked about being patient with hopes that he wil gradually improve. Will review chapters 9, 10, 11 in workbook.         Garrel Ridgel, PhD 7:40a-8:30a 50 minutes

## 2024-02-08 NOTE — Progress Notes (Signed)
  [name] [address] [place] [address] Phone: [phone] Subjective:   [name], am serving as a scribe for Dr. Antoine Primas.  I'm seeing this patient by the request  of:  Tracey Harries, MD  CC: Back and neck pain follow-up  [id number]  Ian [name] is a 63 y.o. male coming in with complaint of back and neck pain. OMT 12/29/2023. Patient states that L scapula is painful but otherwise he has been good.  Patient denies any radiation to any of the extremities.  Medications patient has been prescribed: None  Taking:         Reviewed prior external information including notes and imaging from previsou exam, outside providers and external EMR if available.   As well as notes that were available from care everywhere and other healthcare systems.  Past medical history, social, surgical and family history all reviewed in electronic medical record.  No pertanent information unless stated regarding to the chief complaint.   Past Medical History:  Diagnosis Date   Anxiety    History of kidney stones    passed   PD (Parkinson's disease) (HCC) 09/10/2013   PONV (postoperative nausea and vomiting)    nausea     No Known Allergies   Review of Systems:  No headache, visual changes, nausea, vomiting, diarrhea, constipation, dizziness, abdominal pain, skin rash, fevers, chills, night sweats, weight loss, swollen lymph nodes, body aches, joint swelling, chest pain, shortness of breath, mood changes. POSITIVE muscle aches  Objective  Blood pressure 110/82, height 6\' 2"  (1.88 m), weight 177 lb (80.3 kg).   General: No apparent distress alert and oriented x3 mood and affect normal, dressed appropriately.  HEENT: Pupils equal, extraocular movements intact  Respiratory: Patient's speak in full sentences and does not appear short of breath  Cardiovascular: No lower extremity edema, non tender, no erythema  Increase in hypertonicity  noted.  Low back does have some tenderness to palpation noted as well.  Tightness noted with any extension of the back noted today.  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  Discussed icing regimen and home exercise, which activities to do and which ones to avoid.  Locally responding well to osteopathic manipulation as one of the treatment modalities.  Follow-up with me again in 6 to 8 weeks otherwise    Nonallopathic problems  Decision today to treat with OMT was based on Physical Exam  After verbal consent patient was treated with HVLA, ME, FPR techniques in cervical, rib, thoracic, lumbar, and sacral  areas  Patient tolerated the procedure well with improvement in symptoms  Patient given exercises, stretches and lifestyle modifications  See medications in patient instructions if given  Patient will follow up in 4-8 weeks     The above documentation has been reviewed and is accurate and complete Judi Saa, DO         Note: This dictation was prepared with Dragon dictation along with smaller phrase technology. Any transcriptional errors that result from this process are unintentional.

## 2024-02-12 ENCOUNTER — Encounter: Payer: Self-pay | Admitting: Family Medicine

## 2024-02-12 ENCOUNTER — Ambulatory Visit (INDEPENDENT_AMBULATORY_CARE_PROVIDER_SITE_OTHER): Payer: Medicare Other | Admitting: Family Medicine

## 2024-02-12 VITALS — BP 110/82 | Ht 74.0 in | Wt 177.0 lb

## 2024-02-12 DIAGNOSIS — G20B2 Parkinson's disease with dyskinesia, with fluctuations: Secondary | ICD-10-CM

## 2024-02-12 DIAGNOSIS — M9901 Segmental and somatic dysfunction of cervical region: Secondary | ICD-10-CM | POA: Diagnosis not present

## 2024-02-12 DIAGNOSIS — M9902 Segmental and somatic dysfunction of thoracic region: Secondary | ICD-10-CM | POA: Diagnosis not present

## 2024-02-12 DIAGNOSIS — M9903 Segmental and somatic dysfunction of lumbar region: Secondary | ICD-10-CM

## 2024-02-12 DIAGNOSIS — M940 Chondrocostal junction syndrome [Tietze]: Secondary | ICD-10-CM | POA: Diagnosis not present

## 2024-02-12 DIAGNOSIS — M9904 Segmental and somatic dysfunction of sacral region: Secondary | ICD-10-CM

## 2024-02-12 DIAGNOSIS — M9908 Segmental and somatic dysfunction of rib cage: Secondary | ICD-10-CM | POA: Diagnosis not present

## 2024-02-12 NOTE — Assessment & Plan Note (Signed)
 Still secondary to Parkinson's.  Discussed icing regimen and home exercise, which activities to do and which ones to avoid.  Locally responding well to osteopathic manipulation as one of the treatment modalities.  Follow-up with me again in 6 to 8 weeks otherwise

## 2024-02-12 NOTE — Patient Instructions (Signed)
See me again in 5 weeks 

## 2024-02-12 NOTE — Assessment & Plan Note (Signed)
 Mild increase in rigidity of the knee seen previously.  Will continue to monitor.

## 2024-02-19 ENCOUNTER — Ambulatory Visit: Payer: Medicare Other | Admitting: Psychology

## 2024-02-19 DIAGNOSIS — F411 Generalized anxiety disorder: Secondary | ICD-10-CM

## 2024-02-19 NOTE — Progress Notes (Signed)
 [facility] Counselor Initial Adult Exam  Name: Ian [name] Date: 02/19/2024 MRN: [id number] DOB: 1961/08/21 PCP: Tracey Harries, MD     [name] participated from home, via video, and consented to treatment. Therapist participated from home office.    Guardian/Payee:  N/A    Paperwork requested: No   Reason for Visit /Presenting Problem: chronic anxiety  Mental Status Exam: Appearance:   Casual     Behavior:  Appropriate  Motor:  Normal  Speech/Language:   Rapid cadence and moderate stutter  Affect:  Appropriate  Mood:  anxious  Thought process:  normal  Thought content:    WNL  Sensory/Perceptual disturbances:    WNL  Orientation:  oriented to person, place, and situation  Attention:  Good  Concentration:  Good  Memory:  WNL  Fund of knowledge:   Good  Insight:    Good  Judgment:   Good  Impulse Control:  Good    Reported Symptoms:  worry, phobia (public speaking), stutter, rate of speech  Risk Assessment: Danger to Self:  No Self-injurious Behavior: No Danger to Others: No Duty to Warn:no Physical Aggression / Violence:No  Access to Firearms a concern:  unknown Gang Involvement:No  Patient / guardian was educated about steps to take if suicide or homicide risk level increases between visits: n/a While future psychiatric events cannot be accurately predicted, the patient does not currently require acute inpatient psychiatric care and does not currently meet [facility] involuntary commitment criteria.  Substance Abuse History: Current substance abuse: No     Past Psychiatric History:   No previous psychological problems have been observed Outpatient Providers:N/A History of Psych Hospitalization: No  Psychological Testing:  N/A    Abuse History:  Victim of: No.,  N/A    Report  needed: No. Victim of Neglect:No. Perpetrator of  N/A   Witness / Exposure to Domestic Violence: No   Protective Services Involvement: No  Witness to [employer] Violence:  No   Family History:  Family History  Problem Relation Age of Onset   Breast cancer Mother    Diabetes Father    Healthy Sister    Healthy Daughter    Healthy Son    Diabetes Other    Cancer Other     Living situation: the patient lives with their spouse  Sexual Orientation: Straight  Relationship Status: married  Name of spouse / other:unknown If a parent, number of children / ages:son 42 and 65 year old twin daughters  Support Systems: spouse Nutritional therapist Stress:  No   Income/Employment/Disability: Long-Term Research officer, political party Service: Yes   Educational History: Education: Risk manager: unknown  Any cultural differences that may affect / interfere with treatment:  N/A  Recreation/Hobbies: exercise, golf  Stressors: Health problems    Strengths: Supportive Relationships, Family, and Church  Barriers:  social withdrawal   Legal History: Pending legal issue / charges: The patient has no significant history of legal issues. History of legal issue / charges:  N/A  Medical History/Surgical History: reviewed Past Medical History:  Diagnosis Date   Anxiety    History of kidney stones    passed   PD (Parkinson's disease) (HCC) 09/10/2013   PONV (postoperative nausea and vomiting)    nausea     Past Surgical History:  Procedure Laterality Date   COLONOSCOPY     MINOR PLACEMENT OF FIDUCIAL N/A 01/31/2017   Procedure: MINOR PLACEMENT OF FIDUCIAL;  Surgeon: Maeola Harman, MD;  Location: [facility];  Service: Neurosurgery;  Laterality: N/A;  Fiducial placement   none     PULSE GENERATOR IMPLANT Right 02/17/2017   Procedure: Right implantable pulse generator placement;  Surgeon: Maeola Harman, MD;  Location: [facility] OR;  Service:  Neurosurgery;  Laterality: Right;  Bilateral implantable pulse generator placement   SUBTHALAMIC STIMULATOR BATTERY REPLACEMENT N/A 10/19/2018   Procedure: Deep Brain stimulator implantable pulse generator revision;  Surgeon: Maeola Harman, MD;  Location: [facility];  Service: Neurosurgery;  Laterality: N/A;  Deep Brain stimulator implantable pulse generator revision   SUBTHALAMIC STIMULATOR INSERTION Bilateral 02/10/2017   Procedure: Bilateral Deep brain stimulator placement;  Surgeon: Maeola Harman, MD;  [facility];  Service: Neurosurgery;  Laterality: Bilateral;  Bilateral Deep brain stimulator placement   UPPER GASTROINTESTINAL ENDOSCOPY      Medications: see above list.      Initial Session: Diagnosed with Parkinsons in 2014. He stutters and is trying to determine if his speech problems are anxiety/psychological or physical. He says he has had anxiety for a long time. Says he worries about things all the time. He says his mom had issues when he was growing up (addicted to pain meds) and that contributed to his chronic worry. He does think his anxiety is better than it was in the past. He is strongly spiritual. 27 year old son and 23 year old twin daughters. No depressive symptoms reported. Went on disability in 2014. He was on Ativan, but got off of it out of fear that it would become addicting to him. Married for 32 years and good relationship. 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 That was 2006. He was 22 years in [employer]. 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 His biggest issue currently is his speech in that by the end of the day, hard for people to understand him. Speech got bad in past 2-3 years. He goes to exercise classes for Parkinson's patients. Wife doesn't work. Strong supportive church family since 2007. Plays golf weekly and works out with Systems analyst 2 times a week. Sees  kids often and just had first grandchild.  FOO: Father had diabetes. Fraternal grandfather was alcoholic and spent time in jail. Mother dies of breast cancer later in life. Father died during pandemic at 21 (in 2021). He has a twin sister in [place] and they ar "very close". He isn't as close to some of his guys friends lately and he thinks he pulled back due to the speech issues. Hasn't shared with everyone that he has Parkinson's. He lost his closest lifelong friend in 2021. This was 3 months after his father passed.  He has tried breathing exercises to reduce his anxiety with mild results. Going for a walk or exercise helps anxiety the best. Reports sleep is good. Told him to keep an anxiety journal with rating anxiety on a 0-10 scale. Also recommended The Anxiety and Phobia Workbook. He  has become phobic about public speaking.     Goals/Treatment Plan: Patient seeking counseling to reduce above named symptoms of anxiety. He is also working on adjusting to his Parkinson's diagnosis. Hoping to improve speech issues that are related to his anxiety and regain self-confidence. Will employ behavioral strates and insight oriented psychotherapy interventions. Goal Date is 12-25.   Diagnoses:  Generalized Anxiety Disorder  Plan of Care: Outpatient Psychotherapy  Patient was seen via video session (Caregility) and is aware of the platform limitations. He is at home and I am at office. Session Note: Patient says he played golf last week and he has been exercising regularly. He [facility] on [facility] and it went "surprisingly well". Got assigned to a primary care doctor. Had to access [facility] system in order to get into the Speech Easy program and paid for by the [place]. He hopes to sign up this week with the speech program. It is a new device and he will be able to know quickly if the device works. 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 Trying to recognize that he tends to embellish  problems and that he needs to take a more realistic approach. Will contimue to incporporate both the physical (mechanical) with the various mindfulness techniques we have discussed.           Garrel Ridgel, PhD 7:40a-8:30a 50 minutes

## 2024-02-23 ENCOUNTER — Encounter: Payer: Self-pay | Admitting: Neurology

## 2024-02-28 NOTE — Therapy (Signed)
 [facility] [address] [place], [place], [address] Phone: [phone]   Fax:  [phone]  Patient Details  Name: Ian [name] MRN: [id number] Date of Birth: May 09, 1961 Referring Provider:  Tracey Harries, MD  Encounter Date: 02/29/2024  Occupational Therapy Parkinson's Disease Screen  Pt reports he hasn't had his PD meds yet this morning  Hand dominance:  Rt   Physical Performance Test item #2 (simulated eating):  10.22 sec (improved)  Physical Performance Test item #4 (donning/doffing jacket):  7 sec (maintained)  Fastening/unfastening 3 buttons in:  14.67 sec (maintained)  9-hole peg test:    RUE  28.27 sec  (slightly slower)      LUE  29.02 sec (slower)  Change in ability to perform ADLs/IADLs:  slightly longer when he's tired, otherwise no changes  Other Comments:  BUE AROM WFL's, handwriting ok if he takes his time   Pt does not require occupational therapy services at this time.  Recommended occupational therapy screen in  6 months. Pt/therapist discussed possibility of picking up for short amount of therapy at that time since he has not had O.T. for a while     Sheran Lawless, OT 02/28/2024, 3:46 PM  [facility] [address] [place], [place], [address] Phone: [phone]   Fax:  [phone]

## 2024-02-29 ENCOUNTER — Ambulatory Visit: Payer: Medicare Other | Admitting: Physical Therapy

## 2024-02-29 ENCOUNTER — Ambulatory Visit: Payer: Medicare Other | Admitting: Speech Pathology

## 2024-02-29 ENCOUNTER — Ambulatory Visit: Payer: Medicare Other | Attending: Neurology | Admitting: Occupational Therapy

## 2024-02-29 DIAGNOSIS — R2681 Unsteadiness on feet: Secondary | ICD-10-CM | POA: Insufficient documentation

## 2024-02-29 DIAGNOSIS — R471 Dysarthria and anarthria: Secondary | ICD-10-CM

## 2024-02-29 DIAGNOSIS — R29818 Other symptoms and signs involving the nervous system: Secondary | ICD-10-CM | POA: Insufficient documentation

## 2024-02-29 NOTE — Therapy (Signed)
 [facility] [address] [place], [place], [address] Phone: [phone]   Fax:  [phone]  Patient Details  Name: Nivin Braniff MRN: [id number] Date of Birth: 07/28/1961 Referring Provider:  Vladimir Faster, DO  Encounter Date: 02/29/2024  Speech Therapy Parkinson's Disease Screen   Pt completed ST course targeting motor speech and swallow deficits, with discharge November 14, 2023. Per d/c documentation, pt with intent to f/u with [facility] and mental health professional, and modification of DBS settings. Per pt report, DBS stimulation is very helpful for PT and OT however impacting communicative success by increasing rate of speech. Pt reports unable to obtain SpeakEasy device d/t [facility] report they are not in network with the company. Pt reports he is actively working to achieve referral and SLP plans to look into program, determine whether OP clinic can obtain referral and next steps for patient in this process.   Decibel Level today: WNL=70-72 dB   Pt does not report difficulty with swallowing, which does not warrant further evaluation  Pt may benefit speech therapy services if able to obtain [facility] device. Will defer at this time.   Maia Breslow, CCC-SLP 02/29/2024, 8:06 AM  [facility] [address] [place], [place], [address] Phone: [phone]   Fax:  [phone]

## 2024-02-29 NOTE — Therapy (Signed)
 [facility] [address] [place], [place], [address] Phone: [phone]   Fax:  [phone]  Patient Details  Name: Karrington Studnicka MRN: [id number] Date of Birth: Apr 29, 1961 Referring Provider:  Vladimir Faster, DO  Encounter Date: 02/29/2024  Physical Therapy Parkinson's Disease Screen   Timed Up and Go test: 6.3 seconds (previously 6.5 seconds)   10 meter walk test:7 seconds = 4.68 ft/sec (previously  7 seconds = 4.68 ft/sec)  5 time sit to stand test: 8.4 seconds  (previously 8.6 seconds)  Notes balance and walking is feeling pretty good, no changes. No falls. No freezing episodes. Doing PWR classes, spin classes, Tai golf, personal training, and golf.    Patient does not require Physical Therapy services at this time.  Recommend Physical Therapy screen in 6 months.    Drake Leach, PT, DPT 02/29/2024, 8:25 AM  [facility] [address] [place], [place], [address] Phone: [phone]   Fax:  [phone]

## 2024-03-04 ENCOUNTER — Ambulatory Visit (INDEPENDENT_AMBULATORY_CARE_PROVIDER_SITE_OTHER): Payer: Medicare Other | Admitting: Psychology

## 2024-03-04 DIAGNOSIS — F411 Generalized anxiety disorder: Secondary | ICD-10-CM | POA: Diagnosis not present

## 2024-03-04 NOTE — Progress Notes (Signed)
 [facility] Counselor Initial Adult Exam  Name: Ian [name] Date: 03/04/2024 MRN: [id number] DOB: Sep 15, 1961 PCP: Tracey Harries, MD     Ian [name] participated from home, via video, and consented to treatment. Therapist participated from home office.    Guardian/Payee:  N/A    Paperwork requested: No   Reason for Visit /Presenting Problem: chronic anxiety  Mental Status Exam: Appearance:   Casual     Behavior:  Appropriate  Motor:  Normal  Speech/Language:   Rapid cadence and moderate stutter  Affect:  Appropriate  Mood:  anxious  Thought process:  normal  Thought content:    WNL  Sensory/Perceptual disturbances:    WNL  Orientation:  oriented to person, place, and situation  Attention:  Good  Concentration:  Good  Memory:  WNL  Fund of knowledge:   Good  Insight:    Good  Judgment:   Good  Impulse Control:  Good    Reported Symptoms:  worry, phobia (public speaking), stutter, rate of speech  Risk Assessment: Danger to Self:  No Self-injurious Behavior: No Danger to Others: No Duty to Warn:no Physical Aggression / Violence:No  Access to Firearms a concern:  unknown Gang Involvement:No  Patient / guardian was educated about steps to take if suicide or homicide risk level increases between visits: n/a While future psychiatric events cannot be accurately predicted, the patient does not currently require acute inpatient psychiatric care and does not currently meet [facility] involuntary commitment criteria.  Substance Abuse History: Current substance abuse: No     Past Psychiatric History:   No previous psychological problems have been observed Outpatient Providers:N/A History of Psych Hospitalization: No  Psychological Testing:  N/A    Abuse History:   Victim of: No.,  N/A    Report needed: No. Victim of Neglect:No. Perpetrator of  N/A   Witness / Exposure to Domestic Violence: No   Protective Services Involvement: No  Witness to [employer] Violence:  No   Family History:  Family History  Problem Relation Age of Onset   Breast cancer Mother    Diabetes Father    Healthy Sister    Healthy Daughter    Healthy Son    Diabetes Other    Cancer Other     Living situation: the patient lives with their spouse  Sexual Orientation: Straight  Relationship Status: married  Name of spouse / other:unknown If a parent, number of children / ages:son 63 and 83 year old twin daughters  Support Systems: spouse Nutritional therapist Stress:  No   Income/Employment/Disability: Long-Term Research officer, political party Service: Yes   Educational History: Education: Risk manager: unknown  Any cultural differences that may affect / interfere with treatment:  N/A  Recreation/Hobbies: exercise, golf  Stressors: Health problems    Strengths: Supportive Relationships, Family, and Church  Barriers:  social withdrawal   Legal History: Pending legal issue / charges: The patient has  no significant history of legal issues. History of legal issue / charges:  N/A  Medical History/Surgical History: reviewed Past Medical History:  Diagnosis Date   Anxiety    History of kidney stones    passed   PD (Parkinson's disease) (HCC) 09/10/2013   PONV (postoperative nausea and vomiting)    nausea     Past Surgical History:  Procedure Laterality Date   COLONOSCOPY     MINOR PLACEMENT OF FIDUCIAL N/A 01/31/2017   Procedure: MINOR PLACEMENT OF FIDUCIAL;  Surgeon: Maeola Harman, MD;  [facility]: [facility] OR;  Service: Neurosurgery;  Laterality: N/A;  Fiducial placement   none     PULSE GENERATOR IMPLANT Right 02/17/2017   Procedure: Right implantable pulse generator placement;  Surgeon: Maeola Harman, MD;   Location: [facility] OR;  Service: Neurosurgery;  Laterality: Right;  Bilateral implantable pulse generator placement   SUBTHALAMIC STIMULATOR BATTERY REPLACEMENT N/A 10/19/2018   Procedure: Deep Brain stimulator implantable pulse generator revision;  Surgeon: Maeola Harman, MD;  Location: [facility] OR;  Service: Neurosurgery;  Laterality: N/A;  Deep Brain stimulator implantable pulse generator revision   SUBTHALAMIC STIMULATOR INSERTION Bilateral 02/10/2017   Procedure: Bilateral Deep brain stimulator placement;  Surgeon: Maeola Harman, MD;  Location: [facility] OR;  Service: Neurosurgery;  Laterality: Bilateral;  Bilateral Deep brain stimulator placement   UPPER GASTROINTESTINAL ENDOSCOPY      Medications: see above list.      Initial Session: Diagnosed with Parkinsons in 2014. He stutters and is trying to determine if his speech problems are anxiety/psychological or physical. He says he has had anxiety for a long time. Says he worries about things all the time. He says his mom had issues when he was growing up (addicted to pain meds) and that contributed to his chronic worry. He does think his anxiety is better than it was in the past. He is strongly spiritual. 83 year old son and 24 year old twin daughters. No depressive symptoms reported. Went on disability in 2014. He was on Ativan, but got off of it out of fear that it would become addicting to him. Married for 32 years and good relationship. 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 That was 2006. He was 22 years in [employer]. 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 His biggest issue currently is his speech in that by the end of the day, hard for people to understand him. Speech got bad in past 2-3 years. He goes to exercise classes for Parkinson's patients. Wife doesn't work. Strong supportive church family since 2007. Plays golf weekly and works out with Patent examiner 2 times a week. Sees kids often and just had first grandchild.  FOO: Father had diabetes. Fraternal grandfather was alcoholic and spent time in jail. Mother dies of breast cancer later in life. Father died during pandemic at 22 (in 2021). He has a twin sister in [place] and they ar "very close". He isn't as close to some of his guys friends lately and he thinks he pulled back due to the speech issues. Hasn't shared with everyone that he has Parkinson's. He lost his closest lifelong friend in 2021. This was 3 months after his father passed.  He has tried breathing exercises to reduce his anxiety with mild results. Going for a walk or exercise helps anxiety the best. Reports sleep is good. Told him to keep an anxiety journal  with rating anxiety on a 0-10 scale. Also recommended The Anxiety and Phobia Workbook. He has become phobic about public speaking.     Goals/Treatment Plan: Patient seeking counseling to reduce above named symptoms of anxiety. He is also working on adjusting to his Parkinson's diagnosis. Hoping to improve speech issues that are related to his anxiety and regain self-confidence. Will employ behavioral strates and insight oriented psychotherapy interventions. Goal Date is 12-25.   Diagnoses:  Generalized Anxiety Disorder  Plan of Care: Outpatient Psychotherapy  Patient was seen via video session (Caregility) and is aware of the platform limitations. He is at home and I am at office. Session Note: Patient says he did not sleep well last night. Had a challenging week. Some good things included his PT and speech evaluations. Great scores on PT and OT tests. The speech frustration is that the person he needed to speak with about getting the speech device is out of network. Still hoping to get the device for his speech and will hopefully hear this week. 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 We talked about his history of worry, going back to the age of  [age] or [age]. His mother got addicted to pain medication and it created a difficult marital situation for his parents. His mother died at 74 from cancer. 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 He is working on deep breathing and meditation. He is practicing and says he is getting better. His daughter and her husband want to live with Ian [name] and his wife while they determine their next step for housing. Feeling optimistic about all the resources available to him.              Garrel Ridgel, PhD 7:40a-8:30a 50 minutes

## 2024-03-14 NOTE — Progress Notes (Signed)
  [name] [address] [place] [address] Phone: [phone] Subjective:   Ian [name], am serving as a scribe for Dr. Antoine Primas.  I'm seeing this patient by the request  of:  Tracey Harries, MD  CC: Back and neck pain follow-up  [id number]  Ian [name] is a 63 y.o. male coming in with complaint of back and neck pain. OMT 02/12/2024.  Past medical history significant for Parkinson's disease.  Patient states still having pain  Medications patient has been prescribed: None  Taking:         Reviewed prior external information including notes and imaging from previsou exam, outside providers and external EMR if available.   As well as notes that were available from care everywhere and other healthcare systems.  Past medical history, social, surgical and family history all reviewed in electronic medical record.  No pertanent information unless stated regarding to the chief complaint.   Past Medical History:  Diagnosis Date   Anxiety    History of kidney stones    passed   PD (Parkinson's disease) (HCC) 09/10/2013   PONV (postoperative nausea and vomiting)    nausea     No Known Allergies   Review of Systems:  No headache, visual changes, nausea, vomiting, diarrhea, constipation, dizziness, abdominal pain, skin rash, fevers, chills, night sweats, weight loss, swollen lymph nodes, body aches, joint swelling, chest pain, shortness of breath, mood changes. POSITIVE muscle aches  Objective  Blood pressure 122/78, pulse 68, height 6\' 2"  (1.88 m), weight 176 lb (79.8 kg), SpO2 98%.   General: No apparent distress alert and oriented x3 mood and affect normal, dressed appropriately.  HEENT: Pupils equal, extraocular movements intact  Respiratory: Patient's speak in full sentences and does not appear short of breath  Cardiovascular: No lower extremity edema, non tender, no erythema  Gait normal  MSK:  Back does have loss of  lordosis   Osteopathic findings  C2 flexed rotated and side bent right C7 flexed rotated and side bent left T3 extended rotated and side bent right inhaled rib T7 extended rotated and side bent left L2 flexed rotated and side bent right L5 flexed rotated and side bent left Sacrum right on right     Assessment and Plan:  Slipped rib syndrome Discussed which activities to do and which ones to avoid.  Increase activity slowly.  Discussed icing regimen and home exercises.  Patient has been doing great and is staying active so he can be highly active with his daughter as well.  Follow-up with me again in 6 to 8 weeks otherwise.    Nonallopathic problems  Decision today to treat with OMT was based on Physical Exam  After verbal consent patient was treated with HVLA, ME, FPR techniques in cervical, rib, thoracic, lumbar, and sacral  areas  Patient tolerated the procedure well with improvement in symptoms  Patient given exercises, stretches and lifestyle modifications  See medications in patient instructions if given  Patient will follow up in 4-8 weeks    The above documentation has been reviewed and is accurate and complete Judi Saa, DO          Note: This dictation was prepared with Dragon dictation along with smaller phrase technology. Any transcriptional errors that result from this process are unintentional.

## 2024-03-18 ENCOUNTER — Ambulatory Visit (INDEPENDENT_AMBULATORY_CARE_PROVIDER_SITE_OTHER): Payer: Medicare Other | Admitting: Psychology

## 2024-03-18 DIAGNOSIS — F411 Generalized anxiety disorder: Secondary | ICD-10-CM | POA: Diagnosis not present

## 2024-03-18 NOTE — Progress Notes (Signed)
 [facility] Counselor Initial Adult Exam  Name: Ian [name] Date: 03/18/2024 MRN: [id number] DOB: 06/14/1961 PCP: Ian Harries, MD     [name] participated from home, via video, and consented to treatment. Therapist participated from home office.    Guardian/Payee:  N/A    Paperwork requested: No   Reason for Visit /Presenting Problem: chronic anxiety  Mental Status Exam: Appearance:   Casual     Behavior:  Appropriate  Motor:  Normal  Speech/Language:   Rapid cadence and moderate stutter  Affect:  Appropriate  Mood:  anxious  Thought process:  normal  Thought content:    WNL  Sensory/Perceptual disturbances:    WNL  Orientation:  oriented to person, place, and situation  Attention:  Good  Concentration:  Good  Memory:  WNL  Fund of knowledge:   Good  Insight:    Good  Judgment:   Good  Impulse Control:  Good    Reported Symptoms:  worry, phobia (public speaking), stutter, rate of speech  Risk Assessment: Danger to Self:  No Self-injurious Behavior: No Danger to Others: No Duty to Warn:no Physical Aggression / Violence:No  Access to Firearms a concern:  unknown Gang Involvement:No  Patient / guardian was educated about steps to take if suicide or homicide risk level increases between visits: n/a While future psychiatric events cannot be accurately predicted, the patient does not currently require acute inpatient psychiatric care and does not currently meet [facility] involuntary commitment criteria.  Substance Abuse History: Current substance abuse: No     Past Psychiatric History:   No previous psychological problems have been observed Outpatient Providers:N/A History of Psych Hospitalization: No  Psychological Testing:   N/A    Abuse History:  Victim of: No.,  N/A    Report needed: No. Victim of Neglect:No. Perpetrator of  N/A   Witness / Exposure to Domestic Violence: No   Protective Services Involvement: No  Witness to [employer] Violence:  No   Family History:  Family History  Problem Relation Age of Onset   Breast cancer Mother    Diabetes Father    Healthy Sister    Healthy Daughter    Healthy Son    Diabetes Other    Cancer Other     Living situation: the patient lives with their spouse  Sexual Orientation: Straight  Relationship Status: married  Name of spouse / other:unknown If a parent, number of children / ages:son 81 and 58 year old twin daughters  Support Systems: spouse Nutritional therapist Stress:  No   Income/Employment/Disability: Long-Term Research officer, political party Service: Yes   Educational History: Education: Risk manager: unknown  Any cultural differences that may affect / interfere with treatment:  N/A  Recreation/Hobbies: exercise, golf  Stressors: Health problems    Strengths: Supportive Relationships, Family, and Church  Barriers:  social withdrawal   Legal History: Pending legal issue / charges: The patient has no significant history of legal issues. History of legal issue / charges:  N/A  Medical History/Surgical History: reviewed Past Medical History:  Diagnosis Date   Anxiety    History of kidney stones    passed   PD (Parkinson's disease) (HCC) 09/10/2013   PONV (postoperative nausea and vomiting)    nausea     Past Surgical History:  Procedure Laterality Date   COLONOSCOPY     MINOR PLACEMENT OF FIDUCIAL N/A 01/31/2017   Procedure: MINOR PLACEMENT OF FIDUCIAL;  Surgeon: Maeola Harman, MD;  Location: [facility] OR;  Service: Neurosurgery;  Laterality: N/A;  Fiducial placement   none     PULSE GENERATOR IMPLANT Right 02/17/2017   Procedure: Right implantable pulse generator placement;   Surgeon: Maeola Harman, MD;  Location: [facility] OR;  Service: Neurosurgery;  Laterality: Right;  Bilateral implantable pulse generator placement   SUBTHALAMIC STIMULATOR BATTERY REPLACEMENT N/A 10/19/2018   Procedure: Deep Brain stimulator implantable pulse generator revision;  Surgeon: Maeola Harman, MD;  Location: [facility] OR;  Service: Neurosurgery;  Laterality: N/A;  Deep Brain stimulator implantable pulse generator revision   SUBTHALAMIC STIMULATOR INSERTION Bilateral 02/10/2017   Procedure: Bilateral Deep brain stimulator placement;  Surgeon: Maeola Harman, MD;  Location[facility] OR;  Service: Neurosurgery;  Laterality: Bilateral;  Bilateral Deep brain stimulator placement   UPPER GASTROINTESTINAL ENDOSCOPY      Medications: see above list.      Initial Session: Diagnosed with Parkinsons in 2014. He stutters and is trying to determine if his speech problems are anxiety/psychological or physical. He says he has had anxiety for a long time. Says he worries about things all the time. He says his mom had issues when he was growing up (addicted to pain meds) and that contributed to his chronic worry. He does think his anxiety is better than it was in the past. He is strongly spiritual. 88 year old son and 62 year old twin daughters. No depressive symptoms reported. Went on disability in 2014. He was on Ativan, but got off of it out of fear that it would become addicting to him. Married for 32 years and good relationship. 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 That was 2006. He was 22 years in [employer]. 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 His biggest issue currently is his speech in that by the end of the day, hard for people to understand him. Speech got bad in past 2-3 years. He goes to exercise classes for Parkinson's patients. Wife doesn't work. Strong supportive church family since 2007. Plays golf weekly  and works out with Systems analyst 2 times a week. Sees kids often and just had first grandchild.  FOO: Father had diabetes. Fraternal grandfather was alcoholic and spent time in jail. Mother dies of breast cancer later in life. Father died during pandemic at 46 (in 2021). He has a twin sister in [place] and they ar "very close". He isn't as close to some of his guys friends lately and he thinks he pulled back due to the speech issues. Hasn't shared with everyone that he has Parkinson's. He lost his closest lifelong friend in 2021. This was 3 months after his father passed.  He has tried breathing exercises to reduce his anxiety with mild results. Going for a walk or exercise helps  anxiety the best. Reports sleep is good. Told him to keep an anxiety journal with rating anxiety on a 0-10 scale. Also recommended The Anxiety and Phobia Workbook. He has become phobic about public speaking.     Goals/Treatment Plan: Patient seeking counseling to reduce above named symptoms of anxiety. He is also working on adjusting to his Parkinson's diagnosis. Hoping to improve speech issues that are related to his anxiety and regain self-confidence. Will employ behavioral strates and insight oriented psychotherapy interventions. Goal Date is 12-25.   Diagnoses:  Generalized Anxiety Disorder  Plan of Care: Outpatient Psychotherapy  Patient was seen via video session (Caregility) and is aware of the platform limitations. He is at home and I am at office. Session Note: Patient says that his daughter and her family has moved in with them. Will stay with them until they decide next steps. So far it has been fine. He is not getting a response from the [place] about getting approved for the speech device. He does say that his sleep is improved since our last session. He is staying very busy and therefore distracted and less worried. Part of the struggle is that he feels better when he takes meds, but his speech is worse. Needs  to find the right balance.  He does have some issues waking up and not being able to get back to sleep.  We talked about the technique of "cognitive shuffling". He has appointment with Dr. Arbutus Leas in April.                  Garrel Ridgel, PhD 7:40a-8:30a 50 minutes

## 2024-03-19 ENCOUNTER — Encounter: Payer: Self-pay | Admitting: Family Medicine

## 2024-03-19 ENCOUNTER — Ambulatory Visit (INDEPENDENT_AMBULATORY_CARE_PROVIDER_SITE_OTHER): Payer: Medicare Other | Admitting: Family Medicine

## 2024-03-19 VITALS — BP 122/78 | HR 68 | Ht 74.0 in | Wt 176.0 lb

## 2024-03-19 DIAGNOSIS — M9908 Segmental and somatic dysfunction of rib cage: Secondary | ICD-10-CM | POA: Diagnosis not present

## 2024-03-19 DIAGNOSIS — M9902 Segmental and somatic dysfunction of thoracic region: Secondary | ICD-10-CM | POA: Diagnosis not present

## 2024-03-19 DIAGNOSIS — M9904 Segmental and somatic dysfunction of sacral region: Secondary | ICD-10-CM | POA: Diagnosis not present

## 2024-03-19 DIAGNOSIS — M940 Chondrocostal junction syndrome [Tietze]: Secondary | ICD-10-CM

## 2024-03-19 DIAGNOSIS — M9901 Segmental and somatic dysfunction of cervical region: Secondary | ICD-10-CM

## 2024-03-19 DIAGNOSIS — M9903 Segmental and somatic dysfunction of lumbar region: Secondary | ICD-10-CM

## 2024-03-19 NOTE — Patient Instructions (Signed)
 Great to see you Enjoy the grandbabies See me in 6 weeks

## 2024-03-19 NOTE — Assessment & Plan Note (Signed)
 Discussed which activities to do and which ones to avoid.  Increase activity slowly.  Discussed icing regimen and home exercises.  Patient has been doing great and is staying active so he can be highly active with his daughter as well.  Follow-up with me again in 6 to 8 weeks otherwise.

## 2024-04-01 ENCOUNTER — Ambulatory Visit: Payer: Medicare Other | Admitting: Psychology

## 2024-04-02 ENCOUNTER — Encounter: Payer: Self-pay | Admitting: Neurology

## 2024-04-02 ENCOUNTER — Ambulatory Visit (INDEPENDENT_AMBULATORY_CARE_PROVIDER_SITE_OTHER): Admitting: Psychology

## 2024-04-02 DIAGNOSIS — F411 Generalized anxiety disorder: Secondary | ICD-10-CM | POA: Diagnosis not present

## 2024-04-02 NOTE — Progress Notes (Signed)
 [facility] Counselor Initial Adult Exam  Name: Ian [name] Date: 04/02/2024 MRN: [id number] DOB: 1961/06/04 PCP: Alfredia Ina, MD     [name] participated from home, via video, and consented to treatment. Therapist participated from home office.    Guardian/Payee:  N/A    Paperwork requested: No   Reason for Visit /Presenting Problem: chronic anxiety  Mental Status Exam: Appearance:   Casual     Behavior:  Appropriate  Motor:  Normal  Speech/Language:   Rapid cadence and moderate stutter  Affect:  Appropriate  Mood:  anxious  Thought process:  normal  Thought content:    WNL  Sensory/Perceptual disturbances:    WNL  Orientation:  oriented to person, place, and situation  Attention:  Good  Concentration:  Good  Memory:  WNL  Fund of knowledge:   Good  Insight:    Good  Judgment:   Good  Impulse Control:  Good    Reported Symptoms:  worry, phobia (public speaking), stutter, rate of speech  Risk Assessment: Danger to Self:  No Self-injurious Behavior: No Danger to Others: No Duty to Warn:no Physical Aggression / Violence:No  Access to Firearms a concern:  unknown Gang Involvement:No  Patient / guardian was educated about steps to take if suicide or homicide risk level increases between visits: n/a While future psychiatric events cannot be accurately predicted, the patient does not currently require acute inpatient psychiatric care and does not currently meet [place]  involuntary commitment criteria.  Substance Abuse History: Current substance abuse: No     Past Psychiatric History:   No previous psychological problems have been observed Outpatient Providers:N/A History of Psych Hospitalization:  No  Psychological Testing:  N/A    Abuse History:  Victim of: No.,  N/A    Report needed: No. Victim of Neglect:No. Perpetrator of  N/A   Witness / Exposure to Domestic Violence: No   Protective Services Involvement: No  Witness to [employer] Violence:  No   Family History:  Family History  Problem Relation Age of Onset   Breast cancer Mother    Diabetes Father    Healthy Sister    Healthy Daughter    Healthy Son    Diabetes Other    Cancer Other     Living situation: the patient lives with their spouse  Sexual Orientation: Straight  Relationship Status: married  Name of spouse / other:unknown If a parent, number of children / ages:son 24 and 54 year old twin daughters  Support Systems: spouse Nutritional therapist Stress:  No   Income/Employment/Disability: Long-Term Research officer, political party Service: Yes   Educational History: Education: Risk manager: unknown  Any cultural differences that may affect / interfere with treatment:  N/A  Recreation/Hobbies: exercise, golf  Stressors: Health problems    Strengths: Supportive Relationships, Family, and Church  Barriers:  social withdrawal   Legal History: Pending legal issue / charges: The patient has no significant history of legal issues. History of legal issue / charges:  N/A  Medical History/Surgical History: reviewed Past Medical History:  Diagnosis Date   Anxiety    History of kidney stones    passed   PD (Parkinson's disease) (HCC) 09/10/2013   PONV (postoperative nausea and vomiting)    nausea     Past Surgical History:  Procedure Laterality Date   COLONOSCOPY     MINOR PLACEMENT OF FIDUCIAL N/A 01/31/2017   Procedure: MINOR PLACEMENT OF FIDUCIAL;  Surgeon: Manya Sells, MD;  Location: [facility] OR;  Service: Neurosurgery;  Laterality: N/A;  Fiducial placement   none     PULSE GENERATOR IMPLANT Right 02/17/2017   Procedure: Right implantable  pulse generator placement;  Surgeon: Manya Sells, MD;  Location: [facility] OR;  Service: Neurosurgery;  Laterality: Right;  Bilateral implantable pulse generator placement   SUBTHALAMIC STIMULATOR BATTERY REPLACEMENT N/A 10/19/2018   Procedure: Deep Brain stimulator implantable pulse generator revision;  Surgeon: Manya Sells, MD;  Location: [facility] OR;  Service: Neurosurgery;  Laterality: N/A;  Deep Brain stimulator implantable pulse generator revision   SUBTHALAMIC STIMULATOR INSERTION Bilateral 02/10/2017   Procedure: Bilateral Deep brain stimulator placement;  Surgeon: Manya Sells, MD;  Location: [facility] OR;  Service: Neurosurgery;  Laterality: Bilateral;  Bilateral Deep brain stimulator placement   UPPER GASTROINTESTINAL ENDOSCOPY      Medications: see above list.      Initial Session: Diagnosed with Parkinsons in 2014. He stutters and is trying to determine if his speech problems are anxiety/psychological or physical. He says he has had anxiety for a long time. Says he worries about things all the time. He says his mom had issues when he was growing up (addicted to pain meds) and that contributed to his chronic worry. He does think his anxiety is better than it was in the past. He is strongly spiritual. 94 year old son and 38 year old twin daughters. No depressive symptoms reported. Went on disability in 2014. He was on Ativan, but got off of it out of fear that it would become addicting to him. Married for 32 years and good relationship. 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 That was 2006. He was 22 years in [employer]. 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 His biggest issue currently is his speech in that by the end of the day, hard for people to understand him. Speech got bad in past 2-3 years. He goes to exercise classes for Parkinson's patients. Wife doesn't work. Strong supportive church family  since 2007. Plays golf weekly and works out with Systems analyst 2 times a week. Sees kids often and just had first grandchild.  FOO: Father had diabetes. Fraternal grandfather was alcoholic and spent time in jail. Mother dies of breast cancer later in life. Father died during pandemic at 65 (in 2021). He has a twin sister in [place] and they ar "very close". He isn't as close to some of his guys friends lately and he thinks he pulled back due to the speech issues. Hasn't shared with everyone that he has Parkinson's. He lost his closest lifelong friend in 2021. This was 3 months after his father passed.  He has tried breathing  exercises to reduce his anxiety with mild results. Going for a walk or exercise helps anxiety the best. Reports sleep is good. Told him to keep an anxiety journal with rating anxiety on a 0-10 scale. Also recommended The Anxiety and Phobia Workbook. He has become phobic about public speaking.     Goals/Treatment Plan: Patient seeking counseling to reduce above named symptoms of anxiety. He is also working on adjusting to his Parkinson's diagnosis. Hoping to improve speech issues that are related to his anxiety and regain self-confidence. Will employ behavioral strates and insight oriented psychotherapy interventions. Goal Date is 12-25.   Diagnoses:  Generalized Anxiety Disorder  Plan of Care: Outpatient Psychotherapy  Patient was seen via video session (Caregility) and is aware of the platform limitations. He is at home and I am at office. Session Note: Patient says he is frustrated that he has not heard back from the speech pathologist at the [facility] about approval for the device. Says that his daughter living at home with her family is working well. Meets with Dr. Winferd Hatter on Monday and hopes to have some discussion about settings changes to possibly help his speech. He says fatigue is up and more speech problems. Says the busier he is, the less fatigue he has. We discussed being  more open about his Parkinson's with friends so that they can better understand why he is quiet and even withdrawn at times. He will make that effort. Remains hopeful for some good news from [place] and Dr. Winferd Hatter as well.                     Jola Nash, PhD 7:40a-8:30a 50 minutes

## 2024-04-08 ENCOUNTER — Encounter: Payer: Medicare Other | Admitting: Neurology

## 2024-04-09 ENCOUNTER — Encounter: Payer: Self-pay | Admitting: Neurology

## 2024-04-09 ENCOUNTER — Telehealth: Payer: Self-pay | Admitting: Neurology

## 2024-04-09 NOTE — Telephone Encounter (Signed)
 Pt brought in disability forms to be filled out. He included a copy of last years. He would like to pick them up once completed. Forms are in Dr. Jamel Mc box

## 2024-04-10 NOTE — Progress Notes (Signed)
 Assessment/Plan:   1.  Parkinsons Disease  -The patient underwent bilateral STN DBS with [employer] device on 02/10/2017 and had IPG placement on 02/17/2017.    He did have his battery changed on October 19, 2018 to an MRI compatible battery.             -continue rytary  95 mg, 2 po qid.  Discussed crexont but I'm not sure how valuable this is to him  -stop amantadine .  -start gocovri  137 mg, 1 po q hs x 1 week and then 2 po q hs  -We discussed Vyalev, which is foscarbidopa/foslevodopa pump that is newly FDA approved.  We discussed that it is for motor fluctuations in adults with advanced Parkinson's disease.  We discussed that this likely will not be on Medicare formulary until the latter half of 2025.  We discussed risks and benefits of this drug.   - We worked quite a bit with his DBS today, turning the sides on and off independently.  It is possible that the left brain is affecting the speech some and we ended up trialing various settings, but ultimately ended up just turning it back some after working with the device for some time.   2.  Fatigue              He exercises.  He has tried Nuvigil , Provigil , selegiline  all without relief.  He finds that this seems to be one of his "off" symptoms.  3.  REM behavior disorder.             -On clonazepam  0.5 mg, half tablet at night.  PDMP has been reviewed.  No red flags.  This was just filled a few days ago. 4.  GAD  -following with Dr. Gutterman and finding it useful.  Subjective:   Ian [name] was seen today in follow up for Parkinsons disease.  My previous records were reviewed prior to todays visit as well as outside records available to me.  Patient is now following with Dr. Gutterman.  Patient did have CT brain since our last visit to emerge up to his preop MRI.  We merged this into his programming system through [employer].  Patient does state that Parkinsons symptoms are fairly stable as long as medications are working.  He  has a little bit more off time.  Off time consists of fatigue, slowness and tremors in the feet.  Speech continues to be troublesome.  We tried to change his [name]  last visit from 95 mg, 2 capsules 4 times per day to [name]  145, 1 capsule 4 times per day, but unfortunately it gave him more off time and he ended up going back to the prior dosing.  He has dyskinesia after taking the medication but its only lasting short periods of time.  He has a few off times in the day, 1 pm and 4pm.     Current prescribed movement disorder medications: [name]  95 mg, 2 tablets 4 times per day Clonazepam  0.5 mg, half tablet at night Amantadine , 100 mg 2 times per day  Provigil  prn (once per week usually)   PREVIOUS MEDICATIONS:  Requip ; Neupro (tried only a few days); propranolol helped tremor some; Inbrija  (samples given but not taken); Provigil ; Nuvigil ; selegiline  (no help for fatigue); opicapone  (increased dyskinesia); amantadine  ; opicapone    ALLERGIES:  No Known Allergies  CURRENT MEDICATIONS:  Outpatient Encounter Medications as of 04/15/2024  Medication Sig   amantadine  (SYMMETREL ) 100 MG capsule Take 1 capsule (  100 mg total) by mouth 2 (two) times daily.   Ascorbic Acid  (VITAMIN C  PO) Take 3 tablets by mouth 3 (three) times daily.    b complex vitamins tablet Take 1 tablet by mouth 2 (two) times daily.   Carbidopa -Levodopa  ER ([name] ) 23.75-95 MG CPCR 2 tablets 3-4 times per day   clonazePAM  (KLONOPIN ) 0.5 MG tablet TAKE 1/2 TABLET(0.25 MG) BY MOUTH AT BEDTIME   ketoconazole  (NIZORAL ) 2 % cream Apply 1 application topically daily. To affected areas.   Melatonin 3 MG CAPS Take 3 mg by mouth at bedtime.   modafinil  (PROVIGIL ) 200 MG tablet Take 1 tablet (200 mg total) by mouth every morning.   terbinafine  (LAMISIL ) 1 % cream Apply 1 application topically 2 (two) times daily.   No facility-administered encounter medications on file as of 04/15/2024.    Objective:   PHYSICAL EXAMINATION:     VITALS:   Vitals:   04/15/24 0914  BP: 122/80  Pulse: 88  SpO2: 96%  Weight: 177 lb (80.3 kg)     GEN:  The patient appears stated age and is in NAD. HEENT:  Normocephalic, atraumatic.    Neurological examination:  Orientation: The patient is alert and oriented x3. Cranial nerves: There is good facial symmetry with minimal facial hypomimia.  Extraocular muscles are intact.  The speech is fluent and with intermittent dysarthria and stuttering.  Soft palate rises symmetrically and there is no tongue deviation. Hearing is intact to conversational tone. Sensation: Sensation is intact to light touch throughout Motor: Strength is at least antigravity x4  Movement examination: Tone: There is normal tone today. Abnormal movements: There is very mild dyskinesia at the beginning of the visit as he had just taken his [name] , but this went away fairly quickly during the visit. Coordination:  There is no decremation with any form of RAMS, including alternating supination and pronation of the forearm, hand opening and closing, finger taps, heel taps and toe taps bilaterally. Gait and Station: The patient is able to ambulate well.  He has good stride length.  He has good arm swing today bilaterally.  He is able to turn easily and swiftly.    I have reviewed and interpreted the following labs independently    Chemistry      Component Value Date/Time   NA 141 10/15/2018 1533   K 4.4 10/15/2018 1533   CL 105 10/15/2018 1533   CO2 28 10/15/2018 1533   BUN 16 10/15/2018 1533   CREATININE 1.05 10/15/2018 1533   CREATININE 0.91 11/25/2014 1051      Component Value Date/Time   CALCIUM 9.7 10/15/2018 1533   ALKPHOS 69 06/10/2015 1155   AST 27 06/10/2015 1155   ALT 40 06/10/2015 1155   BILITOT 1.0 06/10/2015 1155       Lab Results  Component Value Date   WBC 6.5 10/15/2018   HGB 15.4 10/15/2018   HCT 48.2 10/15/2018   MCV 91.6 10/15/2018   PLT 302 10/15/2018    No results found  for: "TSH"    Cc:  Alfredia Ina, MD

## 2024-04-15 ENCOUNTER — Encounter: Payer: Self-pay | Admitting: Neurology

## 2024-04-15 ENCOUNTER — Telehealth: Payer: Self-pay | Admitting: Pharmacy Technician

## 2024-04-15 ENCOUNTER — Ambulatory Visit (INDEPENDENT_AMBULATORY_CARE_PROVIDER_SITE_OTHER): Admitting: Neurology

## 2024-04-15 ENCOUNTER — Other Ambulatory Visit (HOSPITAL_COMMUNITY): Payer: Self-pay

## 2024-04-15 ENCOUNTER — Ambulatory Visit: Payer: Medicare Other | Admitting: Psychology

## 2024-04-15 VITALS — BP 122/80 | HR 88 | Wt 177.0 lb

## 2024-04-15 DIAGNOSIS — G20B2 Parkinson's disease with dyskinesia, with fluctuations: Secondary | ICD-10-CM | POA: Diagnosis not present

## 2024-04-15 DIAGNOSIS — Z9689 Presence of other specified functional implants: Secondary | ICD-10-CM

## 2024-04-15 DIAGNOSIS — F411 Generalized anxiety disorder: Secondary | ICD-10-CM

## 2024-04-15 MED ORDER — GOCOVRI 137 MG PO CP24: ORAL_CAPSULE | ORAL | 0 refills | Status: AC

## 2024-04-15 NOTE — Telephone Encounter (Signed)
 PA has been submitted, and telephone encounter has been created. Please see telephone encounter dated 4.28.25.  APPROVED

## 2024-04-15 NOTE — Telephone Encounter (Signed)
 I need a Pa for this patient to start Gocovri  137 mg 1 time at bed please

## 2024-04-15 NOTE — Procedures (Signed)
 DBS Programming was performed.    Manufacturer of DBS device: [employer]  Total time spent programming was 50 minutes.  Device was confirmed to be on.  Soft start was confirmed to be on.  Impedences were checked and were within normal limits.  Battery was checked and was determined to be functioning normally and not near the end of life.  We spent much of today's visit trialing various settings and turning down the device.  We tried to move the contacts more inferiorly, but ended up getting more dyskinesia.  We did find that the left brain seemed to cause some speech troubles and ended up turning it back some.  Final settings were as follows:    Active Contact Amplitude (mA) PW (ms) Frequency (hz) Side Effects  Left Brain       11/23/20 3-(90%)4-(10%)C+ 3.8 (2.8-4.0) 60 130   04/26/21 3-(90%)4-(10%)C+ 3.8 60 130   11/02/21 3-(90%)4-(10%)C+ 3.8 60 130   04/11/22 3-(90%)4-(10%)C+ 3.8 60 130   10/17/22 3-(90%)4-(10%)C+ 3.8 60 130   05/11/23 3-(90%)4-(10%)C+ 3.8 60 130   11/13/23 3-(90%)4-(10%)C+ 3.7 60 130   04/15/24 prog2 3-(90%)4-(10%)C+ 3.3 60 130                 Right Brain       11/23/20 2-(30%)3-(70%)C+ 3.2 60 130 Tried PW of 70 but not tolerated (?dizzy)  04/26/21 2-(30%)3-(70%)C+ 3.2 60 130   11/02/21 2-(30%)3-(70%)C+ 3.1 60 130   04/11/22 2-(30%)3-(70%)C+ 3.2 60 130   10/17/22 2-(30%)3-(70%)C+ 3.2 60 130   05/11/23 2-(30%)3-(70%)C+ 3.2 60 130   11/13/23 2-(30%)3-(70%)C+ 3.3 60 130   04/15/24 2-(30%)3-(70%)C+ 3.3 60 130

## 2024-04-15 NOTE — Patient Instructions (Addendum)
 STOP amantadine  START Gocovri  137 mg, 1 capsule x 1 week then 2 at bedtime thereafter We deceased the energy on the R body to 3.3 (was at 3.7).  You can increase it back if you need to.

## 2024-04-15 NOTE — Telephone Encounter (Signed)
 Pharmacy Patient Advocate Encounter  Received notification from [facility] that Prior Authorization for GOCOVRI  137MG  has been APPROVED from 3.29.25 to 12.31.2099. Ran test claim, Copay is $76. This test claim was processed through [facility]- copay amounts may vary at other pharmacies due to pharmacy/plan contracts, or as the patient moves through the different stages of their insurance plan.   PA #/Case ID/Reference #: [id number]

## 2024-04-15 NOTE — Telephone Encounter (Signed)
 Pharmacy Patient Advocate Encounter   Received notification from CoverMyMeds that prior authorization for GOCOVRI  137MG  is required/requested.   Insurance verification completed.   The patient is insured through [employer] .   Per test claim: PA required; PA submitted to above mentioned insurance via CoverMyMeds Key/confirmation #/EOC [id number] Status is pending

## 2024-04-16 ENCOUNTER — Ambulatory Visit (INDEPENDENT_AMBULATORY_CARE_PROVIDER_SITE_OTHER): Admitting: Psychology

## 2024-04-16 DIAGNOSIS — F411 Generalized anxiety disorder: Secondary | ICD-10-CM

## 2024-04-16 NOTE — Progress Notes (Signed)
 [facility] Counselor Initial Adult Exam  Name: Ian [name] Date: 04/16/2024 MRN: [id number] DOB: 04-02-1961 PCP: Alfredia Ina, MD     [name] participated from home, via video, and consented to treatment. Therapist participated from home office.    Guardian/Payee:  N/A    Paperwork requested: No   Reason for Visit /Presenting Problem: chronic anxiety  Mental Status Exam: Appearance:   Casual     Behavior:  Appropriate  Motor:  Normal  Speech/Language:   Rapid cadence and moderate stutter  Affect:  Appropriate  Mood:  anxious  Thought process:  normal  Thought content:    WNL  Sensory/Perceptual disturbances:    WNL  Orientation:  oriented to person, place, and situation  Attention:  Good  Concentration:  Good  Memory:  WNL  Fund of knowledge:   Good  Insight:    Good  Judgment:   Good  Impulse Control:  Good    Reported Symptoms:  worry, phobia (public speaking), stutter, rate of speech  Risk Assessment: Danger to Self:  No Self-injurious Behavior: No Danger to Others: No Duty to Warn:no Physical Aggression / Violence:No  Access to Firearms a concern:  unknown Gang Involvement:No  Patient / guardian was educated about steps to take if suicide or homicide risk level increases between visits: n/a While future psychiatric events cannot be accurately predicted, the patient does not currently require acute inpatient psychiatric care and does not currently meet [place]  involuntary commitment criteria.  Substance Abuse History: Current substance abuse: No     Past Psychiatric History:   No previous psychological problems have been observed Outpatient  Providers:N/A History of Psych Hospitalization: No  Psychological Testing:  N/A    Abuse History:  Victim of: No.,  N/A    Report needed: No. Victim of Neglect:No. Perpetrator of  N/A   Witness / Exposure to Domestic Violence: No   Protective Services Involvement: No  Witness to [employer] Violence:  No   Family History:  Family History  Problem Relation Age of Onset   Breast cancer Mother    Diabetes Father    Healthy Sister    Healthy Daughter    Healthy Son    Diabetes Other    Cancer Other     Living situation: the patient lives with their spouse  Sexual Orientation: Straight  Relationship Status: married  Name of spouse / other:unknown If a parent, number of children / ages:son 64 and 47 year old twin daughters  Support Systems: spouse Nutritional therapist Stress:  No   Income/Employment/Disability: Long-Term Research officer, political party Service: Yes   Educational History: Education: Risk manager: unknown  Any  cultural differences that may affect / interfere with treatment:  N/A  Recreation/Hobbies: exercise, golf  Stressors: Health problems    Strengths: Supportive Relationships, Family, and Church  Barriers:  social withdrawal   Legal History: Pending legal issue / charges: The patient has no significant history of legal issues. History of legal issue / charges:  N/A  Medical History/Surgical History: reviewed Past Medical History:  Diagnosis Date   Anxiety    History of kidney stones    passed   PD (Parkinson's disease) (HCC) 09/10/2013   PONV (postoperative nausea and vomiting)    nausea     Past Surgical History:  Procedure Laterality Date   COLONOSCOPY     MINOR PLACEMENT OF FIDUCIAL N/A 01/31/2017   Procedure: MINOR PLACEMENT OF FIDUCIAL;  Surgeon: Manya Sells, MD;  Location: [facility] OR;  Service: Neurosurgery;  Laterality: N/A;  Fiducial placement   none     PULSE GENERATOR IMPLANT  Right 02/17/2017   Procedure: Right implantable pulse generator placement;  Surgeon: Manya Sells, MD;  Location: [facility] OR;  Service: Neurosurgery;  Laterality: Right;  Bilateral implantable pulse generator placement   SUBTHALAMIC STIMULATOR BATTERY REPLACEMENT N/A 10/19/2018   Procedure: Deep Brain stimulator implantable pulse generator revision;  Surgeon: Manya Sells, MD;  Location: [facility] OR;  Service: Neurosurgery;  Laterality: N/A;  Deep Brain stimulator implantable pulse generator revision   SUBTHALAMIC STIMULATOR INSERTION Bilateral 02/10/2017   Procedure: Bilateral Deep brain stimulator placement;  Surgeon: Manya Sells, MD;  Location[facility] OR;  Service: Neurosurgery;  Laterality: Bilateral;  Bilateral Deep brain stimulator placement   UPPER GASTROINTESTINAL ENDOSCOPY      Medications: see above list.      Initial Session: Diagnosed with Parkinsons in 2014. He stutters and is trying to determine if his speech problems are anxiety/psychological or physical. He says he has had anxiety for a long time. Says he worries about things all the time. He says his mom had issues when he was growing up (addicted to pain meds) and that contributed to his chronic worry. He does think his anxiety is better than it was in the past. He is strongly spiritual. 60 year old son and 25 year old twin daughters. No depressive symptoms reported. Went on disability in 2014. He was on Ativan , but got off of it out of fear that it would become addicting to him. Married for 32 years and good relationship. 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 That was 2006. He was 22 years in [employer]. 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 His biggest issue currently is his speech in that by the end of the day, hard for people to understand him. Speech got bad in past 2-3 years. He goes to exercise classes for Parkinson's patients. Wife  doesn't work. Strong supportive church family since 2007. Plays golf weekly and works out with Systems analyst 2 times a week. Sees kids often and just had first grandchild.  FOO: Father had diabetes. Fraternal grandfather was alcoholic and spent time in jail. Mother dies of breast cancer later in life. Father died during pandemic at 69 (in 2021). He has a twin sister in [place] and they ar "very close". He isn't as close to some of his guys friends lately and he thinks he pulled back due to the speech issues. Hasn't shared with everyone that he has Parkinson's. He lost his closest lifelong  friend in 2021. This was 3 months after his father passed.  He has tried breathing exercises to reduce his anxiety with mild results. Going for a walk or exercise helps anxiety the best. Reports sleep is good. Told him to keep an anxiety journal with rating anxiety on a 0-10 scale. Also recommended The Anxiety and Phobia Workbook. He has become phobic about public speaking.     Goals/Treatment Plan: Patient seeking counseling to reduce above named symptoms of anxiety. He is also working on adjusting to his Parkinson's diagnosis. Hoping to improve speech issues that are related to his anxiety and regain self-confidence. Will employ behavioral strates and insight oriented psychotherapy interventions. Goal Date is 12-25.   Diagnoses:  Generalized Anxiety Disorder  Plan of Care: Outpatient Psychotherapy  Patient was seen via video session ([facility]) and is aware of the platform limitations. He is at home and I am at office. Session Note: Patient says he had his appointment with Dr. Winferd Hatter and the rep from the DBS company was there and made some adjustments to his device. She also changed one of his meds to extended release. He cannot tell for sure but thinks speech is a little better and he is optimistic. He has a lot of activities coming up. He tried another person at the [place] to see if they could be more responsive.  Still waiting to hear back. He states he is being more conscious about stressors and says his emotions are less volatile. He feels that things are overall very good. He has a [employer] this week. We discussed anticipatory anxiety and how to manage. He also reconnected with an old friend and they are going to get together. Read chapter on anxiety and told him to next focus on chapter about feelings.                        Jola Nash, PhD 8:40a-9:30a 50 minutes

## 2024-04-29 ENCOUNTER — Ambulatory Visit (INDEPENDENT_AMBULATORY_CARE_PROVIDER_SITE_OTHER): Admitting: Psychology

## 2024-04-29 DIAGNOSIS — F411 Generalized anxiety disorder: Secondary | ICD-10-CM

## 2024-04-29 NOTE — Progress Notes (Signed)
  Behavioral Health Counselor Initial Adult Exam  Name: Ian [name] Date: 04/29/2024 MRN: [id number] DOB: Jun 20, 1961 PCP: Ian Ina, MD     [name] participated from home, via video, and consented to treatment. Therapist participated from home office.    Guardian/Payee:  N/A    Paperwork requested: No   Reason for Visit /Presenting Problem: chronic anxiety  Mental Status Exam: Appearance:   Casual     Behavior:  Appropriate  Motor:  Normal  Speech/Language:   Rapid cadence and moderate stutter  Affect:  Appropriate  Mood:  anxious  Thought process:  normal  Thought content:    WNL  Sensory/Perceptual disturbances:    WNL  Orientation:  oriented to person, place, and situation  Attention:  Good  Concentration:  Good  Memory:  WNL  Fund of knowledge:   Good  Insight:    Good  Judgment:   Good  Impulse Control:  Good    Reported Symptoms:  worry, phobia (public speaking), stutter, rate of speech  Risk Assessment: Danger to Self:  No Self-injurious Behavior: No Danger to Others: No Duty to Warn:no Physical Aggression / Violence:No  Access to Firearms a concern: unknown Gang Involvement:No  Patient / guardian was educated about steps to take if suicide or homicide risk level increases between visits: n/a While future psychiatric events cannot be accurately predicted, the patient does not currently require acute inpatient psychiatric care and does not currently meet [place]  involuntary commitment criteria.  Substance Abuse History: Current substance abuse: No     Past Psychiatric History:   No previous psychological problems have been observed Outpatient  Providers:N/A History of Psych Hospitalization: No  Psychological Testing: N/A   Abuse History:  Victim of: No., N/A   Report needed: No. Victim of Neglect:No. Perpetrator of N/A  Witness / Exposure to Domestic Violence: No   Protective Services Involvement: No  Witness to [employer] Violence:  No   Family History:  Family History  Problem Relation Age of Onset   Breast cancer Mother    Diabetes Father    Healthy Sister    Healthy Daughter    Healthy Son    Diabetes Other    Cancer Other     Living situation: the patient lives with their spouse  Sexual Orientation: Straight  Relationship Status: married  Name of spouse / other:unknown If a parent, number of children / ages:son 47 and 70 year old twin daughters  Support Systems: spouse Nutritional therapist Stress:  No   Income/Employment/Disability: Long-Term Research officer, political party Service: Yes   Educational History: Education: college graduate  Religion/Sprituality/World View: unknown  Any cultural differences that may affect / interfere  with treatment:  N/A  Recreation/Hobbies: exercise, golf  Stressors: Health problems    Strengths: Supportive Relationships, Family, and Church  Barriers:  social withdrawal   Legal History: Pending legal issue / charges: The patient has no significant history of legal issues. History of legal issue / charges: N/A  Medical History/Surgical History: reviewed Past Medical History:  Diagnosis Date   Anxiety    History of kidney stones    passed   PD (Parkinson's disease) (HCC) 09/10/2013   PONV (postoperative nausea and vomiting)    nausea     Past Surgical History:  Procedure Laterality Date   COLONOSCOPY     MINOR PLACEMENT OF FIDUCIAL N/A 01/31/2017   Procedure: MINOR PLACEMENT OF FIDUCIAL;  Surgeon: Manya Sells, MD;  Location: [facility] OR;  Service: Neurosurgery;  Laterality: N/A;  Fiducial placement   none     PULSE GENERATOR IMPLANT Right  02/17/2017   Procedure: Right implantable pulse generator placement;  Surgeon: Manya Sells, MD;  Location[facility] OR;  Service: Neurosurgery;  Laterality: Right;  Bilateral implantable pulse generator placement   SUBTHALAMIC STIMULATOR BATTERY REPLACEMENT N/A 10/19/2018   Procedure: Deep Brain stimulator implantable pulse generator revision;  Surgeon: Manya Sells, MD;  Location: [facility] OR;  Service: Neurosurgery;  Laterality: N/A;  Deep Brain stimulator implantable pulse generator revision   SUBTHALAMIC STIMULATOR INSERTION Bilateral 02/10/2017   Procedure: Bilateral Deep brain stimulator placement;  Surgeon: Manya Sells, MD;  Location: [facility] OR;  Service: Neurosurgery;  Laterality: Bilateral;  Bilateral Deep brain stimulator placement   UPPER GASTROINTESTINAL ENDOSCOPY      Medications: see above list.      Initial Session: Diagnosed with Parkinsons in 2014. He stutters and is trying to determine if his speech problems are anxiety/psychological or physical. He says he has had anxiety for a long time. Says he worries about things all the time. He says his mom had issues when he was growing up (addicted to pain meds) and that contributed to his chronic worry. He does think his anxiety is better than it was in the past. He is strongly spiritual. 39 year old son and 61 year old twin daughters. No depressive symptoms reported. Went on disability in 2014. He was on Ativan , but got off of it out of fear that it would become addicting to him. Married for 32 years and good relationship. 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 That was 2006. He was 22 years in [employer]. 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 His biggest issue currently is his speech in that by the end of the day, hard for people to understand him. Speech got bad in past 2-3 years. He goes to exercise classes for Parkinson's patients. Wife doesn't  work. Strong supportive church family since 2007. Plays golf weekly and works out with Systems analyst 2 times a week. Sees kids often and just had first grandchild.  FOO: Father had diabetes. Fraternal grandfather was alcoholic and spent time in jail. Mother dies of breast cancer later in life. Father died during pandemic at 41 (in 2021). He has a twin sister in [place] and they ar "very close". He isn't as close to some of his guys friends lately and he thinks he pulled back due to the speech issues. Hasn't shared with everyone that he has Parkinson's. He lost his closest lifelong friend in 2021. This was 3 months after  his father passed.  He has tried breathing exercises to reduce his anxiety with mild results. Going for a walk or exercise helps anxiety the best. Reports sleep is good. Told him to keep an anxiety journal with rating anxiety on a 0-10 scale. Also recommended The Anxiety and Phobia Workbook. He has become phobic about public speaking.     Goals/Treatment Plan: Patient seeking counseling to reduce above named symptoms of anxiety. He is also working on adjusting to his Parkinson's diagnosis. Hoping to improve speech issues that are related to his anxiety and regain self-confidence. Will employ behavioral strates and insight oriented psychotherapy interventions. Goal Date is 12-25.   Diagnoses:  Generalized Anxiety Disorder  Plan of Care: Outpatient Psychotherapy  Patient was seen via video session (Caregility) and is aware of the platform limitations. He is at home and I am at office. Session Note: Patient says that if was a tough couple of weeks after the device adjustment. He expects to finally hear today about the "Speak Easy" device and hopes it will be approved. He says things went well at the [employer] anniversary. He was worried about how he would tolerate the events, but they all went well. Used deep breathing and positive cognitions to get through. He states the meds are  not wearing off as quickly with his new prescriptions (time release).  He reports that daughter shared her post partum depression symptoms with he and his wife. Concerned about her but optimistic. It is a challenge having them live in household and we discussed best ways to manage. He read chapter on feelings and found t "enlightening". In particular, he was struck by his own tendency to suppress his feelings. He avoids conflict and strives to "keep the peace". Expressed the challenge of articulating how he feels.                            Jola Nash, PhD 8:40a-9:30a 50 minutes                                                                                                                                                                                        [facility] Counselor Initial Adult Exam  Name: Ian [name] Date: 04/29/2024 MRN: [id number] DOB: 1961-09-25 PCP: Ian Ina, MD     [name] participated from home, via video, and consented to treatment. Therapist participated from home office.    Guardian/Payee:  N/A    Paperwork requested: No   Reason for Visit /Presenting Problem: chronic anxiety  Mental Status Exam: Appearance:   Casual     Behavior:  Appropriate  Motor:  Normal  Speech/Language:   Rapid cadence and moderate stutter  Affect:  Appropriate  Mood:  anxious  Thought process:  normal  Thought content:    WNL  Sensory/Perceptual disturbances:    WNL  Orientation:  oriented to person, place, and situation  Attention:  Good  Concentration:  Good  Memory:  WNL  Fund of knowledge:   Good  Insight:    Good  Judgment:   Good  Impulse Control:  Good    Reported Symptoms:  worry, phobia (public speaking), stutter, rate of speech  Risk  Assessment: Danger to Self:  No Self-injurious Behavior: No Danger to Others: No Duty to Warn:no Physical Aggression / Violence:No  Access to Firearms a concern: unknown Gang Involvement:No  Patient / guardian was educated about steps to take if suicide or homicide risk level increases between visits: n/a While future psychiatric events cannot be accurately predicted, the patient does not currently require acute inpatient psychiatric care and does not currently meet [place]  involuntary commitment criteria.  Substance Abuse History: Current substance abuse: No     Past Psychiatric History:   No previous psychological problems have been observed Outpatient Providers:N/A History of Psych Hospitalization: No  Psychological Testing: N/A   Abuse History:  Victim of: No., N/A   Report needed: No. Victim of Neglect:No. Perpetrator of N/A  Witness / Exposure to Domestic Violence: No   Protective Services Involvement: No  Witness to [employer] Violence:  No   Family History:  Family History  Problem Relation Age of Onset   Breast cancer Mother    Diabetes Father    Healthy Sister    Healthy Daughter    Healthy Son    Diabetes Other    Cancer Other     Living situation: the patient lives with their spouse  Sexual Orientation: Straight  Relationship Status: married  Name of spouse / other:unknown If a parent, number of children / ages:son 40 and 58 year old twin daughters  Support Systems: spouse Nutritional therapist Stress:  No   Income/Employment/Disability: Long-Term Research officer, political party Service: Yes   Educational History: Education: Risk manager: unknown  Any cultural differences that may affect / interfere with treatment:  N/A  Recreation/Hobbies: exercise, golf  Stressors: Health problems    Strengths: Supportive Relationships, Family, and Church  Barriers:  social withdrawal   Legal  History: Pending legal issue / charges: The patient has no significant history of legal issues. History of legal issue / charges: N/A  Medical History/Surgical History: reviewed Past Medical History:  Diagnosis Date   Anxiety    History of kidney stones    passed   PD (Parkinson's disease) (HCC) 09/10/2013   PONV (postoperative nausea and vomiting)    nausea     Past Surgical History:  Procedure Laterality Date   COLONOSCOPY     MINOR PLACEMENT OF FIDUCIAL N/A 01/31/2017   Procedure: MINOR PLACEMENT OF FIDUCIAL;  Surgeon: Manya Sells, MD;  Location: [facility] OR;  Service: Neurosurgery;  Laterality: N/A;  Fiducial placement   none     PULSE GENERATOR IMPLANT Right 02/17/2017   Procedure: Right implantable pulse generator placement;  Surgeon: Manya Sells, MD;  Location: [facility] OR;  Service: Neurosurgery;  Laterality: Right;  Bilateral implantable pulse generator placement   SUBTHALAMIC STIMULATOR BATTERY REPLACEMENT N/A 10/19/2018   Procedure: Deep Brain stimulator implantable pulse generator revision;  Surgeon: Manya Sells, MD;  Location: [facility] OR;  Service: Neurosurgery;  Laterality: N/A;  Deep Brain stimulator implantable pulse generator revision   SUBTHALAMIC STIMULATOR INSERTION Bilateral 02/10/2017   Procedure: Bilateral Deep brain stimulator placement;  Surgeon: Manya Sells, MD;  Location: [name] OR;  Service: Neurosurgery;  Laterality: Bilateral;  Bilateral Deep brain stimulator placement   UPPER GASTROINTESTINAL ENDOSCOPY      Medications: see above list.      Initial Session: Diagnosed with Parkinsons in 2014. He stutters and is trying to determine if his speech problems are anxiety/psychological or physical. He says he has had anxiety for a long time. Says he worries about things all the time. He says his mom had issues when he was growing up (addicted to pain meds) and that contributed to his chronic worry. He does think his anxiety is better than it was in the past. He is strongly  spiritual. 107 year old son and 84 year old twin daughters. No depressive symptoms reported. Went on disability in 2014. He was on Ativan , but got off of it out of fear that it would become addicting to him. Married for 32 years and good relationship. 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 That was 2006. He was 22 years in [employer]. 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 His biggest issue currently is his speech in that by the end of the day, hard for people to understand him. Speech got bad in past 2-3 years. He goes to exercise classes for Parkinson's patients. Wife doesn't work. Strong supportive church family since 2007. Plays golf weekly and works out with Systems analyst 2 times a week. Sees kids often and just had first grandchild.  FOO: Father had diabetes. Fraternal grandfather was alcoholic and spent time in jail. Mother dies of breast cancer later in life. Father died during pandemic at 45 (in 2021). He has a twin sister in [place] and they ar "very close". He isn't as close to some of his guys friends lately and he thinks he pulled back due to the speech issues. Hasn't shared with everyone that he has Parkinson's. He lost his closest lifelong friend in 2021. This was 3 months after his father passed.  He has tried breathing exercises to reduce his anxiety with mild results. Going for a walk or exercise helps anxiety the best. Reports sleep is good. Told him to keep an anxiety journal with rating anxiety on a 0-10 scale. Also recommended The Anxiety and Phobia Workbook. He has become phobic about public speaking.     Goals/Treatment Plan: Patient seeking counseling to reduce above named symptoms of anxiety. He is also working on adjusting to his Parkinson's diagnosis. Hoping to improve speech issues that are related to his anxiety and regain self-confidence. Will employ behavioral strates  and insight oriented psychotherapy interventions. Goal Date is 12-25.   Diagnoses:  Generalized Anxiety Disorder  Plan of Care: Outpatient Psychotherapy  Patient was seen via video session (Caregility) and is aware of the platform limitations. He is at home and I am at office. Session Note: Patient says                         Jola Nash, PhD 7:40a-8:30a 50 minutes

## 2024-05-01 ENCOUNTER — Encounter: Payer: Self-pay | Admitting: Neurology

## 2024-05-01 NOTE — Progress Notes (Unsigned)
  [facility] [address] [place] [address] Phone: [phone] Subjective:   Ian [name], am serving as a scribe for Dr. Ronnell Coins.  I'm seeing this patient by the request  of:  Alfredia Ina, MD  CC: Back and neck pain follow-up  [id number]  [name] is a 63 y.o. male coming in with complaint of back and neck pain. Omt 03/19/2024. Patient states that he has been doing well. Here for maintenance.  Nothing severe overall.  Continues to be active.  Medications patient has been prescribed: None  Taking:         Reviewed prior external information including notes and imaging from previsou exam, outside providers and external EMR if available.   As well as notes that were available from care everywhere and other healthcare systems.  Past medical history, social, surgical and family history all reviewed in electronic medical record.  No pertanent information unless stated regarding to the chief complaint.   Past Medical History:  Diagnosis Date   Anxiety    History of kidney stones    passed   PD (Parkinson's disease) (HCC) 09/10/2013   PONV (postoperative nausea and vomiting)    nausea     No Known Allergies   Review of Systems:  No headache, visual changes, nausea, vomiting, diarrhea, constipation, dizziness, abdominal pain, skin rash, fevers, chills, night sweats, weight loss, swollen lymph nodes, body aches, joint swelling, chest pain, shortness of breath, mood changes. POSITIVE muscle aches  Objective  Blood pressure 110/80, height 6\' 2"  (1.88 m), weight 173 lb (78.5 kg).   General: No apparent distress alert and oriented x3 mood and affect normal, dressed appropriately.  HEENT: Pupils equal, extraocular movements intact  Respiratory: Patient's speak in full sentences and does not appear short of breath  Cardiovascular: No lower extremity edema, non tender, no erythema  Gait MSK:  Back mild hypertonicity noted.   Patient does have mild rigidity noted and cogwheeling of the upper extremity which is a little worse than patient's baseline.  Still though significantly better than when we saw him initially.  Osteopathic findings  C2 flexed rotated and side bent right C6 flexed rotated and side bent left T3 extended rotated and side bent right inhaled rib T9 extended rotated and side bent left L2 flexed rotated and side bent right L3 flexed rotated and side bent left Sacrum right on right       Assessment and Plan:  Hip flexor tendon tightness Mild increase in tightness.  Has been taking care of his granddaughter.  Thinks that it could be potentially contributing some.  Discussed icing regimen and home exercises, continues to be very active.  Follow-up again in 6 to 8 weeks    Nonallopathic problems  Decision today to treat with OMT was based on Physical Exam  After verbal consent patient was treated with HVLA, ME, FPR techniques in cervical, rib, thoracic, lumbar, and sacral  areas  Patient tolerated the procedure well with improvement in symptoms  Patient given exercises, stretches and lifestyle modifications  See medications in patient instructions if given  Patient will follow up in 4-8 weeks    The above documentation has been reviewed and is accurate and complete Laynie Espy M Jorryn Casagrande, DO          Note: This dictation was prepared with Dragon dictation along with smaller phrase technology. Any transcriptional errors that result from this process are unintentional.

## 2024-05-02 ENCOUNTER — Ambulatory Visit: Admitting: Family Medicine

## 2024-05-02 ENCOUNTER — Other Ambulatory Visit: Payer: Self-pay

## 2024-05-02 ENCOUNTER — Encounter: Payer: Self-pay | Admitting: Family Medicine

## 2024-05-02 VITALS — BP 110/80 | Ht 74.0 in | Wt 173.0 lb

## 2024-05-02 DIAGNOSIS — M24551 Contracture, right hip: Secondary | ICD-10-CM | POA: Diagnosis not present

## 2024-05-02 DIAGNOSIS — M9903 Segmental and somatic dysfunction of lumbar region: Secondary | ICD-10-CM

## 2024-05-02 DIAGNOSIS — M9902 Segmental and somatic dysfunction of thoracic region: Secondary | ICD-10-CM | POA: Diagnosis not present

## 2024-05-02 DIAGNOSIS — M9904 Segmental and somatic dysfunction of sacral region: Secondary | ICD-10-CM

## 2024-05-02 DIAGNOSIS — G20B2 Parkinson's disease with dyskinesia, with fluctuations: Secondary | ICD-10-CM

## 2024-05-02 DIAGNOSIS — M9901 Segmental and somatic dysfunction of cervical region: Secondary | ICD-10-CM

## 2024-05-02 DIAGNOSIS — M9908 Segmental and somatic dysfunction of rib cage: Secondary | ICD-10-CM

## 2024-05-02 MED ORDER — GOCOVRI 137 MG PO CP24
ORAL_CAPSULE | ORAL | 1 refills | Status: DC
Start: 2024-05-02 — End: 2024-10-29

## 2024-05-02 NOTE — Assessment & Plan Note (Signed)
 Stable with mild rigidity noted.  Will continue to monitor.  He is continuing to follow-up with neurology.

## 2024-05-02 NOTE — Assessment & Plan Note (Signed)
 Mild increase in tightness.  Has been taking care of his granddaughter.  Thinks that it could be potentially contributing some.  Discussed icing regimen and home exercises, continues to be very active.  Follow-up again in 6 to 8 weeks

## 2024-05-07 ENCOUNTER — Encounter: Payer: Self-pay | Admitting: Neurology

## 2024-05-08 ENCOUNTER — Encounter: Payer: Self-pay | Admitting: Neurology

## 2024-05-09 MED ORDER — CLONAZEPAM 0.5 MG PO TABS: ORAL_TABLET | ORAL | 1 refills | Status: DC

## 2024-05-14 ENCOUNTER — Encounter: Payer: Self-pay | Admitting: Neurology

## 2024-05-15 ENCOUNTER — Ambulatory Visit (INDEPENDENT_AMBULATORY_CARE_PROVIDER_SITE_OTHER): Admitting: Psychology

## 2024-05-15 DIAGNOSIS — F411 Generalized anxiety disorder: Secondary | ICD-10-CM | POA: Diagnosis not present

## 2024-05-15 NOTE — Progress Notes (Signed)
 [facility] Counselor Initial Adult Exam  Name: Ian [name] Date: 05/15/2024 MRN: [id number] DOB: July 01, 1961 PCP: Alfredia Ina, MD     [name] participated from home, via video, and consented to treatment. Therapist participated from home office.    Guardian/Payee:  N/A    Paperwork requested: No   Reason for Visit /Presenting Problem: chronic anxiety  Mental Status Exam: Appearance:   Casual     Behavior:  Appropriate  Motor:  Normal  Speech/Language:   Rapid cadence and moderate stutter  Affect:  Appropriate  Mood:  anxious  Thought process:  normal  Thought content:    WNL  Sensory/Perceptual disturbances:    WNL  Orientation:  oriented to person, place, and situation  Attention:  Good  Concentration:  Good  Memory:  WNL  Fund of knowledge:   Good  Insight:    Good  Judgment:   Good  Impulse Control:  Good    Reported Symptoms:  worry, phobia (public speaking), stutter, rate of speech  Risk Assessment: Danger to Self:  No Self-injurious Behavior: No Danger to Others: No Duty to Warn:no Physical Aggression / Violence:No  Access to Firearms a concern: unknown Gang Involvement:No  Patient / guardian was educated about steps to take if suicide or homicide risk level increases between visits: n/a While future psychiatric events cannot be accurately predicted, the patient does not currently require acute inpatient psychiatric care and does not currently meet [place]  involuntary commitment criteria.  Substance Abuse History: Current substance abuse: No     Past Psychiatric History:   No previous psychological problems have been  observed Outpatient Providers:N/A History of Psych Hospitalization: No  Psychological Testing: N/A   Abuse History:  Victim of: No., N/A   Report needed: No. Victim of Neglect:No. Perpetrator of N/A  Witness / Exposure to Domestic Violence: No   Protective Services Involvement: No  Witness to [employer] Violence:  No   Family History:  Family History  Problem Relation Age of Onset   Breast cancer Mother    Diabetes Father    Healthy Sister    Healthy Daughter    Healthy Son    Diabetes Other    Cancer Other     Living situation: the patient lives with their spouse  Sexual Orientation: Straight  Relationship Status: married  Name of spouse / other:unknown If a parent, number of children / ages:son 66 and 54 year old twin daughters  Support Systems: spouse Nutritional therapist Stress:  No   Income/Employment/Disability: Long-Term Research officer, political party Service: Yes   Educational History: Education:  college graduate  Religion/Sprituality/World View: unknown  Any cultural differences that may affect / interfere with treatment:  N/A  Recreation/Hobbies: exercise, golf  Stressors: Health problems    Strengths: Supportive Relationships, Family, and Church  Barriers:  social withdrawal   Legal History: Pending legal issue / charges: The patient has no significant history of legal issues. History of legal issue / charges: N/A  Medical History/Surgical History: reviewed Past Medical History:  Diagnosis Date   Anxiety    History of kidney stones    passed   PD (Parkinson's disease) (HCC) 09/10/2013   PONV (postoperative nausea and vomiting)    nausea     Past Surgical History:  Procedure Laterality Date   COLONOSCOPY     MINOR PLACEMENT OF FIDUCIAL N/A 01/31/2017   Procedure: MINOR PLACEMENT OF FIDUCIAL;  Surgeon: Manya Sells, MD;  Location: [facility] OR;  Service: Neurosurgery;  Laterality: N/A;  Fiducial placement   none     PULSE  GENERATOR IMPLANT Right 02/17/2017   Procedure: Right implantable pulse generator placement;  Surgeon: Manya Sells, MD;  Location[facility] OR;  Service: Neurosurgery;  Laterality: Right;  Bilateral implantable pulse generator placement   SUBTHALAMIC STIMULATOR BATTERY REPLACEMENT N/A 10/19/2018   Procedure: Deep Brain stimulator implantable pulse generator revision;  Surgeon: Manya Sells, MD;  Location: [facility] OR;  Service: Neurosurgery;  Laterality: N/A;  Deep Brain stimulator implantable pulse generator revision   SUBTHALAMIC STIMULATOR INSERTION Bilateral 02/10/2017   Procedure: Bilateral Deep brain stimulator placement;  Surgeon: Manya Sells, MD;  Location: [facility] OR;  Service: Neurosurgery;  Laterality: Bilateral;  Bilateral Deep brain stimulator placement   UPPER GASTROINTESTINAL ENDOSCOPY      Medications: see above list.      Initial Session: Diagnosed with Parkinsons in 2014. He stutters and is trying to determine if his speech problems are anxiety/psychological or physical. He says he has had anxiety for a long time. Says he worries about things all the time. He says his mom had issues when he was growing up (addicted to pain meds) and that contributed to his chronic worry. He does think his anxiety is better than it was in the past. He is strongly spiritual. 32 year old son and 45 year old twin daughters. No depressive symptoms reported. Went on disability in 2014. He was on Ativan , but got off of it out of fear that it would become addicting to him. Married for 32 years and good relationship. 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 That was 2006. He was 22 years in [employer]. 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 His biggest issue currently is his speech in that by the end of the day, hard for people to understand him. Speech got bad in past 2-3 years. He goes to exercise classes for Parkinson's  patients. Wife doesn't work. Strong supportive church family since 2007. Plays golf weekly and works out with Systems analyst 2 times a week. Sees kids often and just had first grandchild.  FOO: Father had diabetes. Fraternal grandfather was alcoholic and spent time in jail. Mother dies of breast cancer later in life. Father died during pandemic at 42 (in 2021). He has a twin sister in [place] and they ar "very close". He isn't as close to some of his guys friends lately and he thinks he pulled back due to the speech issues. Hasn't shared with everyone that he  has Parkinson's. He lost his closest lifelong friend in 2021. This was 3 months after his father passed.  He has tried breathing exercises to reduce his anxiety with mild results. Going for a walk or exercise helps anxiety the best. Reports sleep is good. Told him to keep an anxiety journal with rating anxiety on a 0-10 scale. Also recommended The Anxiety and Phobia Workbook. He has become phobic about public speaking.     Goals/Treatment Plan: Patient seeking counseling to reduce above named symptoms of anxiety. He is also working on adjusting to his Parkinson's diagnosis. Hoping to improve speech issues that are related to his anxiety and regain self-confidence. Will employ behavioral strates and insight oriented psychotherapy interventions. Goal Date is 12-25.   Diagnoses:  Generalized Anxiety Disorder  Plan of Care: Outpatient Psychotherapy  Patient was seen via video session (Caregility) and is aware of the platform limitations. He is at home and I am at office. Session Note: Patient says that if was a tough couple of weeks after the device adjustment. He expects to finally hear today about the "Speak Easy" device and hopes it will be approved. He says things went well at the [employer] anniversary. He was worried about how he would tolerate the events, but they all went well. Used deep breathing and positive cognitions to get through.  He states the meds are not wearing off as quickly with his new prescriptions (time release).  He reports that daughter shared her post partum depression symptoms with he and his wife. Concerned about her but optimistic. It is a challenge having them live in household and we discussed best ways to manage. He read chapter on feelings and found t "enlightening". In particular, he was struck by his own tendency to suppress his feelings. He avoids conflict and strives to "keep the peace". Expressed the challenge of articulating how he feels.                            Jola Nash, PhD 8:40a-9:30a 50 minutes                                                                                                                                                                                        [facility] Counselor Initial Adult Exam  Name: Ian [name] Date: 05/15/2024 MRN: [id number] DOB: 06/03/1961 PCP: Alfredia Ina, MD     [name] participated from home, via video, and consented to treatment. Therapist participated from home office.    Guardian/Payee:  N/A    Paperwork requested: No   Reason for Visit /Presenting Problem: chronic  anxiety  Mental Status Exam: Appearance:   Casual     Behavior:  Appropriate  Motor:  Normal  Speech/Language:   Rapid cadence and moderate stutter  Affect:  Appropriate  Mood:  anxious  Thought process:  normal  Thought content:    WNL  Sensory/Perceptual disturbances:    WNL  Orientation:  oriented to person, place, and situation  Attention:  Good  Concentration:  Good  Memory:  WNL  Fund of knowledge:   Good  Insight:    Good  Judgment:   Good  Impulse Control:  Good    Reported Symptoms:  worry, phobia (public speaking), stutter, rate of  speech  Risk Assessment: Danger to Self:  No Self-injurious Behavior: No Danger to Others: No Duty to Warn:no Physical Aggression / Violence:No  Access to Firearms a concern: unknown Gang Involvement:No  Patient / guardian was educated about steps to take if suicide or homicide risk level increases between visits: n/a While future psychiatric events cannot be accurately predicted, the patient does not currently require acute inpatient psychiatric care and does not currently meet [place]  involuntary commitment criteria.  Substance Abuse History: Current substance abuse: No     Past Psychiatric History:   No previous psychological problems have been observed Outpatient Providers:N/A History of Psych Hospitalization: No  Psychological Testing: N/A   Abuse History:  Victim of: No., N/A   Report needed: No. Victim of Neglect:No. Perpetrator of N/A  Witness / Exposure to Domestic Violence: No   Protective Services Involvement: No  Witness to [employer] Violence:  No   Family History:  Family History  Problem Relation Age of Onset   Breast cancer Mother    Diabetes Father    Healthy Sister    Healthy Daughter    Healthy Son    Diabetes Other    Cancer Other     Living situation: the patient lives with their spouse  Sexual Orientation: Straight  Relationship Status: married  Name of spouse / other:unknown If a parent, number of children / ages:son 36 and 41 year old twin daughters  Support Systems: spouse Nutritional therapist Stress:  No   Income/Employment/Disability: Long-Term Research officer, political party Service: Yes   Educational History: Education: Risk manager: unknown  Any cultural differences that may affect / interfere with treatment:  N/A  Recreation/Hobbies: exercise, golf  Stressors: Health problems    Strengths: Supportive Relationships, Family, and Church  Barriers:  social  withdrawal   Legal History: Pending legal issue / charges: The patient has no significant history of legal issues. History of legal issue / charges: N/A  Medical History/Surgical History: reviewed Past Medical History:  Diagnosis Date   Anxiety    History of kidney stones    passed   PD (Parkinson's disease) (HCC) 09/10/2013   PONV (postoperative nausea and vomiting)    nausea     Past Surgical History:  Procedure Laterality Date   COLONOSCOPY     MINOR PLACEMENT OF FIDUCIAL N/A 01/31/2017   Procedure: MINOR PLACEMENT OF FIDUCIAL;  Surgeon: Manya Sells, MD;  Location: [facility] OR;  Service: Neurosurgery;  Laterality: N/A;  Fiducial placement   none     PULSE GENERATOR IMPLANT Right 02/17/2017   Procedure: Right implantable pulse generator placement;  Surgeon: Manya Sells, MD;  Location: [facility] OR;  Service: Neurosurgery;  Laterality: Right;  Bilateral implantable pulse generator placement   SUBTHALAMIC STIMULATOR BATTERY REPLACEMENT N/A 10/19/2018   Procedure: Deep Brain  stimulator implantable pulse generator revision;  Surgeon: Manya Sells, MD;  Location: [facility] OR;  Service: Neurosurgery;  Laterality: N/A;  Deep Brain stimulator implantable pulse generator revision   SUBTHALAMIC STIMULATOR INSERTION Bilateral 02/10/2017   Procedure: Bilateral Deep brain stimulator placement;  Surgeon: Manya Sells, MD;  Location: [facility] OR;  Service: Neurosurgery;  Laterality: Bilateral;  Bilateral Deep brain stimulator placement   UPPER GASTROINTESTINAL ENDOSCOPY      Medications: see above list.      Initial Session: Diagnosed with Parkinsons in 2014. He stutters and is trying to determine if his speech problems are anxiety/psychological or physical. He says he has had anxiety for a long time. Says he worries about things all the time. He says his mom had issues when he was growing up (addicted to pain meds) and that contributed to his chronic worry. He does think his anxiety is better than it was in the  past. He is strongly spiritual. 34 year old son and 32 year old twin daughters. No depressive symptoms reported. Went on disability in 2014. He was on Ativan , but got off of it out of fear that it would become addicting to him. Married for 32 years and good relationship. 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 That was 2006. He was 22 years in [employer]. 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 His biggest issue currently is his speech in that by the end of the day, hard for people to understand him. Speech got bad in past 2-3 years. He goes to exercise classes for Parkinson's patients. Wife doesn't work. Strong supportive church family since 2007. Plays golf weekly and works out with Systems analyst 2 times a week. Sees kids often and just had first grandchild.  FOO: Father had diabetes. Fraternal grandfather was alcoholic and spent time in jail. Mother dies of breast cancer later in life. Father died during pandemic at 16 (in 2021). He has a twin sister in [place] and they ar "very close". He isn't as close to some of his guys friends lately and he thinks he pulled back due to the speech issues. Hasn't shared with everyone that he has Parkinson's. He lost his closest lifelong friend in 2021. This was 3 months after his father passed.  He has tried breathing exercises to reduce his anxiety with mild results. Going for a walk or exercise helps anxiety the best. Reports sleep is good. Told him to keep an anxiety journal with rating anxiety on a 0-10 scale. Also recommended The Anxiety and Phobia Workbook. He has become phobic about public speaking.     Goals/Treatment Plan: Patient seeking counseling to reduce above named symptoms of anxiety. He is also working on adjusting to his Parkinson's diagnosis. Hoping to improve speech issues that are related to his anxiety and regain self-confidence. Will employ  behavioral strates and insight oriented psychotherapy interventions. Goal Date is 12-25.   Diagnoses:  Generalized Anxiety Disorder  Plan of Care: Outpatient Psychotherapy  Patient was seen via video session (Caregility) and is aware of the platform limitations. He is at home and I am at office. Session Note: Patient says he has been reading book about the subconscious mind. We discussed about the concept of the "positive mind" and how it can benefit him. He found out that the [place] approved the speech device and he has his appointment today. He had a tough couple  of days that he slipped into negative thinking. His daughter and family are still living with him and he says it is going well. He talked about his struggle to be expressive and acknowledge feelings. 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                         Jola Nash, PhD 7:40a-8:30a 50 minutes

## 2024-05-27 ENCOUNTER — Ambulatory Visit (INDEPENDENT_AMBULATORY_CARE_PROVIDER_SITE_OTHER): Admitting: Psychology

## 2024-05-27 DIAGNOSIS — F411 Generalized anxiety disorder: Secondary | ICD-10-CM

## 2024-06-03 ENCOUNTER — Encounter: Payer: Self-pay | Admitting: Neurology

## 2024-06-03 ENCOUNTER — Other Ambulatory Visit: Payer: Self-pay

## 2024-06-03 DIAGNOSIS — G20B2 Parkinson's disease with dyskinesia, with fluctuations: Secondary | ICD-10-CM

## 2024-06-03 MED ORDER — RYTARY 23.75-95 MG PO CPCR: ORAL_CAPSULE | ORAL | 1 refills | Status: DC

## 2024-06-07 NOTE — Progress Notes (Unsigned)
  [name] [address] [place] [address] Phone: [phone] Subjective:    I'm seeing this patient by the request  of:  [name], MD  CC: Back and neck pain follow-up  YEP:Dlagzrupcz  Ian [name] is a 63 y.o. male coming in with complaint of back and neck pain. OMT on 05/02/2024. Patient states he is doing well nothing severe, has been doing relatively well.  Going to the beach next week.  Looking forward to that.         Reviewed prior external information including notes and imaging from previsou exam, outside providers and external EMR if available.   As well as notes that were available from care everywhere and other healthcare systems.  Past medical history, social, surgical and family history all reviewed in electronic medical record.  No pertanent information unless stated regarding to the chief complaint.   Past Medical History:  Diagnosis Date   Anxiety    History of kidney stones    passed   PD (Parkinson's disease) (HCC) 09/10/2013   PONV (postoperative nausea and vomiting)    nausea     No Known Allergies   Review of Systems:  No headache, visual changes, nausea, vomiting, diarrhea, constipation, dizziness, abdominal pain, skin rash, fevers, chills, night sweats, weight loss, swollen lymph nodes, body aches, joint swelling, chest pain, shortness of breath, mood changes. POSITIVE muscle aches  Objective  Blood pressure 122/74, pulse 76, height 6' 2 (1.88 m), weight 172 lb (78 kg), SpO2 98%.   General: No apparent distress alert and oriented x3 mood and affect normal, dressed appropriately.  HEENT: Pupils equal, extraocular movements intact  Respiratory: Patient's speak in full sentences and does not appear short of breath  Cardiovascular: No lower extremity edema, non tender, no erythema  Gait MSK:  Back does have some loss lordosis noted.  Does have some mild increase in hypertonicity of some of the  musculature.  Tightness noted with Deri right greater than left.  Osteopathic findings  C2 flexed rotated and side bent right C6 flexed rotated and side bent left T3 extended rotated and side bent right inhaled rib T9 extended rotated and side bent left L2 flexed rotated and side bent right L3 flexed rotated and side bent left L5 flexed rotated and side bent right Sacrum right on right     Assessment and Plan:  Hip flexor tendon tightness Continued tightness noted within his multifactorial.  Some secondary to the Parkinson's, some secondary to other chronic comorbidities.  Will increase stretching routine.  Discussed the eccentric's.  Increase activity slowly.  Follow-up again in 6 to 8 weeks    Nonallopathic problems  Decision today to treat with OMT was based on Physical Exam  After verbal consent patient was treated with HVLA, ME, FPR techniques in cervical, rib, thoracic, lumbar, and sacral  areas  Patient tolerated the procedure well with improvement in symptoms  Patient given exercises, stretches and lifestyle modifications  See medications in patient instructions if given  Patient will follow up in 4-8 weeks    The above documentation has been reviewed and is accurate and complete Ian Masaki M Santana Gosdin, DO          Note: This dictation was prepared with Dragon dictation along with smaller phrase technology. Any transcriptional errors that result from this process are unintentional.

## 2024-06-10 ENCOUNTER — Ambulatory Visit (INDEPENDENT_AMBULATORY_CARE_PROVIDER_SITE_OTHER): Admitting: Psychology

## 2024-06-10 DIAGNOSIS — F411 Generalized anxiety disorder: Secondary | ICD-10-CM

## 2024-06-10 NOTE — Progress Notes (Signed)
 [facility] Counselor Initial Adult Exam  Name: Ian [name] Date: 06/10/2024 MRN: [id number] DOB: 1961/02/14 PCP: [name], MD     [name] participated from home, via video, and consented to treatment. Therapist participated from home office.    Guardian/Payee:  N/A    Paperwork requested: No   Reason for Visit /Presenting Problem: chronic anxiety  Mental Status Exam: Appearance:   Casual     Behavior:  Appropriate  Motor:  Normal  Speech/Language:   Rapid cadence and moderate stutter  Affect:  Appropriate  Mood:  anxious  Thought process:  normal  Thought content:    WNL  Sensory/Perceptual disturbances:    WNL  Orientation:  oriented to person, place, and situation  Attention:  Good  Concentration:  Good  Memory:  WNL  Fund of knowledge:   Good  Insight:    Good  Judgment:   Good  Impulse Control:  Good    Reported Symptoms:  worry, phobia (public speaking), stutter, rate of speech  Risk Assessment: Danger to Self:  No Self-injurious Behavior: No Danger to Others: No Duty to Warn:no Physical Aggression / Violence:No  Access to Firearms a concern: unknown Gang Involvement:No  Patient / guardian was educated about steps to take if suicide or homicide risk level increases between visits: n/a While future psychiatric events cannot be accurately predicted, the patient does not currently require acute inpatient psychiatric care and does not currently meet [place]  involuntary commitment criteria.  Substance Abuse History: Current substance abuse: No     Past Psychiatric History:   No previous psychological  problems have been observed Outpatient Providers:N/A History of Psych Hospitalization: No  Psychological Testing: N/A   Abuse History:  Victim of: No., N/A   Report needed: No. Victim of Neglect:No. Perpetrator of N/A  Witness / Exposure to Domestic Violence: No   Protective Services Involvement: No  Witness to [employer] Violence:  No   Family History:  Family History  Problem Relation Age of Onset   Breast cancer Mother    Diabetes Father    Healthy Sister    Healthy Daughter    Healthy Son    Diabetes Other    Cancer Other     Living situation: the patient lives with their spouse  Sexual Orientation: Straight  Relationship Status: married  Name of spouse / other:unknown If a parent, number of children / ages:son 91 and 64 year old twin daughters  Support Systems: spouse Nutritional therapist Stress:  No  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 History of legal issue / charges: N/A  Medical History/Surgical History: reviewed Past Medical History:  Diagnosis Date   Anxiety    History of kidney stones    passed   PD (Parkinson's disease) (HCC) 09/10/2013   PONV (postoperative nausea and vomiting)    nausea     Past Surgical History:  Procedure Laterality Date   COLONOSCOPY     MINOR PLACEMENT OF FIDUCIAL N/A 01/31/2017   Procedure: MINOR PLACEMENT OF FIDUCIAL;  Surgeon: [name], MD;  Location: [facility] OR;  Service: Neurosurgery;  Laterality: N/A;  Fiducial placement    none     PULSE GENERATOR IMPLANT Right 02/17/2017   Procedure: Right implantable pulse generator placement;  Surgeon: [name], MD;  Location: [facility] OR;  Service: Neurosurgery;  Laterality: Right;  Bilateral implantable pulse generator placement   SUBTHALAMIC STIMULATOR BATTERY REPLACEMENT N/A 10/19/2018   Procedure: Deep Brain stimulator implantable pulse generator revision;  Surgeon: Levels Fairy, MD;  Location: [facility] OR;  Service: Neurosurgery;  Laterality: N/A;  Deep Brain stimulator implantable pulse generator revision   SUBTHALAMIC STIMULATOR INSERTION Bilateral 02/10/2017   Procedure: Bilateral Deep brain stimulator placement;  Surgeon: [name], MD;  Location: [facility] OR;  Service: Neurosurgery;  Laterality: Bilateral;  Bilateral Deep brain stimulator placement   UPPER GASTROINTESTINAL ENDOSCOPY      Medications: see above list.      Initial Session: Diagnosed with Parkinsons in 2014. He stutters and is trying to determine if his speech problems are anxiety/psychological or physical. He says he has had anxiety for a long time. Says he worries about things all the time. He says his mom had issues when he was growing up (addicted to pain meds) and that contributed to his chronic worry. He does think his anxiety is better than it was in the past. He is strongly spiritual. 62 year old son and 62 year old twin daughters. No depressive symptoms reported. Went on disability in 2014. He was on Ativan , but got off of it out of fear that it would become addicting to him. Married for 32 years and good relationship. 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 That was 2006. He was 22 years in [employer]. 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 His biggest issue currently is his speech in that by the end of the day, hard for people to understand him. Speech got bad in past 2-3 years. He goes to exercise classes  for Parkinson's patients. Wife doesn't work. Strong supportive church family since 2007. Plays golf weekly and works out with Systems analyst 2 times a week. Sees kids often and just had first grandchild.  FOO: Father had diabetes. Fraternal grandfather was alcoholic and spent time in jail. Mother dies of breast cancer later in life. Father died during pandemic at 62 (in 2021) (in 2021). He has a twin sister in [place] and they ar very close. He isn't as close to some of his guys friends lately and he  thinks he pulled back due to the speech issues. Hasn't shared with everyone that he has Parkinson's. He lost his closest lifelong friend in 2021. This was 3 months after his father passed.  He has tried breathing exercises to reduce his anxiety with mild results. Going for a walk or exercise helps anxiety the best. Reports sleep is good. Told him to keep an anxiety journal with rating anxiety on a 0-10 scale. Also recommended The Anxiety and Phobia Workbook. He has become phobic about public speaking.     Goals/Treatment Plan: Patient seeking counseling to reduce above named symptoms of anxiety. He is also working on adjusting to his Parkinson's diagnosis. Hoping to improve speech issues that are related to his anxiety and regain self-confidence. Will employ behavioral strates and insight oriented psychotherapy interventions. Goal Date is 12-25.   Diagnoses:  Generalized Anxiety Disorder  Plan of Care: Outpatient Psychotherapy  Patient was seen via video session (Caregility) and is aware of the platform limitations. He is at home and I am at office. Session Note: Patient says that if was a tough couple of weeks after the device adjustment. He expects to finally hear today about the Speak Easy device and hopes it will be approved. He says things went well at the [employer] anniversary. He was worried about how he would tolerate the events, but they all went well. Used deep breathing and positive cognitions  to get through. He states the meds are not wearing off as quickly with his new prescriptions (time release).  He reports that daughter shared her post partum depression symptoms with he and his wife. Concerned about her but optimistic. It is a challenge having them live in household and we discussed best ways to manage. He read chapter on feelings and found t enlightening. In particular, he was struck by his own tendency to suppress his feelings. He avoids conflict and strives to keep the peace. Expressed the challenge of articulating how he feels.                            CONI ALM KERNS, PhD 8:40a-9:30a 50 minutes                                                                                                                                                                                        [facility] Counselor Initial Adult Exam  Name: Alexxander Kurt Date: 06/10/2024 MRN: [id number] DOB: 1961-01-29 PCP: Pura ALM, MD     [name] participated from home, via video, and consented to treatment. Therapist participated from home office.    Guardian/Payee:  N/A    Paperwork requested: No   Reason for Visit /Presenting Problem: chronic anxiety  Mental Status Exam: Appearance:   Casual     Behavior:  Appropriate  Motor:  Normal  Speech/Language:   Rapid cadence and moderate stutter  Affect:  Appropriate  Mood:  anxious  Thought process:  normal  Thought content:    WNL  Sensory/Perceptual disturbances:    WNL  Orientation:  oriented to person, place, and situation  Attention:  Good  Concentration:  Good  Memory:  WNL  Fund of knowledge:   Good  Insight:    Good  Judgment:   Good  Impulse Control:  Good    Reported Symptoms:  worry, phobia (public speaking),  stutter, rate of speech  Risk Assessment: Danger to Self:  No Self-injurious Behavior: No Danger to Others: No Duty to Warn:no Physical Aggression / Violence:No  Access to Firearms a concern: unknown Gang Involvement:No  Patient / guardian was educated about steps to take if suicide or homicide risk level increases between visits: n/a While future psychiatric events cannot be accurately predicted, the patient does not currently require acute inpatient psychiatric care and does not currently meet [place]  involuntary commitment criteria.  Substance Abuse History: Current substance abuse: No     Past Psychiatric History:   No previous psychological problems have been observed Outpatient Providers:N/A History of Psych Hospitalization: No  Psychological Testing: N/A   Abuse History:  Victim of: No., N/A   Report needed: No. Victim of Neglect:No. Perpetrator of N/A  Witness / Exposure to Domestic Violence: No   Protective Services Involvement: No  Witness to [employer] Violence:  No   Family History:  Family History  Problem Relation Age of Onset   Breast cancer Mother    Diabetes Father    Healthy Sister    Healthy Daughter    Healthy Son    Diabetes Other    Cancer Other     Living situation: the patient lives with their spouse  Sexual Orientation: Straight  Relationship Status: married  Name of spouse / other:unknown If a parent, number of children / ages:son 35 and 562 year old twin daughters  Support Systems: spouse Nutritional therapist Stress:  No   Income/Employment/Disability: Long-Term Research officer, political party Service: Yes   Educational History: Education: Risk manager: unknown  Any cultural differences that may affect / interfere with treatment:  N/A  Recreation/Hobbies: exercise, golf  Stressors: Health problems    Strengths: Supportive Relationships, Family, and  Church  Barriers:  social withdrawal   Legal History: Pending legal issue / charges: The patient has no significant history of legal issues. History of legal issue / charges: N/A  Medical History/Surgical History: reviewed Past Medical History:  Diagnosis Date   Anxiety    History of kidney stones    passed   PD (Parkinson's disease) (HCC) 09/10/2013   PONV (postoperative nausea and vomiting)    nausea     Past Surgical History:  Procedure Laterality Date   COLONOSCOPY     MINOR PLACEMENT OF FIDUCIAL N/A 01/31/2017   Procedure: MINOR PLACEMENT OF FIDUCIAL;  Surgeon: [name], MD;  Location: [facility] OR;  Service: Neurosurgery;  Laterality: N/A;  Fiducial placement   none     PULSE GENERATOR IMPLANT Right 02/17/2017   Procedure: Right implantable pulse generator placement;  Surgeon: [name], MD;  Location: [facility] OR;  Service: Neurosurgery;  Laterality: Right;  Bilateral implantable pulse  generator placement   SUBTHALAMIC STIMULATOR BATTERY REPLACEMENT N/A 10/19/2018   Procedure: Deep Brain stimulator implantable pulse generator revision;  Surgeon: Unice Pac, MD;  Location: [facility] OR;  Service: Neurosurgery;  Laterality: N/A;  Deep Brain stimulator implantable pulse generator revision   SUBTHALAMIC STIMULATOR INSERTION Bilateral 02/10/2017   Procedure: Bilateral Deep brain stimulator placement;  Surgeon: Pac Unice, MD;  Location: [facility] OR;  Service: Neurosurgery;  Laterality: Bilateral;  Bilateral Deep brain stimulator placement   UPPER GASTROINTESTINAL ENDOSCOPY      Medications: see above list.      Initial Session: Diagnosed with Parkinsons in 2014. He stutters and is trying to determine if his speech problems are anxiety/psychological or physical. He says he has had anxiety for a long time. Says he worries about things all the time. He says his mom had issues when he was growing up (addicted to pain meds) and that contributed to his chronic worry. He does think his anxiety is  better than it was in the past. He is strongly spiritual. 69 year old son and 262 year old twin daughters. No depressive symptoms reported. Went on disability in 2014. He was on Ativan , but got off of it out of fear that it would become addicting to him. Married for 32 years and good relationship. 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 That was 2006. He was 22 years in [employer]. 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 His biggest issue currently is his speech in that by the end of the day, hard for people to understand him. Speech got bad in past 2-3 years. He goes to exercise classes for Parkinson's patients. Wife doesn't work. Strong supportive church family since 2007. Plays golf weekly and works out with Systems analyst 2 times a week. Sees kids often and just had first grandchild.  FOO: Father had diabetes. Fraternal grandfather was alcoholic and spent time in jail. Mother dies of breast cancer later in life. Father died during pandemic at 78 (in 2021). He has a twin sister in [place] and they ar very close. He isn't as close to some of his guys friends lately and he thinks he pulled back due to the speech issues. Hasn't shared with everyone that he has Parkinson's. He lost his closest lifelong friend in 2021. This was 3 months after his father passed.  He has tried breathing exercises to reduce his anxiety with mild results. Going for a walk or exercise helps anxiety the best. Reports sleep is good. Told him to keep an anxiety journal with rating anxiety on a 0-10 scale. Also recommended The Anxiety and Phobia Workbook. He has become phobic about public speaking.     Goals/Treatment Plan: Patient seeking counseling to reduce above named symptoms of anxiety. He is also working on adjusting to his Parkinson's diagnosis. Hoping to improve speech issues that are related to his anxiety and regain  self-confidence. Will employ behavioral strates and insight oriented psychotherapy interventions. Goal Date is 12-25.   Diagnoses:  Generalized Anxiety Disorder  Plan of Care: Outpatient Psychotherapy  Patient was seen via video session (Caregility) and is aware of the platform limitations. He is at home and I am at office. Session Note: Patient says that the people in charge of getting the Speak Easy device have ghosted him. He is very upset that they are not responding to him. He is awaiting official [place]  approval. This is making him feel sad in the mornings because he is tired of dealing with this situation.  He leaves Saturday for a family beach trip. He was hoping to have it for the trip. He has been employing behavioral strategies to keep self calm and it is working well. When he sleeps poorly, he has a tougher days. Got a call from sister late Friday night. Somebody called her to tell her that he heard that [place] had passed away.  This shook him up a bit. Reminded him of the death of his friend Koren, who passed away the same year as his dad. Discussed the feelings chapter in the book and the difficulty communicating feelings. His is mostly speech oriented rather than inhibition. Will use feeling journal when needed.                          CONI ALM KERNS, PhD 7:40a-8:30a 50 minutes

## 2024-06-12 ENCOUNTER — Other Ambulatory Visit: Payer: Self-pay

## 2024-06-12 DIAGNOSIS — G20B2 Parkinson's disease with dyskinesia, with fluctuations: Secondary | ICD-10-CM

## 2024-06-12 MED ORDER — RYTARY 23.75-95 MG PO CPCR: ORAL_CAPSULE | ORAL | 0 refills | Status: DC

## 2024-06-13 ENCOUNTER — Encounter: Payer: Self-pay | Admitting: Family Medicine

## 2024-06-13 ENCOUNTER — Ambulatory Visit: Admitting: Family Medicine

## 2024-06-13 ENCOUNTER — Other Ambulatory Visit: Payer: Self-pay

## 2024-06-13 VITALS — BP 122/74 | HR 76 | Ht 74.0 in | Wt 172.0 lb

## 2024-06-13 DIAGNOSIS — G20B2 Parkinson's disease with dyskinesia, with fluctuations: Secondary | ICD-10-CM

## 2024-06-13 DIAGNOSIS — M9904 Segmental and somatic dysfunction of sacral region: Secondary | ICD-10-CM

## 2024-06-13 DIAGNOSIS — M9901 Segmental and somatic dysfunction of cervical region: Secondary | ICD-10-CM

## 2024-06-13 DIAGNOSIS — M24551 Contracture, right hip: Secondary | ICD-10-CM | POA: Diagnosis not present

## 2024-06-13 DIAGNOSIS — M9903 Segmental and somatic dysfunction of lumbar region: Secondary | ICD-10-CM

## 2024-06-13 DIAGNOSIS — M9908 Segmental and somatic dysfunction of rib cage: Secondary | ICD-10-CM | POA: Diagnosis not present

## 2024-06-13 DIAGNOSIS — M9902 Segmental and somatic dysfunction of thoracic region: Secondary | ICD-10-CM | POA: Diagnosis not present

## 2024-06-13 MED ORDER — RYTARY 23.75-95 MG PO CPCR: ORAL_CAPSULE | ORAL | 0 refills | Status: DC

## 2024-06-13 NOTE — Assessment & Plan Note (Signed)
 Continued tightness noted within his multifactorial.  Some secondary to the Parkinson's, some secondary to other chronic comorbidities.  Will increase stretching routine.  Discussed the eccentric's.  Increase activity slowly.  Follow-up again in 6 to 8 weeks

## 2024-06-17 NOTE — Progress Notes (Signed)
 [facility] Counselor Initial Adult Exam  Name: Ian [name] Date: 06/17/2024 MRN: [id number] DOB: [birth year] PCP: [name], MD     [name] participated from home, via video, and consented to treatment. Therapist participated from home office.    Guardian/Payee:  N/A    Paperwork requested: No   Reason for Visit /Presenting Problem: chronic anxiety  Mental Status Exam: Appearance:   Casual     Behavior:  Appropriate  Motor:  Normal  Speech/Language:   Rapid cadence and moderate stutter  Affect:  Appropriate  Mood:  anxious  Thought process:  normal  Thought content:    WNL  Sensory/Perceptual disturbances:    WNL  Orientation:  oriented to person, place, and situation  Attention:  Good  Concentration:  Good  Memory:  WNL  Fund of knowledge:   Good  Insight:    Good  Judgment:   Good  Impulse Control:  Good    Reported Symptoms:  worry, phobia (public speaking), stutter, rate of speech  Risk Assessment: Danger to Self:  No Self-injurious Behavior: No Danger to Others: No Duty to Warn:no Physical Aggression / Violence:No  Access to Firearms a concern: unknown Gang Involvement:No  Patient / guardian was educated about steps to take if suicide or homicide risk level increases between visits: n/a While future psychiatric events cannot be accurately predicted, the patient does not currently require acute inpatient psychiatric care and does not currently meet [place]  involuntary commitment criteria.  Substance Abuse History: Current substance abuse: No     Past Psychiatric History:   No previous psychological  problems have been observed Outpatient Providers:N/A History of Psych Hospitalization: No  Psychological Testing: N/A   Abuse History:  Victim of: No., N/A   Report needed: No. Victim of Neglect:No. Perpetrator of N/A  Witness / Exposure to Domestic Violence: No   Protective Services Involvement: No  Witness to [employer] Violence:  No   Family History:  Family History  Problem Relation Age of Onset   Breast cancer Mother    Diabetes Father    Healthy Sister    Healthy Daughter    Healthy Son    Diabetes Other    Cancer Other     Living situation: the patient lives with their spouse  Sexual Orientation: Straight  Relationship Status: married  Name of spouse / other:unknown If a parent, number of children / ages:son 23 and 44 year old twin daughters  Support Systems: spouse Nutritional therapist Stress:  No  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 History of legal issue / charges: N/A  Medical History/Surgical History: reviewed Past Medical History:  Diagnosis Date   Anxiety    History of kidney stones    passed   PD (Parkinson's disease) (HCC) 09/10/2013   PONV (postoperative nausea and vomiting)    nausea     Past Surgical History:  Procedure Laterality Date   COLONOSCOPY     MINOR PLACEMENT OF FIDUCIAL N/A 01/31/2017   Procedure: MINOR PLACEMENT OF FIDUCIAL;  Surgeon: [name], MD;  Location: [facility] OR;  Service: Neurosurgery;  Laterality: N/A;  Fiducial placement    none     PULSE GENERATOR IMPLANT Right 02/17/2017   Procedure: Right implantable pulse generator placement;  Surgeon: [name], MD;  Location: [facility] OR;  Service: Neurosurgery;  Laterality: Right;  Bilateral implantable pulse generator placement   SUBTHALAMIC STIMULATOR BATTERY REPLACEMENT N/A 10/19/2018   Procedure: Deep Brain stimulator implantable pulse generator revision;  Surgeon: Levels Fairy, MD;  Location: [facility] OR;  Service: Neurosurgery;  Laterality: N/A;  Deep Brain stimulator implantable pulse generator revision   SUBTHALAMIC STIMULATOR INSERTION Bilateral 02/10/2017   Procedure: Bilateral Deep brain stimulator placement;  Surgeon: [name], MD;  Location: [facility] OR;  Service: Neurosurgery;  Laterality: Bilateral;  Bilateral Deep brain stimulator placement   UPPER GASTROINTESTINAL ENDOSCOPY      Medications: see above list.      Initial Session: Diagnosed with Parkinsons in 2014. He stutters and is trying to determine if his speech problems are anxiety/psychological or physical. He says he has had anxiety for a long time. Says he worries about things all the time. He says his mom had issues when he was growing up (addicted to pain meds) and that contributed to his chronic worry. He does think his anxiety is better than it was in the past. He is strongly spiritual. 62 year old son and 62 year old twin daughters. No depressive symptoms reported. Went on disability in 2014. He was on Ativan , but got off of it out of fear that it would become addicting to him. Married for 32 years and good relationship. 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 That was 2006. He was 22 years in [employer]. 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 His biggest issue currently is his speech in that by the end of the day, hard for people to understand him. Speech got bad in past 2-3 years. He goes to exercise classes  for Parkinson's patients. Wife doesn't work. Strong supportive church family since 2007. Plays golf weekly and works out with Systems analyst 2 times a week. Sees kids often and just had first grandchild.  FOO: Father had diabetes. Fraternal grandfather was alcoholic and spent time in jail. Mother dies of breast cancer later in life. Father died during pandemic at 57 (in 2021). He has a twin sister in [place] and they ar very close. He isn't as close to some of his guys friends lately and he  thinks he pulled back due to the speech issues. Hasn't shared with everyone that he has Parkinson's. He lost his closest lifelong friend in 2021. This was 3 months after his father passed.  He has tried breathing exercises to reduce his anxiety with mild results. Going for a walk or exercise helps anxiety the best. Reports sleep is good. Told him to keep an anxiety journal with rating anxiety on a 0-10 scale. Also recommended The Anxiety and Phobia Workbook. He has become phobic about public speaking.     Goals/Treatment Plan: Patient seeking counseling to reduce above named symptoms of anxiety. He is also working on adjusting to his Parkinson's diagnosis. Hoping to improve speech issues that are related to his anxiety and regain self-confidence. Will employ behavioral strates and insight oriented psychotherapy interventions. Goal Date is 12-25.   Diagnoses:  Generalized Anxiety Disorder  Plan of Care: Outpatient Psychotherapy  Patient was seen via video session (Caregility) and is aware of the platform limitations. He is at home and I am at office. Session Note: Patient says that if was a tough couple of weeks after the device adjustment. He expects to finally hear today about the Speak Easy device and hopes it will be approved. He says things went well at the [employer] anniversary. He was worried about how he would tolerate the events, but they all went well. Used deep breathing and positive cognitions  to get through. He states the meds are not wearing off as quickly with his new prescriptions (time release).  He reports that daughter shared her post partum depression symptoms with he and his wife. Concerned about her but optimistic. It is a challenge having them live in household and we discussed best ways to manage. He read chapter on feelings and found t enlightening. In particular, he was struck by his own tendency to suppress his feelings. He avoids conflict and strives to keep the peace. Expressed the challenge of articulating how he feels.                            CONI ALM KERNS, PhD 8:40a-9:30a 50 minutes                                                                                                                                                                                        [facility] Counselor Initial Adult Exam  Name: Minard Millirons Date: 06/17/2024 MRN: [id number] DOB: [birth year] PCP: Pura ALM, MD     [name] participated from home, via video, and consented to treatment. Therapist participated from home office.    Guardian/Payee:  N/A    Paperwork requested: No   Reason for Visit /Presenting Problem: chronic anxiety  Mental Status Exam: Appearance:   Casual     Behavior:  Appropriate  Motor:  Normal  Speech/Language:   Rapid cadence and moderate stutter  Affect:  Appropriate  Mood:  anxious  Thought process:  normal  Thought content:    WNL  Sensory/Perceptual disturbances:    WNL  Orientation:  oriented to person, place, and situation  Attention:  Good  Concentration:  Good  Memory:  WNL  Fund of knowledge:   Good  Insight:    Good  Judgment:   Good  Impulse Control:  Good    Reported Symptoms:  worry, phobia (public speaking),  stutter, rate of speech  Risk Assessment: Danger to Self:  No Self-injurious Behavior: No Danger to Others: No Duty to Warn:no Physical Aggression / Violence:No  Access to Firearms a concern: unknown Gang Involvement:No  Patient / guardian was educated about steps to take if suicide or homicide risk level increases between visits: n/a While future psychiatric events cannot be accurately predicted, the patient does not currently require acute inpatient psychiatric care and does not currently meet [place]  involuntary commitment criteria.  Substance Abuse History: Current substance abuse: No     Past Psychiatric History:   No previous psychological problems have been observed Outpatient Providers:N/A History of Psych Hospitalization: No  Psychological Testing: N/A   Abuse History:  Victim of: No., N/A   Report needed: No. Victim of Neglect:No. Perpetrator of N/A  Witness / Exposure to Domestic Violence: No   Protective Services Involvement: No  Witness to [employer] Violence:  No   Family History:  Family History  Problem Relation Age of Onset   Breast cancer Mother    Diabetes Father    Healthy Sister    Healthy Daughter    Healthy Son    Diabetes Other    Cancer Other     Living situation: the patient lives with their spouse  Sexual Orientation: Straight  Relationship Status: married  Name of spouse / other:unknown If a parent, number of children / ages:son 89 and 75 year old twin daughters  Support Systems: spouse Nutritional therapist Stress:  No   Income/Employment/Disability: Long-Term Research officer, political party Service: Yes   Educational History: Education: Risk manager: unknown  Any cultural differences that may affect / interfere with treatment:  N/A  Recreation/Hobbies: exercise, golf  Stressors: Health problems    Strengths: Supportive Relationships, Family, and  Church  Barriers:  social withdrawal   Legal History: Pending legal issue / charges: The patient has no significant history of legal issues. History of legal issue / charges: N/A  Medical History/Surgical History: reviewed Past Medical History:  Diagnosis Date   Anxiety    History of kidney stones    passed   PD (Parkinson's disease) (HCC) 09/10/2013   PONV (postoperative nausea and vomiting)    nausea     Past Surgical History:  Procedure Laterality Date   COLONOSCOPY     MINOR PLACEMENT OF FIDUCIAL N/A 01/31/2017   Procedure: MINOR PLACEMENT OF FIDUCIAL;  Surgeon: [name], MD;  Location[facility] OR;  Service: Neurosurgery;  Laterality: N/A;  Fiducial placement   none     PULSE GENERATOR IMPLANT Right 02/17/2017   Procedure: Right implantable pulse generator placement;  Surgeon: [name], MD;  Location: [facility] OR;  Service: Neurosurgery;  Laterality: Right;  Bilateral implantable pulse  generator placement   SUBTHALAMIC STIMULATOR BATTERY REPLACEMENT N/A 10/19/2018   Procedure: Deep Brain stimulator implantable pulse generator revision;  Surgeon: Unice Pac, MD;  Location: [facility] OR;  Service: Neurosurgery;  Laterality: N/A;  Deep Brain stimulator implantable pulse generator revision   SUBTHALAMIC STIMULATOR INSERTION Bilateral 02/10/2017   Procedure: Bilateral Deep brain stimulator placement;  Surgeon: Pac Unice, MD;  Location: [facility] OR;  Service: Neurosurgery;  Laterality: Bilateral;  Bilateral Deep brain stimulator placement   UPPER GASTROINTESTINAL ENDOSCOPY      Medications: see above list.      Initial Session: Diagnosed with Parkinsons in 2014. He stutters and is trying to determine if his speech problems are anxiety/psychological or physical. He says he has had anxiety for a long time. Says he worries about things all the time. He says his mom had issues when he was growing up (addicted to pain meds) and that contributed to his chronic worry. He does think his anxiety is  better than it was in the past. He is strongly spiritual. 40 year old son and 19 year old twin daughters. No depressive symptoms reported. Went on disability in 2014. He was on Ativan , but got off of it out of fear that it would become addicting to him. Married for 32 years and good relationship. 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 That was 2006. He was 22 years in [employer]. 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 His biggest issue currently is his speech in that by the end of the day, hard for people to understand him. Speech got bad in past 2-3 years. He goes to exercise classes for Parkinson's patients. Wife doesn't work. Strong supportive church family since 2007. Plays golf weekly and works out with Systems analyst 2 times a week. Sees kids often and just had first grandchild.  FOO: Father had diabetes. Fraternal grandfather was alcoholic and spent time in jail. Mother dies of breast cancer later in life. Father died during pandemic at 17 (in 2021). He has a twin sister in [place] and they ar very close. He isn't as close to some of his guys friends lately and he thinks he pulled back due to the speech issues. Hasn't shared with everyone that he has Parkinson's. He lost his closest lifelong friend in 2021. This was 3 months after his father passed.  He has tried breathing exercises to reduce his anxiety with mild results. Going for a walk or exercise helps anxiety the best. Reports sleep is good. Told him to keep an anxiety journal with rating anxiety on a 0-10 scale. Also recommended The Anxiety and Phobia Workbook. He has become phobic about public speaking.     Goals/Treatment Plan: Patient seeking counseling to reduce above named symptoms of anxiety. He is also working on adjusting to his Parkinson's diagnosis. Hoping to improve speech issues that are related to his anxiety and regain  self-confidence. Will employ behavioral strates and insight oriented psychotherapy interventions. Goal Date is 12-25.   Diagnoses:  Generalized Anxiety Disorder  Plan of Care: Outpatient Psychotherapy  Patient was seen via video session (Caregility) and is aware of the platform limitations. He is at home and I am at office. Session Note: Patient reports that he had his appointment for his device last week and that it worked well. He is thrilled and relieved. His excitement was dampened when he was subsequently told that  the referral was out of network. The insurance situation at the [place] is complex and slow. He is frustrated and angry, but working hard to stay busy and positive. Talked about various options that he is considering. The fact that the device works makes his situation all the more frustrating. He is managing to keep his moods even and not lose control.                           CONI ALM KERNS, PhD 10:40a-11:30a 50 minutes

## 2024-06-24 ENCOUNTER — Ambulatory Visit (INDEPENDENT_AMBULATORY_CARE_PROVIDER_SITE_OTHER): Admitting: Psychology

## 2024-06-24 DIAGNOSIS — F411 Generalized anxiety disorder: Secondary | ICD-10-CM

## 2024-06-24 NOTE — Progress Notes (Signed)
 [facility] Counselor Initial Adult Exam  Name: Petra Sargeant Date: 06/24/2024 MRN: [id number] DOB: 06/19/1961 PCP: [name], MD     [name] participated from home, via video, and consented to treatment. Therapist participated from home office.    Guardian/Payee:  N/A    Paperwork requested: No   Reason for Visit /Presenting Problem: chronic anxiety  Mental Status Exam: Appearance:   Casual     Behavior:  Appropriate  Motor:  Normal  Speech/Language:   Rapid cadence and moderate stutter  Affect:  Appropriate  Mood:  anxious  Thought process:  normal  Thought content:    WNL  Sensory/Perceptual disturbances:    WNL  Orientation:  oriented to person, place, and situation  Attention:  Good  Concentration:  Good  Memory:  WNL  Fund of knowledge:   Good  Insight:    Good  Judgment:   Good  Impulse Control:  Good    Reported Symptoms:  worry, phobia (public speaking), stutter, rate of speech  Risk Assessment: Danger to Self:  No Self-injurious Behavior: No Danger to Others: No Duty to Warn:no Physical Aggression / Violence:No  Access to Firearms a concern: unknown Gang Involvement:No  Patient / guardian was educated about steps to take if suicide or homicide risk level increases between visits: n/a While future psychiatric events cannot be accurately predicted, the patient does not currently require acute inpatient psychiatric care and does not currently meet [place]  involuntary commitment criteria.  Substance Abuse History: Current substance abuse: No     Past Psychiatric History:    No previous psychological problems have been observed Outpatient Providers:N/A History of Psych Hospitalization: No  Psychological Testing: N/A   Abuse History:  Victim of: No., N/A   Report needed: No. Victim of Neglect:No. Perpetrator of N/A  Witness / Exposure to Domestic Violence: No   Protective Services Involvement: No  Witness to [employer] Violence:  No   Family History:  Family History  Problem Relation Age of Onset   Breast cancer Mother    Diabetes Father    Healthy Sister    Healthy Daughter    Healthy Son    Diabetes Other    Cancer Other     Living situation: the patient lives with their spouse  Sexual Orientation: Straight  Relationship Status: married  Name of spouse / other:unknown If a parent, number of children / ages:son 59 and 57 year old  twin daughters  Support Systems: spouse Nutritional therapist Stress:  No   Income/Employment/Disability: Long-Term Research officer, political party Service: Yes   Educational History: Education: Risk manager: unknown  Any cultural differences that may affect / interfere with treatment:  N/A  Recreation/Hobbies: exercise, golf  Stressors: Health problems    Strengths: Supportive Relationships, Family, and Church  Barriers:  social withdrawal   Legal History: Pending legal issue / charges: The patient has no significant history of legal issues. History of legal issue / charges: N/A  Medical History/Surgical History: reviewed Past Medical History:  Diagnosis Date   Anxiety    History of kidney stones    passed   PD (Parkinson's disease) (HCC) 09/10/2013   PONV (postoperative nausea and vomiting)    nausea     Past Surgical History:  Procedure Laterality Date   COLONOSCOPY     MINOR PLACEMENT OF FIDUCIAL N/A 01/31/2017   Procedure: MINOR PLACEMENT OF FIDUCIAL;  Surgeon: [name], MD;  Location: [facility] OR;  Service: Neurosurgery;  Laterality:  N/A;  Fiducial placement   none     PULSE GENERATOR IMPLANT Right 02/17/2017   Procedure: Right implantable pulse generator placement;  Surgeon: [name], MD;  Location: [facility] OR;  Service: Neurosurgery;  Laterality: Right;  Bilateral implantable pulse generator placement   SUBTHALAMIC STIMULATOR BATTERY REPLACEMENT N/A 10/19/2018   Procedure: Deep Brain stimulator implantable pulse generator revision;  Surgeon: Levels Fairy, MD;  Location: [facility] OR;  Service: Neurosurgery;  Laterality: N/A;  Deep Brain stimulator implantable pulse generator revision   SUBTHALAMIC STIMULATOR INSERTION Bilateral 02/10/2017   Procedure: Bilateral Deep brain stimulator placement;  Surgeon: [name], MD;  Location[facility] OR;  Service: Neurosurgery;  Laterality: Bilateral;  Bilateral Deep brain stimulator placement   UPPER GASTROINTESTINAL ENDOSCOPY      Medications: see above list.      Initial Session: Diagnosed with Parkinsons in 2014. He stutters and is trying to determine if his speech problems are anxiety/psychological or physical. He says he has had anxiety for a long time. Says he worries about things all the time. He says his mom had issues when he was growing up (addicted to pain meds) and that contributed to his chronic worry. He does think his anxiety is better than it was in the past. He is strongly spiritual. 38 year old son and 62 year old twin daughters. No depressive symptoms reported. Went on disability in 2014. He was on Ativan , but got off of it out of fear that it would become addicting to him. Married for 32 years and good relationship. 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 That was 2006. He was 22 years in [employer]. 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 His biggest issue currently is his speech in that by the end of the day, hard for people to understand him. Speech got bad in past 2-3 years.  He goes to exercise classes for Parkinson's patients. Wife doesn't work. Strong supportive church family since 2007. Plays golf weekly and works out with Systems analyst 2 times a week. Sees kids often and just had first grandchild.  FOO: Father had diabetes. Fraternal grandfather was alcoholic and spent time in jail. Mother dies of breast cancer later in life. Father died during pandemic at 62 (in 2021). He has a twin sister in [place] and they ar  very close. He isn't as close to some of his guys friends lately and he thinks he pulled back due to the speech issues. Hasn't shared with everyone that he has Parkinson's. He lost his closest lifelong friend in 2021. This was 3 months after his father passed.  He has tried breathing exercises to reduce his anxiety with mild results. Going for a walk or exercise helps anxiety the best. Reports sleep is good. Told him to keep an anxiety journal with rating anxiety on a 0-10 scale. Also recommended The Anxiety and Phobia Workbook. He has become phobic about public speaking.     Goals/Treatment Plan: Patient seeking counseling to reduce above named symptoms of anxiety. He is also working on adjusting to his Parkinson's diagnosis. Hoping to improve speech issues that are related to his anxiety and regain self-confidence. Will employ behavioral strates and insight oriented psychotherapy interventions. Goal Date is 12-25.   Diagnoses:  Generalized Anxiety Disorder  Plan of Care: Outpatient Psychotherapy  Patient was seen via video session (Caregility) and is aware of the platform limitations. He is at home and I am at office. Session Note: Patient says that if was a tough couple of weeks after the device adjustment. He expects to finally hear today about the Speak Easy device and hopes it will be approved. He says things went well at the [employer] anniversary. He was worried about how he would tolerate the events, but they all went well. Used deep  breathing and positive cognitions to get through. He states the meds are not wearing off as quickly with his new prescriptions (time release).  He reports that daughter shared her post partum depression symptoms with he and his wife. Concerned about her but optimistic. It is a challenge having them live in household and we discussed best ways to manage. He read chapter on feelings and found t enlightening. In particular, he was struck by his own tendency to suppress his feelings. He avoids conflict and strives to keep the peace. Expressed the challenge of articulating how he feels.                            CONI ALM KERNS, PhD 8:40a-9:30a 50 minutes                                                                                                                                                                                        [facility] Counselor Initial Adult Exam  Name: Roshun Klingensmith Date: 06/24/2024 MRN: [id number] DOB: [birth year] PCP: Pura ALM, MD     [name] participated from home, via  video, and consented to treatment. Therapist participated from home office.    Guardian/Payee:  N/A    Paperwork requested: No   Reason for Visit /Presenting Problem: chronic anxiety  Mental Status Exam: Appearance:   Casual     Behavior:  Appropriate  Motor:  Normal  Speech/Language:   Rapid cadence and moderate stutter  Affect:  Appropriate  Mood:  anxious  Thought process:  normal  Thought content:    WNL  Sensory/Perceptual disturbances:    WNL  Orientation:  oriented to person, place, and situation  Attention:  Good  Concentration:  Good  Memory:  WNL  Fund of knowledge:   Good  Insight:    Good  Judgment:   Good  Impulse Control:  Good    Reported Symptoms:  worry,  phobia (public speaking), stutter, rate of speech  Risk Assessment: Danger to Self:  No Self-injurious Behavior: No Danger to Others: No Duty to Warn:no Physical Aggression / Violence:No  Access to Firearms a concern: unknown Gang Involvement:No  Patient / guardian was educated about steps to take if suicide or homicide risk level increases between visits: n/a While future psychiatric events cannot be accurately predicted, the patient does not currently require acute inpatient psychiatric care and does not currently meet [place]  involuntary commitment criteria.  Substance Abuse History: Current substance abuse: No     Past Psychiatric History:   No previous psychological problems have been observed Outpatient Providers:N/A History of Psych Hospitalization: No  Psychological Testing: N/A   Abuse History:  Victim of: No., N/A   Report needed: No. Victim of Neglect:No. Perpetrator of N/A  Witness / Exposure to Domestic Violence: No   Protective Services Involvement: No  Witness to [employer] Violence:  No   Family History:  Family History  Problem Relation Age of Onset   Breast cancer Mother    Diabetes Father    Healthy Sister    Healthy Daughter    Healthy Son    Diabetes Other    Cancer Other     Living situation: the patient lives with their spouse  Sexual Orientation: Straight  Relationship Status: married  Name of spouse / other:unknown If a parent, number of children / ages:son 43 and 42 year old twin daughters  Support Systems: spouse Nutritional therapist Stress:  No   Income/Employment/Disability: Long-Term Research officer, political party Service: Yes   Educational History: Education: Risk manager: unknown  Any cultural differences that may affect / interfere with treatment:  N/A  Recreation/Hobbies: exercise, golf  Stressors: Health problems    Strengths: Supportive Relationships,  Family, and Church  Barriers:  social withdrawal   Legal History: Pending legal issue / charges: The patient has no significant history of legal issues. History of legal issue / charges: N/A  Medical History/Surgical History: reviewed Past Medical History:  Diagnosis Date   Anxiety    History of kidney stones    passed   PD (Parkinson's disease) (HCC) 09/10/2013   PONV (postoperative nausea and vomiting)    nausea     Past Surgical History:  Procedure Laterality Date   COLONOSCOPY     MINOR PLACEMENT OF FIDUCIAL N/A 01/31/2017   Procedure: MINOR PLACEMENT OF FIDUCIAL;  Surgeon: [name], MD;  Location: [facility] OR;  Service: Neurosurgery;  Laterality: N/A;  Fiducial placement   none     PULSE GENERATOR IMPLANT Right 02/17/2017   Procedure: Right implantable pulse generator placement;  Surgeon: [name],  MD;  Location: [facility] OR;  Service: Neurosurgery;  Laterality: Right;  Bilateral implantable pulse generator placement   SUBTHALAMIC STIMULATOR BATTERY REPLACEMENT N/A 10/19/2018   Procedure: Deep Brain stimulator implantable pulse generator revision;  Surgeon: Unice Pac, MD;  Location: [facility] OR;  Service: Neurosurgery;  Laterality: N/A;  Deep Brain stimulator implantable pulse generator revision   SUBTHALAMIC STIMULATOR INSERTION Bilateral 02/10/2017   Procedure: Bilateral Deep brain stimulator placement;  Surgeon: Pac Unice, MD;  Location: [facility] OR;  Service: Neurosurgery;  Laterality: Bilateral;  Bilateral Deep brain stimulator placement   UPPER GASTROINTESTINAL ENDOSCOPY      Medications: see above list.      Initial Session: Diagnosed with Parkinsons in 2014. He stutters and is trying to determine if his speech problems are anxiety/psychological or physical. He says he has had anxiety for a long time. Says he worries about things all the time. He says his mom had issues when he was growing up (addicted to pain meds) and that contributed to his chronic worry. He does think his  anxiety is better than it was in the past. He is strongly spiritual. 8 year old son and 76 year old twin daughters. No depressive symptoms reported. Went on disability in 2014. He was on Ativan , but got off of it out of fear that it would become addicting to him. Married for 32 years and good relationship. 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 That was 2006. He was 22 years in [employer]. 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 His biggest issue currently is his speech in that by the end of the day, hard for people to understand him. Speech got bad in past 2-3 years. He goes to exercise classes for Parkinson's patients. Wife doesn't work. Strong supportive church family since 2007. Plays golf weekly and works out with Systems analyst 2 times a week. Sees kids often and just had first grandchild.  FOO: Father had diabetes. Fraternal grandfather was alcoholic and spent time in jail. Mother dies of breast cancer later in life. Father died during pandemic at 41 (in 2021). He has a twin sister in [place] and they ar very close. He isn't as close to some of his guys friends lately and he thinks he pulled back due to the speech issues. Hasn't shared with everyone that he has Parkinson's. He lost his closest lifelong friend in 2021. This was 3 months after his father passed.  He has tried breathing exercises to reduce his anxiety with mild results. Going for a walk or exercise helps anxiety the best. Reports sleep is good. Told him to keep an anxiety journal with rating anxiety on a 0-10 scale. Also recommended The Anxiety and Phobia Workbook. He has become phobic about public speaking.     Goals/Treatment Plan: Patient seeking counseling to reduce above named symptoms of anxiety. He is also working on adjusting to his Parkinson's diagnosis. Hoping to improve speech issues that are related to his anxiety  and regain self-confidence. Will employ behavioral strates and insight oriented psychotherapy interventions. Goal Date is 12-25.   Diagnoses:  Generalized Anxiety Disorder  Plan of Care: Outpatient Psychotherapy  Patient was seen via video session (Caregility) and is aware of the platform limitations. He is at home and I am at office. Session Note: Patient says that the beach trip was really nice. Will be going on a church trip to  [employer]. next week. [facility] and hopes to move along his request for the speech machine. It has been a long process and frustrating, but looking like he may get resolution soon. Speech difficult today due to the time of appointment. Remaining optimistic and will practice relaxation during the days and not just at night.                        CONI ALM KERNS, PhD 3:15p-4:00p 45 minutes

## 2024-07-08 ENCOUNTER — Ambulatory Visit (INDEPENDENT_AMBULATORY_CARE_PROVIDER_SITE_OTHER): Admitting: Psychology

## 2024-07-08 DIAGNOSIS — F411 Generalized anxiety disorder: Secondary | ICD-10-CM

## 2024-07-08 NOTE — Progress Notes (Signed)
 [facility] Counselor Initial Adult Exam  Name: Ian [name] Date: 07/08/2024 MRN: [id number] DOB: Aug 14, 1961 PCP: [name], MD     [name] participated from home, via video, and consented to treatment. Therapist participated from home office.    Guardian/Payee:  N/A    Paperwork requested: No   Reason for Visit /Presenting Problem: chronic anxiety  Mental Status Exam: Appearance:   Casual     Behavior:  Appropriate  Motor:  Normal  Speech/Language:   Rapid cadence and moderate stutter  Affect:  Appropriate  Mood:  anxious  Thought process:  normal  Thought content:    WNL  Sensory/Perceptual disturbances:    WNL  Orientation:  oriented to person, place, and situation  Attention:  Good  Concentration:  Good  Memory:  WNL  Fund of knowledge:   Good  Insight:    Good  Judgment:   Good  Impulse Control:  Good    Reported Symptoms:  worry, phobia (public speaking), stutter, rate of speech  Risk Assessment: Danger to Self:  No Self-injurious Behavior: No Danger to Others: No Duty to Warn:no Physical Aggression / Violence:No  Access to Firearms a concern: unknown Gang Involvement:No  Patient / guardian was educated about steps to take if suicide or homicide risk level increases between visits: n/a While future psychiatric events cannot be accurately predicted, the patient does not currently require acute inpatient psychiatric care and does not currently meet [place]  involuntary commitment criteria.  Substance Abuse History: Current substance abuse: No      Past Psychiatric History:   No previous psychological problems have been observed Outpatient Providers:N/A History of Psych Hospitalization: No  Psychological Testing: N/A   Abuse History:  Victim of: No., N/A   Report needed: No. Victim of Neglect:No. Perpetrator of N/A  Witness / Exposure to Domestic Violence: No   Protective Services Involvement: No  Witness to [employer] Violence:  No   Family History:  Family History  Problem Relation Age of Onset   Breast cancer Mother    Diabetes Father    Healthy Sister    Healthy Daughter    Healthy Son    Diabetes Other    Cancer Other     Living situation: the patient lives with their spouse  Sexual Orientation: Straight  Relationship Status: married  Name of spouse /  other:unknown If a parent, number of children / ages:son 107 and 76 year old twin daughters  Support Systems: spouse Nutritional therapist Stress:  No   Income/Employment/Disability: Long-Term Research officer, political party Service: Yes   Educational History: Education: Risk manager: unknown  Any cultural differences that may affect / interfere with treatment:  N/A  Recreation/Hobbies: exercise, golf  Stressors: Health problems    Strengths: Supportive Relationships, Family, and Church  Barriers:  social withdrawal   Legal History: Pending legal issue / charges: The patient has no significant history of legal issues. History of legal issue / charges: N/A  Medical History/Surgical History: reviewed Past Medical History:  Diagnosis Date   Anxiety    History of kidney stones    passed   PD (Parkinson's disease) (HCC) 09/10/2013   PONV (postoperative nausea and vomiting)    nausea     Past Surgical History:  Procedure Laterality Date   COLONOSCOPY     MINOR PLACEMENT OF FIDUCIAL N/A 01/31/2017   Procedure: MINOR PLACEMENT OF FIDUCIAL;  Surgeon: [name], MD;  Location: [facility] OR;   Service: Neurosurgery;  Laterality: N/A;  Fiducial placement   none     PULSE GENERATOR IMPLANT Right 02/17/2017   Procedure: Right implantable pulse generator placement;  Surgeon: [name], MD;  Location: [facility] OR;  Service: Neurosurgery;  Laterality: Right;  Bilateral implantable pulse generator placement   SUBTHALAMIC STIMULATOR BATTERY REPLACEMENT N/A 10/19/2018   Procedure: Deep Brain stimulator implantable pulse generator revision;  Surgeon: Levels Fairy, MD;  Location: [facility] OR;  Service: Neurosurgery;  Laterality: N/A;  Deep Brain stimulator implantable pulse generator revision   SUBTHALAMIC STIMULATOR INSERTION Bilateral 02/10/2017   Procedure: Bilateral Deep brain stimulator placement;  Surgeon: [name], MD;  Location: [facility] OR;  Service: Neurosurgery;  Laterality: Bilateral;  Bilateral Deep brain stimulator placement   UPPER GASTROINTESTINAL ENDOSCOPY      Medications: see above list.      Initial Session: Diagnosed with Parkinsons in 2014. He stutters and is trying to determine if his speech problems are anxiety/psychological or physical. He says he has had anxiety for a long time. Says he worries about things all the time. He says his mom had issues when he was growing up (addicted to pain meds) and that contributed to his chronic worry. He does think his anxiety is better than it was in the past. He is strongly spiritual. 62 year old son and 62 year old twin daughters. No depressive symptoms reported. Went on disability in 2014. He was on Ativan , but got off of it out of fear that it would become addicting to him. Married for 32 years and good relationship. 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 That was 2006. He was 22 years in [employer]. 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 His biggest issue currently is his speech in that by the end of the day, hard for people to understand  him. Speech got bad in past 2-3 years. He goes to exercise classes for Parkinson's patients. Wife doesn't work. Strong supportive church family since 2007. Plays golf weekly and works out with Systems analyst 2 times a week. Sees kids often and just had first grandchild.  FOO: Father had diabetes. Fraternal grandfather was alcoholic and spent time in jail. Mother dies of breast cancer later in life. Father died during pandemic  at 13 (in 2021). He has a twin sister in [place] and they ar very close. He isn't as close to some of his guys friends lately and he thinks he pulled back due to the speech issues. Hasn't shared with everyone that he has Parkinson's. He lost his closest lifelong friend in 2021. This was 3 months after his father passed.  He has tried breathing exercises to reduce his anxiety with mild results. Going for a walk or exercise helps anxiety the best. Reports sleep is good. Told him to keep an anxiety journal with rating anxiety on a 0-10 scale. Also recommended The Anxiety and Phobia Workbook. He has become phobic about public speaking.     Goals/Treatment Plan: Patient seeking counseling to reduce above named symptoms of anxiety. He is also working on adjusting to his Parkinson's diagnosis. Hoping to improve speech issues that are related to his anxiety and regain self-confidence. Will employ behavioral strates and insight oriented psychotherapy interventions. Goal Date is 12-25.   Diagnoses:  Generalized Anxiety Disorder  Plan of Care: Outpatient Psychotherapy  Patient was seen via video session (Caregility) and is aware of the platform limitations. He is at home and I am at office. Session Note: Patient says that if was a tough couple of weeks after the device adjustment. He expects to finally hear today about the Speak Easy device and hopes it will be approved. He says things went well at the [employer] anniversary. He was worried about how he would tolerate the events,  but they all went well. Used deep breathing and positive cognitions to get through. He states the meds are not wearing off as quickly with his new prescriptions (time release).  He reports that daughter shared her post partum depression symptoms with he and his wife. Concerned about her but optimistic. It is a challenge having them live in household and we discussed best ways to manage. He read chapter on feelings and found t enlightening. In particular, he was struck by his own tendency to suppress his feelings. He avoids conflict and strives to keep the peace. Expressed the challenge of articulating how he feels.                            CONI ALM KERNS, PhD 8:40a-9:30a 50 minutes                                                                                                                                                                                        [facility] Counselor Initial Adult Exam  Name: Ian [name] Date: 07/08/2024 MRN: [id number] DOB: 08-27-1961  PCP: [name], MD     [name] participated from home, via video, and consented to treatment. Therapist participated from home office.    Guardian/Payee:  N/A    Paperwork requested: No   Reason for Visit /Presenting Problem: chronic anxiety  Mental Status Exam: Appearance:   Casual     Behavior:  Appropriate  Motor:  Normal  Speech/Language:   Rapid cadence and moderate stutter  Affect:  Appropriate  Mood:  anxious  Thought process:  normal  Thought content:    WNL  Sensory/Perceptual disturbances:    WNL  Orientation:  oriented to person, place, and situation  Attention:  Good  Concentration:  Good  Memory:  WNL  Fund of knowledge:   Good  Insight:    Good  Judgment:   Good  Impulse Control:   Good    Reported Symptoms:  worry, phobia (public speaking), stutter, rate of speech  Risk Assessment: Danger to Self:  No Self-injurious Behavior: No Danger to Others: No Duty to Warn:no Physical Aggression / Violence:No  Access to Firearms a concern: unknown Gang Involvement:No  Patient / guardian was educated about steps to take if suicide or homicide risk level increases between visits: n/a While future psychiatric events cannot be accurately predicted, the patient does not currently require acute inpatient psychiatric care and does not currently meet [place]  involuntary commitment criteria.  Substance Abuse History: Current substance abuse: No     Past Psychiatric History:   No previous psychological problems have been observed Outpatient Providers:N/A History of Psych Hospitalization: No  Psychological Testing: N/A   Abuse History:  Victim of: No., N/A   Report needed: No. Victim of Neglect:No. Perpetrator of N/A  Witness / Exposure to Domestic Violence: No   Protective Services Involvement: No  Witness to [employer] Violence:  No   Family History:  Family History  Problem Relation Age of Onset   Breast cancer Mother    Diabetes Father    Healthy Sister    Healthy Daughter    Healthy Son    Diabetes Other    Cancer Other     Living situation: the patient lives with their spouse  Sexual Orientation: Straight  Relationship Status: married  Name of spouse / other:unknown If a parent, number of children / ages:son 46 and 75 year old twin daughters  Support Systems: spouse Nutritional therapist Stress:  No   Income/Employment/Disability: Long-Term Research officer, political party Service: Yes   Educational History: Education: Risk manager: unknown  Any cultural differences that may affect / interfere with treatment:  N/A  Recreation/Hobbies: exercise, golf  Stressors: Health problems     Strengths: Supportive Relationships, Family, and Church  Barriers:  social withdrawal   Legal History: Pending legal issue / charges: The patient has no significant history of legal issues. History of legal issue / charges: N/A  Medical History/Surgical History: reviewed Past Medical History:  Diagnosis Date   Anxiety    History of kidney stones    passed   PD (Parkinson's disease) (HCC) 09/10/2013   PONV (postoperative nausea and vomiting)    nausea     Past Surgical History:  Procedure Laterality Date   COLONOSCOPY     MINOR PLACEMENT OF FIDUCIAL N/A 01/31/2017   Procedure: MINOR PLACEMENT OF FIDUCIAL;  Surgeon: [name], MD;  Location: [facility] OR;  Service: Neurosurgery;  Laterality: N/A;  Fiducial placement   none     PULSE GENERATOR IMPLANT  Right 02/17/2017   Procedure: Right implantable pulse generator placement;  Surgeon: [name], MD;  Location: [facility] OR;  Service: Neurosurgery;  Laterality: Right;  Bilateral implantable pulse generator placement   SUBTHALAMIC STIMULATOR BATTERY REPLACEMENT N/A 10/19/2018   Procedure: Deep Brain stimulator implantable pulse generator revision;  Surgeon: Levels Fairy, MD;  Location: [facility] OR;  Service: Neurosurgery;  Laterality: N/A;  Deep Brain stimulator implantable pulse generator revision   SUBTHALAMIC STIMULATOR INSERTION Bilateral 02/10/2017   Procedure: Bilateral Deep brain stimulator placement;  Surgeon: [name], MD;  Location: [facility] OR;  Service: Neurosurgery;  Laterality: Bilateral;  Bilateral Deep brain stimulator placement   UPPER GASTROINTESTINAL ENDOSCOPY      Medications: see above list.      Initial Session: Diagnosed with Parkinsons in 2014. He stutters and is trying to determine if his speech problems are anxiety/psychological or physical. He says he has had anxiety for a long time. Says he worries about things all the time. He says his mom had issues when he was growing up (addicted to pain meds) and that contributed  to his chronic worry. He does think his anxiety is better than it was in the past. He is strongly spiritual. 62 year old son and 16 year old twin daughters. No depressive symptoms reported. Went on disability in 2014. He was on Ativan , but got off of it out of fear that it would become addicting to him. Married for 32 years and good relationship. 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 That was 2006. He was 22 years in [employer]. 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 His biggest issue currently is his speech in that by the end of the day, hard for people to understand him. Speech got bad in past 2-3 years. He goes to exercise classes for Parkinson's patients. Wife doesn't work. Strong supportive church family since 2007. Plays golf weekly and works out with Systems analyst 2 times a week. Sees kids often and just had first grandchild.  FOO: Father had diabetes. Fraternal grandfather was alcoholic and spent time in jail. Mother dies of breast cancer later in life. Father died during pandemic at 15 (in 2021). He has a twin sister in [place] and they ar very close. He isn't as close to some of his guys friends lately and he thinks he pulled back due to the speech issues. Hasn't shared with everyone that he has Parkinson's. He lost his closest lifelong friend in 2021. This was 3 months after his father passed.  He has tried breathing exercises to reduce his anxiety with mild results. Going for a walk or exercise helps anxiety the best. Reports sleep is good. Told him to keep an anxiety journal with rating anxiety on a 0-10 scale. Also recommended The Anxiety and Phobia Workbook. He has become phobic about public speaking.     Goals/Treatment Plan: Patient seeking counseling to reduce above named symptoms of anxiety. He is also working on adjusting to his Parkinson's diagnosis. Hoping to improve speech  issues that are related to his anxiety and regain self-confidence. Will employ behavioral strates and insight oriented psychotherapy interventions. Goal Date is 12-25.   Diagnoses:  Generalized Anxiety Disorder  Plan of Care: Outpatient Psychotherapy  Patient was seen via video session (Caregility) and is aware of the platform limitations. He is at home and I am at office. Session Note: Patient says it  has been a very hectic three weeks. Had 65 people over for grandchild's first birthday. All went well. He also had his appointment at the [facility] about getting the speech device. Saw the speech therapist in [place] and the speech device was approved. He should have the device in the next 2 weeks. He is much less anxious since this was approved. It was hanging over his head and he is very relieved. Talked about being mindful that he still has Parkinson's and needs to be realistic about what the device can (and cannot) do for him.   CONI ALM KERNS, PhD 7:40a-8:30a 50 minutes

## 2024-07-22 ENCOUNTER — Ambulatory Visit: Admitting: Psychology

## 2024-07-31 NOTE — Progress Notes (Unsigned)
  [name] [address] [place] [address] Phone: [phone] Subjective:    I'm seeing this patient by the request  of:  [name], MD  CC:   YEP:Dlagzrupcz  Ian [name] is a 63 y.o. male coming in with complaint of back and neck pain. OMT on 06/13/2024. Patient states   Medications patient has been prescribed:   Taking:         Reviewed prior external information including notes and imaging from previsou exam, outside providers and external EMR if available.   As well as notes that were available from care everywhere and other healthcare systems.  Past medical history, social, surgical and family history all reviewed in electronic medical record.  No pertanent information unless stated regarding to the chief complaint.   Past Medical History:  Diagnosis Date   Anxiety    History of kidney stones    passed   PD (Parkinson's disease) (HCC) 09/10/2013   PONV (postoperative nausea and vomiting)    nausea     No Known Allergies   Review of Systems:  No headache, visual changes, nausea, vomiting, diarrhea, constipation, dizziness, abdominal pain, skin rash, fevers, chills, night sweats, weight loss, swollen lymph nodes, body aches, joint swelling, chest pain, shortness of breath, mood changes. POSITIVE muscle aches  Objective  There were no vitals taken for this visit.   General: No apparent distress alert and oriented x3 mood and affect normal, dressed appropriately.  HEENT: Pupils equal, extraocular movements intact  Respiratory: Patient's speak in full sentences and does not appear short of breath  Cardiovascular: No lower extremity edema, non tender, no erythema  Gait MSK:  Back   Osteopathic findings  C2 flexed rotated and side bent right C6 flexed rotated and side bent left T3 extended rotated and side bent right inhaled rib T9 extended rotated and side bent left L2 flexed rotated and side bent right Sacrum  right on right       Assessment and Plan:  No problem-specific Assessment & Plan notes found for this encounter.    Nonallopathic problems  Decision today to treat with OMT was based on Physical Exam  After verbal consent patient was treated with HVLA, ME, FPR techniques in cervical, rib, thoracic, lumbar, and sacral  areas  Patient tolerated the procedure well with improvement in symptoms  Patient given exercises, stretches and lifestyle modifications  See medications in patient instructions if given  Patient will follow up in 4-8 weeks             Note: This dictation was prepared with Dragon dictation along with smaller phrase technology. Any transcriptional errors that result from this process are unintentional.

## 2024-08-05 ENCOUNTER — Ambulatory Visit (INDEPENDENT_AMBULATORY_CARE_PROVIDER_SITE_OTHER): Admitting: Psychology

## 2024-08-05 DIAGNOSIS — F411 Generalized anxiety disorder: Secondary | ICD-10-CM | POA: Diagnosis not present

## 2024-08-05 NOTE — Progress Notes (Signed)
 [facility] Counselor Initial Adult Exam  Name: Ian [name] Date: 08/05/2024 MRN: [id number] DOB: 1961-09-27 PCP: [name], MD     [name] participated from home, via video, and consented to treatment. Therapist participated from home office.    Guardian/Payee:  N/A    Paperwork requested: No   Reason for Visit /Presenting Problem: chronic anxiety  Mental Status Exam: Appearance:   Casual     Behavior:  Appropriate  Motor:  Normal  Speech/Language:   Rapid cadence and moderate stutter  Affect:  Appropriate  Mood:  anxious  Thought process:  normal  Thought content:    WNL  Sensory/Perceptual disturbances:    WNL  Orientation:  oriented to person, place, and situation  Attention:  Good  Concentration:  Good  Memory:  WNL  Fund of knowledge:   Good  Insight:    Good  Judgment:   Good  Impulse Control:  Good    Reported Symptoms:  worry, phobia (public speaking), stutter, rate of speech  Risk Assessment: Danger to Self:  No Self-injurious Behavior: No Danger to Others: No Duty to Warn:no Physical Aggression / Violence:No  Access to Firearms a concern: unknown Gang Involvement:No  Patient / guardian was educated about steps to take if suicide or homicide risk level increases between visits: n/a While future psychiatric events cannot be accurately predicted, the patient does not currently require acute inpatient psychiatric care and does not currently meet [place]  involuntary commitment criteria.  Substance Abuse  History: Current substance abuse: No     Past Psychiatric History:   No previous psychological problems have been observed Outpatient Providers:N/A History of Psych Hospitalization: No  Psychological Testing: N/A   Abuse History:  Victim of: No., N/A   Report needed: No. Victim of Neglect:No. Perpetrator of N/A  Witness / Exposure to Domestic Violence: No   Protective Services Involvement: No  Witness to [employer] Violence:  No   Family History:  Family History  Problem Relation Age of Onset   Breast cancer Mother    Diabetes Father    Healthy Sister    Healthy Daughter    Healthy Son    Diabetes Other    Cancer Other     Living situation: the patient lives  with their spouse  Sexual Orientation: Straight  Relationship Status: married  Name of spouse / other:unknown If a parent, number of children / ages:son 12 and 25 year old twin daughters  Support Systems: spouse Nutritional therapist Stress:  No   Income/Employment/Disability: Long-Term Research officer, political party Service: Yes   Educational History: Education: Risk manager: unknown  Any cultural differences that may affect / interfere with treatment:  N/A  Recreation/Hobbies: exercise, golf  Stressors: Health problems    Strengths: Supportive Relationships, Family, and Church  Barriers:  social withdrawal   Legal History: Pending legal issue / charges: The patient has no significant history of legal issues. History of legal issue / charges: N/A  Medical History/Surgical History: reviewed Past Medical History:  Diagnosis Date   Anxiety    History of kidney stones    passed   PD (Parkinson's disease) (HCC) 09/10/2013   PONV (postoperative nausea and vomiting)    nausea     Past Surgical History:  Procedure Laterality Date   COLONOSCOPY     MINOR PLACEMENT OF FIDUCIAL N/A 01/31/2017   Procedure: MINOR PLACEMENT OF FIDUCIAL;  Surgeon:  [name], MD;  [facility] OR;  Service: Neurosurgery;  Laterality: N/A;  Fiducial placement   none     PULSE GENERATOR IMPLANT Right 02/17/2017   Procedure: Right implantable pulse generator placement;  Surgeon: [name], MD;  Location: [facility] OR;  Service: Neurosurgery;  Laterality: Right;  Bilateral implantable pulse generator placement   SUBTHALAMIC STIMULATOR BATTERY REPLACEMENT N/A 10/19/2018   Procedure: Deep Brain stimulator implantable pulse generator revision;  Surgeon: Levels Fairy, MD;  Location: [facility] OR;  Service: Neurosurgery;  Laterality: N/A;  Deep Brain stimulator implantable pulse generator revision   SUBTHALAMIC STIMULATOR INSERTION Bilateral 02/10/2017   Procedure: Bilateral Deep brain stimulator placement;  Surgeon: [name], MD;  Location: [facility] OR;  Service: Neurosurgery;  Laterality: Bilateral;  Bilateral Deep brain stimulator placement   UPPER GASTROINTESTINAL ENDOSCOPY      Medications: see above list.      Initial Session: Diagnosed with Parkinsons in 2014. He stutters and is trying to determine if his speech problems are anxiety/psychological or physical. He says he has had anxiety for a long time. Says he worries about things all the time. He says his mom had issues when he was growing up (addicted to pain meds) and that contributed to his chronic worry. He does think his anxiety is better than it was in the past. He is strongly spiritual. 62 year old son and 62 year old twin daughters. No depressive symptoms reported. Went on disability in 2014. He was on Ativan , but got off of it out of fear that it would become addicting to him. Married for 32 years and good relationship. 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 That was 2006. He was 22 years in [employer]. 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 His biggest issue currently is his speech in that by the end of the  day, hard for people to understand him. Speech got bad in past 2-3 years. He goes to exercise classes for Parkinson's patients. Wife doesn't work. Strong supportive church family since 2007. Plays golf weekly and works out with Systems analyst 2 times a week. Sees kids often and just had first grandchild.  FOO: Father had diabetes. Fraternal grandfather was alcoholic and  spent time in jail. Mother dies of breast cancer later in life. Father died during pandemic at 62 (in 2021). He has a twin sister in [place] and they ar very close. He isn't as close to some of his guys friends lately and he thinks he pulled back due to the speech issues. Hasn't shared with everyone that he has Parkinson's. He lost his closest lifelong friend in 2021. This was 3 months after his father passed.  He has tried breathing exercises to reduce his anxiety with mild results. Going for a walk or exercise helps anxiety the best. Reports sleep is good. Told him to keep an anxiety journal with rating anxiety on a 0-10 scale. Also recommended The Anxiety and Phobia Workbook. He has become phobic about public speaking.     Goals/Treatment Plan: Patient seeking counseling to reduce above named symptoms of anxiety. He is also working on adjusting to his Parkinson's diagnosis. Hoping to improve speech issues that are related to his anxiety and regain self-confidence. Will employ behavioral strates and insight oriented psychotherapy interventions. Goal Date is 12-25.   Diagnoses:  Generalized Anxiety Disorder  Plan of Care: Outpatient Psychotherapy  Patient was seen via video session (Caregility) and is aware of the platform limitations. He is at home and I am at office. Session Note: Patient says that if was a tough couple of weeks after the device adjustment. He expects to finally hear today about the Speak Easy device and hopes it will be approved. He says things went well at the [employer] anniversary. He was worried about  how he would tolerate the events, but they all went well. Used deep breathing and positive cognitions to get through. He states the meds are not wearing off as quickly with his new prescriptions (time release).  He reports that daughter shared her post partum depression symptoms with he and his wife. Concerned about her but optimistic. It is a challenge having them live in household and we discussed best ways to manage. He read chapter on feelings and found t enlightening. In particular, he was struck by his own tendency to suppress his feelings. He avoids conflict and strives to keep the peace. Expressed the challenge of articulating how he feels.                            CONI ALM KERNS, PhD 8:40a-9:30a 50 minutes                                                                                                                                                                                        [place]  [facility]al Health Counselor Initial Adult Exam  Name: Judy Goodenow Date: 08/05/2024 MRN: [id number] DOB: 04-Sep-1961 PCP: [name], MD     [name] participated from home, via video, and consented to treatment. Therapist participated from home office.    Guardian/Payee:  N/A    Paperwork requested: No   Reason for Visit /Presenting Problem: chronic anxiety  Mental Status Exam: Appearance:   Casual     Behavior:  Appropriate  Motor:  Normal  Speech/Language:   Rapid cadence and moderate stutter  Affect:  Appropriate  Mood:  anxious  Thought process:  normal  Thought content:    WNL  Sensory/Perceptual disturbances:    WNL  Orientation:  oriented to person, place, and situation  Attention:  Good  Concentration:  Good  Memory:  WNL  Fund of knowledge:   Good  Insight:    Good   Judgment:   Good  Impulse Control:  Good    Reported Symptoms:  worry, phobia (public speaking), stutter, rate of speech  Risk Assessment: Danger to Self:  No Self-injurious Behavior: No Danger to Others: No Duty to Warn:no Physical Aggression / Violence:No  Access to Firearms a concern: unknown Gang Involvement:No  Patient / guardian was educated about steps to take if suicide or homicide risk level increases between visits: n/a While future psychiatric events cannot be accurately predicted, the patient does not currently require acute inpatient psychiatric care and does not currently meet [place]  involuntary commitment criteria.  Substance Abuse History: Current substance abuse: No     Past Psychiatric History:   No previous psychological problems have been observed Outpatient Providers:N/A History of Psych Hospitalization: No  Psychological Testing: N/A   Abuse History:  Victim of: No., N/A   Report needed: No. Victim of Neglect:No. Perpetrator of N/A  Witness / Exposure to Domestic Violence: No   Protective Services Involvement: No  Witness to [employer] Violence:  No   Family History:  Family History  Problem Relation Age of Onset   Breast cancer Mother    Diabetes Father    Healthy Sister    Healthy Daughter    Healthy Son    Diabetes Other    Cancer Other     Living situation: the patient lives with their spouse  Sexual Orientation: Straight  Relationship Status: married  Name of spouse / other:unknown If a parent, number of children / ages:son 2 and 1 year old twin daughters  Support Systems: spouse Nutritional therapist Stress:  No   Income/Employment/Disability: Long-Term Research officer, political party Service: Yes   Educational History: Education: Risk manager: unknown  Any cultural differences that may affect / interfere with treatment:  N/A  Recreation/Hobbies: exercise,  golf  Stressors: Health problems    Strengths: Supportive Relationships, Family, and Church  Barriers:  social withdrawal   Legal History: Pending legal issue / charges: The patient has no significant history of legal issues. History of legal issue / charges: N/A  Medical History/Surgical History: reviewed Past Medical History:  Diagnosis Date   Anxiety    History of kidney stones    passed   PD (Parkinson's disease) (HCC) 09/10/2013   PONV (postoperative nausea and vomiting)    nausea     Past Surgical History:  Procedure Laterality Date   COLONOSCOPY     MINOR PLACEMENT OF FIDUCIAL N/A 01/31/2017   Procedure: MINOR PLACEMENT OF FIDUCIAL;  Surgeon: [name], MD;  Location: [facility] OR;  Service: Neurosurgery;  Laterality: N/A;  Fiducial placement   none     PULSE GENERATOR IMPLANT Right 02/17/2017   Procedure: Right implantable pulse generator placement;  Surgeon: [name], MD;  Location: [facility] OR;  Service: Neurosurgery;  Laterality: Right;  Bilateral implantable pulse generator placement   SUBTHALAMIC STIMULATOR BATTERY REPLACEMENT N/A 10/19/2018   Procedure: Deep Brain stimulator implantable pulse generator revision;  Surgeon: Levels Fairy, MD;  Location: [facility] OR;  Service: Neurosurgery;  Laterality: N/A;  Deep Brain stimulator implantable pulse generator revision   SUBTHALAMIC STIMULATOR INSERTION Bilateral 02/10/2017   Procedure: Bilateral Deep brain stimulator placement;  Surgeon: [name], MD;  Location: [facility] OR;  Service: Neurosurgery;  Laterality: Bilateral;  Bilateral Deep brain stimulator placement   UPPER GASTROINTESTINAL ENDOSCOPY      Medications: see above list.      Initial Session: Diagnosed with Parkinsons in 2014. He stutters and is trying to determine if his speech problems are anxiety/psychological or physical. He says he has had anxiety for a long time. Says he worries about things all the time. He says his mom had issues when he was growing up (addicted  to pain meds) and that contributed to his chronic worry. He does think his anxiety is better than it was in the past. He is strongly spiritual. 73 year old son and 62 year old twin daughters. No depressive symptoms reported. Went on disability in 2014. He was on Ativan , but got off of it out of fear that it would become addicting to him. Married for 32 years and good relationship. 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 That was 2006. He was 22 years in [employer]. 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 His biggest issue currently is his speech in that by the end of the day, hard for people to understand him. Speech got bad in past 2-3 years. He goes to exercise classes for Parkinson's patients. Wife doesn't work. Strong supportive church family since 2007. Plays golf weekly and works out with Systems analyst 2 times a week. Sees kids often and just had first grandchild.  FOO: Father had diabetes. Fraternal grandfather was alcoholic and spent time in jail. Mother dies of breast cancer later in life. Father died during pandemic at 15 (in 2021). He has a twin sister in [place] and they ar very close. He isn't as close to some of his guys friends lately and he thinks he pulled back due to the speech issues. Hasn't shared with everyone that he has Parkinson's. He lost his closest lifelong friend in 2021. This was 3 months after his father passed.  He has tried breathing exercises to reduce his anxiety with mild results. Going for a walk or exercise helps anxiety the best. Reports sleep is good. Told him to keep an anxiety journal with rating anxiety on a 0-10 scale. Also recommended The Anxiety and Phobia Workbook. He has become phobic about public speaking.     Goals/Treatment Plan: Patient seeking counseling to reduce above named symptoms of anxiety. He is also working on adjusting to his Parkinson's  diagnosis. Hoping to improve speech issues that are related to his anxiety and regain self-confidence. Will employ behavioral strates and insight oriented psychotherapy interventions. Goal Date is 12-25.   Diagnoses:  Generalized Anxiety Disorder  Plan of Care: Outpatient Psychotherapy  Patient was seen via video session (Caregility) and is aware of the platform  limitations. He is at home and I am at office. Session Note: Patient says he got the speak easy device and it is working well. He is very pleased. He says it is taking some practice, but he is learning and doing. We talked about how this will change his confidence and willingness to be more social. We talked about FOO and his mother's addiction to pain killers. He is feeling good physically and emotionally.     CONI ALM KERNS, PhD 2:15p-3:00p 45 minutes

## 2024-08-08 ENCOUNTER — Encounter: Payer: Self-pay | Admitting: Family Medicine

## 2024-08-08 ENCOUNTER — Ambulatory Visit: Admitting: Family Medicine

## 2024-08-08 VITALS — BP 104/64 | HR 90 | Ht 74.0 in | Wt 168.0 lb

## 2024-08-08 DIAGNOSIS — M9901 Segmental and somatic dysfunction of cervical region: Secondary | ICD-10-CM

## 2024-08-08 DIAGNOSIS — G20B2 Parkinson's disease with dyskinesia, with fluctuations: Secondary | ICD-10-CM | POA: Diagnosis not present

## 2024-08-08 DIAGNOSIS — M940 Chondrocostal junction syndrome [Tietze]: Secondary | ICD-10-CM

## 2024-08-08 DIAGNOSIS — M9903 Segmental and somatic dysfunction of lumbar region: Secondary | ICD-10-CM

## 2024-08-08 DIAGNOSIS — M9908 Segmental and somatic dysfunction of rib cage: Secondary | ICD-10-CM | POA: Diagnosis not present

## 2024-08-08 DIAGNOSIS — M9904 Segmental and somatic dysfunction of sacral region: Secondary | ICD-10-CM | POA: Diagnosis not present

## 2024-08-08 DIAGNOSIS — M9902 Segmental and somatic dysfunction of thoracic region: Secondary | ICD-10-CM

## 2024-08-08 NOTE — Assessment & Plan Note (Signed)
 Secondary to the Parkinson's.  Discussed with patient to continue to be active.  Patient does do a lot of cycling.  Discussed potential ergonomic changes when he is doing that that could be also helpful.  Follow-up again in 6 to 8 weeks.

## 2024-09-02 ENCOUNTER — Ambulatory Visit: Admitting: Psychology

## 2024-09-02 DIAGNOSIS — F411 Generalized anxiety disorder: Secondary | ICD-10-CM | POA: Diagnosis not present

## 2024-09-02 NOTE — Progress Notes (Signed)
 [facility] Counselor Initial Adult Exam  Name: Ian [name] Date: 09/02/2024 MRN: [id number] DOB: 08/08/1961 PCP: [name], MD     [name] participated from home, via video, and consented to treatment. Therapist participated from home office.    Guardian/Payee:  N/A    Paperwork requested: No   Reason for Visit /Presenting Problem: chronic anxiety  Mental Status Exam: Appearance:   Casual     Behavior:  Appropriate  Motor:  Normal  Speech/Language:   Rapid cadence and moderate stutter  Affect:  Appropriate  Mood:  anxious  Thought process:  normal  Thought content:    WNL  Sensory/Perceptual disturbances:    WNL  Orientation:  oriented to person, place, and situation  Attention:  Good  Concentration:  Good  Memory:  WNL  Fund of knowledge:   Good  Insight:    Good  Judgment:   Good  Impulse Control:  Good    Reported Symptoms:  worry, phobia (public speaking), stutter, rate of speech  Risk Assessment: Danger to Self:  No Self-injurious Behavior: No Danger to Others: No Duty to Warn:no Physical Aggression / Violence:No  Access to Firearms a concern: unknown Gang Involvement:No  Patient / guardian was educated about steps to take if suicide or homicide risk level increases between visits: n/a While future psychiatric events cannot be accurately predicted, the patient does not currently require acute inpatient psychiatric care and does not currently meet [place]  involuntary commitment  criteria.  Substance Abuse History: Current substance abuse: No     Past Psychiatric History:   No previous psychological problems have been observed Outpatient Providers:N/A History of Psych Hospitalization: No  Psychological Testing: N/A   Abuse History:  Victim of: No., N/A   Report needed: No. Victim of Neglect:No. Perpetrator of N/A  Witness / Exposure to Domestic Violence: No   Protective Services Involvement: No  Witness to [employer] Violence:  No   Family History:  Family History  Problem Relation Age of Onset   Breast cancer Mother    Diabetes Father    Healthy Sister    Healthy Daughter    Healthy Son    Diabetes  Other    Cancer Other     Living situation: the patient lives with their spouse  Sexual Orientation: Straight  Relationship Status: married  Name of spouse / other:unknown If a parent, number of children / ages:son 16 and 45 year old twin daughters  Support Systems: spouse Nutritional therapist Stress:  No   Income/Employment/Disability: Long-Term Research officer, political party Service: Yes   Educational History: Education: Risk manager: unknown  Any cultural differences that may affect / interfere with treatment:  N/A  Recreation/Hobbies: exercise, golf  Stressors: Health problems    Strengths: Supportive Relationships, Family, and Church  Barriers:  social withdrawal   Legal History: Pending legal issue / charges: The patient has no significant history of legal issues. History of legal issue / charges: N/A  Medical History/Surgical History: reviewed Past Medical History:  Diagnosis Date   Anxiety    History of kidney stones    passed   PD (Parkinson's disease) (HCC) 09/10/2013   PONV (postoperative nausea and vomiting)    nausea     Past Surgical History:  Procedure Laterality Date   COLONOSCOPY     MINOR PLACEMENT OF FIDUCIAL N/A 01/31/2017   Procedure: MINOR  PLACEMENT OF FIDUCIAL;  Surgeon: [name], MD;  Location: [facility] OR;  Service: Neurosurgery;  Laterality: N/A;  Fiducial placement   none     PULSE GENERATOR IMPLANT Right 02/17/2017   Procedure: Right implantable pulse generator placement;  Surgeon: [name], MD;  Location: [facility] OR;  Service: Neurosurgery;  Laterality: Right;  Bilateral implantable pulse generator placement   SUBTHALAMIC STIMULATOR BATTERY REPLACEMENT N/A 10/19/2018   Procedure: Deep Brain stimulator implantable pulse generator revision;  Surgeon: Levels Fairy, MD;  Location: [facility] OR;  Service: Neurosurgery;  Laterality: N/A;  Deep Brain stimulator implantable pulse generator revision   SUBTHALAMIC STIMULATOR INSERTION Bilateral 02/10/2017   Procedure: Bilateral Deep brain stimulator placement;  Surgeon: [name], MD;  Location: [facility] OR;  Service: Neurosurgery;  Laterality: Bilateral;  Bilateral Deep brain stimulator placement   UPPER GASTROINTESTINAL ENDOSCOPY      Medications: see above list.      Initial Session: Diagnosed with Parkinsons in 2014. He stutters and is trying to determine if his speech problems are anxiety/psychological or physical. He says he has had anxiety for a long time. Says he worries about things all the time. He says his mom had issues when he was growing up (addicted to pain meds) and that contributed to his chronic worry. He does think his anxiety is better than it was in the past. He is strongly spiritual. 62 year old son and 62 year old twin daughters. No depressive symptoms reported. Went on disability in 2014. He was on Ativan , but got off of it out of fear that it would become addicting to him. Married for 32 years and good relationship. 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 That was 2006. He was 22 years in [employer]. 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 His biggest issue currently is his  speech in that by the end of the day, hard for people to understand him. Speech got bad in past 2-3 years. He goes to exercise classes for Parkinson's patients. Wife doesn't work. Strong supportive church family since 2007. Plays golf weekly and works out with Systems analyst 2 times a week. Sees kids often  and just had first grandchild.  FOO: Father had diabetes. Fraternal grandfather was alcoholic and spent time in jail. Mother dies of breast cancer later in life. Father died during pandemic at 62 (in 2021) (in 2021). He has a twin sister in [place] and they ar very close. He isn't as close to some of his guys friends lately and he thinks he pulled back due to the speech issues. Hasn't shared with everyone that he has Parkinson's. He lost his closest lifelong friend in 2021. This was 3 months after his father passed.  He has tried breathing exercises to reduce his anxiety with mild results. Going for a walk or exercise helps anxiety the best. Reports sleep is good. Told him to keep an anxiety journal with rating anxiety on a 0-10 scale. Also recommended The Anxiety and Phobia Workbook. He has become phobic about public speaking.     Goals/Treatment Plan: Patient seeking counseling to reduce above named symptoms of anxiety. He is also working on adjusting to his Parkinson's diagnosis. Hoping to improve speech issues that are related to his anxiety and regain self-confidence. Will employ behavioral strates and insight oriented psychotherapy interventions. Goal Date is 12-25.   Diagnoses:  Generalized Anxiety Disorder  Plan of Care: Outpatient Psychotherapy  Patient was seen via video session (Caregility) and is aware of the platform limitations. He is at home and I am at office. Session Note: Patient says that if was a tough couple of weeks after the device adjustment. He expects to finally hear today about the Speak Easy device and hopes it will be approved. He says things went well at the [employer]  anniversary. He was worried about how he would tolerate the events, but they all went well. Used deep breathing and positive cognitions to get through. He states the meds are not wearing off as quickly with his new prescriptions (time release).  He reports that daughter shared her post partum depression symptoms with he and his wife. Concerned about her but optimistic. It is a challenge having them live in household and we discussed best ways to manage. He read chapter on feelings and found t enlightening. In particular, he was struck by his own tendency to suppress his feelings. He avoids conflict and strives to keep the peace. Expressed the challenge of articulating how he feels.                            CONI ALM KERNS, PhD 8:40a-9:30a 50 minutes  [facility] Counselor Initial Adult Exam  Name: Berlyn Malina Date: 09/02/2024 MRN: [id number] DOB: 17-Sep-1961 PCP: [name], MD     [name] participated from home, via video, and consented to treatment. Therapist participated from home office.    Guardian/Payee:  N/A    Paperwork requested: No   Reason for Visit /Presenting Problem: chronic anxiety  Mental Status Exam: Appearance:   Casual     Behavior:  Appropriate  Motor:  Normal  Speech/Language:   Rapid cadence and moderate stutter  Affect:  Appropriate  Mood:  anxious  Thought process:  normal  Thought content:    WNL  Sensory/Perceptual disturbances:    WNL  Orientation:  oriented to person, place, and situation  Attention:  Good  Concentration:  Good  Memory:  WNL  Fund of knowledge:    Good  Insight:    Good  Judgment:   Good  Impulse Control:  Good    Reported Symptoms:  worry, phobia (public speaking), stutter, rate of speech  Risk Assessment: Danger to Self:  No Self-injurious Behavior: No Danger to Others: No Duty to Warn:no Physical Aggression / Violence:No  Access to Firearms a concern: unknown Gang Involvement:No  Patient / guardian was educated about steps to take if suicide or homicide risk level increases between visits: n/a While future psychiatric events cannot be accurately predicted, the patient does not currently require acute inpatient psychiatric care and does not currently meet [place]  involuntary commitment criteria.  Substance Abuse History: Current substance abuse: No     Past Psychiatric History:   No previous psychological problems have been observed Outpatient Providers:N/A History of Psych Hospitalization: No  Psychological Testing: N/A   Abuse History:  Victim of: No., N/A   Report needed: No. Victim of Neglect:No. Perpetrator of N/A  Witness / Exposure to Domestic Violence: No   Protective Services Involvement: No  Witness to [employer] Violence:  No   Family History:  Family History  Problem Relation Age of Onset   Breast cancer Mother    Diabetes Father    Healthy Sister    Healthy Daughter    Healthy Son    Diabetes Other    Cancer Other     Living situation: the patient lives with their spouse  Sexual Orientation: Straight  Relationship Status: married  Name of spouse / other:unknown If a parent, number of children / ages:son 46 and 7 year old twin daughters  Support Systems: spouse Nutritional therapist Stress:  No   Income/Employment/Disability: Long-Term Research officer, political party Service: Yes   Educational History: Education: Risk manager: unknown  Any cultural differences that may affect / interfere with treatment:   N/A  Recreation/Hobbies: exercise, golf  Stressors: Health problems    Strengths: Supportive Relationships, Family, and Church  Barriers:  social withdrawal   Legal History: Pending legal issue / charges: The patient has no significant history of legal issues. History of legal issue / charges: N/A  Medical History/Surgical History: reviewed Past Medical History:  Diagnosis Date   Anxiety    History of kidney stones    passed   PD (Parkinson's disease) (HCC) 09/10/2013   PONV (postoperative nausea and vomiting)    nausea     Past Surgical History:  Procedure Laterality Date   COLONOSCOPY     MINOR PLACEMENT OF FIDUCIAL N/A 01/31/2017   Procedure: MINOR PLACEMENT OF FIDUCIAL;  Surgeon: [name], MD;  Location: [facility] OR;  Service:  Neurosurgery;  Laterality: N/A;  Fiducial placement   none     PULSE GENERATOR IMPLANT Right 02/17/2017   Procedure: Right implantable pulse generator placement;  Surgeon: [name], MD;  Location: [facility] OR;  Service: Neurosurgery;  Laterality: Right;  Bilateral implantable pulse generator placement   SUBTHALAMIC STIMULATOR BATTERY REPLACEMENT N/A 10/19/2018   Procedure: Deep Brain stimulator implantable pulse generator revision;  Surgeon: Levels Fairy, MD;  Location: [facility] OR;  Service: Neurosurgery;  Laterality: N/A;  Deep Brain stimulator implantable pulse generator revision   SUBTHALAMIC STIMULATOR INSERTION Bilateral 02/10/2017   Procedure: Bilateral Deep brain stimulator placement;  Surgeon: [name], MD;  Location: [facility] OR;  Service: Neurosurgery;  Laterality: Bilateral;  Bilateral Deep brain stimulator placement   UPPER GASTROINTESTINAL ENDOSCOPY      Medications: see above list.      Initial Session: Diagnosed with Parkinsons in 2014. He stutters and is trying to determine if his speech problems are anxiety/psychological or physical. He says he has had anxiety for a long time. Says he worries about things all the time. He says his mom had  issues when he was growing up (addicted to pain meds) and that contributed to his chronic worry. He does think his anxiety is better than it was in the past. He is strongly spiritual. 53 year old son and 18 year old twin daughters. No depressive symptoms reported. Went on disability in 2014. He was on Ativan , but got off of it out of fear that it would become addicting to him. Married for 32 years and good relationship. 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 That was 2006. He was 22 years in [employer]. 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 His biggest issue currently is his speech in that by the end of the day, hard for people to understand him. Speech got bad in past 2-3 years. He goes to exercise classes for Parkinson's patients. Wife doesn't work. Strong supportive church family since 2007. Plays golf weekly and works out with Systems analyst 2 times a week. Sees kids often and just had first grandchild.  FOO: Father had diabetes. Fraternal grandfather was alcoholic and spent time in jail. Mother dies of breast cancer later in life. Father died during pandemic at 62 (in 2021). He has a twin sister in [place] and they ar very close. He isn't as close to some of his guys friends lately and he thinks he pulled back due to the speech issues. Hasn't shared with everyone that he has Parkinson's. He lost his closest lifelong friend in 2021. This was 3 months after his father passed.  He has tried breathing exercises to reduce his anxiety with mild results. Going for a walk or exercise helps anxiety the best. Reports sleep is good. Told him to keep an anxiety journal with rating anxiety on a 0-10 scale. Also recommended The Anxiety and Phobia Workbook. He has become phobic about public speaking.     Goals/Treatment Plan: Patient seeking counseling to reduce above named symptoms of anxiety. He is also  working on adjusting to his Parkinson's diagnosis. Hoping to improve speech issues that are related to his anxiety and regain self-confidence. Will employ behavioral strates and insight oriented psychotherapy interventions. Goal Date is 12-25.   Diagnoses:  Generalized Anxiety Disorder  Plan of Care: Outpatient Psychotherapy  Patient was seen via video session (Caregility) and is aware of  the platform limitations. He is at home and I am at office. Session Note: Patient says he suffered cornea abrasion while mowing lawn. Hasn't slept well. His device continues to work well with the exception of times when he is tired or not feeling well. He is staying busy with a number of projects. His daughter is still living with them. He talked about participating in Parkinson's events and it is a mixed experience. Sometimes depressing to see how bad it can get. Discussed participating in the Parkinson's symposium on Friday. Still utilizing his relaxation strategies.           CONI ALM KERNS, PhD 7:45a-8:30a 45 minutes

## 2024-09-05 NOTE — Progress Notes (Signed)
 Assessment/Plan:   1.  Parkinsons Disease  -The patient underwent bilateral STN DBS with [employer] device on 02/10/2017 and had IPG placement on 02/17/2017.    He did have his battery changed on October 19, 2018 to an MRI compatible battery.             -continue rytary  95 mg, 2 po qid.  Discussed crexont  but I'm not sure how valuable this is to him  -continue Gocovri , 137 mg, 2 capsules at night  -now with speech easy device for stuttering    2.  Fatigue              He exercises.  He has tried Nuvigil , Provigil , selegiline  all without relief.  He finds that this seems to be one of his off symptoms.  3.  REM behavior disorder.             -On clonazepam  0.5 mg, half tablet at night.  PDMP has been reviewed.  No red flags.  This was last filled August 07, 2024. 4.  GAD  -following with Dr. Gutterman and finding it useful.  Subjective:   Ian [name] was seen today in follow up for Parkinsons disease.  My previous records were reviewed prior to todays visit as well as outside records available to me.  Last visit, we stopped his immediate release amantadine  and started him on Gocovri .  He tolerates that medication well.  We did try to adjust his DBS last visit and he had to adjusted on his own somewhat.  He has not had falls.  He continues to follow-up with Dr. Claudene for sports medicine as well as Dr. Gutterman.  The patient received a new speech device (speech easy) from the [facility] to help him with his speech clarity and stuttering, and it seems to be doing well.  In fact, I spoke to Dr. Gutterman about the patient's care and he even noticed a significant improvement.  Current prescribed movement disorder medications: [name]  95 mg, 2 tablets 4 times per day Clonazepam  0.5 mg, half tablet at night Gocovri  137 mg, 2 at night    PREVIOUS MEDICATIONS:  Requip ; Neupro (tried only a few days); propranolol helped tremor some; Inbrija  (samples given but not taken);  Provigil ; Nuvigil ; selegiline  (no help for fatigue); opicapone  (increased dyskinesia); amantadine  ; opicapone ; amantadine    ALLERGIES:  No Known Allergies  CURRENT MEDICATIONS:  Outpatient Encounter Medications as of 09/09/2024  Medication Sig   Amantadine  HCl ER (GOCOVRI ) 137 MG CP24 1 capsule at bed x 1 week then 2 capsule at bed thereafter   Amantadine  HCl ER (GOCOVRI ) 137 MG CP24 Take 2 tablets at bedtime   Ascorbic Acid  (VITAMIN C  PO) Take 3 tablets by mouth 3 (three) times daily.    b complex vitamins tablet Take 1 tablet by mouth 2 (two) times daily.   Carbidopa -Levodopa  ER ([name] ) 23.75-95 MG CPCR 2 tablets 3-4 times per day   clonazePAM  (KLONOPIN ) 0.5 MG tablet TAKE 1/2 TABLET(0.25 MG) BY MOUTH AT BEDTIME   ketoconazole  (NIZORAL ) 2 % cream Apply 1 application topically daily. To affected areas.   Melatonin 3 MG CAPS Take 3 mg by mouth at bedtime.   modafinil  (PROVIGIL ) 200 MG tablet Take 1 tablet (200 mg total) by mouth every morning.   terbinafine  (LAMISIL ) 1 % cream Apply 1 application topically 2 (two) times daily.   No facility-administered encounter medications on file as of 09/09/2024.    Objective:   PHYSICAL EXAMINATION:  VITALS:   Vitals:   09/09/24 0911  BP: 132/74  Pulse: 88  SpO2: 99%  Weight: 169 lb 9.6 oz (76.9 kg)    GEN:  The patient appears stated age and is in NAD. HEENT:  Normocephalic, atraumatic.    Neurological examination:  Orientation: The patient is alert and oriented x3. Cranial nerves: There is good facial symmetry with minimal facial hypomimia.  Extraocular muscles are intact.  The speech is fluent and with occasional stuttering, but does seem improved.  Mild dysarthria.  He is wearing his speech easy device.  Soft palate rises symmetrically and there is no tongue deviation. Hearing is intact to conversational tone. Sensation: Sensation is intact to light touch throughout Motor: Strength is at least antigravity x4  Movement  examination: Tone: There is normal tone today. Abnormal movements: There is no dyskinesia today (took [name]  about 1 hour prior to coming in). Coordination:  There is no decremation with any form of RAMS, including alternating supination and pronation of the forearm, hand opening and closing, finger taps, heel taps and toe taps bilaterally. Gait and Station: The patient is able to ambulate well.  He has good stride length.  He has good arm swing today bilaterally.  He is able to turn easily and swiftly.  This is stable from prior.  I have reviewed and interpreted the following labs independently    Chemistry      Component Value Date/Time   NA 141 10/15/2018 1533   K 4.4 10/15/2018 1533   CL 105 10/15/2018 1533   CO2 28 10/15/2018 1533   BUN 16 10/15/2018 1533   CREATININE 1.05 10/15/2018 1533   CREATININE 0.91 11/25/2014 1051      Component Value Date/Time   CALCIUM 9.7 10/15/2018 1533   ALKPHOS 69 06/10/2015 1155   AST 27 06/10/2015 1155   ALT 40 06/10/2015 1155   BILITOT 1.0 06/10/2015 1155       Lab Results  Component Value Date   WBC 6.5 10/15/2018   HGB 15.4 10/15/2018   HCT 48.2 10/15/2018   MCV 91.6 10/15/2018   PLT 302 10/15/2018    No results found for: TSH  Total time spent on today's visit was 40 minutes, including both face-to-face time and nonface-to-face time.  Time included that spent on review of records (prior notes available to me/labs/imaging if pertinent), discussing treatment and goals, answering patient's questions and coordinating care.  DBS interrogation time not included.  Cc:  [name], MD

## 2024-09-09 ENCOUNTER — Ambulatory Visit (INDEPENDENT_AMBULATORY_CARE_PROVIDER_SITE_OTHER): Admitting: Neurology

## 2024-09-09 VITALS — BP 132/74 | HR 88 | Wt 169.6 lb

## 2024-09-09 DIAGNOSIS — G20B2 Parkinson's disease with dyskinesia, with fluctuations: Secondary | ICD-10-CM

## 2024-09-09 DIAGNOSIS — Z9689 Presence of other specified functional implants: Secondary | ICD-10-CM

## 2024-09-09 DIAGNOSIS — R471 Dysarthria and anarthria: Secondary | ICD-10-CM | POA: Diagnosis not present

## 2024-09-09 DIAGNOSIS — G4752 REM sleep behavior disorder: Secondary | ICD-10-CM

## 2024-09-09 NOTE — Patient Instructions (Signed)

## 2024-09-09 NOTE — Procedures (Signed)
 DBS Programming was performed.    Manufacturer of DBS device: [employer]  Total time spent programming was 10 minutes.  Device was confirmed to be on.  Soft start was confirmed to be on.  Impedences were checked and were within normal limits.  Battery was checked and was determined to be functioning normally and not near the end of life.   Final settings were as follows:     Active Contact Amplitude (mA) PW (ms) Frequency (hz) Side Effects  Left Brain       11/23/20 3-(90%)4-(10%)C+ 3.8 (2.8-4.0) 60 130   04/26/21 3-(90%)4-(10%)C+ 3.8 60 130   11/02/21 3-(90%)4-(10%)C+ 3.8 60 130   04/11/22 3-(90%)4-(10%)C+ 3.8 60 130   10/17/22 3-(90%)4-(10%)C+ 3.8 60 130   05/11/23 3-(90%)4-(10%)C+ 3.8 60 130   11/13/23 3-(90%)4-(10%)C+ 3.7 60 130   04/15/24 prog2 3-(90%)4-(10%)C+ 3.3 60 130   09/09/24 3-(90%)4-(10%)C+ 3.5 60 130                 Right Brain       11/23/20 2-(30%)3-(70%)C+ 3.2 60 130 Tried PW of 70 but not tolerated (?dizzy)  04/26/21 2-(30%)3-(70%)C+ 3.2 60 130   11/02/21 2-(30%)3-(70%)C+ 3.1 60 130   04/11/22 2-(30%)3-(70%)C+ 3.2 60 130   10/17/22 2-(30%)3-(70%)C+ 3.2 60 130   05/11/23 2-(30%)3-(70%)C+ 3.2 60 130   11/13/23 2-(30%)3-(70%)C+ 3.3 60 130   04/15/24 2-(30%)3-(70%)C+ 3.3 60 130   09/09/24 2-(30%)3-(70%)C+ 3.3 60 130

## 2024-09-16 ENCOUNTER — Encounter: Admitting: Neurology

## 2024-09-16 ENCOUNTER — Ambulatory Visit: Admitting: Psychology

## 2024-09-18 NOTE — Progress Notes (Deleted)
 [name] [address] [place] [address] Phone: [phone] Subjective:    I'm seeing this patient by the request  of:  [name], MD  CC:   YEP:Dlagzrupcz  Ian [name] is a 63 y.o. male coming in with complaint of LBP. Patient states        Past Medical History:  Diagnosis Date   Anxiety    History of kidney stones    passed   PD (Parkinson's disease) (HCC) 09/10/2013   PONV (postoperative nausea and vomiting)    nausea    Past Surgical History:  Procedure Laterality Date   COLONOSCOPY     MINOR PLACEMENT OF FIDUCIAL N/A 01/31/2017   Procedure: MINOR PLACEMENT OF FIDUCIAL;  Surgeon: [name], MD;  Location: [facility] OR;  Service: Neurosurgery;  Laterality: N/A;  Fiducial placement   none     PULSE GENERATOR IMPLANT Right 02/17/2017   Procedure: Right implantable pulse generator placement;  Surgeon: [name], MD;  Location: [facility] OR;  Service: Neurosurgery;  Laterality: Right;  Bilateral implantable pulse generator placement   SUBTHALAMIC STIMULATOR BATTERY REPLACEMENT N/A 10/19/2018   Procedure: Deep Brain stimulator implantable pulse generator revision;  Surgeon: Levels Fairy, MD;  Location: [facility] OR;  Service: Neurosurgery;  Laterality: N/A;  Deep Brain stimulator implantable pulse generator revision   SUBTHALAMIC STIMULATOR INSERTION Bilateral 02/10/2017   Procedure: Bilateral Deep brain stimulator placement;  Surgeon: [name], MD;  Location: [facility] OR;  Service: Neurosurgery;  Laterality: Bilateral;  Bilateral Deep brain stimulator placement   UPPER GASTROINTESTINAL ENDOSCOPY     Social History   Socioeconomic History   Marital status: Married    Spouse name: Not on file   Number of children: 3   Years of education: Not on file   Highest education level: Master's degree (e.g., MA, MS, MEng, MEd, MSW, MBA)  Occupational History   Not on file  Tobacco Use   Smoking status: Never   Smokeless tobacco: Never  Vaping Use    Vaping status: Never Used  Substance and Sexual Activity   Alcohol use: Yes    Alcohol/week: 1.0 standard drink of alcohol    Types: 1 Cans of beer per week    Comment: Occ   Drug use: No   Sexual activity: Not on file  Other Topics Concern   Not on file  Social History Narrative   Right Handed   Two Story home   Drinks very little caffeine    Social Drivers of Health   Financial Resource Strain: Low Risk  (01/23/2024)   Received from [facility]   Overall Financial Resource Strain (CARDIA)    Difficulty of Paying Living Expenses: Not hard at all  Food Insecurity: No Food Insecurity (01/23/2024)   Received from [facility]   Hunger Vital Sign    Within the past 12 months, you worried that your food would run out before you got the money to buy more.: Never true    Within the past 12 months, the food you bought just didn't last and you didn't have money to get more.: Never true  Transportation Needs: No Transportation Needs (01/23/2024)   Received from [facility] - Transportation    Lack of Transportation (Medical): No    Lack of Transportation (Non-Medical): No  Physical Activity: Insufficiently Active (01/23/2024)   Received from [facility]   Exercise Vital Sign    On average, how many days per week do you engage in  moderate to strenuous exercise (like a brisk walk)?: 4 days    On average, how many minutes do you engage in exercise at this level?: 30 min  Stress: No Stress Concern Present (01/23/2024)   Received from [facility] - Occupational Stress Questionnaire    Feeling of Stress : Only a little  Social Connections: Moderately Integrated (01/23/2024)   Received from [facility]   Social Network    How would you rate your social network (family, work, friends)?: Adequate participation with social networks   No Known Allergies Family History  Problem Relation Age of Onset   Breast cancer Mother    Diabetes  Father    Healthy Sister    Healthy Daughter    Healthy Son    Diabetes Other    Cancer Other          Current Outpatient Medications (Other):    Amantadine  HCl ER (GOCOVRI ) 137 MG CP24, 1 capsule at bed x 1 week then 2 capsule at bed thereafter   Amantadine  HCl ER (GOCOVRI ) 137 MG CP24, Take 2 tablets at bedtime   Ascorbic Acid  (VITAMIN C  PO), Take 3 tablets by mouth 3 (three) times daily.    b complex vitamins tablet, Take 1 tablet by mouth 2 (two) times daily.   Carbidopa -Levodopa  ER ([name] ) 23.75-95 MG CPCR, 2 tablets 3-4 times per day   clonazePAM  (KLONOPIN ) 0.5 MG tablet, TAKE 1/2 TABLET(0.25 MG) BY MOUTH AT BEDTIME   ketoconazole  (NIZORAL ) 2 % cream, Apply 1 application topically daily. To affected areas.   Melatonin 3 MG CAPS, Take 3 mg by mouth at bedtime.   modafinil  (PROVIGIL ) 200 MG tablet, Take 1 tablet (200 mg total) by mouth every morning.   terbinafine  (LAMISIL ) 1 % cream, Apply 1 application topically 2 (two) times daily.   Reviewed prior external information including notes and imaging from  primary care provider As well as notes that were available from care everywhere and other healthcare systems.  Past medical history, social, surgical and family history all reviewed in electronic medical record.  No pertanent information unless stated regarding to the chief complaint.   Review of Systems:  No headache, visual changes, nausea, vomiting, diarrhea, constipation, dizziness, abdominal pain, skin rash, fevers, chills, night sweats, weight loss, swollen lymph nodes, body aches, joint swelling, chest pain, shortness of breath, mood changes. POSITIVE muscle aches  Objective  There were no vitals taken for this visit.   General: No apparent distress alert and oriented x3 mood and affect normal, dressed appropriately.  HEENT: Pupils equal, extraocular movements intact  Respiratory: Patient's speak in full sentences and does not appear short of breath   Cardiovascular: No lower extremity edema, non tender, no erythema      Impression and Recommendations:

## 2024-09-19 ENCOUNTER — Ambulatory Visit: Admitting: Family Medicine

## 2024-09-30 ENCOUNTER — Ambulatory Visit (INDEPENDENT_AMBULATORY_CARE_PROVIDER_SITE_OTHER): Admitting: Psychology

## 2024-09-30 DIAGNOSIS — F411 Generalized anxiety disorder: Secondary | ICD-10-CM

## 2024-09-30 NOTE — Progress Notes (Signed)
 [facility] Counselor Initial Adult Exam  Name: Ian [name] Date: 09/30/2024 MRN: [id number] DOB: [birth year] PCP: [name], MD     [name] participated from home, via video, and consented to treatment. Therapist participated from home office.    Guardian/Payee:  N/A    Paperwork requested: No   Reason for Visit /Presenting Problem: chronic anxiety  Mental Status Exam: Appearance:   Casual     Behavior:  Appropriate  Motor:  Normal  Speech/Language:   Rapid cadence and moderate stutter  Affect:  Appropriate  Mood:  anxious  Thought process:  normal  Thought content:    WNL  Sensory/Perceptual disturbances:    WNL  Orientation:  oriented to person, place, and situation  Attention:  Good  Concentration:  Good  Memory:  WNL  Fund of knowledge:   Good  Insight:    Good  Judgment:   Good  Impulse Control:  Good    Reported Symptoms:  worry, phobia (public speaking), stutter, rate of speech  Risk Assessment: Danger to Self:  No Self-injurious Behavior: No Danger to Others: No Duty to Warn:no Physical Aggression / Violence:No  Access to Firearms a concern: unknown Gang Involvement:No  Patient / guardian was educated about steps to take if suicide or homicide risk level increases between visits: n/a While future psychiatric events cannot be accurately predicted, the patient does not currently require acute inpatient psychiatric care and does not currently meet [facility]  involuntary commitment criteria.  Substance Abuse History: Current substance abuse: No     Past Psychiatric History:   No previous psychological problems have been observed Outpatient Providers:N/A History of Psych Hospitalization: No  Psychological Testing: N/A   Abuse History:  Victim of: No., N/A   Report needed: No. Victim of Neglect:No. Perpetrator of N/A  Witness / Exposure to Domestic Violence: No   Protective Services Involvement:  No  Witness to [employer] Violence:  No   Family History:  Family History  Problem Relation Age of Onset   Breast cancer Mother    Diabetes Father    Healthy Sister    Healthy Daughter    Healthy Son    Diabetes Other    Cancer Other     Living situation: the patient lives with their spouse  Sexual Orientation: Straight  Relationship Status: married  Name of spouse / other:unknown If a parent, number of children / ages:son 23 and 68 year old twin daughters  Support Systems: spouse Nutritional therapist Stress:  No   Income/Employment/Disability: Long-Term Research officer, political party Service: Yes   Educational History: Education: Risk manager: unknown  Any cultural differences that may affect / interfere with treatment:  N/A  Recreation/Hobbies: exercise, golf  Stressors: Health problems    Strengths: Supportive Relationships, Family, and Church  Barriers:  social withdrawal   Legal History: Pending legal issue / charges: The patient has no significant history of legal issues. History of legal issue / charges: N/A  Medical History/Surgical History: reviewed Past Medical History:  Diagnosis Date   Anxiety    History of kidney stones    passed   PD (Parkinson's disease) (HCC) 09/10/2013   PONV (postoperative nausea and vomiting)    nausea     Past Surgical History:  Procedure Laterality Date   COLONOSCOPY     MINOR PLACEMENT OF FIDUCIAL N/A 01/31/2017   Procedure: MINOR PLACEMENT OF FIDUCIAL;  Surgeon: [name], MD;  Location: [facility] OR;  Service: Neurosurgery;  Laterality: N/A;  Fiducial placement   none     PULSE GENERATOR IMPLANT Right 02/17/2017   Procedure: Right implantable pulse generator placement;  Surgeon: [name], MD;  Location: [facility] OR;  Service: Neurosurgery;  Laterality: Right;  Bilateral implantable pulse generator placement   SUBTHALAMIC STIMULATOR BATTERY REPLACEMENT N/A 10/19/2018    Procedure: Deep Brain stimulator implantable pulse generator revision;  Surgeon: Levels Fairy, MD;  Location: [facility] OR;  Service: Neurosurgery;  Laterality: N/A;  Deep Brain stimulator implantable pulse generator revision   SUBTHALAMIC STIMULATOR INSERTION Bilateral 02/10/2017   Procedure: Bilateral Deep brain stimulator placement;  Surgeon: [name], MD;  Location: [facility] OR;  Service: Neurosurgery;  Laterality: Bilateral;  Bilateral Deep brain stimulator placement   UPPER GASTROINTESTINAL ENDOSCOPY      Medications: see above list.      Initial Session: Diagnosed with Parkinsons in 2014. He stutters and is trying to determine if his speech problems are anxiety/psychological or physical. He says he has had anxiety for a long time. Says he worries about things all the time. He says his mom had issues when he was growing up (addicted to pain meds) and that contributed to his chronic worry. He does think his anxiety is better than it was in the past. He is strongly spiritual. 61 year old son and 58 year old twin daughters. No depressive symptoms reported. Went on disability in 2014. He was on Ativan , but got off of it out of fear that it would become addicting to him. Married for 32 years and good relationship. 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 That was 2006. He was 22 years in [employer]. 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 His biggest issue currently is his speech in that by the end of the day, hard for people to understand him. Speech got bad in past 2-3 years. He goes to exercise classes for Parkinson's patients. Wife doesn't work. Strong supportive church family since 2007. Plays golf weekly and works out with Systems analyst 2 times a week. Sees kids often and just had first grandchild.  FOO: Father had diabetes. Fraternal grandfather was alcoholic and spent time in jail. Mother dies of  breast cancer later in life. Father died during pandemic at 76 (in 2021). He has a twin sister in [place] and they ar very close. He isn't as close to some of his guys friends lately and he thinks he pulled back due to the speech issues. Hasn't shared with everyone that he has Parkinson's. He lost his closest lifelong friend in 2021. This was 3 months after his father passed.  He has tried breathing exercises to reduce his anxiety with mild results. Going for a walk or exercise helps anxiety the best. Reports sleep is good. Told him to keep an anxiety journal with rating anxiety on a 0-10 scale. Also recommended The Anxiety and Phobia Workbook. He has become phobic about public speaking.     Goals/Treatment Plan: Patient seeking counseling to reduce above named symptoms of anxiety. He is also working on adjusting to his Parkinson's diagnosis. Hoping to improve speech issues that are related to his anxiety and regain self-confidence. Will employ behavioral strates and insight oriented psychotherapy interventions. Goal Date is 12-25.   Diagnoses:  Generalized Anxiety Disorder  Plan of Care: Outpatient Psychotherapy  Patient  was seen via video session ([employer]) and is aware of the platform limitations. He is at home and I am at office. Session Note: Patient says he is tired from [employer] homecoming. States that he has had a lot of worries and is struggling with coping. This has disturbed his sleep. The speak easy device still works, but it requires him to still work at speech. It is not 100% cure. The device doesn't work as well when in a big room with a lot of noise. Talked about using relaxation to help with sleep. Will try to get up to 7 hours rather than 5-6. Tols him to keep a sleep log to get more info about variables that are dictating his sleep.              CONI ALM KERNS, PhD 7:45a-8:30a 45 minutes

## 2024-10-10 NOTE — Progress Notes (Deleted)
  [name] [address] [place] [address] Phone: [phone] Subjective:    I'm seeing this patient by the request  of:  [name], MD  CC:   YEP:Dlagzrupcz  Ian [name] is a 63 y.o. male coming in with complaint of back and neck pain. OMT on 08/12/2024. Patient states   Medications patient has been prescribed:   Taking:         Reviewed prior external information including notes and imaging from previsou exam, outside providers and external EMR if available.   As well as notes that were available from care everywhere and other healthcare systems.  Past medical history, social, surgical and family history all reviewed in electronic medical record.  No pertanent information unless stated regarding to the chief complaint.   Past Medical History:  Diagnosis Date   Anxiety    History of kidney stones    passed   PD (Parkinson's disease) (HCC) 09/10/2013   PONV (postoperative nausea and vomiting)    nausea     No Known Allergies   Review of Systems:  No headache, visual changes, nausea, vomiting, diarrhea, constipation, dizziness, abdominal pain, skin rash, fevers, chills, night sweats, weight loss, swollen lymph nodes, body aches, joint swelling, chest pain, shortness of breath, mood changes. POSITIVE muscle aches  Objective  There were no vitals taken for this visit.   General: No apparent distress alert and oriented x3 mood and affect normal, dressed appropriately.  HEENT: Pupils equal, extraocular movements intact  Respiratory: Patient's speak in full sentences and does not appear short of breath  Cardiovascular: No lower extremity edema, non tender, no erythema  Gait MSK:  Back   Osteopathic findings  C2 flexed rotated and side bent right C6 flexed rotated and side bent left T3 extended rotated and side bent right inhaled rib T9 extended rotated and side bent left L2 flexed rotated and side bent right Sacrum  right on right       Assessment and Plan:  No problem-specific Assessment & Plan notes found for this encounter.    Nonallopathic problems  Decision today to treat with OMT was based on Physical Exam  After verbal consent patient was treated with HVLA, ME, FPR techniques in cervical, rib, thoracic, lumbar, and sacral  areas  Patient tolerated the procedure well with improvement in symptoms  Patient given exercises, stretches and lifestyle modifications  See medications in patient instructions if given  Patient will follow up in 4-8 weeks             Note: This dictation was prepared with Dragon dictation along with smaller phrase technology. Any transcriptional errors that result from this process are unintentional.

## 2024-10-14 ENCOUNTER — Ambulatory Visit: Admitting: Psychology

## 2024-10-15 ENCOUNTER — Ambulatory Visit: Admitting: Psychology

## 2024-10-15 ENCOUNTER — Ambulatory Visit: Admitting: Family Medicine

## 2024-10-29 ENCOUNTER — Other Ambulatory Visit: Payer: Self-pay

## 2024-10-29 DIAGNOSIS — G20B2 Parkinson's disease with dyskinesia, with fluctuations: Secondary | ICD-10-CM

## 2024-10-29 MED ORDER — GOCOVRI 137 MG PO CP24: ORAL_CAPSULE | ORAL | 1 refills | Status: AC

## 2024-11-04 ENCOUNTER — Encounter: Payer: Self-pay | Admitting: Neurology

## 2024-11-05 MED ORDER — CLONAZEPAM 0.5 MG PO TABS: ORAL_TABLET | ORAL | 1 refills | Status: AC

## 2024-11-08 ENCOUNTER — Other Ambulatory Visit: Payer: Self-pay | Admitting: Neurology

## 2024-11-08 ENCOUNTER — Other Ambulatory Visit: Payer: Self-pay

## 2024-11-08 ENCOUNTER — Encounter: Payer: Self-pay | Admitting: Neurology

## 2024-11-08 DIAGNOSIS — G20B2 Parkinson's disease with dyskinesia, with fluctuations: Secondary | ICD-10-CM

## 2024-11-08 MED ORDER — CARBIDOPA-LEVODOPA ER 23.75-95 MG PO CPCR: ORAL_CAPSULE | ORAL | 0 refills | Status: DC

## 2024-11-11 ENCOUNTER — Other Ambulatory Visit: Payer: Self-pay

## 2024-11-11 DIAGNOSIS — G20B2 Parkinson's disease with dyskinesia, with fluctuations: Secondary | ICD-10-CM

## 2024-11-11 MED ORDER — CARBIDOPA-LEVODOPA ER 23.75-95 MG PO CPCR: ORAL_CAPSULE | ORAL | 0 refills | Status: DC

## 2024-11-14 ENCOUNTER — Other Ambulatory Visit: Payer: Self-pay | Admitting: Neurology

## 2024-11-25 ENCOUNTER — Ambulatory Visit: Admitting: Psychology

## 2024-11-25 DIAGNOSIS — F411 Generalized anxiety disorder: Secondary | ICD-10-CM

## 2024-11-25 NOTE — Progress Notes (Signed)
 [facility] Counselor Initial Adult Exam  Name: Ian [name] Date: 11/25/2024 MRN: [id number] DOB: 11/10/1961 PCP: [name], MD     [name] participated from home, via video, and consented to treatment. Therapist participated from home office.    Guardian/Payee:  N/A    Paperwork requested: No   Reason for Visit /Presenting Problem: chronic anxiety  Mental Status Exam: Appearance:   Casual     Behavior:  Appropriate  Motor:  Normal  Speech/Language:   Rapid cadence and moderate stutter  Affect:  Appropriate  Mood:  anxious  Thought process:  normal  Thought content:    WNL  Sensory/Perceptual disturbances:    WNL  Orientation:  oriented to person, place, and situation  Attention:  Good  Concentration:  Good  Memory:  WNL  Fund of knowledge:   Good  Insight:    Good  Judgment:   Good  Impulse Control:  Good    Reported Symptoms:  worry, phobia (public speaking), stutter, rate of speech  Risk Assessment: Danger to Self:  No Self-injurious Behavior: No Danger to Others: No Duty to Warn:no Physical Aggression / Violence:No  Access to Firearms a concern: unknown Gang Involvement:No  Patient / guardian was educated about steps to take if suicide or homicide risk level increases between visits: n/a While future psychiatric events cannot be accurately predicted, the patient does not currently require acute inpatient psychiatric care and does not currently meet [place]  involuntary commitment criteria.  Substance Abuse History: Current substance abuse: No     Past Psychiatric History:   No previous psychological problems have been observed Outpatient Providers:N/A History of Psych Hospitalization: No  Psychological Testing: N/A   Abuse History:  Victim of: No., N/A   Report needed: No. Victim of Neglect:No. Perpetrator of N/A  Witness / Exposure to Domestic Violence: No    Protective Services Involvement: No  Witness to [employer] Violence:  No   Family History:  Family History  Problem Relation Age of Onset   Breast cancer Mother    Diabetes Father    Healthy Sister    Healthy Daughter    Healthy Son    Diabetes Other    Cancer Other     Living situation: the patient lives with their spouse  Sexual Orientation: Straight  Relationship Status: married  Name of spouse / other:unknown If a parent, number of children / ages:son 71 and 9 year old twin daughters  Support Systems: spouse Nutritional Therapist Stress:  No   Income/Employment/Disability: Long-Term Research Officer, Political Party Service: Yes   Educational History: Education: Risk Manager: unknown  Any cultural differences that may affect / interfere with treatment:  N/A  Recreation/Hobbies: exercise, golf  Stressors: Health problems    Strengths: Supportive Relationships, Family, and Church  Barriers:  social withdrawal   Legal History: Pending legal issue / charges: The patient has no significant history of legal issues. History of legal issue / charges: N/A  Medical History/Surgical History: reviewed Past Medical History:  Diagnosis Date   Anxiety    History of kidney stones    passed   PD (Parkinson's disease) (HCC) 09/10/2013   PONV (postoperative nausea and vomiting)    nausea     Past Surgical History:  Procedure Laterality Date   COLONOSCOPY  MINOR PLACEMENT OF FIDUCIAL N/A 01/31/2017   Procedure: MINOR PLACEMENT OF FIDUCIAL;  Surgeon: [name], MD;  Location: [facility] OR;  Service: Neurosurgery;  Laterality: N/A;  Fiducial placement   none     PULSE GENERATOR IMPLANT Right 02/17/2017   Procedure: Right implantable pulse generator placement;  Surgeon: [name], MD;  Location: [facility] OR;  Service: Neurosurgery;  Laterality: Right;  Bilateral implantable pulse generator placement   SUBTHALAMIC STIMULATOR  BATTERY REPLACEMENT N/A 10/19/2018   Procedure: Deep Brain stimulator implantable pulse generator revision;  Surgeon: Levels Fairy, MD;  Location[facility] OR;  Service: Neurosurgery;  Laterality: N/A;  Deep Brain stimulator implantable pulse generator revision   SUBTHALAMIC STIMULATOR INSERTION Bilateral 02/10/2017   Procedure: Bilateral Deep brain stimulator placement;  Surgeon: [name], MD;  Location: [facility] OR;  Service: Neurosurgery;  Laterality: Bilateral;  Bilateral Deep brain stimulator placement   UPPER GASTROINTESTINAL ENDOSCOPY      Medications: see above list.      Initial Session: Diagnosed with Parkinsons in 2014. He stutters and is trying to determine if his speech problems are anxiety/psychological or physical. He says he has had anxiety for a long time. Says he worries about things all the time. He says his mom had issues when he was growing up (addicted to pain meds) and that contributed to his chronic worry. He does think his anxiety is better than it was in the past. He is strongly spiritual. 42 year old son and 22 year old twin daughters. No depressive symptoms reported. Went on disability in 2014. He was on Ativan , but got off of it out of fear that it would become addicting to him. Married for 32 years and good relationship. 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 That was 2006. He was 22 years in [employer]. He has a very strong work associate professor and now he struggles with fitting in with new lifestyle. His biggest issue currently is his speech in that by the end of the day, hard for people to understand him. Speech got bad in past 2-3 years. He goes to exercise classes for Parkinson's patients. Wife doesn't work. Strong supportive church family since 2007. Plays golf weekly and works out with systems analyst 2 times a week. Sees kids often and just had first grandchild.  FOO: Father had diabetes. Fraternal grandfather was alcoholic  and spent time in jail. Mother dies of breast cancer later in life. Father died during pandemic at 10 (in 2021). He has a twin sister in [place] and they ar very close. He isn't as close to some of his guys friends lately and he thinks he pulled back due to the speech issues. Hasn't shared with everyone that he has Parkinson's. He lost his closest lifelong friend in 2021. This was 3 months after his father passed.  He has tried breathing exercises to reduce his anxiety with mild results. Going for a walk or exercise helps anxiety the best. Reports sleep is good. Told him to keep an anxiety journal with rating anxiety on a 0-10 scale. Also recommended The Anxiety and Phobia Workbook. He has become phobic about public speaking.     Goals/Treatment Plan: Patient seeking counseling to reduce above named symptoms of anxiety. He is also working on adjusting to his Parkinson's diagnosis. Hoping to improve speech issues that are related to his anxiety and regain self-confidence. Will employ behavioral strates and insight oriented psychotherapy interventions. Goal Date is 12-26.  Diagnoses:  Generalized Anxiety Disorder  Plan of Care: Outpatient Psychotherapy  Patient was seen via video session (Caregility) and is aware of the platform limitations. He is at home and I am at office. Session Note: Patient says he had a good Thanksgiving holiday at his home. His Speak Easy device has been more challenging to use than anticipated. He is using it all the time and is slowing getting accustomed to using it. He has been very active at church preparing for the holidays. He had a friend at church pass away and it brought back memories of his own mother dying. His mother passed on Christmas Day. He has been going to a new Parkinson's workout class. He is still going to gym at least 3 days/week. He is mostly stable with anxiety and worry at a manageable level.                  CONI ALM KERNS, PhD  7:45a-8:30a 45 minutes

## 2024-12-09 ENCOUNTER — Ambulatory Visit (INDEPENDENT_AMBULATORY_CARE_PROVIDER_SITE_OTHER): Admitting: Psychology

## 2024-12-09 DIAGNOSIS — F411 Generalized anxiety disorder: Secondary | ICD-10-CM | POA: Diagnosis not present

## 2024-12-09 NOTE — Progress Notes (Signed)
 [facility] Counselor Initial Adult Exam  Name: Ian [name] Date: 12/09/2024 MRN: [id number] DOB: Apr 22, 1961 PCP: [name], MD     [name] participated from home, via video, and consented to treatment. Therapist participated from home office.    Guardian/Payee:  N/A    Paperwork requested: No   Reason for Visit /Presenting Problem: chronic anxiety  Mental Status Exam: Appearance:   Casual     Behavior:  Appropriate  Motor:  Normal  Speech/Language:   Rapid cadence and moderate stutter  Affect:  Appropriate  Mood:  anxious  Thought process:  normal  Thought content:    WNL  Sensory/Perceptual disturbances:    WNL  Orientation:  oriented to person, place, and situation  Attention:  Good  Concentration:  Good  Memory:  WNL  Fund of knowledge:   Good  Insight:    Good  Judgment:   Good  Impulse Control:  Good    Reported Symptoms:  worry, phobia (public speaking), stutter, rate of speech  Risk Assessment: Danger to Self:  No Self-injurious Behavior: No Danger to Others: No Duty to Warn:no Physical Aggression / Violence:No  Access to Firearms a concern: unknown Gang Involvement:No  Patient / guardian was educated about steps to take if suicide or homicide risk level increases between visits: n/a While future psychiatric events cannot be accurately predicted, the patient does not currently require acute inpatient psychiatric care and does not currently meet [place]  involuntary commitment criteria.  Substance Abuse History: Current substance abuse: No     Past Psychiatric History:   No previous psychological problems have been observed Outpatient Providers:N/A History of Psych Hospitalization: No  Psychological Testing: N/A   Abuse History:  Victim of: No., N/A   Report needed: No. Victim of Neglect:No. Perpetrator of N/A  Witness / Exposure  to Domestic Violence: No   Protective Services Involvement: No  Witness to [employer] Violence:  No   Family History:  Family History  Problem Relation Age of Onset   Breast cancer Mother    Diabetes Father    Healthy Sister    Healthy Daughter    Healthy Son    Diabetes Other    Cancer Other     Living situation: the patient lives with their spouse  Sexual Orientation: Straight  Relationship Status: married  Name of spouse / other:unknown If a parent, number of children / ages:son 68 and 40 year old twin daughters  Support Systems: spouse Nutritional Therapist Stress:  No   Income/Employment/Disability: Long-Term Research Officer, Political Party Service: Yes   Educational History: Education: Risk Manager: unknown  Any cultural differences that may affect / interfere with treatment:  N/A  Recreation/Hobbies: exercise, golf  Stressors: Health problems    Strengths: Supportive Relationships, Family, and Church  Barriers:  social withdrawal   Legal History: Pending legal issue / charges: The patient has no significant history of legal issues. History of legal issue / charges: N/A  Medical History/Surgical History: reviewed Past Medical History:  Diagnosis Date   Anxiety    History of kidney stones    passed   PD (Parkinson's disease) (HCC) 09/10/2013   PONV (postoperative nausea and vomiting)    nausea  Past Surgical History:  Procedure Laterality Date   COLONOSCOPY     MINOR PLACEMENT OF FIDUCIAL N/A 01/31/2017   Procedure: MINOR PLACEMENT OF FIDUCIAL;  Surgeon: [name], MD;  Location: [facility] OR;  Service: Neurosurgery;  Laterality: N/A;  Fiducial placement   none     PULSE GENERATOR IMPLANT Right 02/17/2017   Procedure: Right implantable pulse generator placement;  Surgeon: [name], MD;  Location: [facility] OR;  Service: Neurosurgery;  Laterality: Right;  Bilateral implantable pulse generator placement    SUBTHALAMIC STIMULATOR BATTERY REPLACEMENT N/A 10/19/2018   Procedure: Deep Brain stimulator implantable pulse generator revision;  Surgeon: Levels Fairy, MD;  Location: [facility] OR;  Service: Neurosurgery;  Laterality: N/A;  Deep Brain stimulator implantable pulse generator revision   SUBTHALAMIC STIMULATOR INSERTION Bilateral 02/10/2017   Procedure: Bilateral Deep brain stimulator placement;  Surgeon: [name], MD;  Location: [facility] OR;  Service: Neurosurgery;  Laterality: Bilateral;  Bilateral Deep brain stimulator placement   UPPER GASTROINTESTINAL ENDOSCOPY      Medications: see above list.      Initial Session: Diagnosed with Parkinsons in 2014. He stutters and is trying to determine if his speech problems are anxiety/psychological or physical. He says he has had anxiety for a long time. Says he worries about things all the time. He says his mom had issues when he was growing up (addicted to pain meds) and that contributed to his chronic worry. He does think his anxiety is better than it was in the past. He is strongly spiritual. 34 year old son and 26 year old twin daughters. No depressive symptoms reported. Went on disability in 2014. He was on Ativan , but got off of it out of fear that it would become addicting to him. Married for 32 years and good relationship. 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 That was 2006. He was 22 years in [employer]. He has a very strong work associate professor and now he struggles with fitting in with new lifestyle. His biggest issue currently is his speech in that by the end of the day, hard for people to understand him. Speech got bad in past 2-3 years. He goes to exercise classes for Parkinson's patients. Wife doesn't work. Strong supportive church family since 2007. Plays golf weekly and works out with systems analyst 2 times a week. Sees kids often and just had first grandchild.  FOO: Father had diabetes. Fraternal  grandfather was alcoholic and spent time in jail. Mother dies of breast cancer later in life. Father died during pandemic at 56 (in 2021). He has a twin sister in [place] and they ar very close. He isn't as close to some of his guys friends lately and he thinks he pulled back due to the speech issues. Hasn't shared with everyone that he has Parkinson's. He lost his closest lifelong friend in 2021. This was 3 months after his father passed.  He has tried breathing exercises to reduce his anxiety with mild results. Going for a walk or exercise helps anxiety the best. Reports sleep is good. Told him to keep an anxiety journal with rating anxiety on a 0-10 scale. Also recommended The Anxiety and Phobia Workbook. He has become phobic about public speaking.     Goals/Treatment Plan: Patient seeking counseling to reduce above named symptoms of anxiety. He is also working on adjusting to his Parkinson's diagnosis. Hoping to improve speech issues that are related to his anxiety and regain self-confidence.  Will employ behavioral strates and insight oriented psychotherapy interventions. Goal Date is 12-26.   Diagnoses:  Generalized Anxiety Disorder  Plan of Care: Outpatient Psychotherapy  Patient was seen via video session (Caregility) and is aware of the platform limitations. He is at home and I am at office. Session Note: Patient says he has been very busy with church. He feels he is feeling good and focused on gratitude. He does have some bad days and gets into a funk. We discussed ways he tries to get out of bad feelings. This occurs only once every few weeks.He is still doing his breathing exercises every day to help him calm down. It continues to be effective. He is also sleeping better now than before. Medically stable with no medicine changes.                    CONI ALM KERNS, PhD 7:45a-8:30a 45 minutes                                 "

## 2024-12-23 ENCOUNTER — Ambulatory Visit: Admitting: Psychology

## 2024-12-25 NOTE — Progress Notes (Signed)
 " [name] [address] [place] [address] Phone: [phone] Subjective:   Ian [name], am serving as a scribe for Dr. Arthea Claudene.  I'm seeing this patient by the request  of:  [name], MD  CC: Back and neck pain follow-up  YEP:Dlagzrupcz  Ian [name] is a 64 y.o. male coming in with complaint of back and neck pain. OMT 08/08/2024. Patient states that he is having pain in the jaw on the L side that radiates into his neck.     Medications patient has been prescribed: None  Taking:         Reviewed prior external information including notes and imaging from previsou exam, outside providers and external EMR if available.   As well as notes that were available from care everywhere and other healthcare systems.  Past medical history, social, surgical and family history all reviewed in electronic medical record.  No pertanent information unless stated regarding to the chief complaint.   Past Medical History:  Diagnosis Date   Anxiety    History of kidney stones    passed   PD (Parkinson's disease) (HCC) 09/10/2013   PONV (postoperative nausea and vomiting)    nausea     Allergies[1]   Review of Systems:  No headache, visual changes, nausea, vomiting, diarrhea, constipation, dizziness, abdominal pain, skin rash, fevers, chills, night sweats, weight loss, swollen lymph nodes, body aches, joint swelling, chest pain, shortness of breath, mood changes. POSITIVE muscle aches  Objective  Blood pressure 110/72, height 6' 2 (1.88 m), weight 167 lb (75.8 kg).   General: No apparent distress alert and oriented x3 mood and affect normal, dressed appropriately.  HEENT: Pupils equal, extraocular movements intact  Respiratory: Patient's speak in full sentences and does not appear short of breath  Cardiovascular: No lower extremity edema, non tender, no erythema  Gait MSK:  Back does have some increase in intensity of some of  the musculature especially of the neck.  Patient does have some limited sidebending to the left rotation to the right of the neck.  Negative Spurling's noted today though.  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  Discussed icing regimen and home exercises, discussed which activities to do and which ones to avoid.  Increase activity slowly.  Discussed icing regimen.  Follow-up again in 6 to 12 weeks.  Parkinson's disease with dyskinesia and fluctuating manifestations (HCC) Patient is having more fluctuation and does have a little bit more of a tremor.  Is going to start a new exercise routine that I think will be significantly beneficial for this individual.  Increase activity slowly.  Follow-up again in 6 to 12 weeks.    Nonallopathic problems  Decision today to treat with OMT was based on Physical Exam  After verbal consent patient was treated with HVLA, ME, FPR techniques in cervical, rib, thoracic, lumbar, and sacral  areas  Patient tolerated the procedure well with improvement in symptoms  Patient given exercises, stretches and lifestyle modifications  See medications in patient instructions if given  Patient will follow up in 4-8 weeks    The above documentation has been reviewed and is accurate and complete Arthea CHRISTELLA Sharps, DO          Note: This dictation was prepared with Dragon dictation along with smaller phrase technology. Any transcriptional errors that result from this process are unintentional.            [1] No Known Allergies  "

## 2024-12-26 ENCOUNTER — Encounter: Payer: Self-pay | Admitting: Family Medicine

## 2024-12-26 ENCOUNTER — Ambulatory Visit (INDEPENDENT_AMBULATORY_CARE_PROVIDER_SITE_OTHER): Admitting: Family Medicine

## 2024-12-26 VITALS — BP 110/72 | Ht 74.0 in | Wt 167.0 lb

## 2024-12-26 DIAGNOSIS — G20B2 Parkinson's disease with dyskinesia, with fluctuations: Secondary | ICD-10-CM

## 2024-12-26 DIAGNOSIS — M9904 Segmental and somatic dysfunction of sacral region: Secondary | ICD-10-CM | POA: Diagnosis not present

## 2024-12-26 DIAGNOSIS — M940 Chondrocostal junction syndrome [Tietze]: Secondary | ICD-10-CM

## 2024-12-26 DIAGNOSIS — M9903 Segmental and somatic dysfunction of lumbar region: Secondary | ICD-10-CM | POA: Diagnosis not present

## 2024-12-26 DIAGNOSIS — M9908 Segmental and somatic dysfunction of rib cage: Secondary | ICD-10-CM

## 2024-12-26 DIAGNOSIS — M9902 Segmental and somatic dysfunction of thoracic region: Secondary | ICD-10-CM | POA: Diagnosis not present

## 2024-12-26 DIAGNOSIS — M9901 Segmental and somatic dysfunction of cervical region: Secondary | ICD-10-CM

## 2024-12-26 NOTE — Assessment & Plan Note (Signed)
 Seem to be more on the left side.  Discussed icing regimen and home exercises, discussed which activities to do and which ones to avoid.  Increase activity slowly.  Discussed icing regimen.  Follow-up again in 6 to 12 weeks.

## 2024-12-26 NOTE — Assessment & Plan Note (Signed)
 Patient is having more fluctuation and does have a little bit more of a tremor.  Is going to start a new exercise routine that I think will be significantly beneficial for this individual.  Increase activity slowly.  Follow-up again in 6 to 12 weeks.

## 2024-12-26 NOTE — Patient Instructions (Signed)
 Good to see you Try to keep up with grandbaby See me in 2 months

## 2025-01-06 ENCOUNTER — Ambulatory Visit: Admitting: Psychology

## 2025-01-20 ENCOUNTER — Ambulatory Visit (INDEPENDENT_AMBULATORY_CARE_PROVIDER_SITE_OTHER): Admitting: Psychology

## 2025-01-20 DIAGNOSIS — F411 Generalized anxiety disorder: Secondary | ICD-10-CM | POA: Diagnosis not present

## 2025-01-23 ENCOUNTER — Encounter: Payer: Self-pay | Admitting: Neurology

## 2025-01-23 ENCOUNTER — Other Ambulatory Visit: Payer: Self-pay

## 2025-01-23 DIAGNOSIS — G20B2 Parkinson's disease with dyskinesia, with fluctuations: Secondary | ICD-10-CM

## 2025-01-23 MED ORDER — CARBIDOPA-LEVODOPA ER 23.75-95 MG PO CPCR: ORAL_CAPSULE | ORAL | 0 refills | Status: AC

## 2025-02-03 ENCOUNTER — Ambulatory Visit: Admitting: Psychology

## 2025-02-17 ENCOUNTER — Ambulatory Visit: Admitting: Psychology

## 2025-02-24 ENCOUNTER — Ambulatory Visit: Admitting: Family Medicine

## 2025-03-03 ENCOUNTER — Ambulatory Visit: Admitting: Psychology

## 2025-03-10 ENCOUNTER — Encounter: Admitting: Neurology

## 2025-03-17 ENCOUNTER — Ambulatory Visit: Admitting: Psychology

## 2025-03-31 ENCOUNTER — Ambulatory Visit: Admitting: Psychology

## 2025-04-14 ENCOUNTER — Ambulatory Visit: Admitting: Psychology
